# Patient Record
Sex: Male | Born: 1940 | Race: White | Hispanic: No | Marital: Married | Smoking: Former smoker
Health system: Southern US, Community
[De-identification: ages and names within clinical notes are randomized; demographics above are authoritative.]

## PROBLEM LIST (undated history)

## (undated) DIAGNOSIS — E859 Amyloidosis, unspecified: Secondary | ICD-10-CM

## (undated) DIAGNOSIS — I10 Essential (primary) hypertension: Secondary | ICD-10-CM

## (undated) DIAGNOSIS — Z7189 Other specified counseling: Secondary | ICD-10-CM

## (undated) DIAGNOSIS — C9000 Multiple myeloma not having achieved remission: Secondary | ICD-10-CM

## (undated) DIAGNOSIS — I639 Cerebral infarction, unspecified: Secondary | ICD-10-CM

## (undated) HISTORY — DX: Amyloidosis, unspecified: E85.9

## (undated) HISTORY — DX: Multiple myeloma not having achieved remission: C90.00

---
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
## 2002-08-19 DIAGNOSIS — I639 Cerebral infarction, unspecified: Secondary | ICD-10-CM

## 2002-08-19 HISTORY — DX: Cerebral infarction, unspecified: I63.9

## 2019-01-14 ENCOUNTER — Inpatient Hospital Stay (HOSPITAL_COMMUNITY)
Admission: AD | Admit: 2019-01-14 | Discharge: 2019-02-10 | DRG: 673 | Disposition: A | Discharge status: Rehabilitation Facility | Payer: Medicare Other | Source: Other Acute Inpatient Hospital | Attending: Family Medicine | Admitting: Family Medicine

## 2019-01-14 ENCOUNTER — Other Ambulatory Visit: Payer: Self-pay

## 2019-01-14 DIAGNOSIS — N179 Acute kidney failure, unspecified: Secondary | ICD-10-CM

## 2019-01-14 DIAGNOSIS — R932 Abnormal findings on diagnostic imaging of liver and biliary tract: Secondary | ICD-10-CM | POA: Diagnosis not present

## 2019-01-14 DIAGNOSIS — I953 Hypotension of hemodialysis: Secondary | ICD-10-CM | POA: Diagnosis not present

## 2019-01-14 DIAGNOSIS — K922 Gastrointestinal hemorrhage, unspecified: Secondary | ICD-10-CM

## 2019-01-14 DIAGNOSIS — Z419 Encounter for procedure for purposes other than remedying health state, unspecified: Secondary | ICD-10-CM

## 2019-01-14 DIAGNOSIS — R195 Other fecal abnormalities: Secondary | ICD-10-CM | POA: Diagnosis not present

## 2019-01-14 DIAGNOSIS — B379 Candidiasis, unspecified: Secondary | ICD-10-CM | POA: Diagnosis present

## 2019-01-14 DIAGNOSIS — K3189 Other diseases of stomach and duodenum: Secondary | ICD-10-CM | POA: Diagnosis present

## 2019-01-14 DIAGNOSIS — L8990 Pressure ulcer of unspecified site, unspecified stage: Secondary | ICD-10-CM | POA: Insufficient documentation

## 2019-01-14 DIAGNOSIS — J90 Pleural effusion, not elsewhere classified: Secondary | ICD-10-CM

## 2019-01-14 DIAGNOSIS — N2581 Secondary hyperparathyroidism of renal origin: Secondary | ICD-10-CM | POA: Diagnosis present

## 2019-01-14 DIAGNOSIS — N433 Hydrocele, unspecified: Secondary | ICD-10-CM | POA: Diagnosis present

## 2019-01-14 DIAGNOSIS — R739 Hyperglycemia, unspecified: Secondary | ICD-10-CM | POA: Diagnosis not present

## 2019-01-14 DIAGNOSIS — R571 Hypovolemic shock: Secondary | ICD-10-CM | POA: Diagnosis not present

## 2019-01-14 DIAGNOSIS — I69351 Hemiplegia and hemiparesis following cerebral infarction affecting right dominant side: Secondary | ICD-10-CM

## 2019-01-14 DIAGNOSIS — I361 Nonrheumatic tricuspid (valve) insufficiency: Secondary | ICD-10-CM | POA: Diagnosis not present

## 2019-01-14 DIAGNOSIS — Z01810 Encounter for preprocedural cardiovascular examination: Secondary | ICD-10-CM | POA: Diagnosis not present

## 2019-01-14 DIAGNOSIS — Z452 Encounter for adjustment and management of vascular access device: Secondary | ICD-10-CM

## 2019-01-14 DIAGNOSIS — T18128A Food in esophagus causing other injury, initial encounter: Secondary | ICD-10-CM | POA: Diagnosis not present

## 2019-01-14 DIAGNOSIS — X58XXXA Exposure to other specified factors, initial encounter: Secondary | ICD-10-CM | POA: Diagnosis not present

## 2019-01-14 DIAGNOSIS — D519 Vitamin B12 deficiency anemia, unspecified: Secondary | ICD-10-CM | POA: Diagnosis present

## 2019-01-14 DIAGNOSIS — K7460 Unspecified cirrhosis of liver: Secondary | ICD-10-CM | POA: Diagnosis not present

## 2019-01-14 DIAGNOSIS — I82601 Acute embolism and thrombosis of unspecified veins of right upper extremity: Secondary | ICD-10-CM | POA: Diagnosis not present

## 2019-01-14 DIAGNOSIS — I472 Ventricular tachycardia: Secondary | ICD-10-CM | POA: Diagnosis not present

## 2019-01-14 DIAGNOSIS — I471 Supraventricular tachycardia: Secondary | ICD-10-CM | POA: Diagnosis not present

## 2019-01-14 DIAGNOSIS — E8809 Other disorders of plasma-protein metabolism, not elsewhere classified: Secondary | ICD-10-CM

## 2019-01-14 DIAGNOSIS — N186 End stage renal disease: Secondary | ICD-10-CM | POA: Diagnosis not present

## 2019-01-14 DIAGNOSIS — R578 Other shock: Secondary | ICD-10-CM | POA: Diagnosis not present

## 2019-01-14 DIAGNOSIS — E559 Vitamin D deficiency, unspecified: Secondary | ICD-10-CM | POA: Diagnosis present

## 2019-01-14 DIAGNOSIS — N492 Inflammatory disorders of scrotum: Secondary | ICD-10-CM | POA: Diagnosis present

## 2019-01-14 DIAGNOSIS — I340 Nonrheumatic mitral (valve) insufficiency: Secondary | ICD-10-CM | POA: Diagnosis not present

## 2019-01-14 DIAGNOSIS — N184 Chronic kidney disease, stage 4 (severe): Secondary | ICD-10-CM | POA: Diagnosis not present

## 2019-01-14 DIAGNOSIS — D62 Acute posthemorrhagic anemia: Secondary | ICD-10-CM | POA: Diagnosis not present

## 2019-01-14 DIAGNOSIS — R778 Other specified abnormalities of plasma proteins: Secondary | ICD-10-CM

## 2019-01-14 DIAGNOSIS — I120 Hypertensive chronic kidney disease with stage 5 chronic kidney disease or end stage renal disease: Secondary | ICD-10-CM | POA: Diagnosis present

## 2019-01-14 DIAGNOSIS — R5381 Other malaise: Secondary | ICD-10-CM | POA: Diagnosis not present

## 2019-01-14 DIAGNOSIS — R579 Shock, unspecified: Secondary | ICD-10-CM

## 2019-01-14 DIAGNOSIS — E785 Hyperlipidemia, unspecified: Secondary | ICD-10-CM | POA: Diagnosis present

## 2019-01-14 DIAGNOSIS — R609 Edema, unspecified: Secondary | ICD-10-CM | POA: Diagnosis not present

## 2019-01-14 DIAGNOSIS — E876 Hypokalemia: Secondary | ICD-10-CM | POA: Diagnosis present

## 2019-01-14 DIAGNOSIS — I959 Hypotension, unspecified: Secondary | ICD-10-CM | POA: Diagnosis not present

## 2019-01-14 DIAGNOSIS — D696 Thrombocytopenia, unspecified: Secondary | ICD-10-CM | POA: Diagnosis present

## 2019-01-14 DIAGNOSIS — I2510 Atherosclerotic heart disease of native coronary artery without angina pectoris: Secondary | ICD-10-CM | POA: Diagnosis present

## 2019-01-14 DIAGNOSIS — L89621 Pressure ulcer of left heel, stage 1: Secondary | ICD-10-CM | POA: Diagnosis present

## 2019-01-14 DIAGNOSIS — E872 Acidosis: Secondary | ICD-10-CM | POA: Diagnosis present

## 2019-01-14 DIAGNOSIS — D7589 Other specified diseases of blood and blood-forming organs: Secondary | ICD-10-CM | POA: Diagnosis not present

## 2019-01-14 DIAGNOSIS — E2740 Unspecified adrenocortical insufficiency: Secondary | ICD-10-CM | POA: Diagnosis present

## 2019-01-14 DIAGNOSIS — J189 Pneumonia, unspecified organism: Secondary | ICD-10-CM | POA: Diagnosis present

## 2019-01-14 DIAGNOSIS — Z992 Dependence on renal dialysis: Secondary | ICD-10-CM

## 2019-01-14 DIAGNOSIS — M7989 Other specified soft tissue disorders: Secondary | ICD-10-CM | POA: Diagnosis not present

## 2019-01-14 DIAGNOSIS — Z20828 Contact with and (suspected) exposure to other viral communicable diseases: Secondary | ICD-10-CM | POA: Diagnosis present

## 2019-01-14 DIAGNOSIS — R0682 Tachypnea, not elsewhere classified: Secondary | ICD-10-CM | POA: Diagnosis not present

## 2019-01-14 DIAGNOSIS — Z8679 Personal history of other diseases of the circulatory system: Secondary | ICD-10-CM | POA: Diagnosis not present

## 2019-01-14 DIAGNOSIS — C9000 Multiple myeloma not having achieved remission: Secondary | ICD-10-CM | POA: Diagnosis present

## 2019-01-14 DIAGNOSIS — J9601 Acute respiratory failure with hypoxia: Secondary | ICD-10-CM

## 2019-01-14 DIAGNOSIS — N058 Unspecified nephritic syndrome with other morphologic changes: Secondary | ICD-10-CM | POA: Diagnosis not present

## 2019-01-14 DIAGNOSIS — R531 Weakness: Secondary | ICD-10-CM | POA: Diagnosis present

## 2019-01-14 DIAGNOSIS — K449 Diaphragmatic hernia without obstruction or gangrene: Secondary | ICD-10-CM | POA: Diagnosis present

## 2019-01-14 DIAGNOSIS — I82621 Acute embolism and thrombosis of deep veins of right upper extremity: Secondary | ICD-10-CM | POA: Diagnosis not present

## 2019-01-14 DIAGNOSIS — Z7189 Other specified counseling: Secondary | ICD-10-CM

## 2019-01-14 DIAGNOSIS — K921 Melena: Secondary | ICD-10-CM | POA: Diagnosis not present

## 2019-01-14 DIAGNOSIS — Z6832 Body mass index (BMI) 32.0-32.9, adult: Secondary | ICD-10-CM

## 2019-01-14 DIAGNOSIS — D72819 Decreased white blood cell count, unspecified: Secondary | ICD-10-CM | POA: Diagnosis present

## 2019-01-14 DIAGNOSIS — Z993 Dependence on wheelchair: Secondary | ICD-10-CM

## 2019-01-14 DIAGNOSIS — L89151 Pressure ulcer of sacral region, stage 1: Secondary | ICD-10-CM | POA: Diagnosis present

## 2019-01-14 DIAGNOSIS — E538 Deficiency of other specified B group vitamins: Secondary | ICD-10-CM | POA: Diagnosis present

## 2019-01-14 DIAGNOSIS — R0989 Other specified symptoms and signs involving the circulatory and respiratory systems: Secondary | ICD-10-CM | POA: Diagnosis not present

## 2019-01-14 DIAGNOSIS — Z87891 Personal history of nicotine dependence: Secondary | ICD-10-CM

## 2019-01-14 DIAGNOSIS — Z79899 Other long term (current) drug therapy: Secondary | ICD-10-CM

## 2019-01-14 DIAGNOSIS — R0600 Dyspnea, unspecified: Secondary | ICD-10-CM

## 2019-01-14 DIAGNOSIS — I6930 Unspecified sequelae of cerebral infarction: Secondary | ICD-10-CM

## 2019-01-14 DIAGNOSIS — N170 Acute kidney failure with tubular necrosis: Principal | ICD-10-CM | POA: Diagnosis present

## 2019-01-14 DIAGNOSIS — J811 Chronic pulmonary edema: Secondary | ICD-10-CM

## 2019-01-14 DIAGNOSIS — D631 Anemia in chronic kidney disease: Secondary | ICD-10-CM | POA: Diagnosis present

## 2019-01-14 DIAGNOSIS — I951 Orthostatic hypotension: Secondary | ICD-10-CM

## 2019-01-14 DIAGNOSIS — D684 Acquired coagulation factor deficiency: Secondary | ICD-10-CM | POA: Diagnosis not present

## 2019-01-14 DIAGNOSIS — Z978 Presence of other specified devices: Secondary | ICD-10-CM

## 2019-01-14 DIAGNOSIS — E669 Obesity, unspecified: Secondary | ICD-10-CM | POA: Diagnosis present

## 2019-01-14 DIAGNOSIS — R188 Other ascites: Secondary | ICD-10-CM | POA: Diagnosis present

## 2019-01-14 DIAGNOSIS — L89322 Pressure ulcer of left buttock, stage 2: Secondary | ICD-10-CM | POA: Clinically undetermined

## 2019-01-14 DIAGNOSIS — Z09 Encounter for follow-up examination after completed treatment for conditions other than malignant neoplasm: Secondary | ICD-10-CM

## 2019-01-14 DIAGNOSIS — N185 Chronic kidney disease, stage 5: Secondary | ICD-10-CM | POA: Diagnosis present

## 2019-01-14 DIAGNOSIS — R040 Epistaxis: Secondary | ICD-10-CM | POA: Diagnosis not present

## 2019-01-14 DIAGNOSIS — Z7982 Long term (current) use of aspirin: Secondary | ICD-10-CM

## 2019-01-14 DIAGNOSIS — D649 Anemia, unspecified: Secondary | ICD-10-CM | POA: Diagnosis not present

## 2019-01-14 DIAGNOSIS — L89514 Pressure ulcer of right ankle, stage 4: Secondary | ICD-10-CM | POA: Insufficient documentation

## 2019-01-14 DIAGNOSIS — T45515A Adverse effect of anticoagulants, initial encounter: Secondary | ICD-10-CM | POA: Diagnosis present

## 2019-01-14 DIAGNOSIS — E875 Hyperkalemia: Secondary | ICD-10-CM | POA: Diagnosis not present

## 2019-01-14 DIAGNOSIS — K260 Acute duodenal ulcer with hemorrhage: Secondary | ICD-10-CM | POA: Diagnosis not present

## 2019-01-14 DIAGNOSIS — Z4659 Encounter for fitting and adjustment of other gastrointestinal appliance and device: Secondary | ICD-10-CM

## 2019-01-14 DIAGNOSIS — Z8719 Personal history of other diseases of the digestive system: Secondary | ICD-10-CM | POA: Diagnosis not present

## 2019-01-14 DIAGNOSIS — T380X5A Adverse effect of glucocorticoids and synthetic analogues, initial encounter: Secondary | ICD-10-CM | POA: Diagnosis not present

## 2019-01-14 DIAGNOSIS — R198 Other specified symptoms and signs involving the digestive system and abdomen: Secondary | ICD-10-CM | POA: Diagnosis not present

## 2019-01-14 DIAGNOSIS — D692 Other nonthrombocytopenic purpura: Secondary | ICD-10-CM | POA: Diagnosis present

## 2019-01-14 DIAGNOSIS — K2970 Gastritis, unspecified, without bleeding: Secondary | ICD-10-CM | POA: Diagnosis present

## 2019-01-14 DIAGNOSIS — D539 Nutritional anemia, unspecified: Secondary | ICD-10-CM | POA: Diagnosis present

## 2019-01-14 DIAGNOSIS — L89611 Pressure ulcer of right heel, stage 1: Secondary | ICD-10-CM | POA: Diagnosis present

## 2019-01-14 DIAGNOSIS — J181 Lobar pneumonia, unspecified organism: Secondary | ICD-10-CM | POA: Diagnosis not present

## 2019-01-14 HISTORY — DX: Essential (primary) hypertension: I10

## 2019-01-15 ENCOUNTER — Inpatient Hospital Stay (HOSPITAL_COMMUNITY): Payer: Medicare Other

## 2019-01-15 ENCOUNTER — Encounter (HOSPITAL_COMMUNITY): Payer: Self-pay | Admitting: Internal Medicine

## 2019-01-15 DIAGNOSIS — I361 Nonrheumatic tricuspid (valve) insufficiency: Secondary | ICD-10-CM

## 2019-01-15 DIAGNOSIS — J181 Lobar pneumonia, unspecified organism: Secondary | ICD-10-CM

## 2019-01-15 DIAGNOSIS — R188 Other ascites: Secondary | ICD-10-CM | POA: Diagnosis present

## 2019-01-15 DIAGNOSIS — J189 Pneumonia, unspecified organism: Secondary | ICD-10-CM

## 2019-01-15 DIAGNOSIS — R778 Other specified abnormalities of plasma proteins: Secondary | ICD-10-CM

## 2019-01-15 DIAGNOSIS — I340 Nonrheumatic mitral (valve) insufficiency: Secondary | ICD-10-CM

## 2019-01-15 DIAGNOSIS — D519 Vitamin B12 deficiency anemia, unspecified: Secondary | ICD-10-CM

## 2019-01-15 DIAGNOSIS — E875 Hyperkalemia: Secondary | ICD-10-CM

## 2019-01-15 DIAGNOSIS — J90 Pleural effusion, not elsewhere classified: Secondary | ICD-10-CM | POA: Diagnosis present

## 2019-01-15 LAB — CBC
HCT: 22.5 % — ABNORMAL LOW (ref 39.0–52.0)
Hemoglobin: 7.3 g/dL — ABNORMAL LOW (ref 13.0–17.0)
MCH: 34.4 pg — ABNORMAL HIGH (ref 26.0–34.0)
MCHC: 32.4 g/dL (ref 30.0–36.0)
MCV: 106.1 fL — ABNORMAL HIGH (ref 80.0–100.0)
Platelets: 101 10*3/uL — ABNORMAL LOW (ref 150–400)
RBC: 2.12 MIL/uL — ABNORMAL LOW (ref 4.22–5.81)
RDW: 17 % — ABNORMAL HIGH (ref 11.5–15.5)
WBC: 7.2 10*3/uL (ref 4.0–10.5)
nRBC: 0.3 % — ABNORMAL HIGH (ref 0.0–0.2)

## 2019-01-15 LAB — COMPREHENSIVE METABOLIC PANEL
ALT: 23 U/L (ref 0–44)
AST: 23 U/L (ref 15–41)
Albumin: 4.1 g/dL (ref 3.5–5.0)
Alkaline Phosphatase: 63 U/L (ref 38–126)
Anion gap: 9 (ref 5–15)
BUN: 78 mg/dL — ABNORMAL HIGH (ref 8–23)
CO2: 21 mmol/L — ABNORMAL LOW (ref 22–32)
Calcium: 11 mg/dL — ABNORMAL HIGH (ref 8.9–10.3)
Chloride: 106 mmol/L (ref 98–111)
Creatinine, Ser: 2.44 mg/dL — ABNORMAL HIGH (ref 0.61–1.24)
GFR calc Af Amer: 28 mL/min — ABNORMAL LOW (ref >60)
GFR calc non Af Amer: 24 mL/min — ABNORMAL LOW (ref >60)
Glucose, Bld: 95 mg/dL (ref 70–99)
Potassium: 5.1 mmol/L (ref 3.5–5.1)
Sodium: 136 mmol/L (ref 135–145)
Total Bilirubin: 1.6 mg/dL — ABNORMAL HIGH (ref 0.3–1.2)
Total Protein: 6.4 g/dL — ABNORMAL LOW (ref 6.5–8.1)

## 2019-01-15 LAB — IRON AND TIBC
Iron: 64 ug/dL (ref 45–182)
Saturation Ratios: 20 % (ref 17.9–39.5)
TIBC: 314 ug/dL (ref 250–450)
UIBC: 250 ug/dL

## 2019-01-15 LAB — PROTEIN / CREATININE RATIO, URINE
Creatinine, Urine: 129.93 mg/dL
Protein Creatinine Ratio: 0.8 mg/mg{Cre} — ABNORMAL HIGH (ref 0.00–0.15)
Total Protein, Urine: 104 mg/dL

## 2019-01-15 LAB — TSH: TSH: 5.402 u[IU]/mL — ABNORMAL HIGH (ref 0.350–4.500)

## 2019-01-15 LAB — ECHOCARDIOGRAM COMPLETE
Height: 74 in
Weight: 3689.62 oz

## 2019-01-15 LAB — MAGNESIUM: Magnesium: 2.8 mg/dL — ABNORMAL HIGH (ref 1.7–2.4)

## 2019-01-15 LAB — SARS CORONAVIRUS 2 BY RT PCR (HOSPITAL ORDER, PERFORMED IN ~~LOC~~ HOSPITAL LAB): SARS Coronavirus 2: NEGATIVE

## 2019-01-15 LAB — SODIUM, URINE, RANDOM: Sodium, Ur: 29 mmol/L

## 2019-01-15 LAB — MRSA PCR SCREENING: MRSA by PCR: NEGATIVE

## 2019-01-15 LAB — STREP PNEUMONIAE URINARY ANTIGEN: Strep Pneumo Urinary Antigen: NEGATIVE

## 2019-01-15 LAB — FERRITIN: Ferritin: 358 ng/mL — ABNORMAL HIGH (ref 24–336)

## 2019-01-15 LAB — TROPONIN I
Troponin I: 0.06 ng/mL (ref <0.03)
Troponin I: 0.05 ng/mL (ref <0.03)

## 2019-01-15 LAB — VITAMIN B12: Vitamin B-12: 120 pg/mL — ABNORMAL LOW (ref 180–914)

## 2019-01-15 LAB — PHOSPHORUS: Phosphorus: 4.2 mg/dL (ref 2.5–4.6)

## 2019-01-15 MED ORDER — VANCOMYCIN HCL 10 G IV SOLR
2000.0000 mg | Freq: Once | INTRAVENOUS | Status: DC
  Filled 2019-01-15: qty 2000

## 2019-01-15 MED ORDER — SODIUM CHLORIDE 0.9 % IV SOLN
INTRAVENOUS | Status: AC
  Administered 2019-01-15: 02:00:00 via INTRAVENOUS

## 2019-01-15 MED ORDER — NYSTATIN 100000 UNIT/GM EX POWD
Freq: Three times a day (TID) | CUTANEOUS | Status: DC
  Administered 2019-01-15 – 2019-02-10 (×71): via TOPICAL
  Filled 2019-01-15 (×2): qty 15

## 2019-01-15 MED ORDER — CYANOCOBALAMIN 500 MCG PO TABS
250.0000 ug | ORAL_TABLET | Freq: Every day | ORAL | Status: DC
  Administered 2019-01-15 – 2019-01-20 (×6): 250 ug via ORAL
  Filled 2019-01-15 (×8): qty 1

## 2019-01-15 MED ORDER — SODIUM BICARBONATE 8.4 % IV SOLN
50.0000 meq | Freq: Once | INTRAVENOUS | Status: AC
  Administered 2019-01-15: 50 meq via INTRAVENOUS
  Filled 2019-01-15: qty 50

## 2019-01-15 MED ORDER — VANCOMYCIN VARIABLE DOSE PER UNSTABLE RENAL FUNCTION (PHARMACIST DOSING): Status: DC

## 2019-01-15 MED ORDER — SODIUM CHLORIDE 0.9 % IV SOLN
500.0000 mg | Freq: Every day | INTRAVENOUS | Status: AC
  Administered 2019-01-15 – 2019-01-17 (×3): 500 mg via INTRAVENOUS
  Filled 2019-01-15 (×3): qty 500

## 2019-01-15 MED ORDER — SODIUM CHLORIDE 0.9 % IV SOLN
2.0000 g | Freq: Two times a day (BID) | INTRAVENOUS | Status: DC
  Administered 2019-01-15 – 2019-01-16 (×3): 2 g via INTRAVENOUS
  Filled 2019-01-15 (×4): qty 2

## 2019-01-15 MED ORDER — SODIUM POLYSTYRENE SULFONATE 15 GM/60ML PO SUSP
30.0000 g | Freq: Once | ORAL | Status: AC
  Administered 2019-01-15: 02:00:00 30 g via ORAL
  Filled 2019-01-15: qty 120

## 2019-01-15 MED ORDER — SODIUM CHLORIDE 0.9 % IV SOLN
2.0000 g | Freq: Once | INTRAVENOUS | Status: DC
  Filled 2019-01-15: qty 2

## 2019-01-15 NOTE — Plan of Care (Signed)

## 2019-01-15 NOTE — Progress Notes (Signed)
Echocardiogram 2D Echocardiogram has been performed.  Matilde Bash 01/15/2019, 12:07 PM

## 2019-01-15 NOTE — Progress Notes (Signed)
PROGRESS NOTE    Scott Mcdowell  HQI:[id number] DOB: [birth year] DOA: 01/14/2019 PCP: Patient, No Pcp Per   Brief Narrative:  Scott Mcdowell  is a 78 year old gentleman with a history of CVA, hyperlipidemia and hypertension Was seen by his PCP on 01/14/2019 for scrotal swelling and was sent to the ER for further evaluation for possible cellulitis.  Patient was diagnosed with scrotal cellulitis, bilateral hydrocele, right pleural effusion and acute kidney injury And right lower lobe pneumonia/consolidation so he was transferred under hospitalist service at [facility] and was started on antibiotics.  Consultants:   None  Procedures:   None  Antimicrobials:   Cefepime and vancomycin started on 01/14/2019   Subjective: Patient seen and examined earlier today.  He denied any scrotal pain however his main concern was swelling.  He did not have any other complaint.  Denied any cough, fever, shortness of breath.  Objective: Vitals:   01/14/19 2201 01/15/19 0022 01/15/19 0616 01/15/19 1401  BP: (!) 109/57  93/63 (!) 88/66  Pulse: 91  82 91  Resp: 18  18 18   Temp: 98 F (36.7 C)  97.7 F (36.5 C) 98.1 F (36.7 C)  TempSrc: Oral  Oral Oral  SpO2: 100%  100% 96%  Weight:  104.6 kg    Height: 6\' 2"  (1.88 m)       Intake/Output Summary (Last 24 hours) at 01/15/2019 1504 Last data filed at 01/15/2019 1214 Gross per 24 hour  Intake 898.28 ml  Output 376 ml  Net 522.28 ml   Filed Weights   01/15/19 0022  Weight: 104.6 kg    Examination:  General exam: Appears calm and comfortable  Respiratory system: Diminished breath sounds in the right middle and lower lobe. Respiratory effort normal. Cardiovascular system: S1 & S2 heard, RRR. No JVD, murmurs, rubs, gallops or clicks. No pedal edema. Gastrointestinal system: Abdomen is nondistended, soft and nontender. No organomegaly or masses felt. Normal bowel sounds heard.  Scrotal edema with erythema, mostly on the  right side.  Signs of fungal infection around the scrotum. Central nervous system: Alert and oriented. No focal neurological deficits. Extremities: Symmetric 5 x 5 power. Skin: No rashes, lesions or ulcers Psychiatry: Judgement and insight appear normal. Mood & affect appropriate.    Data Reviewed: I have personally reviewed following labs and imaging studies  CBC: Recent Labs  Lab 01/15/19 0134  WBC 7.2  HGB 7.3*  HCT 22.5*  MCV 106.1*  PLT 132*   Basic Metabolic Panel: Recent Labs  Lab 01/15/19 0134  NA 136  K 5.1  CL 106  CO2 21*  GLUCOSE 95  BUN 78*  CREATININE 2.44*  CALCIUM 11.0*  MG 2.8*  PHOS 4.2   GFR: Estimated Creatinine Clearance: 31.7 mL/min (A) (by C-G formula based on SCr of 2.44 mg/dL (H)). Liver Function Tests: Recent Labs  Lab 01/15/19 0134  AST 23  ALT 23  ALKPHOS 63  BILITOT 1.6*  PROT 6.4*  ALBUMIN 4.1   No results for input(s): LIPASE, AMYLASE in the last 168 hours. No results for input(s): AMMONIA in the last 168 hours. Coagulation Profile: No results for input(s): INR, PROTIME in the last 168 hours. Cardiac Enzymes: Recent Labs  Lab 01/15/19 0134 01/15/19 1242  TROPONINI 0.06* 0.05*   BNP (last 3 results) No results for input(s): PROBNP in the last 8760 hours. HbA1C: No results for input(s): HGBA1C in the last 72 hours. CBG: No results for input(s): GLUCAP in the last 168 hours.  Lipid Profile: No results for input(s): CHOL, HDL, LDLCALC, TRIG, CHOLHDL, LDLDIRECT in the last 72 hours. Thyroid Function Tests: Recent Labs    01/15/19 0134  TSH 5.402*   Anemia Panel: Recent Labs    01/15/19 0134  VITAMINB12 120*  FERRITIN 358*  TIBC 314  IRON 64   Sepsis Labs: No results for input(s): PROCALCITON, LATICACIDVEN in the last 168 hours.  No results found for this or any previous visit (from the past 240 hour(s)).    Radiology Studies: No results found.  Scheduled Meds:  nystatin   Topical TID   vancomycin  variable dose per unstable renal function (pharmacist dosing)   Does not apply See admin instructions   vitamin B-12  250 mcg Oral Daily   Continuous Infusions:  azithromycin 500 mg (01/15/19 0236)   ceFEPime (MAXIPIME) IV 2 g (01/15/19 0618)     LOS: 1 day   Assessment & Plan:   Principal Problem:   ARF (acute renal failure) (HCC) Active Problems:   Hypercalcemia   Hyperkalemia   B12 deficiency anemia   Pleural effusion on right   Ascites   CAP (community acquired pneumonia)   Elevated troponin  Scrotal cellulitis/bilateral hydrocele: Continue cefepime and vancomycin.  Community acquired pneumonia of right lower lobe/right pleural effusion: Diagnostic and therapeutic thoracentesis has been ordered.  Per IR, patient needs COVID test done before procedure could be done.  Cover test has been ordered, waiting for the test to be done and resulted.  Continue cefepime and vancomycin in the meantime.  Inguinal/scrotal fungal/Candida infection: None statin powder 3 times daily.  Acute kidney injury: Could be due to infection.  Gentle IV hydration.  Avoiding nephrotoxic agent.  Repeat labs in the morning.  Right-sided weakness/history of stroke: Continue aspirin and Lipitor.  Macrocytic anemia due to B12 deficiency: Hemoglobin over 7.  Has B12 deficiency.  Will start on B12 supplement and repeat labs in the morning.  Elevated troponin: Slight elevation, likely due to AKI.  No signs of ACS.  Hyperkalemia: Resolved.  Hypercalcemia: Continue hydration and repeat labs in the morning.  DVT prophylaxis: SCD.  Will start on Lovenox once thoracentesis done. Code Status: Full code Family Communication: Discussed with patient Disposition Plan: To be determined pending clinical improvement   Time spent: 40 minutes   Darliss Cheney, MD [facility] Pager [phone]  If 7PM-7AM, please contact night-coverage [email or web address] Password [facility] 01/15/2019, 3:04 PM

## 2019-01-15 NOTE — H&P (Addendum)
TRH H&P    Patient Demographics:    Scott Mcdowell, is a 78 y.o. male  MRN: [id number]  DOB - 03-24-1941  Admit Date - 01/14/2019  Referring MD/NP/PA:   ? Ma Hillock  Outpatient Primary MD for the patient is Patient, No Pcp Per  Patient coming from:  [facility]  Chief complaint- ARF   HPI:    Scott Mcdowell  is a 78 y.o. male, w CVA, apparently seen by pcp today, and sent to ER for evaluation of scrotal swelling, and ? Cellulitis.  Pt notes sob for a few weeks. Pt denies fever, chills, cough, cp, palp, n/v, abd pain, diarrhea, brbpr, black stool, dysuria.   Pt went to [facility] ER and sent to [facility] for evaluation.   In ED T 98.1  P 85 Bp 101/58  Pox 95% R 18 Wt 235lbs  Wbc 7.7, Hgb 8.1, Plt 119  (+ bandemia)  Na 137, K 5.6,  Bun 80, Creatinine 2.5 Calcium 11.6 BNP 13200 Ast 30, Alt 26  Trop 0.07 (high)  EKG nsr at 80, nl axis,  Nl int, poor quality.   CT chest/abd/ pelvis Impression Moderate R pleural effusion associated with atelectasis or consolidation.  Mild left basilar scarring of atelectasis  Scrotal edema and bilateral hydroceles.  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   Extensive 3 vessel coronary artery calcifications and aortic valve calcifications .  Testicular ultrasound Impression No evidence of testisticular mass, no derate bilateral hydroceles are noted.  Extensive scrotal wall thickening, suggesting edema/ inflammation.   procalcitonin 0.07 (high)  SARS negative (cepheid)  Pt  will be admitted for CAP, w right pleural effusion, bilateral hydrocele, asciates, and hypercalcemia, and  ARF and troponin elevation.      Review of systems:    In addition to the HPI above,  No Fever-chills, No Headache, No changes with Vision or hearing, No problems swallowing food or Liquids, No Chest pain, Cough or Shortness of Breath, No Abdominal pain, No Nausea or Vomiting, + constipation No Blood in stool or Urine, No dysuria, No new skin rashes or bruises, No new joints pains-aches,  No new weakness, tingling, numbness in any extremity, No recent weight gain or loss, No polyuria, polydypsia or polyphagia, No significant Mental Stressors.  All other systems reviewed and are negative.    Past History of the following :    Past Medical History:  Diagnosis Date  . Stroke [facility])    w right sided weakness      History reviewed. No pertinent surgical history. None per patient   Social History:      Social History   Tobacco Use  . Smoking status: Former Smoker  Types: Cigarettes  . Smokeless tobacco: Never Used  Substance Use Topics  . Alcohol use: Yes    Alcohol/week: 5.0 standard drinks    Types: 5 Cans of beer per week       Family History :     Family History  Problem Relation Age of Onset  . Cancer Mother   . Parkinson's disease Father        Home Medications:   Prior to Admission medications   Medication Sig Start Date End Date Taking? Authorizing Provider  aspirin 81 MG chewable tablet Chew 81 mg by mouth daily.   Yes [provider]  atorvastatin (LIPITOR) 10 MG tablet Take 10 mg by mouth daily. 11/12/18  Yes [provider]  ramipril (ALTACE) 2.5 MG capsule Take 2.5 mg by mouth daily. 11/12/18  Yes [provider]     Allergies:    No Known Allergies   Physical Exam:   Vitals  Blood pressure (!) 109/57, pulse 91, temperature 98 F (36.7 C), temperature source Oral, resp. rate 18, height 6\' 2"  (1.88  m), weight 104.6 kg, SpO2 100 %.  1.  General: axox3  2. Psychiatric: euthymic  3. Neurologic: cn2-12 intact, reflexes 2+s ymmetric, diffuse with no clonus, right upper ext 5-/5, right lower ext 5-/5,  Pt walks with cane.  Left upper and lower ext 5/5  4. HEENMT:  Anicteric, pupils 1.1mm symmetric, direct, consensual, near intact Mucous membranes dry  5. Respiratory : Decrease bs right base, crackles left lung base  6. Cardiovascular : rrr s1, s2 2/6 sem rusb / apex  7. Gastrointestinal:  Abd: soft, nt, nd, +bs  8. Skin:  Ext: no c/c/e,  Slight redness over scrotum, (minimal), + bilateral scrotal swelling c/w hydrocele  9.Musculoskeletal:  Good ROM,  No adenopathy    Data Review:    CBC No results for input(s): WBC, HGB, HCT, PLT, MCV, MCH, MCHC, RDW, LYMPHSABS, MONOABS, EOSABS, BASOSABS, BANDABS in the last 168 hours.  Invalid input(s): NEUTRABS, BANDSABD ------------------------------------------------------------------------------------------------------------------  No results found for this or any previous visit (from the past 18 hour(s)).  Chemistries  No results for input(s): NA, K, CL, CO2, GLUCOSE, BUN, CREATININE, CALCIUM, MG, AST, ALT, ALKPHOS, BILITOT in the last 168 hours.  Invalid input(s): GFRCGP ------------------------------------------------------------------------------------------------------------------  ------------------------------------------------------------------------------------------------------------------ GFR: CrCl cannot be calculated (No successful lab value found.). Liver Function Tests: No results for input(s): AST, ALT, ALKPHOS, BILITOT, PROT, ALBUMIN in the last 168 hours. No results for input(s): LIPASE, AMYLASE in the last 168 hours. No results for input(s): AMMONIA in the last 168 hours. Coagulation Profile: No results for input(s): INR, PROTIME in the last 168 hours. Cardiac Enzymes: No results for input(s): CKTOTAL,  CKMB, CKMBINDEX, TROPONINI in the last 168 hours. BNP (last 3 results) No results for input(s): PROBNP in the last 8760 hours. HbA1C: No results for input(s): HGBA1C in the last 72 hours. CBG: No results for input(s): GLUCAP in the last 168 hours. Lipid Profile: No results for input(s): CHOL, HDL, LDLCALC, TRIG, CHOLHDL, LDLDIRECT in the last 72 hours. Thyroid Function Tests: No results for input(s): TSH, T4TOTAL, FREET4, T3FREE, THYROIDAB in the last 72 hours. Anemia Panel: No results for input(s): VITAMINB12, FOLATE, FERRITIN, TIBC, IRON, RETICCTPCT in the last 72 hours.  --------------------------------------------------------------------------------------------------------------- Urine analysis: No results found for: COLORURINE, APPEARANCEUR, LABSPEC, PHURINE, GLUCOSEU, HGBUR, BILIRUBINUR, KETONESUR, PROTEINUR, UROBILINOGEN, NITRITE, LEUKOCYTESUR    Imaging Results:    No results found.     Assessment & Plan:    Principal  Problem:   ARF (acute renal failure) (HCC) Active Problems:   Hypercalcemia   Hyperkalemia   Anemia   Pleural effusion   Ascites   CAP Blood culture x2 Sputum gram stain culture Check urine legionella, urine strep antigen Start on vanco, cefepime, iv pharmacy to dose,  zithromax 500mg  iv qday Cont O2 [place] to maintain o2 sat>90%  R pleural effusion Ultrasound guided thoracentesis  ARF Check urine sodium, urine creatinine urine eosinophils STOP Ramipril Hydrate with ns IV  Hypercalcemia Check magnesium, phos, vitamin d 25-oh, vitamin d 1,25-oh,  Pth, Pth rp,  Spep, immunofixation  Anemia Check iron, tibc, ferritin, b12, folate, tsh, spep, upep Check cbc in am  Hyperkalemia No Calcium gluconate due to hypercalcemia Sodium bicarbonate Kayexalate 30gm po x1,  Risk and benefit discussed with patient.  Check cmp in am  Ascites, unclear etiology  H/o Stroke with R sided weakness Cont Aspirin  Cont Lipitor  Elevated troponin ? Renal  insufficiency Pt probably has underlying coronary disease as evinced by coronary artery calcifications on CT scan Tele Trop I q6hx3 Check cardiac echo Consider cardiology consultation  Bilateral hydrocele Consider urology consultation in am vs outpatient followup     DVT Prophylaxis-   Lovenox - SCDs   AM Labs Ordered, also please review Full Orders  Family Communication: Admission, patients condition and plan of care including tests being ordered have been discussed with the patient  who indicate understanding and agree with the plan and Code Status.  Code Status:  FULL CODE  Admission status: Inpatient: Based on patients clinical presentation and evaluation of above clinical data, I have made determination that patient meets Inpatient criteria at this time.  Pt will require iv abx for CAP as well as cellulitis of scrotum.  Pt requires admission and inpatient stay due to inability to maintain his o2 sat >90% without o2  as well as tachypnea, and age.  Pt also requires thoracentesis / evaluation of pleural effusion. This will require > 2 nites stay and inpatient status.   Time spent in minutes : 70   Jani Gravel M.D on 01/15/2019 at 12:41 AM

## 2019-01-15 NOTE — Progress Notes (Signed)
Pharmacy Antibiotic Note  Scott Mcdowell is a 78 y.o. male admitted on 01/14/2019 with pneumonia, cellulitis, right pleural effesion  Pharmacy has been consulted for cefepime and vancomycin dosing.  0145 called [facility] and spoke the RPh who confirmed patient received Cefepime 2 Gm @ 1216 and Vancomycin 2 Gm @ 1233.  His scr was 2.5.    Plan: Cefepime 2 Gm IV q12h Zmax 500 mg IV q24h Vancomycin 2 Gm x1 (at [place])  F/u scr/cultures/levels  Height: 6\' 2"  (188 cm) Weight: 230 lb 9.6 oz (104.6 kg) IBW/kg (Calculated) : 82.2  Temp (24hrs), Avg:98 F (36.7 C), Min:98 F (36.7 C), Max:98 F (36.7 C)  No results for input(s): WBC, CREATININE, LATICACIDVEN, VANCOTROUGH, VANCOPEAK, VANCORANDOM, GENTTROUGH, GENTPEAK, GENTRANDOM, TOBRATROUGH, TOBRAPEAK, TOBRARND, AMIKACINPEAK, AMIKACINTROU, AMIKACIN in the last 168 hours.  CrCl cannot be calculated (No successful lab value found.).    No Known Allergies  Antimicrobials this admission: 5/28 cefepime >>  5/28 vancomycin >>  5/29 zmax >>  Dose adjustments this admission:   Microbiology results:  BCx:   UCx:    Sputum:    MRSA PCR:   Thank you for allowing pharmacy to be a part of this patient's care.  Dorrene German 01/15/2019 1:46 AM

## 2019-01-16 ENCOUNTER — Inpatient Hospital Stay (HOSPITAL_COMMUNITY): Payer: Medicare Other

## 2019-01-16 LAB — BODY FLUID CELL COUNT WITH DIFFERENTIAL
Lymphs, Fluid: 43 %
Monocyte-Macrophage-Serous Fluid: 38 % — ABNORMAL LOW (ref 50–90)
Neutrophil Count, Fluid: 18 % (ref 0–25)
Total Nucleated Cell Count, Fluid: 217 cu mm (ref 0–1000)

## 2019-01-16 LAB — CBC WITH DIFFERENTIAL/PLATELET
Abs Immature Granulocytes: 0.47 10*3/uL — ABNORMAL HIGH (ref 0.00–0.07)
Basophils Absolute: 0 10*3/uL (ref 0.0–0.1)
Basophils Relative: 0 %
Eosinophils Absolute: 0.1 10*3/uL (ref 0.0–0.5)
Eosinophils Relative: 1 %
HCT: 22.2 % — ABNORMAL LOW (ref 39.0–52.0)
Hemoglobin: 6.9 g/dL — CL (ref 13.0–17.0)
Immature Granulocytes: 7 %
Lymphocytes Relative: 17 %
Lymphs Abs: 1.3 10*3/uL (ref 0.7–4.0)
MCH: 33.2 pg (ref 26.0–34.0)
MCHC: 31.1 g/dL (ref 30.0–36.0)
MCV: 106.7 fL — ABNORMAL HIGH (ref 80.0–100.0)
Monocytes Absolute: 0.9 10*3/uL (ref 0.1–1.0)
Monocytes Relative: 12 %
Neutro Abs: 4.6 10*3/uL (ref 1.7–7.7)
Neutrophils Relative %: 63 %
Platelets: 105 10*3/uL — ABNORMAL LOW (ref 150–400)
RBC: 2.08 MIL/uL — ABNORMAL LOW (ref 4.22–5.81)
RDW: 17.2 % — ABNORMAL HIGH (ref 11.5–15.5)
WBC: 7.3 10*3/uL (ref 4.0–10.5)
nRBC: 0.6 % — ABNORMAL HIGH (ref 0.0–0.2)

## 2019-01-16 LAB — RESPIRATORY PANEL BY PCR

## 2019-01-16 LAB — LACTATE DEHYDROGENASE, PLEURAL OR PERITONEAL FLUID: LD, Fluid: 88 U/L — ABNORMAL HIGH (ref 3–23)

## 2019-01-16 LAB — TROPONIN I
Troponin I: 0.07 ng/mL (ref <0.03)
Troponin I: 0.06 ng/mL (ref <0.03)

## 2019-01-16 LAB — ALBUMIN, PLEURAL OR PERITONEAL FLUID: Albumin, Fluid: 2 g/dL

## 2019-01-16 LAB — VANCOMYCIN, RANDOM: Vancomycin Rm: 13

## 2019-01-16 LAB — PREPARE RBC (CROSSMATCH)

## 2019-01-16 LAB — COMPREHENSIVE METABOLIC PANEL
ALT: 23 U/L (ref 0–44)
AST: 25 U/L (ref 15–41)
Albumin: 3.9 g/dL (ref 3.5–5.0)
Alkaline Phosphatase: 59 U/L (ref 38–126)
Anion gap: 9 (ref 5–15)
BUN: 82 mg/dL — ABNORMAL HIGH (ref 8–23)
CO2: 22 mmol/L (ref 22–32)
Calcium: 11.1 mg/dL — ABNORMAL HIGH (ref 8.9–10.3)
Chloride: 107 mmol/L (ref 98–111)
Creatinine, Ser: 2.61 mg/dL — ABNORMAL HIGH (ref 0.61–1.24)
GFR calc Af Amer: 26 mL/min — ABNORMAL LOW (ref >60)
GFR calc non Af Amer: 22 mL/min — ABNORMAL LOW (ref >60)
Glucose, Bld: 145 mg/dL — ABNORMAL HIGH (ref 70–99)
Potassium: 4.3 mmol/L (ref 3.5–5.1)
Sodium: 138 mmol/L (ref 135–145)
Total Bilirubin: 1.2 mg/dL (ref 0.3–1.2)
Total Protein: 6.4 g/dL — ABNORMAL LOW (ref 6.5–8.1)

## 2019-01-16 LAB — GLUCOSE, PLEURAL OR PERITONEAL FLUID: Glucose, Fluid: 122 mg/dL

## 2019-01-16 LAB — PROTEIN, PLEURAL OR PERITONEAL FLUID: Total protein, fluid: <3 g/dL

## 2019-01-16 LAB — HIV ANTIBODY (ROUTINE TESTING W REFLEX): HIV Screen 4th Generation wRfx: NONREACTIVE

## 2019-01-16 LAB — PARATHYROID HORMONE, INTACT (NO CA): PTH: 8 pg/mL — ABNORMAL LOW (ref 15–65)

## 2019-01-16 LAB — MAGNESIUM: Magnesium: 2.8 mg/dL — ABNORMAL HIGH (ref 1.7–2.4)

## 2019-01-16 LAB — ABO/RH: ABO/RH(D): O POS

## 2019-01-16 LAB — VITAMIN D 25 HYDROXY (VIT D DEFICIENCY, FRACTURES): Vit D, 25-Hydroxy: 12 ng/mL — ABNORMAL LOW (ref 30.0–100.0)

## 2019-01-16 MED ORDER — ASPIRIN 81 MG PO CHEW
81.0000 mg | CHEWABLE_TABLET | Freq: Every day | ORAL | Status: DC
  Administered 2019-01-16 – 2019-01-20 (×5): 81 mg via ORAL
  Filled 2019-01-16 (×5): qty 1

## 2019-01-16 MED ORDER — SODIUM CHLORIDE 0.9% IV SOLUTION
Freq: Once | INTRAVENOUS | Status: AC
  Administered 2019-01-16: 23:00:00 via INTRAVENOUS

## 2019-01-16 MED ORDER — SODIUM CHLORIDE 0.9% IV SOLUTION: Freq: Once | INTRAVENOUS | Status: DC

## 2019-01-16 MED ORDER — VANCOMYCIN HCL 10 G IV SOLR
2000.0000 mg | INTRAVENOUS | Status: DC
  Administered 2019-01-16: 2000 mg via INTRAVENOUS
  Filled 2019-01-16 (×2): qty 2000

## 2019-01-16 MED ORDER — ATORVASTATIN CALCIUM 10 MG PO TABS
10.0000 mg | ORAL_TABLET | Freq: Every day | ORAL | Status: DC
  Administered 2019-01-16 – 2019-01-20 (×5): 10 mg via ORAL
  Filled 2019-01-16 (×5): qty 1

## 2019-01-16 MED ORDER — LIDOCAINE HCL 1 % IJ SOLN
INTRAMUSCULAR | Status: AC
  Filled 2019-01-16: qty 10

## 2019-01-16 MED ORDER — SODIUM CHLORIDE 0.9 % IV SOLN
2.0000 g | INTRAVENOUS | Status: DC
  Administered 2019-01-17: 2 g via INTRAVENOUS
  Filled 2019-01-16: qty 2

## 2019-01-16 NOTE — Progress Notes (Addendum)
Pharmacy Antibiotic Note  Scott Mcdowell is a 78 y.o. male admitted on 01/14/2019 with pneumonia, cellulitis, right pleural effusion  Pharmacy has been consulted for cefepime and vancomycin dosing. RPh called [facility] and spoke the [place] who confirmed patient received Cefepime 2 Gm @ 1216 and Vancomycin 2 Gm @ 1233 on 5/28.  His scr was 2.5.    Today, 01/16/19  Day 2 Abxs  Afebrile  Random Vanc level 13 at 0552 - last dose of 2g given 5/28 at 1233 - ~42 hours between dose and level  Plan: 1) Change cefepime from 2g IV q12 to 2g IV q24 for increased SCr CrCl 29.6 ml/min 2) Start vancomycin 2g IV q24 - goal AUC 400-550 3) F/u scr/cultures/levels  Height: 6\' 2"  (188 cm) Weight: 230 lb 9.6 oz (104.6 kg) IBW/kg (Calculated) : 82.2  Temp (24hrs), Avg:98.1 F (36.7 C), Min:97.9 F (36.6 C), Max:98.4 F (36.9 C)  Recent Labs  Lab 01/15/19 0134 01/16/19 0552  WBC 7.2  --   CREATININE 2.44*  --   VANCORANDOM  --  13    Estimated Creatinine Clearance: 31.7 mL/min (A) (by C-G formula based on SCr of 2.44 mg/dL (H)).    No Known Allergies  Antimicrobials this admission: 5/28 cefepime >>  5/28 vancomycin >>  5/29 zmax >> 5/31  Dose adjustments this admission:   Microbiology results: 5/29 BCx: sent 5/29 MRSA PCR: negative 5/29 SARS2: negative  Thank you for allowing pharmacy to be a part of this patient's care.  Kara Mead 01/16/2019 9:03 AM

## 2019-01-16 NOTE — Progress Notes (Signed)
Text paged MD re. Patient hgb level of 6.9

## 2019-01-16 NOTE — Procedures (Signed)
PROCEDURE SUMMARY:  Successful US guided right thoracentesis. Yielded 600 mL of clear yellow fluid. Patient tolerated procedure well. No immediate complications. EBL = trace  Specimen was sent for labs.  Post procedure chest X-ray reveals no pneumothorax  WENDY S BLAIR PA-C 01/16/2019 2:55 PM

## 2019-01-16 NOTE — Progress Notes (Addendum)
PROGRESS NOTE    ARCH METHOT  FMB:[id number] DOB: 07/28/1941 DOA: 01/14/2019 PCP: Patient, No Pcp Per   Brief Narrative:  Scott Mcdowell  is a 78 year old gentleman with a history of CVA, hyperlipidemia and hypertension Was seen by his PCP on 01/14/2019 for scrotal swelling and was sent to the ER for further evaluation for possible cellulitis.  Patient was diagnosed with scrotal cellulitis, bilateral hydrocele, right pleural effusion and acute kidney injury And right lower lobe pneumonia/consolidation so he was transferred under hospitalist service at [facility] and was started on antibiotics.  Underwent diagnostic and therapeutic thoracentesis on 01/16/2019.  Dropped his hemoglobin to 6.9.  Will receive 1 unit today.  Consultants:   None  Procedures:   None  Antimicrobials:   Cefepime and vancomycin started on 01/14/2019   Subjective: Patient seen and examined earlier today.  He stated that he is feeling much better.  His scrotal swelling is improved according to him.  Denied any shortness of breath or chest pain or any other complaint.  Objective: Vitals:   01/16/19 1319 01/16/19 1350 01/16/19 1355 01/16/19 1400  BP: (!) 99/59 99/64 102/85 (!) 92/59  Pulse: 83     Resp: 20     Temp: 97.7 F (36.5 C)     TempSrc: Oral     SpO2: 97%     Weight:      Height:        Intake/Output Summary (Last 24 hours) at 01/16/2019 1503 Last data filed at 01/16/2019 1240 Gross per 24 hour  Intake 1522.8 ml  Output 400 ml  Net 1122.8 ml   Filed Weights   01/15/19 0022  Weight: 104.6 kg    Examination:   General exam: Appears calm and comfortable with some dysarthria at baseline Respiratory system: Clear to auscultation. Respiratory effort normal. Cardiovascular system: S1 & S2 heard, RRR. No JVD, murmurs, rubs, gallops or clicks. No pedal edema. Gastrointestinal system: Abdomen is nondistended, soft and nontender. No organomegaly or masses felt. Normal  bowel sounds heard.  Scrotal edema with mild erythema on the right side.  Signs of fungal/candidal infection in the groin. Central nervous system: Alert and oriented. No focal neurological deficits. Extremities: Symmetric 5 x 5 power. Skin: No rashes, lesions or ulcers Psychiatry: Judgement and insight appear poor. mood & affect appropriate.  Data Reviewed: I have personally reviewed following labs and imaging studies  CBC: Recent Labs  Lab 01/15/19 0134 01/16/19 0956  WBC 7.2 7.3  NEUTROABS  --  4.6  HGB 7.3* 6.9*  HCT 22.5* 22.2*  MCV 106.1* 106.7*  PLT 101* 701*   Basic Metabolic Panel: Recent Labs  Lab 01/15/19 0134 01/16/19 0956  NA 136 138  K 5.1 4.3  CL 106 107  CO2 21* 22  GLUCOSE 95 145*  BUN 78* 82*  CREATININE 2.44* 2.61*  CALCIUM 11.0* 11.1*  MG 2.8* 2.8*  PHOS 4.2  --    GFR: Estimated Creatinine Clearance: 29.6 mL/min (A) (by C-G formula based on SCr of 2.61 mg/dL (H)). Liver Function Tests: Recent Labs  Lab 01/15/19 0134 01/16/19 0956  AST 23 25  ALT 23 23  ALKPHOS 63 59  BILITOT 1.6* 1.2  PROT 6.4* 6.4*  ALBUMIN 4.1 3.9   No results for input(s): LIPASE, AMYLASE in the last 168 hours. No results for input(s): AMMONIA in the last 168 hours. Coagulation Profile: No results for input(s): INR, PROTIME in the last 168 hours. Cardiac Enzymes: Recent Labs  Lab 01/15/19 0134  01/15/19 1242 01/16/19 0956  TROPONINI 0.06* 0.05* 0.07*   BNP (last 3 results) No results for input(s): PROBNP in the last 8760 hours. HbA1C: No results for input(s): HGBA1C in the last 72 hours. CBG: No results for input(s): GLUCAP in the last 168 hours. Lipid Profile: No results for input(s): CHOL, HDL, LDLCALC, TRIG, CHOLHDL, LDLDIRECT in the last 72 hours. Thyroid Function Tests: Recent Labs    01/15/19 0134  TSH 5.402*   Anemia Panel: Recent Labs    01/15/19 0134  VITAMINB12 120*  FERRITIN 358*  TIBC 314  IRON 64   Sepsis Labs: No results for  input(s): PROCALCITON, LATICACIDVEN in the last 168 hours.  Recent Results (from the past 240 hour(s))  Culture, blood (routine x 2) Call MD if unable to obtain prior to antibiotics being given     Status: None (Preliminary result)   Collection Time: 01/15/19  1:34 AM  Result Value Ref Range Status   Specimen Description   Final    BLOOD LEFT HAND Performed at [facility] [address] [place], [place] [address]    Special Requests   Final    BOTTLES DRAWN AEROBIC AND ANAEROBIC Blood Culture adequate volume Performed at [facility] [address], [place], [place] [address]    Culture   Final    NO GROWTH 1 DAY Performed at [facility], [place] [address] [place], [place] [address]    Report Status PENDING  Incomplete  Culture, blood (routine x 2) Call MD if unable to obtain prior to antibiotics being given     Status: None (Preliminary result)   Collection Time: 01/15/19  1:34 AM  Result Value Ref Range Status   Specimen Description   Final    BLOOD LEFT ARM Performed at [facility] [address] [place], [place] [address]    Special Requests   Final    BOTTLES DRAWN AEROBIC AND ANAEROBIC Blood Culture adequate volume Performed at [facility] [address] [place], [place] [address]    Culture   Final    NO GROWTH 1 DAY Performed at [facility], [place] [address], [place], [place] [address]    Report Status PENDING  Incomplete  SARS Coronavirus 2 [facility] order, Performed in [facility] hospital lab)     Status: None   Collection Time: 01/15/19  2:55 PM  Result Value Ref Range Status   SARS Coronavirus 2 NEGATIVE NEGATIVE Final    Comment: (NOTE) If result is NEGATIVE SARS-CoV-2 target nucleic acids are NOT DETECTED. The SARS-CoV-2 RNA is generally detectable in upper and lower  respiratory specimens during the acute phase of infection. The lowest  concentration of  SARS-CoV-2 viral copies this assay can detect is 250  copies / mL. A negative result does not preclude SARS-CoV-2 infection  and should not be used as the sole basis for treatment or other  patient management decisions.  A negative result may occur with  improper specimen collection / handling, submission of specimen other  than nasopharyngeal swab, presence of viral mutation(s) within the  areas targeted by this assay, and inadequate number of viral copies  (<250 copies / mL). A negative result must be combined with clinical  observations, patient history, and epidemiological information. If result is POSITIVE SARS-CoV-2 target nucleic acids are DETECTED. The SARS-CoV-2 RNA is generally detectable in upper and lower  respiratory specimens dur ing the acute phase of infection.  Positive  results are indicative of active infection with SARS-CoV-2.  Clinical  correlation with patient history and other diagnostic information is  necessary to determine patient infection status.  Positive results do  not rule out bacterial infection or co-infection with other viruses. If result is PRESUMPTIVE POSTIVE SARS-CoV-2 nucleic acids MAY BE PRESENT.   A presumptive positive result was obtained on the submitted specimen  and confirmed on repeat testing.  While 2019 novel coronavirus  (SARS-CoV-2) nucleic acids may be present in the submitted sample  additional confirmatory testing may be necessary for epidemiological  and / or clinical management purposes  to differentiate between  SARS-CoV-2 and other Sarbecovirus currently known to infect humans.  If clinically indicated additional testing with an alternate test  methodology [phone]) is advised. The SARS-CoV-2 RNA is generally  detectable in upper and lower respiratory sp ecimens during the acute  phase of infection. The expected result is Negative. Fact Sheet for Patients:  StrictlyIdeas.no Fact Sheet for Healthcare  Providers: BankingDealers.co.za This test is not yet approved or cleared by the [place] FDA and has been authorized for detection and/or diagnosis of SARS-CoV-2 by FDA under an Emergency Use Authorization (EUA).  This EUA will remain in effect (meaning this test can be used) for the duration of the COVID-19 declaration under Section 564(b)(1) of the Act, 21 U.S.C. section 360bbb-3(b)(1), unless the authorization is terminated or revoked sooner. Performed at [facility], [place] [address], [place], [place] [address]   MRSA PCR Screening     Status: None   Collection Time: 01/15/19  6:15 PM  Result Value Ref Range Status   MRSA by PCR NEGATIVE NEGATIVE Final    Comment:        The GeneXpert MRSA Assay (FDA approved for NASAL specimens only), is one component of a comprehensive MRSA colonization surveillance program. It is not intended to diagnose MRSA infection nor to guide or monitor treatment for MRSA infections. Performed at [facility], [place] [address], [place], [place] [address]       Radiology Studies: Dg Chest 1 View  Result Date: 01/16/2019 CLINICAL DATA:  Post thoracentesis EXAM: CHEST  1 VIEW COMPARISON:  01/14/2019 FINDINGS: Normal heart size. Low volumes. Bibasilar atelectasis. There is no pneumothorax post right thoracentesis. IMPRESSION: No pneumothorax post right thoracentesis. Electronically Signed   By: Marybelle Killings M.D.   On: 01/16/2019 14:42   US Thoracentesis Asp Pleural Space W/img Guide  Result Date: 01/16/2019 INDICATION: Shortness of breath. Possible right lower lobe pneumonia. Right pleural effusion. Request for diagnostic and therapeutic thoracentesis. EXAM: ULTRASOUND GUIDED RIGHT THORACENTESIS MEDICATIONS: 1% lidocaine 10 mL COMPLICATIONS: None immediate. PROCEDURE: An ultrasound guided thoracentesis was thoroughly discussed with the patient and questions answered. The benefits, risks,  alternatives and complications were also discussed. The patient understands and wishes to proceed with the procedure. Written consent was obtained. Ultrasound was performed to localize and mark an adequate pocket of fluid in the right chest. The area was then prepped and draped in the normal sterile fashion. 1% Lidocaine was used for local anesthesia. Under ultrasound guidance a 6 Fr Safe-T-Centesis catheter was introduced. Thoracentesis was performed. The catheter was removed and a dressing applied. FINDINGS: A total of approximately 600 mL of clear yellow fluid was removed. Samples were sent to the laboratory as requested by the clinical team. IMPRESSION: Successful ultrasound guided right thoracentesis yielding 600 mL of pleural fluid. No pneumothorax on post-procedure chest x-ray. Read by: Gareth Eagle, PA-C Electronically  Signed   By: Jerilynn Mages.  Shick M.D.   On: 01/16/2019 14:54    Scheduled Meds: . sodium chloride   Intravenous Once  . lidocaine      . nystatin   Topical TID  . vitamin B-12  250 mcg Oral Daily   Continuous Infusions: . azithromycin 500 mg (01/16/19 0514)  . [START ON 01/17/2019] ceFEPime (MAXIPIME) IV    . vancomycin 2,000 mg (01/16/19 1255)     LOS: 2 days   Assessment & Plan:   Principal Problem:   ARF (acute renal failure) (HCC) Active Problems:   Hypercalcemia   Hyperkalemia   B12 deficiency anemia   Pleural effusion on right   Ascites   CAP (community acquired pneumonia)   Elevated troponin  Scrotal cellulitis/bilateral hydrocele: Slight improvement.  Continue cefepime but will discontinue vancomycin.  No signs of MRSA infection.  Community acquired pneumonia of right lower lobe/right pleural effusion: Diagnostic and therapeutic thoracentesis has been done today.  Labs are still pending.  COVID negative.  Continue cefepime.  Will draw blood culture, sputum culture, urine antigen for Legionella and streptococci as well as respiratory viral panel.  Inguinal/scrotal  fungal/Candida infection: None statin powder 3 times daily.  Acute kidney injury versus chronic kidney disease: Presented with creatinine of 2.5.  Currently 2.6.  Good urine output.  No prior labs available so wondering if this is his baseline chronic kidney disease versus acute kidney injury.  Due to pleural effusion, unable to provide him IV hydration.  Repeat labs in the morning.    Right-sided weakness/history of stroke: Continue aspirin and Lipitor.  Macrocytic anemia due to B12 deficiency: Hemoglobin dropped to 6.9.  Will transfuse 1 unit of PRBC.  Has B12 deficiency.  Continue B12 supplement and repeat labs in the morning.  Will check fecal occult blood test  Elevated troponin: Slight elevation, likely due to AKI.  No signs of ACS.  Hyperkalemia: Resolved.  Hypercalcemia: Stable.  DVT prophylaxis: SCD, avoiding heparin products due to drop in hemoglobin Code Status: Full code Family Communication: Discussed with patient Disposition Plan: To be determined pending clinical improvement   Time spent: 30 minutes   Darliss Cheney, MD [facility] Pager [phone]  If 7PM-7AM, please contact night-coverage [email or web address] Password [email or web address] 01/16/2019, 3:03 PM

## 2019-01-17 ENCOUNTER — Inpatient Hospital Stay (HOSPITAL_COMMUNITY): Payer: Medicare Other

## 2019-01-17 ENCOUNTER — Encounter (HOSPITAL_COMMUNITY): Payer: Self-pay | Admitting: Nephrology

## 2019-01-17 LAB — TYPE AND SCREEN
ABO/RH(D): O POS
Antibody Screen: NEGATIVE
Unit division: 0

## 2019-01-17 LAB — COMPREHENSIVE METABOLIC PANEL
ALT: 25 U/L (ref 0–44)
AST: 23 U/L (ref 15–41)
Albumin: 3.7 g/dL (ref 3.5–5.0)
Alkaline Phosphatase: 59 U/L (ref 38–126)
Anion gap: 9 (ref 5–15)
BUN: 77 mg/dL — ABNORMAL HIGH (ref 8–23)
CO2: 22 mmol/L (ref 22–32)
Calcium: 10.9 mg/dL — ABNORMAL HIGH (ref 8.9–10.3)
Chloride: 106 mmol/L (ref 98–111)
Creatinine, Ser: 2.4 mg/dL — ABNORMAL HIGH (ref 0.61–1.24)
GFR calc Af Amer: 29 mL/min — ABNORMAL LOW (ref >60)
GFR calc non Af Amer: 25 mL/min — ABNORMAL LOW (ref >60)
Glucose, Bld: 105 mg/dL — ABNORMAL HIGH (ref 70–99)
Potassium: 4.6 mmol/L (ref 3.5–5.1)
Sodium: 137 mmol/L (ref 135–145)
Total Bilirubin: 0.9 mg/dL (ref 0.3–1.2)
Total Protein: 6.2 g/dL — ABNORMAL LOW (ref 6.5–8.1)

## 2019-01-17 LAB — CBC WITH DIFFERENTIAL/PLATELET
Abs Immature Granulocytes: 0.72 10*3/uL — ABNORMAL HIGH (ref 0.00–0.07)
Basophils Absolute: 0 10*3/uL (ref 0.0–0.1)
Basophils Relative: 1 %
Eosinophils Absolute: 0.1 10*3/uL (ref 0.0–0.5)
Eosinophils Relative: 1 %
HCT: 23.6 % — ABNORMAL LOW (ref 39.0–52.0)
Hemoglobin: 7.4 g/dL — ABNORMAL LOW (ref 13.0–17.0)
Immature Granulocytes: 8 %
Lymphocytes Relative: 17 %
Lymphs Abs: 1.4 10*3/uL (ref 0.7–4.0)
MCH: 32.7 pg (ref 26.0–34.0)
MCHC: 31.4 g/dL (ref 30.0–36.0)
MCV: 104.4 fL — ABNORMAL HIGH (ref 80.0–100.0)
Monocytes Absolute: 1 10*3/uL (ref 0.1–1.0)
Monocytes Relative: 12 %
Neutro Abs: 5.4 10*3/uL (ref 1.7–7.7)
Neutrophils Relative %: 61 %
Platelets: 101 10*3/uL — ABNORMAL LOW (ref 150–400)
RBC: 2.26 MIL/uL — ABNORMAL LOW (ref 4.22–5.81)
RDW: 18.2 % — ABNORMAL HIGH (ref 11.5–15.5)
WBC: 8.7 10*3/uL (ref 4.0–10.5)
nRBC: 0.5 % — ABNORMAL HIGH (ref 0.0–0.2)

## 2019-01-17 LAB — PROTEIN / CREATININE RATIO, URINE
Creatinine, Urine: 103.03 mg/dL
Protein Creatinine Ratio: 1.12 mg/mg{Cre} — ABNORMAL HIGH (ref 0.00–0.15)
Total Protein, Urine: 115 mg/dL

## 2019-01-17 LAB — LACTATE DEHYDROGENASE: LDH: 263 U/L — ABNORMAL HIGH (ref 98–192)

## 2019-01-17 LAB — IRON AND TIBC
Iron: 64 ug/dL (ref 45–182)
Saturation Ratios: 21 % (ref 17.9–39.5)
TIBC: 305 ug/dL (ref 250–450)
UIBC: 241 ug/dL

## 2019-01-17 LAB — BPAM RBC
Blood Product Expiration Date: 202006202359
ISSUE DATE / TIME: 202005302251
Unit Type and Rh: 5100

## 2019-01-17 LAB — URINALYSIS, ROUTINE W REFLEX MICROSCOPIC
Bilirubin Urine: NEGATIVE
Glucose, UA: NEGATIVE mg/dL
Hgb urine dipstick: NEGATIVE
Ketones, ur: 5 mg/dL — AB
Leukocytes,Ua: NEGATIVE
Nitrite: NEGATIVE
Protein, ur: NEGATIVE mg/dL
Specific Gravity, Urine: 1.012 (ref 1.005–1.030)
pH: 5 (ref 5.0–8.0)

## 2019-01-17 LAB — STREP PNEUMONIAE URINARY ANTIGEN: Strep Pneumo Urinary Antigen: NEGATIVE

## 2019-01-17 MED ORDER — FUROSEMIDE 10 MG/ML IJ SOLN
60.0000 mg | Freq: Three times a day (TID) | INTRAMUSCULAR | Status: DC
  Administered 2019-01-17 (×2): 60 mg via INTRAVENOUS
  Filled 2019-01-17 (×5): qty 6

## 2019-01-17 MED ORDER — SODIUM CHLORIDE 0.9 % IV SOLN
2.0000 g | Freq: Two times a day (BID) | INTRAVENOUS | Status: DC
  Administered 2019-01-17: 2 g via INTRAVENOUS
  Filled 2019-01-17 (×2): qty 2

## 2019-01-17 NOTE — Consult Note (Addendum)
Renal Service Consult Note [facility]  Scott Mcdowell [facility] 01/17/2019 Sol [place] Requesting Physician:  Dr Doristine Bosworth  Reason for Consult:  Renal failure  HPI: The patient is a 78 y.o. year-old with hx of CVA and HTN presented w/ scrotal edema, possilbe cellulitis, pleural effusion and renal failure.  Pt admitted and had thoracentesis on 5/30 which was transudative.  Anemia Hb 6.9 prompted prbc's x 1.  Creat is 2.1- 2.4 here. Asked to see for renal failure.    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  No SOB, cough or orthopnea, + scrotal and LE edema.  Doesn't take any fluid pills, no voiding issues.  No change in urine.   ROS  denies CP  no joint pain   no HA  no blurry vision  no rash  no diarrhea  no nausea/ vomiting  no dysuria  no difficulty voiding  no change in urine color    Past Medical History  Past Medical History:  Diagnosis Date  . Stroke [facility])    w right sided weakness   Past Surgical History History reviewed. No pertinent surgical history. Family History  Family History  Problem Relation Age of Onset  . Cancer Mother   . Parkinson's disease Father    Social History  reports that he has quit smoking. His smoking use included cigarettes. He has never used smokeless tobacco. He reports current alcohol use of about 5.0 standard drinks of alcohol per week. No history on file for drug. Allergies No Known Allergies Home medications Prior to Admission medications   Medication Sig Start Date End Date Taking? Authorizing Provider  aspirin 81 MG chewable tablet Chew 81 mg by mouth daily.   Yes [provider]  atorvastatin (LIPITOR) 10 MG tablet Take 10 mg by mouth daily. 11/12/18  Yes [provider]  ramipril (ALTACE) 2.5 MG capsule Take 2.5 mg by mouth daily. 11/12/18  Yes [provider]   Liver Function Tests Recent Labs  Lab 01/15/19 0134 01/16/19 0956  01/17/19 0434  AST '23 25 23  ' ALT '23 23 25  ' ALKPHOS 63 59 59  BILITOT 1.6* 1.2 0.9  PROT 6.4* 6.4* 6.2*  ALBUMIN 4.1 3.9 3.7   No results for input(s): LIPASE, AMYLASE in the last 168 hours. CBC Recent Labs  Lab 01/15/19 0134 01/16/19 0956 01/17/19 0434  WBC 7.2 7.3 8.7  NEUTROABS  --  4.6 5.4  HGB 7.3* 6.9* 7.4*  HCT 22.5* 22.2* 23.6*  MCV 106.1* 106.7* 104.4*  PLT 101* 105* 481*   Basic Metabolic Panel Recent Labs  Lab 01/15/19 0134 01/16/19 0956 01/17/19 0434  NA 136 138 137  K 5.1 4.3 4.6  CL 106 107 106  CO2 21* 22 22  GLUCOSE 95 145* 105*  BUN 78* 82* 77*  CREATININE 2.44* 2.61* 2.40*  CALCIUM 11.0* 11.1* 10.9*  PHOS 4.2  --   --    Iron/TIBC/Ferritin/ %Sat    Component Value Date/Time   IRON 64 01/15/2019 0134   TIBC 314 01/15/2019 0134   FERRITIN 358 (H) 01/15/2019 0134   IRONPCTSAT 20 01/15/2019 0134    Vitals:   01/16/19 2318 01/17/19 0235 01/17/19 0456 01/17/19 1415  BP: (!) 104/56 1'00/61 92/64 98/64 '  Pulse: 87 80 81 80  Resp: '20 16 20   ' Temp: 98.2 F (36.8 C) 98 F (36.7 C) 98.3 F (36.8 C) 97.7 F (36.5 C)  TempSrc:  Oral  Oral Oral  SpO2: 100% 99% 94% 95%  Weight:      Height:        Exam Gen alert, pleasant, no distress, up in chair No rash, cyanosis or gangrene Sclera anicteric, throat clear  No jvd or bruits Chest bronch BS R base, rales L base RRR no MRG Abd soft ntnd no mass or ascites +bs GU normal male w/ mod scrotal edema, no erythema MS no joint effusions or deformity Ext 1-2+ bilat LE and pedal edema Neuro is alert, Ox 3 , nf    Home meds:  - aspirin 81/ atrovastatin 10 qd  - ramipril 2.5 qd   UNa 29, UCr 129  CXR > FINDINGS: Normal heart size. Low volumes. Bibasilar atelectasis. There is no pneumothorax post right thoracentesis.  Assessment: 1. Renal failure - no old records for baseline creatinine. Suspect CKD but can't be sure. Creat 2.4, eGFR 25 today.  No voiding issues. Needs work-up w/ renal US, UA,  urine PC ratio.  Albumin wnl which argues against neph syndrome.  Has high Ca++ and sig anemia , myeloma w/u already started. Platelets low, check [place] for cirrhosis.   2. Vol overload - sig LE / scrotal edema, rales and R effusion.  May have diast HF component but difficult to differentiate w/ CKD as well. Recommend IV lasix, cauitous due to low BP's.   3. Hypotension - ACEi on hold.    4. Anemia - macrocytic, check B12/folate  5. H/o CVA - w/ R hemiparesis, lives at home w/ wife 6. HTN - bp's low, holding meds    Plan: 1. Will follow.       [name]  MD 01/17/2019, 2:44 PM

## 2019-01-17 NOTE — Progress Notes (Signed)
PHARMACY NOTE:  ANTIMICROBIAL RENAL DOSAGE ADJUSTMENT  Current antimicrobial regimen includes a mismatch between antimicrobial dosage and estimated renal function.  As per policy approved by the Pharmacy & Therapeutics and Medical Executive Committees, the antimicrobial dosage will be adjusted accordingly.  Current antimicrobial dosage:  Cefepime 2g q24  Indication: PNA, cellulitis  Renal Function:  Estimated Creatinine Clearance: 32.2 mL/min (A) (by C-G formula based on SCr of 2.4 mg/dL (H)). []      On intermittent HD, scheduled: []      On CRRT    Antimicrobial dosage has been changed to:  Cefepime 2g IV q12  Additional comments:   Thank you for allowing pharmacy to be a part of this patient's care.  Kara Mead, [facility] 01/17/2019 12:09 PM

## 2019-01-17 NOTE — Progress Notes (Signed)
PROGRESS NOTE    Scott Mcdowell  TKZ:[id number] DOB: [birth year] DOA: 01/14/2019 PCP: Patient, No Pcp Per   Brief Narrative:  Scott Mcdowell  is a 78 year old gentleman with a history of CVA, hyperlipidemia and hypertension Was seen by his PCP on 01/14/2019 for scrotal swelling and was sent to the ER for further evaluation for possible cellulitis.  Patient was diagnosed with scrotal cellulitis, bilateral hydrocele, right pleural effusion and acute kidney injury And right lower lobe pneumonia/consolidation so he was transferred under hospitalist service at [facility] and was started on antibiotics.  Underwent diagnostic and therapeutic thoracentesis on 01/16/2019.  Dropped his hemoglobin to 6.9 on 01/16/2019 and he received 1 unit of transfusion.  Pleural fluid analysis indicate transudative pleural effusion.  Renal function has stabilized at around 2.4 creatinine.  Consultants:   None  Procedures:   None  Antimicrobials:   Cefepime and vancomycin started on 01/14/2019   Subjective: Patient seen and examined.  For the first time today he complained of some shortness of breath although he was comfortable on room air.  He is also concerned about scrotal swelling however his erythema has improved.  No new complaint.  Objective: Vitals:   01/16/19 2240 01/16/19 2318 01/17/19 0235 01/17/19 0456  BP: 101/64 (!) 104/56 100/61 92/64  Pulse: 88 87 80 81  Resp: '16 20 16 20  ' Temp: 97.9 F (36.6 C) 98.2 F (36.8 C) 98 F (36.7 C) 98.3 F (36.8 C)  TempSrc: Oral Oral  Oral  SpO2: 97% 100% 99% 94%  Weight:      Height:        Intake/Output Summary (Last 24 hours) at 01/17/2019 1040 Last data filed at 01/17/2019 0235 Gross per 24 hour  Intake 936.15 ml  Output 300 ml  Net 636.15 ml   Filed Weights   01/15/19 0022  Weight: 104.6 kg    Examination:  General exam: Appears calm and comfortable  Respiratory system: Rhonchi at the bases bilaterally, more on the left  than the right.  Respiratory effort normal.  No wheezes. Cardiovascular system: S1 & S2 heard, RRR. No JVD, murmurs, rubs, gallops or clicks. No pedal edema. Gastrointestinal system: Abdomen is nondistended, soft and nontender. No organomegaly or masses felt. Normal bowel sounds heard.  Scrotal edema.  No more erythema or tenderness. Central nervous system: Alert and oriented. No focal neurological deficits. Extremities: Symmetric 5 x 5 power. Skin: No rashes, lesions or ulcers Psychiatry: Judgement and insight appear normal. Mood & affect appropriate.  Data Reviewed: I have personally reviewed following labs and imaging studies  CBC: Recent Labs  Lab 01/15/19 0134 01/16/19 0956 01/17/19 0434  WBC 7.2 7.3 8.7  NEUTROABS  --  4.6 5.4  HGB 7.3* 6.9* 7.4*  HCT 22.5* 22.2* 23.6*  MCV 106.1* 106.7* 104.4*  PLT 101* 105* 573*   Basic Metabolic Panel: Recent Labs  Lab 01/15/19 0134 01/16/19 0956 01/17/19 0434  NA 136 138 137  K 5.1 4.3 4.6  CL 106 107 106  CO2 21* 22 22  GLUCOSE 95 145* 105*  BUN 78* 82* 77*  CREATININE 2.44* 2.61* 2.40*  CALCIUM 11.0* 11.1* 10.9*  MG 2.8* 2.8*  --   PHOS 4.2  --   --    GFR: Estimated Creatinine Clearance: 32.2 mL/min (A) (by C-G formula based on SCr of 2.4 mg/dL (H)). Liver Function Tests: Recent Labs  Lab 01/15/19 0134 01/16/19 0956 01/17/19 0434  AST '23 25 23  ' ALT 23 23 25  ALKPHOS 63 59 59  BILITOT 1.6* 1.2 0.9  PROT 6.4* 6.4* 6.2*  ALBUMIN 4.1 3.9 3.7   No results for input(s): LIPASE, AMYLASE in the last 168 hours. No results for input(s): AMMONIA in the last 168 hours. Coagulation Profile: No results for input(s): INR, PROTIME in the last 168 hours. Cardiac Enzymes: Recent Labs  Lab 01/15/19 0134 01/15/19 1242 01/16/19 0956 01/16/19 1538  TROPONINI 0.06* 0.05* 0.07* 0.06*   BNP (last 3 results) No results for input(s): PROBNP in the last 8760 hours. HbA1C: No results for input(s): HGBA1C in the last 72  hours. CBG: No results for input(s): GLUCAP in the last 168 hours. Lipid Profile: No results for input(s): CHOL, HDL, LDLCALC, TRIG, CHOLHDL, LDLDIRECT in the last 72 hours. Thyroid Function Tests: Recent Labs    01/15/19 0134  TSH 5.402*   Anemia Panel: Recent Labs    01/15/19 0134  VITAMINB12 120*  FERRITIN 358*  TIBC 314  IRON 64   Sepsis Labs: No results for input(s): PROCALCITON, LATICACIDVEN in the last 168 hours.  Recent Results (from the past 240 hour(s))  Culture, blood (routine x 2) Call MD if unable to obtain prior to antibiotics being given     Status: None (Preliminary result)   Collection Time: 01/15/19  1:34 AM  Result Value Ref Range Status   Specimen Description   Final    BLOOD LEFT HAND Performed at [facility] [address], [place], [place] [address]    Special Requests   Final    BOTTLES DRAWN AEROBIC AND ANAEROBIC Blood Culture adequate volume Performed at [facility] [address], [place], [place] [address]    Culture   Final    NO GROWTH 1 DAY Performed at [facility], [place] [address] [place], [place] [address]    Report Status PENDING  Incomplete  Culture, blood (routine x 2) Call MD if unable to obtain prior to antibiotics being given     Status: None (Preliminary result)   Collection Time: 01/15/19  1:34 AM  Result Value Ref Range Status   Specimen Description   Final    BLOOD LEFT ARM Performed at [facility] [address] [place], [place] [address]    Special Requests   Final    BOTTLES DRAWN AEROBIC AND ANAEROBIC Blood Culture adequate volume Performed at [facility] [address] [place], [place] [address]    Culture   Final    NO GROWTH 1 DAY Performed at [facility], [place] [address] [place], [place] [address]    Report Status PENDING  Incomplete  SARS Coronavirus 2 [facility] order, Performed in [facility] hospital lab)      Status: None   Collection Time: 01/15/19  2:55 PM  Result Value Ref Range Status   SARS Coronavirus 2 NEGATIVE NEGATIVE Final    Comment: (NOTE) If result is NEGATIVE SARS-CoV-2 target nucleic acids are NOT DETECTED. The SARS-CoV-2 RNA is generally detectable in upper and lower  respiratory specimens during the acute phase of infection. The lowest  concentration of SARS-CoV-2 viral copies this assay can detect is 250  copies / mL. A negative result does not preclude SARS-CoV-2 infection  and should not be used as the sole basis for treatment or other  patient management decisions.  A negative result may occur with  improper specimen collection / handling, submission of specimen other  than nasopharyngeal swab, presence of viral  mutation(s) within the  areas targeted by this assay, and inadequate number of viral copies  (<250 copies / mL). A negative result must be combined with clinical  observations, patient history, and epidemiological information. If result is POSITIVE SARS-CoV-2 target nucleic acids are DETECTED. The SARS-CoV-2 RNA is generally detectable in upper and lower  respiratory specimens dur ing the acute phase of infection.  Positive  results are indicative of active infection with SARS-CoV-2.  Clinical  correlation with patient history and other diagnostic information is  necessary to determine patient infection status.  Positive results do  not rule out bacterial infection or co-infection with other viruses. If result is PRESUMPTIVE POSTIVE SARS-CoV-2 nucleic acids MAY BE PRESENT.   A presumptive positive result was obtained on the submitted specimen  and confirmed on repeat testing.  While 2019 novel coronavirus  (SARS-CoV-2) nucleic acids may be present in the submitted sample  additional confirmatory testing may be necessary for epidemiological  and / or clinical management purposes  to differentiate between  SARS-CoV-2 and other Sarbecovirus currently known to  infect humans.  If clinically indicated additional testing with an alternate test  methodology [phone]) is advised. The SARS-CoV-2 RNA is generally  detectable in upper and lower respiratory sp ecimens during the acute  phase of infection. The expected result is Negative. Fact Sheet for Patients:  StrictlyIdeas.no Fact Sheet for Healthcare Providers: BankingDealers.co.za This test is not yet approved or cleared by the [place] FDA and has been authorized for detection and/or diagnosis of SARS-CoV-2 by FDA under an Emergency Use Authorization (EUA).  This EUA will remain in effect (meaning this test can be used) for the duration of the COVID-19 declaration under Section 564(b)(1) of the Act, 21 U.S.C. section 360bbb-3(b)(1), unless the authorization is terminated or revoked sooner. Performed at [facility], [place] [address] [place], [place] [address]   MRSA PCR Screening     Status: None   Collection Time: 01/15/19  6:15 PM  Result Value Ref Range Status   MRSA by PCR NEGATIVE NEGATIVE Final    Comment:        The GeneXpert MRSA Assay (FDA approved for NASAL specimens only), is one component of a comprehensive MRSA colonization surveillance program. It is not intended to diagnose MRSA infection nor to guide or monitor treatment for MRSA infections. Performed at [facility], [place] [address] [place], [place] [address]   Gram stain     Status: None (Preliminary result)   Collection Time: 01/16/19  2:24 PM  Result Value Ref Range Status   Specimen Description PLEURAL RIGHT  Final   Special Requests NONE  Final   Gram Stain   Final    WBC PRESENT,BOTH PMN AND MONONUCLEAR NO ORGANISMS SEEN CYTOSPIN SMEAR Performed at [facility], [address] [place], [place] [address]    Report Status PENDING  Incomplete  Respiratory Panel by PCR     Status: None   Collection Time: 01/16/19   3:08 PM  Result Value Ref Range Status   Adenovirus NOT DETECTED NOT DETECTED Final   Coronavirus 229E NOT DETECTED NOT DETECTED Final    Comment: (NOTE) The Coronavirus on the Respiratory Panel, DOES NOT test for the novel  Coronavirus (2019 nCoV)    Coronavirus HKU1 NOT DETECTED NOT DETECTED Final   Coronavirus NL63 NOT DETECTED NOT DETECTED Final   Coronavirus OC43 NOT DETECTED NOT DETECTED Final   Metapneumovirus NOT DETECTED NOT DETECTED Final   Rhinovirus /  Enterovirus NOT DETECTED NOT DETECTED Final   Influenza A NOT DETECTED NOT DETECTED Final   Influenza B NOT DETECTED NOT DETECTED Final   Parainfluenza Virus 1 NOT DETECTED NOT DETECTED Final   Parainfluenza Virus 2 NOT DETECTED NOT DETECTED Final   Parainfluenza Virus 3 NOT DETECTED NOT DETECTED Final   Parainfluenza Virus 4 NOT DETECTED NOT DETECTED Final   Respiratory Syncytial Virus NOT DETECTED NOT DETECTED Final   Bordetella pertussis NOT DETECTED NOT DETECTED Final   Chlamydophila pneumoniae NOT DETECTED NOT DETECTED Final   Mycoplasma pneumoniae NOT DETECTED NOT DETECTED Final    Comment: Performed at [facility], [place] [address], [place], [place] [address]  Culture, blood (routine x 2)     Status: None (Preliminary result)   Collection Time: 01/16/19  3:38 PM  Result Value Ref Range Status   Specimen Description   Final    BLOOD LEFT ANTECUBITAL Performed at [facility], [place] [address] [place], [place] [address]    Special Requests   Final    BOTTLES DRAWN AEROBIC ONLY Blood Culture adequate volume Performed at [facility] [address], [place], [place] [address]    Culture PENDING  Incomplete   Report Status PENDING  Incomplete  Culture, blood (routine x 2)     Status: None (Preliminary result)   Collection Time: 01/16/19  3:38 PM  Result Value Ref Range Status   Specimen Description   Final    BLOOD RIGHT ANTECUBITAL Performed at [facility], [place] [address] [place], [place] [address]    Special Requests   Final    BOTTLES DRAWN AEROBIC ONLY Blood Culture adequate volume Performed at [facility] [address], [place], [place] [address]    Culture PENDING  Incomplete   Report Status PENDING  Incomplete      Radiology Studies: Dg Chest 1 View  Result Date: 01/16/2019 CLINICAL DATA:  Post thoracentesis EXAM: CHEST  1 VIEW COMPARISON:  01/14/2019 FINDINGS: Normal heart size. Low volumes. Bibasilar atelectasis. There is no pneumothorax post right thoracentesis. IMPRESSION: No pneumothorax post right thoracentesis. Electronically Signed   By: Marybelle Killings M.D.   On: 01/16/2019 14:42   US Thoracentesis Asp Pleural Space W/img Guide  Result Date: 01/16/2019 INDICATION: Shortness of breath. Possible right lower lobe pneumonia. Right pleural effusion. Request for diagnostic and therapeutic thoracentesis. EXAM: ULTRASOUND GUIDED RIGHT THORACENTESIS MEDICATIONS: 1% lidocaine 10 mL COMPLICATIONS: None immediate. PROCEDURE: An ultrasound guided thoracentesis was thoroughly discussed with the patient and questions answered. The benefits, risks, alternatives and complications were also discussed. The patient understands and wishes to proceed with the procedure. Written consent was obtained. Ultrasound was performed to localize and mark an adequate pocket of fluid in the right chest. The area was then prepped and draped in the normal sterile fashion. 1% Lidocaine was used for local anesthesia. Under ultrasound guidance a 6 Fr Safe-T-Centesis catheter was introduced. Thoracentesis was performed. The catheter was removed and a dressing applied. FINDINGS: A total of approximately 600 mL of clear yellow fluid was removed. Samples were sent to the laboratory as requested by the clinical team. IMPRESSION: Successful ultrasound guided right thoracentesis yielding 600 mL of pleural fluid. No pneumothorax on post-procedure chest x-ray. Read by: Gareth Eagle, PA-C Electronically Signed   By: Jerilynn Mages.  Shick M.D.   On: 01/16/2019 14:54    Scheduled Meds:  sodium chloride   Intravenous Once   aspirin  81 mg  Oral Daily   atorvastatin  10 mg Oral Daily   nystatin   Topical TID   vitamin B-12  250 mcg Oral Daily   Continuous Infusions:  ceFEPime (MAXIPIME) IV     vancomycin Stopped (01/16/19 1749)     LOS: 3 days   Assessment & Plan:   Principal Problem:   ARF (acute renal failure) (HCC) Active Problems:   Hypercalcemia   Hyperkalemia   B12 deficiency anemia   Pleural effusion on right   Ascites   CAP (community acquired pneumonia)   Elevated troponin  Scrotal cellulitis/bilateral hydrocele: Very minimal improvement in edema but significant improvement in cellulitis of the scrotum.  No signs of MRSA so I will discontinue vancomycin to prevent further nephrotoxicity but will continue cefepime for now.  Community acquired pneumonia of right lower lobe/right pleural effusion: Diagnostic and therapeutic thoracentesis done on 01/16/2019.  Fluid analysis indicate transudate of pleural effusion.  Has normal echo with no systolic or diastolic dysfunction and in the face of elevated but stable creatinine, I suspect nephrotic syndrome. COVID negative.  I have ordered basic labs for diagnosis of nephrotic syndrome and I have consulted nephrology for the possible need of renal biopsy.  Inguinal/scrotal fungal/Candida infection: Nystatin powder 3 times daily.  Acute kidney injury versus chronic kidney disease: Presented with creatinine of 2.5.  And has remained stable at that range.  Good urine output.  With transudative pleural effusion, high suspicion of nephrotic syndrome.  Nephrology consulted.  Right-sided weakness/history of stroke: Continue aspirin and Lipitor.  Macrocytic anemia due to B12 deficiency: Hemoglobin dropped to 6.9 on 01/16/2019 and status post 1 unit of transfusion.Marland Kitchen  Has B12 deficiency.  Continue B12 supplement and repeat  labs in the morning.  fecal occult blood test ordered but is still pending.  Elevated troponin: Slight elevation, likely due to AKI.  No signs of ACS.  Hyperkalemia: Resolved.  Hypercalcemia: Stable.  In the face of anemia, hypercalcemia and elevated creatinine, multiple myeloma is also 1 of the differential diagnosis.  Will order urine and serum protein electrophoresis as well as serum free light chain assay.  DVT prophylaxis: SCD, avoiding heparin products due to drop in hemoglobin Code Status: Full code Family Communication: Discussed with patient Disposition Plan: To be determined pending clinical improvement   Time spent: 37 minutes   Darliss Cheney, MD [facility] Pager [phone]  If 7PM-7AM, please contact night-coverage [email or web address] Password [email or web address] 01/17/2019, 10:40 AM

## 2019-01-18 LAB — CBC WITH DIFFERENTIAL/PLATELET
Abs Immature Granulocytes: 0.99 10*3/uL — ABNORMAL HIGH (ref 0.00–0.07)
Basophils Absolute: 0.1 10*3/uL (ref 0.0–0.1)
Basophils Relative: 1 %
Eosinophils Absolute: 0.1 10*3/uL (ref 0.0–0.5)
Eosinophils Relative: 1 %
HCT: 23 % — ABNORMAL LOW (ref 39.0–52.0)
Hemoglobin: 7.4 g/dL — ABNORMAL LOW (ref 13.0–17.0)
Immature Granulocytes: 10 %
Lymphocytes Relative: 15 %
Lymphs Abs: 1.5 10*3/uL (ref 0.7–4.0)
MCH: 33.6 pg (ref 26.0–34.0)
MCHC: 32.2 g/dL (ref 30.0–36.0)
MCV: 104.5 fL — ABNORMAL HIGH (ref 80.0–100.0)
Monocytes Absolute: 1.1 10*3/uL — ABNORMAL HIGH (ref 0.1–1.0)
Monocytes Relative: 11 %
Neutro Abs: 5.9 10*3/uL (ref 1.7–7.7)
Neutrophils Relative %: 62 %
Platelets: 99 10*3/uL — ABNORMAL LOW (ref 150–400)
RBC: 2.2 MIL/uL — ABNORMAL LOW (ref 4.22–5.81)
RDW: 18.4 % — ABNORMAL HIGH (ref 11.5–15.5)
WBC: 9.6 10*3/uL (ref 4.0–10.5)
nRBC: 0.3 % — ABNORMAL HIGH (ref 0.0–0.2)

## 2019-01-18 LAB — VITAMIN B12: Vitamin B-12: 193 pg/mL (ref 180–914)

## 2019-01-18 LAB — COMPREHENSIVE METABOLIC PANEL
ALT: 25 U/L (ref 0–44)
AST: 26 U/L (ref 15–41)
Albumin: 3.7 g/dL (ref 3.5–5.0)
Alkaline Phosphatase: 60 U/L (ref 38–126)
Anion gap: 10 (ref 5–15)
BUN: 90 mg/dL — ABNORMAL HIGH (ref 8–23)
CO2: 20 mmol/L — ABNORMAL LOW (ref 22–32)
Calcium: 10.5 mg/dL — ABNORMAL HIGH (ref 8.9–10.3)
Chloride: 103 mmol/L (ref 98–111)
Creatinine, Ser: 3.12 mg/dL — ABNORMAL HIGH (ref 0.61–1.24)
GFR calc Af Amer: 21 mL/min — ABNORMAL LOW (ref >60)
GFR calc non Af Amer: 18 mL/min — ABNORMAL LOW (ref >60)
Glucose, Bld: 106 mg/dL — ABNORMAL HIGH (ref 70–99)
Potassium: 5.2 mmol/L — ABNORMAL HIGH (ref 3.5–5.1)
Sodium: 133 mmol/L — ABNORMAL LOW (ref 135–145)
Total Bilirubin: 1 mg/dL (ref 0.3–1.2)
Total Protein: 6.1 g/dL — ABNORMAL LOW (ref 6.5–8.1)

## 2019-01-18 LAB — PROTEIN ELECTROPHORESIS, SERUM
A/G Ratio: 1.6 (ref 0.7–1.7)
Albumin ELP: 3.7 g/dL (ref 2.9–4.4)
Alpha-1-Globulin: 0.3 g/dL (ref 0.0–0.4)
Alpha-2-Globulin: 0.6 g/dL (ref 0.4–1.0)
Beta Globulin: 1 g/dL (ref 0.7–1.3)
Gamma Globulin: 0.4 g/dL (ref 0.4–1.8)
Globulin, Total: 2.3 g/dL (ref 2.2–3.9)
Total Protein ELP: 6 g/dL (ref 6.0–8.5)

## 2019-01-18 LAB — LEGIONELLA PNEUMOPHILA SEROGP 1 UR AG: L. pneumophila Serogp 1 Ur Ag: NEGATIVE

## 2019-01-18 LAB — PH, BODY FLUID: pH, Body Fluid: 7.5

## 2019-01-18 LAB — GRAM STAIN

## 2019-01-18 LAB — HEPATITIS PANEL, ACUTE
HCV Ab: <0.1 s/co ratio (ref 0.0–0.9)
Hep A IgM: NEGATIVE
Hep B C IgM: NEGATIVE
Hepatitis B Surface Ag: NEGATIVE

## 2019-01-18 LAB — KAPPA/LAMBDA LIGHT CHAINS
Kappa free light chain: 27.9 mg/L — ABNORMAL HIGH (ref 3.3–19.4)
Kappa, lambda light chain ratio: 0.21 — ABNORMAL LOW (ref 0.26–1.65)
Lambda free light chains: 129.9 mg/L — ABNORMAL HIGH (ref 5.7–26.3)

## 2019-01-18 LAB — ANA: Anti Nuclear Antibody (ANA): NEGATIVE

## 2019-01-18 LAB — CALCITRIOL (1,25 DI-OH VIT D): Vit D, 1,25-Dihydroxy: <5 pg/mL — ABNORMAL LOW (ref 19.9–79.3)

## 2019-01-18 LAB — FOLATE RBC
Folate, Hemolysate: 222 ng/mL
Folate, RBC: 961 ng/mL (ref >498)
Hematocrit: 23.1 % — ABNORMAL LOW (ref 37.5–51.0)

## 2019-01-18 LAB — C3 COMPLEMENT: C3 Complement: 117 mg/dL (ref 82–167)

## 2019-01-18 LAB — C4 COMPLEMENT: Complement C4, Body Fluid: 26 mg/dL (ref 14–44)

## 2019-01-18 MED ORDER — SODIUM CHLORIDE 0.9 % IV SOLN
510.0000 mg | Freq: Once | INTRAVENOUS | Status: AC
  Administered 2019-01-18: 510 mg via INTRAVENOUS
  Filled 2019-01-18: qty 17

## 2019-01-18 MED ORDER — SODIUM CHLORIDE 0.9 % IV SOLN
1.0000 g | Freq: Two times a day (BID) | INTRAVENOUS | Status: DC
  Administered 2019-01-18 – 2019-01-19 (×3): 1 g via INTRAVENOUS
  Filled 2019-01-18 (×4): qty 1

## 2019-01-18 NOTE — Progress Notes (Signed)
Rehab Admissions Coordinator Note:  Per PT recommendation, this patient was screened by Jhonnie Garner for appropriateness for an Inpatient Acute Rehab Consult.  At this time, we are recommending Inpatient Rehab consult. AC will contact MD to request order.   Jhonnie Garner 01/18/2019, 12:20 PM  I can be reached at [phone].

## 2019-01-18 NOTE — Care Management Important Message (Signed)
Important Message  Patient Details IM Letter given to Dessa Phi RN to present to the Patient Name: Scott Mcdowell MRN: [id number] Date of Birth: 06-09-1941   Medicare Important Message Given:  Yes    Kerin Salen 01/18/2019, 12:06 PM

## 2019-01-18 NOTE — Evaluation (Addendum)
Physical Therapy Evaluation Patient Details Name: Scott Mcdowell MRN: [id number] DOB: 10/09/1940 Today's Date: 01/18/2019   History of Present Illness  78 yo male admitted with ARF, scrotal cellulitis, Pna. Hx of CVA with R hemiparesis  Clinical Impression  On eval, pt required Mod assist +2 for mobility. He was able to stand and take a few side steps along the side of the bed using a RW. R knee buckling required blocking by therapist to prevent fall. Unable to safely take any steps away from the bed with +1 assist on today 2* high fall risk. Pt presents with general weakness, worsened R side (UE/LE) weakness, and impaired gait and balance. Pt is significantly weaker compared to his reported baseline/PLOF. At this time, recommend CIR consult-feel pt would be a good candidate. If CIR is not an option, then pt will need SNF rehab. Will continue to follow and progress activity as tolerated.     Follow Up Recommendations CIR    Equipment Recommendations  None recommended by PT    Recommendations for Other Services Rehab consult;OT consult     Precautions / Restrictions Precautions Precautions: Fall Precaution Comments: R hemiparesis Restrictions Weight Bearing Restrictions: No      Mobility  Bed Mobility Overal bed mobility: Needs Assistance Bed Mobility: Rolling;Supine to Sit;Sit to Supine Rolling: Mod assist   Supine to sit: Min assist;HOB elevated Sit to supine: Mod assist   General bed mobility comments: Min assist to roll to R side; Mod assist to roll to L side. Assist for trunk and bil LEs for supine<>sit. Assist to scoot to EOB. Increased time. Cues for technique. Pt relied heavily on bedrail.   Transfers Overall transfer level: Needs assistance Equipment used: Rolling walker (2 wheeled) Transfers: Sit to/from Stand Sit to Stand: Mod assist;+2 safety/equipment;From elevated surface         General transfer comment: Assist to rise, stabilize, block R knee,  control descent. VCs safety, technique, hand placement.   Ambulation/Gait Ambulation/Gait assistance: Mod assist;+2 safety/equipment; +2 physical assistance           General Gait Details: Side steps along the side of the bed with a RW. Assist to manage RW and block R knee 2* buckling. Unable to safely step away from the bed without +2 assist 2* high risk for falls.   Stairs            Wheelchair Mobility    Modified Rankin (Stroke Patients Only)       Balance Overall balance assessment: Needs assistance         Standing balance support: Bilateral upper extremity supported Standing balance-Leahy Scale: Poor                               Pertinent Vitals/Pain Pain Assessment: No/denies pain    Home Living Family/patient expects to be discharged to:: Private residence Living Arrangements: Spouse/significant other Available Help at Discharge: Family Type of Home: House Home Access: Stairs to enter Entrance Stairs-Rails: None Entrance Stairs-Number of Steps: 3 (uses door/frame) to hold on to Home Layout: Two level;Able to live on main level with bedroom/bathroom Home Equipment: Gilford Rile - 2 wheels;Cane - single point      Prior Function Level of Independence: Independent with assistive device(s)         Comments: uses RW vs cane PRN. "On a good day" pt can ambulate short distances without a device and with Mod Independence.  Hand Dominance        Extremity/Trunk Assessment   Upper Extremity Assessment Upper Extremity Assessment: Defer to OT evaluation    Lower Extremity Assessment Lower Extremity Assessment: RLE deficits/detail RLE Deficits / Details: hip flex 2/5, knee ext 3-/5 RLE Sensation: decreased proprioception RLE Coordination: decreased gross motor;decreased fine motor    Cervical / Trunk Assessment Cervical / Trunk Assessment: Normal  Communication   Communication: No difficulties  Cognition Arousal/Alertness:  Awake/alert Behavior During Therapy: WFL for tasks assessed/performed Overall Cognitive Status: Within Functional Limits for tasks assessed                                        General Comments      Exercises     Assessment/Plan    PT Assessment Patient needs continued PT services  PT Problem List Decreased strength;Decreased balance;Decreased range of motion;Decreased mobility;Decreased knowledge of use of DME;Decreased activity tolerance;Decreased coordination       PT Treatment Interventions DME instruction;Gait training;Therapeutic activities;Functional mobility training;Balance training;Patient/family education;Therapeutic exercise    PT Goals (Current goals can be found in the Care Plan section)  Acute Rehab PT Goals Patient Stated Goal: to regain PLOF PT Goal Formulation: With patient Time For Goal Achievement: 02/01/19 Potential to Achieve Goals: Good    Frequency Min 3X/week   Barriers to discharge        Co-evaluation               AM-PAC PT "6 Clicks" Mobility  Outcome Measure Help needed turning from your back to your side while in a flat bed without using bedrails?: A Lot Help needed moving from lying on your back to sitting on the side of a flat bed without using bedrails?: A Lot Help needed moving to and from a bed to a chair (including a wheelchair)?: A Lot Help needed standing up from a chair using your arms (e.g., wheelchair or bedside chair)?: A Lot Help needed to walk in hospital room?: A Lot Help needed climbing 3-5 steps with a railing? : Total 6 Click Score: 11    End of Session Equipment Utilized During Treatment: Gait belt Activity Tolerance: Patient tolerated treatment well Patient left: in bed;with call bell/phone within reach;with bed alarm set   PT Visit Diagnosis: Muscle weakness (generalized) (M62.81);Difficulty in walking, not elsewhere classified (R26.2);Hemiplegia and hemiparesis Hemiplegia - Right/Left:  Right Hemiplegia - dominant/non-dominant: Dominant Hemiplegia - caused by: Cerebral infarction    Time: 1049-1130 PT Time Calculation (min) (ACUTE ONLY): 41 min   Charges:   PT Evaluation $PT Eval Moderate Complexity: 1 Mod PT Treatments $Therapeutic Activity: 23-37 mins          Weston Anna, PT Acute Rehabilitation Services Pager: [phone] Office: [phone]

## 2019-01-18 NOTE — Progress Notes (Signed)
Inpatient Rehabilitation Admissions Coordinator  Inpatient rehab consult received. I reviewed chart and attempted to reach pt by phone without success this afternoon. I will follow up with rehab assessment tomorrow.  Danne Baxter, RN, MSN Rehab Admissions Coordinator [phone] 01/18/2019 4:41 PM

## 2019-01-18 NOTE — Progress Notes (Signed)
PROGRESS NOTE    Scott Mcdowell  ZOX:[id number] DOB: 1940-10-30 DOA: 01/14/2019 PCP: Patient, No Pcp Per   Brief Narrative:  Scott Mcdowell  is a 78 year old gentleman with a history of CVA, hyperlipidemia and hypertension Was seen by his PCP on 01/14/2019 for scrotal swelling and was sent to the ER for further evaluation for possible cellulitis.  Patient was diagnosed with scrotal cellulitis, bilateral hydrocele, right pleural effusion and acute kidney injury And right lower lobe pneumonia/consolidation so he was transferred under hospitalist service at [facility] and was started on antibiotics.  Underwent diagnostic and therapeutic thoracentesis on 01/16/2019.  Dropped his hemoglobin to 6.9 on 01/16/2019 and he received 1 unit of transfusion.  Pleural fluid analysis indicate transudative pleural effusion.  Creatinine has started to climb.  Nephrology was consulted.  Concern for possible nephrotic syndrome or multiple myeloma.  Investigations in progress.  Consultants:   None  Procedures:   None  Antimicrobials:   Cefepime and vancomycin started on 01/14/2019 and vancomycin discontinued on 01/17/2019.   Subjective: Patient seen and examined.  He states that his breathing is much better.  He has no new complaint.  Objective: Vitals:   01/17/19 2049 01/18/19 0408 01/18/19 0600 01/18/19 0958  BP: (!) 99/53 (!) 88/49 (!) 89/58 (!) 94/51  Pulse: 85 75 72 70  Resp: 16 16    Temp: 98.5 F (36.9 C) 98.4 F (36.9 C)    TempSrc: Oral Oral    SpO2: 95% 93%  98%  Weight:   104.3 kg   Height:        Intake/Output Summary (Last 24 hours) at 01/18/2019 1029 Last data filed at 01/18/2019 0945 Gross per 24 hour  Intake 340 ml  Output 800 ml  Net -460 ml   Filed Weights   01/15/19 0022 01/18/19 0600  Weight: 104.6 kg 104.3 kg    Examination:  General exam: Appears calm and comfortable  Respiratory system: Fine crackles at the bases. Respiratory effort  normal. Cardiovascular system: S1 & S2 heard, RRR. No JVD, murmurs, rubs, gallops or clicks. No pedal edema. Gastrointestinal system: Abdomen is nondistended, soft and nontender. No organomegaly or masses felt. Normal bowel sounds heard.  Scrotal edema which is improved significantly compared to yesterday.  No more erythema. Central nervous system: Alert and oriented. No focal neurological deficits. Extremities: Symmetric 5 x 5 power. Skin: No rashes, lesions or ulcers Psychiatry: Judgement and insight appear normal. Mood & affect appropriate.  Data Reviewed: I have personally reviewed following labs and imaging studies  CBC: Recent Labs  Lab 01/15/19 0134 01/16/19 0956 01/17/19 0434 01/18/19 0459  WBC 7.2 7.3 8.7 9.6  NEUTROABS  --  4.6 5.4 5.9  HGB 7.3* 6.9* 7.4* 7.4*  HCT 22.5* 22.2* 23.6* 23.0*  MCV 106.1* 106.7* 104.4* 104.5*  PLT 101* 105* 101* 99*   Basic Metabolic Panel: Recent Labs  Lab 01/15/19 0134 01/16/19 0956 01/17/19 0434 01/18/19 0459  NA 136 138 137 133*  K 5.1 4.3 4.6 5.2*  CL 106 107 106 103  CO2 21* 22 22 20*  GLUCOSE 95 145* 105* 106*  BUN 78* 82* 77* 90*  CREATININE 2.44* 2.61* 2.40* 3.12*  CALCIUM 11.0* 11.1* 10.9* 10.5*  MG 2.8* 2.8*  --   --   PHOS 4.2  --   --   --    GFR: Estimated Creatinine Clearance: 24.7 mL/min (A) (by C-G formula based on SCr of 3.12 mg/dL (H)). Liver Function Tests: Recent Labs  Lab  01/15/19 0134 01/16/19 0956 01/17/19 0434 01/18/19 0459  AST '23 25 23 26  ' ALT '23 23 25 25  ' ALKPHOS 63 59 59 60  BILITOT 1.6* 1.2 0.9 1.0  PROT 6.4* 6.4* 6.2* 6.1*  ALBUMIN 4.1 3.9 3.7 3.7   No results for input(s): LIPASE, AMYLASE in the last 168 hours. No results for input(s): AMMONIA in the last 168 hours. Coagulation Profile: No results for input(s): INR, PROTIME in the last 168 hours. Cardiac Enzymes: Recent Labs  Lab 01/15/19 0134 01/15/19 1242 01/16/19 0956 01/16/19 1538  TROPONINI 0.06* 0.05* 0.07* 0.06*   BNP  (last 3 results) No results for input(s): PROBNP in the last 8760 hours. HbA1C: No results for input(s): HGBA1C in the last 72 hours. CBG: No results for input(s): GLUCAP in the last 168 hours. Lipid Profile: No results for input(s): CHOL, HDL, LDLCALC, TRIG, CHOLHDL, LDLDIRECT in the last 72 hours. Thyroid Function Tests: No results for input(s): TSH, T4TOTAL, FREET4, T3FREE, THYROIDAB in the last 72 hours. Anemia Panel: Recent Labs    01/17/19 1700 01/18/19 0459  VITAMINB12  --  193  TIBC 305  --   IRON 64  --    Sepsis Labs: No results for input(s): PROCALCITON, LATICACIDVEN in the last 168 hours.  Recent Results (from the past 240 hour(s))  Culture, blood (routine x 2) Call MD if unable to obtain prior to antibiotics being given     Status: None (Preliminary result)   Collection Time: 01/15/19  1:34 AM  Result Value Ref Range Status   Specimen Description   Final    BLOOD LEFT HAND Performed at [facility] [address], [place], [place] [address]    Special Requests   Final    BOTTLES DRAWN AEROBIC AND ANAEROBIC Blood Culture adequate volume Performed at [facility] [address], [place], [place] [address]    Culture   Final    NO GROWTH 3 DAYS Performed at [facility], [place] [address], [place], [place] [address]    Report Status PENDING  Incomplete  Culture, blood (routine x 2) Call MD if unable to obtain prior to antibiotics being given     Status: None (Preliminary result)   Collection Time: 01/15/19  1:34 AM  Result Value Ref Range Status   Specimen Description   Final    BLOOD LEFT ARM Performed at [facility] [address], [place], [place] [address]    Special Requests   Final    BOTTLES DRAWN AEROBIC AND ANAEROBIC Blood Culture adequate volume Performed at [facility] [address], [place], [place] [address]    Culture   Final    NO GROWTH 3 DAYS Performed  at [facility], [place] [address] [place], [place] [address]    Report Status PENDING  Incomplete  SARS Coronavirus 2 [facility] order, Performed in [place] hospital lab)     Status: None   Collection Time: 01/15/19  2:55 PM  Result Value Ref Range Status   SARS Coronavirus 2 NEGATIVE NEGATIVE Final    Comment: (NOTE) If result is NEGATIVE SARS-CoV-2 target nucleic acids are NOT DETECTED. The SARS-CoV-2 RNA is generally detectable in upper and lower  respiratory specimens during the acute phase of infection. The lowest  concentration of SARS-CoV-2 viral copies this assay can detect is 250  copies / mL. A negative result does not preclude SARS-CoV-2 infection  and should not be used as  the sole basis for treatment or other  patient management decisions.  A negative result may occur with  improper specimen collection / handling, submission of specimen other  than nasopharyngeal swab, presence of viral mutation(s) within the  areas targeted by this assay, and inadequate number of viral copies  (<250 copies / mL). A negative result must be combined with clinical  observations, patient history, and epidemiological information. If result is POSITIVE SARS-CoV-2 target nucleic acids are DETECTED. The SARS-CoV-2 RNA is generally detectable in upper and lower  respiratory specimens dur ing the acute phase of infection.  Positive  results are indicative of active infection with SARS-CoV-2.  Clinical  correlation with patient history and other diagnostic information is  necessary to determine patient infection status.  Positive results do  not rule out bacterial infection or co-infection with other viruses. If result is PRESUMPTIVE POSTIVE SARS-CoV-2 nucleic acids MAY BE PRESENT.   A presumptive positive result was obtained on the submitted specimen  and confirmed on repeat testing.  While 2019 novel coronavirus  (SARS-CoV-2) nucleic acids may be present in the submitted sample   additional confirmatory testing may be necessary for epidemiological  and / or clinical management purposes  to differentiate between  SARS-CoV-2 and other Sarbecovirus currently known to infect humans.  If clinically indicated additional testing with an alternate test  methodology [phone]) is advised. The SARS-CoV-2 RNA is generally  detectable in upper and lower respiratory sp ecimens during the acute  phase of infection. The expected result is Negative. Fact Sheet for Patients:  StrictlyIdeas.no Fact Sheet for Healthcare Providers: BankingDealers.co.za This test is not yet approved or cleared by the [place] FDA and has been authorized for detection and/or diagnosis of SARS-CoV-2 by FDA under an Emergency Use Authorization (EUA).  This EUA will remain in effect (meaning this test can be used) for the duration of the COVID-19 declaration under Section 564(b)(1) of the Act, 21 U.S.C. section 360bbb-3(b)(1), unless the authorization is terminated or revoked sooner. Performed at [facility], [place] [address], [place], [place] [address]   MRSA PCR Screening     Status: None   Collection Time: 01/15/19  6:15 PM  Result Value Ref Range Status   MRSA by PCR NEGATIVE NEGATIVE Final    Comment:        The GeneXpert MRSA Assay (FDA approved for NASAL specimens only), is one component of a comprehensive MRSA colonization surveillance program. It is not intended to diagnose MRSA infection nor to guide or monitor treatment for MRSA infections. Performed at [facility], [place] [address], [place], [place] [address]   Culture, body fluid-bottle     Status: None (Preliminary result)   Collection Time: 01/16/19  2:24 PM  Result Value Ref Range Status   Specimen Description PLEURAL RIGHT  Final   Special Requests NONE  Final   Culture   Final    NO GROWTH 2 DAYS Performed at [facility] [address] [place], [place] [address]    Report Status PENDING  Incomplete  Gram stain     Status: None (Preliminary result)   Collection Time: 01/16/19  2:24 PM  Result Value Ref Range Status   Specimen Description PLEURAL RIGHT  Final   Special Requests NONE  Final   Gram Stain   Final    WBC PRESENT,BOTH PMN AND MONONUCLEAR NO ORGANISMS SEEN CYTOSPIN SMEAR Performed at [facility], [address], [place], [place] [address]  Report Status PENDING  Incomplete  Respiratory Panel by PCR     Status: None   Collection Time: 01/16/19  3:08 PM  Result Value Ref Range Status   Adenovirus NOT DETECTED NOT DETECTED Final   Coronavirus 229E NOT DETECTED NOT DETECTED Final    Comment: (NOTE) The Coronavirus on the Respiratory Panel, DOES NOT test for the novel  Coronavirus (2019 nCoV)    Coronavirus HKU1 NOT DETECTED NOT DETECTED Final   Coronavirus NL63 NOT DETECTED NOT DETECTED Final   Coronavirus OC43 NOT DETECTED NOT DETECTED Final   Metapneumovirus NOT DETECTED NOT DETECTED Final   Rhinovirus / Enterovirus NOT DETECTED NOT DETECTED Final   Influenza A NOT DETECTED NOT DETECTED Final   Influenza B NOT DETECTED NOT DETECTED Final   Parainfluenza Virus 1 NOT DETECTED NOT DETECTED Final   Parainfluenza Virus 2 NOT DETECTED NOT DETECTED Final   Parainfluenza Virus 3 NOT DETECTED NOT DETECTED Final   Parainfluenza Virus 4 NOT DETECTED NOT DETECTED Final   Respiratory Syncytial Virus NOT DETECTED NOT DETECTED Final   Bordetella pertussis NOT DETECTED NOT DETECTED Final   Chlamydophila pneumoniae NOT DETECTED NOT DETECTED Final   Mycoplasma pneumoniae NOT DETECTED NOT DETECTED Final    Comment: Performed at [facility], [address], [place], [place] [address]  Culture, blood (routine x 2)     Status: None (Preliminary result)   Collection Time: 01/16/19  3:38 PM  Result Value Ref Range Status   Specimen Description   Final    BLOOD LEFT ANTECUBITAL Performed at  [facility], [place] [address], [place], [place] [address]    Special Requests   Final    BOTTLES DRAWN AEROBIC ONLY Blood Culture adequate volume Performed at [facility] [address] [place], [place] [address]    Culture   Final    NO GROWTH 2 DAYS Performed at [facility] [address] [place], [place] [address]    Report Status PENDING  Incomplete  Culture, blood (routine x 2)     Status: None (Preliminary result)   Collection Time: 01/16/19  3:38 PM  Result Value Ref Range Status   Specimen Description   Final    BLOOD RIGHT ANTECUBITAL Performed at [facility], [place] [address], [place], [place] [address]    Special Requests   Final    BOTTLES DRAWN AEROBIC ONLY Blood Culture adequate volume Performed at [facility] [address] [place], [place] [address]    Culture   Final    NO GROWTH 2 DAYS Performed at [facility] [address] [place], [place] [address]    Report Status PENDING  Incomplete      Radiology Studies: Dg Chest 1 View  Result Date: 01/16/2019 CLINICAL DATA:  Post thoracentesis EXAM: CHEST  1 VIEW COMPARISON:  01/14/2019 FINDINGS: Normal heart size. Low volumes. Bibasilar atelectasis. There is no pneumothorax post right thoracentesis. IMPRESSION: No pneumothorax post right thoracentesis. Electronically Signed   By: Marybelle Killings M.D.   On: 01/16/2019 14:42   US Abdomen Complete  Result Date: 01/17/2019 CLINICAL DATA:  Chronic kidney disease EXAM: ABDOMEN ULTRASOUND COMPLETE COMPARISON:  CT 01/14/2019 FINDINGS: Gallbladder: Gallbladder wall is mildly thickened at 5 mm. No visible stones or sonographic Murphy's sign. Common bile duct: Diameter: Normal caliber, 3 mm Liver: Coarsened echotexture and nodular contours of the liver suggests cirrhosis. No focal hepatic abnormality. Portal vein is patent on  color Doppler imaging with normal direction of blood flow towards the liver. IVC:  Prominent IVC and and hepatic veins which can be seen with right heart failure. Pancreas: Visualized portion unremarkable. Spleen: Size and appearance within normal limits. Right Kidney: Length: 12.3 cm. Echogenicity within normal limits. No mass or hydronephrosis. Left Kidney: Length: 11.5 cm. Echogenicity within normal limits. No mass or hydronephrosis. Abdominal aorta: No aneurysm visualized. Other findings: Ascites noted around the liver and spleen. IMPRESSION: Heterogeneous echotexture throughout the liver with subtle nodularity of the contours suggesting cirrhosis. Gallbladder wall thickening. Burtis Junes this is related to liver disease. Perihepatic and perisplenic ascites. Mildly dilated IVC and hepatic veins can be seen with right heart failure. Electronically Signed   By: Rolm Baptise M.D.   On: 01/17/2019 19:25   US Thoracentesis Asp Pleural Space W/img Guide  Result Date: 01/16/2019 INDICATION: Shortness of breath. Possible right lower lobe pneumonia. Right pleural effusion. Request for diagnostic and therapeutic thoracentesis. EXAM: ULTRASOUND GUIDED RIGHT THORACENTESIS MEDICATIONS: 1% lidocaine 10 mL COMPLICATIONS: None immediate. PROCEDURE: An ultrasound guided thoracentesis was thoroughly discussed with the patient and questions answered. The benefits, risks, alternatives and complications were also discussed. The patient understands and wishes to proceed with the procedure. Written consent was obtained. Ultrasound was performed to localize and mark an adequate pocket of fluid in the right chest. The area was then prepped and draped in the normal sterile fashion. 1% Lidocaine was used for local anesthesia. Under ultrasound guidance a 6 Fr Safe-T-Centesis catheter was introduced. Thoracentesis was performed. The catheter was removed and a dressing applied. FINDINGS: A total of approximately 600 mL of clear yellow fluid was removed. Samples were sent to the laboratory as requested by the clinical team.  IMPRESSION: Successful ultrasound guided right thoracentesis yielding 600 mL of pleural fluid. No pneumothorax on post-procedure chest x-ray. Read by: Gareth [place], PA-C Electronically Signed   By: Jerilynn Mages.  Shick M.D.   On: 01/16/2019 14:54    Scheduled Meds:  sodium chloride   Intravenous Once   aspirin  81 mg Oral Daily   atorvastatin  10 mg Oral Daily   furosemide  60 mg Intravenous Q8H   nystatin   Topical TID   vitamin B-12  250 mcg Oral Daily   Continuous Infusions:  ceFEPime (MAXIPIME) IV 2 g (01/17/19 2249)     LOS: 4 days   Assessment & Plan:   Principal Problem:   ARF (acute renal failure) (HCC) Active Problems:   Hypercalcemia   Hyperkalemia   B12 deficiency anemia   Pleural effusion on right   Ascites   CAP (community acquired pneumonia)   Elevated troponin  Scrotal cellulitis/bilateral hydrocele: Significant improvement in edema and cellulitis.  Continue cefepime.  He was started on Lasix 3 times daily by nephrology yesterday.  Community acquired pneumonia of right lower lobe/right pleural effusion: Diagnostic and therapeutic thoracentesis done on 01/16/2019.  Fluid analysis indicate transudate of pleural effusion.  Has normal echo with no systolic or diastolic dysfunction and in the face of elevated and now climbing creatinine, suspicion for possible nephrotic syndrome is high.  Investigations pending.    Inguinal/scrotal fungal/Candida infection: Nystatin powder 3 times daily.  Acute kidney injury versus chronic kidney disease: Presented with creatinine of 2.5 and now creatinine climbing.  Likely chronic kidney disease but cannot be sure since we do not have any previous records.  Good urine output.  Concern for nephrotic syndrome versus multiple myeloma.  Nephrology on board and management per them.  Right-sided  weakness/history of stroke: Continue aspirin and Lipitor.  Macrocytic anemia due to B12 deficiency: Hemoglobin dropped to 6.9 on 01/16/2019 and status  post 1 unit of transfusion.Marland Kitchen  Has B12 deficiency.  Continue B12 supplement and repeat labs in the morning.  fecal occult blood test ordered but is still pending.  Elevated troponin: Slight elevation, likely due to AKI.  No signs of ACS.  Hyperkalemia: Resolved.  Hypercalcemia: Stable.  In the face of anemia, hypercalcemia and elevated creatinine, multiple myeloma is also 1 of the differential diagnosis.  Investigations in progress.  DVT prophylaxis: SCD, avoiding heparin products due to drop in hemoglobin Code Status: Full code Family Communication: Discussed with patient Disposition Plan: To be determined pending clinical improvement   Time spent: 28 minutes   Darliss Cheney, MD [facility] Pager [phone]  If 7PM-7AM, please contact night-coverage [email or web address] Password [facility] 01/18/2019, 10:29 AM

## 2019-01-18 NOTE — Progress Notes (Signed)
Pt BP 89/58 (map 69).  Pt has Lasix 60 mg IV ordered.  Pt also had had only 250 ml out put during the night, bladder scanned has 29 ml in bladder.  Message sent to X. Blount to see if Lasix should be held this morning.  Awaiting any new orders.

## 2019-01-18 NOTE — Progress Notes (Signed)
Pharmacy Antibiotic Note  Scott Mcdowell is a 78 y.o. male admitted on 01/14/2019 with pneumonia, cellulitis, right pleural effusion  Pharmacy has been consulted for cefepime and vancomycin dosing. NKDA.  Today, 01/18/19  Day 4 IV Abxs   Afebrile  WBC 9.6 - WNL  SCr 3.12 - increased. CrCl ~24 mL/min  Plan:  Change cefepime to 1 g IV q12h based on renal function  Follow renal function and cultures  Height: 6\' 2"  (188 cm) Weight: 229 lb 15 oz (104.3 kg) IBW/kg (Calculated) : 82.2  Temp (24hrs), Avg:98.2 F (36.8 C), Min:97.7 F (36.5 C), Max:98.5 F (36.9 C)  Recent Labs  Lab 01/15/19 0134 01/16/19 0552 01/16/19 0956 01/17/19 0434 01/18/19 0459  WBC 7.2  --  7.3 8.7 9.6  CREATININE 2.44*  --  2.61* 2.40* 3.12*  VANCORANDOM  --  13  --   --   --     Estimated Creatinine Clearance: 24.7 mL/min (A) (by C-G formula based on SCr of 3.12 mg/dL (H)).    No Known Allergies  Antimicrobials this admission: 5/28 cefepime >>  5/28 vancomycin >> 5/30 5/29 azithromycin >> 5/31  Dose adjustments this admission: 5/31: cefepime 2 g IV q24h --> 2 g IV q12h 6/1: cefepime 2 g IV q12h --> cefepime 1 g IV q12h  Microbiology results: 5/29 BCx: ngtd 5/30 Blood: ngtd 5/29 MRSA PCR: negative 5/30 respiratory panel: negative 5/29 COVID: negative 5/30 acid fast culture: Sent 5/30 sputum: Sent  Thank you for allowing pharmacy to be a part of this patient's care.  Lenis Noon, PharmD 01/18/2019 10:52 AM

## 2019-01-18 NOTE — Progress Notes (Addendum)
Patient ID: Scott Mcdowell, male   DOB: [birth year], 78 y.o.   MRN: [id number]  [facility] Progress Note   Assessment/ Plan:   1. 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  Renal ultrasound did not show any obstruction and urine electrolytes not convincing for significant intravascular volume contraction.  It is plausible that he suffered relative intravascular volume contraction while on ACE inhibitor with hemodynamically mediated renal injury.  His creatinine is higher today with low volume paracentesis 2 days ago that is unlikely to be the culprit.  He remains with significant scrotal edema and I will maintain him on the current dose of diuresis.  We will check ANCA titer along with complement and ruling out plasma cell dyscrasia. 2.  Volume overload: Attempting diuresis, unimpressive urine output recorded overnight although net negative fluid balance. 3.  Anemia: Mildly depressed iron studies, will give intravenous iron. 4.  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 5.  Hypotension: Monitor carefully with diuresis to avoid aggravating hypotension and further renal injury.  Will start low-dose midodrine.  Subjective:   Reports to be feeling fair, denies any chest pain or shortness of breath   Objective:   BP (!) 94/51 (BP Location: Left Arm)   Pulse 70   Temp 98.4 F (36.9 C) (Oral)   Resp 16   Ht 6\' 2"  (1.88 m)   Wt 104.3 kg   SpO2 98%   BMI 29.52 kg/m   Intake/Output Summary (Last 24 hours) at 01/18/2019 1130 Last data filed at 01/18/2019 0945 Gross per 24 hour  Intake 240 ml  Output 800 ml  Net -560 ml   Weight change:   Physical Exam: Gen: Comfortably resting in bed, watching television CVS: Pulse regular rhythm, normal rate, S1-S2 normal Resp: Diminished breath sounds over bases, no distinct rales or rhonchi Abd: Soft, obese, nontender Ext: Trace-1+ lower extremity edema, scrotal edema  noted  Imaging: Dg Chest 1 View  Result Date: 01/16/2019 CLINICAL DATA:  Post thoracentesis EXAM: CHEST  1 VIEW COMPARISON:  01/14/2019 FINDINGS: Normal heart size. Low volumes. Bibasilar atelectasis. There is no pneumothorax post right thoracentesis. IMPRESSION: No pneumothorax post right thoracentesis. Electronically Signed   By: Marybelle Killings M.D.   On: 01/16/2019 14:42   US Abdomen Complete  Result Date: 01/17/2019 CLINICAL DATA:  Chronic kidney disease EXAM: ABDOMEN ULTRASOUND COMPLETE COMPARISON:  CT 01/14/2019 FINDINGS: Gallbladder: Gallbladder wall is mildly thickened at 5 mm. No visible stones or sonographic Murphy's sign. Common bile duct: Diameter: Normal caliber, 3 mm Liver: Coarsened echotexture and nodular contours of the liver suggests cirrhosis. No focal hepatic abnormality. Portal vein is patent on color Doppler imaging with normal direction of blood flow towards the liver. IVC: Prominent IVC and and hepatic veins which can be seen with right heart failure. Pancreas: Visualized portion unremarkable. Spleen: Size and appearance within normal limits. Right Kidney: Length: 12.3 cm. Echogenicity within normal limits. No mass or hydronephrosis. Left Kidney: Length: 11.5 cm. Echogenicity within normal limits. No mass or hydronephrosis. Abdominal aorta: No aneurysm visualized. Other findings: Ascites noted around the liver and spleen. IMPRESSION: Heterogeneous echotexture throughout the liver with subtle nodularity of the contours suggesting cirrhosis. Gallbladder wall thickening. Burtis Junes this is related to liver disease. Perihepatic and perisplenic ascites. Mildly dilated IVC and hepatic veins can be seen with right heart failure. Electronically Signed   By: Rolm Baptise M.D.   On: 01/17/2019 19:25   US Thoracentesis Asp Pleural  Space W/img Guide  Result Date: 01/16/2019 INDICATION: Shortness of breath. Possible right lower lobe pneumonia. Right pleural effusion. Request for diagnostic and  therapeutic thoracentesis. EXAM: ULTRASOUND GUIDED RIGHT THORACENTESIS MEDICATIONS: 1% lidocaine 10 mL COMPLICATIONS: None immediate. PROCEDURE: An ultrasound guided thoracentesis was thoroughly discussed with the patient and questions answered. The benefits, risks, alternatives and complications were also discussed. The patient understands and wishes to proceed with the procedure. Written consent was obtained. Ultrasound was performed to localize and mark an adequate pocket of fluid in the right chest. The area was then prepped and draped in the normal sterile fashion. 1% Lidocaine was used for local anesthesia. Under ultrasound guidance a 6 Fr Safe-T-Centesis catheter was introduced. Thoracentesis was performed. The catheter was removed and a dressing applied. FINDINGS: A total of approximately 600 mL of clear yellow fluid was removed. Samples were sent to the laboratory as requested by the clinical team. IMPRESSION: Successful ultrasound guided right thoracentesis yielding 600 mL of pleural fluid. No pneumothorax on post-procedure chest x-ray. Read by: Gareth Eagle, PA-C Electronically Signed   By: Jerilynn Mages.  Shick M.D.   On: 01/16/2019 14:54    Labs: BMET Recent Labs  Lab 01/15/19 0134 01/16/19 0956 01/17/19 0434 01/18/19 0459  NA 136 138 137 133*  K 5.1 4.3 4.6 5.2*  CL 106 107 106 103  CO2 21* 22 22 20*  GLUCOSE 95 145* 105* 106*  BUN 78* 82* 77* 90*  CREATININE 2.44* 2.61* 2.40* 3.12*  CALCIUM 11.0* 11.1* 10.9* 10.5*  PHOS 4.2  --   --   --    CBC Recent Labs  Lab 01/15/19 0134 01/16/19 0956 01/17/19 0434 01/18/19 0459  WBC 7.2 7.3 8.7 9.6  NEUTROABS  --  4.6 5.4 5.9  HGB 7.3* 6.9* 7.4* 7.4*  HCT 22.5* 22.2* 23.6* 23.0*  MCV 106.1* 106.7* 104.4* 104.5*  PLT 101* 105* 101* 99*    Medications:    . sodium chloride   Intravenous Once  . aspirin  81 mg Oral Daily  . atorvastatin  10 mg Oral Daily  . furosemide  60 mg Intravenous Q8H  . nystatin   Topical TID  . vitamin B-12  250  mcg Oral Daily   Elmarie Shiley, MD 01/18/2019, 11:30 AM

## 2019-01-19 ENCOUNTER — Inpatient Hospital Stay (HOSPITAL_COMMUNITY): Payer: Medicare Other

## 2019-01-19 DIAGNOSIS — M7989 Other specified soft tissue disorders: Secondary | ICD-10-CM

## 2019-01-19 LAB — CBC WITH DIFFERENTIAL/PLATELET
Abs Immature Granulocytes: 1.05 10*3/uL — ABNORMAL HIGH (ref 0.00–0.07)
Basophils Absolute: 0.1 10*3/uL (ref 0.0–0.1)
Basophils Relative: 1 %
Eosinophils Absolute: 0.1 10*3/uL (ref 0.0–0.5)
Eosinophils Relative: 1 %
HCT: 23.5 % — ABNORMAL LOW (ref 39.0–52.0)
Hemoglobin: 7.5 g/dL — ABNORMAL LOW (ref 13.0–17.0)
Immature Granulocytes: 10 %
Lymphocytes Relative: 14 %
Lymphs Abs: 1.5 10*3/uL (ref 0.7–4.0)
MCH: 33.5 pg (ref 26.0–34.0)
MCHC: 31.9 g/dL (ref 30.0–36.0)
MCV: 104.9 fL — ABNORMAL HIGH (ref 80.0–100.0)
Monocytes Absolute: 1.2 10*3/uL — ABNORMAL HIGH (ref 0.1–1.0)
Monocytes Relative: 12 %
Neutro Abs: 6.5 10*3/uL (ref 1.7–7.7)
Neutrophils Relative %: 62 %
Platelets: 102 10*3/uL — ABNORMAL LOW (ref 150–400)
RBC: 2.24 MIL/uL — ABNORMAL LOW (ref 4.22–5.81)
RDW: 18 % — ABNORMAL HIGH (ref 11.5–15.5)
WBC: 10.4 10*3/uL (ref 4.0–10.5)
nRBC: 0.3 % — ABNORMAL HIGH (ref 0.0–0.2)

## 2019-01-19 LAB — PROTEIN ELECTROPHORESIS, SERUM
A/G Ratio: 1.5 (ref 0.7–1.7)
Albumin ELP: 3.5 g/dL (ref 2.9–4.4)
Alpha-1-Globulin: 0.3 g/dL (ref 0.0–0.4)
Alpha-2-Globulin: 0.6 g/dL (ref 0.4–1.0)
Beta Globulin: 1 g/dL (ref 0.7–1.3)
Gamma Globulin: 0.4 g/dL (ref 0.4–1.8)
Globulin, Total: 2.3 g/dL (ref 2.2–3.9)
Total Protein ELP: 5.8 g/dL — ABNORMAL LOW (ref 6.0–8.5)

## 2019-01-19 LAB — BASIC METABOLIC PANEL
Anion gap: 11 (ref 5–15)
BUN: 103 mg/dL — ABNORMAL HIGH (ref 8–23)
CO2: 18 mmol/L — ABNORMAL LOW (ref 22–32)
Calcium: 10.5 mg/dL — ABNORMAL HIGH (ref 8.9–10.3)
Chloride: 104 mmol/L (ref 98–111)
Creatinine, Ser: 3.98 mg/dL — ABNORMAL HIGH (ref 0.61–1.24)
GFR calc Af Amer: 16 mL/min — ABNORMAL LOW (ref >60)
GFR calc non Af Amer: 13 mL/min — ABNORMAL LOW (ref >60)
Glucose, Bld: 130 mg/dL — ABNORMAL HIGH (ref 70–99)
Potassium: 5.5 mmol/L — ABNORMAL HIGH (ref 3.5–5.1)
Sodium: 133 mmol/L — ABNORMAL LOW (ref 135–145)

## 2019-01-19 LAB — FOLATE RBC
Folate, Hemolysate: 209 ng/mL
Folate, RBC: 933 ng/mL (ref >498)
Hematocrit: 22.4 % — ABNORMAL LOW (ref 37.5–51.0)

## 2019-01-19 LAB — CREATININE, SERUM
Creatinine, Ser: 3.85 mg/dL — ABNORMAL HIGH (ref 0.61–1.24)
GFR calc Af Amer: 16 mL/min — ABNORMAL LOW (ref >60)
GFR calc non Af Amer: 14 mL/min — ABNORMAL LOW (ref >60)

## 2019-01-19 LAB — PTH-RELATED PEPTIDE: PTH-related peptide: <2 pmol/L

## 2019-01-19 MED ORDER — METOLAZONE 5 MG PO TABS
5.0000 mg | ORAL_TABLET | Freq: Once | ORAL | Status: AC
  Administered 2019-01-19: 5 mg via ORAL
  Filled 2019-01-19: qty 1

## 2019-01-19 MED ORDER — MIDODRINE HCL 5 MG PO TABS
5.0000 mg | ORAL_TABLET | Freq: Three times a day (TID) | ORAL | Status: DC
  Administered 2019-01-19 – 2019-01-24 (×8): 5 mg via ORAL
  Filled 2019-01-19 (×10): qty 1

## 2019-01-19 MED ORDER — HEPARIN (PORCINE) 25000 UT/250ML-% IV SOLN
1600.0000 [IU]/h | INTRAVENOUS | Status: DC
  Administered 2019-01-19 – 2019-01-20 (×2): 1600 [IU]/h via INTRAVENOUS
  Filled 2019-01-19 (×2): qty 250

## 2019-01-19 MED ORDER — SALINE SPRAY 0.65 % NA SOLN
1.0000 | NASAL | Status: DC | PRN
  Administered 2019-01-20: 1 via NASAL
  Filled 2019-01-19: qty 44

## 2019-01-19 MED ORDER — FUROSEMIDE 10 MG/ML IJ SOLN
80.0000 mg | Freq: Once | INTRAMUSCULAR | Status: AC
  Administered 2019-01-19: 80 mg via INTRAVENOUS
  Filled 2019-01-19: qty 8

## 2019-01-19 MED ORDER — ALBUMIN HUMAN 25 % IV SOLN
25.0000 g | Freq: Once | INTRAVENOUS | Status: AC
  Administered 2019-01-19: 25 g via INTRAVENOUS
  Filled 2019-01-19: qty 100

## 2019-01-19 MED ORDER — VITAMIN D 25 MCG (1000 UNIT) PO TABS
5000.0000 [IU] | ORAL_TABLET | Freq: Every day | ORAL | Status: DC
  Administered 2019-01-19 – 2019-01-20 (×2): 5000 [IU] via ORAL
  Filled 2019-01-19 (×2): qty 5

## 2019-01-19 MED ORDER — SODIUM CHLORIDE 0.9 % IV SOLN
2.0000 g | INTRAVENOUS | Status: DC
  Administered 2019-01-20: 2 g via INTRAVENOUS
  Filled 2019-01-19 (×2): qty 2

## 2019-01-19 NOTE — Progress Notes (Signed)
Patient ID: Scott Mcdowell, male   DOB: 28-Jul-1941, 78 y.o.   MRN: [id number]  [facility] Progress Note   Assessment/ Plan:   1. 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  Suspected to be hemodynamically mediated (relative renal hypoperfusion in the setting of ACE inhibitor use) with negative renal ultrasound and no clear-cut signs of volume depletion.  Oliguric overnight with extremely poor response to diuretics and continued rise of creatinine.  I am concerned of his elevated lambda light chains with low kappa/lambda ratio (without an M spike) in the setting of hypercalcemia and anemia--I strongly feel that hematology input may help evaluate this further for plasma cell dyscrasia.  Given the lack of clarity regarding his kidney injury, he may need a renal biopsy especially if bone marrow biopsy is not felt to be appropriate.  If renal function continues to worsen and urine output remains poor, will need transfer to [facility] for hemodialysis (unclear if long-term or short-term; patient expressing some reservations for long-term dialysis). 2.  Volume overload: Will reevaluate diuretic strategy and attempt tandem albumin/Lasix along with metolazone.  My big concern with aggressive diuresis is the possibility that if this is truly a light chain nephropathy, we might be aggravating tubular injury. 3.  Anemia: Mildly depressed iron studies, status post intravenous iron 4.  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 5.  Hypotension: Blood pressure soft, monitor closely on diuresis and start midodrine. 6.  Vitamin D deficiency: Begin cholecalciferol supplement  Subjective:   Reports to be feeling fair, denies any chest pain or shortness of breath.  Somewhat frustrated that he is still in the hospital.   Objective:   BP (!) 88/53 (BP Location: Left Arm) Comment: RN notified  Pulse 75   Temp 98.3 F (36.8 C) (Oral)   Resp 20   Ht 6'  2" (1.88 m)   Wt 111.9 kg   SpO2 93%   BMI 31.67 kg/m   Intake/Output Summary (Last 24 hours) at 01/19/2019 1132 Last data filed at 01/19/2019 0600 Gross per 24 hour  Intake 1361 ml  Output 375 ml  Net 986 ml   Weight change: 7.6 kg  Physical Exam: Gen: Comfortably resting in recliner, watching TV CVS: Pulse regular rhythm, normal rate, S1-S2 normal Resp: Diminished breath sounds over bases, no distinct rales or rhonchi Abd: Soft, obese, nontender Ext: Trace-1+ lower extremity edema, scrotal edema noted  Imaging: US Abdomen Complete  Result Date: 01/17/2019 CLINICAL DATA:  Chronic kidney disease EXAM: ABDOMEN ULTRASOUND COMPLETE COMPARISON:  CT 01/14/2019 FINDINGS: Gallbladder: Gallbladder wall is mildly thickened at 5 mm. No visible stones or sonographic Murphy's sign. Common bile duct: Diameter: Normal caliber, 3 mm Liver: Coarsened echotexture and nodular contours of the liver suggests cirrhosis. No focal hepatic abnormality. Portal vein is patent on color Doppler imaging with normal direction of blood flow towards the liver. IVC: Prominent IVC and and hepatic veins which can be seen with right heart failure. Pancreas: Visualized portion unremarkable. Spleen: Size and appearance within normal limits. Right Kidney: Length: 12.3 cm. Echogenicity within normal limits. No mass or hydronephrosis. Left Kidney: Length: 11.5 cm. Echogenicity within normal limits. No mass or hydronephrosis. Abdominal aorta: No aneurysm visualized. Other findings: Ascites noted around the liver and spleen. IMPRESSION: Heterogeneous echotexture throughout the liver with subtle nodularity of the contours suggesting cirrhosis. Gallbladder wall thickening. Burtis Junes this is related to liver disease. Perihepatic and perisplenic ascites. Mildly dilated IVC and hepatic  veins can be seen with right heart failure. Electronically Signed   By: Rolm Baptise M.D.   On: 01/17/2019 19:25    Labs: BMET Recent Labs  Lab  01/15/19 0134 01/16/19 0956 01/17/19 0434 01/18/19 0459 01/19/19 0608 01/19/19 1029  NA 136 138 137 133*  --  133*  K 5.1 4.3 4.6 5.2*  --  5.5*  CL 106 107 106 103  --  104  CO2 21* 22 22 20*  --  18*  GLUCOSE 95 145* 105* 106*  --  130*  BUN 78* 82* 77* 90*  --  PENDING  CREATININE 2.44* 2.61* 2.40* 3.12* 3.85* 3.98*  CALCIUM 11.0* 11.1* 10.9* 10.5*  --  10.5*  PHOS 4.2  --   --   --   --   --    CBC Recent Labs  Lab 01/16/19 0956 01/17/19 0434 01/18/19 0459 01/19/19 0608  WBC 7.3 8.7 9.6 10.4  NEUTROABS 4.6 5.4 5.9 6.5  HGB 6.9* 7.4* 7.4* 7.5*  HCT 22.2* 23.6* 23.0* 23.5*  MCV 106.7* 104.4* 104.5* 104.9*  PLT 105* 101* 99* 102*   Medications:    . sodium chloride   Intravenous Once  . aspirin  81 mg Oral Daily  . atorvastatin  10 mg Oral Daily  . furosemide  60 mg Intravenous Q8H  . nystatin   Topical TID  . vitamin B-12  250 mcg Oral Daily   Elmarie Shiley, MD 01/19/2019, 11:32 AM

## 2019-01-19 NOTE — Evaluation (Signed)
Occupational Therapy Evaluation Patient Details Name: Scott Mcdowell MRN: [id number] DOB: 04-24-1941 Today's Date: 01/19/2019    History of Present Illness 78 yo male admitted with ARF, scrotal cellulitis, Pna. Hx of CVA with R hemiparesis   Clinical Impression   Pt admitted with ARF. Pt currently with functional limitations due to the deficits listed below (see OT Problem List).  Pt will benefit from skilled OT to increase their safety and independence with ADL and functional mobility for ADL to facilitate discharge to venue listed below.   RUE with significant edema.  RN notified.     Follow Up Recommendations  SNF    Equipment Recommendations  None recommended by OT       Precautions / Restrictions Precautions Precautions: Fall Precaution Comments: R hemiparesis Restrictions Weight Bearing Restrictions: No      Mobility Bed Mobility               General bed mobility comments: pt OOB  Transfers Overall transfer level: Needs assistance Equipment used: Rolling walker (2 wheeled) Transfers: Sit to/from Stand Sit to Stand: Mod assist         General transfer comment: Assist to rise, stabilize, block R knee, control descent. VCs safety, technique, hand placement.     Balance Overall balance assessment: Needs assistance         Standing balance support: Bilateral upper extremity supported Standing balance-Leahy Scale: Poor                             ADL either performed or assessed with clinical judgement   ADL Overall ADL's : Needs assistance/impaired Eating/Feeding: Set up;Sitting   Grooming: Minimal assistance;Sitting   Upper Body Bathing: Moderate assistance;Sitting   Lower Body Bathing: Maximal assistance;Sit to/from stand;Cueing for sequencing;Cueing for safety;+2 for physical assistance   Upper Body Dressing : Moderate assistance;Sitting;Cueing for safety;Cueing for sequencing   Lower Body Dressing: Maximal assistance;Sit  to/from stand;Cueing for sequencing;Cueing for safety                                    Pertinent Vitals/Pain Pain Assessment: No/denies pain     Hand Dominance     Extremity/Trunk Assessment Upper Extremity Assessment Upper Extremity Assessment: LUE deficits/detail LUE Deficits / Details: CVA 2004. decreased AROM in all planes of motion.  Noted significant edema. RN notified.  Positioned RUE on 2 pillows            Communication Communication Communication: No difficulties   Cognition Arousal/Alertness: Awake/alert Behavior During Therapy: WFL for tasks assessed/performed Overall Cognitive Status: Within Functional Limits for tasks assessed                                     General Comments   Encouraged pt to keep RUE in elevated position due to edema            Home Living Family/patient expects to be discharged to:: Private residence Living Arrangements: Spouse/significant other Available Help at Discharge: Family Type of Home: House Home Access: Stairs to enter [employer] of Steps: 3 (uses door/frame) to hold on to Entrance Stairs-Rails: None Home Layout: Two level;Able to live on main level with bedroom/bathroom         Bathroom Toilet: Standard     Home Equipment: Gilford Rile -  2 wheels;Cane - single point          Prior Functioning/Environment Level of Independence: Independent with assistive device(s)        Comments: uses RW vs cane PRN. "On a good day" pt can ambulate short distances unassisted.         OT Problem List: Decreased strength;Decreased activity tolerance;Impaired balance (sitting and/or standing)      OT Treatment/Interventions: Self-care/ADL training;Patient/family education;DME and/or AE instruction    OT Goals(Current goals can be found in the care plan section) Acute Rehab OT Goals Patient Stated Goal: to regain PLOF OT Goal Formulation: With patient Time For Goal Achievement:  02/02/19 Potential to Achieve Goals: Good  OT Frequency: Min 2X/week   Barriers to D/C:               AM-PAC OT "6 Clicks" Daily Activity     Outcome Measure Help from another person eating meals?: A Little Help from another person taking care of personal grooming?: A Little Help from another person toileting, which includes using toliet, bedpan, or urinal?: A Lot Help from another person bathing (including washing, rinsing, drying)?: A Lot Help from another person to put on and taking off regular upper body clothing?: A Little Help from another person to put on and taking off regular lower body clothing?: A Lot 6 Click Score: 15   End of Session Equipment Utilized During Treatment: Rolling walker Nurse Communication: Mobility status  Activity Tolerance: Patient limited by fatigue Patient left: in chair;with call bell/phone within reach;with chair alarm set  OT Visit Diagnosis: Unsteadiness on feet (R26.81);Muscle weakness (generalized) (M62.81);Other abnormalities of gait and mobility (R26.89);History of falling (Z91.81);Repeated falls (R29.6)                Time: 7741-4239 OT Time Calculation (min): 30 min Charges:  OT General Charges $OT Visit: 1 Visit  Kari Baars, OT Acute Rehabilitation Services [phone] Office- [phone], Edwena Felty D 01/19/2019, 3:25 PM

## 2019-01-19 NOTE — Progress Notes (Signed)
Pharmacy Antibiotic Note  Scott Mcdowell is a 78 y.o. male admitted on 01/14/2019 with pneumonia, cellulitis, right pleural effusion  Pharmacy has been consulted for cefepime and vancomycin dosing. NKDA.  Today, 01/19/19  Day 5 IV Abxs   Afebrile  WBC 10.4 - WNL  SCr 3.98 - increased. CrCl ~24 mL/min  Plan:  Change cefepime to 2 gm q24  Follow renal function and cultures  Height: 6\' 2"  (188 cm) Weight: 246 lb 11.1 oz (111.9 kg) IBW/kg (Calculated) : 82.2  Temp (24hrs), Avg:98.2 F (36.8 C), Min:98 F (36.7 C), Max:98.3 F (36.8 C)  Recent Labs  Lab 01/15/19 0134 01/16/19 0552 01/16/19 0956 01/17/19 0434 01/18/19 0459 01/19/19 0608 01/19/19 1029  WBC 7.2  --  7.3 8.7 9.6 10.4  --   CREATININE 2.44*  --  2.61* 2.40* 3.12* 3.85* 3.98*  VANCORANDOM  --  13  --   --   --   --   --     Estimated Creatinine Clearance: 20 mL/min (A) (by C-G formula based on SCr of 3.98 mg/dL (H)).    No Known Allergies Antimicrobials this admission: 5/29 zmax >> 5/31 5/28 cefepime >>  5/28 vancomycin >> 5/31  called RPh at [place] she said he received Vanc 2g at 1233 on 5/28 Dose adjustments/levels this admission: 5/30 0552 VR = 13 - last dose of 2g given at [place] on 5/28 at 1233 - ~42 hours since last dose - will start 2g IV q48 - based on SCr 2.44 on 5/29, est AUC of 472, Vd coef 0.7 5/30: increased SCr - changed cefepime from 2g q12 to 2g q24 5/31: Cefepime changed back to 2g q12 due to improved SCr 6/1: Cefepime to 1 g IV q12h. SCr worsening 6/2:  cefepime 2 gm q24  Microbiology results: 5/29 BCx: ngtd 5/30 BCx2: ngtd 5/30 resp PCR neg 5/30 pleural fluid: ngF 5/29 MRSA PCR: negative 5/29 SARS2: negative 5/29 strep pneumo negative 5/29 HIV neg 5/30 legionella negative 5/31 Hep A B C neg  Thank you for allowing pharmacy to be a part of this patient's care.  Eudelia Bunch, Pharm.D [phone] 01/19/2019 12:38 PM

## 2019-01-19 NOTE — Progress Notes (Signed)
PROGRESS NOTE    Scott Mcdowell  WYO:[id number] DOB: 12-10-1940 DOA: 01/14/2019 PCP: Patient, No Pcp Per   Brief Narrative:  Scott Mcdowell  is a 78 year old gentleman with a history of CVA, hyperlipidemia and hypertension Was seen by his PCP on 01/14/2019 for scrotal swelling and was sent to the ER for further evaluation for possible cellulitis.  Patient was diagnosed with scrotal cellulitis, bilateral hydrocele, right pleural effusion and acute kidney injury And right lower lobe pneumonia/consolidation so he was transferred under hospitalist service at [facility] and was started on antibiotics.  Underwent diagnostic and therapeutic thoracentesis on 01/16/2019.  Dropped his hemoglobin to 6.9 on 01/16/2019 and he received 1 unit of transfusion.  Pleural fluid analysis indicate transudative pleural effusion.  Creatinine has started to climb.  Nephrology was consulted.  Concern for possible nephrotic syndrome or multiple myeloma.  Investigations in progress.  Consultants:   None  Procedures:   None  Antimicrobials:   Cefepime and vancomycin started on 01/14/2019 and vancomycin discontinued on 01/17/2019.   Subjective: Patient seen and examined.  He again complains of some shortness of breath but not too bad.  No other complaint.  Objective: Vitals:   01/18/19 1431 01/18/19 1633 01/18/19 2136 01/19/19 0440  BP: (!) 92/52 (!) 94/55 (!) 94/54 (!) 88/53  Pulse: 68 75 78 75  Resp: '16  20 20  ' Temp: 98 F (36.7 C)  98.2 F (36.8 C) 98.3 F (36.8 C)  TempSrc: Oral   Oral  SpO2: 97%  96% 93%  Weight:    111.9 kg  Height:        Intake/Output Summary (Last 24 hours) at 01/19/2019 1140 Last data filed at 01/19/2019 0600 Gross per 24 hour  Intake 1361 ml  Output 375 ml  Net 986 ml   Filed Weights   01/15/19 0022 01/18/19 0600 01/19/19 0440  Weight: 104.6 kg 104.3 kg 111.9 kg    Examination:  General exam: Appears calm and comfortable  Respiratory system:  Bibasilar crackles. Respiratory effort normal. Cardiovascular system: S1 & S2 heard, RRR. No JVD, murmurs, rubs, gallops or clicks. No pedal edema. Gastrointestinal system: Abdomen is nondistended, soft and nontender. No organomegaly or masses felt. Normal bowel sounds heard.  Scrotal edema without erythema. Central nervous system: Alert and oriented. No focal neurological deficits. Extremities: Symmetric 5 x 5 power in the left side and right lower extremity.  Paresthesia of right upper extremity. Skin: No rashes, lesions or ulcers Psychiatry: Judgement and insight appear normal. Mood & affect appropriate.  Data Reviewed: I have personally reviewed following labs and imaging studies  CBC: Recent Labs  Lab 01/15/19 0134 01/16/19 0956 01/17/19 0434 01/18/19 0459 01/19/19 0608  WBC 7.2 7.3 8.7 9.6 10.4  NEUTROABS  --  4.6 5.4 5.9 6.5  HGB 7.3* 6.9* 7.4* 7.4* 7.5*  HCT 22.5*   23.1* 22.2* 23.6* 23.0* 23.5*  MCV 106.1* 106.7* 104.4* 104.5* 104.9*  PLT 101* 105* 101* 99* 774*   Basic Metabolic Panel: Recent Labs  Lab 01/15/19 0134 01/16/19 0956 01/17/19 0434 01/18/19 0459 01/19/19 0608 01/19/19 1029  NA 136 138 137 133*  --  133*  K 5.1 4.3 4.6 5.2*  --  5.5*  CL 106 107 106 103  --  104  CO2 21* 22 22 20*  --  18*  GLUCOSE 95 145* 105* 106*  --  130*  BUN 78* 82* 77* 90*  --  PENDING  CREATININE 2.44* 2.61* 2.40* 3.12* 3.85* 3.98*  CALCIUM  11.0* 11.1* 10.9* 10.5*  --  10.5*  MG 2.8* 2.8*  --   --   --   --   PHOS 4.2  --   --   --   --   --    GFR: Estimated Creatinine Clearance: 20 mL/min (A) (by C-G formula based on SCr of 3.98 mg/dL (H)). Liver Function Tests: Recent Labs  Lab 01/15/19 0134 01/16/19 0956 01/17/19 0434 01/18/19 0459  AST '23 25 23 26  ' ALT '23 23 25 25  ' ALKPHOS 63 59 59 60  BILITOT 1.6* 1.2 0.9 1.0  PROT 6.4* 6.4* 6.2* 6.1*  ALBUMIN 4.1 3.9 3.7 3.7   No results for input(s): LIPASE, AMYLASE in the last 168 hours. No results for input(s):  AMMONIA in the last 168 hours. Coagulation Profile: No results for input(s): INR, PROTIME in the last 168 hours. Cardiac Enzymes: Recent Labs  Lab 01/15/19 0134 01/15/19 1242 01/16/19 0956 01/16/19 1538  TROPONINI 0.06* 0.05* 0.07* 0.06*   BNP (last 3 results) No results for input(s): PROBNP in the last 8760 hours. HbA1C: No results for input(s): HGBA1C in the last 72 hours. CBG: No results for input(s): GLUCAP in the last 168 hours. Lipid Profile: No results for input(s): CHOL, HDL, LDLCALC, TRIG, CHOLHDL, LDLDIRECT in the last 72 hours. Thyroid Function Tests: No results for input(s): TSH, T4TOTAL, FREET4, T3FREE, THYROIDAB in the last 72 hours. Anemia Panel: Recent Labs    01/17/19 1700 01/18/19 0459  VITAMINB12  --  193  TIBC 305  --   IRON 64  --    Sepsis Labs: No results for input(s): PROCALCITON, LATICACIDVEN in the last 168 hours.  Recent Results (from the past 240 hour(s))  Culture, blood (routine x 2) Call MD if unable to obtain prior to antibiotics being given     Status: None (Preliminary result)   Collection Time: 01/15/19  1:34 AM  Result Value Ref Range Status   Specimen Description   Final    BLOOD LEFT HAND Performed at [facility] [address], [place], [place] [address]    Special Requests   Final    BOTTLES DRAWN AEROBIC AND ANAEROBIC Blood Culture adequate volume Performed at [facility] [address], [place], [place] [address]    Culture   Final    NO GROWTH 4 DAYS Performed at [facility], [place] [address] [place], [place] [address]    Report Status PENDING  Incomplete  Culture, blood (routine x 2) Call MD if unable to obtain prior to antibiotics being given     Status: None (Preliminary result)   Collection Time: 01/15/19  1:34 AM  Result Value Ref Range Status   Specimen Description   Final    BLOOD LEFT ARM Performed at [facility] [address] [place] [address]  [place], [place] [address]    Special Requests   Final    BOTTLES DRAWN AEROBIC AND ANAEROBIC Blood Culture adequate volume Performed at [facility] [address], [place],  [address]    Culture   Final    NO GROWTH 4 DAYS Performed at [facility], [place] [address] [place],  [address]    Report Status PENDING  Incomplete  SARS Coronavirus 2 [facility] order, Performed in [place] hospital lab)     Status: None   Collection Time: 01/15/19  2:55 PM  Result Value Ref Range Status   SARS Coronavirus 2 NEGATIVE NEGATIVE Final  Comment: (NOTE) If result is NEGATIVE SARS-CoV-2 target nucleic acids are NOT DETECTED. The SARS-CoV-2 RNA is generally detectable in upper and lower  respiratory specimens during the acute phase of infection. The lowest  concentration of SARS-CoV-2 viral copies this assay can detect is 250  copies / mL. A negative result does not preclude SARS-CoV-2 infection  and should not be used as the sole basis for treatment or other  patient management decisions.  A negative result may occur with  improper specimen collection / handling, submission of specimen other  than nasopharyngeal swab, presence of viral mutation(s) within the  areas targeted by this assay, and inadequate number of viral copies  (<250 copies / mL). A negative result must be combined with clinical  observations, patient history, and epidemiological information. If result is POSITIVE SARS-CoV-2 target nucleic acids are DETECTED. The SARS-CoV-2 RNA is generally detectable in upper and lower  respiratory specimens dur ing the acute phase of infection.  Positive  results are indicative of active infection with SARS-CoV-2.  Clinical  correlation with patient history and other diagnostic information is  necessary to determine patient infection status.  Positive results do  not rule out bacterial infection or co-infection with other viruses. If result is PRESUMPTIVE  POSTIVE SARS-CoV-2 nucleic acids MAY BE PRESENT.   A presumptive positive result was obtained on the submitted specimen  and confirmed on repeat testing.  While 2019 novel coronavirus  (SARS-CoV-2) nucleic acids may be present in the submitted sample  additional confirmatory testing may be necessary for epidemiological  and / or clinical management purposes  to differentiate between  SARS-CoV-2 and other Sarbecovirus currently known to infect humans.  If clinically indicated additional testing with an alternate test  methodology [phone]) is advised. The SARS-CoV-2 RNA is generally  detectable in upper and lower respiratory sp ecimens during the acute  phase of infection. The expected result is Negative. Fact Sheet for Patients:  StrictlyIdeas.no Fact Sheet for Healthcare Providers: BankingDealers.co.za This test is not yet approved or cleared by the [place] FDA and has been authorized for detection and/or diagnosis of SARS-CoV-2 by FDA under an Emergency Use Authorization (EUA).  This EUA will remain in effect (meaning this test can be used) for the duration of the COVID-19 declaration under Section 564(b)(1) of the Act, 21 U.S.C. section 360bbb-3(b)(1), unless the authorization is terminated or revoked sooner. Performed at [facility], [place] [address], [place], [place] [address]   MRSA PCR Screening     Status: None   Collection Time: 01/15/19  6:15 PM  Result Value Ref Range Status   MRSA by PCR NEGATIVE NEGATIVE Final    Comment:        The GeneXpert MRSA Assay (FDA approved for NASAL specimens only), is one component of a comprehensive MRSA colonization surveillance program. It is not intended to diagnose MRSA infection nor to guide or monitor treatment for MRSA infections. Performed at [facility], [place] [address], [place], [place] [address]   Culture, body fluid-bottle      Status: None (Preliminary result)   Collection Time: 01/16/19  2:24 PM  Result Value Ref Range Status   Specimen Description PLEURAL RIGHT  Final   Special Requests NONE  Final   Culture   Final    NO GROWTH 3 DAYS Performed at [facility], [address] [place], [place] [address]    Report Status PENDING  Incomplete  Gram stain     Status:  None   Collection Time: 01/16/19  2:24 PM  Result Value Ref Range Status   Specimen Description PLEURAL RIGHT  Final   Special Requests NONE  Final   Gram Stain   Final    WBC PRESENT,BOTH PMN AND MONONUCLEAR NO ORGANISMS SEEN CYTOSPIN SMEAR Performed at [facility], [address], [place], [place] [address]    Report Status 01/18/2019 FINAL  Final  Respiratory Panel by PCR     Status: None   Collection Time: 01/16/19  3:08 PM  Result Value Ref Range Status   Adenovirus NOT DETECTED NOT DETECTED Final   Coronavirus 229E NOT DETECTED NOT DETECTED Final    Comment: (NOTE) The Coronavirus on the Respiratory Panel, DOES NOT test for the novel  Coronavirus (2019 nCoV)    Coronavirus HKU1 NOT DETECTED NOT DETECTED Final   Coronavirus NL63 NOT DETECTED NOT DETECTED Final   Coronavirus OC43 NOT DETECTED NOT DETECTED Final   Metapneumovirus NOT DETECTED NOT DETECTED Final   Rhinovirus / Enterovirus NOT DETECTED NOT DETECTED Final   Influenza A NOT DETECTED NOT DETECTED Final   Influenza B NOT DETECTED NOT DETECTED Final   Parainfluenza Virus 1 NOT DETECTED NOT DETECTED Final   Parainfluenza Virus 2 NOT DETECTED NOT DETECTED Final   Parainfluenza Virus 3 NOT DETECTED NOT DETECTED Final   Parainfluenza Virus 4 NOT DETECTED NOT DETECTED Final   Respiratory Syncytial Virus NOT DETECTED NOT DETECTED Final   Bordetella pertussis NOT DETECTED NOT DETECTED Final   Chlamydophila pneumoniae NOT DETECTED NOT DETECTED Final   Mycoplasma pneumoniae NOT DETECTED NOT DETECTED Final    Comment: Performed at [facility], [address], [place], [place] [address]  Culture, blood (routine x 2)     Status: None (Preliminary result)   Collection Time: 01/16/19  3:38 PM  Result Value Ref Range Status   Specimen Description   Final    BLOOD LEFT ANTECUBITAL Performed at [facility], [place] [address], [place], [place] [address]    Special Requests   Final    BOTTLES DRAWN AEROBIC ONLY Blood Culture adequate volume Performed at [facility] [address], [place], [place] [address]    Culture   Final    NO GROWTH 3 DAYS Performed at [facility], [place] [address], [place], [place] [address]    Report Status PENDING  Incomplete  Culture, blood (routine x 2)     Status: None (Preliminary result)   Collection Time: 01/16/19  3:38 PM  Result Value Ref Range Status   Specimen Description   Final    BLOOD RIGHT ANTECUBITAL Performed at [facility], [place] [address], [place], [place] [address]    Special Requests   Final    BOTTLES DRAWN AEROBIC ONLY Blood Culture adequate volume Performed at [facility] [address] [place], [place] [address]    Culture   Final    NO GROWTH 3 DAYS Performed at [facility], [place] [address], [place],  [address]    Report Status PENDING  Incomplete      Radiology Studies: US Abdomen Complete  Result Date: 01/17/2019 CLINICAL DATA:  Chronic kidney disease EXAM: ABDOMEN ULTRASOUND COMPLETE COMPARISON:  CT 01/14/2019 FINDINGS: Gallbladder: Gallbladder wall is mildly thickened at 5 mm. No visible stones or sonographic Murphy's sign. Common bile duct: Diameter: Normal caliber, 3 mm Liver: Coarsened echotexture and nodular contours of the liver suggests cirrhosis.  No focal hepatic abnormality. Portal vein is patent on color Doppler imaging with normal direction of blood flow towards the liver. IVC: Prominent IVC and and hepatic veins which can be seen with right heart failure. Pancreas: Visualized portion unremarkable.  Spleen: Size and appearance within normal limits. Right Kidney: Length: 12.3 cm. Echogenicity within normal limits. No mass or hydronephrosis. Left Kidney: Length: 11.5 cm. Echogenicity within normal limits. No mass or hydronephrosis. Abdominal aorta: No aneurysm visualized. Other findings: Ascites noted around the liver and spleen. IMPRESSION: Heterogeneous echotexture throughout the liver with subtle nodularity of the contours suggesting cirrhosis. Gallbladder wall thickening. Burtis Junes this is related to liver disease. Perihepatic and perisplenic ascites. Mildly dilated IVC and hepatic veins can be seen with right heart failure. Electronically Signed   By: Rolm Baptise M.D.   On: 01/17/2019 19:25    Scheduled Meds:  sodium chloride   Intravenous Once   aspirin  81 mg Oral Daily   atorvastatin  10 mg Oral Daily   nystatin   Topical TID   vitamin B-12  250 mcg Oral Daily   Continuous Infusions:  ceFEPime (MAXIPIME) IV 1 g (01/19/19 0929)     LOS: 5 days   Assessment & Plan:   Principal Problem:   ARF (acute renal failure) (HCC) Active Problems:   Hypercalcemia   Hyperkalemia   B12 deficiency anemia   Pleural effusion on right   Ascites   CAP (community acquired pneumonia)   Elevated troponin  Scrotal cellulitis/bilateral hydrocele: Significant improvement in edema and cellulitis.  Continue cefepime.  He was started on Lasix 3 times daily by nephrology.  Community acquired pneumonia of right lower lobe/right pleural effusion: Diagnostic and therapeutic thoracentesis done on 01/16/2019.  Fluid analysis indicate transudate of pleural effusion.  Has normal echo with no systolic or diastolic dysfunction and in the face of elevated and now climbing creatinine, suspicion for possible nephrotic versus multiple myeloma.  Investigations pending.  Inguinal/scrotal fungal/Candida infection: Nystatin powder 3 times daily.  Acute kidney injury versus chronic kidney disease: Presented with  creatinine of 2.5 and now creatinine climbing and up to 4 with slightly diminished urine output.  Likely chronic kidney disease but cannot be sure since we do not have any previous records. Concern for nephrotic syndrome versus multiple myeloma.  Nephrology on board and management per them.  Right-sided weakness/history of stroke: Continue aspirin and Lipitor.  Macrocytic anemia due to B12 deficiency: Hemoglobin dropped to 6.9 on 01/16/2019 and status post 1 unit of transfusion.Marland Kitchen  Has B12 deficiency.  Continue B12 supplement and repeat labs in the morning.  fecal occult blood test ordered but is still pending.  Hemoglobin remains a stable since transfusion.  Elevated troponin: Slight elevation, likely due to AKI.  No signs of ACS.  Hyperkalemia: Resolved.  Hypercalcemia: Stable.  In lieu of anemia, hypercalcemia and elevated creatinine, multiple myeloma is also high on differential diagnosis.  Investigations in progress.  Hypotension: Blood pressure on the borderline.  He remains on Lasix.  No antihypertensives on board.  DVT prophylaxis: SCD, avoiding heparin products due to drop in hemoglobin Code Status: Full code Family Communication: Discussed with patient Disposition Plan: To be determined pending clinical improvement   Time spent: 25 minutes.   Darliss Cheney, MD [facility] Pager [phone]  If 7PM-7AM, please contact night-coverage [email or web address] Password [email or web address] 01/19/2019, 11:40 AM

## 2019-01-19 NOTE — Progress Notes (Signed)
Discussed low BP with Dr. Posey Pronto.  Instructed OK to proceed with IV Lasix 80mg  after completing albumin. Andre Lefort

## 2019-01-19 NOTE — Progress Notes (Signed)
Inpatient Rehabilitation Admissions Coordinator  Noted medical work up ongoing. I will follow his progress with mobilizing to assist with planning dispo. Unclear dispo due to complicated medical course.   Danne Baxter, RN, MSN Rehab Admissions Coordinator [phone] 01/19/2019 2:21 PM

## 2019-01-19 NOTE — Progress Notes (Signed)
ANTICOAGULATION CONSULT NOTE - Initial Consult  Pharmacy Consult for heparin Indication: VTE - right upper arm  No Known Allergies  Patient Measurements: Height: 6\' 2"  (188 cm) Weight: 246 lb 11.1 oz (111.9 kg) IBW/kg (Calculated) : 82.2 Heparin Dosing Weight: 105 kg  Vital Signs: Temp: 97.7 F (36.5 C) (06/02 1334) BP: 93/54 (06/02 1658) Pulse Rate: 71 (06/02 1658)  Labs: Recent Labs    01/17/19 0434 01/18/19 0459 01/19/19 0608 01/19/19 1029  HGB 7.4* 7.4* 7.5*  --   HCT 23.6* 23.0*  22.4* 23.5*  --   PLT 101* 99* 102*  --   CREATININE 2.40* 3.12* 3.85* 3.98*    Estimated Creatinine Clearance: 20 mL/min (A) (by C-G formula based on SCr of 3.98 mg/dL (H)).   Medical History: Past Medical History:  Diagnosis Date  . Stroke [facility])    w right sided weakness    Assessment: Patient admitted with cellulitis and PNA with right pleural effusion. S/p thoracentesis on 01/16/19. Patient also has AKI - nephrology consulted. PMH significant for anemia, hx CVA.   Patient was not on anticoagulants PTA or during this hospital admission.   Significant Events: -5/31: Transfused 1 unit PRBC -6/2: Diagnosed with right upper extremity VTE  Today, 01/19/19  Hgb 7.5 - low but stable for several days  Plt 102 - low but stable during admission  SCr = 4, worsening. CrCl ~ 20 mL/min  Patient on ASA 81 mg PO daily  Per discussion with MD at initiation of heparin drip, will not bolus.  Goal of Therapy:  Heparin level 0.3-0.7 units/ml Monitor platelets by anticoagulation protocol: Yes   Plan:   No bolus per discussion with MD  Start heparin infusion at 1600 units/hr  Check HL level in 8 hours and daily while on heparin  CBC daily  Continue to monitor H&H and platelets  Monitor for signs/symptoms of bleeding  Lenis Noon, PharmD 01/19/2019,5:08 PM

## 2019-01-19 NOTE — Progress Notes (Addendum)
Right upper extremity venous duplex completed. Critical results discussed with Chancy Hurter, RN and Dr. Doristine Bosworth.  Refer to "CV Proc" under chart review to view preliminary results.  01/19/2019 2:41 PM Maudry Mayhew, MHA, RVT, RDCS, RDMS

## 2019-01-20 ENCOUNTER — Inpatient Hospital Stay (HOSPITAL_COMMUNITY): Payer: Medicare Other

## 2019-01-20 ENCOUNTER — Encounter (HOSPITAL_COMMUNITY): Payer: Self-pay | Admitting: Interventional Radiology

## 2019-01-20 DIAGNOSIS — R579 Shock, unspecified: Secondary | ICD-10-CM

## 2019-01-20 HISTORY — PX: IR US GUIDE VASC ACCESS RIGHT: IMG2390

## 2019-01-20 HISTORY — PX: IR FLUORO GUIDE CV LINE RIGHT: IMG2283

## 2019-01-20 LAB — CBC WITH DIFFERENTIAL/PLATELET
Abs Immature Granulocytes: 1.34 10*3/uL — ABNORMAL HIGH (ref 0.00–0.07)
Abs Immature Granulocytes: 1.8 10*3/uL — ABNORMAL HIGH (ref 0.00–0.07)
Basophils Absolute: 0.1 10*3/uL (ref 0.0–0.1)
Basophils Absolute: 0 10*3/uL (ref 0.0–0.1)
Basophils Relative: 1 %
Basophils Relative: 0 %
Eosinophils Absolute: 0.1 10*3/uL (ref 0.0–0.5)
Eosinophils Absolute: 0 10*3/uL (ref 0.0–0.5)
Eosinophils Relative: 1 %
Eosinophils Relative: 0 %
HCT: 24.8 % — ABNORMAL LOW (ref 39.0–52.0)
HCT: 16.4 % — ABNORMAL LOW (ref 39.0–52.0)
Hemoglobin: 7.7 g/dL — ABNORMAL LOW (ref 13.0–17.0)
Hemoglobin: 5.1 g/dL — CL (ref 13.0–17.0)
Immature Granulocytes: 11 %
Immature Granulocytes: 14 %
Lymphocytes Relative: 15 %
Lymphocytes Relative: 16 %
Lymphs Abs: 1.8 10*3/uL (ref 0.7–4.0)
Lymphs Abs: 2.2 10*3/uL (ref 0.7–4.0)
MCH: 33.2 pg (ref 26.0–34.0)
MCH: 32.9 pg (ref 26.0–34.0)
MCHC: 31 g/dL (ref 30.0–36.0)
MCHC: 31.1 g/dL (ref 30.0–36.0)
MCV: 106.9 fL — ABNORMAL HIGH (ref 80.0–100.0)
MCV: 105.8 fL — ABNORMAL HIGH (ref 80.0–100.0)
Monocytes Absolute: 1.3 10*3/uL — ABNORMAL HIGH (ref 0.1–1.0)
Monocytes Absolute: 1.3 10*3/uL — ABNORMAL HIGH (ref 0.1–1.0)
Monocytes Relative: 11 %
Monocytes Relative: 10 %
Neutro Abs: 7.6 10*3/uL (ref 1.7–7.7)
Neutro Abs: 8 10*3/uL — ABNORMAL HIGH (ref 1.7–7.7)
Neutrophils Relative %: 61 %
Neutrophils Relative %: 60 %
Platelets: 103 10*3/uL — ABNORMAL LOW (ref 150–400)
Platelets: 102 10*3/uL — ABNORMAL LOW (ref 150–400)
RBC: 2.32 MIL/uL — ABNORMAL LOW (ref 4.22–5.81)
RBC: 1.55 MIL/uL — ABNORMAL LOW (ref 4.22–5.81)
RDW: 17.8 % — ABNORMAL HIGH (ref 11.5–15.5)
RDW: 17.5 % — ABNORMAL HIGH (ref 11.5–15.5)
WBC: 12.2 10*3/uL — ABNORMAL HIGH (ref 4.0–10.5)
WBC: 13.4 10*3/uL — ABNORMAL HIGH (ref 4.0–10.5)
nRBC: 0.4 % — ABNORMAL HIGH (ref 0.0–0.2)
nRBC: 1.3 % — ABNORMAL HIGH (ref 0.0–0.2)

## 2019-01-20 LAB — CULTURE, BLOOD (ROUTINE X 2)
Culture: NO GROWTH
Culture: NO GROWTH
Special Requests: ADEQUATE
Special Requests: ADEQUATE

## 2019-01-20 LAB — UPEP/UIFE/LIGHT CHAINS/TP, 24-HR UR
% BETA, Urine: 24.7 %
ALPHA 1 URINE: 4 %
Albumin, U: 25.7 %
Alpha 2, Urine: 38.6 %
Free Kappa Lt Chains,Ur: 38.59 mg/L (ref 0.63–113.79)
Free Kappa/Lambda Ratio: 0.02 — ABNORMAL LOW (ref 1.03–31.76)
Free Lambda Lt Chains,Ur: 1984.48 mg/L — ABNORMAL HIGH (ref 0.47–11.77)
GAMMA GLOBULIN URINE: 7 %
M-SPIKE %, Urine: 3.8 % — ABNORMAL HIGH
M-Spike, Mg/24 Hr: 4 mg/24 hr — ABNORMAL HIGH
Total Protein, Urine-Ur/day: 116 mg/24 hr (ref 30–150)
Total Protein, Urine: 23.1 mg/dL
Total Volume: 500

## 2019-01-20 LAB — HEPARIN LEVEL (UNFRACTIONATED)
Heparin Unfractionated: 0.56 IU/mL (ref 0.30–0.70)
Heparin Unfractionated: 0.78 IU/mL — ABNORMAL HIGH (ref 0.30–0.70)

## 2019-01-20 LAB — ANTINUCLEAR ANTIBODIES, IFA: ANA Ab, IFA: NEGATIVE

## 2019-01-20 LAB — RENAL FUNCTION PANEL
Albumin: 3.7 g/dL (ref 3.5–5.0)
Anion gap: 10 (ref 5–15)
BUN: 109 mg/dL — ABNORMAL HIGH (ref 8–23)
CO2: 19 mmol/L — ABNORMAL LOW (ref 22–32)
Calcium: 10.5 mg/dL — ABNORMAL HIGH (ref 8.9–10.3)
Chloride: 102 mmol/L (ref 98–111)
Creatinine, Ser: 4.29 mg/dL — ABNORMAL HIGH (ref 0.61–1.24)
GFR calc Af Amer: 14 mL/min — ABNORMAL LOW (ref >60)
GFR calc non Af Amer: 12 mL/min — ABNORMAL LOW (ref >60)
Glucose, Bld: 102 mg/dL — ABNORMAL HIGH (ref 70–99)
Phosphorus: 6.8 mg/dL — ABNORMAL HIGH (ref 2.5–4.6)
Potassium: 5.7 mmol/L — ABNORMAL HIGH (ref 3.5–5.1)
Sodium: 131 mmol/L — ABNORMAL LOW (ref 135–145)

## 2019-01-20 LAB — HEMOGLOBIN AND HEMATOCRIT, BLOOD
HCT: 15.2 % — ABNORMAL LOW (ref 39.0–52.0)
Hemoglobin: 4.8 g/dL — CL (ref 13.0–17.0)

## 2019-01-20 LAB — MRSA PCR SCREENING: MRSA by PCR: NEGATIVE

## 2019-01-20 LAB — PROTIME-INR
INR: 1.6 — ABNORMAL HIGH (ref 0.8–1.2)
Prothrombin Time: 18.4 seconds — ABNORMAL HIGH (ref 11.4–15.2)

## 2019-01-20 LAB — APTT: aPTT: 76 seconds — ABNORMAL HIGH (ref 24–36)

## 2019-01-20 MED ORDER — SODIUM CHLORIDE 0.9 % IV SOLN
2.0000 g | Freq: Two times a day (BID) | INTRAVENOUS | Status: DC
  Filled 2019-01-20 (×2): qty 2

## 2019-01-20 MED ORDER — LIDOCAINE HCL 1 % IJ SOLN
INTRAMUSCULAR | Status: AC
  Filled 2019-01-20: qty 20

## 2019-01-20 MED ORDER — LIDOCAINE HCL 1 % IJ SOLN
INTRAMUSCULAR | Status: AC | PRN
  Administered 2019-01-20: 10 mL

## 2019-01-20 MED ORDER — HEPARIN (PORCINE) 25000 UT/250ML-% IV SOLN: 1500.0000 [IU]/h | INTRAVENOUS | Status: DC

## 2019-01-20 MED ORDER — SODIUM CHLORIDE 0.9 % IV SOLN: 80.0000 mg | Freq: Once | INTRAVENOUS | Status: DC

## 2019-01-20 MED ORDER — SODIUM CHLORIDE 0.9 % IV SOLN: 8.0000 mg/h | INTRAVENOUS | Status: DC

## 2019-01-20 MED ORDER — SODIUM CHLORIDE 0.9 % IV SOLN
0.3000 ug/kg | Freq: Once | INTRAVENOUS | Status: AC
  Administered 2019-01-20: 33.6 ug via INTRAVENOUS
  Filled 2019-01-20: qty 8.4

## 2019-01-20 MED ORDER — SODIUM CHLORIDE 0.9% FLUSH
10.0000 mL | INTRAVENOUS | Status: DC | PRN
  Administered 2019-01-29 (×2): 10 mL
  Administered 2019-01-30 (×2): 30 mL
  Filled 2019-01-20 (×4): qty 40

## 2019-01-20 MED ORDER — PRISMASOL BGK 4/2.5 32-4-2.5 MEQ/L REPLACEMENT SOLN
Status: DC
  Administered 2019-01-20 – 2019-01-26 (×12): via INTRAVENOUS_CENTRAL
  Filled 2019-01-20 (×18): qty 5000

## 2019-01-20 MED ORDER — PRISMASOL BGK 4/2.5 32-4-2.5 MEQ/L IV SOLN
INTRAVENOUS | Status: DC
  Administered 2019-01-21 – 2019-01-27 (×23): via INTRAVENOUS_CENTRAL
  Filled 2019-01-20 (×50): qty 5000

## 2019-01-20 MED ORDER — PANTOPRAZOLE SODIUM 40 MG IV SOLR
40.0000 mg | Freq: Two times a day (BID) | INTRAVENOUS | Status: DC
  Administered 2019-01-24 – 2019-01-25 (×3): 40 mg via INTRAVENOUS
  Filled 2019-01-20 (×3): qty 40

## 2019-01-20 MED ORDER — ORAL CARE MOUTH RINSE
15.0000 mL | Freq: Two times a day (BID) | OROMUCOSAL | Status: DC
  Administered 2019-01-21: 02:00:00 15 mL via OROMUCOSAL

## 2019-01-20 MED ORDER — CHLORHEXIDINE GLUCONATE CLOTH 2 % EX PADS
6.0000 | MEDICATED_PAD | Freq: Every day | CUTANEOUS | Status: DC
  Administered 2019-01-20 – 2019-01-26 (×7): 6 via TOPICAL

## 2019-01-20 MED ORDER — SODIUM CHLORIDE 0.9% IV SOLUTION: Freq: Once | INTRAVENOUS | Status: DC

## 2019-01-20 MED ORDER — SODIUM ZIRCONIUM CYCLOSILICATE 10 G PO PACK
10.0000 g | PACK | Freq: Two times a day (BID) | ORAL | Status: DC
  Administered 2019-01-20: 10 g via ORAL
  Filled 2019-01-20 (×2): qty 1

## 2019-01-20 MED ORDER — PHENYLEPHRINE HCL-NACL 10-0.9 MG/250ML-% IV SOLN
0.0000 ug/min | INTRAVENOUS | Status: DC
  Administered 2019-01-20: 60 ug/min via INTRAVENOUS
  Administered 2019-01-20: 21:00:00 20 ug/min via INTRAVENOUS
  Filled 2019-01-20 (×3): qty 250

## 2019-01-20 MED ORDER — SODIUM CHLORIDE 0.9 % IV SOLN
80.0000 mg | Freq: Once | INTRAVENOUS | Status: AC
  Administered 2019-01-20: 80 mg via INTRAVENOUS
  Filled 2019-01-20: qty 80

## 2019-01-20 MED ORDER — HEPARIN 1000 UNIT/ML FOR PERITONEAL DIALYSIS
INTRAMUSCULAR | Status: DC | PRN
  Administered 2019-01-20: 4.8 mL via INTRAPERITONEAL

## 2019-01-20 MED ORDER — SODIUM CHLORIDE 0.9 % IV SOLN
8.0000 mg/h | INTRAVENOUS | Status: AC
  Administered 2019-01-20 – 2019-01-23 (×6): 8 mg/h via INTRAVENOUS
  Filled 2019-01-20 (×12): qty 80

## 2019-01-20 MED ORDER — VITAMIN K1 10 MG/ML IJ SOLN
10.0000 mg | Freq: Once | INTRAVENOUS | Status: AC
  Administered 2019-01-20: 22:00:00 10 mg via INTRAVENOUS
  Filled 2019-01-20: qty 1

## 2019-01-20 MED ORDER — HEPARIN SODIUM (PORCINE) 1000 UNIT/ML IJ SOLN
INTRAMUSCULAR | Status: AC
  Filled 2019-01-20: qty 1

## 2019-01-20 MED ORDER — PRISMASOL BGK 4/2.5 32-4-2.5 MEQ/L REPLACEMENT SOLN
Status: DC
  Administered 2019-01-20 – 2019-01-26 (×10): via INTRAVENOUS_CENTRAL
  Filled 2019-01-20 (×10): qty 5000

## 2019-01-20 MED ORDER — OXYMETAZOLINE HCL 0.05 % NA SOLN
1.0000 | Freq: Two times a day (BID) | NASAL | Status: DC
  Filled 2019-01-20: qty 15

## 2019-01-20 MED ORDER — SODIUM CHLORIDE 0.9 % FOR CRRT
INTRAVENOUS_CENTRAL | Status: DC | PRN
  Administered 2019-01-20: 21:00:00 via INTRAVENOUS_CENTRAL
  Filled 2019-01-20 (×2): qty 1000

## 2019-01-20 MED ORDER — PANTOPRAZOLE SODIUM 40 MG IV SOLR: 40.0000 mg | Freq: Two times a day (BID) | INTRAVENOUS | Status: DC

## 2019-01-20 NOTE — Progress Notes (Addendum)
[place] Progress Note Patient Name: Scott Mcdowell DOB: 19-Jun-1941 MRN: [id number]   Date of Service  01/20/2019  HPI/Events of Note  Coagulopathy - INR = 1.6 and PTT = 76.  eICU Interventions  Will transfuse 3 units FFP.      Intervention Category Major Interventions: Other:  [facility] Cornelia Copa 01/20/2019, 9:10 PM

## 2019-01-20 NOTE — Progress Notes (Signed)
Physical Therapy Treatment Patient Details Name: Scott Mcdowell MRN: [id number] DOB: [birth year] Today's Date: 01/20/2019    History of Present Illness 78 yo male admitted with ARF, scrotal cellulitis, Pna. Hx of CVA with R hemiparesis    PT Comments    Pt supine and willing to participate.  Pt with excessive nose bleed, RN aware and vitals WNL.  Pt with mod A with bed mobility especially for Rt UE/LE hemiparesis, caution with Rt UE movements during transfer.  Max A +2 for safety with transfer training to chair.  Elevated bed height to assist with standing.  Rt knee buckled, therapist had to block to prevent fall. EOS pt left in chair with call bell within reach and chair alarm set.  Rt UE support with extra pillow for edema control.  Pt limited by fatigue, no reports of pain.   Follow Up Recommendations  CIR     Equipment Recommendations  None recommended by PT    Recommendations for Other Services Rehab consult;OT consult     Precautions / Restrictions Precautions Precautions: Fall Precaution Comments: R hemiparesis Restrictions Weight Bearing Restrictions: No    Mobility  Bed Mobility Overal bed mobility: Needs Assistance Bed Mobility: Rolling;Supine to Sit Rolling: Mod assist   Supine to sit: Min assist;HOB elevated(Assisted Rt UE)        Transfers Overall transfer level: Needs assistance Equipment used: Rolling walker (2 wheeled) Transfers: Sit to/from [facility] Sit to Stand: Mod assist;+2 physical assistance;+2 safety/equipment;From elevated surface Stand pivot transfers: Max assist;+2 physical assistance;+2 safety/equipment;From elevated surface       General transfer comment: Assist to rise, stabilize, block R knee, control descent. VCs safety, technique, hand placement.   Ambulation/Gait                 Stairs             Wheelchair Mobility    Modified Rankin (Stroke Patients Only)       Balance                                             Cognition Arousal/Alertness: Awake/alert Behavior During Therapy: WFL for tasks assessed/performed Overall Cognitive Status: Within Functional Limits for tasks assessed                                        Exercises      General Comments        Pertinent Vitals/Pain Pain Assessment: No/denies pain    Home Living                      Prior Function            PT Goals (current goals can now be found in the care plan section)      Frequency    Min 3X/week      PT Plan Current plan remains appropriate    Co-evaluation              AM-PAC PT "6 Clicks" Mobility   Outcome Measure  Help needed turning from your back to your side while in a flat bed without using bedrails?: A Lot Help needed moving from lying on your back to sitting on the side of a flat bed  without using bedrails?: A Lot Help needed moving to and from a bed to a chair (including a wheelchair)?: A Lot Help needed standing up from a chair using your arms (e.g., wheelchair or bedside chair)?: A Lot Help needed to walk in hospital room?: A Lot Help needed climbing 3-5 steps with a railing? : Total 6 Click Score: 11    End of Session Equipment Utilized During Treatment: Gait belt Activity Tolerance: Patient tolerated treatment well Patient left: in chair;with call bell/phone within reach;with chair alarm set;with nursing/sitter in room Nurse Communication: Mobility status PT Visit Diagnosis: Muscle weakness (generalized) (M62.81);Difficulty in walking, not elsewhere classified (R26.2);Hemiplegia and hemiparesis Hemiplegia - Right/Left: Right Hemiplegia - dominant/non-dominant: Dominant Hemiplegia - caused by: Cerebral infarction     Time: 1320-1350 PT Time Calculation (min) (ACUTE ONLY): 30 min  Charges:  $Therapeutic Activity: 23-37 mins                     [address] LPTA;  CBIS [phone]   Aldona Lento 01/20/2019, 3:00 PM

## 2019-01-20 NOTE — Progress Notes (Addendum)
Scott Mcdowell for heparin Indication: VTE - right upper arm  No Known Allergies  Patient Measurements: Height: 6\' 2"  (188 cm) Weight: 250 lb 3.6 oz (113.5 kg) IBW/kg (Calculated) : 82.2 Heparin Dosing Weight: 105 kg  Vital Signs: Temp: 97.7 F (36.5 C) (06/03 0633) Temp Source: Oral (06/03 5909) BP: 103/55 (06/03 3112) Pulse Rate: 70 (06/03 0633)  Labs: Recent Labs    01/18/19 0459 01/19/19 0608 01/19/19 1029 01/20/19 0133 01/20/19 0813  HGB 7.4* 7.5*  --  7.7*  --   HCT 23.0*  22.4* 23.5*  --  24.8*  --   PLT 99* 102*  --  103*  --   HEPARINUNFRC  --   --   --  0.56 0.78*  CREATININE 3.12* 3.85* 3.98* 4.29*  --     Estimated Creatinine Clearance: 18.7 mL/min (A) (by C-G formula based on SCr of 4.29 mg/dL (H)).   Medical History: Past Medical History:  Diagnosis Date  . Stroke [facility])    w right sided weakness    Assessment: Patient admitted with cellulitis and PNA with right pleural effusion. S/p thoracentesis on 01/16/19. Patient also has AKI - nephrology consulted. PMH significant for anemia, hx CVA.   Patient was not on anticoagulants PTA or during this hospital admission.   Significant Events: -5/31: Transfused 1 unit PRBC -6/2: Diagnosed with right upper extremity VTE  Today, 01/20/19  Confirmatory heparin level is slightly elevated at 0.78 on 1600 units/hr No bleeding reported  Hgb 7.7 - low but stable for several days  Plt 103 - low but stable during admission  SCr = 4.29, worsening. CrCl ~ 19 mL/min  Patient on ASA 81 mg PO daily  Goal of Therapy:  Heparin level 0.3-0.7 units/ml Monitor platelets by anticoagulation protocol: Yes   Plan:   decrease heparin infusion to 1500 units/hr  Check HL level in 8 hours and daily while on heparin  CBC daily  Continue to monitor H&H and platelets  Monitor for signs/symptoms of bleeding  Eudelia Bunch, Pharm.D 01/20/2019 10:18 AM  Addendum: Called by RN. Pt  with nosebleed that is difficult to stop.  Pt vomited 2 large blood clots.  MD is holding heparin for now.   Plan: Heparin off per MD 2nd nosebleed F/u anticoag plans  Eudelia Bunch, Pharm.D [phone] 01/20/2019 11:24 AM

## 2019-01-20 NOTE — Progress Notes (Signed)
Patient continues to have increased work of breathing, though 02 sats are holding above 90%.  BP 85/49, Dr. Cathlean Sauer aware.  Patient has large hematoma in mid-right forearm with puncture site, lab denies venipuncture on this arm.  Patient had enormous bowel movement this afternoon while moving back to bed with physical therapy.  Red blood and clots seen in stool.  Dr. Cathlean Sauer aware.  Dr. Cathlean Sauer to place orders to transfer patient to stepdown at this time for higher level of care.  Patient currently in IR for placement of dialysis catheter.

## 2019-01-20 NOTE — Progress Notes (Signed)
Pharmacy Antibiotic Note  Scott Mcdowell is a 78 y.o. male admitted on 01/14/2019 with pneumonia, cellulitis, right pleural effusion  Pharmacy has been consulted for cefepime and vancomycin dosing. NKDA.  Today, 01/20/19  Day 6 IV Abxs   Afebrile  WBC 12.2 - slightly elevated SCr 4.29 - increased. Starting CRRT tonight.  Plan:  Increase cefepime to 2 gm q12h while on CVVHD  Follow renal function and cultures  Height: 6\' 2"  (188 cm) Weight: 246 lb 0.5 oz (111.6 kg) IBW/kg (Calculated) : 82.2  Temp (24hrs), Avg:97.9 F (36.6 C), Min:97.7 F (36.5 C), Max:98.5 F (36.9 C)  Recent Labs  Lab 01/16/19 0552 01/16/19 0956 01/17/19 0434 01/18/19 0459 01/19/19 0608 01/19/19 1029 01/20/19 0133  WBC  --  7.3 8.7 9.6 10.4  --  12.2*  CREATININE  --  2.61* 2.40* 3.12* 3.85* 3.98* 4.29*  VANCORANDOM 13  --   --   --   --   --   --     Estimated Creatinine Clearance: 18.6 mL/min (A) (by C-G formula based on SCr of 4.29 mg/dL (H)).    No Known Allergies Antimicrobials this admission: 5/29 zmax >> 5/31 5/28 cefepime >>  5/28 vancomycin >> 5/31  called RPh at [facility] she said he received Vanc 2g at 1233 on 5/28 Dose adjustments/levels this admission: 5/30 0552 VR = 13 - last dose of 2g given at [facility] on 5/28 at 1233 - ~42 hours since last dose - will start 2g IV q48 - based on SCr 2.44 on 5/29, est AUC of 472, Vd coef 0.7 5/30: increased SCr - changed cefepime from 2g q12 to 2g q24 5/31: Cefepime changed back to 2g q12 due to improved SCr 6/1: Cefepime to 1 g IV q12h. SCr worsening 6/2:  cefepime 2 gm q24  Microbiology results: 5/29 BCx: ngtd 5/30 BCx2: ngtd 5/30 resp PCR neg 5/30 pleural fluid: ngF 5/29 MRSA PCR: negative 5/29 SARS2: negative 5/29 strep pneumo negative 5/29 HIV neg 5/30 legionella negative 5/31 Hep A B C neg  Thank you for allowing pharmacy to be a part of this patient's care.  Netta Cedars, PharmD, BCPS 01/20/2019 7:40 PM

## 2019-01-20 NOTE — Progress Notes (Signed)
Physical Therapy Treatment Patient Details Name: Scott Mcdowell MRN: [id number] DOB: [birth year] Today's Date: 01/20/2019    History of Present Illness 78 yo male admitted with ARF, scrotal cellulitis, Pna. Hx of CVA with R hemiparesis    PT Comments    Max A +2 required for transfer to [facility] for large bowel movement then to bed.  Pt with increased difficulty standing from lower surface and able to assist with Lt UE only to assist with standing.  Rt knee buckles requiring therapist to block knee to prevent fall.  RN/NT in room at [facility] to assist with cleaning up bowel movement from floor as happened during transfer to [facility].  No reports of pain, was limited by fatigue with task.     Follow Up Recommendations  CIR     Equipment Recommendations  None recommended by PT    Recommendations for Other Services Rehab consult;OT consult     Precautions / Restrictions Precautions Precautions: Fall Precaution Comments: R hemiparesis Restrictions Weight Bearing Restrictions: No    Mobility  Bed Mobility Overal bed mobility: Needs Assistance Bed Mobility: Sit to Supine Rolling: Mod assist   Supine to sit: Min assist;HOB elevated(Assisted Rt UE) Sit to supine: Mod assist      Transfers Overall transfer level: Needs assistance Equipment used: Rolling walker (2 wheeled) Transfers: Stand Pivot Transfers Sit to Stand: Mod assist;+2 physical assistance;+2 safety/equipment;From elevated surface Stand pivot transfers: Max assist;+2 physical assistance;+2 safety/equipment(Transfer from chair to [facility] then bed, Max A)       General transfer comment: VC's for hand placement to assist with STS, max A +2 required and block Rt knee with SPT  Ambulation/Gait                 Stairs             Wheelchair Mobility    Modified Rankin (Stroke Patients Only)       Balance                                            Cognition Arousal/Alertness:  Awake/alert Behavior During Therapy: WFL for tasks assessed/performed Overall Cognitive Status: Within Functional Limits for tasks assessed                                        Exercises      General Comments        Pertinent Vitals/Pain Pain Assessment: No/denies pain    Home Living                      Prior Function            PT Goals (current goals can now be found in the care plan section)      Frequency    Min 3X/week      PT Plan Current plan remains appropriate    Co-evaluation              AM-PAC PT "6 Clicks" Mobility   Outcome Measure  Help needed turning from your back to your side while in a flat bed without using bedrails?: A Lot Help needed moving from lying on your back to sitting on the side of a flat bed without using bedrails?: A Lot Help needed moving  to and from a bed to a chair (including a wheelchair)?: A Lot Help needed standing up from a chair using your arms (e.g., wheelchair or bedside chair)?: A Lot Help needed to walk in hospital room?: A Lot Help needed climbing 3-5 steps with a railing? : Total 6 Click Score: 11    End of Session Equipment Utilized During Treatment: Gait belt Activity Tolerance: Patient tolerated treatment well Patient left: in bed;with call bell/phone within reach;with nursing/sitter in room(RN and NT in room at [facility]) Nurse Communication: Mobility status PT Visit Diagnosis: Muscle weakness (generalized) (M62.81);Difficulty in walking, not elsewhere classified (R26.2);Hemiplegia and hemiparesis Hemiplegia - Right/Left: Right Hemiplegia - dominant/non-dominant: Dominant Hemiplegia - caused by: Cerebral infarction     Time: 1535-1610 PT Time Calculation (min) (ACUTE ONLY): 35 min  Charges:  $Therapeutic Activity: 23-37 mins                     [address] LPTA; CBIS [phone]  Aldona Lento 01/20/2019, 4:14 PM

## 2019-01-20 NOTE — Progress Notes (Addendum)
Patient ID: Scott Mcdowell, male   DOB: 02/01/1941, 78 y.o.   MRN: [id number]  [facility] Progress Note   Assessment/ Plan:   1. 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  Initially suspected to be hemodynamically mediated from renal hypoperfusion with third spacing in the setting of previous ACE inhibitor use.  Attempted diuresis that he has failed and continues to have rising creatinine-I suspect the need for dialysis will be imminent and will consult interventional radiology for assistance with placing a temporary dialysis catheter while he is still relatively stable and able to lay supine for the procedure.  From the available data, suspicion raised for plasma cell disorder with elevated lambda light chain and low kappa/lambda ratio (in the setting of anemia/thrombocytopenia/hypercalcemia/AKI on CKD)-discussed with Dr. Cathlean Sauer to consult hematology for their opinion.  He does not have any uremia. 2.  Volume overload: He has unfortunately failed attempts at diuretic therapy and remains on fluid restriction without any evidence of respiratory distress/pulmonary edema.  He will need extracorporeal volume unloading with dialysis soon. 3.  Hyperkalemia: Secondary to acute kidney injury, treat with Lokelma. 4.  Anemia/thrombocytopenia: Mildly depressed iron studies, status post intravenous iron and will need evaluation for possible plasma cell dyscrasia. 5.  Right arm brachial vein DVT: On heparin drip. 6.  Hypotension: Blood pressure improving on scheduled midodrine. 7.  Vitamin D deficiency: Started yesterday on cholecalciferol supplement  Subjective:   Venous Dopplers/ultrasound from yesterday showed right brachial vein DVT not extending into the subclavian-now on anticoagulation.  Having some epistaxis while on heparin drip.   Objective:   BP (!) 103/55 (BP Location: Left Arm)   Pulse 70   Temp 97.7 F (36.5 C) (Oral)   Resp  20   Ht 6\' 2"  (1.88 m)   Wt 113.5 kg   SpO2 96%   BMI 32.13 kg/m   Intake/Output Summary (Last 24 hours) at 01/20/2019 1051 Last data filed at 01/20/2019 1010 Gross per 24 hour  Intake 775.62 ml  Output 350 ml  Net 425.62 ml   Weight change: 1.6 kg  Physical Exam: Gen: Sitting propped up in bed, nosebleeding CVS: Pulse regular rhythm, normal rate, S1-S2 normal Resp: Diminished breath sounds over bases, no distinct rales or rhonchi Abd: Soft, obese, nontender Ext: Trace-1+ lower extremity edema, scrotal edema noted  Imaging: Vas [place] Upper Extremity Venous Duplex  Result Date: 01/19/2019 UPPER VENOUS STUDY  Indications: Swelling Performing Technologist: Maudry Mayhew MHA, RDMS, RVT, RDCS  Examination Guidelines: A complete evaluation includes B-mode imaging, spectral Doppler, color Doppler, and power Doppler as needed of all accessible portions of each vessel. Bilateral testing is considered an integral part of a complete examination. Limited examinations for reoccurring indications may be performed as noted.  Right Findings: +----------+------------+---------+-----------+----------+-------+ RIGHT     CompressiblePhasicitySpontaneousPropertiesSummary +----------+------------+---------+-----------+----------+-------+ IJV           Full       Yes       Yes                      +----------+------------+---------+-----------+----------+-------+ Subclavian    Full       Yes       Yes                      +----------+------------+---------+-----------+----------+-------+ Axillary      Full       Yes       Yes                      +----------+------------+---------+-----------+----------+-------+  Brachial      None                 No                Acute  +----------+------------+---------+-----------+----------+-------+ Radial        Full                                          +----------+------------+---------+-----------+----------+-------+ Ulnar          Full                                          +----------+------------+---------+-----------+----------+-------+ Cephalic      Full                                          +----------+------------+---------+-----------+----------+-------+ Basilic       Full                                          +----------+------------+---------+-----------+----------+-------+  Left Findings: +----------+------------+---------+-----------+----------+-------+ LEFT      CompressiblePhasicitySpontaneousPropertiesSummary +----------+------------+---------+-----------+----------+-------+ Subclavian               Yes       Yes                      +----------+------------+---------+-----------+----------+-------+  Summary:  Right: No evidence of superficial vein thrombosis in the upper extremity. Findings consistent with acute deep vein thrombosis involving the right brachial veins.  Left: No evidence of thrombosis in the subclavian.  *See table(s) above for measurements and observations.  Diagnosing physician: Deitra Mayo MD Electronically signed by Deitra Mayo MD on 01/19/2019 at 4:52:34 PM.    Final     Labs: BMET Recent Labs  Lab 01/15/19 0134 01/16/19 0017 01/17/19 4944 01/18/19 0459 01/19/19 0608 01/19/19 1029 01/20/19 0133  NA 136 138 137 133*  --  133* 131*  K 5.1 4.3 4.6 5.2*  --  5.5* 5.7*  CL 106 107 106 103  --  104 102  CO2 21* 22 22 20*  --  18* 19*  GLUCOSE 95 145* 105* 106*  --  130* 102*  BUN 78* 82* 77* 90*  --  103* 109*  CREATININE 2.44* 2.61* 2.40* 3.12* 3.85* 3.98* 4.29*  CALCIUM 11.0* 11.1* 10.9* 10.5*  --  10.5* 10.5*  PHOS 4.2  --   --   --   --   --  6.8*   CBC Recent Labs  Lab 01/17/19 0434 01/18/19 0459 01/19/19 0608 01/20/19 0133  WBC 8.7 9.6 10.4 12.2*  NEUTROABS 5.4 5.9 6.5 7.6  HGB 7.4* 7.4* 7.5* 7.7*  HCT 23.6* 23.0*  22.4* 23.5* 24.8*  MCV 104.4* 104.5* 104.9* 106.9*  PLT 101* 99* 102* 103*   Medications:    .  aspirin  81 mg Oral Daily  . atorvastatin  10 mg Oral Daily  . cholecalciferol  5,000 Units Oral Daily  . midodrine  5 mg Oral TID WC  . nystatin   Topical TID  . vitamin B-12  250  mcg Oral Daily   Elmarie Shiley, MD 01/20/2019, 10:51 AM

## 2019-01-20 NOTE — Consult Note (Signed)
NAME:  Scott Mcdowell, MRN:  [id number], DOB:  08/17/1941, LOS: 6 ADMISSION DATE:  01/14/2019, CONSULTATION DATE:  6.3.2020 REFERRING MD:  Hospitalist, [facility] CHIEF COMPLAINT: Hypotension with maroon stools  Brief History   Called to evaluate this patient who was transferred to the ICU for hypotension and blood per rectum.  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 He had been domiciled on the medical floor when was noted he had a large maroon blood bowel movement. He does not endorse abdominal pain.  He does not take any NSAIDs except for aspirin.  He denies any use of Goody powder. He became hypotensive with a blood pressure of 84 systolic and was placed on Neo-Synephrine.  Blood products were ordered because of the use of heparin given for a right upper extremity DVT.  Nursing asked to place NGT and blood obtained. NGT in duodenum.   PMH: CKD-unknown grade, not dialysis dependent until this admission Dyslipidemia Possible Hypertension  Surgical History    Past Surgical History:  Procedure Laterality Date   Tonsillectomy  Child   Thoracentesis Right thorax 01/16/2019  . IR FLUORO GUIDE CV LINE RIGHT Right supraclavicular 01/20/2019     Social History   reports that he has quit smoking. His smoking use included cigarettes. He has never used smokeless tobacco. He reports current alcohol use of about 5.0 standard drinks of alcohol per week.   Family History   His family history includes Cancer in his mother; Parkinson's disease in his father.   Allergies No Known Allergies   Home Medications  Prior to Admission medications   Medication Sig Start Date End Date Taking? Authorizing Provider  aspirin 81 MG chewable tablet Chew 81 mg by mouth daily.   Yes [provider]  atorvastatin (LIPITOR) 10 MG tablet Take 10 mg by mouth daily. 11/12/18  Yes [provider]  ramipril (ALTACE) 2.5 MG capsule Take 2.5 mg by mouth daily. 11/12/18  Yes [provider]    Review of Systems:   Denies abdominal pain.  No hematemesis nausea or vomiting No fevers, sweats, chills. Has chronic shortness of breath but not worse today Denies chest pain Denies headache or visual loss No bleeding from nose or other orifices He does still make urine Has chronic intermittent swelling of lower extremity and has developed gait dysfunction  Consults:  Nephrology IR Critical Care---- GI  Procedures:  Placement of tunneled dialysis catheter 6.30.2020 Thoracentesis: 5.30.2020  Significant Diagnostic Tests:  Pleural fluid analysis  Micro Data:  Reviewed. None pertinent  Antimicrobials:  Cefepime for pneumonia   Objective   Blood pressure (!) 92/48, pulse 86, temperature 97.8 F (36.6 C), temperature source Oral, resp. rate (!) 21, height 6\' 2"  (1.88 m), weight 111.6 kg, SpO2 100 %. CVP:  [17 mmHg] 17 mmHg      Intake/Output Summary (Last 24 hours) at 01/20/2019 2215 Last data filed at 01/20/2019 2200 Gross per 24 hour  Intake 549.4 ml  Output 144 ml  Net 405.4 ml   Filed Weights   01/19/19 0440 01/20/19 0700 01/20/19 1921  Weight: 111.9 kg 113.5 kg 111.6 kg    Examination: General: Chronically and critically ill-appearing 78 year old male no active distress.  He is lethargic but arousable answers questions slowly and in 2-3 word answers  HENT: Neck is supple.  There is a right supraclavicular tunnel catheter in place that is clean dry  intact no erythema or hematoma.  There is no nystagmus.  There is no scleral icterus.  Pupils are equal and reactive Lungs: Decreased breath sounds right greater than left.  Mild rhonchi Cardiovascular: Heart rate is 86.  There is no audible murmur or gallops. Abdomen: Soft, nondistended hyperactive bowel sounds are noted.  NG tube placed by nurse at bedside under my observation.  Blood obtained. Rectum:  No visible hemorrhoids however there is extensive amount of blood per rectum. Extremities: Swelling in the right upper extremity and lower extremities.  There is no mottling.  Pulses are diminished but capillary refill is intact Neuro: Glasgow Coma Scale is 4- 5- 6. No focal deficits except for baseline residual right arm weakness GU: No Foley in place.  No active lesions.   Assessment & Plan:  #1.  Hypovolemic hypotension #2.  Hypotension secondary to GI hemorrhage #3.  Acute blood loss anemia secondary to #4 #4.  Upper GI bleed #5.  Acquired coagulopathy secondary to heparin #6.  CKD 5 now on dialysis #7.  Mild hyperkalemia #8. Recurrent pleural effusions  Respiratory: Patient's respiratory status is stable at this point however should he need EGD would recommend intubation.  The current plan is that the patient will have an EGD once the coagulopathy has improved.  Please see GI below.  Patient has recurrent pleural effusion and would recommend repeat thoracentesis. Follow cytology  Infectious: Have ordered procalcitonin to evaluate need for cefepime.  Watch for encephalopathy which can occur with cefepime.  Cardiovascular: Patient has hypotension presumably from GI hemorrhage.  Will wean neo-as blood products are given.  We will attempt to place arterial catheter to ascertain real pressure.  Patient had been on ACE inhibitor prior to presentation.  Continue to monitor in ICU for hemodynamic compromise  Hematologic/Oncologic: Patient had anemia on presentation.  He will be given blood products.  Have ordered FFP, vitamin K and DDAVP because of his platelet dysfunction.  He is currently receiving CVVH and have notified the nephrology service.  Patient has an acquired coagulopathy secondary to the heparin use.   Endocrine: No active issues continue to monitor accordingly  Renal: Patient has CKD 5 and is now on dialysis.  Have notified the nephrology service (Dr. Augustin Coupe) of patient's change in  status.  They are aware they are also aware that he is receiving blood products.  He has mild pulmonary edema but not significant.  Would not recommend excessive fluid removal.  Metabolic: Patient has mild hyperkalemia presumably CVVH will help with this.  Alimentary/GI: Patient has obvious upper GI bleed.  Given his coagulopathy we will attempt to improve this.  I have ordered Protonix's.  I have spoken to the gastroenterology service and they are aware of the patient.  The plan will be for eventual EGD but the services asked for stabilization of his coagulation parameters and blood products.  Would recommend strong consideration for intubation if EGD is needed  Neurologic: Patient has baseline residual stroke partial functional loss of the right arm.  No other active issues.  He is at risk for further decline if his hypotension ensues.  Other: Patient to be maintained in the ICU for further monitoring and care.  Best practice:  Diet: N.p.o. for now Pain/Anxiety/Delirium protocol (if indicated): Monitor for delirium DVT prophylaxis: No chemical prophylaxis. SCD ordered GI prophylaxis: On Protonix drip Glucose control: No active monitoring Mobility: To be determined after acute crisis has improved Code Status: Full Family Communication: Spoke with wife, [name] and daughter  Mirian Mo number is [phone], Mary's phone [phone] Disposition: To be domiciled in ICU  Labs   CBC: Recent Labs  Lab 01/17/19 0434 01/18/19 0459 01/19/19 0608 01/20/19 0133 01/20/19 2021  WBC 8.7 9.6 10.4 12.2* 13.4*  NEUTROABS 5.4 5.9 6.5 7.6 8.0*  HGB 7.4* 7.4* 7.5* 7.7* 5.1*  HCT 23.6* 23.0*  22.4* 23.5* 24.8* 16.4*  MCV 104.4* 104.5* 104.9* 106.9* 105.8*  PLT 101* 99* 102* 103* 102*    Basic Metabolic Panel: Recent Labs  Lab 01/15/19 0134 01/16/19 0956 01/17/19 0434 01/18/19 0459 01/19/19 0608 01/19/19 1029 01/20/19 0133  NA 136 138 137 133*  --  133* 131*  K 5.1 4.3 4.6 5.2*  --   5.5* 5.7*  CL 106 107 106 103  --  104 102  CO2 21* 22 22 20*  --  18* 19*  GLUCOSE 95 145* 105* 106*  --  130* 102*  BUN 78* 82* 77* 90*  --  103* 109*  CREATININE 2.44* 2.61* 2.40* 3.12* 3.85* 3.98* 4.29*  CALCIUM 11.0* 11.1* 10.9* 10.5*  --  10.5* 10.5*  MG 2.8* 2.8*  --   --   --   --   --   PHOS 4.2  --   --   --   --   --  6.8*   GFR: Estimated Creatinine Clearance: 18.6 mL/min (A) (by C-G formula based on SCr of 4.29 mg/dL (H)). Recent Labs  Lab 01/18/19 0459 01/19/19 0608 01/20/19 0133 01/20/19 2021  WBC 9.6 10.4 12.2* 13.4*    Liver Function Tests: Recent Labs  Lab 01/15/19 0134 01/16/19 0956 01/17/19 0434 01/18/19 0459 01/20/19 0133  AST 23 25 23 26   --   ALT 23 23 25 25   --   ALKPHOS 63 59 59 60  --   BILITOT 1.6* 1.2 0.9 1.0  --   PROT 6.4* 6.4* 6.2* 6.1*  --   ALBUMIN 4.1 3.9 3.7 3.7 3.7   No results for input(s): LIPASE, AMYLASE in the last 168 hours. No results for input(s): AMMONIA in the last 168 hours.  ABG No results found for: PHART, PCO2ART, PO2ART, HCO3, TCO2, ACIDBASEDEF, O2SAT   Coagulation Profile: Recent Labs  Lab 01/20/19 2021  INR 1.6*    Cardiac Enzymes: Recent Labs  Lab 01/15/19 0134 01/15/19 1242 01/16/19 0956 01/16/19 1538  TROPONINI 0.06* 0.05* 0.07* 0.06*    HbA1C: No results found for: HGBA1C  CBG: No results for input(s): GLUCAP in the last 168 hours.   Critical care time: 90 minutes     This patient  is critically ill with hypotension and UGI bleed and requires high complexity decision making for assessment and support, frequent evaluation and titration of therapies, application of advanced monitoring technologies and extensive interpretation of multiple databases.     Critical Care Time devoted to patient care services described in this note is 90 minutes. This time reflects time of care of this author and multiple consult discussions as well as family updates by Dr. Irene Pap, DO FCCP.  This critical  care time does not reflect procedure time, or teaching time or supervisory time of PA/NP/Med student/Med Resident etc but could involve care discussion time        Irene Pap, DO [place], [facility] [place]

## 2019-01-20 NOTE — Progress Notes (Signed)
   Patient Status: [facility] - In-pt  Assessment and Plan: Patient in need of venous access for dialysis.  Patient with fluid overload, oliguric AKI, acidosis with worsening renal function.   Patient with right upper extremity DVT was previously on heparin, but developed nose bleed.  Heparin has been stopped.   Risks and benefits discussed with the patient including, but not limited to bleeding, infection, vascular injury, pneumothorax which may require chest tube placement, air embolism or even death  All of the patient's questions were answered, patient is agreeable to proceed. Consent signed and in chart.  Plan for temporary HD catheter today.  ______________________________________________________________________   History of Present Illness: Scott Mcdowell is a 78 y.o. male with history of CVA and HTN who presented to [facility] with pleural effusion and renal failure.  Patient s/p thoracentesis 5/30.  He continues with edema and oliguria. Request is made for temporary dialysis catheter placement.   Allergies and medications reviewed.   Review of Systems: A 12 point ROS discussed and pertinent positives are indicated in the HPI above.  All other systems are negative.  Review of Systems  Constitutional: Negative for fatigue and fever.  Respiratory: Positive for cough and shortness of breath.   Cardiovascular: Negative for chest pain.  Gastrointestinal: Negative for abdominal pain, nausea and vomiting.  Genitourinary: Positive for decreased urine volume.  Musculoskeletal: Negative for back pain.  Psychiatric/Behavioral: Negative for behavioral problems and confusion.    Vital Signs: BP (!) 85/49 (BP Location: Left Arm) Comment: Re-checked: 87/47 RN Archer Asa has been notified  Pulse 80   Temp 98.5 F (36.9 C) (Oral)   Resp 17   Ht 6\' 2"  (1.88 m)   Wt 250 lb 3.6 oz (113.5 kg)   SpO2 99%   BMI 32.13 kg/m   Physical Exam Vitals signs and nursing note reviewed.   Constitutional:      Appearance: Normal appearance. He is normal weight.  Cardiovascular:     Rate and Rhythm: Normal rate and regular rhythm.     Heart sounds: No murmur. No friction rub. No gallop.   Pulmonary:     Effort: No respiratory distress.     Comments: Increased work of breathing, on 2L O2 Neurological:     Mental Status: He is alert.     Imaging reviewed.   Labs:  COAGS: No results for input(s): INR, APTT in the last 8760 hours.  BMP: Recent Labs    01/17/19 0434 01/18/19 0459 01/19/19 0608 01/19/19 1029 01/20/19 0133  NA 137 133*  --  133* 131*  K 4.6 5.2*  --  5.5* 5.7*  CL 106 103  --  104 102  CO2 22 20*  --  18* 19*  GLUCOSE 105* 106*  --  130* 102*  BUN 77* 90*  --  103* 109*  CALCIUM 10.9* 10.5*  --  10.5* 10.5*  CREATININE 2.40* 3.12* 3.85* 3.98* 4.29*  GFRNONAA 25* 18* 14* 13* 12*  GFRAA 29* 21* 16* 16* 14*       Electronically Signed: Docia Barrier, PA 01/20/2019, 3:39 PM   I spent a total of 15 minutes in face to face in clinical consultation, greater than 50% of which was counseling/coordinating care for venous access.

## 2019-01-20 NOTE — Progress Notes (Signed)
Patient had another large BM with blood prior to transfer to ICU. Pt vitals O2 100% on 2L via [place], HR 88, BP 111/59.  Patient has declined since this afternoon.  Pt transferred to ICU.

## 2019-01-20 NOTE — Progress Notes (Signed)
eLink Physician-Brief Progress Note Patient Name: Scott Mcdowell DOB: 02/06/1941 MRN: [id number]   Date of Service  01/20/2019  HPI/Events of Note  Called by nursing d/t large, dark red BM. BP is soft at 86//58 with MAP of 64. Note that this is a [facility] patient.   eICU Interventions  Will order: 1. Monitor CVP now and Q 4 hours.  2. D/C ASA. 3. CBC with platelets, PT/INR and PTT now. 4. Protonix IV load and infusion. 5. Phenylephrine IV infusion (if needed). 6. Further management per [facility].   PCCM available to see patient in consultation if requested.      Intervention Category Major Interventions: Other:  Lysle Dingwall 01/20/2019, 8:17 PM

## 2019-01-20 NOTE — Progress Notes (Signed)
ANTICOAGULATION CONSULT NOTE - Follow Up Consult  Pharmacy Consult for Heparin Indication: VTE - right upper arm  No Known Allergies  Patient Measurements: Height: 6\' 2"  (188 cm) Weight: 246 lb 11.1 oz (111.9 kg) IBW/kg (Calculated) : 82.2 Heparin Dosing Weight:   Vital Signs: Temp: 97.8 F (36.6 C) (06/03 0234) Temp Source: Oral (06/03 0234) BP: 97/53 (06/03 0234) Pulse Rate: 68 (06/03 0234)  Labs: Recent Labs    01/18/19 0459 01/19/19 0608 01/19/19 1029 01/20/19 0133  HGB 7.4* 7.5*  --  7.7*  HCT 23.0*  22.4* 23.5*  --  24.8*  PLT 99* 102*  --  103*  HEPARINUNFRC  --   --   --  0.56  CREATININE 3.12* 3.85* 3.98* 4.29*    Estimated Creatinine Clearance: 18.6 mL/min (A) (by C-G formula based on SCr of 4.29 mg/dL (H)).   Medications:  Infusions:  . ceFEPime (MAXIPIME) IV    . heparin 1,600 Units/hr (01/19/19 1825)    Assessment: Patient with heparin level at goal.  No heparin issues noted.  Goal of Therapy:  Heparin level 0.3-0.7 units/ml Monitor platelets by anticoagulation protocol: Yes   Plan:  Continue heparin drip at current rate Recheck level at 0800  Tyler Deis, [place] 01/20/2019,4:09 AM

## 2019-01-20 NOTE — Progress Notes (Signed)
Pt seen, awake, alert, on 2lnc.  HR86, rr21, spo2 100%.  Pt states he is not having any difficulty breathing, but mild abdominal muscle use is noted.  Bipap placed in room on standby if needed, but not indicated at this time.

## 2019-01-20 NOTE — Progress Notes (Signed)
Patient has had a steady nosebleed all morning since beginning of shift.  It has been controllable; however patient vomited two large blood clots.  Dr. Cathlean Sauer is aware; heparin drip to be stopped temporarily at this time.  Discussed with pharmacy also Leodis Sias) and they are aware.  Patient also appears to have some increased work of breathing this morning, though vital signs and patient are stable at this time. Dr. Posey Pronto rounding and informed; states low threshold for transfer to [facility] for dialysis and he is to place orders for dialysis catheter insertion via IR. MDs also aware that patient generally states he does not feel good today, feels worse than yesterday.  Patient is alert and oriented at this time.  Will continue to monitor.

## 2019-01-20 NOTE — Progress Notes (Addendum)
Patient with worsening dyspnea, increased work of breathing, oxygen saturation low 90%.. Chest radiograph with worsening pulmonary edema (personally reviewed). Acute hypoxic respiratory failure. Patient transferred to the ICU, will order Bipap to target oxygen saturation greater than 92%.   I spoke with Dr. Augustin Coupe from nephrology, plan to start urgent CRT tonight.

## 2019-01-20 NOTE — Progress Notes (Signed)
PROGRESS NOTE    Scott Mcdowell  HUT:[id number] DOB: 05-19-1941 DOA: 01/14/2019 PCP: [name], MD    Brief Narrative:  78 year old male who presented with worsening edema.  He has a significant past medical history of CVA.  Patient reported dyspnea for several weeks, no fever, chest pain or palpitations.  109/57, heart rate 91, temperature 98, oxygen saturation 100%.  Lungs have decreased breath sounds bilaterally, rales at the left base, heart S1-S2 present and rhythmic, abdomen soft, left lower extremity edema, positive scrotal edema.  Sodium 136, potassium 5.1, chloride 106, bicarb 21, glucose 95, BUN 78, creatinine 2.4, anion gap 9, troponin 0.06.  Chest radiograph with right pleural effusion and increased interstitial markings bilaterally.  EKG had 82 bpm, normal axis, sinus rhythm, right bundle branch block, ST depressions V3 to V6, with T wave inversions V1 to V4.   Patient was admitted to the hospital working diagnosis of right lower lobe pneumonia, complicated by a right parapneumonic pleural effusion and acute kidney injury.    Assessment & Plan:   Principal Problem:   ARF (acute renal failure) (HCC) Active Problems:   Hypercalcemia   Hyperkalemia   B12 deficiency anemia   Pleural effusion on right   Ascites   CAP (community acquired pneumonia)   Elevated troponin   1. Oliguric AKI with hyperkalemia and non gap metabolic acidosis. Worsening renal function, urine output over last 24 H continue to be low down to 250 ml. Persistent peripheral and pulmonary edema. Patient has not responded well to diuresis, with a serum cr up to 4,29 with K at 5,7 and serum bicarbonate at 19. No hydronephrosis per renal US. C3 117, C4 26. Urinary protein 115, with protein creatinine ration of 1,12. Elevated free light chains Kappa 27.9, Lambda 129,9 and Kappa/ lambda ratio 0.21. Case discussed with Dr. Posey Pronto from nephrology, will consult hematology, possible diagnostic bone marrow  biopsy. Continue sodium zirconium for hyperkalemia.   2. Anemia, thrombocytopenia and right upper extremity DVT, complicated with epistaxis and hemoptysis. Will hold for now on anticoagulation due to bleeding, will continue H&H monitoring. Patient is sp one unit prbc transfusion. Plt at 103 today. Noted elevated immature granulocytes. Iron panel with iron 64, TIBC 395, transferrin saturation 21, with ferritin 358.   3. Pneumonia with right para-pneumonic effusion. Patient sp thoracentesis, patient on cefepime IV.   4. Chronic CVA. Chronic right sided weakness. Patient very weak and deconditioned.   5. Scrotal cellulitis. Clinically improving.   6. Obesity. BMI at 32,1. Will need outpatient follow up.   DVT prophylaxis: IV heparin   Code Status: full Family Communication: no family at the bedside  Disposition Plan/ discharge barriers: pending improvement of renal function, may need HD on this admission.     Body mass index is 32.13 kg/m. Malnutrition Type:      Malnutrition Characteristics:      Nutrition Interventions:     RN Pressure Injury Documentation:     Consultants:   Nephrology   Procedures:     Antimicrobials:   Cefepime     Subjective: Patient continue to be very weak and deconditioned, denies any dyspnea, no nausea or vomiting, no chest pain. Limited mobility, continue to have edema.   Objective: Vitals:   01/19/19 2224 01/20/19 0234 01/20/19 0633 01/20/19 0700  BP: (!) 90/59 (!) 97/53 (!) 103/55   Pulse: 65 68 70   Resp: 19 19 20    Temp:  97.8 F (36.6 C) 97.7 F (36.5 C)   TempSrc:  Oral Oral   SpO2: 98% 97% 96%   Weight:    113.5 kg  Height:        Intake/Output Summary (Last 24 hours) at 01/20/2019 1217 Last data filed at 01/20/2019 1010 Gross per 24 hour  Intake 775.62 ml  Output 350 ml  Net 425.62 ml   Filed Weights   01/18/19 0600 01/19/19 0440 01/20/19 0700  Weight: 104.3 kg 111.9 kg 113.5 kg    Examination:   General:  deconditioned and ill looking appearing  Neurology: Awake and alert, non focal  E ENT: positive pallor, no icterus, oral mucosa moist Cardiovascular: No JVD. S1-S2 present, rhythmic, no gallops, rubs, or murmurs. ++ pitting bilateral lower extremity edema. Pulmonary:  positive breath sounds bilaterally, no wheezing, or rhonchi bilateral rales. Gastrointestinal. Abdomen obese with no organomegaly, non tender, no rebound or guarding Skin. No rashes. improved scrotal erythema.  Musculoskeletal: no joint deformities     Data Reviewed: I have personally reviewed following labs and imaging studies  CBC: Recent Labs  Lab 01/16/19 0956 01/17/19 0434 01/18/19 0459 01/19/19 0608 01/20/19 0133  WBC 7.3 8.7 9.6 10.4 12.2*  NEUTROABS 4.6 5.4 5.9 6.5 7.6  HGB 6.9* 7.4* 7.4* 7.5* 7.7*  HCT 22.2* 23.6* 23.0*   22.4* 23.5* 24.8*  MCV 106.7* 104.4* 104.5* 104.9* 106.9*  PLT 105* 101* 99* 102* 671*   Basic Metabolic Panel: Recent Labs  Lab 01/15/19 0134 01/16/19 0956 01/17/19 0434 01/18/19 0459 01/19/19 0608 01/19/19 1029 01/20/19 0133  NA 136 138 137 133*  --  133* 131*  K 5.1 4.3 4.6 5.2*  --  5.5* 5.7*  CL 106 107 106 103  --  104 102  CO2 21* 22 22 20*  --  18* 19*  GLUCOSE 95 145* 105* 106*  --  130* 102*  BUN 78* 82* 77* 90*  --  103* 109*  CREATININE 2.44* 2.61* 2.40* 3.12* 3.85* 3.98* 4.29*  CALCIUM 11.0* 11.1* 10.9* 10.5*  --  10.5* 10.5*  MG 2.8* 2.8*  --   --   --   --   --   PHOS 4.2  --   --   --   --   --  6.8*   GFR: Estimated Creatinine Clearance: 18.7 mL/min (A) (by C-G formula based on SCr of 4.29 mg/dL (H)). Liver Function Tests: Recent Labs  Lab 01/15/19 0134 01/16/19 0956 01/17/19 0434 01/18/19 0459 01/20/19 0133  AST 23 25 23 26   --   ALT 23 23 25 25   --   ALKPHOS 63 59 59 60  --   BILITOT 1.6* 1.2 0.9 1.0  --   PROT 6.4* 6.4* 6.2* 6.1*  --   ALBUMIN 4.1 3.9 3.7 3.7 3.7   No results for input(s): LIPASE, AMYLASE in the last 168 hours. No results  for input(s): AMMONIA in the last 168 hours. Coagulation Profile: No results for input(s): INR, PROTIME in the last 168 hours. Cardiac Enzymes: Recent Labs  Lab 01/15/19 0134 01/15/19 1242 01/16/19 0956 01/16/19 1538  TROPONINI 0.06* 0.05* 0.07* 0.06*   BNP (last 3 results) No results for input(s): PROBNP in the last 8760 hours. HbA1C: No results for input(s): HGBA1C in the last 72 hours. CBG: No results for input(s): GLUCAP in the last 168 hours. Lipid Profile: No results for input(s): CHOL, HDL, LDLCALC, TRIG, CHOLHDL, LDLDIRECT in the last 72 hours. Thyroid Function Tests: No results for input(s): TSH, T4TOTAL, FREET4, T3FREE, THYROIDAB in the last 72 hours. Anemia Panel: Recent  Labs    01/17/19 1700 01/18/19 0459  VITAMINB12  --  193  TIBC 305  --   IRON 64  --       Radiology Studies: I have reviewed all of the imaging during this hospital visit personally     Scheduled Meds:  aspirin  81 mg Oral Daily   atorvastatin  10 mg Oral Daily   cholecalciferol  5,000 Units Oral Daily   midodrine  5 mg Oral TID WC   nystatin   Topical TID   sodium zirconium cyclosilicate  10 g Oral BID   vitamin B-12  250 mcg Oral Daily   Continuous Infusions:  ceFEPime (MAXIPIME) IV 2 g (01/20/19 0607)     LOS: 6 days        Tavian Callander Gerome Apley, MD

## 2019-01-21 ENCOUNTER — Encounter (HOSPITAL_COMMUNITY): Payer: Self-pay | Admitting: Interventional Radiology

## 2019-01-21 ENCOUNTER — Inpatient Hospital Stay (HOSPITAL_COMMUNITY): Payer: Medicare Other

## 2019-01-21 ENCOUNTER — Encounter (HOSPITAL_COMMUNITY)
Admission: AD | Disposition: A | Discharge status: Rehabilitation Facility | Payer: Self-pay | Source: Other Acute Inpatient Hospital | Attending: Family Medicine

## 2019-01-21 DIAGNOSIS — K922 Gastrointestinal hemorrhage, unspecified: Secondary | ICD-10-CM

## 2019-01-21 DIAGNOSIS — R579 Shock, unspecified: Secondary | ICD-10-CM

## 2019-01-21 DIAGNOSIS — R188 Other ascites: Secondary | ICD-10-CM

## 2019-01-21 DIAGNOSIS — D62 Acute posthemorrhagic anemia: Secondary | ICD-10-CM

## 2019-01-21 DIAGNOSIS — K7460 Unspecified cirrhosis of liver: Secondary | ICD-10-CM

## 2019-01-21 HISTORY — PX: SCLEROTHERAPY: SHX6841

## 2019-01-21 HISTORY — PX: HEMOSTASIS CLIP PLACEMENT: SHX6857

## 2019-01-21 HISTORY — PX: HEMOSTASIS CONTROL: SHX6838

## 2019-01-21 HISTORY — PX: ESOPHAGOGASTRODUODENOSCOPY: SHX5428

## 2019-01-21 LAB — BLOOD GAS, ARTERIAL
Acid-base deficit: 3.9 mmol/L — ABNORMAL HIGH (ref 0.0–2.0)
Bicarbonate: 20.5 mmol/L (ref 20.0–28.0)
Drawn by: 560031
FIO2: 50
MECHVT: 650 mL
O2 Saturation: 99.4 %
PEEP: 5 cmH2O
Patient temperature: 37
RATE: 16 resp/min
pCO2 arterial: 36.4 mmHg (ref 32.0–48.0)
pH, Arterial: 7.368 (ref 7.350–7.450)
pO2, Arterial: 197 mmHg — ABNORMAL HIGH (ref 83.0–108.0)

## 2019-01-21 LAB — CBC WITH DIFFERENTIAL/PLATELET
Abs Immature Granulocytes: 1.11 10*3/uL — ABNORMAL HIGH (ref 0.00–0.07)
Abs Immature Granulocytes: 1.06 10*3/uL — ABNORMAL HIGH (ref 0.00–0.07)
Basophils Absolute: 0.1 10*3/uL (ref 0.0–0.1)
Basophils Absolute: 0.1 10*3/uL (ref 0.0–0.1)
Basophils Relative: 0 %
Basophils Relative: 1 %
Eosinophils Absolute: 0 10*3/uL (ref 0.0–0.5)
Eosinophils Absolute: 0 10*3/uL (ref 0.0–0.5)
Eosinophils Relative: 0 %
Eosinophils Relative: 0 %
HCT: 21.2 % — ABNORMAL LOW (ref 39.0–52.0)
HCT: 23.1 % — ABNORMAL LOW (ref 39.0–52.0)
Hemoglobin: 6.8 g/dL — CL (ref 13.0–17.0)
Hemoglobin: 7.7 g/dL — ABNORMAL LOW (ref 13.0–17.0)
Immature Granulocytes: 9 %
Immature Granulocytes: 8 %
Lymphocytes Relative: 16 %
Lymphocytes Relative: 19 %
Lymphs Abs: 1.9 10*3/uL (ref 0.7–4.0)
Lymphs Abs: 2.5 10*3/uL (ref 0.7–4.0)
MCH: 32.2 pg (ref 26.0–34.0)
MCH: 32.8 pg (ref 26.0–34.0)
MCHC: 32.1 g/dL (ref 30.0–36.0)
MCHC: 33.3 g/dL (ref 30.0–36.0)
MCV: 100.5 fL — ABNORMAL HIGH (ref 80.0–100.0)
MCV: 98.3 fL (ref 80.0–100.0)
Monocytes Absolute: 1.3 10*3/uL — ABNORMAL HIGH (ref 0.1–1.0)
Monocytes Absolute: 1.5 10*3/uL — ABNORMAL HIGH (ref 0.1–1.0)
Monocytes Relative: 11 %
Monocytes Relative: 11 %
Neutro Abs: 7.8 10*3/uL — ABNORMAL HIGH (ref 1.7–7.7)
Neutro Abs: 8 10*3/uL — ABNORMAL HIGH (ref 1.7–7.7)
Neutrophils Relative %: 64 %
Neutrophils Relative %: 61 %
Platelets: 89 10*3/uL — ABNORMAL LOW (ref 150–400)
Platelets: 94 10*3/uL — ABNORMAL LOW (ref 150–400)
RBC: 2.11 MIL/uL — ABNORMAL LOW (ref 4.22–5.81)
RBC: 2.35 MIL/uL — ABNORMAL LOW (ref 4.22–5.81)
RDW: 15.9 % — ABNORMAL HIGH (ref 11.5–15.5)
RDW: 16.7 % — ABNORMAL HIGH (ref 11.5–15.5)
WBC: 12.2 10*3/uL — ABNORMAL HIGH (ref 4.0–10.5)
WBC: 13.1 10*3/uL — ABNORMAL HIGH (ref 4.0–10.5)
nRBC: 1.6 % — ABNORMAL HIGH (ref 0.0–0.2)
nRBC: 3 % — ABNORMAL HIGH (ref 0.0–0.2)

## 2019-01-21 LAB — PROTIME-INR
INR: 1.3 — ABNORMAL HIGH (ref 0.8–1.2)
Prothrombin Time: 16.5 seconds — ABNORMAL HIGH (ref 11.4–15.2)

## 2019-01-21 LAB — BASIC METABOLIC PANEL
Anion gap: 12 (ref 5–15)
Anion gap: 9 (ref 5–15)
BUN: 102 mg/dL — ABNORMAL HIGH (ref 8–23)
BUN: 87 mg/dL — ABNORMAL HIGH (ref 8–23)
CO2: 20 mmol/L — ABNORMAL LOW (ref 22–32)
CO2: 21 mmol/L — ABNORMAL LOW (ref 22–32)
Calcium: 9.3 mg/dL (ref 8.9–10.3)
Calcium: 8.8 mg/dL — ABNORMAL LOW (ref 8.9–10.3)
Chloride: 104 mmol/L (ref 98–111)
Chloride: 107 mmol/L (ref 98–111)
Creatinine, Ser: 3.54 mg/dL — ABNORMAL HIGH (ref 0.61–1.24)
Creatinine, Ser: 2.88 mg/dL — ABNORMAL HIGH (ref 0.61–1.24)
GFR calc Af Amer: 18 mL/min — ABNORMAL LOW (ref >60)
GFR calc Af Amer: 23 mL/min — ABNORMAL LOW (ref >60)
GFR calc non Af Amer: 15 mL/min — ABNORMAL LOW (ref >60)
GFR calc non Af Amer: 20 mL/min — ABNORMAL LOW (ref >60)
Glucose, Bld: 107 mg/dL — ABNORMAL HIGH (ref 70–99)
Glucose, Bld: 104 mg/dL — ABNORMAL HIGH (ref 70–99)
Potassium: 5.5 mmol/L — ABNORMAL HIGH (ref 3.5–5.1)
Potassium: 4.9 mmol/L (ref 3.5–5.1)
Sodium: 136 mmol/L (ref 135–145)
Sodium: 137 mmol/L (ref 135–145)

## 2019-01-21 LAB — CULTURE, BODY FLUID W GRAM STAIN -BOTTLE: Culture: NO GROWTH

## 2019-01-21 LAB — C4 COMPLEMENT: Complement C4, Body Fluid: 28 mg/dL (ref 14–44)

## 2019-01-21 LAB — CULTURE, BLOOD (ROUTINE X 2)
Culture: NO GROWTH
Culture: NO GROWTH
Special Requests: ADEQUATE
Special Requests: ADEQUATE

## 2019-01-21 LAB — PREPARE RBC (CROSSMATCH)

## 2019-01-21 LAB — C3 COMPLEMENT: C3 Complement: 123 mg/dL (ref 82–167)

## 2019-01-21 LAB — GLUCOSE, CAPILLARY
Glucose-Capillary: 116 mg/dL — ABNORMAL HIGH (ref 70–99)
Glucose-Capillary: 117 mg/dL — ABNORMAL HIGH (ref 70–99)
Glucose-Capillary: 123 mg/dL — ABNORMAL HIGH (ref 70–99)

## 2019-01-21 LAB — PROCALCITONIN: Procalcitonin: 0.27 ng/mL

## 2019-01-21 LAB — AMMONIA: Ammonia: 69 umol/L — ABNORMAL HIGH (ref 9–35)

## 2019-01-21 SURGERY — EGD (ESOPHAGOGASTRODUODENOSCOPY)
Anesthesia: Moderate Sedation

## 2019-01-21 MED ORDER — MIDAZOLAM HCL 2 MG/2ML IJ SOLN
2.0000 mg | Freq: Once | INTRAMUSCULAR | Status: AC
  Administered 2019-01-21: 13:00:00 2 mg via INTRAVENOUS

## 2019-01-21 MED ORDER — ORAL CARE MOUTH RINSE
15.0000 mL | OROMUCOSAL | Status: DC
  Administered 2019-01-21 – 2019-01-22 (×14): 15 mL via OROMUCOSAL

## 2019-01-21 MED ORDER — FENTANYL CITRATE (PF) 100 MCG/2ML IJ SOLN
INTRAMUSCULAR | Status: AC
  Administered 2019-01-21: 10:00:00 100 ug via INTRAVENOUS
  Filled 2019-01-21: qty 2

## 2019-01-21 MED ORDER — MIDAZOLAM HCL 2 MG/2ML IJ SOLN
2.0000 mg | Freq: Once | INTRAMUSCULAR | Status: AC
  Administered 2019-01-21: 15:00:00 2 mg via INTRAVENOUS

## 2019-01-21 MED ORDER — MIDAZOLAM HCL 2 MG/2ML IJ SOLN
2.0000 mg | Freq: Once | INTRAMUSCULAR | Status: AC
  Administered 2019-01-21: 12:00:00 2 mg via INTRAVENOUS

## 2019-01-21 MED ORDER — MIDAZOLAM HCL 2 MG/2ML IJ SOLN
1.0000 mg | INTRAMUSCULAR | Status: DC | PRN
  Filled 2019-01-21 (×2): qty 2

## 2019-01-21 MED ORDER — ETOMIDATE 2 MG/ML IV SOLN
20.0000 mg | Freq: Once | INTRAVENOUS | Status: AC
  Administered 2019-01-21: 11:00:00 20 mg via INTRAVENOUS

## 2019-01-21 MED ORDER — SODIUM CHLORIDE 0.9 % IV SOLN
50.0000 ug/h | INTRAVENOUS | Status: DC
  Administered 2019-01-21 – 2019-01-22 (×3): 50 ug/h via INTRAVENOUS
  Filled 2019-01-21 (×5): qty 1

## 2019-01-21 MED ORDER — SODIUM CHLORIDE 0.9% IV SOLUTION
Freq: Once | INTRAVENOUS | Status: AC
  Administered 2019-01-21: 11:00:00 via INTRAVENOUS

## 2019-01-21 MED ORDER — MIDAZOLAM HCL 2 MG/2ML IJ SOLN
INTRAMUSCULAR | Status: AC
  Administered 2019-01-21: 10:00:00 2 mg via INTRAVENOUS
  Filled 2019-01-21: qty 2

## 2019-01-21 MED ORDER — NOREPINEPHRINE BITARTRATE 1 MG/ML IV SOLN
0.0000 ug/min | INTRAVENOUS | Status: DC
  Administered 2019-01-21: 20:00:00 30 ug/min via INTRAVENOUS
  Administered 2019-01-22: 22 ug/min via INTRAVENOUS
  Administered 2019-01-23: 15 ug/min via INTRAVENOUS
  Administered 2019-01-25: 2 ug/min via INTRAVENOUS
  Administered 2019-01-25: 20 ug/min via INTRAVENOUS
  Filled 2019-01-21 (×10): qty 16

## 2019-01-21 MED ORDER — PHENYLEPHRINE HCL-NACL 40-0.9 MG/250ML-% IV SOLN
0.0000 ug/min | INTRAVENOUS | Status: DC
  Administered 2019-01-21: 17:00:00 300 ug/min via INTRAVENOUS
  Administered 2019-01-21: 05:00:00 80 ug/min via INTRAVENOUS
  Administered 2019-01-21: 14:00:00 300 ug/min via INTRAVENOUS
  Filled 2019-01-21 (×4): qty 250

## 2019-01-21 MED ORDER — PHENYLEPHRINE HCL-NACL 10-0.9 MG/250ML-% IV SOLN
INTRAVENOUS | Status: AC
  Filled 2019-01-21: qty 250

## 2019-01-21 MED ORDER — CHLORHEXIDINE GLUCONATE 0.12 % MT SOLN
15.0000 mL | Freq: Two times a day (BID) | OROMUCOSAL | Status: DC
  Administered 2019-01-21: 10:00:00 15 mL via OROMUCOSAL

## 2019-01-21 MED ORDER — MIDAZOLAM HCL (PF) 5 MG/ML IJ SOLN
INTRAMUSCULAR | Status: AC
  Filled 2019-01-21: qty 2

## 2019-01-21 MED ORDER — FENTANYL CITRATE (PF) 100 MCG/2ML IJ SOLN: 25.0000 ug | INTRAMUSCULAR | Status: DC | PRN

## 2019-01-21 MED ORDER — MIDAZOLAM HCL 2 MG/2ML IJ SOLN: 1.0000 mg | INTRAMUSCULAR | Status: DC | PRN

## 2019-01-21 MED ORDER — MIDAZOLAM HCL 2 MG/2ML IJ SOLN
INTRAMUSCULAR | Status: AC
  Administered 2019-01-21: 15:00:00 2 mg via INTRAVENOUS
  Filled 2019-01-21: qty 2

## 2019-01-21 MED ORDER — MIDAZOLAM HCL 2 MG/2ML IJ SOLN
2.0000 mg | Freq: Once | INTRAMUSCULAR | Status: AC
  Administered 2019-01-21: 2 mg via INTRAVENOUS

## 2019-01-21 MED ORDER — ORAL CARE MOUTH RINSE
15.0000 mL | Freq: Two times a day (BID) | OROMUCOSAL | Status: DC
  Administered 2019-01-22 – 2019-02-10 (×26): 15 mL via OROMUCOSAL

## 2019-01-21 MED ORDER — CHLORHEXIDINE GLUCONATE 0.12% ORAL RINSE (MEDLINE KIT)
15.0000 mL | Freq: Two times a day (BID) | OROMUCOSAL | Status: DC
  Administered 2019-01-21 – 2019-02-10 (×22): 15 mL via OROMUCOSAL

## 2019-01-21 MED ORDER — METOCLOPRAMIDE HCL 5 MG/ML IJ SOLN
10.0000 mg | Freq: Once | INTRAMUSCULAR | Status: AC
  Administered 2019-01-21: 10:00:00 10 mg via INTRAVENOUS
  Filled 2019-01-21: qty 2

## 2019-01-21 MED ORDER — EPINEPHRINE 1 MG/10ML IJ SOSY
PREFILLED_SYRINGE | INTRAMUSCULAR | Status: AC
  Filled 2019-01-21: qty 20

## 2019-01-21 MED ORDER — ROCURONIUM BROMIDE 50 MG/5ML IV SOLN
30.0000 mg | Freq: Once | INTRAVENOUS | Status: AC
  Administered 2019-01-21: 11:00:00 30 mg via INTRAVENOUS
  Filled 2019-01-21: qty 3

## 2019-01-21 MED ORDER — NOREPINEPHRINE BITARTRATE 1 MG/ML IV SOLN
0.0000 ug/min | INTRAVENOUS | Status: DC
  Administered 2019-01-21: 11:00:00 2 ug/min via INTRAVENOUS
  Filled 2019-01-21 (×2): qty 4

## 2019-01-21 MED ORDER — FENTANYL CITRATE (PF) 100 MCG/2ML IJ SOLN
100.0000 ug | Freq: Once | INTRAMUSCULAR | Status: AC
  Administered 2019-01-21: 100 ug via INTRAVENOUS

## 2019-01-21 MED ORDER — FENTANYL CITRATE (PF) 100 MCG/2ML IJ SOLN
25.0000 ug | INTRAMUSCULAR | Status: DC | PRN
  Administered 2019-01-21 (×2): 100 ug via INTRAVENOUS
  Filled 2019-01-21 (×2): qty 2

## 2019-01-21 MED ORDER — SODIUM CHLORIDE (PF) 0.9 % IJ SOLN
PREFILLED_SYRINGE | INTRAMUSCULAR | Status: DC | PRN
  Administered 2019-01-21: 8 mL
  Administered 2019-01-21: 4.5 mL

## 2019-01-21 MED ORDER — OCTREOTIDE LOAD VIA INFUSION
50.0000 ug | Freq: Once | INTRAVENOUS | Status: AC
  Administered 2019-01-21: 14:00:00 50 ug via INTRAVENOUS
  Filled 2019-01-21: qty 25

## 2019-01-21 MED ORDER — FENTANYL CITRATE (PF) 100 MCG/2ML IJ SOLN
INTRAMUSCULAR | Status: AC
  Filled 2019-01-21: qty 2

## 2019-01-21 NOTE — Procedures (Signed)
Central Venous Catheter Insertion Procedure Note SANJAY BROADFOOT [id number] [birth year]  Procedure: Insertion of Central Venous Catheter Indications: Drug and/or fluid administration  Procedure Details Consent: Unable to obtain consent because of emergent medical necessity. Time Out: Verified patient identification, verified procedure, site/side was marked, verified correct patient position, special equipment/implants available, medications/allergies/relevent history reviewed, required imaging and test results available.  Performed  Maximum sterile technique was used including antiseptics, cap, gloves, gown, hand hygiene, mask and sheet. Skin prep: Chlorhexidine; local anesthetic administered A antimicrobial bonded/coated triple lumen catheter was placed in the left internal jugular vein using the Seldinger technique.  Evaluation Blood flow good Complications: No apparent complications Patient did tolerate procedure well. Chest X-ray ordered to verify placement.  CXR: pending.  Adewale A Olalere 01/21/2019, 3:39 PM

## 2019-01-21 NOTE — Progress Notes (Cosign Needed)
Wife had asked GI to call with update following EGD. No answer. I left brief VM explaining that we found source of bleeding and was able to treat it and that we would contact her for any recurrent bleeding.

## 2019-01-21 NOTE — Progress Notes (Addendum)
PROGRESS NOTE    Scott Mcdowell  OVZ:[id number] DOB: [birth year] DOA: 01/14/2019 PCP: [name], MD    Brief Narrative:  78 year old male who presented with worsening edema.  He has a significant past medical history of CVA.  Patient reported dyspnea for several weeks, no fever, chest pain or palpitations.  109/57, heart rate 91, temperature 98, oxygen saturation 100%.  Lungs have decreased breath sounds bilaterally, rales at the left base, heart S1-S2 present and rhythmic, abdomen soft, left lower extremity edema, positive scrotal edema.  Sodium 136, potassium 5.1, chloride 106, bicarb 21, glucose 95, BUN 78, creatinine 2.4, anion gap 9, troponin 0.06.  Chest radiograph with right pleural effusion and increased interstitial markings bilaterally.  EKG had 82 bpm, normal axis, sinus rhythm, right bundle branch block, ST depressions V3 to V6, with T wave inversions V1 to V4.   Patient was admitted to the hospital working diagnosis of right lower lobe pneumonia, complicated by a right parapneumonic pleural effusion and acute kidney injury.  Patient developed worsening renal function and required renal replacement therapy. Right upper extremity DVT, complicated with acute blood loss anemia due to GI bleeding, (suspected upper).    Assessment & Plan:   Principal Problem:   Shock ([place]) Active Problems:   ARF (acute renal failure) (HCC)   Hyperkalemia   B12 deficiency anemia   Pleural effusion on right   Ascites   CAP (community acquired pneumonia)   Elevated troponin   Acute blood loss anemia   Upper GI bleed  1. Oliguric AKI with hyperkalemia and non gap metabolic acidosis. Continue worsening renal function, started on renal replacement therapy last night, he had worsening volume overload with acute pulmonary edema. I spoke with Dr. Julien Nordmann from hematology, and requested immunoglobulin qualitative immunofixation, if monoclonal light chain elevation will need biopsy, likely bone  marrow. If polyclonal will not need biopsy and elevation can be attributed to renal failure. Patient now has a temporary dialysis catheter on the right internal jugular vein, placed by IR.   2. Hemorrhagic shock due to upper GI bleed with acute blood loss anemia. Patient is getting his 3rd unit of PRBC over last 24 H (4th since admission), then scheduled to get ffp. Hgb this am was down to 4.8. Will continue vasopressor therapy with phenylephrine per right IJ, keep a MAP 65 mmHg. Consulted GI this am. Continue NPO and pantoprazole drip. Patient has received ddavp and vitamin K, INR 1.6.   2. Anemia of chronic disease with thrombocytopenia and right upper extremity DVT. Hold on further anticoagulation due to hemorrhagic shock.   3. Pneumonia with right para-pneumonic effusion. Chest film with pulmonary edema, (pre-CRT). Patient has been on cefepime for the last 6 days. No signs of worsening pneumonia. Will dc antibiotic therapy for now.   4. Chronic CVA. Chronic right sided weakness. No asa due to bleeding.   5. Scrotal cellulitis. Resolved.    6. Obesity. BMI at 32,1.   DVT prophylaxis: scd  Code Status: full Family Communication: no family at the bedside  Disposition Plan/ discharge barriers: patient critically ill.  Patient critically ill on vasopressors and receiving blood products for acute blood loss anemia.    Critical care time 60 minutes.   Body mass index is 31.79 kg/m. Malnutrition Type:      Malnutrition Characteristics:      Nutrition Interventions:     RN Pressure Injury Documentation:     Consultants:   Nephrology   GI   Procedures:  Right IJ HD catheter/ central line   Antimicrobials:       Subjective: Patient not feeling well this am, he has been bleeding overnight, no nausea or vomiting, no chest pain. Dyspnea has improved but not yet back to baseline. Has NG tube in place.   Objective: Vitals:   01/21/19 0645 01/21/19 0700  01/21/19 0746 01/21/19 0747  BP: (!) 94/51 (!) 93/53 (!) 98/54 (!) 98/54  Pulse: 88 87 79 75  Resp: 20 16 15 14   Temp: 97.6 F (36.4 C)  97.6 F (36.4 C) 97.6 F (36.4 C)  TempSrc: Rectal  Oral Oral  SpO2: 100% 100% 100% 100%  Weight:      Height:        Intake/Output Summary (Last 24 hours) at 01/21/2019 0758 Last data filed at 01/21/2019 0753 Gross per 24 hour  Intake 2812.45 ml  Output 1563 ml  Net 1249.45 ml   Filed Weights   01/20/19 0700 01/20/19 1921 01/21/19 0500  Weight: 113.5 kg 111.6 kg 112.3 kg    Examination:   General: deconditioned and ill looking appearing  Neurology: Awake and alert, non focal  E ENT: positive pallor, no icterus, oral mucosa moist Cardiovascular: No JVD. S1-S2 present, rhythmic, no gallops, rubs, or murmurs. ++  pitting lower extremity edema. Pulmonary: positive breath sounds bilaterally, adequate air movement, no wheezing, rhonchi or rales. Gastrointestinal. Abdomen mild distented, no organomegaly, non tender, no rebound or guarding Skin. No rashes Musculoskeletal: no joint deformities     Data Reviewed: I have personally reviewed following labs and imaging studies  CBC: Recent Labs  Lab 01/17/19 0434 01/18/19 0459 01/19/19 0608 01/20/19 0133 01/20/19 2021 01/20/19 2330  WBC 8.7 9.6 10.4 12.2* 13.4*  --   NEUTROABS 5.4 5.9 6.5 7.6 8.0*  --   HGB 7.4* 7.4* 7.5* 7.7* 5.1* 4.8*  HCT 23.6* 23.0*  22.4* 23.5* 24.8* 16.4* 15.2*  MCV 104.4* 104.5* 104.9* 106.9* 105.8*  --   PLT 101* 99* 102* 103* 102*  --    Basic Metabolic Panel: Recent Labs  Lab 01/15/19 0134 01/16/19 0956 01/17/19 0434 01/18/19 0459 01/19/19 0608 01/19/19 1029 01/20/19 0133  NA 136 138 137 133*  --  133* 131*  K 5.1 4.3 4.6 5.2*  --  5.5* 5.7*  CL 106 107 106 103  --  104 102  CO2 21* 22 22 20*  --  18* 19*  GLUCOSE 95 145* 105* 106*  --  130* 102*  BUN 78* 82* 77* 90*  --  103* 109*  CREATININE 2.44* 2.61* 2.40* 3.12* 3.85* 3.98* 4.29*  CALCIUM  11.0* 11.1* 10.9* 10.5*  --  10.5* 10.5*  MG 2.8* 2.8*  --   --   --   --   --   PHOS 4.2  --   --   --   --   --  6.8*   GFR: Estimated Creatinine Clearance: 18.6 mL/min (A) (by C-G formula based on SCr of 4.29 mg/dL (H)). Liver Function Tests: Recent Labs  Lab 01/15/19 0134 01/16/19 0956 01/17/19 0434 01/18/19 0459 01/20/19 0133  AST 23 25 23 26   --   ALT 23 23 25 25   --   ALKPHOS 63 59 59 60  --   BILITOT 1.6* 1.2 0.9 1.0  --   PROT 6.4* 6.4* 6.2* 6.1*  --   ALBUMIN 4.1 3.9 3.7 3.7 3.7   No results for input(s): LIPASE, AMYLASE in the last 168 hours. Recent Labs  Lab 01/20/19  2330  AMMONIA 69*   Coagulation Profile: Recent Labs  Lab 01/20/19 2021  INR 1.6*   Cardiac Enzymes: Recent Labs  Lab 01/15/19 0134 01/15/19 1242 01/16/19 0956 01/16/19 1538  TROPONINI 0.06* 0.05* 0.07* 0.06*   BNP (last 3 results) No results for input(s): PROBNP in the last 8760 hours. HbA1C: No results for input(s): HGBA1C in the last 72 hours. CBG: Recent Labs  Lab 01/20/19 2346 01/21/19 0322  GLUCAP 123* 116*   Lipid Profile: No results for input(s): CHOL, HDL, LDLCALC, TRIG, CHOLHDL, LDLDIRECT in the last 72 hours. Thyroid Function Tests: No results for input(s): TSH, T4TOTAL, FREET4, T3FREE, THYROIDAB in the last 72 hours. Anemia Panel: No results for input(s): VITAMINB12, FOLATE, FERRITIN, TIBC, IRON, RETICCTPCT in the last 72 hours.    Radiology Studies: I have reviewed all of the imaging during this hospital visit personally     Scheduled Meds: . sodium chloride   Intravenous Once  . sodium chloride   Intravenous Once  . sodium chloride   Intravenous Once  . chlorhexidine  15 mL Mouth Rinse BID  . Chlorhexidine Gluconate Cloth  6 each Topical Daily  . cholecalciferol  5,000 Units Oral Daily  . mouth rinse  15 mL Mouth Rinse BID  . mouth rinse  15 mL Mouth Rinse q12n4p  . midodrine  5 mg Oral TID WC  . nystatin   Topical TID  . oxymetazoline  1 spray Each  Nare BID  . [START ON 01/24/2019] pantoprazole  40 mg Intravenous Q12H  . sodium zirconium cyclosilicate  10 g Oral BID  . vitamin B-12  250 mcg Oral Daily   Continuous Infusions: .  prismasol BGK 4/2.5 500 mL/hr at 01/21/19 0748  .  prismasol BGK 4/2.5 300 mL/hr at 01/20/19 2100  . ceFEPime (MAXIPIME) IV    . pantoprozole (PROTONIX) infusion 8 mg/hr (01/21/19 0732)  . phenylephrine (NEO-SYNEPHRINE) Adult infusion 120 mcg/min (01/21/19 0735)  . prismasol BGK 4/2.5 1,500 mL/hr at 01/21/19 0757     LOS: 7 days        Stephaie Dardis Gerome Apley, MD

## 2019-01-21 NOTE — Progress Notes (Signed)
called spouse to update her No answer

## 2019-01-21 NOTE — Op Note (Signed)
[facility] Patient Name: Scott Mcdowell Procedure Date: 01/21/2019 MRN: [id number] Attending MD: Carlota Raspberry. Callee Rohrig , MD Date of Birth: 11-Jun-1941 CSN: [id number] Age: 79 Admit Type: Inpatient Procedure:                Upper GI endoscopy Indications:              Severe upper GI bleeding - on heparin for upper                            extremity DVT, hemetemesis, hypotension on                            pressors, renal failure on CRRT, US imaging                            suggests cirrhosis, lytic lesions of the ribs noted                            on xray (? underlying myeloma?), coagulopathy,                            thrombocytopenia Providers:                Remo Lipps P. Havery Moros, MD, Burtis Junes, RN, Ladona Ridgel, Technician Referring MD:              Medicines:                sedation per ICU staff Complications:            No immediate complications. Estimated blood loss:                            moderate Estimated Blood Loss:     Estimated blood loss was moderate Procedure:                Pre-Anesthesia Assessment:                           - Prior to the procedure, a History and Physical                            was performed, and patient medications and                            allergies were reviewed. The patient's tolerance of                            previous anesthesia was also reviewed. The risks                            and benefits of the procedure and the sedation                            options and risks  were discussed with the patient.                            All questions were answered, and informed consent                            was obtained. Prior Anticoagulants: The patient has                            taken heparin, last dose was 1 day prior to                            procedure. ASA Grade Assessment: IV - A patient                            with severe systemic disease that is a  constant                            threat to life. After reviewing the risks and                            benefits, the patient was deemed in satisfactory                            condition to undergo the procedure.                           After obtaining informed consent, the endoscope was                            passed under direct vision. Throughout the                            procedure, the patient's blood pressure, pulse, and                            oxygen saturations were monitored continuously. The                            GIF-H190 ([id number]) Olympus gastroscope was                            introduced through the mouth, and advanced to the                            second part of duodenum. The upper GI endoscopy was                            accomplished without difficulty. The patient                            tolerated the procedure well. Scope In: Scope Out: Findings:      The examined esophagus was normal.  Red blood was found in the gastric antrum, following lavage no clear       pathology noted in the antrum, appeared to be refluxing out of the       duodenal bulb.      Adherent clot was found in the gastric body, and could not be washed       off. This could be trauma due to NG tube however one hemostatic clip was       successfully placed across the lesion and the area was successfully       injected with of a 1:10,000 solution of epinephrine for drug delivery.      The exam of the stomach was otherwise normal. Retroflexed views on       initial exam were normal. Following treatment in the distal stomach and       duodenum, repeat retroflexed views showed diffuse oozing from contact       hemorrhage from the scope. It was lavaged and no significant bleeding       occurred.      Red blood was found in the duodenal bulb and in the second portion of       the duodenum. Lavage showed 4 areas of discrete adherent clot with       underlying extremely  friable tissue, one of which had a visible vessel       with active bleeding, another area also with active oozing. These areas       were successfully injected with of a 1:10,000 solution of epinephrine       for hemostasis. Fulguration to stop the bleeding from the actively       bleeding vessel by Gold probe was successful. One hemostasis clip was       then placed across that lesion. Another area of friable tissue with       active bleeding was noted in the duodenal bulb. Lavage of the area did       not show obvious ulceration, but had overlying adherent clot and friable       tissue. For hemostasis, three hemostatic clips were successfully placed       across this area. An additional clip misfired during the procedure. 2       other areas of adherent clot could not be washed off but had no active       bleeding. The tissue in the duodenal bulb was extremely friable, due to       recent significant bleeding and difficulty providing hemostasis on this       exam, biopsies not taken. No obvious mass lesion. Unclear if the patient       has myeloma, if these lesions could represent implants in the bowel wall?      The exam of the duodenum was otherwise normal. Impression:               - Normal esophagus.                           - Adherent clot in the distal gastric body, unclear                            if NG tube trauma but treated with hemostasis clip  and epi. Friable mucosa in the proximal stomach                            with contact oozing.                           - Red blood with active bleeding and clots in the                            duodenal bulb. 4 discrete areas of adherent clot                            with extremely friable tissue but no focal                            ulceration appreciated. One of these areas had a                            visible vessel which was bleeding, another lesion                            had active  oozing. 2 areas treated as outlined                            above. 4 clips placed total in the duodenal bulb.                            Patient with lytic lesions noted on xray, unclear                            if he has underlying myeloma? These lesions appear                            to have high risk for relbeeding in the setting of                            coagulopathy. No active bleeding noted following                            endoscopic intervention. Moderate Sedation:      No moderate sedation, case performed with MAC Recommendation:           - Patient to remain in the ICU for ongoing care.                           - NPO.                           - Continue present medications including IV PPI                           - Serial Hgb                           -  Further workup pending course. If he survives                            these acute issues, may need repeat EGD to                            re-evaluate duodenal bulb following treatment with                            PPI and workup underlying liver disease                           - GI service will follow please call with questions Procedure Code(s):        --- Professional ---                           [phone], Esophagogastroduodenoscopy, flexible,                            transoral; with control of bleeding, any method                           [id number], 59, Esophagogastroduodenoscopy, flexible,                            transoral; with directed submucosal injection(s),                            any substance Diagnosis Code(s):        --- Professional ---                           K92.2, Gastrointestinal hemorrhage, unspecified CPT copyright 2019 [employer]. All rights reserved. The codes documented in this report are preliminary and upon coder review may  be revised to meet current compliance requirements. Remo Lipps P. Cannen Dupras, MD 01/21/2019 5:28:09 PM This report has been signed  electronically. Number of Addenda: 0

## 2019-01-21 NOTE — Progress Notes (Signed)
Patient ID: Scott Mcdowell, male   DOB: 15-Aug-1941, 78 y.o.   MRN: [id number]  [facility] Progress Note   Assessment/ Plan:   1. 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  Initially suspected to be hemodynamically mediated but arising data shows possibly intrarenal process-at this time suspected to be from plasma cell dyscrasia given elevated lambda light chains with low kappa/lambda ratio-hematology have directed additional work-up.  Currently on CRRT for clearance and efforts at volume unloading. 2.  Volume overload: Failed trial of diuretics and currently on pressors with efforts at ultrafiltration for volume unloading. 3.  Hyperkalemia: Status post Lokelma, now on CRRT. 4.  Anemia/thrombocytopenia: Mildly depressed iron studies, status post intravenous iron and ongoing evaluation for possible plasma cell dyscrasia. 5.  Right arm brachial vein DVT: Previously on heparin drip that was discontinued after he developed GI bleed. 6.  Hypotension: Currently on pressor support, wean as tolerated. 7.  Vitamin D deficiency: Continue cholecalciferol supplementation when stabilized  Subjective:   Emergently transferred to ICU yesterday with hemodynamic instability/GI bleed and thereafter intubated for airway protection.  Started on CRRT overnight.   Objective:   BP 139/84   Pulse 81   Temp 98.6 F (37 C) (Axillary)   Resp 17   Ht 6' 2" (1.88 m)   Wt 112.3 kg   SpO2 100%   BMI 31.79 kg/m   Intake/Output Summary (Last 24 hours) at 01/21/2019 1258 Last data filed at 01/21/2019 1000 Gross per 24 hour  Intake 3373.47 ml  Output 2139 ml  Net 1234.47 ml   Weight change: -1.9 kg  Physical Exam: Gen: Intubated, sedated, blood noted in ET tube apparatus, on CRRT via right IJ dialysis catheter CVS: Pulse regular rhythm, normal rate, S1-S2 normal Resp: Coarse/ventilator associated breath sounds bilaterally Abd: Soft, obese,  nontender Ext: Trace-1+ lower extremity edema, 2+ right upper extremity edema, 1+ left upper extremity edema  Imaging: Dg Abd 1 View  Result Date: 01/20/2019 CLINICAL DATA:  NG tube positioning EXAM: ABDOMEN - 1 VIEW COMPARISON:  None. FINDINGS: The NG tube tip projects over the gastric antrum. The tip is pointed distally. The bowel gas pattern is nonspecific and nonobstructive. IMPRESSION: NG tube tip projects over the gastric antrum. The tip is pointed distally. Electronically Signed   By: Constance Holster M.D.   On: 01/20/2019 22:37   Ir Fluoro Guide Cv Line Right  Result Date: 01/20/2019 INDICATION: Renal insufficiency. In need of intravenous access for the initiation of dialysis. EXAM: NON-TUNNELED CENTRAL VENOUS HEMODIALYSIS CATHETER PLACEMENT WITH ULTRASOUND AND FLUOROSCOPIC GUIDANCE COMPARISON:  Chest CT-01/14/2019 MEDICATIONS: None FLUOROSCOPY TIME:  24 seconds (10 mGy) COMPLICATIONS: None immediate. PROCEDURE: Informed written consent was obtained from the patient after a discussion of the risks, benefits, and alternatives to treatment. Questions regarding the procedure were encouraged and answered. The right neck and chest were prepped with chlorhexidine in a sterile fashion, and a sterile drape was applied covering the operative field. Maximum barrier sterile technique with sterile gowns and gloves were used for the procedure. A timeout was performed prior to the initiation of the procedure. After the overlying soft tissues were anesthetized, a small venotomy incision was created and a micropuncture kit was utilized to access the internal jugular vein. Real-time ultrasound guidance was utilized for vascular access including the acquisition of a permanent ultrasound image documenting patency of the accessed vessel. The microwire was utilized to measure appropriate catheter length. A stiff glidewire was advanced  to the level of the IVC. Under fluoroscopic guidance, the venotomy was serially  dilated, ultimately allowing placement of a 20 cm temporary Trialysis catheter with tip ultimately terminating within the superior aspect of the right atrium. Final catheter positioning was confirmed and documented with a spot radiographic image. The catheter aspirates and flushes normally. The catheter was flushed with appropriate volume heparin dwells. The catheter exit site was secured with a 0-Prolene retention suture. A dressing was placed. The patient tolerated the procedure well without immediate post procedural complication. IMPRESSION: Successful placement of a right internal jugular approach 20 cm temporary dialysis catheter with tip terminating with in the superior aspect of the right atrium. The catheter is ready for immediate use. PLAN: This catheter may be converted to a tunneled dialysis catheter at a later date as indicated. Electronically Signed   By: Sandi Mariscal M.D.   On: 01/20/2019 17:24   Ir US Guide Vasc Access Right  Result Date: 01/21/2019 INDICATION: Renal insufficiency. In need of intravenous access for the initiation of dialysis. EXAM: NON-TUNNELED CENTRAL VENOUS HEMODIALYSIS CATHETER PLACEMENT WITH ULTRASOUND AND FLUOROSCOPIC GUIDANCE COMPARISON:  Chest CT-01/14/2019 MEDICATIONS: None FLUOROSCOPY TIME:  24 seconds (10 mGy) COMPLICATIONS: None immediate. PROCEDURE: Informed written consent was obtained from the patient after a discussion of the risks, benefits, and alternatives to treatment. Questions regarding the procedure were encouraged and answered. The right neck and chest were prepped with chlorhexidine in a sterile fashion, and a sterile drape was applied covering the operative field. Maximum barrier sterile technique with sterile gowns and gloves were used for the procedure. A timeout was performed prior to the initiation of the procedure. After the overlying soft tissues were anesthetized, a small venotomy incision was created and a micropuncture kit was utilized to access the  internal jugular vein. Real-time ultrasound guidance was utilized for vascular access including the acquisition of a permanent ultrasound image documenting patency of the accessed vessel. The microwire was utilized to measure appropriate catheter length. A stiff glidewire was advanced to the level of the IVC. Under fluoroscopic guidance, the venotomy was serially dilated, ultimately allowing placement of a 20 cm temporary Trialysis catheter with tip ultimately terminating within the superior aspect of the right atrium. Final catheter positioning was confirmed and documented with a spot radiographic image. The catheter aspirates and flushes normally. The catheter was flushed with appropriate volume heparin dwells. The catheter exit site was secured with a 0-Prolene retention suture. A dressing was placed. The patient tolerated the procedure well without immediate post procedural complication. IMPRESSION: Successful placement of a right internal jugular approach 20 cm temporary dialysis catheter with tip terminating with in the superior aspect of the right atrium. The catheter is ready for immediate use. PLAN: This catheter may be converted to a tunneled dialysis catheter at a later date as indicated. Electronically Signed   By: Sandi Mariscal M.D.   On: 01/20/2019 17:24   Portable Chest X-ray  Result Date: 01/21/2019 CLINICAL DATA:  Endotracheal tube EXAM: PORTABLE CHEST 1 VIEW COMPARISON:  Yesterday FINDINGS: Endotracheal tube tip between the clavicular heads and carina. Orogastric tube reaches the stomach. Dialysis catheter with tip at the upper cavoatrial junction. Cardiomegaly, stable. Haziness of the right more than left lower chest attributed atelectasis and pleural fluid. Right lateral fifth rib lucent lesion that appears aggressive on prior imaging. These results were called by telephone at the time of interpretation on 01/21/2019 at 11:05 am to Dr. Noe Gens , who verbally acknowledged these results.  IMPRESSION:  1. New endotracheal and orogastric tubes in expected position. Mildly improved lung volumes. 2. Lytic lesion in the lateral right fifth rib needing workup for malignancy Electronically Signed   By: Monte Fantasia M.D.   On: 01/21/2019 11:06   Dg Chest Port 1 View  Result Date: 01/20/2019 CLINICAL DATA:  Central line placement EXAM: PORTABLE CHEST 1 VIEW COMPARISON:  01/16/2019 FINDINGS: There is a well-positioned non tunneled right-sided central venous catheter with tip projecting over the right atrium. The cardiac silhouette is enlarged. There is no pneumothorax. There is a moderate sized right-sided pleural effusion. There is a small left-sided pleural effusion. There is generalized volume overload with evidence of pulmonary edema. IMPRESSION: 1. Well-positioned right-sided dialysis catheter.  No pneumothorax. 2. Cardiomegaly with volume overload including small to moderate-sized bilateral pleural effusions. Electronically Signed   By: Constance Holster M.D.   On: 01/20/2019 20:15   Vas [place] Upper Extremity Venous Duplex  Result Date: 01/19/2019 UPPER VENOUS STUDY  Indications: Swelling Performing Technologist: Maudry Mayhew MHA, RDMS, RVT, RDCS  Examination Guidelines: A complete evaluation includes B-mode imaging, spectral Doppler, color Doppler, and power Doppler as needed of all accessible portions of each vessel. Bilateral testing is considered an integral part of a complete examination. Limited examinations for reoccurring indications may be performed as noted.  Right Findings: +----------+------------+---------+-----------+----------+-------+ RIGHT     CompressiblePhasicitySpontaneousPropertiesSummary +----------+------------+---------+-----------+----------+-------+ IJV           Full       Yes       Yes                      +----------+------------+---------+-----------+----------+-------+ Subclavian    Full       Yes       Yes                       +----------+------------+---------+-----------+----------+-------+ Axillary      Full       Yes       Yes                      +----------+------------+---------+-----------+----------+-------+ Brachial      None                 No                Acute  +----------+------------+---------+-----------+----------+-------+ Radial        Full                                          +----------+------------+---------+-----------+----------+-------+ Ulnar         Full                                          +----------+------------+---------+-----------+----------+-------+ Cephalic      Full                                          +----------+------------+---------+-----------+----------+-------+ Basilic       Full                                          +----------+------------+---------+-----------+----------+-------+  Left Findings: +----------+------------+---------+-----------+----------+-------+ LEFT      CompressiblePhasicitySpontaneousPropertiesSummary +----------+------------+---------+-----------+----------+-------+ Subclavian               Yes       Yes                      +----------+------------+---------+-----------+----------+-------+  Summary:  Right: No evidence of superficial vein thrombosis in the upper extremity. Findings consistent with acute deep vein thrombosis involving the right brachial veins.  Left: No evidence of thrombosis in the subclavian.  *See table(s) above for measurements and observations.  Diagnosing physician: Deitra Mayo MD Electronically signed by Deitra Mayo MD on 01/19/2019 at 4:52:34 PM.    Final     Labs: BMET Recent Labs  Lab 01/15/19 0134 01/16/19 5374 01/17/19 8270 01/18/19 0459 01/19/19 7867 01/19/19 1029 01/20/19 0133 01/21/19 0959  NA 136 138 137 133*  --  133* 131* 136  K 5.1 4.3 4.6 5.2*  --  5.5* 5.7* 5.5*  CL 106 107 106 103  --  104 102 104  CO2 21* 22 22 20*  --  18* 19* 20*   GLUCOSE 95 145* 105* 106*  --  130* 102* 107*  BUN 78* 82* 77* 90*  --  103* 109* 102*  CREATININE 2.44* 2.61* 2.40* 3.12* 3.85* 3.98* 4.29* 3.54*  CALCIUM 11.0* 11.1* 10.9* 10.5*  --  10.5* 10.5* 9.3  PHOS 4.2  --   --   --   --   --  6.8*  --    CBC Recent Labs  Lab 01/19/19 0608 01/20/19 0133 01/20/19 2021 01/20/19 2330 01/21/19 0959  WBC 10.4 12.2* 13.4*  --  12.2*  NEUTROABS 6.5 7.6 8.0*  --  7.8*  HGB 7.5* 7.7* 5.1* 4.8* 6.8*  HCT 23.5* 24.8* 16.4* 15.2* 21.2*  MCV 104.9* 106.9* 105.8*  --  100.5*  PLT 102* 103* 102*  --  89*   Medications:    . sodium chloride   Intravenous Once  . chlorhexidine  15 mL Mouth Rinse BID  . chlorhexidine gluconate (MEDLINE KIT)  15 mL Mouth Rinse BID  . Chlorhexidine Gluconate Cloth  6 each Topical Daily  . cholecalciferol  5,000 Units Oral Daily  . mouth rinse  15 mL Mouth Rinse q12n4p  . mouth rinse  15 mL Mouth Rinse 10 times per day  . midodrine  5 mg Oral TID WC  . nystatin   Topical TID  . octreotide  50 mcg Intravenous Once  . [START ON 01/24/2019] pantoprazole  40 mg Intravenous Q12H  . vitamin B-12  250 mcg Oral Daily   Elmarie Shiley, MD 01/21/2019, 12:58 PM

## 2019-01-21 NOTE — Progress Notes (Signed)
NAME:  Scott Mcdowell, MRN:  [id number], DOB:  05-06-1941, LOS: 7 ADMISSION DATE:  01/14/2019, CONSULTATION DATE:  6.3.2020 REFERRING MD:  Hospitalist, [facility] CHIEF COMPLAINT: Hypotension with maroon stools  Brief History   78 y/o M, with CKD, admitted 5/28 with reports of SOB in the setting of suspected PNA and SOB.  While on the floor, he developed melena.  He was on heparin for RUE DVT. The patient was transferred to ICU 6/3 with hypotension.    PMH  CKD - was not HD dependent prior to this admit Dyslipidemia  Questionable HTN  Consults:  Nephrology IR Critical Care GI  Procedures:  Tunneled HD 5/30 >> ETT 6/4 >>  Significant Diagnostic Tests:  ECHO 5/29 >> LVEF 60-65%, RA moderately dilated Thoracentesis 5/30 >> UE Venous Duplex 6/2 >> positive for acute R brachial vein DVT  Cytology R Pleural Fluid 5/30 >> reactive cells, no evidence of malignancy   Micro Data:  U. Strep 5/29 >> negative  COVID 5/29 >> negative RVP 5/30 >> negative Pleural Fluid 5/30 >> negative   BCx2 5/29 >> negative  BCx2 5/30 >> negative   Antimicrobials:  Azithro 5/29 >> 5/31  Vanco 5/29 >> 5/30 Cefepime 5/30 >>  Objective   Blood pressure (!) 110/54, pulse 84, temperature 97.6 F (36.4 C), temperature source Oral, resp. rate 16, height '6\' 2"'  (1.88 m), weight 112.3 kg, SpO2 100 %. CVP:  [17 mmHg-19 mmHg] 19 mmHg      Intake/Output Summary (Last 24 hours) at 01/21/2019 0903 Last data filed at 01/21/2019 0900 Gross per 24 hour  Intake 3218.31 ml  Output 1996 ml  Net 1222.31 ml   Filed Weights   01/20/19 0700 01/20/19 1921 01/21/19 0500  Weight: 113.5 kg 111.6 kg 112.3 kg    Examination: General: elderly male lying in bed in NAD HEENT: MM pink/dry, poor dentition, R IJ perm cath Neuro: Awake, alert, oriented CV: s1s2 rrr, ?2/6 murmur noted  PULM: even/non-labored, lungs bilaterally clear OQ:HUTM, non-tender, bsx4 active  Extremities: warm/dry, no edema  Skin: no rashes or  lesions   Assessment & Plan:   Shock in setting of ABLA secondary to GIB & Anticoagulation Administration -elevated INR, ammonia, cirrhotic changes on CT raises question of variceal bleeding P: Volume resuscitation with PRBC's Hold anticoagulation, SCD's only  GI Consulted, appreciate assistance with patient care  Pending intubation for endoscopic evaluation for airway protection  Continue PPI gtt, octreotide   ABLA Acquired Coagulopathy  RUE DVT  Thrombocytopenia  -lab review shows anemia from admit P: Trend CBC, INR Hold heparin  ? If he will be able to go back on anticoagulation Monitor for further bleeding   CKD  -was not on HD prior to admit Electrolyte Disturbances - Hypokalemia, Hypokalemia, Hypercalcemia P: Appreciate Nephrology Trend BMP / urinary output Replace electrolytes as indicated Avoid nephrotoxic agents, ensure adequate renal perfusion  Bilateral R>L Pleural Effusions  -suspect in setting of renal failure P: Follow CXR intermittently  Volume removal with HD Pulmonary hygiene as able   Mechanical Ventilation for Airway Protection P:  PRVC 8cc/kg, rate 16 Wean PEEP / FiO2 to support sats >90% ABG in one hour post intubation  CXR post intubation and in am  SBT in am with plan for extubation assuming GI does not need further procedural interventions PAD protocol for RASS of 0 to -1 with intermittent sedation   Abnormal Rib Findings on XRAY, Scapular Lesions on CT P: Reviewed with Radiology, concern for possible multiple myeloma  Send SPEP, UPEP  Rule Out SBP P: No WBC / fever to suggest (doubt active issue) Empiric antibiotic coverage for now, consider narrowing or stopping 6/5.  D7/x abx  Hx CVA -baseline wheelchair dependent P: PT efforts when able   Best practice:  Diet: NPO Pain/Anxiety/Delirium protocol (if indicated): PRN fentanyl / versed DVT prophylaxis: SCD ordered GI prophylaxis: PPI + Octreotide Glucose control: n/a  Mobility: As tolerated  Code Status: Full Code Family Communication: Wife and Daughter updated via phone on plan of care 6/4. Confirmed full code.  [name]'s number is [phone], Mary's phone [phone] Disposition: ICU  Addendum - 1110.  Family called back for update on CXR findings, concern for multiple myeloma.   Labs   CBC: Recent Labs  Lab 01/17/19 0434 01/18/19 0459 01/19/19 1103 01/20/19 0133 01/20/19 2021 01/20/19 2330  WBC 8.7 9.6 10.4 12.2* 13.4*  --   NEUTROABS 5.4 5.9 6.5 7.6 8.0*  --   HGB 7.4* 7.4* 7.5* 7.7* 5.1* 4.8*  HCT 23.6* 23.0*  22.4* 23.5* 24.8* 16.4* 15.2*  MCV 104.4* 104.5* 104.9* 106.9* 105.8*  --   PLT 101* 99* 102* 103* 102*  --     Basic Metabolic Panel: Recent Labs  Lab 01/15/19 0134 01/16/19 0956 01/17/19 0434 01/18/19 0459 01/19/19 0608 01/19/19 1029 01/20/19 0133  NA 136 138 137 133*  --  133* 131*  K 5.1 4.3 4.6 5.2*  --  5.5* 5.7*  CL 106 107 106 103  --  104 102  CO2 21* 22 22 20*  --  18* 19*  GLUCOSE 95 145* 105* 106*  --  130* 102*  BUN 78* 82* 77* 90*  --  103* 109*  CREATININE 2.44* 2.61* 2.40* 3.12* 3.85* 3.98* 4.29*  CALCIUM 11.0* 11.1* 10.9* 10.5*  --  10.5* 10.5*  MG 2.8* 2.8*  --   --   --   --   --   PHOS 4.2  --   --   --   --   --  6.8*   GFR: Estimated Creatinine Clearance: 18.6 mL/min (A) (by C-G formula based on SCr of 4.29 mg/dL (H)). Recent Labs  Lab 01/18/19 0459 01/19/19 0608 01/20/19 0133 01/20/19 2021 01/20/19 2330  PROCALCITON  --   --   --   --  0.27  WBC 9.6 10.4 12.2* 13.4*  --     Liver Function Tests: Recent Labs  Lab 01/15/19 0134 01/16/19 0956 01/17/19 0434 01/18/19 0459 01/20/19 0133  AST '23 25 23 26  ' --   ALT '23 23 25 25  ' --   ALKPHOS 63 59 59 60  --   BILITOT 1.6* 1.2 0.9 1.0  --   PROT 6.4* 6.4* 6.2* 6.1*  --   ALBUMIN 4.1 3.9 3.7 3.7 3.7   No results for input(s): LIPASE, AMYLASE in the last 168 hours. Recent Labs  Lab 01/20/19 2330  AMMONIA 69*    ABG No  results found for: PHART, PCO2ART, PO2ART, HCO3, TCO2, ACIDBASEDEF, O2SAT   Coagulation Profile: Recent Labs  Lab 01/20/19 2021  INR 1.6*    Cardiac Enzymes: Recent Labs  Lab 01/15/19 0134 01/15/19 1242 01/16/19 0956 01/16/19 1538  TROPONINI 0.06* 0.05* 0.07* 0.06*    HbA1C: No results found for: HGBA1C  CBG: Recent Labs  Lab 01/20/19 2346 01/21/19 0322  GLUCAP 123* 116*     Critical care time: 47 minutes    Noe Gens, NP-C [facility] Pgr: [phone] or if no answer  [phone] 01/21/2019, 9:03 AM

## 2019-01-21 NOTE — Consult Note (Signed)
Referring Provider:   [facility]        Primary Care Physician:  [name], MD Primary Gastroenterologist:  unassigned           Reason for Consultation:   GI bleed         ASSESSMENT / PLAN:    110.  78 year old male with acute upper GI bleed on heparin for upper extremity DVT.  Patient on pressors progressive hypotension . Maroon-colored stools during the night, none this a.m.  He does have some dark coffee-ground material in NG tube canister. Rule out PUD. U/S earlier this admission revealed ascites and ? Cirrhosis so from portal gastropathy and/or esophageal varices is of concern -Patient needs EGD at bedside.  I spoke with PCCM regarding pulmonary status.  The patient will be intubated for the bedside EGD. I spoke with wife [name] about the risks/ benefits of the EGD and she is agreeable to proceeding. The patient is also comfortable proceeding with the procedure.  -continue with PPI gtt.  -I have already ordered repeat stat serum K+ , H+H, and INR   2. ? Cirrhosis.  He is coagulopathic with low platelet count. Will workup cirrhosis after resolution of acute issues.  -He received a few doses of Maxipime earlier this admission.  About to proceed with upper endoscopy.  If there is portal hypertension suggestive of cirrhosis then he will antibiotics for SBP prophylaxis -I have started Octreotide empirically.   3. Macrocytic anemia / ABL anemia.  He presented with a hemoglobin of 7.3 on [birth year]14/1931.  After a unit of blood he had an appropriate rise in hemoglobin but with last night's GI bleed hemoglobin fell from 7.7-4.8.  He has received IV iron this admission. -Patient is status post 3 more units of blood this a.m.  Hemoglobin rose less than 2 units from 5.1-6.8.  Ordering another unit of blood now. -B12 repletion already in process                                                                HPI:   Scott Mcdowell is a 78 y.o. male history of CVA and right-sided  weakness admitted 01/15/2019 with shortness of breath, scrotal edema. He was found to have AKI / ? CKD, volume overload, elevated troponin, macrocytic anemia with hgb of 7.3., and CAP with right pleural effusion.   Patient was admitted for further work-up of above problems. For anemia, he received a unit of blood on 01/16/2019 after hgb declined further to 6.9. Following the unit of blood Hgb remained stable in mid 7 range. He is macrocytic with a vitamin B12 deficiency which is being repleted with improvement in Vit B12 from 120 to 193.  He has received an iron infusion this admission.  Elevated troponin felt to be secondary to AKI. For volume overload he failed diuresis, renal function continued to worsen and he is now getting CRRT.    Patient on antibiotics for CAP. He had a right thoracentesis with fluid studies suggesing a transudative process.   This admission patient found to have a RUE DVT, started on heparin.  Overnight patient he developed respiratory distress and worsening hypotension.  Nursing staff reported 3 large bloody bowel movements.  Hemoglobin declined from 7.7 yesterday to 4.8. He  was transferred to ICU, started on pressor. Heparin held.  3 units of packed red blood cells were ordered, third 1 infusion now.  He was given FFP, vitamin K and DDAVP  This a.m. patient has had any further bleeding per nursing staff.  Post transfusion H&H pending.  Patient denies abdominal pain.  He denies history of PUD.  He denies NSAID use at home but daily baby ASA on home med list. Denies hx of GERD. No GI meds on home med list.  Patient does seem to have some difficulty expressing thoughts.    Past Medical History:  Diagnosis Date  . Stroke [facility])    w right sided weakness    Past Surgical History:  Procedure Laterality Date  . IR FLUORO GUIDE CV LINE RIGHT  01/20/2019  . IR US GUIDE VASC ACCESS RIGHT  01/20/2019    Prior to Admission medications   Medication Sig Start Date End Date Taking?  Authorizing Provider  aspirin 81 MG chewable tablet Chew 81 mg by mouth daily.   Yes [provider]  atorvastatin (LIPITOR) 10 MG tablet Take 10 mg by mouth daily. 11/12/18  Yes [provider]  ramipril (ALTACE) 2.5 MG capsule Take 2.5 mg by mouth daily. 11/12/18  Yes [provider]    Current Facility-Administered Medications  Medication Dose Route Frequency Provider Last Rate Last Dose  .  prismasol BGK 4/2.5 infusion   CRRT Continuous Dwana Melena, MD 500 mL/hr at 01/21/19 [phone]    .  prismasol BGK 4/2.5 infusion   CRRT Continuous Dwana Melena, MD 300 mL/hr at 01/20/19 2100    . chlorhexidine (PERIDEX) 0.12 % solution 15 mL  15 mL Mouth Rinse BID Arrien, Jimmy Picket, MD      . Chlorhexidine Gluconate Cloth 2 % PADS 6 each  6 each Topical Daily Arrien, Jimmy Picket, MD   6 each at 01/20/19 2039  . cholecalciferol (VITAMIN D3) tablet 5,000 Units  5,000 Units Oral Daily Elmarie Shiley, MD   5,000 Units at 01/20/19 [phone]  . lidocaine (XYLOCAINE) 1 % (with pres) injection    PRN Sandi Mariscal, MD   10 mL at 01/20/19 1656  . MEDLINE mouth rinse  15 mL Mouth Rinse q12n4p Arrien, Jimmy Picket, MD      . midodrine (PROAMATINE) tablet 5 mg  5 mg Oral TID WC Elmarie Shiley, MD   5 mg at 01/20/19 1323  . nystatin (MYCOSTATIN/NYSTOP) topical powder   Topical TID Darliss Cheney, MD      . pantoprazole (PROTONIX) 80 mg in sodium chloride 0.9 % 250 mL (0.32 mg/mL) infusion  8 mg/hr Intravenous Continuous Anders Simmonds, MD 25 mL/hr at 01/21/19 0900 8 mg/hr at 01/21/19 0900  . [START ON 01/24/2019] pantoprazole (PROTONIX) injection 40 mg  40 mg Intravenous Q12H Anders Simmonds, MD      . phenylephrine (NEOSYNEPHRINE) 40-0.9 MG/250ML-% infusion  0-400 mcg/min Intravenous Titrated Tawni Millers, MD 45 mL/hr at 01/21/19 0900 120 mcg/min at 01/21/19 0900  . prismasol BGK 4/2.5 infusion   CRRT Continuous Dwana Melena, MD 1,500 mL/hr at 01/21/19 0757    . sodium chloride (OCEAN)  0.65 % nasal spray 1 spray  1 spray Each Nare PRN Darliss Cheney, MD   1 spray at 01/20/19 0957  . sodium chloride 0.9 % primer fluid for CRRT   CRRT PRN Dwana Melena, MD      . sodium chloride flush (NS) 0.9 % injection 10-40 mL  10-40 mL Intracatheter PRN Arrien, Jimmy Picket, MD      . sodium zirconium cyclosilicate [facility]) packet 10 g  10 g Oral BID Elmarie Shiley, MD   10 g at 01/20/19 1505  . vitamin B-12 (CYANOCOBALAMIN) tablet 250 mcg  250 mcg Oral Daily Darliss Cheney, MD   250 mcg at 01/20/19 6294    Allergies as of 01/14/2019  . (No Known Allergies)    Family History  Problem Relation Age of Onset  . Cancer Mother   . Parkinson's disease Father     Social History   Socioeconomic History  . Marital status: Married    Spouse name: Not on file  . Number of children: Not on file  . Years of education: Not on file  . Highest education level: Not on file  Occupational History  . Not on file  Social Needs  . Financial resource strain: Not on file  . Food insecurity:    Worry: Not on file    Inability: Not on file  . Transportation needs:    Medical: Not on file    Non-medical: Not on file  Tobacco Use  . Smoking status: Former Smoker    Types: Cigarettes  . Smokeless tobacco: Never Used  Substance and Sexual Activity  . Alcohol use: Yes    Alcohol/week: 5.0 standard drinks    Types: 5 Cans of beer per week  . Drug use: Not on file  . Sexual activity: Not on file  Lifestyle  . Physical activity:    Days per week: Not on file    Minutes per session: Not on file  . Stress: Not on file  Relationships  . Social connections:    Talks on phone: Not on file    Gets together: Not on file    Attends religious service: Not on file    Active member of club or organization: Not on file    Attends meetings of clubs or organizations: Not on file    Relationship status: Not on file  . Intimate partner violence:    Fear of current or ex partner: Not on file     Emotionally abused: Not on file    Physically abused: Not on file    Forced sexual activity: Not on file  Other Topics Concern  . Not on file  Social History Narrative  . Not on file    Review of Systems: All systems reviewed and negative except where noted in HPI.  Physical Exam: Vital signs in last 24 hours: Temp:  [95.1 F (35.1 C)-98.5 F (36.9 C)] 97.6 F (36.4 C) (06/04 0930) Pulse Rate:  [75-90] 80 (06/04 0930) Resp:  [12-29] 19 (06/04 0930) BP: (84-157)/(40-123) 97/57 (06/04 0930) SpO2:  [92 %-100 %] 100 % (06/04 0930) Weight:  [111.6 kg-112.3 kg] 112.3 kg (06/04 0500) Last BM Date: 01/21/19 General:   Alert, well-developed, male in NAD. On warming blanket Psych:  Pleasant, cooperative.  Eyes:  Pupils equal, sclera clear, no icterus.   Conjunctiva pink. Ears:  Normal auditory acuity. Nose:  No deformity, discharge,  or lesions. NGT >> dark liquid in cannister ( approx 200 ml).  Neck:  Supple; no masses Lungs:  No wheezes, crackles in chest  Heart:  Regular rate and rhythm; BLE 1+ Abdomen:  Soft, non-distended, nontender, BS active, no palp mass    Rectal:  Deferred  Msk:  Symmetrical without gross deformities. . Neurologic:  Alert and  oriented x4;  grossly normal neurologically. Skin:  Intact without significant lesions or rashes.   Intake/Output from previous day: 06/03 0701 - 06/04 0700 In: 2486.5 [I.V.:1116.6; Blood:1022; IV Piggyback:347.9] Out: 1563 [Urine:350; Emesis/NG output:400] Intake/Output this shift: Total I/O In: 818.9 [I.V.:175.9; Blood:643] Out: 433 [Other:433]  Lab Results: Recent Labs    01/19/19 0608 01/20/19 0133 01/20/19 2021 01/20/19 2330  WBC 10.4 12.2* 13.4*  --   HGB 7.5* 7.7* 5.1* 4.8*  HCT 23.5* 24.8* 16.4* 15.2*  PLT 102* 103* 102*  --    BMET Recent Labs    01/19/19 0608 01/19/19 1029 01/20/19 0133  NA  --  133* 131*  K  --  5.5* 5.7*  CL  --  104 102  CO2  --  18* 19*  GLUCOSE  --  130* 102*  BUN  --  103*  109*  CREATININE 3.85* 3.98* 4.29*  CALCIUM  --  10.5* 10.5*   LFT Recent Labs    01/20/19 0133  ALBUMIN 3.7   PT/INR Recent Labs    01/20/19 2021  LABPROT 18.4*  INR 1.6*   Hepatitis Panel No results for input(s): HEPBSAG, HCVAB, HEPAIGM, HEPBIGM in the last 72 hours.    Tye Savoy, NP-C @  01/21/2019, 9:34 AM

## 2019-01-21 NOTE — Procedures (Signed)
Intubation Procedure Note DEAIRE MCWHIRTER [id number] 24-Mar-1941  Procedure: Intubation Indications: Airway protection and maintenance  Procedure Details Consent: Risks of procedure as well as the alternatives and risks of each were explained to the (patient/caregiver).  Consent for procedure obtained. Time Out: Verified patient identification, verified procedure, site/side was marked, verified correct patient position, special equipment/implants available, medications/allergies/relevent history reviewed, required imaging and test results available.  Performed  Maximum sterile technique was used including gloves, hand hygiene and mask.  3    Evaluation Hemodynamic Status: BP stable throughout; O2 sats: stable throughout Patient's Current Condition: stable Complications: No apparent complications Patient did tolerate procedure well. Chest X-ray ordered to verify placement.  CXR: tube position acceptable.   [employer] A Jerimy Johanson 01/21/2019

## 2019-01-21 NOTE — Interval H&P Note (Signed)
History and Physical Interval Note:  01/21/2019 10:42 AM  Scott Mcdowell  has presented today for surgery, with the diagnosis of upper GI bleed.  The various methods of treatment have been discussed with the patient and family. After consideration of risks, benefits and other options for treatment, the patient has consented to  Procedure(s) with comments: ESOPHAGOGASTRODUODENOSCOPY (EGD) (N/A) - at bedside. PCCM intubating patient and he will be ready for EGD by 10: 30-10:40am as a surgical intervention.  The patient's history has been reviewed, patient examined, no change in status, stable for surgery.  I have reviewed the patient's chart and labs.  Questions were answered to the patient's satisfaction.     [place]

## 2019-01-21 NOTE — Progress Notes (Signed)
OT Cancellation Note  Patient Details Name: Scott Mcdowell MRN: [id number] DOB: August 04, 1941   Cancelled Treatment:    Reason Eval/Treat Not Completed: Medical issues which prohibited therapy.  Pt intubated today.  Will check back next week.  [name] 01/21/2019, 1:06 PM  Lesle Chris, OTR/L Acute Rehabilitation Services [phone] [facility] pager [phone] office 01/21/2019

## 2019-01-21 NOTE — Progress Notes (Signed)
End of Shift summary, Patient tolerated CRRT without incident.  3 units of FFP transfused, 2 Units to PRBC one unit of PRBC & PLT's pending. Patient had 3 large maroon stools, noted  300 ml of maroon colored luquid from NG tube.

## 2019-01-21 NOTE — Progress Notes (Signed)
Follow up: Notified by charge RN that pt was transferred to ICU from the floor just prior to shift change but that CCM had not been formally consulted. Pt was transferred d/t deterioration in respiratory status and the need for CRRT. Request that PCCM be formally consulted. Discussed w/ Dr Emmit Alexanders w/ [facility] who requested I see pt at bedside to assess for any urgent needs while awaiting evaluation by PCCM provider from [facility]. At bedside pt noted lying quietly in bed w/ eyes open. He is very pale.  CRRT has been initiated and hypotension has slightly improved on a low dose Neo drip, (92/48). Pt seems alert though quite ill. He is able to answer questions and follow commands. He does have some mild tachypnea w/ some use of accessory muscles. BBS diminished but otherwise CTA. Pt sating 100% on 2L [place]. Platelets have already been ordered by Dr Emmit Alexanders. This NP added 3 units PRBC's for a Hb of 5.1. While at bedside Dr Imelda Pillow w/ CCM arrived to bedside to evaluate. Please see his attached assessment and plan. Appreciate PCCM input and management. Will follow.   Jeryl Columbia, NP-C [facility] Pager [phone]

## 2019-01-21 NOTE — Progress Notes (Addendum)
Aline held at this time, ok per Dr Juanda Chance due to elevated INR/PTT.  Need for aline to be addressed after all blood products given.  RN aware.

## 2019-01-21 NOTE — Procedures (Signed)
Endotracheal Intubation Procedure Note  Indication for endotracheal intubation: potential airway compromise. Airway Assessment: Mallampati Class: II (hard and soft palate, upper portion of tonsils anduvula visible). Sedation: etomidate, fentanyl and midazolam. 20 mg etomidate, 100 mcg fentanyl, 2 mg midazolam Paralytic: rocuronium. 30 mg rocuronium Lidocaine: no. Atropine: no. Equipment: Macintosh 3 laryngoscope blade and 8.69mm cuffed endotracheal tube. Cricoid Pressure: no. Number of attempts: 1. ETT location confirmed by ETCO2 monitor.  Scott Mcdowell A Scott Mcdowell 01/21/2019

## 2019-01-21 NOTE — Progress Notes (Signed)
Patient with hypovolemic shock requiring neosynephrine

## 2019-01-21 NOTE — Progress Notes (Addendum)
Initial Nutrition Assessment  DOCUMENTATION CODES:   Not applicable  INTERVENTION:  - when OGT/NGT able to be placed, recommend Vital High Protein @ 20 ml/hr to advance by 10 ml every 8 hours to reach goal rate of 60 ml/hr with 60 ml prostat TID. - at goal rate, this regimen will provide 2040 kcal, 216 grams protein, and 1204 ml free water.  - recommend monitoring the following labs for patient on CRRT: copper, thiamine, vitamin c, vitamin B6, and zinc.    NUTRITION DIAGNOSIS:   Inadequate oral intake related to inability to eat as evidenced by NPO status.  GOAL:   Patient will meet greater than or equal to 90% of their needs  MONITOR:   Vent status, Labs, Weight trends, I & O's  REASON FOR ASSESSMENT:   Ventilator  ASSESSMENT:   78 year old male who presented with worsening edema. He has a significant past medical history of CVA. Patient reported dyspnea for several weeks, no fever, chest pain or palpitations. He has LLE and scrotal edema. He was admitted for RLL PNA complicated by parapneumonic pleural effusion and AKI.   Patient was previously on Heart Healthy diet and eating 50-100% of meals; most recently documented intakes were 50% of all meals on 6/2. Weight +7.7 kg/16 lb since admission on 5/29. Used admission weight (104.6 kg) to estimate nutrition needs. Patient was intubated around 10:45 AM today. NGT was previously in place after placement yesterday, but RN reports that it was removed and attempts at replacement have been unsuccessful.    Per notes: oliguric AKI with hyperkalemia and metabolic acidosis, started on CRRT 6/3 evening, hematology aware of patient and possible need for biopsy, hemorrhagic shock d/t UGIB with acute blood loss anemia--now s/p 4 units PRBC since admission and then to get fresh frozen plasma, goal MAP >65, RUE DVT, PNA, pulmonary edema, chronic R-sided weakness d/t previous CVA, scrotal cellulitis--resolved.    Patient is currently intubated  on ventilator support MV: 11.1 L/min Temp (24hrs), Avg:97.1 F (36.2 C), Min:95.1 F (35.1 C), Max:98.6 F (37 C) Propofol: none BP: 97/55 and MAP: 69   Medications reviewed; 5000 units cholecalciferol/day, 10 mg IV reglan x1 dose 6/4, 8 mg/hr protonix, 10 mg vitamin K x1 run 6/3, 250 mcg oral cyanocobalamin/day.  Labs reviewed; CBG: 116 mg/dl today, K: 5.5 mmol/l, BUN: 102 mg/dl, creatinine: 3.54 mg/dl, ammonia: 69 umol/L, GFR: 15 ml/min. B12 was 193 pg/ml on 6/1. Iron was 64 ug/dl on 6/1. Drips; neo @ 300 mcg/min, levo @ 2 mcg/min.     NUTRITION - FOCUSED PHYSICAL EXAM:  limited NFPE able to be done; no muscle or fat wasting to areas able to be assessed.   Diet Order:   Diet Order            Diet NPO time specified  Diet effective now              EDUCATION NEEDS:   No education needs have been identified at this time  Skin:  Skin Assessment: Reviewed RN Assessment  Last BM:  6/4  Height:   Ht Readings from Last 1 Encounters:  01/20/19 6\' 2"  (1.88 m)    Weight:   Wt Readings from Last 1 Encounters:  01/21/19 112.3 kg    Ideal Body Weight:  86.4 kg  BMI:  Body mass index is 31.79 kg/m.  Estimated Nutritional Needs:   Kcal:  2074 kcal  Protein:  >/= 210 grams (2 grams/kg)  Fluid:  >/= 2 L/day  Scott Matin, MS, RD, LDN, [facility] Inpatient Clinical Dietitian Pager # [phone] After hours/weekend pager # [phone]

## 2019-01-21 NOTE — H&P (View-Only) (Signed)
Referring Provider:   [facility]        Primary Care Physician:  [name], MD Primary Gastroenterologist:  unassigned           Reason for Consultation:   GI bleed         ASSESSMENT / PLAN:    16.  78 year old male with acute upper GI bleed on heparin for upper extremity DVT.  Patient on pressors progressive hypotension . Maroon-colored stools during the night, none this a.m.  He does have some dark coffee-ground material in NG tube canister. Rule out PUD. U/S earlier this admission revealed ascites and ? Cirrhosis so from portal gastropathy and/or esophageal varices is of concern -Patient needs EGD at bedside.  I spoke with PCCM regarding pulmonary status.  The patient will be intubated for the bedside EGD. I spoke with wife [name] about the risks/ benefits of the EGD and she is agreeable to proceeding. The patient is also comfortable proceeding with the procedure.  -continue with PPI gtt.  -I have already ordered repeat stat serum K+ , H+H, and INR   2. ? Cirrhosis.  He is coagulopathic with low platelet count. Will workup cirrhosis after resolution of acute issues.  -He received a few doses of Maxipime earlier this admission.  About to proceed with upper endoscopy.  If there is portal hypertension suggestive of cirrhosis then he will antibiotics for SBP prophylaxis -I have started Octreotide empirically.   3. Macrocytic anemia / ABL anemia.  He presented with a hemoglobin of 7.3 on 01[birth year]14/1929.  After a unit of blood he had an appropriate rise in hemoglobin but with last night's GI bleed hemoglobin fell from 7.7-4.8.  He has received IV iron this admission. -Patient is status post 3 more units of blood this a.m.  Hemoglobin rose less than 2 units from 5.1-6.8.  Ordering another unit of blood now. -B12 repletion already in process                                                                HPI:   Scott Mcdowell is a 78 y.o. male history of CVA and right-sided  weakness admitted 01/15/2019 with shortness of breath, scrotal edema. He was found to have AKI / ? CKD, volume overload, elevated troponin, macrocytic anemia with hgb of 7.3., and CAP with right pleural effusion.   Patient was admitted for further work-up of above problems. For anemia, he received a unit of blood on 01/16/2019 after hgb declined further to 6.9. Following the unit of blood Hgb remained stable in mid 7 range. He is macrocytic with a vitamin B12 deficiency which is being repleted with improvement in Vit B12 from 120 to 193.  He has received an iron infusion this admission.  Elevated troponin felt to be secondary to AKI. For volume overload he failed diuresis, renal function continued to worsen and he is now getting CRRT.    Patient on antibiotics for CAP. He had a right thoracentesis with fluid studies suggesing a transudative process.   This admission patient found to have a RUE DVT, started on heparin.  Overnight patient he developed respiratory distress and worsening hypotension.  Nursing staff reported 3 large bloody bowel movements.  Hemoglobin declined from 7.7 yesterday to 4.8. He  was transferred to ICU, started on pressor. Heparin held.  3 units of packed red blood cells were ordered, third 1 infusion now.  He was given FFP, vitamin K and DDAVP  This a.m. patient has had any further bleeding per nursing staff.  Post transfusion H&H pending.  Patient denies abdominal pain.  He denies history of PUD.  He denies NSAID use at home but daily baby ASA on home med list. Denies hx of GERD. No GI meds on home med list.  Patient does seem to have some difficulty expressing thoughts.    Past Medical History:  Diagnosis Date  . Stroke [facility])    w right sided weakness    Past Surgical History:  Procedure Laterality Date  . IR FLUORO GUIDE CV LINE RIGHT  01/20/2019  . IR US GUIDE VASC ACCESS RIGHT  01/20/2019    Prior to Admission medications   Medication Sig Start Date End Date Taking?  Authorizing Provider  aspirin 81 MG chewable tablet Chew 81 mg by mouth daily.   Yes [provider]  atorvastatin (LIPITOR) 10 MG tablet Take 10 mg by mouth daily. 11/12/18  Yes [provider]  ramipril (ALTACE) 2.5 MG capsule Take 2.5 mg by mouth daily. 11/12/18  Yes [provider]    Current Facility-Administered Medications  Medication Dose Route Frequency Provider Last Rate Last Dose  .  prismasol BGK 4/2.5 infusion   CRRT Continuous Dwana Melena, MD 500 mL/hr at 01/21/19 [phone]    .  prismasol BGK 4/2.5 infusion   CRRT Continuous Dwana Melena, MD 300 mL/hr at 01/20/19 2100    . chlorhexidine (PERIDEX) 0.12 % solution 15 mL  15 mL Mouth Rinse BID Arrien, Jimmy Picket, MD      . Chlorhexidine Gluconate Cloth 2 % PADS 6 each  6 each Topical Daily Arrien, Jimmy Picket, MD   6 each at 01/20/19 2039  . cholecalciferol (VITAMIN D3) tablet 5,000 Units  5,000 Units Oral Daily Elmarie Shiley, MD   5,000 Units at 01/20/19 [phone]  . lidocaine (XYLOCAINE) 1 % (with pres) injection    PRN Sandi Mariscal, MD   10 mL at 01/20/19 1656  . MEDLINE mouth rinse  15 mL Mouth Rinse q12n4p Arrien, Jimmy Picket, MD      . midodrine (PROAMATINE) tablet 5 mg  5 mg Oral TID WC Elmarie Shiley, MD   5 mg at 01/20/19 1323  . nystatin (MYCOSTATIN/NYSTOP) topical powder   Topical TID Darliss Cheney, MD      . pantoprazole (PROTONIX) 80 mg in sodium chloride 0.9 % 250 mL (0.32 mg/mL) infusion  8 mg/hr Intravenous Continuous Anders Simmonds, MD 25 mL/hr at 01/21/19 0900 8 mg/hr at 01/21/19 0900  . [START ON 01/24/2019] pantoprazole (PROTONIX) injection 40 mg  40 mg Intravenous Q12H Anders Simmonds, MD      . phenylephrine (NEOSYNEPHRINE) 40-0.9 MG/250ML-% infusion  0-400 mcg/min Intravenous Titrated Tawni Millers, MD 45 mL/hr at 01/21/19 0900 120 mcg/min at 01/21/19 0900  . prismasol BGK 4/2.5 infusion   CRRT Continuous Dwana Melena, MD 1,500 mL/hr at 01/21/19 0757    . sodium chloride (OCEAN)  0.65 % nasal spray 1 spray  1 spray Each Nare PRN Darliss Cheney, MD   1 spray at 01/20/19 0957  . sodium chloride 0.9 % primer fluid for CRRT   CRRT PRN Dwana Melena, MD      . sodium chloride flush (NS) 0.9 % injection 10-40 mL  10-40 mL Intracatheter PRN Arrien, Jimmy Picket, MD      . sodium zirconium cyclosilicate [facility]) packet 10 g  10 g Oral BID Elmarie Shiley, MD   10 g at 01/20/19 1505  . vitamin B-12 (CYANOCOBALAMIN) tablet 250 mcg  250 mcg Oral Daily Darliss Cheney, MD   250 mcg at 01/20/19 8889    Allergies as of 01/14/2019  . (No Known Allergies)    Family History  Problem Relation Age of Onset  . Cancer Mother   . Parkinson's disease Father     Social History   Socioeconomic History  . Marital status: Married    Spouse name: Not on file  . Number of children: Not on file  . Years of education: Not on file  . Highest education level: Not on file  Occupational History  . Not on file  Social Needs  . Financial resource strain: Not on file  . Food insecurity:    Worry: Not on file    Inability: Not on file  . Transportation needs:    Medical: Not on file    Non-medical: Not on file  Tobacco Use  . Smoking status: Former Smoker    Types: Cigarettes  . Smokeless tobacco: Never Used  Substance and Sexual Activity  . Alcohol use: Yes    Alcohol/week: 5.0 standard drinks    Types: 5 Cans of beer per week  . Drug use: Not on file  . Sexual activity: Not on file  Lifestyle  . Physical activity:    Days per week: Not on file    Minutes per session: Not on file  . Stress: Not on file  Relationships  . Social connections:    Talks on phone: Not on file    Gets together: Not on file    Attends religious service: Not on file    Active member of club or organization: Not on file    Attends meetings of clubs or organizations: Not on file    Relationship status: Not on file  . Intimate partner violence:    Fear of current or ex partner: Not on file     Emotionally abused: Not on file    Physically abused: Not on file    Forced sexual activity: Not on file  Other Topics Concern  . Not on file  Social History Narrative  . Not on file    Review of Systems: All systems reviewed and negative except where noted in HPI.  Physical Exam: Vital signs in last 24 hours: Temp:  [95.1 F (35.1 C)-98.5 F (36.9 C)] 97.6 F (36.4 C) (06/04 0930) Pulse Rate:  [75-90] 80 (06/04 0930) Resp:  [12-29] 19 (06/04 0930) BP: (84-157)/(40-123) 97/57 (06/04 0930) SpO2:  [92 %-100 %] 100 % (06/04 0930) Weight:  [111.6 kg-112.3 kg] 112.3 kg (06/04 0500) Last BM Date: 01/21/19 General:   Alert, well-developed, male in NAD. On warming blanket Psych:  Pleasant, cooperative.  Eyes:  Pupils equal, sclera clear, no icterus.   Conjunctiva pink. Ears:  Normal auditory acuity. Nose:  No deformity, discharge,  or lesions. NGT >> dark liquid in cannister ( approx 200 ml).  Neck:  Supple; no masses Lungs:  No wheezes, crackles in chest  Heart:  Regular rate and rhythm; BLE 1+ Abdomen:  Soft, non-distended, nontender, BS active, no palp mass    Rectal:  Deferred  Msk:  Symmetrical without gross deformities. . Neurologic:  Alert and  oriented x4;  grossly normal neurologically. Skin:  Intact without significant lesions or rashes.   Intake/Output from previous day: 06/03 0701 - 06/04 0700 In: 2486.5 [I.V.:1116.6; Blood:1022; IV Piggyback:347.9] Out: 1563 [Urine:350; Emesis/NG output:400] Intake/Output this shift: Total I/O In: 818.9 [I.V.:175.9; Blood:643] Out: 433 [Other:433]  Lab Results: Recent Labs    01/19/19 0608 01/20/19 0133 01/20/19 2021 01/20/19 2330  WBC 10.4 12.2* 13.4*  --   HGB 7.5* 7.7* 5.1* 4.8*  HCT 23.5* 24.8* 16.4* 15.2*  PLT 102* 103* 102*  --    BMET Recent Labs    01/19/19 0608 01/19/19 1029 01/20/19 0133  NA  --  133* 131*  K  --  5.5* 5.7*  CL  --  104 102  CO2  --  18* 19*  GLUCOSE  --  130* 102*  BUN  --  103*  109*  CREATININE 3.85* 3.98* 4.29*  CALCIUM  --  10.5* 10.5*   LFT Recent Labs    01/20/19 0133  ALBUMIN 3.7   PT/INR Recent Labs    01/20/19 2021  LABPROT 18.4*  INR 1.6*   Hepatitis Panel No results for input(s): HEPBSAG, HCVAB, HEPAIGM, HEPBIGM in the last 72 hours.    Tye Savoy, NP-C @  01/21/2019, 9:34 AM

## 2019-01-22 ENCOUNTER — Encounter (HOSPITAL_COMMUNITY): Payer: Self-pay | Admitting: Gastroenterology

## 2019-01-22 ENCOUNTER — Inpatient Hospital Stay (HOSPITAL_COMMUNITY): Payer: Medicare Other

## 2019-01-22 LAB — BPAM PLATELET PHERESIS
Blood Product Expiration Date: 202006042359
ISSUE DATE / TIME: 202006041639
Unit Type and Rh: 7300

## 2019-01-22 LAB — RENAL FUNCTION PANEL
Albumin: 3.7 g/dL (ref 3.5–5.0)
Albumin: 3.5 g/dL (ref 3.5–5.0)
Anion gap: 11 (ref 5–15)
Anion gap: 9 (ref 5–15)
BUN: 66 mg/dL — ABNORMAL HIGH (ref 8–23)
BUN: 42 mg/dL — ABNORMAL HIGH (ref 8–23)
CO2: 23 mmol/L (ref 22–32)
CO2: 22 mmol/L (ref 22–32)
Calcium: 8.8 mg/dL — ABNORMAL LOW (ref 8.9–10.3)
Calcium: 8.6 mg/dL — ABNORMAL LOW (ref 8.9–10.3)
Chloride: 100 mmol/L (ref 98–111)
Chloride: 104 mmol/L (ref 98–111)
Creatinine, Ser: 2.74 mg/dL — ABNORMAL HIGH (ref 0.61–1.24)
Creatinine, Ser: 2.19 mg/dL — ABNORMAL HIGH (ref 0.61–1.24)
GFR calc Af Amer: 24 mL/min — ABNORMAL LOW (ref >60)
GFR calc Af Amer: 32 mL/min — ABNORMAL LOW (ref >60)
GFR calc non Af Amer: 21 mL/min — ABNORMAL LOW (ref >60)
GFR calc non Af Amer: 28 mL/min — ABNORMAL LOW (ref >60)
Glucose, Bld: 125 mg/dL — ABNORMAL HIGH (ref 70–99)
Glucose, Bld: 101 mg/dL — ABNORMAL HIGH (ref 70–99)
Phosphorus: 3.8 mg/dL (ref 2.5–4.6)
Phosphorus: 3.3 mg/dL (ref 2.5–4.6)
Potassium: 4.9 mmol/L (ref 3.5–5.1)
Potassium: 4.5 mmol/L (ref 3.5–5.1)
Sodium: 134 mmol/L — ABNORMAL LOW (ref 135–145)
Sodium: 135 mmol/L (ref 135–145)

## 2019-01-22 LAB — BPAM FFP
Blood Product Expiration Date: 202006082359
Blood Product Expiration Date: 202006092359
Blood Product Expiration Date: 202006092359
ISSUE DATE / TIME: 202006032336
ISSUE DATE / TIME: 202006040122
ISSUE DATE / TIME: 202006040233
Unit Type and Rh: 5100
Unit Type and Rh: 5100
Unit Type and Rh: 5100

## 2019-01-22 LAB — CBC WITH DIFFERENTIAL/PLATELET
Abs Immature Granulocytes: 1 10*3/uL — ABNORMAL HIGH (ref 0.00–0.07)
Basophils Absolute: 0.1 10*3/uL (ref 0.0–0.1)
Basophils Relative: 1 %
Eosinophils Absolute: 0.1 10*3/uL (ref 0.0–0.5)
Eosinophils Relative: 1 %
HCT: 22.7 % — ABNORMAL LOW (ref 39.0–52.0)
Hemoglobin: 7.6 g/dL — ABNORMAL LOW (ref 13.0–17.0)
Immature Granulocytes: 8 %
Lymphocytes Relative: 14 %
Lymphs Abs: 1.8 10*3/uL (ref 0.7–4.0)
MCH: 33 pg (ref 26.0–34.0)
MCHC: 33.5 g/dL (ref 30.0–36.0)
MCV: 98.7 fL (ref 80.0–100.0)
Monocytes Absolute: 1.8 10*3/uL — ABNORMAL HIGH (ref 0.1–1.0)
Monocytes Relative: 14 %
Neutro Abs: 8.5 10*3/uL — ABNORMAL HIGH (ref 1.7–7.7)
Neutrophils Relative %: 62 %
Platelets: 122 10*3/uL — ABNORMAL LOW (ref 150–400)
RBC: 2.3 MIL/uL — ABNORMAL LOW (ref 4.22–5.81)
RDW: 16.9 % — ABNORMAL HIGH (ref 11.5–15.5)
WBC: 13.3 10*3/uL — ABNORMAL HIGH (ref 4.0–10.5)
nRBC: 2.8 % — ABNORMAL HIGH (ref 0.0–0.2)

## 2019-01-22 LAB — PREPARE FRESH FROZEN PLASMA
Unit division: 0
Unit division: 0
Unit division: 0

## 2019-01-22 LAB — PREPARE PLATELET PHERESIS: Unit division: 0

## 2019-01-22 LAB — BLOOD GAS, ARTERIAL
Acid-base deficit: 0.8 mmol/L (ref 0.0–2.0)
Bicarbonate: 23.1 mmol/L (ref 20.0–28.0)
Drawn by: 331471
FIO2: 30
MECHVT: 650 mL
O2 Saturation: 97.8 %
PEEP: 5 cmH2O
Patient temperature: 98.6
RATE: 16 resp/min
pCO2 arterial: 36.8 mmHg (ref 32.0–48.0)
pH, Arterial: 7.414 (ref 7.350–7.450)
pO2, Arterial: 104 mmHg (ref 83.0–108.0)

## 2019-01-22 LAB — MAGNESIUM: Magnesium: 2.6 mg/dL — ABNORMAL HIGH (ref 1.7–2.4)

## 2019-01-22 LAB — MPO/PR-3 (ANCA) ANTIBODIES
ANCA Proteinase 3: <3.5 U/mL (ref 0.0–3.5)
Myeloperoxidase Abs: <9 U/mL (ref 0.0–9.0)

## 2019-01-22 NOTE — Progress Notes (Signed)
Patient ID: Scott Mcdowell, male   DOB: [birth year], 78 y.o.   MRN: [id number]  [facility] Progress Note   Assessment/ Plan:   1. 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  Continue CRRT at this time for clearance/volume unloading with decreasing pressor needs.  At some point, will transition to intermittent hemodialysis should he require this (unclear how much functional recovery he may have if myeloma treated). 2.  Volume overload/hyperkalemia: Secondary to acute kidney injury and failed medical management-now CRRT 3.  Anemia/thrombocytopenia with recent acute blood loss anemia from GI bleed: Mildly depressed iron studies, status post intravenous iron and ongoing evaluation for possible plasma cell dyscrasia. 4.  Right arm brachial vein DVT: Previously on heparin drip that was discontinued after he developed GI bleed. 5.  Hypotension/shock: Currently on pressor support, wean as tolerated. 6.  Vitamin D deficiency: Continue cholecalciferol supplementation when stabilized 7.  Upper GI bleed: Identified to be likely from gastric origin status post endoscopic management yesterday relatively stable hemoglobin/hematocrit overnight.  Subjective:   No acute events overnight, able to tolerate net -100 cc UF per hour.   Objective:   BP (!) 99/54   Pulse 82   Temp (!) 97.5 F (36.4 C) (Oral)   Resp (!) 9   Ht 6' 2" (1.88 m)   Wt 110.7 kg   SpO2 100%   BMI 31.33 kg/m   Intake/Output Summary (Last 24 hours) at 01/22/2019 1131 Last data filed at 01/22/2019 1100 Gross per 24 hour  Intake 2894.36 ml  Output 5112 ml  Net -2217.64 ml   Weight change: -0.9 kg  Physical Exam: Gen: Awake/alert, intubated, on CRRT CVS: Pulse regular  rhythm, normal rate, S1-S2 normal Resp: Coarse/ventilator associated breath sounds bilaterally Abd: Soft, obese, nontender Ext: Trace-1+ lower extremity edema, 2+ right upper extremity edema, 1+ left upper extremity edema  Imaging: Dg Abd 1 View  Result Date: 01/20/2019 CLINICAL DATA:  NG tube positioning EXAM: ABDOMEN - 1 VIEW COMPARISON:  None. FINDINGS: The NG tube tip projects over the gastric antrum. The tip is pointed distally. The bowel gas pattern is nonspecific and nonobstructive. IMPRESSION: NG tube tip projects over the gastric antrum. The tip is pointed distally. Electronically Signed   By: Constance Holster M.D.   On: 01/20/2019 22:37   Ir Fluoro Guide Cv Line Right  Result Date: 01/20/2019 INDICATION: Renal insufficiency. In need of intravenous access for the initiation of dialysis. EXAM: NON-TUNNELED CENTRAL VENOUS HEMODIALYSIS CATHETER PLACEMENT WITH ULTRASOUND AND FLUOROSCOPIC GUIDANCE COMPARISON:  Chest CT-01/14/2019 MEDICATIONS: None FLUOROSCOPY TIME:  24 seconds (10 mGy) COMPLICATIONS: None immediate. PROCEDURE: Informed written consent was obtained from the patient after a discussion of the risks, benefits, and alternatives to treatment. Questions regarding the procedure were encouraged and answered. The right neck and chest were prepped with chlorhexidine in a sterile fashion, and a sterile drape was applied covering the operative field. Maximum barrier sterile technique with sterile gowns and gloves were used for the procedure. A timeout was performed prior to the initiation of the procedure. After the overlying soft tissues were anesthetized, a small venotomy incision was created and a micropuncture kit was utilized to access the internal jugular vein. Real-time ultrasound guidance was utilized for vascular access including the acquisition of a permanent ultrasound image documenting patency of the  accessed vessel. The microwire was utilized to measure appropriate catheter length. A  stiff glidewire was advanced to the level of the IVC. Under fluoroscopic guidance, the venotomy was serially dilated, ultimately allowing placement of a 20 cm temporary Trialysis catheter with tip ultimately terminating within the superior aspect of the right atrium. Final catheter positioning was confirmed and documented with a spot radiographic image. The catheter aspirates and flushes normally. The catheter was flushed with appropriate volume heparin dwells. The catheter exit site was secured with a 0-Prolene retention suture. A dressing was placed. The patient tolerated the procedure well without immediate post procedural complication. IMPRESSION: Successful placement of a right internal jugular approach 20 cm temporary dialysis catheter with tip terminating with in the superior aspect of the right atrium. The catheter is ready for immediate use. PLAN: This catheter may be converted to a tunneled dialysis catheter at a later date as indicated. Electronically Signed   By: Sandi Mariscal M.D.   On: 01/20/2019 17:24   Ir US Guide Vasc Access Right  Result Date: 01/21/2019 INDICATION: Renal insufficiency. In need of intravenous access for the initiation of dialysis. EXAM: NON-TUNNELED CENTRAL VENOUS HEMODIALYSIS CATHETER PLACEMENT WITH ULTRASOUND AND FLUOROSCOPIC GUIDANCE COMPARISON:  Chest CT-01/14/2019 MEDICATIONS: None FLUOROSCOPY TIME:  24 seconds (10 mGy) COMPLICATIONS: None immediate. PROCEDURE: Informed written consent was obtained from the patient after a discussion of the risks, benefits, and alternatives to treatment. Questions regarding the procedure were encouraged and answered. The right neck and chest were prepped with chlorhexidine in a sterile fashion, and a sterile drape was applied covering the operative field. Maximum barrier sterile technique with sterile gowns and gloves were used for the procedure. A timeout was performed prior to the initiation of the procedure. After the overlying soft  tissues were anesthetized, a small venotomy incision was created and a micropuncture kit was utilized to access the internal jugular vein. Real-time ultrasound guidance was utilized for vascular access including the acquisition of a permanent ultrasound image documenting patency of the accessed vessel. The microwire was utilized to measure appropriate catheter length. A stiff glidewire was advanced to the level of the IVC. Under fluoroscopic guidance, the venotomy was serially dilated, ultimately allowing placement of a 20 cm temporary Trialysis catheter with tip ultimately terminating within the superior aspect of the right atrium. Final catheter positioning was confirmed and documented with a spot radiographic image. The catheter aspirates and flushes normally. The catheter was flushed with appropriate volume heparin dwells. The catheter exit site was secured with a 0-Prolene retention suture. A dressing was placed. The patient tolerated the procedure well without immediate post procedural complication. IMPRESSION: Successful placement of a right internal jugular approach 20 cm temporary dialysis catheter with tip terminating with in the superior aspect of the right atrium. The catheter is ready for immediate use. PLAN: This catheter may be converted to a tunneled dialysis catheter at a later date as indicated. Electronically Signed   By: Sandi Mariscal M.D.   On: 01/20/2019 17:24   Portable Chest Xray  Result Date: 01/22/2019 CLINICAL DATA:  Endotracheal tube present. EXAM: PORTABLE CHEST 1 VIEW COMPARISON:  Radiograph January 21, 2019. FINDINGS: Stable cardiomegaly. Endotracheal tube is in good position. Right internal jugular dialysis catheter is unchanged. Left internal jugular catheter is unchanged. No pneumothorax is noted. Left lung is clear. Stable right basilar atelectasis or infiltrate is noted with associated pleural effusion. Bony thorax is unremarkable. IMPRESSION: Stable support apparatus. Stable right  basilar opacity as described above. Aortic Atherosclerosis (  ICD10-I70.0). Electronically Signed   By: Marijo Conception M.D.   On: 01/22/2019 07:48   Dg Chest Port 1 View  Result Date: 01/21/2019 CLINICAL DATA:  Encounter for central line placement. EXAM: PORTABLE CHEST 1 VIEW COMPARISON:  Radiograph of January 21, 2019. FINDINGS: Mild cardiomegaly is noted. Endotracheal tube is in grossly good position. Nasogastric tube has been removed. No pneumothorax is noted. Stable position of right internal jugular dialysis catheter with distal tip in expected position of right atrium. Interval placement of left internal jugular catheter with distal tip in expected position of the SVC. Left lung is clear. Mild right basilar atelectasis and small pleural effusion is noted. Bony thorax is unremarkable. IMPRESSION: Interval placement of left internal jugular catheter with distal tip in expected position of SVC. Nasogastric tube has been removed. Otherwise stable support apparatus. No pneumothorax is noted. Mild right basilar atelectasis and effusion is noted. Electronically Signed   By: Marijo Conception M.D.   On: 01/21/2019 16:21   Portable Chest X-ray  Result Date: 01/21/2019 CLINICAL DATA:  Endotracheal tube EXAM: PORTABLE CHEST 1 VIEW COMPARISON:  Yesterday FINDINGS: Endotracheal tube tip between the clavicular heads and carina. Orogastric tube reaches the stomach. Dialysis catheter with tip at the upper cavoatrial junction. Cardiomegaly, stable. Haziness of the right more than left lower chest attributed atelectasis and pleural fluid. Right lateral fifth rib lucent lesion that appears aggressive on prior imaging. These results were called by telephone at the time of interpretation on 01/21/2019 at 11:05 am to Dr. Noe Gens , who verbally acknowledged these results. IMPRESSION: 1. New endotracheal and orogastric tubes in expected position. Mildly improved lung volumes. 2. Lytic lesion in the lateral right fifth rib needing  workup for malignancy Electronically Signed   By: Monte Fantasia M.D.   On: 01/21/2019 11:06   Dg Chest Port 1 View  Result Date: 01/20/2019 CLINICAL DATA:  Central line placement EXAM: PORTABLE CHEST 1 VIEW COMPARISON:  01/16/2019 FINDINGS: There is a well-positioned non tunneled right-sided central venous catheter with tip projecting over the right atrium. The cardiac silhouette is enlarged. There is no pneumothorax. There is a moderate sized right-sided pleural effusion. There is a small left-sided pleural effusion. There is generalized volume overload with evidence of pulmonary edema. IMPRESSION: 1. Well-positioned right-sided dialysis catheter.  No pneumothorax. 2. Cardiomegaly with volume overload including small to moderate-sized bilateral pleural effusions. Electronically Signed   By: Constance Holster M.D.   On: 01/20/2019 20:15    Labs: BMET Recent Labs  Lab 01/17/19 0434 01/18/19 0459 01/19/19 0608 01/19/19 1029 01/20/19 0133 01/21/19 0959 01/21/19 1702 01/22/19 0317  NA 137 133*  --  133* 131* 136 137 134*  K 4.6 5.2*  --  5.5* 5.7* 5.5* 4.9 4.9  CL 106 103  --  104 102 104 107 100  CO2 22 20*  --  18* 19* 20* 21* 23  GLUCOSE 105* 106*  --  130* 102* 107* 104* 125*  BUN 77* 90*  --  103* 109* 102* 87* 66*  CREATININE 2.40* 3.12* 3.85* 3.98* 4.29* 3.54* 2.88* 2.74*  CALCIUM 10.9* 10.5*  --  10.5* 10.5* 9.3 8.8* 8.8*  PHOS  --   --   --   --  6.8*  --   --  3.8   CBC Recent Labs  Lab 01/20/19 2021 01/20/19 2330 01/21/19 0959 01/21/19 1702 01/22/19 0317  WBC 13.4*  --  12.2* 13.1* 13.3*  NEUTROABS 8.0*  --  7.8* 8.0*  8.5*  HGB 5.1* 4.8* 6.8* 7.7* 7.6*  HCT 16.4* 15.2* 21.2* 23.1* 22.7*  MCV 105.8*  --  100.5* 98.3 98.7  PLT 102*  --  89* 94* 122*   Medications:    . chlorhexidine gluconate (MEDLINE KIT)  15 mL Mouth Rinse BID  . Chlorhexidine Gluconate Cloth  6 each Topical Daily  . mouth rinse  15 mL Mouth Rinse q12n4p  . mouth rinse  15 mL Mouth Rinse 10  times per day  . midodrine  5 mg Oral TID WC  . nystatin   Topical TID  . [START ON 01/24/2019] pantoprazole  40 mg Intravenous Q12H   Elmarie Shiley, MD 01/22/2019, 11:31 AM

## 2019-01-22 NOTE — Progress Notes (Signed)
Inpatient Rehabilitation Admissions Coordinator  We will sign off at this time. Please re consult when appropriate.  Danne Baxter, RN, MSN Rehab Admissions Coordinator [phone] 01/22/2019 1:18 PM

## 2019-01-22 NOTE — Progress Notes (Signed)
PT Cancellation Note  Patient Details Name: Scott Mcdowell MRN: [id number] DOB: 1940/11/02   Cancelled Treatment:     PT deferred this date, pt condition decline and pt currently on ventilator.  Will follow.   Pheng Prokop 01/22/2019, 12:12 PM

## 2019-01-22 NOTE — Progress Notes (Signed)
Progress Note    ASSESSMENT AND PLAN:   62.  78 year old male with acute upper GI bleed on heparin for upper extremity DVT.  EGD >>> duodenal bulb with several areas of friable tissue with oozing. An actively oozing visible vessel was found in the area, four clips placed. There lesions are high risk for rebleeding in setting of coagulopathy. Of note, lytic lesions seen on xray, does he have underlying myeloma?No further bleeding. Scant amount of blood tinged fluid in OG tube. Still on Levophed -monitor for recurrent bleeding.  -correct coagulopathy as needed. INR down to 1.3 yesterday after Vit K, FFP -continue PPI gtt -Octreotide stopped  2. ? Cirrhosis.  He is coagulopathic with low platelet count. Will need workup after resolution of acute issues. No evidence for portal HTN on EGD  3. Macrocytic anemia with B12 deficiency / ABL anemia.  He presented with a hemoglobin of 7.3 on 01/14/1929.  After a unit of blood he had an appropriate rise in hemoglobin but following GI bleed hemoglobin fell from 7.7-4.8.   -He is s/p dose of IV iron and total of 5 uPRBC.  Hgb has remained stable overnight ~ 7.6.   -B12 repletion already in process     4. AKI / probably CKD / volume overload. Getting CRRT    SUBJECTIVE   Alert and weaning off vent. Shakes head no for any abdominal discomfort   OBJECTIVE:     Vital signs in last 24 hours: Temp:  [97.4 F (36.3 C)-98.7 F (37.1 C)] 97.5 F (36.4 C) (06/05 0800) Pulse Rate:  [65-93] 82 (06/05 1115) Resp:  [9-25] 9 (06/05 1115) BP: (75-146)/(38-107) 99/54 (06/05 1115) SpO2:  [100 %] 100 % (06/05 1115) FiO2 (%):  [30 %] 30 % (06/05 0901) Weight:  [110.7 kg] 110.7 kg (06/05 0424) Last BM Date: 01/21/19 General:   Alert, well-developed male in NAD EENT:  ETT. Weaning in progress  Heart:  Regular rate and rhythm Abdomen:  Soft, nondistended, nontender.   Psych: cooperative. Interactive   Intake/Output from previous day: 06/04 0701 -  06/05 0700 In: 3849.4 [I.V.:2615.7; Blood:1233.7] Out: 5085 [Urine:71] Intake/Output this shift: Total I/O In: 302.3 [I.V.:302.3] Out: 729 [Urine:10; Other:719]  Lab Results: Recent Labs    01/21/19 0959 01/21/19 1702 01/22/19 0317  WBC 12.2* 13.1* 13.3*  HGB 6.8* 7.7* 7.6*  HCT 21.2* 23.1* 22.7*  PLT 89* 94* 122*   BMET Recent Labs    01/21/19 0959 01/21/19 1702 01/22/19 0317  NA 136 137 134*  K 5.5* 4.9 4.9  CL 104 107 100  CO2 20* 21* 23  GLUCOSE 107* 104* 125*  BUN 102* 87* 66*  CREATININE 3.54* 2.88* 2.74*  CALCIUM 9.3 8.8* 8.8*   LFT Recent Labs    01/22/19 0317  ALBUMIN 3.7   PT/INR Recent Labs    01/20/19 2021 01/21/19 0959  LABPROT 18.4* 16.5*  INR 1.6* 1.3*   Hepatitis Panel No results for input(s): HEPBSAG, HCVAB, HEPAIGM, HEPBIGM in the last 72 hours.   Ir Fluoro Guide Cv Line Right  Result Date: 01/20/2019 INDICATION: Renal insufficiency. In need of intravenous access for the initiation of dialysis. EXAM: NON-TUNNELED CENTRAL VENOUS HEMODIALYSIS CATHETER PLACEMENT WITH ULTRASOUND AND FLUOROSCOPIC GUIDANCE COMPARISON:  Chest CT-01/14/2019 MEDICATIONS: None FLUOROSCOPY TIME:  24 seconds (10 mGy) COMPLICATIONS: None immediate. PROCEDURE: Informed written consent was obtained from the patient after a discussion of the risks, benefits, and alternatives to treatment. Questions regarding the procedure were encouraged and answered. The  right neck and chest were prepped with chlorhexidine in a sterile fashion, and a sterile drape was applied covering the operative field. Maximum barrier sterile technique with sterile gowns and gloves were used for the procedure. A timeout was performed prior to the initiation of the procedure. After the overlying soft tissues were anesthetized, a small venotomy incision was created and a micropuncture kit was utilized to access the internal jugular vein. Real-time ultrasound guidance was utilized for vascular access including  the acquisition of a permanent ultrasound image documenting patency of the accessed vessel. The microwire was utilized to measure appropriate catheter length. A stiff glidewire was advanced to the level of the IVC. Under fluoroscopic guidance, the venotomy was serially dilated, ultimately allowing placement of a 20 cm temporary Trialysis catheter with tip ultimately terminating within the superior aspect of the right atrium. Final catheter positioning was confirmed and documented with a spot radiographic image. The catheter aspirates and flushes normally. The catheter was flushed with appropriate volume heparin dwells. The catheter exit site was secured with a 0-Prolene retention suture. A dressing was placed. The patient tolerated the procedure well without immediate post procedural complication. IMPRESSION: Successful placement of a right internal jugular approach 20 cm temporary dialysis catheter with tip terminating with in the superior aspect of the right atrium. The catheter is ready for immediate use. PLAN: This catheter may be converted to a tunneled dialysis catheter at a later date as indicated. Electronically Signed   By: Sandi Mariscal M.D.   On: 01/20/2019 17:24    Principal Problem:   Shock ([place]) Active Problems:   ARF (acute renal failure) (HCC)   Hyperkalemia   B12 deficiency anemia   Pleural effusion on right   Ascites   CAP (community acquired pneumonia)   Elevated troponin   Acute blood loss anemia   Upper GI bleed     LOS: 8 days   Tye Savoy ,NP 01/22/2019, 11:21 AM

## 2019-01-22 NOTE — Progress Notes (Addendum)
NAME:  Scott Mcdowell, MRN:  [id number], DOB:  19-Jul-1941, LOS: 8 ADMISSION DATE:  01/14/2019, CONSULTATION DATE:  6.3.2020 REFERRING MD:  Hospitalist, [facility] CHIEF COMPLAINT: Hypotension with maroon stools  Brief History   78 y/o M, with CKD, admitted 5/28 with reports of SOB in the setting of suspected PNA and SOB.  While on the floor, he developed melena.  He was on heparin for RUE DVT. The patient was transferred to ICU 6/3 with hypotension.    PMH  CKD - was not HD dependent prior to this admit Dyslipidemia  Questionable HTN  Consults:  Nephrology IR Critical Care GI  Procedures:  Tunneled HD 5/30 >> ETT 6/4 >>  Significant Diagnostic Tests:  ECHO 5/29 >> LVEF 60-65%, RA moderately dilated Thoracentesis 5/30 >> UE Venous Duplex 6/2 >> positive for acute R brachial vein DVT  Cytology R Pleural Fluid 5/30 >> reactive cells, no evidence of malignancy  EGD 6/4 >> Normal esophagus. Adherent clot in the distal gastric body, unclear if NG tube trauma but treated with hemostasis clip and epi. Friable mucosa in the proximal stomach with contact oozing. Red blood with active bleeding and clots in the duodenal bulb. 4 discrete areas of adherent clot with extremely friable tissue but no focal ulceration appreciated. One of these areas had a visible vessel which was bleeding, another lesion had active oozing. 2 areas treated as outlined above. 4 clips placed total in the duodenal bulb. Patient with lytic lesions noted on xray, unclear if he has underlying myeloma? These lesions appear to have high risk for relbeeding in the setting of coagulopathy. No active bleeding noted following endoscopic intervention.  Micro Data:  U. Strep 5/29 >> negative  COVID 5/29 >> negative RVP 5/30 >> negative Pleural Fluid 5/30 >> negative   BCx2 5/29 >> negative  BCx2 5/30 >> negative   Antimicrobials:  Azithro 5/29 >> 5/31  Vanco 5/29 >> 5/30 Cefepime 5/30 >> 6/4  Objective   Blood  pressure (!) 107/59, pulse 91, temperature (!) 97.4 F (36.3 C), temperature source Oral, resp. rate 19, height _0  (1.88 m), weight 110.7 kg, SpO2 100 %. CVP:  [12 mmHg-19 mmHg] 14 mmHg  Vent Mode: PRVC FiO2 (%):  [30 %-50 %] 30 % Set Rate:  [16 bmp] 16 bmp Vt Set:  [650 mL] 650 mL PEEP:  [5 cmH20] 5 cmH20 Plateau Pressure:  [12 WUG89-16 cmH20] 12 cmH20   Intake/Output Summary (Last 24 hours) at 01/22/2019 0749 Last data filed at 01/22/2019 0700 Gross per 24 hour  Intake 3523.38 ml  Output 5085 ml  Net -1561.62 ml   Filed Weights   01/20/19 1921 01/21/19 0500 01/22/19 0424  Weight: 111.6 kg 112.3 kg 110.7 kg    Examination: General: critically ill appearing elderly male lying in bed in NAD HEENT: MM pink/moist, ETT, R IJ Perm cath Neuro: Awake, alert, interacts appropriately  CV: s1s2 rrr, no m/r/g PULM: even/non-labored, lungs bilaterally clear anterior, diminished lateral bases  XI:HWTU, non-tender, bsx4 active  Extremities: warm/dry, trace edema  Skin: no rashes or lesions  Assessment & Plan:   Shock in setting of ABLA secondary to GIB & Anticoagulation Administration -elevated INR, ammonia, cirrhotic changes on CT raises question of variceal bleeding P: Goal Hgb >7% Transfuse per ICU guidelines  No anticoagulation due to bleeding, SCD's  Appreciate GI input PPI  Octreotide gtt stopped 6/5 per GI  ABLA Acquired Coagulopathy  RUE DVT  Thrombocytopenia  -lab review shows anemia from admit P:  Trend CBC Doubt he will be able to go back on anticoagulation due to bleeding risk  Monitor for further bleeding  CKD  -was not on HD prior to admit Electrolyte Disturbances - Hypokalemia, Hypokalemia, Hypercalcemia P: Appreciate Nephrology  Trend BMP / urinary output Replace electrolytes as indicated Avoid nephrotoxic agents as able, ensure adequate renal perfusion  Bilateral R>L Pleural Effusions  -suspect in setting of renal failure P: Follow CXR intermittently   Volume removal per Nephrology   Mechanical Ventilation for Airway Protection P:  PRVC 8cc/kg  Wean PEEP / FiO2 for sats >90% PSV as tolerated > goal for extubation cleared from GI standpoint in regards to further procedures   Follow CXR intermittently  PAD protocol with intermittent sedation   Abnormal Rib Findings on XRAY, Scapular Lesions on CT, Elevated Light Chains  P: Concern for possible multiple myeloma  Consider ONC evaluation once stabilized.  TRH had previously discussed elevated light chains with ONC but waiting on follow up labs before formal consult  Send out labs requested by ONC pending from 6/3 >>  Rule Out SBP P: Doubt, no fever or WBC elevation to suggest Monitor off abx   Hx CVA -baseline wheelchair dependent P: PT efforts when able   Best practice:  Diet: NPO Pain/Anxiety/Delirium protocol (if indicated): PRN fentanyl / versed DVT prophylaxis: SCD ordered GI prophylaxis: PPI + Octreotide Glucose control: n/a Mobility: As tolerated  Code Status: Full Code Family Communication: Wife and Daughter updated via phone on plan of care 6/5. Confirmed full code.  [name]'s number is [phone], Mary's phone [phone] Disposition: ICU  Labs   CBC: Recent Labs  Lab 01/20/19 0133 01/20/19 2021 01/20/19 2330 01/21/19 0959 01/21/19 1702 01/22/19 0317  WBC 12.2* 13.4*  --  12.2* 13.1* 13.3*  NEUTROABS 7.6 8.0*  --  7.8* 8.0* 8.5*  HGB 7.7* 5.1* 4.8* 6.8* 7.7* 7.6*  HCT 24.8* 16.4* 15.2* 21.2* 23.1* 22.7*  MCV 106.9* 105.8*  --  100.5* 98.3 98.7  PLT 103* 102*  --  89* 94* 122*    Basic Metabolic Panel: Recent Labs  Lab 01/16/19 0956  01/19/19 1029 01/20/19 0133 01/21/19 0959 01/21/19 1702 01/22/19 0317  NA 138   < > 133* 131* 136 137 134*  K 4.3   < > 5.5* 5.7* 5.5* 4.9 4.9  CL 107   < > 104 102 104 107 100  CO2 22   < > 18* 19* 20* 21* 23  GLUCOSE 145*   < > 130* 102* 107* 104* 125*  BUN 82*   < > 103* 109* 102* 87* 66*  CREATININE  2.61*   < > 3.98* 4.29* 3.54* 2.88* 2.74*  CALCIUM 11.1*   < > 10.5* 10.5* 9.3 8.8* 8.8*  MG 2.8*  --   --   --   --   --  2.6*  PHOS  --   --   --  6.8*  --   --  3.8   < > = values in this interval not displayed.   GFR: Estimated Creatinine Clearance: 28.9 mL/min (A) (by C-G formula based on SCr of 2.74 mg/dL (H)). Recent Labs  Lab 01/20/19 2021 01/20/19 2330 01/21/19 0959 01/21/19 1702 01/22/19 0317  PROCALCITON  --  0.27  --   --   --   WBC 13.4*  --  12.2* 13.1* 13.3*    Liver Function Tests: Recent Labs  Lab 01/16/19 0956 01/17/19 0434 01/18/19 0459 01/20/19 0133 01/22/19 0317  AST 25 23  26  --   --   ALT _0 --   --   ALKPHOS 59 59 60  --   --   BILITOT 1.2 0.9 1.0  --   --   PROT 6.4* 6.2* 6.1*  --   --   ALBUMIN 3.9 3.7 3.7 3.7 3.7   No results for input(s): LIPASE, AMYLASE in the last 168 hours. Recent Labs  Lab 01/20/19 2330  AMMONIA 69*    ABG    Component Value Date/Time   PHART 7.368 01/21/2019 1300   PCO2ART 36.4 01/21/2019 1300   PO2ART 197 (H) 01/21/2019 1300   HCO3 20.5 01/21/2019 1300   ACIDBASEDEF 3.9 (H) 01/21/2019 1300   O2SAT 99.4 01/21/2019 1300     Coagulation Profile: Recent Labs  Lab 01/20/19 2021 01/21/19 0959  INR 1.6* 1.3*    Cardiac Enzymes: Recent Labs  Lab 01/15/19 1242 01/16/19 0956 01/16/19 1538  TROPONINI 0.05* 0.07* 0.06*    HbA1C: No results found for: HGBA1C  CBG: Recent Labs  Lab 01/20/19 2346 01/21/19 0322 01/21/19 2351  GLUCAP 123* 116* 117*     Critical care time: 40 minutes    Noe Gens, NP-C [facility] Pgr: [phone] or if no answer [phone] 01/22/2019, 7:49 AM

## 2019-01-22 NOTE — Progress Notes (Addendum)
Patient extubated at 16:40 after weaning full day shift. Tolerated extubation well, requiring no oxygen after. Satting 98% on room air at this time. Family updated by phone and spoke with patient. Patient denies feeling short or breath or pain at this time. Will continue to monitor.

## 2019-01-23 ENCOUNTER — Inpatient Hospital Stay (HOSPITAL_COMMUNITY): Payer: Medicare Other

## 2019-01-23 DIAGNOSIS — N179 Acute kidney failure, unspecified: Secondary | ICD-10-CM

## 2019-01-23 DIAGNOSIS — K922 Gastrointestinal hemorrhage, unspecified: Secondary | ICD-10-CM

## 2019-01-23 DIAGNOSIS — R578 Other shock: Secondary | ICD-10-CM

## 2019-01-23 LAB — RENAL FUNCTION PANEL
Albumin: 3.7 g/dL (ref 3.5–5.0)
Albumin: 3.7 g/dL (ref 3.5–5.0)
Anion gap: 9 (ref 5–15)
Anion gap: 7 (ref 5–15)
BUN: 37 mg/dL — ABNORMAL HIGH (ref 8–23)
BUN: 28 mg/dL — ABNORMAL HIGH (ref 8–23)
CO2: 24 mmol/L (ref 22–32)
CO2: 25 mmol/L (ref 22–32)
Calcium: 9.1 mg/dL (ref 8.9–10.3)
Calcium: 9 mg/dL (ref 8.9–10.3)
Chloride: 103 mmol/L (ref 98–111)
Chloride: 102 mmol/L (ref 98–111)
Creatinine, Ser: 2.14 mg/dL — ABNORMAL HIGH (ref 0.61–1.24)
Creatinine, Ser: 2.02 mg/dL — ABNORMAL HIGH (ref 0.61–1.24)
GFR calc Af Amer: 33 mL/min — ABNORMAL LOW (ref >60)
GFR calc Af Amer: 35 mL/min — ABNORMAL LOW (ref >60)
GFR calc non Af Amer: 28 mL/min — ABNORMAL LOW (ref >60)
GFR calc non Af Amer: 30 mL/min — ABNORMAL LOW (ref >60)
Glucose, Bld: 105 mg/dL — ABNORMAL HIGH (ref 70–99)
Glucose, Bld: 138 mg/dL — ABNORMAL HIGH (ref 70–99)
Phosphorus: 3.2 mg/dL (ref 2.5–4.6)
Phosphorus: 2.6 mg/dL (ref 2.5–4.6)
Potassium: 4.8 mmol/L (ref 3.5–5.1)
Potassium: 4.7 mmol/L (ref 3.5–5.1)
Sodium: 136 mmol/L (ref 135–145)
Sodium: 134 mmol/L — ABNORMAL LOW (ref 135–145)

## 2019-01-23 LAB — HEMOGLOBIN AND HEMATOCRIT, BLOOD
HCT: 23.8 % — ABNORMAL LOW (ref 39.0–52.0)
Hemoglobin: 7.6 g/dL — ABNORMAL LOW (ref 13.0–17.0)

## 2019-01-23 LAB — CBC
HCT: 21.7 % — ABNORMAL LOW (ref 39.0–52.0)
Hemoglobin: 7 g/dL — ABNORMAL LOW (ref 13.0–17.0)
MCH: 32.7 pg (ref 26.0–34.0)
MCHC: 32.3 g/dL (ref 30.0–36.0)
MCV: 101.4 fL — ABNORMAL HIGH (ref 80.0–100.0)
Platelets: 106 10*3/uL — ABNORMAL LOW (ref 150–400)
RBC: 2.14 MIL/uL — ABNORMAL LOW (ref 4.22–5.81)
RDW: 16.5 % — ABNORMAL HIGH (ref 11.5–15.5)
WBC: 10.7 10*3/uL — ABNORMAL HIGH (ref 4.0–10.5)
nRBC: 3.8 % — ABNORMAL HIGH (ref 0.0–0.2)

## 2019-01-23 LAB — PREPARE RBC (CROSSMATCH)

## 2019-01-23 LAB — ACID FAST SMEAR (AFB, MYCOBACTERIA): Acid Fast Smear: NEGATIVE

## 2019-01-23 LAB — MAGNESIUM: Magnesium: 2.6 mg/dL — ABNORMAL HIGH (ref 1.7–2.4)

## 2019-01-23 NOTE — Progress Notes (Signed)
NAME:  Scott Mcdowell, MRN:  [id number], DOB:  1940/09/08, LOS: 9 ADMISSION DATE:  01/14/2019, CONSULTATION DATE:  01/20/2019 REFERRING MD:  Dr. Maudie Mercury, [facility] CHIEF COMPLAINT: GI bleeding  Brief History   78 y/o M, with CKD, admitted 5/28 with reports of SOB in the setting of suspected PNA and SOB.  While on the floor, he developed melena.  He was on heparin for RUE DVT. The patient was transferred to ICU 6/3 with hypotension.    PMH  CKD, HLD, HTN, CVA (wheel chair dependent)  Consults:  Nephrology IR GI  Procedures:  Tunneled HD 5/30 >> ETT 6/4 >> 6/5 Lt IJ CVL 6/5 >>  Significant Diagnostic Tests:  ECHO 5/29 >> LVEF 60-65%, RA moderately dilated Thoracentesis 5/30 >> glucose 122, protein < 3, LDH 122, WBC 217, cytology - reactive cells UE Venous Duplex 6/2 >> positive for acute R brachial vein DVT  EGD 6/4 >> adherent clot in distal gastric body, friable stomach mucosa, bleeding from duodenum with visible vessel tx with clips  Micro Data:  U. Strep 5/29 >> negative  COVID 5/29 >> negative RVP 5/30 >> negative Pleural Fluid 5/30 >> negative   BCx2 5/29 >> negative  BCx2 5/30 >> negative   Antimicrobials:  Azithro 5/29 >> 5/31  Vanco 5/29 >> 5/30 Cefepime 5/30 >> 6/4  Objective   Blood pressure (!) 101/53, pulse 85, temperature 97.6 F (36.4 C), temperature source Oral, resp. rate 17, height '6\' 2"'  (1.88 m), weight 108.3 kg, SpO2 98 %. CVP:  [8 mmHg-20 mmHg] 16 mmHg  Vent Mode: PSV;CPAP FiO2 (%):  [30 %] 30 % PEEP:  [5 cmH20] 5 cmH20 Pressure Support:  [10 cmH20] 10 cmH20 Plateau Pressure:  [12 cmH20] 12 cmH20   Intake/Output Summary (Last 24 hours) at 01/23/2019 0855 Last data filed at 01/23/2019 0800 Gross per 24 hour  Intake 1126.52 ml  Output 3183 ml  Net -2056.48 ml   Filed Weights   01/21/19 0500 01/22/19 0424 01/23/19 0451  Weight: 112.3 kg 110.7 kg 108.3 kg    Examination:  General - alert Eyes - pupils reactive ENT - no sinus tenderness, no  stridor, dry blood around nares Cardiac - regular rate/rhythm, no murmur Chest - equal breath sounds b/l, no wheezing or rales Abdomen - soft, non tender, + bowel sounds Extremities - swelling Rt arm Skin - multiple areas of ecchymoses Neuro - follows commands, moves all extremities  CXR (reviewed by me) - interstitial edema with pleural effusions Rt > Lt  Resolved Problems:  Hyperkalemia, Compromised airway   Assessment & Plan:   Hemorrhagic shock from Upper GI bleeding. 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 Plan - GI following  Rt arm DVT. Plan - monitor off anticoagulation for now  AKI from ATN. CKD 4. Plan - continue CRRT  Transudate pleural effusions. 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 Plan - Consider ONC evaluation once stabilized.  TRH had previously discussed elevated light chains with ONC but waiting on follow up labs before formal consult  - Send out labs requested by ONC pending from 6/3   Best practice:  Diet: clear liquids DVT prophylaxis: SCDs GI prophylaxis: protonix Mobility: Bed rest Code Status: Full Code Disposition: ICU  Labs    CMP Latest Ref  Rng & Units 01/23/2019 01/22/2019 01/22/2019  Glucose 70 - 99 mg/dL 105(H) 101(H) 125(H)  BUN 8 - 23 mg/dL 37(H) 42(H) 66(H)  Creatinine 0.61 - 1.24 mg/dL 2.14(H) 2.19(H) 2.74(H)  Sodium 135 - 145 mmol/L 136 135 134(L)  Potassium 3.5 - 5.1 mmol/L 4.8 4.5 4.9  Chloride 98 - 111 mmol/L 103 104 100  CO2 22 - 32 mmol/L '24 22 23  ' Calcium 8.9 - 10.3 mg/dL 9.1 8.6(L) 8.8(L)  Total Protein 6.5 - 8.1 g/dL - - -  Total Bilirubin 0.3 - 1.2 mg/dL - - -  Alkaline Phos 38 - 126 U/L - - -  AST 15 - 41 U/L - - -  ALT 0 - 44 U/L - - -   CBC Latest Ref Rng & Units 01/23/2019 01/22/2019 01/21/2019  WBC 4.0 - 10.5  K/uL 10.7(H) 13.3(H) 13.1(H)  Hemoglobin 13.0 - 17.0 g/dL 7.0(L) 7.6(L) 7.7(L)  Hematocrit 39.0 - 52.0 % 21.7(L) 22.7(L) 23.1(L)  Platelets 150 - 400 K/uL 106(L) 122(L) 94(L)   CBG (last 3)  Recent Labs    01/20/19 2346 01/21/19 0322 01/21/19 2351  GLUCAP 123* 116* 117*   ABG    Component Value Date/Time   PHART 7.414 01/22/2019 0839   PCO2ART 36.8 01/22/2019 0839   PO2ART 104 01/22/2019 0839   HCO3 23.1 01/22/2019 0839   ACIDBASEDEF 0.8 01/22/2019 0839   O2SAT 97.8 01/22/2019 0839   CC time 31 minutes  Chesley Mires, MD [place] 01/23/2019, 9:10 AM

## 2019-01-23 NOTE — Progress Notes (Signed)
[facility] Progress Note  CC:  Severe GI bleed   Subjective: He is sitting up in bed tolerating clear liquid diet. No N/V or abdominal pain. Levophed weaned down to 25mcg/min. No melena or BM per ICU RN. CRT in process.   Objective:  Vital signs in last 24 hours: Temp:  [97.5 F (36.4 C)-97.9 F (36.6 C)] 97.6 F (36.4 C) (06/06 0837) Pulse Rate:  [78-98] 80 (06/06 0900) Resp:  [9-27] 14 (06/06 0900) BP: (86-117)/(39-73) 102/51 (06/06 0900) SpO2:  [97 %-100 %] 98 % (06/06 0900) FiO2 (%):  [30 %] 30 % (06/05 1602) Weight:  [108.3 kg] 108.3 kg (06/06 0451) Last BM Date: 01/22/19 General:   Alert, fatigued appearing male in NAD. Heart: RRR, no murmurs. Pulm: crackles LLL otherwise clear lung fields. Abdomen: Protuberant, soft, nontender, no masses. + BS x 4 quads. Extremities: 1 to 2 + pitting edema upper and lower extremities. Sequential stockings in use. Neurologic:  Alert and  oriented x4;  grossly normal neurologically. Psych:  Alert and cooperative. Normal mood and affect.  Intake/Output from previous day: 06/05 0701 - 06/06 0700 In: 1162.8 [I.V.:1162.8] Out: 4650 [Urine:10] Intake/Output this shift: Total I/O In: 180.8 [I.V.:83.8; Blood:97.1] Out: 294 [Other:294]  Lab Results: Recent Labs    01/21/19 1702 01/22/19 0317 01/23/19 0500  WBC 13.1* 13.3* 10.7*  HGB 7.7* 7.6* 7.0*  HCT 23.1* 22.7* 21.7*  PLT 94* 122* 106*   BMET Recent Labs    01/22/19 0317 01/22/19 1600 01/23/19 0500  NA 134* 135 136  K 4.9 4.5 4.8  CL 100 104 103  CO2 23 22 24   GLUCOSE 125* 101* 105*  BUN 66* 42* 37*  CREATININE 2.74* 2.19* 2.14*  CALCIUM 8.8* 8.6* 9.1   LFT Recent Labs    01/23/19 0500  ALBUMIN 3.7   PT/INR Recent Labs    01/20/19 2021 01/21/19 0959  LABPROT 18.4* 16.5*  INR 1.6* 1.3*   Hepatitis Panel No results for input(s): HEPBSAG, HCVAB, HEPAIGM, HEPBIGM in the last 72 hours.  Dg Chest Port 1 View  Result Date: 01/23/2019  CLINICAL DATA:  Acute respiratory failure EXAM: PORTABLE CHEST 1 VIEW COMPARISON:  Chest radiograph 01/22/2019 FINDINGS: Central venous catheter tip projects over the superior vena cava. Dual lumen central venous catheter tip projects over the superior vena cava. Monitoring leads overlie the patient. Stable cardiomegaly. Aortic atherosclerosis. Interval extubation. Low lung volumes. Layering right pleural effusion. Diffuse bilateral interstitial opacities, mildly increased, right greater than left. No pneumothorax. IMPRESSION: Interval extubation. Persistent layering right pleural effusion. Mild increased bilateral airspace opacities, right greater than left. Layering right pleural effusion. Electronically Signed   By: Lovey Newcomer M.D.   On: 01/23/2019 05:42   Portable Chest Xray  Result Date: 01/22/2019 CLINICAL DATA:  Endotracheal tube present. EXAM: PORTABLE CHEST 1 VIEW COMPARISON:  Radiograph January 21, 2019. FINDINGS: Stable cardiomegaly. Endotracheal tube is in good position. Right internal jugular dialysis catheter is unchanged. Left internal jugular catheter is unchanged. No pneumothorax is noted. Left lung is clear. Stable right basilar atelectasis or infiltrate is noted with associated pleural effusion. Bony thorax is unremarkable. IMPRESSION: Stable support apparatus. Stable right basilar opacity as described above. Aortic Atherosclerosis (ICD10-I70.0). Electronically Signed   By: Marijo Conception M.D.   On: 01/22/2019 07:48   Dg Chest Port 1 View  Result Date: 01/21/2019 CLINICAL DATA:  Encounter for central line placement. EXAM: PORTABLE CHEST 1 VIEW COMPARISON:  Radiograph of January 21, 2019. FINDINGS: Mild  cardiomegaly is noted. Endotracheal tube is in grossly good position. Nasogastric tube has been removed. No pneumothorax is noted. Stable position of right internal jugular dialysis catheter with distal tip in expected position of right atrium. Interval placement of left internal jugular catheter  with distal tip in expected position of the SVC. Left lung is clear. Mild right basilar atelectasis and small pleural effusion is noted. Bony thorax is unremarkable. IMPRESSION: Interval placement of left internal jugular catheter with distal tip in expected position of SVC. Nasogastric tube has been removed. Otherwise stable support apparatus. No pneumothorax is noted. Mild right basilar atelectasis and effusion is noted. Electronically Signed   By: Marijo Conception M.D.   On: 01/21/2019 16:21   Portable Chest X-ray  Result Date: 01/21/2019 CLINICAL DATA:  Endotracheal tube EXAM: PORTABLE CHEST 1 VIEW COMPARISON:  Yesterday FINDINGS: Endotracheal tube tip between the clavicular heads and carina. Orogastric tube reaches the stomach. Dialysis catheter with tip at the upper cavoatrial junction. Cardiomegaly, stable. Haziness of the right more than left lower chest attributed atelectasis and pleural fluid. Right lateral fifth rib lucent lesion that appears aggressive on prior imaging. These results were called by telephone at the time of interpretation on 01/21/2019 at 11:05 am to Dr. Noe Gens , who verbally acknowledged these results. IMPRESSION: 1. New endotracheal and orogastric tubes in expected position. Mildly improved lung volumes. 2. Lytic lesion in the lateral right fifth rib needing workup for malignancy Electronically Signed   By: Monte Fantasia M.D.   On: 01/21/2019 11:06    Assessment / Plan:  1. 78 y.o. male with  Acute UGI Bleed. S/P EGD 6/4 which showed duodenal bulb with several areas of friable tissue with oozing. An actively oozing visible vessel was found in the area, four clips placed. There lesions are high risk for rebleeding in setting of coagulopathy.  6/6 Hg 7.0 down from 7.6. BUN 37 down from 42. Extubated yesterday. Received a total of 6 units of PRBCs since admission, including 1 unit infusing now, 3 units of FFP and platelet pheresis infusion x 1. -agree with transfusion 1 unit  of PRBCs  -clear liquids -continue IV PPI drip  -continue to monitor for active GI bleeding -will need eventual repeat EGD to reassess the duodenum  2. ? Cirrhosis per abdominal sonogram. No EV or evidence of portal gastropathy per EGD. Hep B and C serologies negative. ANA negative. PLT 106. ? Etoh. Will need eventual liver CT or MRI once renal status appropriate. -will need cirrhosis evaluation as outpatient   3. Macrocytic anemia with Vitamin B12 deficiency. Hg 7.0. Currently receiving 1 unit of PRBCs.  4. AKI. Cr. 2.14 down from 2.19. GFR 28.  CRT in process. Followed by Nephrology.   5. Lytic lesions found on xray, ? Myeloma.   6. RUE DVT not on anticoagulation   7. Coagulopathy  LOS: 9 days   Noralyn Pick  01/23/2019, 9:30 AM

## 2019-01-23 NOTE — Progress Notes (Signed)
Patient ID: Scott Mcdowell, male   DOB: 12/26/1940, 78 y.o.   MRN: [id number]  [facility] Progress Note   Assessment/ Plan:   1. 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 Earlie Server.  He does not have any evidence of renal recovery and I will continue him on CRRT at least for the next 24 hours for volume unloading/clearance prior to deciding transition to intermittent hemodialysis (based on ability to wean off of pressors/labs/blood pressure).  Hyperkalemia corrected. 2.  Volume overload: Improving with CRRT 3.  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 4.  Right arm brachial vein DVT: Previously on heparin drip that was discontinued after he developed GI bleed. 5.  Hypotension/shock: Weaning pressors at this time. 6.  Vitamin D deficiency: Continue cholecalciferol supplementation when stabilized 7.  Upper GI bleed: Endoscopy results reviewed-bleeding noted to be from duodenal bulb region that was treated endoscopically.  Subjective:   Extubated yesterday and has done well overnight.   Objective:   BP (!) 101/50   Pulse 88   Temp (!) 97.5 F (36.4 C) (Oral)   Resp 18   Ht '6\' 2"'  (1.88 m)   Wt 108.3 kg   SpO2 100%   BMI 30.65 kg/m   Intake/Output Summary (Last 24 hours) at 01/23/2019 0746 Last data filed at 01/23/2019 0700 Gross per 24 hour  Intake 1119.27 ml  Output 3245 ml  Net -2125.73 ml   Weight change: -2.4 kg  Physical Exam: Gen: Awake/alert, on CRRT CVS: Pulse regular rhythm, normal rate, S1-S2 normal Resp: Anteriorly clear to  auscultation, no rales Abd: Soft, obese, nontender Ext: Trace-1+ lower extremity edema, 2+ right upper extremity edema, 1+ left upper extremity edema  Imaging: Dg Chest Port 1 View  Result Date: 01/23/2019 CLINICAL DATA:  Acute respiratory failure EXAM: PORTABLE CHEST 1 VIEW COMPARISON:  Chest radiograph 01/22/2019 FINDINGS: Central venous catheter tip projects over the superior vena cava. Dual lumen central venous catheter tip projects over the superior vena cava. Monitoring leads overlie the patient. Stable cardiomegaly. Aortic atherosclerosis. Interval extubation. Low lung volumes. Layering right pleural effusion. Diffuse bilateral interstitial opacities, mildly increased, right greater than left. No pneumothorax. IMPRESSION: Interval extubation. Persistent layering right pleural effusion. Mild increased bilateral airspace opacities, right greater than left. Layering right pleural effusion. Electronically Signed   By: Lovey Newcomer M.D.   On: 01/23/2019 05:42   Portable Chest Xray  Result Date: 01/22/2019 CLINICAL DATA:  Endotracheal tube present. EXAM: PORTABLE CHEST 1 VIEW COMPARISON:  Radiograph January 21, 2019. FINDINGS: Stable cardiomegaly. Endotracheal tube is in good position. Right internal jugular dialysis catheter is unchanged. Left internal jugular catheter is unchanged. No pneumothorax is noted. Left lung is clear. Stable right basilar atelectasis or infiltrate is noted with associated pleural effusion. Bony thorax is unremarkable. IMPRESSION: Stable support apparatus. Stable right basilar opacity as described above. Aortic Atherosclerosis (ICD10-I70.0). Electronically Signed   By: Marijo Conception M.D.   On: 01/22/2019 07:48   Dg Chest Port 1 View  Result Date: 01/21/2019 CLINICAL DATA:  Encounter for central line placement. EXAM: PORTABLE CHEST 1 VIEW COMPARISON:  Radiograph of January 21, 2019. FINDINGS: Mild cardiomegaly  is noted. Endotracheal tube is in grossly good position. Nasogastric tube  has been removed. No pneumothorax is noted. Stable position of right internal jugular dialysis catheter with distal tip in expected position of right atrium. Interval placement of left internal jugular catheter with distal tip in expected position of the SVC. Left lung is clear. Mild right basilar atelectasis and small pleural effusion is noted. Bony thorax is unremarkable. IMPRESSION: Interval placement of left internal jugular catheter with distal tip in expected position of SVC. Nasogastric tube has been removed. Otherwise stable support apparatus. No pneumothorax is noted. Mild right basilar atelectasis and effusion is noted. Electronically Signed   By: Marijo Conception M.D.   On: 01/21/2019 16:21   Portable Chest X-ray  Result Date: 01/21/2019 CLINICAL DATA:  Endotracheal tube EXAM: PORTABLE CHEST 1 VIEW COMPARISON:  Yesterday FINDINGS: Endotracheal tube tip between the clavicular heads and carina. Orogastric tube reaches the stomach. Dialysis catheter with tip at the upper cavoatrial junction. Cardiomegaly, stable. Haziness of the right more than left lower chest attributed atelectasis and pleural fluid. Right lateral fifth rib lucent lesion that appears aggressive on prior imaging. These results were called by telephone at the time of interpretation on 01/21/2019 at 11:05 am to Dr. Noe Gens , who verbally acknowledged these results. IMPRESSION: 1. New endotracheal and orogastric tubes in expected position. Mildly improved lung volumes. 2. Lytic lesion in the lateral right fifth rib needing workup for malignancy Electronically Signed   By: Monte Fantasia M.D.   On: 01/21/2019 11:06    Labs: BMET Recent Labs  Lab 01/19/19 1029 01/20/19 0133 01/21/19 0959 01/21/19 1702 01/22/19 0317 01/22/19 1600 01/23/19 0500  NA 133* 131* 136 137 134* 135 136  K 5.5* 5.7* 5.5* 4.9 4.9 4.5 4.8  CL 104 102 104 107 100 104 103  CO2 18* 19* 20* 21* '23 22 24  ' GLUCOSE 130* 102* 107* 104* 125* 101* 105*  BUN  103* 109* 102* 87* 66* 42* 37*  CREATININE 3.98* 4.29* 3.54* 2.88* 2.74* 2.19* 2.14*  CALCIUM 10.5* 10.5* 9.3 8.8* 8.8* 8.6* 9.1  PHOS  --  6.8*  --   --  3.8 3.3 3.2   CBC Recent Labs  Lab 01/20/19 2021  01/21/19 0959 01/21/19 1702 01/22/19 0317 01/23/19 0500  WBC 13.4*  --  12.2* 13.1* 13.3* 10.7*  NEUTROABS 8.0*  --  7.8* 8.0* 8.5*  --   HGB 5.1*   < > 6.8* 7.7* 7.6* 7.0*  HCT 16.4*   < > 21.2* 23.1* 22.7* 21.7*  MCV 105.8*  --  100.5* 98.3 98.7 101.4*  PLT 102*  --  89* 94* 122* 106*   < > = values in this interval not displayed.   Medications:    . chlorhexidine gluconate (MEDLINE KIT)  15 mL Mouth Rinse BID  . Chlorhexidine Gluconate Cloth  6 each Topical Daily  . mouth rinse  15 mL Mouth Rinse q12n4p  . midodrine  5 mg Oral TID WC  . nystatin   Topical TID  . [START ON 01/24/2019] pantoprazole  40 mg Intravenous Q12H   Elmarie Shiley, MD 01/23/2019, 7:46 AM

## 2019-01-24 DIAGNOSIS — N170 Acute kidney failure with tubular necrosis: Principal | ICD-10-CM

## 2019-01-24 DIAGNOSIS — D62 Acute posthemorrhagic anemia: Secondary | ICD-10-CM

## 2019-01-24 LAB — RENAL FUNCTION PANEL
Albumin: 3.7 g/dL (ref 3.5–5.0)
Albumin: 3.7 g/dL (ref 3.5–5.0)
Anion gap: 7 (ref 5–15)
Anion gap: 7 (ref 5–15)
BUN: 23 mg/dL (ref 8–23)
BUN: 20 mg/dL (ref 8–23)
CO2: 25 mmol/L (ref 22–32)
CO2: 26 mmol/L (ref 22–32)
Calcium: 8.9 mg/dL (ref 8.9–10.3)
Calcium: 9 mg/dL (ref 8.9–10.3)
Chloride: 103 mmol/L (ref 98–111)
Chloride: 101 mmol/L (ref 98–111)
Creatinine, Ser: 1.9 mg/dL — ABNORMAL HIGH (ref 0.61–1.24)
Creatinine, Ser: 2.03 mg/dL — ABNORMAL HIGH (ref 0.61–1.24)
GFR calc Af Amer: 38 mL/min — ABNORMAL LOW (ref >60)
GFR calc Af Amer: 35 mL/min — ABNORMAL LOW (ref >60)
GFR calc non Af Amer: 33 mL/min — ABNORMAL LOW (ref >60)
GFR calc non Af Amer: 30 mL/min — ABNORMAL LOW (ref >60)
Glucose, Bld: 106 mg/dL — ABNORMAL HIGH (ref 70–99)
Glucose, Bld: 133 mg/dL — ABNORMAL HIGH (ref 70–99)
Phosphorus: 2.3 mg/dL — ABNORMAL LOW (ref 2.5–4.6)
Phosphorus: 2.5 mg/dL (ref 2.5–4.6)
Potassium: 4.5 mmol/L (ref 3.5–5.1)
Potassium: 4.9 mmol/L (ref 3.5–5.1)
Sodium: 135 mmol/L (ref 135–145)
Sodium: 134 mmol/L — ABNORMAL LOW (ref 135–145)

## 2019-01-24 LAB — MAGNESIUM: Magnesium: 2.7 mg/dL — ABNORMAL HIGH (ref 1.7–2.4)

## 2019-01-24 LAB — GLUCOSE, CAPILLARY: Glucose-Capillary: 113 mg/dL — ABNORMAL HIGH (ref 70–99)

## 2019-01-24 LAB — CBC
HCT: 23.5 % — ABNORMAL LOW (ref 39.0–52.0)
Hemoglobin: 7.6 g/dL — ABNORMAL LOW (ref 13.0–17.0)
MCH: 32.5 pg (ref 26.0–34.0)
MCHC: 32.3 g/dL (ref 30.0–36.0)
MCV: 100.4 fL — ABNORMAL HIGH (ref 80.0–100.0)
Platelets: 103 10*3/uL — ABNORMAL LOW (ref 150–400)
RBC: 2.34 MIL/uL — ABNORMAL LOW (ref 4.22–5.81)
RDW: 16.6 % — ABNORMAL HIGH (ref 11.5–15.5)
WBC: 10.3 10*3/uL (ref 4.0–10.5)
nRBC: 5.5 % — ABNORMAL HIGH (ref 0.0–0.2)

## 2019-01-24 LAB — CORTISOL: Cortisol, Plasma: 18.4 ug/dL

## 2019-01-24 MED ORDER — MIDODRINE HCL 5 MG PO TABS
10.0000 mg | ORAL_TABLET | Freq: Three times a day (TID) | ORAL | Status: DC
  Administered 2019-01-24 – 2019-02-10 (×49): 10 mg via ORAL
  Filled 2019-01-24 (×49): qty 2

## 2019-01-24 NOTE — Consult Note (Signed)
Chief Complaint: Patient was seen in consultation today for Bone marrow biopsy   Referring Physician(s): Dr Halford Chessman  Supervising Physician: Jacqulynn Cadet  Patient Status: [facility] - In-pt  History of Present Illness: Scott Mcdowell is a 78 y.o. 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 PCCM intubating patient and he will be ready for EGD by 10: 30-10:40am   HEMOSTASIS CLIP PLACEMENT  01/21/2019   Procedure: HEMOSTASIS CLIP PLACEMENT;  Surgeon: Yetta Flock, MD;  Location: [facility] ENDOSCOPY;  Service: Gastroenterology;;   HEMOSTASIS CONTROL  01/21/2019   Procedure: HEMOSTASIS CONTROL;  Surgeon: Yetta Flock, MD;  Location: [facility];  Service: Gastroenterology;;  Girtha Rm Probe   IR FLUORO GUIDE CV LINE RIGHT  01/20/2019   IR US GUIDE VASC ACCESS RIGHT  01/20/2019   SCLEROTHERAPY  01/21/2019   Procedure: Clide Deutscher;  Surgeon: Yetta Flock, MD;  Location: [facility] ENDOSCOPY;  Service: Gastroenterology;;    Allergies: Patient has no known allergies.  Medications: Prior to Admission medications   Medication Sig Start Date End Date Taking? Authorizing Provider  aspirin 81 MG chewable tablet Chew 81 mg by mouth daily.   Yes [provider]  atorvastatin (LIPITOR) 10 MG tablet Take 10 mg by mouth daily. 11/12/18  Yes [provider]  ramipril (ALTACE) 2.5 MG capsule Take 2.5 mg by mouth daily. 11/12/18  Yes [provider]     Family History  Problem  Relation Age of Onset   Cancer Mother    Parkinson's disease Father     Social History   Socioeconomic History   Marital status: Married    Spouse name: Not on file   Number of children: Not on file   Years of education: Not on file   Highest education level: Not on file  Occupational History   Not on file  Social Needs   Financial resource strain: Not on file   Food insecurity:    Worry: Not on file    Inability: Not on file   Transportation needs:    Medical: Not on file    Non-medical: Not on file  Tobacco Use   Smoking status: Former Smoker    Types: Cigarettes   Smokeless tobacco: Never Used  Substance and Sexual Activity   Alcohol use: Yes    Alcohol/week: 5.0 standard drinks    Types: 5 Cans of beer per week   Drug use: Not on file   Sexual activity: Not on file  Lifestyle   Physical activity:    Days per week: Not on file    Minutes per session: Not on file   Stress: Not on file  Relationships   Social connections:    Talks on phone: Not on file    Gets together: Not on file    Attends religious service: Not on file    Active member of club or organization: Not on file    Attends meetings of clubs or organizations: Not on file    Relationship status: Not on file  Other Topics Concern   Not on file  Social History Narrative   Not on file    Review of Systems: A 12 point ROS discussed and pertinent positives are indicated in the HPI above.  All other systems are negative.  Review of Systems  Constitutional: Positive for activity change and fatigue.  Respiratory: Positive for shortness of breath.   Musculoskeletal: Positive for gait problem.  Neurological: Positive for weakness.  Psychiatric/Behavioral: Negative for behavioral problems and confusion.    Vital Signs: BP 97/60    Pulse 85    Temp 98.1 F (36.7 C) (Oral)    Resp 19    Ht '6\' 2"'  (1.88 m)    Wt 240 lb 1.3 oz (108.9 kg)    SpO2 100%    BMI 30.82 kg/m   Physical  Exam Vitals signs reviewed.  Cardiovascular:     Rate and Rhythm: Normal rate and regular rhythm.  Pulmonary:     Breath sounds: Wheezing present.  Musculoskeletal: Normal range of motion.     Comments: Rt arm weakness  Skin:    General: Skin is warm and dry.  Neurological:     Mental Status: He is alert and oriented to person, place, and time.  Psychiatric:        Mood and Affect: Mood normal.        Behavior: Behavior normal.        Thought Content: Thought content normal.        Judgment: Judgment normal.     Imaging: Dg Chest 1 View  Result Date: 01/16/2019 CLINICAL DATA:  Post thoracentesis EXAM: CHEST  1 VIEW COMPARISON:  01/14/2019 FINDINGS: Normal heart size. Low volumes. Bibasilar atelectasis. There is no pneumothorax post right thoracentesis. IMPRESSION: No pneumothorax post right thoracentesis. Electronically Signed   By: Marybelle Killings M.D.   On: 01/16/2019 14:42   Dg Abd 1 View  Result Date: 01/20/2019 CLINICAL DATA:  NG tube positioning EXAM: ABDOMEN - 1 VIEW COMPARISON:  None. FINDINGS: The NG tube tip projects over the gastric antrum. The tip is pointed distally. The bowel gas pattern is nonspecific and nonobstructive. IMPRESSION: NG tube tip projects over the gastric antrum. The tip is pointed distally. Electronically Signed   By: Constance Holster M.D.   On: 01/20/2019 22:37   US Abdomen Complete  Result Date: 01/17/2019 CLINICAL DATA:  Chronic kidney disease EXAM: ABDOMEN ULTRASOUND COMPLETE COMPARISON:  CT 01/14/2019 FINDINGS: Gallbladder: Gallbladder wall is mildly thickened at 5 mm. No visible stones or sonographic Murphy's sign. Common bile duct: Diameter: Normal caliber, 3 mm Liver: Coarsened echotexture and nodular contours of the liver suggests cirrhosis. No focal hepatic abnormality. Portal vein is patent on color Doppler imaging with normal direction of blood flow towards the liver. IVC: Prominent IVC and and hepatic veins which can be seen with right heart  failure. Pancreas: Visualized portion unremarkable. Spleen: Size and appearance within normal limits. Right Kidney: Length: 12.3 cm. Echogenicity within normal limits. No mass or hydronephrosis. Left Kidney: Length: 11.5 cm. Echogenicity within normal limits. No mass or hydronephrosis. Abdominal aorta: No aneurysm visualized. Other findings: Ascites noted around the liver and spleen. IMPRESSION: Heterogeneous echotexture throughout the liver with subtle nodularity of the contours suggesting cirrhosis. Gallbladder wall thickening. Burtis Junes this is related to liver disease. Perihepatic and perisplenic ascites. Mildly dilated IVC and hepatic veins can be seen with right heart failure. Electronically Signed   By: Rolm Baptise M.D.   On: 01/17/2019 19:25   Ir Fluoro  Guide Cv Line Right  Result Date: 01/20/2019 INDICATION: Renal insufficiency. In need of intravenous access for the initiation of dialysis. EXAM: NON-TUNNELED CENTRAL VENOUS HEMODIALYSIS CATHETER PLACEMENT WITH ULTRASOUND AND FLUOROSCOPIC GUIDANCE COMPARISON:  Chest CT-01/14/2019 MEDICATIONS: None FLUOROSCOPY TIME:  24 seconds (10 mGy) COMPLICATIONS: None immediate. PROCEDURE: Informed written consent was obtained from the patient after a discussion of the risks, benefits, and alternatives to treatment. Questions regarding the procedure were encouraged and answered. The right neck and chest were prepped with chlorhexidine in a sterile fashion, and a sterile drape was applied covering the operative field. Maximum barrier sterile technique with sterile gowns and gloves were used for the procedure. A timeout was performed prior to the initiation of the procedure. After the overlying soft tissues were anesthetized, a small venotomy incision was created and a micropuncture kit was utilized to access the internal jugular vein. Real-time ultrasound guidance was utilized for vascular access including the acquisition of a permanent ultrasound image documenting patency  of the accessed vessel. The microwire was utilized to measure appropriate catheter length. A stiff glidewire was advanced to the level of the IVC. Under fluoroscopic guidance, the venotomy was serially dilated, ultimately allowing placement of a 20 cm temporary Trialysis catheter with tip ultimately terminating within the superior aspect of the right atrium. Final catheter positioning was confirmed and documented with a spot radiographic image. The catheter aspirates and flushes normally. The catheter was flushed with appropriate volume heparin dwells. The catheter exit site was secured with a 0-Prolene retention suture. A dressing was placed. The patient tolerated the procedure well without immediate post procedural complication. IMPRESSION: Successful placement of a right internal jugular approach 20 cm temporary dialysis catheter with tip terminating with in the superior aspect of the right atrium. The catheter is ready for immediate use. PLAN: This catheter may be converted to a tunneled dialysis catheter at a later date as indicated. Electronically Signed   By: Sandi Mariscal M.D.   On: 01/20/2019 17:24   Ir US Guide Vasc Access Right  Result Date: 01/21/2019 INDICATION: Renal insufficiency. In need of intravenous access for the initiation of dialysis. EXAM: NON-TUNNELED CENTRAL VENOUS HEMODIALYSIS CATHETER PLACEMENT WITH ULTRASOUND AND FLUOROSCOPIC GUIDANCE COMPARISON:  Chest CT-01/14/2019 MEDICATIONS: None FLUOROSCOPY TIME:  24 seconds (10 mGy) COMPLICATIONS: None immediate. PROCEDURE: Informed written consent was obtained from the patient after a discussion of the risks, benefits, and alternatives to treatment. Questions regarding the procedure were encouraged and answered. The right neck and chest were prepped with chlorhexidine in a sterile fashion, and a sterile drape was applied covering the operative field. Maximum barrier sterile technique with sterile gowns and gloves were used for the procedure. A  timeout was performed prior to the initiation of the procedure. After the overlying soft tissues were anesthetized, a small venotomy incision was created and a micropuncture kit was utilized to access the internal jugular vein. Real-time ultrasound guidance was utilized for vascular access including the acquisition of a permanent ultrasound image documenting patency of the accessed vessel. The microwire was utilized to measure appropriate catheter length. A stiff glidewire was advanced to the level of the IVC. Under fluoroscopic guidance, the venotomy was serially dilated, ultimately allowing placement of a 20 cm temporary Trialysis catheter with tip ultimately terminating within the superior aspect of the right atrium. Final catheter positioning was confirmed and documented with a spot radiographic image. The catheter aspirates and flushes normally. The catheter was flushed with appropriate volume heparin dwells. The catheter exit site was secured  with a 0-Prolene retention suture. A dressing was placed. The patient tolerated the procedure well without immediate post procedural complication. IMPRESSION: Successful placement of a right internal jugular approach 20 cm temporary dialysis catheter with tip terminating with in the superior aspect of the right atrium. The catheter is ready for immediate use. PLAN: This catheter may be converted to a tunneled dialysis catheter at a later date as indicated. Electronically Signed   By: Sandi Mariscal M.D.   On: 01/20/2019 17:24   Dg Chest Port 1 View  Result Date: 01/23/2019 CLINICAL DATA:  Acute respiratory failure EXAM: PORTABLE CHEST 1 VIEW COMPARISON:  Chest radiograph 01/22/2019 FINDINGS: Central venous catheter tip projects over the superior vena cava. Dual lumen central venous catheter tip projects over the superior vena cava. Monitoring leads overlie the patient. Stable cardiomegaly. Aortic atherosclerosis. Interval extubation. Low lung volumes. Layering right  pleural effusion. Diffuse bilateral interstitial opacities, mildly increased, right greater than left. No pneumothorax. IMPRESSION: Interval extubation. Persistent layering right pleural effusion. Mild increased bilateral airspace opacities, right greater than left. Layering right pleural effusion. Electronically Signed   By: Lovey Newcomer M.D.   On: 01/23/2019 05:42   Portable Chest Xray  Result Date: 01/22/2019 CLINICAL DATA:  Endotracheal tube present. EXAM: PORTABLE CHEST 1 VIEW COMPARISON:  Radiograph January 21, 2019. FINDINGS: Stable cardiomegaly. Endotracheal tube is in good position. Right internal jugular dialysis catheter is unchanged. Left internal jugular catheter is unchanged. No pneumothorax is noted. Left lung is clear. Stable right basilar atelectasis or infiltrate is noted with associated pleural effusion. Bony thorax is unremarkable. IMPRESSION: Stable support apparatus. Stable right basilar opacity as described above. Aortic Atherosclerosis (ICD10-I70.0). Electronically Signed   By: Marijo Conception M.D.   On: 01/22/2019 07:48   Dg Chest Port 1 View  Result Date: 01/21/2019 CLINICAL DATA:  Encounter for central line placement. EXAM: PORTABLE CHEST 1 VIEW COMPARISON:  Radiograph of January 21, 2019. FINDINGS: Mild cardiomegaly is noted. Endotracheal tube is in grossly good position. Nasogastric tube has been removed. No pneumothorax is noted. Stable position of right internal jugular dialysis catheter with distal tip in expected position of right atrium. Interval placement of left internal jugular catheter with distal tip in expected position of the SVC. Left lung is clear. Mild right basilar atelectasis and small pleural effusion is noted. Bony thorax is unremarkable. IMPRESSION: Interval placement of left internal jugular catheter with distal tip in expected position of SVC. Nasogastric tube has been removed. Otherwise stable support apparatus. No pneumothorax is noted. Mild right basilar atelectasis  and effusion is noted. Electronically Signed   By: Marijo Conception M.D.   On: 01/21/2019 16:21   Portable Chest X-ray  Result Date: 01/21/2019 CLINICAL DATA:  Endotracheal tube EXAM: PORTABLE CHEST 1 VIEW COMPARISON:  Yesterday FINDINGS: Endotracheal tube tip between the clavicular heads and carina. Orogastric tube reaches the stomach. Dialysis catheter with tip at the upper cavoatrial junction. Cardiomegaly, stable. Haziness of the right more than left lower chest attributed atelectasis and pleural fluid. Right lateral fifth rib lucent lesion that appears aggressive on prior imaging. These results were called by telephone at the time of interpretation on 01/21/2019 at 11:05 am to Dr. Noe Gens , who verbally acknowledged these results. IMPRESSION: 1. New endotracheal and orogastric tubes in expected position. Mildly improved lung volumes. 2. Lytic lesion in the lateral right fifth rib needing workup for malignancy Electronically Signed   By: Monte Fantasia M.D.   On: 01/21/2019 11:06  Dg Chest Port 1 View  Result Date: 01/20/2019 CLINICAL DATA:  Central line placement EXAM: PORTABLE CHEST 1 VIEW COMPARISON:  01/16/2019 FINDINGS: There is a well-positioned non tunneled right-sided central venous catheter with tip projecting over the right atrium. The cardiac silhouette is enlarged. There is no pneumothorax. There is a moderate sized right-sided pleural effusion. There is a small left-sided pleural effusion. There is generalized volume overload with evidence of pulmonary edema. IMPRESSION: 1. Well-positioned right-sided dialysis catheter.  No pneumothorax. 2. Cardiomegaly with volume overload including small to moderate-sized bilateral pleural effusions. Electronically Signed   By: Constance Holster M.D.   On: 01/20/2019 20:15   Vas [place] Upper Extremity Venous Duplex  Result Date: 01/19/2019 UPPER VENOUS STUDY  Indications: Swelling Performing Technologist: Maudry Mayhew MHA, RDMS, RVT, RDCS   Examination Guidelines: A complete evaluation includes B-mode imaging, spectral Doppler, color Doppler, and power Doppler as needed of all accessible portions of each vessel. Bilateral testing is considered an integral part of a complete examination. Limited examinations for reoccurring indications may be performed as noted.  Right Findings: +----------+------------+---------+-----------+----------+-------+  RIGHT      Compressible Phasicity Spontaneous Properties Summary  +----------+------------+---------+-----------+----------+-------+  IJV            Full        Yes        Yes                         +----------+------------+---------+-----------+----------+-------+  Subclavian     Full        Yes        Yes                         +----------+------------+---------+-----------+----------+-------+  Axillary       Full        Yes        Yes                         +----------+------------+---------+-----------+----------+-------+  Brachial       None                   No                  Acute   +----------+------------+---------+-----------+----------+-------+  Radial         Full                                               +----------+------------+---------+-----------+----------+-------+  Ulnar          Full                                               +----------+------------+---------+-----------+----------+-------+  Cephalic       Full                                               +----------+------------+---------+-----------+----------+-------+  Basilic        Full                                               +----------+------------+---------+-----------+----------+-------+  Left Findings: +----------+------------+---------+-----------+----------+-------+  LEFT       Compressible Phasicity Spontaneous Properties Summary  +----------+------------+---------+-----------+----------+-------+  Subclavian                 Yes        Yes                          +----------+------------+---------+-----------+----------+-------+  Summary:  Right: No evidence of superficial vein thrombosis in the upper extremity. Findings consistent with acute deep vein thrombosis involving the right brachial veins.  Left: No evidence of thrombosis in the subclavian.  *See table(s) above for measurements and observations.  Diagnosing physician: Deitra Mayo MD Electronically signed by Deitra Mayo MD on 01/19/2019 at 4:52:34 PM.    Final    US Thoracentesis Asp Pleural Space W/img Guide  Result Date: 01/16/2019 INDICATION: Shortness of breath. Possible right lower lobe pneumonia. Right pleural effusion. Request for diagnostic and therapeutic thoracentesis. EXAM: ULTRASOUND GUIDED RIGHT THORACENTESIS MEDICATIONS: 1% lidocaine 10 mL COMPLICATIONS: None immediate. PROCEDURE: An ultrasound guided thoracentesis was thoroughly discussed with the patient and questions answered. The benefits, risks, alternatives and complications were also discussed. The patient understands and wishes to proceed with the procedure. Written consent was obtained. Ultrasound was performed to localize and mark an adequate pocket of fluid in the right chest. The area was then prepped and draped in the normal sterile fashion. 1% Lidocaine was used for local anesthesia. Under ultrasound guidance a 6 Fr Safe-T-Centesis catheter was introduced. Thoracentesis was performed. The catheter was removed and a dressing applied. FINDINGS: A total of approximately 600 mL of clear yellow fluid was removed. Samples were sent to the laboratory as requested by the clinical team. IMPRESSION: Successful ultrasound guided right thoracentesis yielding 600 mL of pleural fluid. No pneumothorax on post-procedure chest x-ray. Read by: Gareth Eagle, PA-C Electronically Signed   By: Jerilynn Mages.  Shick M.D.   On: 01/16/2019 14:54    Labs:  CBC: Recent Labs    01/21/19 1702 01/22/19 0317 01/23/19 0500 01/23/19 1600 01/24/19 0500   WBC 13.1* 13.3* 10.7*  --  10.3  HGB 7.7* 7.6* 7.0* 7.6* 7.6*  HCT 23.1* 22.7* 21.7* 23.8* 23.5*  PLT 94* 122* 106*  --  103*    COAGS: Recent Labs    01/20/19 2021 01/21/19 0959  INR 1.6* 1.3*  APTT 76*  --     BMP: Recent Labs    01/22/19 1600 01/23/19 0500 01/23/19 1600 01/24/19 0500  NA 135 136 134* 135  K 4.5 4.8 4.7 4.5  CL 104 103 102 103  CO2 '22 24 25 25  ' GLUCOSE 101* 105* 138* 106*  BUN 42* 37* 28* 23  CALCIUM 8.6* 9.1 9.0 8.9  CREATININE 2.19* 2.14* 2.02* 1.90*  GFRNONAA 28* 28* 30* 33*  GFRAA 32* 33* 35* 38*    LIVER FUNCTION TESTS: Recent Labs    01/15/19 0134 01/16/19 0956 01/17/19 0434 01/18/19 0459  01/22/19 1600 01/23/19 0500 01/23/19 1600 01/24/19 0500  BILITOT 1.6* 1.2 0.9 1.0  --   --   --   --   --   AST '23 25 23 26  ' --   --   --   --   --   ALT '23 23 25 25  ' --   --   --   --   --   ALKPHOS 63 59 59 60  --   --   --   --   --  PROT 6.4* 6.4* 6.2* 6.1*  --   --   --   --   --   ALBUMIN 4.1 3.9 3.7 3.7   < > 3.5 3.7 3.7 3.7   < > = values in this interval not displayed.    TUMOR MARKERS: No results for input(s): AFPTM, CEA, CA199, CHROMGRNA in the last 8760 hours.  Assessment and Plan:  Anemia Thrombocytopenia Imaging concerning for MM Scheduled for bone marrow biopsy in Radiology 6/8 Risks and benefits of bone marrow biopsy was discussed with the patient and/or patient's family including, but not limited to bleeding, infection, damage to adjacent structures or low yield requiring additional tests.  All of the questions were answered and there is agreement to proceed.  Consent signed and in chart.   Thank you for this interesting consult.  I greatly enjoyed meeting Scott Mcdowell and look forward to participating in their care.  A copy of this report was sent to the requesting provider on this date.  Electronically Signed: Lavonia Drafts, PA-C 01/24/2019, 11:06 AM   I spent a total of 40 Minutes    in face to face  in clinical consultation, greater than 50% of which was counseling/coordinating care for BM bx

## 2019-01-24 NOTE — Progress Notes (Signed)
[facility] Progress Note  CC:  Upper GI Bleed   Subjective: He had some confusion during the night. This morning he is alert and no longer confused. No abdominal pain. Tolerating a clear liquid diet, ready to advance to a full diet. No N/V. No BM.  Objective:  Vital signs in last 24 hours: Temp:  [97.5 F (36.4 C)-98.1 F (36.7 C)] 98.1 F (36.7 C) (06/07 0900) Pulse Rate:  [68-93] 82 (06/07 0900) Resp:  [13-23] 17 (06/07 0900) BP: (76-135)/(26-62) 99/57 (06/07 0900) SpO2:  [91 %-100 %] 100 % (06/07 0900) Weight:  [108.9 kg] 108.9 kg (06/07 0500) Last BM Date: 01/22/19 General:   Alert, sitting up in bed, in NAD. Talkative today. Heart: RRR, systolic murmur. Pulm: Lungs clear throughout, slightly diminished in the bases. Abdomen: Mildly protuberant, soft, nontender, + BS x 4 quads. Extremities:  Bilateral LEs 2+ pitting edema, bilateral UEs 1+ edema with moderate ecchymosis.  Neurologic:  Alert and  oriented x4;  grossly normal neurologically. Psych:  Alert and cooperative. Normal mood and affect.  Intake/Output from previous day: 06/06 0701 - 06/07 0700 In: 1898.1 [P.O.:798; I.V.:775.6; Blood:324.6] Out: 3973 [Urine:15] Intake/Output this shift: Total I/O In: 46.2 [P.O.:10; I.V.:36.2] Out: 230 [Other:230]  Lab Results: Recent Labs    01/22/19 0317 01/23/19 0500 01/23/19 1600 01/24/19 0500  WBC 13.3* 10.7*  --  10.3  HGB 7.6* 7.0* 7.6* 7.6*  HCT 22.7* 21.7* 23.8* 23.5*  PLT 122* 106*  --  103*   BMET Recent Labs    01/23/19 0500 01/23/19 1600 01/24/19 0500  NA 136 134* 135  K 4.8 4.7 4.5  CL 103 102 103  CO2 24 25 25   GLUCOSE 105* 138* 106*  BUN 37* 28* 23  CREATININE 2.14* 2.02* 1.90*  CALCIUM 9.1 9.0 8.9   LFT Recent Labs    01/24/19 0500  ALBUMIN 3.7   PT/INR Recent Labs    01/21/19 0959  LABPROT 16.5*  INR 1.3*   Hepatitis Panel No results for input(s): HEPBSAG, HCVAB, HEPAIGM, HEPBIGM in the last 72 hours.  Dg  Chest Port 1 View  Result Date: 01/23/2019 CLINICAL DATA:  Acute respiratory failure EXAM: PORTABLE CHEST 1 VIEW COMPARISON:  Chest radiograph 01/22/2019 FINDINGS: Central venous catheter tip projects over the superior vena cava. Dual lumen central venous catheter tip projects over the superior vena cava. Monitoring leads overlie the patient. Stable cardiomegaly. Aortic atherosclerosis. Interval extubation. Low lung volumes. Layering right pleural effusion. Diffuse bilateral interstitial opacities, mildly increased, right greater than left. No pneumothorax. IMPRESSION: Interval extubation. Persistent layering right pleural effusion. Mild increased bilateral airspace opacities, right greater than left. Layering right pleural effusion. Electronically Signed   By: Lovey Newcomer M.D.   On: 01/23/2019 05:42    Assessment / Plan:  1. 78 y.o. male with  Acute UGI Bleed. S/P EGD 6/4 which showed duodenal bulbwithseveral areas of friable tissue with oozing. An actively oozing visible vessel was found in the area, four clips placed. There lesions are high risk for rebleeding in setting of coagulopathy. Hg 6/6 on admission, received a total of 6 units of PRBCs. Stable Hg 7.6 today. Remains in the ICU, extubated 6/5, still on Levophed.  -advance to full liquid diet -continue Protonix  IV Q 12 hrs -continue to monitor for active GI bleeding -will need eventual repeat EGD to reassess the duodenum -transfuse for Hg < 7. -repeat H/H in am.  2. ? Cirrhosis per abdominal sonogram. No EV or evidence  of portal gastropathy per EGD. Hep B and C serologies negative. ANA negative. PLT 103. ? Etoh. Will need eventual liver CT or MRI once renal status appropriate. -will need cirrhosis evaluation as outpatient   3. Macrocytic anemia with Vitamin B12 deficiency. Hg 7.0. Currently receiving 1 unit of PRBCs.  4. AKI. Cr. 1.90 << 2.02.  CRRT in process. On Midodrine. Followed by Nephrology.   5. Lytic lesions found on  xray, ? Myeloma.   6. RUE DVT not on anticoagulation   7. Coagulopathy received 3 units of FFP  Further recommendations per Dr. Havery Moros      LOS: 10 days   Patrecia Pour [facility]  01/24/2019, 9:19 AM

## 2019-01-24 NOTE — Progress Notes (Signed)
NAME:  Scott Mcdowell, MRN:  [id number], DOB:  October 09, 1940, LOS: 11 ADMISSION DATE:  01/14/2019, CONSULTATION DATE:  01/20/2019 REFERRING MD:  Dr. Maudie Mercury, [facility] CHIEF COMPLAINT: GI bleeding  Brief History   78 y/o M, with CKD, admitted 5/28 with reports of SOB in the setting of suspected PNA and SOB.  While on the floor, he developed melena.  He was on heparin for RUE DVT. The patient was transferred to ICU 6/3 with hypotension.    PMH  CKD, HLD, HTN, CVA with Rt side weakness  Consults:  Nephrology IR GI  Procedures:  Tunneled HD 5/30 >> ETT 6/4 >> 6/5 Lt IJ CVL 6/5 >>  Significant Diagnostic Tests:  ECHO 5/29 >> LVEF 60-65%, RA moderately dilated Thoracentesis 5/30 >> glucose 122, protein < 3, LDH 122, WBC 217, cytology - reactive cells UE Venous Duplex 6/2 >> positive for acute R brachial vein DVT  EGD 6/4 >> adherent clot in distal gastric body, friable stomach mucosa, bleeding from duodenum with visible vessel tx with clips  Micro Data:  U. Strep 5/29 >> negative  COVID 5/29 >> negative RVP 5/30 >> negative Pleural Fluid 5/30 >> negative   BCx2 5/29 >> negative  BCx2 5/30 >> negative   Antimicrobials:  Azithro 5/29 >> 5/31  Vanco 5/29 >> 5/30 Cefepime 5/30 >> 6/4  Subjective:  Denies chest/abd pain.  Feels like he is slowly getting better.  Still on CRRT, pressors.  Objective   Blood pressure (!) 99/43, pulse 88, temperature 98 F (36.7 C), temperature source Oral, resp. rate 16, height _0  (1.88 m), weight 108.9 kg, SpO2 98 %. CVP:  [15 mmHg-17 mmHg] 15 mmHg      Intake/Output Summary (Last 24 hours) at 01/24/2019 0901 Last data filed at 01/24/2019 0800 Gross per 24 hour  Intake 1745.43 ml  Output 3748 ml  Net -2002.57 ml   Filed Weights   01/22/19 0424 01/23/19 0451 01/24/19 0500  Weight: 110.7 kg 108.3 kg 108.9 kg    Examination:  General - alert Eyes - pupils reactive ENT - no sinus tenderness, no stridor Cardiac - regular rate/rhythm, no  murmur Chest - equal breath sounds b/l, no wheezing or rales Abdomen - soft, non tender, + bowel sounds Extremities - decreased muscle bulk, 1+ edema Skin - no rashes Neuro - weak on Rt side, moves lt side, follows commands appropriately   Resolved Problems:  Hyperkalemia, Compromised airway  Assessment & Plan:   Hemorrhagic shock from Upper GI bleeding. 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 Plan - will need outpt GI follow up  Rt arm DVT. Plan - will need to check with GI when it is okay to resume anticoagulation  AKI from ATN. CKD 4. Plan - continue CRRT  Transudate pleural effusions. 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 Plan - Consider ONC evaluation once stabilized.  TRH had previously discussed elevated light chains with ONC but waiting on follow up labs before formal consult  - Send out labs requested by ONC pending    Best practice:  Diet: clear liquids DVT prophylaxis: SCDs GI prophylaxis: protonix Mobility: Bed rest Code Status: Full Code Disposition: ICU  Labs    CMP Latest Ref  Rng & Units 01/24/2019 01/23/2019 01/23/2019  Glucose 70 - 99 mg/dL 106(H) 138(H) 105(H)  BUN 8 - 23 mg/dL 23 28(H) 37(H)  Creatinine 0.61 - 1.24 mg/dL 1.90(H) 2.02(H) 2.14(H)  Sodium 135 - 145 mmol/L 135 134(L) 136  Potassium 3.5 - 5.1 mmol/L 4.5 4.7 4.8  Chloride 98 - 111 mmol/L 103 102 103  CO2 22 - 32 mmol/L _0 Calcium 8.9 - 10.3 mg/dL 8.9 9.0 9.1  Total Protein 6.5 - 8.1 g/dL - - -  Total Bilirubin 0.3 - 1.2 mg/dL - - -  Alkaline Phos 38 - 126 U/L - - -  AST 15 - 41 U/L - - -  ALT 0 - 44 U/L - - -   CBC Latest Ref Rng & Units 01/24/2019 01/23/2019 01/23/2019  WBC 4.0 - 10.5 K/uL 10.3 - 10.7(H)  Hemoglobin 13.0 -  17.0 g/dL 7.6(L) 7.6(L) 7.0(L)  Hematocrit 39.0 - 52.0 % 23.5(L) 23.8(L) 21.7(L)  Platelets 150 - 400 K/uL 103(L) - 106(L)   CBG (last 3)  Recent Labs    01/21/19 2351  GLUCAP 117*   ABG    Component Value Date/Time   PHART 7.414 01/22/2019 0839   PCO2ART 36.8 01/22/2019 0839   PO2ART 104 01/22/2019 0839   HCO3 23.1 01/22/2019 0839   ACIDBASEDEF 0.8 01/22/2019 0839   O2SAT 97.8 01/22/2019 0839   CC time 31 minutes  Chesley Mires, MD [place] 01/24/2019, 9:01 AM

## 2019-01-24 NOTE — Progress Notes (Signed)
Patient ID: Scott Mcdowell, male   DOB: 10-24-1940, 78 y.o.   MRN: [id number]  [facility] Progress Note   Assessment/ Plan:   1. 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 Earlie Server and bone marrow biopsy ordered by IR along with quantitative immunoglobulins.  He is anuric and I will continue him on CRRT until he is taken off tomorrow morning for bone marrow biopsy.  Thereafter, may need to reevaluate modality for RRT, on midodrine 10 mg 3 times daily. 2.  Volume overload: Improving with CRRT-increased pressor needs noted overnight.  Informed RN that we could decrease UF goal to 50 an hour if he continues to require higher pressor dose. 3.  Anemia/thrombocytopenia with recent acute blood loss anemia from GI bleed: Ongoing evaluation for plasma cell dyscrasia, some downtrend of hemoglobin/hematocrit noted overnight, improved status post PRBC. 4.  Right arm brachial vein DVT: Previously on heparin drip that was discontinued after he developed GI bleed. 5.  Hypotension/shock: Weaning pressors at this time. 6.  Vitamin D deficiency: Continue cholecalciferol supplementation when stabilized 7.  Upper GI bleed: Endoscopy results reviewed-bleeding noted to be from duodenal bulb region that was treated endoscopically.  Subjective:   Denies any acute complaints, tolerated CRRT with some up titration of pressors.   Objective:   BP 97/60   Pulse 85   Temp 98.1 F (36.7 C) (Oral)   Resp 19   Ht '6\' 2"'  (1.88 m)   Wt 108.9 kg   SpO2 100%   BMI 30.82 kg/m   Intake/Output Summary (Last 24 hours) at 01/24/2019 1123 Last data filed at 01/24/2019 1100 Gross per 24 hour  Intake 1959.47 ml  Output 4103 ml  Net -2143.53 ml   Weight change: 0.6 kg  Physical Exam: Gen:  Awake/alert, on CRRT CVS: Pulse regular rhythm, normal rate, S1-S2 normal Resp: Anteriorly clear to auscultation, no rales Abd: Soft, obese, nontender Ext: Trace-1+ lower extremity edema, 2+ right upper extremity edema, 1+ left upper extremity edema  Imaging: Dg Chest Port 1 View  Result Date: 01/23/2019 CLINICAL DATA:  Acute respiratory failure EXAM: PORTABLE CHEST 1 VIEW COMPARISON:  Chest radiograph 01/22/2019 FINDINGS: Central venous catheter tip projects over the superior vena cava. Dual lumen central venous catheter tip projects over the superior vena cava. Monitoring leads overlie the patient. Stable cardiomegaly. Aortic atherosclerosis. Interval extubation. Low lung volumes. Layering right pleural effusion. Diffuse bilateral interstitial opacities, mildly increased, right greater than left. No pneumothorax. IMPRESSION: Interval extubation. Persistent layering right pleural effusion. Mild increased bilateral airspace opacities, right greater than left. Layering right pleural effusion. Electronically Signed   By: Lovey Newcomer M.D.   On: 01/23/2019 05:42    Labs: BMET Recent Labs  Lab 01/20/19 0133 01/21/19 0959 01/21/19 1702 01/22/19 0317 01/22/19 1600 01/23/19 0500 01/23/19 1600 01/24/19 0500  NA 131* 136 137 134* 135 136 134* 135  K 5.7* 5.5* 4.9 4.9 4.5 4.8 4.7 4.5  CL 102 104 107 100 104 103 102 103  CO2 19* 20* 21* '23 22 24 25 25  ' GLUCOSE 102* 107* 104* 125* 101* 105* 138* 106*  BUN 109* 102* 87* 66* 42* 37* 28* 23  CREATININE 4.29* 3.54* 2.88* 2.74* 2.19* 2.14* 2.02* 1.90*  CALCIUM 10.5* 9.3 8.8* 8.8* 8.6* 9.1 9.0 8.9  PHOS 6.8*  --   --  3.8 3.3 3.2 2.6 2.3*   CBC Recent Labs  Lab 01/20/19 2021  01/21/19 0959 01/21/19 1702 01/22/19 0317 01/23/19 0500 01/23/19 1600 01/24/19 0500  WBC 13.4*  --  12.2* 13.1* 13.3* 10.7*  --  10.3  NEUTROABS 8.0*  --  7.8* 8.0* 8.5*  --   --   --   HGB 5.1*   < > 6.8* 7.7* 7.6* 7.0* 7.6* 7.6*  HCT 16.4*   < > 21.2* 23.1* 22.7*  21.7* 23.8* 23.5*  MCV 105.8*  --  100.5* 98.3 98.7 101.4*  --  100.4*  PLT 102*  --  89* 94* 122* 106*  --  103*   < > = values in this interval not displayed.   Medications:    . chlorhexidine gluconate (MEDLINE KIT)  15 mL Mouth Rinse BID  . Chlorhexidine Gluconate Cloth  6 each Topical Daily  . mouth rinse  15 mL Mouth Rinse q12n4p  . midodrine  10 mg Oral TID WC  . nystatin   Topical TID  . pantoprazole  40 mg Intravenous Q12H   Elmarie Shiley, MD 01/24/2019, 11:23 AM

## 2019-01-25 DIAGNOSIS — R932 Abnormal findings on diagnostic imaging of liver and biliary tract: Secondary | ICD-10-CM

## 2019-01-25 DIAGNOSIS — I959 Hypotension, unspecified: Secondary | ICD-10-CM

## 2019-01-25 DIAGNOSIS — K260 Acute duodenal ulcer with hemorrhage: Secondary | ICD-10-CM

## 2019-01-25 LAB — BPAM RBC
Blood Product Expiration Date: 202006292359
Blood Product Expiration Date: 202006292359
Blood Product Expiration Date: 202006292359
Blood Product Expiration Date: 202006292359
Blood Product Expiration Date: 202006292359
Blood Product Expiration Date: 202007012359
Blood Product Expiration Date: 202007012359
ISSUE DATE / TIME: 202006040409
ISSUE DATE / TIME: 202006040552
ISSUE DATE / TIME: 202006040742
ISSUE DATE / TIME: 202006041049
ISSUE DATE / TIME: 202006060816
ISSUE DATE / TIME: 202006080256
Unit Type and Rh: 5100
Unit Type and Rh: 5100
Unit Type and Rh: 5100
Unit Type and Rh: 5100
Unit Type and Rh: 5100
Unit Type and Rh: 5100
Unit Type and Rh: 5100

## 2019-01-25 LAB — TYPE AND SCREEN
ABO/RH(D): O POS
Antibody Screen: NEGATIVE
Unit division: 0
Unit division: 0
Unit division: 0
Unit division: 0
Unit division: 0
Unit division: 0
Unit division: 0

## 2019-01-25 LAB — RENAL FUNCTION PANEL
Albumin: 3.6 g/dL (ref 3.5–5.0)
Albumin: 3.5 g/dL (ref 3.5–5.0)
Anion gap: 8 (ref 5–15)
Anion gap: 8 (ref 5–15)
BUN: 17 mg/dL (ref 8–23)
BUN: 19 mg/dL (ref 8–23)
CO2: 26 mmol/L (ref 22–32)
CO2: 25 mmol/L (ref 22–32)
Calcium: 9.3 mg/dL (ref 8.9–10.3)
Calcium: 9.2 mg/dL (ref 8.9–10.3)
Chloride: 102 mmol/L (ref 98–111)
Chloride: 102 mmol/L (ref 98–111)
Creatinine, Ser: 1.93 mg/dL — ABNORMAL HIGH (ref 0.61–1.24)
Creatinine, Ser: 2.2 mg/dL — ABNORMAL HIGH (ref 0.61–1.24)
GFR calc Af Amer: 37 mL/min — ABNORMAL LOW (ref >60)
GFR calc Af Amer: 32 mL/min — ABNORMAL LOW (ref >60)
GFR calc non Af Amer: 32 mL/min — ABNORMAL LOW (ref >60)
GFR calc non Af Amer: 27 mL/min — ABNORMAL LOW (ref >60)
Glucose, Bld: 115 mg/dL — ABNORMAL HIGH (ref 70–99)
Glucose, Bld: 122 mg/dL — ABNORMAL HIGH (ref 70–99)
Phosphorus: 2.3 mg/dL — ABNORMAL LOW (ref 2.5–4.6)
Phosphorus: 2.9 mg/dL (ref 2.5–4.6)
Potassium: 4.6 mmol/L (ref 3.5–5.1)
Potassium: 5.1 mmol/L (ref 3.5–5.1)
Sodium: 136 mmol/L (ref 135–145)
Sodium: 135 mmol/L (ref 135–145)

## 2019-01-25 LAB — CBC WITH DIFFERENTIAL/PLATELET
Abs Immature Granulocytes: 1.16 10*3/uL — ABNORMAL HIGH (ref 0.00–0.07)
Basophils Absolute: 0.1 10*3/uL (ref 0.0–0.1)
Basophils Relative: 1 %
Eosinophils Absolute: 0.1 10*3/uL (ref 0.0–0.5)
Eosinophils Relative: 1 %
HCT: 23.5 % — ABNORMAL LOW (ref 39.0–52.0)
Hemoglobin: 7.5 g/dL — ABNORMAL LOW (ref 13.0–17.0)
Immature Granulocytes: 10 %
Lymphocytes Relative: 17 %
Lymphs Abs: 2 10*3/uL (ref 0.7–4.0)
MCH: 32.8 pg (ref 26.0–34.0)
MCHC: 31.9 g/dL (ref 30.0–36.0)
MCV: 102.6 fL — ABNORMAL HIGH (ref 80.0–100.0)
Monocytes Absolute: 1.3 10*3/uL — ABNORMAL HIGH (ref 0.1–1.0)
Monocytes Relative: 11 %
Neutro Abs: 7.3 10*3/uL (ref 1.7–7.7)
Neutrophils Relative %: 60 %
Platelets: 105 10*3/uL — ABNORMAL LOW (ref 150–400)
RBC: 2.29 MIL/uL — ABNORMAL LOW (ref 4.22–5.81)
RDW: 16.4 % — ABNORMAL HIGH (ref 11.5–15.5)
WBC: 11.9 10*3/uL — ABNORMAL HIGH (ref 4.0–10.5)
nRBC: 6.6 % — ABNORMAL HIGH (ref 0.0–0.2)

## 2019-01-25 LAB — MAGNESIUM: Magnesium: 2.6 mg/dL — ABNORMAL HIGH (ref 1.7–2.4)

## 2019-01-25 LAB — MISC LABCORP TEST (SEND OUT): Labcorp test code: 120256

## 2019-01-25 MED ORDER — HYDROCORTISONE NA SUCCINATE PF 100 MG IJ SOLR
50.0000 mg | Freq: Four times a day (QID) | INTRAMUSCULAR | Status: DC
  Administered 2019-01-25 – 2019-01-28 (×12): 50 mg via INTRAVENOUS
  Filled 2019-01-25 (×12): qty 2

## 2019-01-25 MED ORDER — PANTOPRAZOLE SODIUM 40 MG PO TBEC
40.0000 mg | DELAYED_RELEASE_TABLET | Freq: Two times a day (BID) | ORAL | Status: DC
  Administered 2019-01-25 – 2019-01-29 (×8): 40 mg via ORAL
  Filled 2019-01-25 (×9): qty 1

## 2019-01-25 MED ORDER — POLYETHYLENE GLYCOL 3350 17 G PO PACK
17.0000 g | PACK | Freq: Every day | ORAL | Status: DC
  Administered 2019-01-25 – 2019-02-10 (×13): 17 g via ORAL
  Filled 2019-01-25 (×13): qty 1

## 2019-01-25 MED ORDER — SODIUM BICARBONATE 8.4 % IV SOLN
INTRAVENOUS | Status: AC
  Filled 2019-01-25: qty 50

## 2019-01-25 NOTE — Progress Notes (Signed)
NAME:  JAKYLAN RON, MRN:  [id number], DOB:  1941/03/16, LOS: 12 ADMISSION DATE:  01/14/2019, CONSULTATION DATE:  01/20/2019 REFERRING MD:  Dr. Maudie Mercury, [facility] CHIEF COMPLAINT: GI bleeding  Brief History   78 y/o M, with CKD, admitted 5/28 with reports of SOB in the setting of suspected PNA and SOB.  While on the floor, he developed melena.  He was on heparin for RUE DVT. The patient was transferred to ICU 6/3 with hypotension.    PMH  CKD, HLD, HTN, CVA with Rt side weakness  Consults:  Nephrology IR GI  Procedures:  Tunneled HD 5/30 >> ETT 6/4 >> 6/5 Lt IJ CVL 6/5 >>  Significant Diagnostic Tests:  ECHO 5/29 >> LVEF 60-65%, RA moderately dilated Thoracentesis 5/30 >> glucose 122, protein < 3, LDH 122, WBC 217, cytology - reactive cells UE Venous Duplex 6/2 >> positive for acute R brachial vein DVT  EGD 6/4 >> adherent clot in distal gastric body, friable stomach mucosa, bleeding from duodenum with visible vessel tx with clips  Micro Data:  U. Strep 5/29 >> negative  COVID 5/29 >> negative RVP 5/30 >> negative Pleural Fluid 5/30 >> negative   BCx2 5/29 >> negative  BCx2 5/30 >> negative   Antimicrobials:  Azithro 5/29 >> 5/31  Vanco 5/29 >> 5/30 Cefepime 5/30 >> 6/4  Subjective:  Still on pressors, CRRT.  Feels like he is getting better.  Tolerating diet.  Denies chest/abd pain.  Objective   Blood pressure 96/63, pulse 91, temperature 98.1 F (36.7 C), temperature source Axillary, resp. rate 18, height _0  (1.88 m), weight 106.3 kg, SpO2 99 %.        Intake/Output Summary (Last 24 hours) at 01/25/2019 0906 Last data filed at 01/25/2019 0900 Gross per 24 hour  Intake 1380.1 ml  Output 2826 ml  Net -1445.9 ml   Filed Weights   01/23/19 0451 01/24/19 0500 01/25/19 0500  Weight: 108.3 kg 108.9 kg 106.3 kg    Examination:  General - alert Eyes - pupils reactive ENT - no sinus tenderness, no stridor, poor dentition Cardiac - regular rate/rhythm, no murmur  Chest - equal breath sounds b/l, no wheezing or rales Abdomen - soft, non tender, + bowel sounds Extremities - 1+ edema Skin - no rashes Neuro - follows commands, weak on Rt side from previous CVA   Resolved Problems:  Hyperkalemia, Compromised airway, Hemorrhagic shock  Assessment & Plan:   Hypotension from renal failure with CRRT, relative adrenal insufficiency (cortisol 18.4 from 01/24/19). Plan - pressors to keep SBP > 85 - continue midodrine 10 mg tid - add solu cortef stress dose 6/08  Upper GI bleeding. 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 Plan - will need GI follow up as outpt  Rt arm DVT. Plan - will need to check with GI when he can resume anticoagulation  AKI from ATN. CKD 4. Plan - CRRT per renal  Transudate pleural effusions. Plan - f/u CXR as needed  Abnormal Rib Findings on XRAY, Scapular Lesions on CT, Elevated Light Chains; concern for multiple myeloma. Plan - Consider ONC evaluation once stabilized.  TRH had previously discussed elevated light chains with ONC but waiting on follow up labs before formal consult  - IR arranging for bone marrow biopsy   Best practice:  Diet: carb modified, renal diet DVT prophylaxis: SCDs GI prophylaxis: protonix Mobility: Bed rest Code Status: Full Code Disposition:  ICU  Labs    CMP Latest Ref Rng & Units 01/25/2019 01/24/2019 01/24/2019  Glucose 70 - 99 mg/dL 115(H) 133(H) 106(H)  BUN 8 - 23 mg/dL _0 Creatinine 0.61 - 1.24 mg/dL 1.93(H) 2.03(H) 1.90(H)  Sodium 135 - 145 mmol/L 136 134(L) 135  Potassium 3.5 - 5.1 mmol/L 4.6 4.9 4.5  Chloride 98 - 111 mmol/L 102 101 103  CO2 22 - 32 mmol/L _1 Calcium 8.9 - 10.3 mg/dL 9.3 9.0 8.9  Total Protein 6.5 - 8.1 g/dL - - -  Total Bilirubin 0.3 - 1.2 mg/dL - - -  Alkaline Phos 38 - 126 U/L - - -  AST 15 - 41 U/L - - -  ALT 0 - 44 U/L - - -   CBC Latest Ref Rng & Units 01/25/2019 01/24/2019  01/23/2019  WBC 4.0 - 10.5 K/uL 11.9(H) 10.3 -  Hemoglobin 13.0 - 17.0 g/dL 7.5(L) 7.6(L) 7.6(L)  Hematocrit 39.0 - 52.0 % 23.5(L) 23.5(L) 23.8(L)  Platelets 150 - 400 K/uL 105(L) 103(L) -   CBG (last 3)  Recent Labs    01/24/19 1234  GLUCAP 113*   ABG    Component Value Date/Time   PHART 7.414 01/22/2019 0839   PCO2ART 36.8 01/22/2019 0839   PO2ART 104 01/22/2019 0839   HCO3 23.1 01/22/2019 0839   ACIDBASEDEF 0.8 01/22/2019 0839   O2SAT 97.8 01/22/2019 [place], MD [place] 01/25/2019, 9:06 AM

## 2019-01-25 NOTE — Progress Notes (Signed)
Patient ID: Scott Mcdowell, male   DOB: [birth year], 78 y.o.   MRN: [id number]  [facility] Progress Note   Assessment/ Plan:   1. 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  Volume overload - cont UF w/ iHD, not making any urine  3.  Anemia/thrombocytopenia/ ABL due to acute GI bleed: - transfuse prn 4.  Right arm brachial vein DVT: - IV hep dc'd due to GIB 5.  Hypotension/shock: Weaning pressors at this time. 6.  Vitamin D deficiency: Continue cholecalciferol supplementation when stabilized 7.  Upper GI bleed:  - duodenal bulb region bleeding treated endoscopically.  Scott Splinter, MD 01/25/2019, 5:35 AM    Subjective:   No new c/o's.     Objective:   BP (!) 96/49   Pulse 88   Temp 98.1 F (36.7 C) (Oral)   Resp (!) 21   Ht '6\' 2"'  (1.88 m)   Wt 108.9 kg   SpO2 100%   BMI 30.82 kg/m   Intake/Output Summary (Last 24 hours) at 01/25/2019 0529 Last data filed at 01/25/2019 0500 Gross per 24 hour  Intake 1258.88 ml  Output 3048 ml  Net -1789.12 ml   Weight change:   Physical Exam: Gen: Awake/alert, on CRRT, R IJ cath CVS: Pulse regular rhythm, normal rate, S1-S2 normal Resp: Anteriorly clear to auscultation, no rales Abd: Soft, obese, nontender Ext: 1-2+ LE and UE edema  Imaging: No results found.  Labs: BMET Recent Labs  Lab 01/22/19 0317 01/22/19 1600 01/23/19 0500 01/23/19 1600 01/24/19 0500 01/24/19 1600 01/25/19 0301  NA 134* 135 136 134* 135 134* 136  K 4.9 4.5 4.8 4.7 4.5 4.9 4.6  CL 100 104 103 102 103 101 102  CO2 '23 22 24 25 25 26 26  ' GLUCOSE 125* 101* 105* 138* 106* 133* 115*  BUN 66* 42* 37* 28* '23 20 17  ' CREATININE 2.74* 2.19*  2.14* 2.02* 1.90* 2.03* 1.93*  CALCIUM 8.8* 8.6* 9.1 9.0 8.9 9.0 9.3  PHOS 3.8 3.3 3.2 2.6 2.3* 2.5 2.3*   CBC Recent Labs  Lab 01/21/19 0959 01/21/19 1702 01/22/19 0317 01/23/19 0500 01/23/19 1600 01/24/19 0500 01/25/19 0301  WBC 12.2* 13.1* 13.3* 10.7*  --  10.3 11.9*  NEUTROABS 7.8* 8.0* 8.5*  --   --   --  7.3  HGB 6.8* 7.7* 7.6* 7.0* 7.6* 7.6* 7.5*  HCT 21.2* 23.1* 22.7* 21.7* 23.8* 23.5* 23.5*  MCV 100.5* 98.3 98.7 101.4*  --  100.4* 102.6*  PLT 89* 94* 122* 106*  --  103* 105*   Medications:    . chlorhexidine gluconate (MEDLINE KIT)  15 mL Mouth Rinse BID  . Chlorhexidine Gluconate Cloth  6 each Topical Daily  . mouth rinse  15 mL Mouth Rinse q12n4p  . midodrine  10 mg Oral TID WC  . nystatin   Topical TID  . pantoprazole  40 mg Intravenous Q12H

## 2019-01-25 NOTE — Progress Notes (Signed)
Patient had 6 beat run of VTach. Dr. Halford Chessman aware, will continue to wait.   CRRT filter changed. Patient tolerated well.

## 2019-01-25 NOTE — Progress Notes (Signed)
Patient ID: Scott Mcdowell, male   DOB: Sep 19, 1940, 78 y.o.   MRN: [id number]    Progress Note   Subjective  Day # 10  Undergoing CRRT  Currently says he feels ok - eating without difficulty , no abdominal pain, no stools HGB 7.5 stable EGD 6/4-Friable tissue in the duodenal bulb 1 area with visible vessel with active bleeding-treated   Objective   Vital signs in last 24 hours: Temp:  [97.6 F (36.4 C)-98.1 F (36.7 C)] 98.1 F (36.7 C) (06/08 0730) Pulse Rate:  [70-99] 91 (06/08 0800) Resp:  [10-30] 18 (06/08 0800) BP: (77-144)/(35-93) 104/51 (06/08 0930) SpO2:  [98 %-100 %] 99 % (06/08 0800) Weight:  [106.3 kg] 106.3 kg (06/08 0500) Last BM Date: 01/22/19 General: elderly    white male in NAD Heart:  Regular rate and rhythm; no murmurs Lungs: Respirations even and unlabored, lungs CTA bilaterally Abdomen:  Soft,large nontender and nondistended. Normal bowel sounds. Extremities: 1+ edema R greater than L foot/ankle Neurologic:  Alert and oriented,  grossly normal neurologically. Psych:  Cooperative. Normal mood and affect.  Intake/Output from previous day: 06/07 0701 - 06/08 0700 In: 6808 [P.O.:805; I.V.:456] Out: 2910  Intake/Output this shift: Total I/O In: 282.2 [P.O.:220; I.V.:62.2] Out: 334 [Other:334]  Lab Results: Recent Labs    01/23/19 0500 01/23/19 1600 01/24/19 0500 01/25/19 0301  WBC 10.7*  --  10.3 11.9*  HGB 7.0* 7.6* 7.6* 7.5*  HCT 21.7* 23.8* 23.5* 23.5*  PLT 106*  --  103* 105*   BMET Recent Labs    01/24/19 0500 01/24/19 1600 01/25/19 0301  NA 135 134* 136  K 4.5 4.9 4.6  CL 103 101 102  CO2 _0 GLUCOSE 106* 133* 115*  BUN _1 CREATININE 1.90* 2.03* 1.93*  CALCIUM 8.9 9.0 9.3   LFT Recent Labs    01/25/19 0301  ALBUMIN 3.6   PT/INR No results for input(s): LABPROT, INR in the last 72 hours.     Assessment / Plan:    #42 78 year old white male admitted 01/14/2019 with dyspnea/suspected pneumonia  #2  acute GI bleed in setting of heparin for right upper extremity DVT-upper endoscopy revealed a visible vessel in the duodenum which was treated.  Heparin has been on hold, no active bleeding, and hemoglobin stable -did receive 1 unit of packed RBCs yesterday.  Continue  PPI-was converted to 40 mg Protonix twice daily today  Continue serial hemoglobins, defer decision regarding restarting heparin.  Will discuss possible need for repeat EGD prior to re-anticoagulating  #3 chronic kidney disease-with superimposed acute kidney injury-now requiring CRRT #4 anemia secondary to acute blood loss-acute on chronic stable #5 hypotension/shock-weaning pressors #6-lytic bone lesions-elevated light chains-bone marrow biopsy scheduled for tomorrow per IR          Principal Problem:   Shock ([place]) Active Problems:   ARF (acute renal failure) (HCC)   Hyperkalemia   B12 deficiency anemia   Pleural effusion on right   Ascites   CAP (community acquired pneumonia)   Elevated troponin   Acute blood loss anemia   Upper GI bleed     LOS: 11 days      01/25/2019, 10:09 AM

## 2019-01-26 ENCOUNTER — Inpatient Hospital Stay (HOSPITAL_COMMUNITY): Payer: Medicare Other

## 2019-01-26 LAB — CBC WITH DIFFERENTIAL/PLATELET
Abs Immature Granulocytes: 0.88 10*3/uL — ABNORMAL HIGH (ref 0.00–0.07)
Basophils Absolute: 0 10*3/uL (ref 0.0–0.1)
Basophils Relative: 0 %
Eosinophils Absolute: 0 10*3/uL (ref 0.0–0.5)
Eosinophils Relative: 0 %
HCT: 22.5 % — ABNORMAL LOW (ref 39.0–52.0)
Hemoglobin: 7.2 g/dL — ABNORMAL LOW (ref 13.0–17.0)
Immature Granulocytes: 8 %
Lymphocytes Relative: 12 %
Lymphs Abs: 1.3 10*3/uL (ref 0.7–4.0)
MCH: 33.2 pg (ref 26.0–34.0)
MCHC: 32 g/dL (ref 30.0–36.0)
MCV: 103.7 fL — ABNORMAL HIGH (ref 80.0–100.0)
Monocytes Absolute: 0.9 10*3/uL (ref 0.1–1.0)
Monocytes Relative: 8 %
Neutro Abs: 8 10*3/uL — ABNORMAL HIGH (ref 1.7–7.7)
Neutrophils Relative %: 72 %
Platelets: 97 10*3/uL — ABNORMAL LOW (ref 150–400)
RBC: 2.17 MIL/uL — ABNORMAL LOW (ref 4.22–5.81)
RDW: 16.6 % — ABNORMAL HIGH (ref 11.5–15.5)
WBC: 11.1 10*3/uL — ABNORMAL HIGH (ref 4.0–10.5)
nRBC: 2.2 % — ABNORMAL HIGH (ref 0.0–0.2)

## 2019-01-26 LAB — RENAL FUNCTION PANEL
Albumin: 3.6 g/dL (ref 3.5–5.0)
Albumin: 3.7 g/dL (ref 3.5–5.0)
Anion gap: 9 (ref 5–15)
Anion gap: 8 (ref 5–15)
BUN: 22 mg/dL (ref 8–23)
BUN: 26 mg/dL — ABNORMAL HIGH (ref 8–23)
CO2: 24 mmol/L (ref 22–32)
CO2: 24 mmol/L (ref 22–32)
Calcium: 9.3 mg/dL (ref 8.9–10.3)
Calcium: 9.3 mg/dL (ref 8.9–10.3)
Chloride: 101 mmol/L (ref 98–111)
Chloride: 103 mmol/L (ref 98–111)
Creatinine, Ser: 2.21 mg/dL — ABNORMAL HIGH (ref 0.61–1.24)
Creatinine, Ser: 2.3 mg/dL — ABNORMAL HIGH (ref 0.61–1.24)
GFR calc Af Amer: 32 mL/min — ABNORMAL LOW (ref >60)
GFR calc Af Amer: 30 mL/min — ABNORMAL LOW (ref >60)
GFR calc non Af Amer: 27 mL/min — ABNORMAL LOW (ref >60)
GFR calc non Af Amer: 26 mL/min — ABNORMAL LOW (ref >60)
Glucose, Bld: 115 mg/dL — ABNORMAL HIGH (ref 70–99)
Glucose, Bld: 117 mg/dL — ABNORMAL HIGH (ref 70–99)
Phosphorus: 3.1 mg/dL (ref 2.5–4.6)
Phosphorus: 3.4 mg/dL (ref 2.5–4.6)
Potassium: 5 mmol/L (ref 3.5–5.1)
Potassium: 5 mmol/L (ref 3.5–5.1)
Sodium: 134 mmol/L — ABNORMAL LOW (ref 135–145)
Sodium: 135 mmol/L (ref 135–145)

## 2019-01-26 LAB — IMMUNOFIXATION ELECTROPHORESIS
IgA: 37 mg/dL — ABNORMAL LOW (ref 61–437)
IgG (Immunoglobin G), Serum: 477 mg/dL — ABNORMAL LOW (ref 603–1613)
IgM (Immunoglobulin M), Srm: 11 mg/dL — ABNORMAL LOW (ref 15–143)
Total Protein ELP: 5.6 g/dL — ABNORMAL LOW (ref 6.0–8.5)

## 2019-01-26 LAB — MAGNESIUM: Magnesium: 2.7 mg/dL — ABNORMAL HIGH (ref 1.7–2.4)

## 2019-01-26 LAB — PREPARE RBC (CROSSMATCH)

## 2019-01-26 MED ORDER — CHLORHEXIDINE GLUCONATE 0.12 % MT SOLN
OROMUCOSAL | Status: AC
  Administered 2019-01-26: 21:00:00 15 mL via OROMUCOSAL
  Filled 2019-01-26: qty 15

## 2019-01-26 MED ORDER — SODIUM CHLORIDE 0.9% IV SOLUTION
Freq: Once | INTRAVENOUS | Status: AC
  Administered 2019-01-26: 16:00:00 via INTRAVENOUS

## 2019-01-26 MED ORDER — PRO-STAT SUGAR FREE PO LIQD
30.0000 mL | Freq: Every day | ORAL | Status: DC
  Administered 2019-01-26 – 2019-02-02 (×6): 30 mL via ORAL
  Filled 2019-01-26 (×6): qty 30

## 2019-01-26 MED ORDER — MIDAZOLAM HCL 2 MG/2ML IJ SOLN
INTRAMUSCULAR | Status: AC
  Filled 2019-01-26: qty 4

## 2019-01-26 MED ORDER — ENSURE ENLIVE PO LIQD
237.0000 mL | Freq: Two times a day (BID) | ORAL | Status: DC
  Administered 2019-01-27 – 2019-02-04 (×13): 237 mL via ORAL

## 2019-01-26 MED ORDER — FENTANYL CITRATE (PF) 100 MCG/2ML IJ SOLN
INTRAMUSCULAR | Status: AC
  Filled 2019-01-26: qty 2

## 2019-01-26 MED ORDER — MIDAZOLAM HCL 2 MG/2ML IJ SOLN
INTRAMUSCULAR | Status: AC | PRN
  Administered 2019-01-26 (×2): 1 mg via INTRAVENOUS

## 2019-01-26 MED ORDER — HEPARIN SODIUM (PORCINE) 1000 UNIT/ML DIALYSIS
1000.0000 [IU] | INTRAMUSCULAR | Status: DC | PRN
  Filled 2019-01-26: qty 4
  Filled 2019-01-26: qty 1
  Filled 2019-01-26: qty 6

## 2019-01-26 MED ORDER — FENTANYL CITRATE (PF) 100 MCG/2ML IJ SOLN
INTRAMUSCULAR | Status: AC | PRN
  Administered 2019-01-26 (×2): 50 ug via INTRAVENOUS

## 2019-01-26 NOTE — Progress Notes (Signed)
Patient transported to IR. HD cath heparin locked.

## 2019-01-26 NOTE — Progress Notes (Signed)
Nutrition Follow-up  RD working remotely.   DOCUMENTATION CODES:   Not applicable  INTERVENTION:  - will order Ensure Enlive BID, each supplement provides 350 kcal and 20 grams of protein. - will order 30 mL Prostat once/day, each supplement provides 100 kcal and 15 grams of protein. - continue to encourage PO intakes.  - will monitor weight/fluid status and adjust supplements as needed.    NUTRITION DIAGNOSIS:   Increased nutrient needs related to acute illness, other (see comment)(CRRT) as evidenced by estimated needs. -revised, ongoing  GOAL:   Patient will meet greater than or equal to 90% of their needs -unmet  MONITOR:   PO intake, Supplement acceptance, Labs, Weight trends, I & O's  ASSESSMENT:   78 year old male who presented with worsening edema. He has a significant past medical history of CVA. Patient reported dyspnea for several weeks, no fever, chest pain or palpitations. He has LLE and scrotal edema. He was admitted for RLL PNA complicated by parapneumonic pleural effusion and AKI.   Patient was extubated on 6/5. Estimated nutrition needs this AM. Diet advanced from NPO to CLD on 6/5 at 7:10 PM and then to Renal, 1.2L restriction on 6/7 at 9:10 AM. Patient has been NPO since midnight and is out of room to Diagnostic Radiology for CT-guided bone marrow aspiration and biopsy.   Per RN flow sheet review, patient consumed 100% of breakfast, 90% of lunch, and 30% of dinner on 6/7; 25% of dinner yesterday. Flow sheet indicates that patient has deep pitting edema to BLE and mild to moderate edema to BUE. Current weight is +2.1 kg/4 lb compared to admission (5/29) weight.   Per notes: previous hypotension 2/2 renal failure, AKI from ATN--transition off CRRT once off pressors, UGIB, CT indicating cirrhosis changes--outpatient GI follow-up, R arm DVR, abnormal rib findings on xray and scapular lesions on CT--concern for multiple myeloma.    Medications reviewed; 50 mg  solu-cortef QID, 1 packet miralax/day.  Labs reviewed; Na: 134 mmol/l, creatinine: 2.21 mg/dl, Mg: 2.7 mg/dl, GFR: 27 ml/min. K and Phos WDL.    Diet Order:   Diet Order            Diet renal/carb modified with fluid restriction Diet-HS Snack? Nothing; Fluid restriction: 1200 mL Fluid; Room service appropriate? Yes; Fluid consistency: Thin  Diet effective now              EDUCATION NEEDS:   Not appropriate for education at this time  Skin:  Skin Assessment: Reviewed RN Assessment  Last BM:  6/4  Height:   Ht Readings from Last 1 Encounters:  01/21/19 6' 2" (1.88 m)    Weight:   Wt Readings from Last 1 Encounters:  01/26/19 106.7 kg    Ideal Body Weight:  86.4 kg  BMI:  Body mass index is 30.2 kg/m.  Estimated Nutritional Needs:   Kcal:  4098-1191 kcal  Protein:  125-140 grams  Fluid:  >/= 2 L/day     Scott Matin, MS, RD, LDN, [facility] Inpatient Clinical Dietitian Pager # [phone] After hours/weekend pager # [phone]

## 2019-01-26 NOTE — Progress Notes (Signed)
NAME:  Scott Mcdowell, MRN:  [id number], DOB:  1940/11/16, LOS: 86 ADMISSION DATE:  01/14/2019, CONSULTATION DATE:  01/20/2019 REFERRING MD:  Dr. Maudie Mercury, [facility] CHIEF COMPLAINT: GI bleeding  Brief History   78 y/o M, with CKD, admitted 5/28 with reports of SOB in the setting of suspected PNA and SOB.  While on the floor, he developed melena.  He was on heparin for RUE DVT. The patient was transferred to ICU 6/3 with hypotension.    PMH  CKD, HLD, HTN, CVA with Rt side weakness  Consults:  Nephrology IR GI  Procedures:  Tunneled HD 5/30 >> ETT 6/4 >> 6/5 Lt IJ CVL 6/5 >>  Significant Diagnostic Tests:  ECHO 5/29 >> LVEF 60-65%, RA moderately dilated Thoracentesis 5/30 >> glucose 122, protein < 3, LDH 122, WBC 217, cytology - reactive cells UE Venous Duplex 6/2 >> positive for acute R brachial vein DVT  EGD 6/4 >> adherent clot in distal gastric body, friable stomach mucosa, bleeding from duodenum with visible vessel tx with clips Bone marrow biopsy 6/09 >>   Micro Data:  U. Strep 5/29 >> negative  COVID 5/29 >> negative RVP 5/30 >> negative Pleural Fluid 5/30 >> negative   BCx2 5/29 >> negative  BCx2 5/30 >> negative   Antimicrobials:  Azithro 5/29 >> 5/31  Vanco 5/29 >> 5/30 Cefepime 5/30 >> 6/4  Subjective:  Had bone marrow bx this morning.  Objective   Blood pressure (!) 86/65, pulse 69, temperature 97.6 F (36.4 C), temperature source Oral, resp. rate 13, height _0  (1.88 m), weight 106.7 kg, SpO2 98 %.        Intake/Output Summary (Last 24 hours) at 01/26/2019 1058 Last data filed at 01/26/2019 1001 Gross per 24 hour  Intake 723.23 ml  Output 955 ml  Net -231.77 ml   Filed Weights   01/24/19 0500 01/25/19 0500 01/26/19 0500  Weight: 108.9 kg 106.3 kg 106.7 kg    Examination:  General - sleepy Eyes - pupils reactive ENT - no sinus tenderness, no stridor Cardiac - regular rate/rhythm, no murmur Chest - equal breath sounds b/l, no wheezing or rales  Abdomen - soft, non tender, + bowel sounds Extremities - 1+ edema Skin - no rashes Neuro - weak on Rt side from previous CVA   Resolved Problems:  Hyperkalemia, Compromised airway, Hemorrhagic shock  Assessment & Plan:   Hypotension from renal failure with CRRT, relative adrenal insufficiency (cortisol 18.4 from 01/24/19). Plan - wean off pressors to keep SBP > 85 - continue midodrine 10 mg tid - continue solu cortef  Upper GI bleeding. Plan - f/u CBC - continue protonix  Changes of cirrhosis on CT imaging. Plan - will need GI follow up as outpt  Rt arm DVT. 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 CKD 4. Plan - transition off CRRT once off pressors  Transudate pleural effusions. Plan - f/u CXR as needed  Abnormal Rib Findings on XRAY, Scapular Lesions on CT, Elevated Light Chains; concern for multiple myeloma. Plan - Consider ONC evaluation once stabilized.  TRH had previously discussed elevated light chains with ONC but waiting on follow up labs before formal consult  - f/u bone marrow biopsy   Best practice:  Diet: carb modified, renal diet DVT prophylaxis: SCDs GI prophylaxis: protonix Mobility: Bed rest Code Status: Full Code Disposition: ICU  Labs    CMP Latest Ref Rng & Units 01/26/2019 01/25/2019 01/25/2019  Glucose 70 - 99  mg/dL 115(H) 122(H) 115(H)  BUN 8 - 23 mg/dL _0 Creatinine 0.61 - 1.24 mg/dL 2.21(H) 2.20(H) 1.93(H)  Sodium 135 - 145 mmol/L 134(L) 135 136  Potassium 3.5 - 5.1 mmol/L 5.0 5.1 4.6  Chloride 98 - 111 mmol/L 101 102 102  CO2 22 - 32 mmol/L _1 Calcium 8.9 - 10.3 mg/dL 9.3 9.2 9.3  Total Protein 6.5 - 8.1 g/dL - - -  Total Bilirubin 0.3 - 1.2 mg/dL - - -  Alkaline Phos 38 - 126 U/L - - -  AST 15 - 41 U/L - - -  ALT 0 - 44 U/L - - -   CBC Latest Ref Rng & Units 01/26/2019 01/25/2019 01/24/2019  WBC 4.0 - 10.5 K/uL 11.1(H) 11.9(H) 10.3  Hemoglobin 13.0 - 17.0 g/dL 7.2(L) 7.5(L) 7.6(L)  Hematocrit  39.0 - 52.0 % 22.5(L) 23.5(L) 23.5(L)  Platelets 150 - 400 K/uL 97(L) 105(L) 103(L)   CBG (last 3)  Recent Labs    01/24/19 1234  GLUCAP 113*   ABG    Component Value Date/Time   PHART 7.414 01/22/2019 0839   PCO2ART 36.8 01/22/2019 0839   PO2ART 104 01/22/2019 0839   HCO3 23.1 01/22/2019 0839   ACIDBASEDEF 0.8 01/22/2019 0839   O2SAT 97.8 01/22/2019 [place], MD [place] 01/26/2019, 10:58 AM

## 2019-01-26 NOTE — Progress Notes (Signed)
Patient ID: Scott Mcdowell, male   DOB: [birth year], 78 y.o.   MRN: [id number]  [facility] Progress Note   Assessment/ Plan:   1. 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  Volume overload/ hypotension - moderate edema, unable to pull fluid w/ hypotension, no UOP - remains on 1 pressors, attempting to wean - CXR today is clear 3.  Anemia/thrombocytopenia/ ABL due to acute GI bleed: - transfuse prn - will order prbc's to hopefully help osmotic pressure and hypotension issue 4.  Right arm brachial vein DVT: - IV hep dc'd due to GIB 5.  Vitamin D deficiency: Continue cholecalciferol supplementation when stabilized 6.  Upper GI bleed:  - duodenal bulb region was treated endoscopically.  [name], MD 01/26/2019, 12:06 PM  CXR 6/9 - resolution of IS pattern/ edema, looks better  Subjective:   No new c/o's.     Objective:   BP (!) 81/53   Pulse 65   Temp 97.6 F (36.4 C) (Oral)   Resp 14   Ht '6\' 2"'$  (1.88 m)   Wt 106.7 kg   SpO2 100%   BMI 30.20 kg/m   Intake/Output Summary (Last 24 hours) at 01/26/2019 1206 Last data filed at 01/26/2019 1200 Gross per 24 hour  Intake 611.64 ml  Output 784 ml  Net -172.36 ml   Weight change: 0.4 kg  Physical Exam: Gen: Awake/alert, on CRRT, R IJ cath CVS: Pulse regular rhythm, normal rate, S1-S2 normal Resp: Anteriorly clear to auscultation, no rales Abd: Soft, obese, nontender Ext: 1-2+ LE and UE edema  Imaging: Ct Biopsy  Result Date: 01/26/2019 CLINICAL DATA:  Thrombocytopenia and anemia. Bone marrow biopsy requested for further workup. EXAM: CT GUIDED BONE MARROW ASPIRATION AND BIOPSY ANESTHESIA/SEDATION: Versed 2.0 mg IV, Fentanyl 100 mcg IV  Total Moderate Sedation Time:   10 minutes. The patient's level of consciousness and physiologic status were continuously monitored during the procedure by Radiology nursing. PROCEDURE: The procedure risks, benefits, and alternatives were explained to the patient. Questions regarding the procedure were encouraged and answered. The patient understands and consents to the procedure. A time out was performed prior to initiating the procedure. The right gluteal region was prepped with chlorhexidine. Sterile gown and sterile gloves were used for the procedure. Local anesthesia was provided with 1% Lidocaine. Under CT guidance, an 11 gauge On Control bone cutting needle was advanced from a posterior approach into the right iliac bone. Needle positioning was confirmed with CT. Initial non heparinized and heparinized aspirate samples were obtained of bone marrow. Core biopsy was performed via the On Control drill needle. COMPLICATIONS: None FINDINGS: Inspection of initial aspirate did reveal visible particles. Intact core biopsy sample was obtained. IMPRESSION: CT guided bone marrow biopsy of right posterior iliac bone with both aspirate and core samples obtained. Electronically Signed   By: Aletta Edouard M.D.   On: 01/26/2019 09:21   Dg Chest Port 1 View  Result Date: 01/26/2019 CLINICAL DATA:  Pulmonary edema, shortness of breath EXAM: PORTABLE CHEST 1 VIEW COMPARISON:  01/23/2019 FINDINGS: Right Vas-Cath, left central line remain in place, unchanged. Cardiomegaly with vascular congestion. Improving bilateral interstitial and airspace opacities, likely improving edema. Focal right  lower lobe opacity. Possible small effusions. IMPRESSION: Improving diffuse interstitial and airspace opacities, likely improving edema. Focal right lower lobe infiltrate. Suspect small effusions. Electronically Signed   By: Rolm Baptise M.D.   On: 01/26/2019 09:30   Ct Bone Marrow Biopsy & Aspiration  Result Date: 01/26/2019 CLINICAL  DATA:  Thrombocytopenia and anemia. Bone marrow biopsy requested for further workup. EXAM: CT GUIDED BONE MARROW ASPIRATION AND BIOPSY ANESTHESIA/SEDATION: Versed 2.0 mg IV, Fentanyl 100 mcg IV Total Moderate Sedation Time:   10 minutes. The patient's level of consciousness and physiologic status were continuously monitored during the procedure by Radiology nursing. PROCEDURE: The procedure risks, benefits, and alternatives were explained to the patient. Questions regarding the procedure were encouraged and answered. The patient understands and consents to the procedure. A time out was performed prior to initiating the procedure. The right gluteal region was prepped with chlorhexidine. Sterile gown and sterile gloves were used for the procedure. Local anesthesia was provided with 1% Lidocaine. Under CT guidance, an 11 gauge On Control bone cutting needle was advanced from a posterior approach into the right iliac bone. Needle positioning was confirmed with CT. Initial non heparinized and heparinized aspirate samples were obtained of bone marrow. Core biopsy was performed via the On Control drill needle. COMPLICATIONS: None FINDINGS: Inspection of initial aspirate did reveal visible particles. Intact core biopsy sample was obtained. IMPRESSION: CT guided bone marrow biopsy of right posterior iliac bone with both aspirate and core samples obtained. Electronically Signed   By: Aletta Edouard M.D.   On: 01/26/2019 09:21    Labs: BMET Recent Labs  Lab 01/23/19 0500 01/23/19 1600 01/24/19 0500 01/24/19 1600 01/25/19 0301 01/25/19 1955 01/26/19 0404  NA 136 134* 135 134* 136 135 134*  K 4.8 4.7 4.5 4.9 4.6 5.1 5.0  CL 103 102 103 101 102 102 101  CO2 _0 GLUCOSE 105* 138* 106* 133* 115* 122* 115*  BUN 37* 28* _1 CREATININE 2.14* 2.02* 1.90* 2.03* 1.93* 2.20* 2.21*  CALCIUM 9.1 9.0 8.9 9.0 9.3 9.2 9.3  PHOS 3.2 2.6 2.3* 2.5 2.3* 2.9 3.1   CBC Recent Labs  Lab  01/21/19 1702 01/22/19 0317 01/23/19 0500 01/23/19 1600 01/24/19 0500 01/25/19 0301 01/26/19 0406  WBC 13.1* 13.3* 10.7*  --  10.3 11.9* 11.1*  NEUTROABS 8.0* 8.5*  --   --   --  7.3 8.0*  HGB 7.7* 7.6* 7.0* 7.6* 7.6* 7.5* 7.2*  HCT 23.1* 22.7* 21.7* 23.8* 23.5* 23.5* 22.5*  MCV 98.3 98.7 101.4*  --  100.4* 102.6* 103.7*  PLT 94* 122* 106*  --  103* 105* 97*   Medications:    . chlorhexidine gluconate (MEDLINE KIT)  15 mL Mouth Rinse BID  . Chlorhexidine Gluconate Cloth  6 each Topical Daily  . feeding supplement (ENSURE ENLIVE)  237 mL Oral BID BM  . feeding supplement (PRO-STAT SUGAR FREE 64)  30 mL Oral Daily  . hydrocortisone sod succinate (SOLU-CORTEF) inj  50 mg Intravenous Q6H  . mouth rinse  15 mL Mouth Rinse q12n4p  . midodrine  10 mg Oral TID WC  . nystatin   Topical TID  . pantoprazole  40 mg Oral BID AC  . polyethylene glycol  17 g Oral Daily

## 2019-01-26 NOTE — Procedures (Signed)
Interventional Radiology Procedure Note  Procedure: CT guided bone marrow aspiration and biopsy  Complications: None  EBL: < 10 mL  Findings: Aspirate and core biopsy performed of bone marrow in right iliac bone.  Plan: Bedrest supine x 1 hrs  Aunica Dauphinee T. Brisia Schuermann, M.D Pager:  [phone]   

## 2019-01-27 DIAGNOSIS — K921 Melena: Secondary | ICD-10-CM

## 2019-01-27 LAB — BPAM RBC
Blood Product Expiration Date: 202007062359
ISSUE DATE / TIME: 202006091422
Unit Type and Rh: 5100

## 2019-01-27 LAB — MAGNESIUM: Magnesium: 2.7 mg/dL — ABNORMAL HIGH (ref 1.7–2.4)

## 2019-01-27 LAB — TYPE AND SCREEN
ABO/RH(D): O POS
Antibody Screen: NEGATIVE
Unit division: 0

## 2019-01-27 LAB — CBC
HCT: 22.9 % — ABNORMAL LOW (ref 39.0–52.0)
Hemoglobin: 7.3 g/dL — ABNORMAL LOW (ref 13.0–17.0)
MCH: 33 pg (ref 26.0–34.0)
MCHC: 31.9 g/dL (ref 30.0–36.0)
MCV: 103.6 fL — ABNORMAL HIGH (ref 80.0–100.0)
Platelets: 94 10*3/uL — ABNORMAL LOW (ref 150–400)
RBC: 2.21 MIL/uL — ABNORMAL LOW (ref 4.22–5.81)
RDW: 18.1 % — ABNORMAL HIGH (ref 11.5–15.5)
WBC: 8.2 10*3/uL (ref 4.0–10.5)
nRBC: 2.3 % — ABNORMAL HIGH (ref 0.0–0.2)

## 2019-01-27 LAB — RENAL FUNCTION PANEL
Albumin: 3.6 g/dL (ref 3.5–5.0)
Anion gap: 8 (ref 5–15)
BUN: 27 mg/dL — ABNORMAL HIGH (ref 8–23)
CO2: 25 mmol/L (ref 22–32)
Calcium: 9.5 mg/dL (ref 8.9–10.3)
Chloride: 103 mmol/L (ref 98–111)
Creatinine, Ser: 2.15 mg/dL — ABNORMAL HIGH (ref 0.61–1.24)
GFR calc Af Amer: 33 mL/min — ABNORMAL LOW (ref >60)
GFR calc non Af Amer: 28 mL/min — ABNORMAL LOW (ref >60)
Glucose, Bld: 98 mg/dL (ref 70–99)
Phosphorus: 3.1 mg/dL (ref 2.5–4.6)
Potassium: 4.8 mmol/L (ref 3.5–5.1)
Sodium: 136 mmol/L (ref 135–145)

## 2019-01-27 LAB — HEPATITIS A ANTIBODY, TOTAL: hep A Total Ab: NEGATIVE

## 2019-01-27 LAB — HEPATITIS B SURFACE ANTIBODY, QUANTITATIVE: Hep B S AB Quant (Post): 7.3 m[IU]/mL — ABNORMAL LOW (ref >9.9)

## 2019-01-27 LAB — HEPATITIS B CORE ANTIBODY, TOTAL: Hep B Core Total Ab: NEGATIVE

## 2019-01-27 MED ORDER — CHLORHEXIDINE GLUCONATE CLOTH 2 % EX PADS
6.0000 | MEDICATED_PAD | Freq: Every day | CUTANEOUS | Status: DC
  Administered 2019-01-29 – 2019-02-02 (×4): 6 via TOPICAL

## 2019-01-27 NOTE — Progress Notes (Signed)
Patient sat at the side of the bed for 15 minutes. Tolerated well. Waiting for PT to help get patient to chair.

## 2019-01-27 NOTE — Progress Notes (Signed)
Pt being transporedt to [facility] by [facility].  Report given,  patient belonging with him.

## 2019-01-27 NOTE — Progress Notes (Signed)
Patient ID: Scott Mcdowell, male   DOB: July 21, 1941, 78 y.o.   MRN: [id number]   Brief GI Note;   Hemoglobin 7.3 this a.m., has been stable over the past 3 days No active bleeding.  EGD was done on 01/21/2019 with finding of bleed secondary to a visible vessel in the duodenum which was treated endoscopically.  Patient bled in setting of heparin which had been started for right upper extremity DVT and heparin has been on hold.  Recommendations;-  Okay from GI standpoint to restart anticoagulation, would start heparin, and monitor, follow hemoglobin. Continue twice daily PPI If patient has evidence of recurrent being after resuming anticoagulation will need repeat endoscopic evaluation.  GI will be available as needed, thanks

## 2019-01-27 NOTE — Progress Notes (Signed)
Patient ID: Scott Mcdowell, male   DOB: [birth year], 78 y.o.   MRN: [id number]  [name] Progress Note   Assessment/ Plan:   1. 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  Volume overload/ hypotension: still has sig edema upper and LE's - CXR clear 6/9 - UF as tol on HD tomorrow, fluid restriction 3.  Anemia prob largely due to myeloma/ ABL due to acute GI bleed: - transfuse prn,  4.  Right arm brachial vein DVT: - IV hep dc'd due to GIB 5.  Vitamin D deficiency: Continue cholecalciferol supplementation when stabilized 6.  Upper GI bleed:  - duodenal bulb region was treated endoscopically.  [name], MD 01/27/2019, 8:15 AM  CXR 6/9 - resolution of IS pattern/ edema  Subjective:   No new c/o's.     Objective:   BP (!) 116/50   Pulse 88   Temp 97.6 F (36.4 C) (Oral)   Resp 19   Ht 6' 2" (1.88 m)   Wt 105.7 kg   SpO2 100%   BMI 29.92 kg/m   Intake/Output Summary (Last 24 hours) at 01/27/2019 0815 Last data filed at 01/27/2019 0800 Gross per 24 hour  Intake 1024.53 ml  Output 888 ml  Net 136.53 ml   Weight change: -1 kg  Physical Exam: Gen: Awake/alert, on CRRT, R IJ cath CVS: Pulse regular rhythm, normal rate, S1-S2 normal Resp: Anteriorly clear to auscultation, no rales Abd: Soft, obese, nontender Ext: 1-2+ LE and UE edema  Imaging: Ct Biopsy  Result Date: 01/26/2019 CLINICAL DATA:  Thrombocytopenia and anemia. Bone marrow biopsy requested for further workup. EXAM: CT GUIDED BONE MARROW ASPIRATION AND BIOPSY ANESTHESIA/SEDATION: Versed 2.0 mg IV, Fentanyl 100 mcg IV Total Moderate Sedation Time:   10 minutes. The patient's level of consciousness and physiologic status were continuously monitored during  the procedure by Radiology nursing. PROCEDURE: The procedure risks, benefits, and alternatives were explained to the patient. Questions regarding the procedure were encouraged and answered. The patient understands and consents to the procedure. A time out was performed prior to initiating the procedure. The right gluteal region was prepped with chlorhexidine. Sterile gown and sterile gloves were used for the procedure. Local anesthesia was provided with 1% Lidocaine. Under CT guidance, an 11 gauge On Control bone cutting needle was advanced from a posterior approach into the right iliac bone. Needle positioning was confirmed with CT. Initial non heparinized and heparinized aspirate samples were obtained of bone marrow. Core biopsy was performed via the On Control drill needle. COMPLICATIONS: None FINDINGS: Inspection of initial aspirate did reveal visible particles. Intact core biopsy sample was obtained. IMPRESSION: CT guided bone marrow biopsy of right posterior iliac bone with both aspirate and core samples obtained. Electronically Signed   By: Aletta Edouard M.D.   On: 01/26/2019 09:21   Dg Chest Port 1 View  Result Date: 01/26/2019 CLINICAL DATA:  Pulmonary edema, shortness of breath EXAM: PORTABLE CHEST 1 VIEW COMPARISON:  01/23/2019 FINDINGS: Right Vas-Cath, left central line remain in place, unchanged. Cardiomegaly with vascular congestion. Improving bilateral interstitial and airspace opacities, likely improving edema. Focal right lower lobe opacity. Possible small effusions. IMPRESSION: Improving diffuse interstitial and airspace opacities, likely improving edema. Focal  right lower lobe infiltrate. Suspect small effusions. Electronically Signed   By: Rolm Baptise M.D.   On: 01/26/2019 09:30   Ct Bone Marrow Biopsy & Aspiration  Result Date: 01/26/2019 CLINICAL DATA:  Thrombocytopenia and anemia. Bone marrow biopsy requested for further workup. EXAM: CT GUIDED BONE MARROW ASPIRATION AND BIOPSY  ANESTHESIA/SEDATION: Versed 2.0 mg IV, Fentanyl 100 mcg IV Total Moderate Sedation Time:   10 minutes. The patient's level of consciousness and physiologic status were continuously monitored during the procedure by Radiology nursing. PROCEDURE: The procedure risks, benefits, and alternatives were explained to the patient. Questions regarding the procedure were encouraged and answered. The patient understands and consents to the procedure. A time out was performed prior to initiating the procedure. The right gluteal region was prepped with chlorhexidine. Sterile gown and sterile gloves were used for the procedure. Local anesthesia was provided with 1% Lidocaine. Under CT guidance, an 11 gauge On Control bone cutting needle was advanced from a posterior approach into the right iliac bone. Needle positioning was confirmed with CT. Initial non heparinized and heparinized aspirate samples were obtained of bone marrow. Core biopsy was performed via the On Control drill needle. COMPLICATIONS: None FINDINGS: Inspection of initial aspirate did reveal visible particles. Intact core biopsy sample was obtained. IMPRESSION: CT guided bone marrow biopsy of right posterior iliac bone with both aspirate and core samples obtained. Electronically Signed   By: Aletta Edouard M.D.   On: 01/26/2019 09:21    Labs: BMET Recent Labs  Lab 01/24/19 0500 01/24/19 1600 01/25/19 0301 01/25/19 1955 01/26/19 0404 01/26/19 1827 01/27/19 0525  NA 135 134* 136 135 134* 135 136  K 4.5 4.9 4.6 5.1 5.0 5.0 4.8  CL 103 101 102 102 101 103 103  CO2 _0 GLUCOSE 106* 133* 115* 122* 115* 117* 98  BUN _1 26* 27*  CREATININE 1.90* 2.03* 1.93* 2.20* 2.21* 2.30* 2.15*  CALCIUM 8.9 9.0 9.3 9.2 9.3 9.3 9.5  PHOS 2.3* 2.5 2.3* 2.9 3.1 3.4 3.1   CBC Recent Labs  Lab 01/21/19 1702 01/22/19 0317 01/23/19 0500 01/23/19 1600 01/24/19 0500 01/25/19 0301 01/26/19 0406  WBC 13.1* 13.3* 10.7*  --  10.3 11.9*  11.1*  NEUTROABS 8.0* 8.5*  --   --   --  7.3 8.0*  HGB 7.7* 7.6* 7.0* 7.6* 7.6* 7.5* 7.2*  HCT 23.1* 22.7* 21.7* 23.8* 23.5* 23.5* 22.5*  MCV 98.3 98.7 101.4*  --  100.4* 102.6* 103.7*  PLT 94* 122* 106*  --  103* 105* 97*   Medications:    . chlorhexidine gluconate (MEDLINE KIT)  15 mL Mouth Rinse BID  . Chlorhexidine Gluconate Cloth  6 each Topical Daily  . feeding supplement (ENSURE ENLIVE)  237 mL Oral BID BM  . feeding supplement (PRO-STAT SUGAR FREE 64)  30 mL Oral Daily  . hydrocortisone sod succinate (SOLU-CORTEF) inj  50 mg Intravenous Q6H  . mouth rinse  15 mL Mouth Rinse q12n4p  . midodrine  10 mg Oral TID WC  . nystatin   Topical TID  . pantoprazole  40 mg Oral BID AC  . polyethylene glycol  17 g Oral Daily

## 2019-01-27 NOTE — Evaluation (Signed)
Occupational Therapy Re-Evaluation Patient Details Name: Scott Mcdowell MRN: [id number] DOB: Aug 18, 1941 Today's Date: 01/27/2019    History of Present Illness 78 yo male admitted with ARF, scrotal cellulitis, Pna. Pt extubated 01/22/19.  Hx of CVA with R hemiparesis   Clinical Impression   Pt has had a decline in function since initial evaluation; he now needs +2 assist.  Pt did come in with RUE weakness; he reports that he had good function in this since CVA in '04 and could even write. Pt has 2-/5 strength throughout and extensors are weaker than flexors.  He was unable to step towards R for transfer back to bed; he walked prior to admission and needed +1 caregiver at time of initial evaluation. Pt is very motivated and would benefit from CIR at discharge to return to a mod I level. Goals in acute are for min A level    Follow Up Recommendations  CIR    Equipment Recommendations  None recommended by OT    Recommendations for Other Services       Precautions / Restrictions Precautions Precautions: Fall Precaution Comments: R hemiparesis, h/o 1 fall in past 1 year Restrictions Weight Bearing Restrictions: No      Mobility Bed Mobility Overal bed mobility: Needs Assistance Bed Mobility: Supine to Sit      Sit to supine: Mod assist;+2 for physical assistance   General bed mobility comments: assist for legs and to guide trunk  Transfers Overall transfer level: Needs assistance Equipment used: 2 person hand held assist Transfers: Sit to/from Stand;Stand Pivot Transfers Sit to Stand: Mod assist;+2 physical assistance Stand pivot transfers: Mod assist;+2 physical assistance(SPT/squat pivot to L side)       General transfer comment: assist to stand and stabilize. Pt unable to pivot to R side    Balance Overall balance assessment: Needs assistance   Sitting balance-Leahy Scale: Fair     Standing balance support: Bilateral upper extremity supported Standing  balance-Leahy Scale: Poor                             ADL either performed or assessed with clinical judgement   ADL   Eating/Feeding: Set up;Sitting   Grooming: Minimal assistance;Bed level;Moderate assistance(lotion )   Upper Body Bathing: Moderate assistance   Lower Body Bathing: Total assistance;+2 for physical assistance;Sit to/from stand(pt 10%)   Upper Body Dressing : Moderate assistance;Maximal assistance   Lower Body Dressing: Total assistance;+2 for physical assistance;Sit to/from stand   Toilet Transfer: Moderate assistance;+2 for physical assistance;Stand-pivot;Squat-pivot(chair to bed, to L side)             General ADL Comments: able to hold lotion with RUE as gross assist     Vision         Perception     Praxis      Pertinent Vitals/Pain Pain Assessment: No/denies pain     Hand Dominance     Extremity/Trunk Assessment Upper Extremity Assessment RUE Deficits / Details: Pt states that he was able to write with RUE prior to admission; now 2-/5 throughout except wrist extension 1+/5.  Limited ROM to neutral in supination and wrist extension.  Flexors stronger than L           Communication Communication Communication: No difficulties   Cognition Arousal/Alertness: Awake/alert Behavior During Therapy: WFL for tasks assessed/performed Overall Cognitive Status: Within Functional Limits for tasks assessed  General Comments  gave pt "homework" of wiggling fingers (not flexing completely due to muscle imbalance), and elbow flexion/extension.  Educated when putting lotion on, to stroke back towards heart, and cross wrist.  Edema is mostly in forearm    Exercises    Shoulder Instructions      Home Living Family/patient expects to be discharged to:: Unsure Living Arrangements: Spouse/significant other Available Help at Discharge: Family                         Home  Equipment: Gilford Rile - 2 wheels;Cane - single point          Prior Functioning/Environment Level of Independence: Independent with assistive device(s)        Comments: uses RW vs cane PRN. "On a good day" pt can ambulate short distances unassisted.   Was able to write and use R hand functionally        OT Problem List:        OT Treatment/Interventions:      OT Goals(Current goals can be found in the care plan section) Acute Rehab OT Goals Patient Stated Goal: to regain PLOF OT Goal Formulation: With patient Time For Goal Achievement: 02/10/19 Potential to Achieve Goals: Good ADL Goals Pt Will Perform Grooming: with min assist;sitting Pt Will Transfer to Toilet: with min assist;stand pivot transfer;bedside commode Additional ADL Goal #1: pt will stand and stabilize with AD with min A for adls x 2 minutes Additional ADL Goal #2: pt will incorporate RUE as assist without cues at least one time per session Additional ADL Goal #3: pt will be independent with RUE strengthening program  OT Frequency: Min 2X/week   Barriers to D/C:            Co-evaluation              AM-PAC OT "6 Clicks" Daily Activity     Outcome Measure Help from another person eating meals?: A Little Help from another person taking care of personal grooming?: A Lot Help from another person toileting, which includes using toliet, bedpan, or urinal?: A Lot Help from another person bathing (including washing, rinsing, drying)?: A Lot Help from another person to put on and taking off regular upper body clothing?: A Lot Help from another person to put on and taking off regular lower body clothing?: Total 6 Click Score: 12   End of Session    Activity Tolerance: Patient tolerated treatment well Patient left: in bed;with call bell/phone within reach;with chair alarm set  OT Visit Diagnosis: Unsteadiness on feet (R26.81);Muscle weakness (generalized) (M62.81);Other abnormalities of gait and mobility  (R26.89);History of falling (Z91.81);Repeated falls (R29.6)                Time: 0160-1093 OT Time Calculation (min): 29 min Charges:  OT General Charges $OT Visit: 1 Visit OT Evaluation $OT Re-eval: 1 Re-eval OT Treatments $Therapeutic Activity: 8-22 mins  Lesle Chris, OTR/L Acute Rehabilitation Services [phone] [facility] pager [phone] office 01/27/2019  [place] 01/27/2019, 4:14 PM

## 2019-01-27 NOTE — Progress Notes (Signed)
Physical Therapy Treatment Patient Details Name: Scott Mcdowell MRN: [id number] DOB: May 28, 1941 Today's Date: 01/27/2019    History of Present Illness 78 yo male admitted with ARF, scrotal cellulitis, Pna. Pt extubated 01/22/19.  Hx of CVA with R hemiparesis    PT Comments    Pt very pleasant and puts forth good effort. Edema noted RUE and LE, pt at baseline can write with R hand but cannot do so now. Pt sat edge of bed x 12 minutes, then ambulated 5' with RW. Performed BUE/LE strengthening exercises with assistance. Pt would be good CIR candidate.    Follow Up Recommendations  CIR     Equipment Recommendations  None recommended by PT    Recommendations for Other Services Rehab consult;OT consult     Precautions / Restrictions Precautions Precautions: Fall Precaution Comments: R hemiparesis, h/o 1 fall in past 1 year Restrictions Weight Bearing Restrictions: No    Mobility  Bed Mobility Overal bed mobility: Needs Assistance Bed Mobility: Supine to Sit     Supine to sit: Mod assist;+2 for physical assistance     General bed mobility comments: mod A to raise trunk and advance RLE; pt sat edge of bed x 12 minutes   Transfers Overall transfer level: Needs assistance Equipment used: Rolling walker (2 wheeled) Transfers: Sit to/from [facility] Sit to Stand: Mod assist;+2 safety/equipment;From elevated surface Stand pivot transfers: +2 safety/equipment;Mod assist(Transfer from chair to [facility] then bed, Max A)       General transfer comment: Pt performed marching in place with RW at edge of bed, no buckling of LEs. Difficulty lifting RLE, with edema noted RLE. Good effort put forth. Sit to stand x 2 trials from elevated bed.   Ambulation/Gait Ambulation/Gait assistance: Min assist;+2 safety/equipment Gait Distance (Feet): 5 Feet Assistive device: Rolling walker (2 wheeled) Gait Pattern/deviations: Step-to pattern Gait velocity: decr   General Gait  Details: side steps at edge of bed, then pivotal steps to recliner with RW, no buckling, distance limited by fatigue   Stairs             Wheelchair Mobility    Modified Rankin (Stroke Patients Only)       Balance Overall balance assessment: Needs assistance   Sitting balance-Leahy Scale: Fair     Standing balance support: Bilateral upper extremity supported Standing balance-Leahy Scale: Poor                              Cognition Arousal/Alertness: Awake/alert Behavior During Therapy: WFL for tasks assessed/performed Overall Cognitive Status: Within Functional Limits for tasks assessed                                        Exercises General Exercises - Lower Extremity Ankle Circles/Pumps: AAROM;Right;10 reps;Supine Long Arc Quad: AAROM;Both;10 reps;Seated Shoulder Exercises Shoulder Flexion: AAROM;Right;10 reps;Seated    General Comments        Pertinent Vitals/Pain Pain Assessment: No/denies pain    Home Living                      Prior Function            PT Goals (current goals can now be found in the care plan section) Acute Rehab PT Goals Patient Stated Goal: to regain PLOF PT Goal Formulation: With patient Time For Goal  Achievement: 02/01/19 Potential to Achieve Goals: Good Progress towards PT goals: Progressing toward goals    Frequency    Min 3X/week      PT Plan Current plan remains appropriate    Co-evaluation              AM-PAC PT "6 Clicks" Mobility   Outcome Measure  Help needed turning from your back to your side while in a flat bed without using bedrails?: A Lot Help needed moving from lying on your back to sitting on the side of a flat bed without using bedrails?: A Lot Help needed moving to and from a bed to a chair (including a wheelchair)?: A Lot Help needed standing up from a chair using your arms (e.g., wheelchair or bedside chair)?: A Lot Help needed to walk in  hospital room?: A Lot Help needed climbing 3-5 steps with a railing? : Total 6 Click Score: 11    End of Session Equipment Utilized During Treatment: Gait belt Activity Tolerance: Patient tolerated treatment well Patient left: with call bell/phone within reach;in chair;with chair alarm set(RN and NT in room at EOS) Nurse Communication: Mobility status PT Visit Diagnosis: Muscle weakness (generalized) (M62.81);Difficulty in walking, not elsewhere classified (R26.2);Hemiplegia and hemiparesis Hemiplegia - Right/Left: Right Hemiplegia - dominant/non-dominant: Dominant Hemiplegia - caused by: Cerebral infarction     Time: 1210-1251 PT Time Calculation (min) (ACUTE ONLY): 41 min  Charges:  $Therapeutic Exercise: 8-22 mins $Therapeutic Activity: 23-37 mins                     Blondell Reveal Kistler PT 01/27/2019  Acute Rehabilitation Services Pager [phone] Office [phone]

## 2019-01-27 NOTE — Progress Notes (Signed)
NAME:  Scott Mcdowell, MRN:  [id number], DOB:  Sep 16, 1940, LOS: 43 ADMISSION DATE:  01/14/2019, CONSULTATION DATE:  01/20/2019 REFERRING MD:  Dr. Maudie Mercury, [facility] CHIEF COMPLAINT: GI bleeding  Brief History   78 y/o M, with CKD, admitted 5/28 with reports of SOB in the setting of suspected PNA and SOB.  While on the floor, he developed melena.  He was on heparin for RUE DVT. The patient was transferred to ICU 6/3 with hypotension.    PMH  CKD, HLD, HTN, CVA with Rt side weakness  Significant Events:  5/28 Transfer from [facility] with scrotal cellulitis, AKI, b/l hydrocele, RLL PNA 5/30 Rt thoracentesis, transfuse 1 unit PRBC 5/31 Nephrology consulted 6/03 start heparin gtt for Rt arm DVT 6/04 transfer to ICU, intubated, GI consulted for maroon colored stools, EGD, start CRRT, start pressors 6/05 Extubated 6/09 BM biopsy 6/10 Off pressors, transition off CRRT, transfer to [facility] for continue HD needs  Consults:  Nephrology IR GI  Procedures:  Tunneled HD 5/30 >> ETT 6/4 >> 6/5 Lt IJ CVL 6/5 >>  Significant Diagnostic Tests:  ECHO 5/29 >> LVEF 60-65%, RA moderately dilated Thoracentesis 5/30 >> glucose 122, protein < 3, LDH 122, WBC 217, cytology - reactive cells UE Venous Duplex 6/2 >> positive for acute R brachial vein DVT  EGD 6/4 >> adherent clot in distal gastric body, friable stomach mucosa, bleeding from duodenum with visible vessel tx with clips Bone marrow biopsy 6/09 >>   Micro Data:  U. Strep 5/29 >> negative  COVID 5/29 >> negative RVP 5/30 >> negative Pleural Fluid 5/30 >> negative   BCx2 5/29 >> negative  BCx2 5/30 >> negative   Antimicrobials:  Azithro 5/29 >> 5/31  Vanco 5/29 >> 5/30 Cefepime 5/30 >> 6/4  Subjective:  Feels better.  Off pressors.  Transitioning off CRRT.  Objective   Blood pressure (!) 116/50, pulse 88, temperature (!) 97.3 F (36.3 C), temperature source Oral, resp. rate 19, height '6\' 2"'  (1.88 m), weight 105.7 kg, SpO2 100  %.        Intake/Output Summary (Last 24 hours) at 01/27/2019 0932 Last data filed at 01/27/2019 0800 Gross per 24 hour  Intake 1284.53 ml  Output 888 ml  Net 396.53 ml   Filed Weights   01/25/19 0500 01/26/19 0500 01/27/19 0500  Weight: 106.3 kg 106.7 kg 105.7 kg    Examination:  General - alert Eyes - pupils reactive ENT - no sinus tenderness, no stridor, poor dentition Cardiac - regular rate/rhythm, no murmur Chest - equal breath sounds b/l, no wheezing or rales Abdomen - soft, non tender, + bowel sounds Extremities - 1+ edema Skin - no rashes Neuro - Rt sided weakness from previous CVA   Resolved Problems:  Hyperkalemia, Compromised airway, Hemorrhagic shock, Scrotal cellulitis  Assessment & Plan:   Hypotension from renal failure with CRRT, relative adrenal insufficiency (cortisol 18.4 from 01/24/19). Plan - continue midodrine 10 mg tid - can start to wean off solu cortef as tolerated from 6/11  Upper GI bleeding. Plan - f/u CBC - transfuse for Hb < 7 or bleeding - continue protonix bid  Changes of cirrhosis on CT imaging. Plan - will need outpt GI follow up  Rt arm DVT. Plan - defer anticoagulation until ok with GI  AKI from ATN. CKD 4. Plan - transitioning to iHD 6/10  Transudate pleural effusions. Plan - f/u CXR as needed  Abnormal Rib Findings on XRAY, Scapular Lesions on CT, Elevated Light  Chains; concern for multiple myeloma. Plan - Consider ONC evaluation once stabilized.  TRH had previously discussed elevated light chains with ONC but waiting on follow up labs before formal consult  - f/u bone marrow biopsy from 6/09  Deconditioning. Plan - PT/OT   Best practice:  Diet: carb modified, renal diet DVT prophylaxis: SCDs GI prophylaxis: protonix Mobility: Bed rest Code Status: Full Code Disposition: transfer to [facility] for iHD; will ask [facility] to assume care from 6/11 and PCCM off.  Labs    CMP Latest Ref Rng & Units 01/27/2019 01/26/2019  01/26/2019  Glucose 70 - 99 mg/dL 98 117(H) 115(H)  BUN 8 - 23 mg/dL 27(H) 26(H) 22  Creatinine 0.61 - 1.24 mg/dL 2.15(H) 2.30(H) 2.21(H)  Sodium 135 - 145 mmol/L 136 135 134(L)  Potassium 3.5 - 5.1 mmol/L 4.8 5.0 5.0  Chloride 98 - 111 mmol/L 103 103 101  CO2 22 - 32 mmol/L '25 24 24  ' Calcium 8.9 - 10.3 mg/dL 9.5 9.3 9.3  Total Protein 6.5 - 8.1 g/dL - - -  Total Bilirubin 0.3 - 1.2 mg/dL - - -  Alkaline Phos 38 - 126 U/L - - -  AST 15 - 41 U/L - - -  ALT 0 - 44 U/L - - -   CBC Latest Ref Rng & Units 01/26/2019 01/25/2019 01/24/2019  WBC 4.0 - 10.5 K/uL 11.1(H) 11.9(H) 10.3  Hemoglobin 13.0 - 17.0 g/dL 7.2(L) 7.5(L) 7.6(L)  Hematocrit 39.0 - 52.0 % 22.5(L) 23.5(L) 23.5(L)  Platelets 150 - 400 K/uL 97(L) 105(L) 103(L)   CBG (last 3)  Recent Labs    01/24/19 1234  GLUCAP 113*   ABG    Component Value Date/Time   PHART 7.414 01/22/2019 0839   PCO2ART 36.8 01/22/2019 0839   PO2ART 104 01/22/2019 0839   HCO3 23.1 01/22/2019 0839   ACIDBASEDEF 0.8 01/22/2019 0839   O2SAT 97.8 01/22/2019 [place], MD [employer] 01/27/2019, 9:32 AM

## 2019-01-27 NOTE — Progress Notes (Signed)
CRRT stopped. Blood returned 161mL. Heparin locked.

## 2019-01-28 ENCOUNTER — Encounter (HOSPITAL_COMMUNITY): Payer: Self-pay | Admitting: Physical Medicine and Rehabilitation

## 2019-01-28 DIAGNOSIS — N184 Chronic kidney disease, stage 4 (severe): Secondary | ICD-10-CM

## 2019-01-28 DIAGNOSIS — R5381 Other malaise: Secondary | ICD-10-CM

## 2019-01-28 LAB — BASIC METABOLIC PANEL
Anion gap: 10 (ref 5–15)
BUN: 56 mg/dL — ABNORMAL HIGH (ref 8–23)
CO2: 24 mmol/L (ref 22–32)
Calcium: 9.6 mg/dL (ref 8.9–10.3)
Chloride: 101 mmol/L (ref 98–111)
Creatinine, Ser: 4.12 mg/dL — ABNORMAL HIGH (ref 0.61–1.24)
GFR calc Af Amer: 15 mL/min — ABNORMAL LOW (ref >60)
GFR calc non Af Amer: 13 mL/min — ABNORMAL LOW (ref >60)
Glucose, Bld: 126 mg/dL — ABNORMAL HIGH (ref 70–99)
Potassium: 4.6 mmol/L (ref 3.5–5.1)
Sodium: 135 mmol/L (ref 135–145)

## 2019-01-28 MED ORDER — HYDROCORTISONE NA SUCCINATE PF 100 MG IJ SOLR
50.0000 mg | Freq: Two times a day (BID) | INTRAMUSCULAR | Status: DC
  Administered 2019-01-28 – 2019-01-31 (×6): 50 mg via INTRAVENOUS
  Filled 2019-01-28 (×6): qty 2

## 2019-01-28 NOTE — Progress Notes (Signed)
PROGRESS NOTE    Scott Mcdowell  RNH:[id number] DOB: 1941/04/30 DOA: 01/14/2019 PCP: [name], MD   Brief Narrative: Scott Mcdowell is a 78 y.o. male presenting with edema. Patient admitted with pneumonia and his hospital course has been complicated by AKI requiring renal replacement therapy, circulatory shock requiring vasopressors, respiratory failure requiring brief intubation, acute blood loss anemia from bleeding upper GI sources requiring EGD intervention and right upper extremity DVT requiring anticoagulation prior to being now complicated by upper GI bleeding.    Assessment & Plan:   Principal Problem:   Shock ([place]) Active Problems:   ARF (acute renal failure) (HCC)   Hyperkalemia   B12 deficiency anemia   Pleural effusion on right   Ascites   CAP (community acquired pneumonia)   Elevated troponin   Acute blood loss anemia   Upper GI bleed   AKI secondary to ATN Hyperkalemia Initially managed with diuresis with poor effect. While in [place], he was started on CRRT. He is now transitioned to IHD with the hopes of return of renal function. Only 50 mL of urine documented over the last 24 hours -Nephrology recommendations: HD today -Strict urine output  Hypotension/shock Patient required the use of vasopressors while in the ICU. Concern for relative adrenal insufficiency in the ICU and has been started on hydrocortisone in addition to midodrine. Blood pressure still soft. -Continue hydrocortisone, but wean to BID -Continue midodrine -Vitals q6 hours  Anemia Thrombocytopenia Acute blood loss anemia Upper GI bleeding Hemoglobin stable. Recent EGD performed on 6/4 and significant for multiple areas of bleeding which were treated with clip and epi. Iron studies normal except a mildly elevated ferritin. GI signed off and recommending to restart anticoagulation as indicated -Daily CBC -If recurrent bleeding (high risk), re-consult GI  Right upper extremity  DVT Treatment complicated by anemia requiring multiple transfusions, in addition to epistaxis and hemoptysis. Hemoglobin stable currently. -Will likely need to start Coumadin in light of renal disease  Pneumonia Associated right para-pneumonia effusion. Completed antibiotic course. Also underwent thoracentesis. Resolved  Lytic rib lesion Concern for possible malignancy. ?MM.  Scrotal cellulitis Bilateral hydrocele Cellulitis resolved and scrotal swelling improved.  History of CVA Associated right sided deficits. Per patient, he was able to ambulate pretty well prior to current illness. PT/OT recommending CIR -CIR order placed  Pressure injuries Sacrum, left heel, right heel, POA  Cirrhosis Seen on CT. Will need outpatient follow-up.  Obesity Body mass index is 30.91 kg/m.   DVT prophylaxis: SCDs Code Status:   Code Status: Full Code Family Communication: None Disposition Plan: Possible CIR if able vs SNF pending management of kidney failure   Consultants:   PCCM  Nephrology  [facility]  Procedures:   6/4: EGD Impression:               - Normal esophagus.                           - Adherent clot in the distal gastric body, unclear                            if NG tube trauma but treated with hemostasis clip                            and epi. Friable mucosa in the proximal stomach  with contact oozing.                           - Red blood with active bleeding and clots in the                            duodenal bulb. 4 discrete areas of adherent clot                            with extremely friable tissue but no focal                            ulceration appreciated. One of these areas had a                            visible vessel which was bleeding, another lesion                            had active oozing. 2 areas treated as outlined                            above. 4 clips placed total in the duodenal bulb.                             Patient with lytic lesions noted on xray, unclear                            if he has underlying myeloma? These lesions appear                            to have high risk for relbeeding in the setting of                            coagulopathy. No active bleeding noted following                            endoscopic intervention.  Recommendation:           - Patient to remain in the ICU for ongoing care.                           - NPO.                           - Continue present medications including IV PPI                           - Serial Hgb                           - Further workup pending course. If he survives                            these acute issues, may need repeat EGD to  re-evaluate duodenal bulb following treatment with                            PPI and workup underlying liver disease  Antimicrobials:  Azithro 5/29 >> 5/31   Vanco 5/29 >> 5/30  Cefepime 5/30 >> 6/4    Subjective: No concerns today. No issues overnight.  Objective: Vitals:   01/27/19 1946 01/27/19 2042 01/28/19 0448 01/28/19 0846  BP: 103/67 (!) 90/57 (!) 90/50 (!) 87/54  Pulse: 82 85 73 90  Resp: (!) '22 12 20 18  ' Temp: 98.9 F (37.2 C) 98.8 F (37.1 C) 99 F (37.2 C) 98.5 F (36.9 C)  TempSrc: Oral Oral Oral Oral  SpO2: 96% 95% 97% 97%  Weight:   109.2 kg   Height:        Intake/Output Summary (Last 24 hours) at 01/28/2019 1337 Last data filed at 01/28/2019 0900 Gross per 24 hour  Intake 998 ml  Output 50 ml  Net 948 ml   Filed Weights   01/26/19 0500 01/27/19 0500 01/28/19 0448  Weight: 106.7 kg 105.7 kg 109.2 kg    Examination:  General exam: Appears calm and comfortable Respiratory system: Clear to auscultation. Respiratory effort normal. Cardiovascular system: S1 & S2 heard, RRR. No murmurs, rubs, gallops or clicks. Gastrointestinal system: Abdomen is nondistended, soft and nontender. No organomegaly or masses felt. Normal bowel  sounds heard. Central nervous system: Alert and oriented. No focal neurological deficits. Extremities: No edema. No calf tenderness Skin: No cyanosis. No rashes Psychiatry: Judgement and insight appear normal. Mood & affect appropriate.     Data Reviewed: I have personally reviewed following labs and imaging studies  CBC: Recent Labs  Lab 01/21/19 1702 01/22/19 0317 01/23/19 0500 01/23/19 1600 01/24/19 0500 01/25/19 0301 01/26/19 0406 01/27/19 1059  WBC 13.1* 13.3* 10.7*  --  10.3 11.9* 11.1* 8.2  NEUTROABS 8.0* 8.5*  --   --   --  7.3 8.0*  --   HGB 7.7* 7.6* 7.0* 7.6* 7.6* 7.5* 7.2* 7.3*  HCT 23.1* 22.7* 21.7* 23.8* 23.5* 23.5* 22.5* 22.9*  MCV 98.3 98.7 101.4*  --  100.4* 102.6* 103.7* 103.6*  PLT 94* 122* 106*  --  103* 105* 97* 94*   Basic Metabolic Panel: Recent Labs  Lab 01/23/19 0500  01/24/19 0500  01/25/19 0301 01/25/19 1955 01/26/19 0404 01/26/19 0406 01/26/19 1827 01/27/19 0524 01/27/19 0525 01/28/19 0830  NA 136   < > 135   < > 136 135 134*  --  135  --  136 135  K 4.8   < > 4.5   < > 4.6 5.1 5.0  --  5.0  --  4.8 4.6  CL 103   < > 103   < > 102 102 101  --  103  --  103 101  CO2 24   < > 25   < > '26 25 24  ' --  24  --  25 24  GLUCOSE 105*   < > 106*   < > 115* 122* 115*  --  117*  --  98 126*  BUN 37*   < > 23   < > '17 19 22  ' --  26*  --  27* 56*  CREATININE 2.14*   < > 1.90*   < > 1.93* 2.20* 2.21*  --  2.30*  --  2.15* 4.12*  CALCIUM 9.1   < > 8.9   < >  9.3 9.2 9.3  --  9.3  --  9.5 9.6  MG 2.6*  --  2.7*  --  2.6*  --   --  2.7*  --  2.7*  --   --   PHOS 3.2   < > 2.3*   < > 2.3* 2.9 3.1  --  3.4  --  3.1  --    < > = values in this interval not displayed.   GFR: Estimated Creatinine Clearance: 19.1 mL/min (A) (by C-G formula based on SCr of 4.12 mg/dL (H)). Liver Function Tests: Recent Labs  Lab 01/25/19 0301 01/25/19 1955 01/26/19 0404 01/26/19 1827 01/27/19 0525  ALBUMIN 3.6 3.5 3.6 3.7 3.6   No results for input(s): LIPASE, AMYLASE  in the last 168 hours. No results for input(s): AMMONIA in the last 168 hours. Coagulation Profile: No results for input(s): INR, PROTIME in the last 168 hours. Cardiac Enzymes: No results for input(s): CKTOTAL, CKMB, CKMBINDEX, TROPONINI in the last 168 hours. BNP (last 3 results) No results for input(s): PROBNP in the last 8760 hours. HbA1C: No results for input(s): HGBA1C in the last 72 hours. CBG: Recent Labs  Lab 01/21/19 2351 01/24/19 1234  GLUCAP 117* 113*   Lipid Profile: No results for input(s): CHOL, HDL, LDLCALC, TRIG, CHOLHDL, LDLDIRECT in the last 72 hours. Thyroid Function Tests: No results for input(s): TSH, T4TOTAL, FREET4, T3FREE, THYROIDAB in the last 72 hours. Anemia Panel: No results for input(s): VITAMINB12, FOLATE, FERRITIN, TIBC, IRON, RETICCTPCT in the last 72 hours. Sepsis Labs: No results for input(s): PROCALCITON, LATICACIDVEN in the last 168 hours.  Recent Results (from the past 240 hour(s))  MRSA PCR Screening     Status: None   Collection Time: 01/20/19  7:36 PM   Specimen: Nasal Mucosa; Nasopharyngeal  Result Value Ref Range Status   MRSA by PCR NEGATIVE NEGATIVE Final    Comment:        The GeneXpert MRSA Assay (FDA approved for NASAL specimens only), is one component of a comprehensive MRSA colonization surveillance program. It is not intended to diagnose MRSA infection nor to guide or monitor treatment for MRSA infections. Performed at [facility], [place] [address], [place], [place] [address]          Radiology Studies: No results found.      Scheduled Meds: . chlorhexidine gluconate (MEDLINE KIT)  15 mL Mouth Rinse BID  . Chlorhexidine Gluconate Cloth  6 each Topical Q0600  . feeding supplement (ENSURE ENLIVE)  237 mL Oral BID BM  . feeding supplement (PRO-STAT SUGAR FREE 64)  30 mL Oral Daily  . hydrocortisone sod succinate (SOLU-CORTEF) inj  50 mg Intravenous Q6H  . mouth rinse  15 mL Mouth Rinse  q12n4p  . midodrine  10 mg Oral TID WC  . nystatin   Topical TID  . pantoprazole  40 mg Oral BID AC  . polyethylene glycol  17 g Oral Daily   Continuous Infusions:   LOS: 14 days     Cordelia Poche, MD [facility] 01/28/2019, 1:37 PM  If 7PM-7AM, please contact night-coverage [email or web address]

## 2019-01-28 NOTE — Care Management Important Message (Signed)
Important Message  Patient Details  Name: Scott Mcdowell MRN: [id number] Date of Birth: Dec 30, 1940   Medicare Important Message Given:  Yes    [name] 01/28/2019, 1:46 PM

## 2019-01-28 NOTE — Consult Note (Signed)
Physical Medicine and Rehabilitation Consult   Reason for Consult: Debility.  Referring Physician: Dr. Lonny Prude   HPI: Scott Mcdowell is a 78 y.o. male with history of HTN, CVA with residual R-HP, morbid obesity who was admitted from [facility]/19 with reports of SOB X few weeks, moderate right pleural effusion with anasarc and ascites, scrotal edema with cellulitis, acute renal failure with positive troponins. He was started on broad spectrum antibiotics for CAP and underwent thoracocentesis of 600 cc of transudative pleural effusion on 5/30.  fluid.   Nephrology consulted AKI with volume overload and he was started on IV diuresis with poor response.  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  He was found to have right brachial DVT and started on IV heparin.  He developed significant hypotension with ABLA as well as maaron stools and required multiple units PRBC,  intubation as well as pressors.  NGT showed coffee ground emesis and he underwent EGD revealing blood in gastric body and duodenum with active bleeding and adherent clot in distal gastric body which was treated with hemostatic clip and epinephrine injections for hemostasis.  He tolerated extubation without difficulty and CRRT initiated. Case discussed with Dr. Earlie Server and and he underwent bone biopsy 6/9 by radiology--pathology pending.   He was cleared to start anticoagulation 6/10 as H/H stable. He has been weaned off pressors and to be transitioned to HD today due to lack of renal recovery and ongoing issues with overload.  Therapy ongoing and CIR recommended due to functional deficits. .   The patient states that he had a good recovery from his stroke and that he was independent without device and able to do all his dressing and bathing.   Review of Systems  Constitutional:  Negative for chills and fever.  HENT: Negative for hearing loss and tinnitus.   Eyes: Negative for blurred vision and double vision.  Respiratory: Negative for cough and shortness of breath.   Cardiovascular: Negative for chest pain and palpitations.  Gastrointestinal: Negative for abdominal pain, heartburn and nausea.  Musculoskeletal: Negative for myalgias.  Skin: Negative for rash.  Neurological: Positive for focal weakness. Negative for dizziness and headaches.    Past Medical History:  Diagnosis Date  . Stroke [facility])    w right sided weakness    Past Surgical History:  Procedure Laterality Date  . ESOPHAGOGASTRODUODENOSCOPY N/A 01/21/2019   Procedure: ESOPHAGOGASTRODUODENOSCOPY (EGD);  Surgeon: Yetta Flock, MD;  Location: [facility];  Service: Gastroenterology;  Laterality: N/A;  at bedside. PCCM intubating patient and he will be ready for EGD by 10: 30-10:40am  . HEMOSTASIS CLIP PLACEMENT  01/21/2019   Procedure: HEMOSTASIS CLIP PLACEMENT;  Surgeon: Yetta Flock, MD;  Location: [facility];  Service: Gastroenterology;;  . HEMOSTASIS CONTROL  01/21/2019   Procedure: HEMOSTASIS CONTROL;  Surgeon: Yetta Flock, MD;  Location: [facility];  Service: Gastroenterology;;  Girtha Rm Probe  . IR FLUORO GUIDE CV LINE RIGHT  01/20/2019  . IR US GUIDE VASC ACCESS RIGHT  01/20/2019  . SCLEROTHERAPY  01/21/2019   Procedure: SCLEROTHERAPY;  Surgeon: Yetta Flock, MD;  Location: [facility];  Service: Gastroenterology;;    Family History  Problem Relation Age of Onset  . Cancer Mother   . Parkinson's disease Father     Social History:  Married. 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  He  reports that he has quit smoking 2004. His smoking use included cigarettes. He has never used smokeless tobacco. He reports current alcohol use--one beer every other day.  No history on file for drug.    Allergies: No Known Allergies    Medications Prior to Admission   Medication Sig Dispense Refill  . aspirin 81 MG chewable tablet Chew 81 mg by mouth daily.    Marland Kitchen atorvastatin (LIPITOR) 10 MG tablet Take 10 mg by mouth daily.    . ramipril (ALTACE) 2.5 MG capsule Take 2.5 mg by mouth daily.      Home: Home Living Family/patient expects to be discharged to:: Unsure Living Arrangements: Spouse/significant other Available Help at Discharge: Family Type of Home: House Home Access: Stairs to enter [employer] of Steps: 3 (uses door/frame) to hold on to Entrance Stairs-Rails: None Home Layout: Two level, Able to live on main level with bedroom/bathroom Bathroom Toilet: Standard Home Equipment: Walker - 2 wheels, Cane - single point  Functional History: Prior Function Level of Independence: Independent with assistive device(s) Comments: uses RW vs cane PRN. "On a good day" pt can ambulate short distances unassisted.   Was able to write and use R hand functionally Functional Status:  Mobility: Bed Mobility Overal bed mobility: Needs Assistance Bed Mobility: Supine to Sit Rolling: Mod assist Supine to sit: Mod assist, +2 for physical assistance Sit to supine: Mod assist, +2 for physical assistance General bed mobility comments: assist for legs and to guide trunk Transfers Overall transfer level: Needs assistance Equipment used: 2 person hand held assist Transfers: Sit to/from Stand, Stand Pivot Transfers Sit to Stand: Mod assist, +2 physical assistance Stand pivot transfers: Mod assist, +2 physical assistance(SPT/squat pivot to L side) General transfer comment: assist to stand and stabilize. Pt unable to pivot to R side Ambulation/Gait Ambulation/Gait assistance: Min assist, +2 safety/equipment Gait Distance (Feet): 5 Feet Assistive device: Rolling walker (2 wheeled) Gait Pattern/deviations: Step-to pattern General Gait Details: side steps at edge of bed, then pivotal steps to recliner with RW, no buckling, distance limited by fatigue  Gait velocity: decr    ADL: ADL Overall ADL's : Needs assistance/impaired Eating/Feeding: Set up, Sitting Grooming: Minimal assistance, Bed level, Moderate assistance(lotion ) Upper Body Bathing: Moderate assistance Lower Body Bathing: Total assistance, +2 for physical assistance, Sit to/from stand(pt 10%) Upper Body Dressing : Moderate assistance, Maximal assistance Lower Body Dressing: Total assistance, +2 for physical assistance, Sit to/from stand Toilet Transfer: Moderate assistance, +2 for physical assistance, Stand-pivot, Squat-pivot(chair to bed, to L side) General ADL Comments: able to hold lotion with RUE as gross assist  Cognition: Cognition Overall Cognitive Status: Within Functional Limits for tasks assessed Orientation Level: Oriented X4 Cognition Arousal/Alertness: Awake/alert Behavior During Therapy: WFL for tasks assessed/performed Overall Cognitive Status: Within Functional Limits for tasks assessed  Blood pressure (!) 87/54, pulse 90, temperature 98.5 F (36.9 C), temperature source Oral, resp. rate 18, height 6\' 2"  (1.88 m), weight 109.2 kg, SpO2 97 %. Physical Exam  Nursing note and vitals reviewed. Constitutional: He is oriented to person, place, and time. He appears well-developed and well-nourished. No distress.  Cardiovascular: Normal rate, regular rhythm and normal heart sounds.  No murmur heard. Respiratory: Effort normal and breath sounds normal. No respiratory distress.  GI: Soft. Bowel sounds are normal. He exhibits no distension. There is no abdominal tenderness.  Musculoskeletal:        General: Edema present.  Neurological: He is alert and oriented to person, place, and time.  Speech  clear. Able to follow basic commands without difficulty. Right hemiparesis. Right inattention?  Skin: He is not diaphoretic.  Ecchymosis left forearm greater than right forearm There is edema in the right upper extremity greater than right lower extremity as well as  mild pretibial edema left lower extremity  Psychiatric: He has a normal mood and affect.  Motor: 3/5 in the right deltoid bicep tricep finger flexors and extensors 4+ in the left deltoid bicep tricep grip finger flexors and extensors 3+ in the right hip flexor knee extensor ankle dorsiflexor 4+ in the left hip flexor knee extensor ankle dorsiflexor Sensation is intact light touch bilateral upper and lower extremities. Speech without dysarthria or aphasia.  Results for orders placed or performed during the hospital encounter of 01/14/19 (from the past 24 hour(s))  Basic metabolic panel     Status: Abnormal   Collection Time: 01/28/19  8:30 AM  Result Value Ref Range   Sodium 135 135 - 145 mmol/L   Potassium 4.6 3.5 - 5.1 mmol/L   Chloride 101 98 - 111 mmol/L   CO2 24 22 - 32 mmol/L   Glucose, Bld 126 (H) 70 - 99 mg/dL   BUN 56 (H) 8 - 23 mg/dL   Creatinine, Ser 4.12 (H) 0.61 - 1.24 mg/dL   Calcium 9.6 8.9 - 10.3 mg/dL   GFR calc non Af Amer 13 (L) >60 mL/min   GFR calc Af Amer 15 (L) >60 mL/min   Anion gap 10 5 - 15   No results found.   Assessment/Plan: Diagnosis: Debility due to acute on chronic renal failure 1. Does the need for close, 24 hr/day medical supervision in concert with the patient's rehab needs make it unreasonable for this patient to be served in a less intensive setting? Yes 2. 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 Due to bladder management, bowel management, safety, skin/wound care, disease management, medication administration, pain management and patient education, does the patient require 24 hr/day rehab nursing? Yes 4. Does the patient require coordinated care of a physician, rehab nurse, PT (1-2 hrs/day, 5 days/week) and OT (1-2 hrs/day, 5 days/week) to address physical and functional deficits in the context of  the above medical diagnosis(es)? Yes Addressing deficits in the following areas: balance, endurance, locomotion, strength, transferring, bowel/bladder control, bathing, dressing, feeding, grooming, toileting and psychosocial support 5. Can the patient actively participate in an intensive therapy program of at least 3 hrs of therapy per day at least 5 days per week? Yes 6. The potential for patient to make measurable gains while on inpatient rehab is good 7. Anticipated functional outcomes upon discharge from inpatient rehab are supervision  with PT, supervision with OT, n/a with SLP. 8. Estimated rehab length of stay to reach the above functional goals is: 14 to 17 days 9. Anticipated D/C setting: Home 10. Anticipated post D/C treatments: [place] therapy 11. Overall Rehab/Functional Prognosis: good  RECOMMENDATIONS: This patient's condition is appropriate for continued rehabilitative care in the following setting: CIR Patient has agreed to participate in recommended program. Yes Note that insurance prior authorization may be required for reimbursement for recommended care.  Comment: We will need to know oncology plan once pathology results are available  "I have personally performed a face to face diagnostic evaluation of this patient.  Additionally, I have reviewed and concur with the physician assistant's documentation above." Charlett Blake M.D. [facility] FAAPM&R (Sports Med, Neuromuscular Med) Diplomate Am Board of [place], PA-C 01/28/2019

## 2019-01-28 NOTE — Progress Notes (Signed)
Patient ID: Scott Mcdowell, male   DOB: 12-Apr-1941, 78 y.o.   MRN: [id number]  [facility] Progress Note   Assessment/ Plan:   1. 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  Volume overload/ hypotension: sig edema upper and LE's - CXR clear 6/9 - UF as tol on HD  - tight fluid restriction 3.  Anemia d/t myeloma/ ABL/ acute GI bleed: - transfuse prn 4.  Right arm brachial vein DVT: - IV hep dc'd due to GIB 5.  Vitamin D deficiency: Continue cholecalciferol supplementation when stabilized 6.  Upper GI bleed:  - duodenal bulb region was treated endoscopically.  [name], MD 01/28/2019, 1:03 PM  CXR 6/9 - resolution of IS pattern/ edema  Subjective:   No new c/o's.     Objective:   BP (!) 87/54 (BP Location: Left Arm)   Pulse 90   Temp 98.5 F (36.9 C) (Oral)   Resp 18   Ht _0  (1.88 m)   Wt 109.2 kg   SpO2 97%   BMI 30.91 kg/m   Intake/Output Summary (Last 24 hours) at 01/28/2019 1303 Last data filed at 01/28/2019 0900 Gross per 24 hour  Intake 998 ml  Output 50 ml  Net 948 ml   Weight change: 3.5 kg  Physical Exam: Gen: Awake/alert, seen in room, R IJ temp cath CVS: Pulse regular rhythm, normal rate, S1-S2 normal Resp: Anteriorly clear to auscultation, no rales Abd: Soft, obese, nontender Ext: 2+ LE and UE edema  Imaging: No results found.  Labs: BMET Recent Labs  Lab 01/24/19 0500 01/24/19 1600 01/25/19 0301 01/25/19 1955 01/26/19 0404 01/26/19 1827 01/27/19 0525 01/28/19 0830  NA 135 134* 136 135 134* 135 136 135  K 4.5 4.9 4.6 5.1 5.0 5.0 4.8 4.6  CL 103 101 102 102 101 103 103 101  CO2 _1 GLUCOSE 106* 133* 115* 122* 115* 117* 98 126*  BUN _2 26* 27* 56*  CREATININE 1.90* 2.03* 1.93* 2.20* 2.21* 2.30* 2.15* 4.12*  CALCIUM 8.9 9.0 9.3 9.2 9.3 9.3 9.5 9.6   PHOS 2.3* 2.5 2.3* 2.9 3.1 3.4 3.1  --    CBC Recent Labs  Lab 01/21/19 1702 01/22/19 0317  01/24/19 0500 01/25/19 0301 01/26/19 0406 01/27/19 1059  WBC 13.1* 13.3*   < > 10.3 11.9* 11.1* 8.2  NEUTROABS 8.0* 8.5*  --   --  7.3 8.0*  --   HGB 7.7* 7.6*   < > 7.6* 7.5* 7.2* 7.3*  HCT 23.1* 22.7*   < > 23.5* 23.5* 22.5* 22.9*  MCV 98.3 98.7   < > 100.4* 102.6* 103.7* 103.6*  PLT 94* 122*   < > 103* 105* 97* 94*   < > = values in this interval not displayed.   Medications:    . chlorhexidine gluconate (MEDLINE KIT)  15 mL Mouth Rinse BID  . Chlorhexidine Gluconate Cloth  6 each Topical Q0600  . feeding supplement (ENSURE ENLIVE)  237 mL Oral BID BM  . feeding supplement (PRO-STAT SUGAR FREE 64)  30 mL Oral Daily  . hydrocortisone sod succinate (SOLU-CORTEF) inj  50 mg Intravenous Q6H  . mouth rinse  15 mL Mouth Rinse q12n4p  . midodrine  10 mg Oral TID WC  . nystatin   Topical  TID  . pantoprazole  40 mg Oral BID AC  . polyethylene glycol  17 g Oral Daily

## 2019-01-29 ENCOUNTER — Encounter (HOSPITAL_COMMUNITY): Payer: Self-pay | Admitting: Oncology

## 2019-01-29 DIAGNOSIS — R198 Other specified symptoms and signs involving the digestive system and abdomen: Secondary | ICD-10-CM

## 2019-01-29 DIAGNOSIS — I82621 Acute embolism and thrombosis of deep veins of right upper extremity: Secondary | ICD-10-CM

## 2019-01-29 DIAGNOSIS — D649 Anemia, unspecified: Secondary | ICD-10-CM

## 2019-01-29 DIAGNOSIS — R195 Other fecal abnormalities: Secondary | ICD-10-CM

## 2019-01-29 DIAGNOSIS — D696 Thrombocytopenia, unspecified: Secondary | ICD-10-CM

## 2019-01-29 DIAGNOSIS — C9000 Multiple myeloma not having achieved remission: Secondary | ICD-10-CM

## 2019-01-29 DIAGNOSIS — Z7189 Other specified counseling: Secondary | ICD-10-CM

## 2019-01-29 HISTORY — DX: Multiple myeloma not having achieved remission: C90.00

## 2019-01-29 HISTORY — DX: Other specified counseling: Z71.89

## 2019-01-29 LAB — BASIC METABOLIC PANEL
Anion gap: 11 (ref 5–15)
BUN: 38 mg/dL — ABNORMAL HIGH (ref 8–23)
CO2: 26 mmol/L (ref 22–32)
Calcium: 8.8 mg/dL — ABNORMAL LOW (ref 8.9–10.3)
Chloride: 99 mmol/L (ref 98–111)
Creatinine, Ser: 3 mg/dL — ABNORMAL HIGH (ref 0.61–1.24)
GFR calc Af Amer: 22 mL/min — ABNORMAL LOW (ref >60)
GFR calc non Af Amer: 19 mL/min — ABNORMAL LOW (ref >60)
Glucose, Bld: 117 mg/dL — ABNORMAL HIGH (ref 70–99)
Potassium: 4 mmol/L (ref 3.5–5.1)
Sodium: 136 mmol/L (ref 135–145)

## 2019-01-29 LAB — CBC
HCT: 21.6 % — ABNORMAL LOW (ref 39.0–52.0)
Hemoglobin: 6.8 g/dL — CL (ref 13.0–17.0)
MCH: 32.2 pg (ref 26.0–34.0)
MCHC: 31.5 g/dL (ref 30.0–36.0)
MCV: 102.4 fL — ABNORMAL HIGH (ref 80.0–100.0)
Platelets: 93 10*3/uL — ABNORMAL LOW (ref 150–400)
RBC: 2.11 MIL/uL — ABNORMAL LOW (ref 4.22–5.81)
RDW: 18.1 % — ABNORMAL HIGH (ref 11.5–15.5)
WBC: 7.1 10*3/uL (ref 4.0–10.5)
nRBC: 0.4 % — ABNORMAL HIGH (ref 0.0–0.2)

## 2019-01-29 LAB — PREPARE RBC (CROSSMATCH)

## 2019-01-29 LAB — ABO/RH: ABO/RH(D): O POS

## 2019-01-29 MED ORDER — PANTOPRAZOLE SODIUM 40 MG IV SOLR
40.0000 mg | Freq: Two times a day (BID) | INTRAVENOUS | Status: DC
  Administered 2019-01-29 – 2019-02-04 (×13): 40 mg via INTRAVENOUS
  Filled 2019-01-29 (×14): qty 40

## 2019-01-29 MED ORDER — CHLORHEXIDINE GLUCONATE CLOTH 2 % EX PADS: 6.0000 | MEDICATED_PAD | Freq: Every day | CUTANEOUS | Status: DC

## 2019-01-29 MED ORDER — SODIUM CHLORIDE 0.9% IV SOLUTION
Freq: Once | INTRAVENOUS | Status: AC
  Administered 2019-01-29: 10:00:00 via INTRAVENOUS

## 2019-01-29 MED ORDER — HEPARIN SODIUM (PORCINE) 1000 UNIT/ML IJ SOLN
INTRAMUSCULAR | Status: AC
  Filled 2019-01-29: qty 3

## 2019-01-29 NOTE — Progress Notes (Signed)
Daily Rounding Note  01/29/2019, 12:31 PM  LOS: 15 days   SUBJECTIVE:   Chief complaint: recurrent GIB and drifting Hgb, anemia  Initially  Admitted 5/28 with CAP.  Required CRRT for AKI in setting of circulatory shock.  Briefly intubated.  01/16/19 R thoracentesis (non-malignant cytology).  6/2 dx of R brachial vein DVT, started on Heparin.   Developed GIB and underwent EGD 01/21/19: adherent clot, friable mucosa distal gastric body, area clipped.  Discrete areas of active bleeding, friable tissue, adherent clots in duodenal bulb; 1 area with bleeding VV, another with oozing.  These areas were treated with endoclips.  High risk for rebleeding.   On BID Protonix.  IV drip then scheduled,  PO as of 6/8.   No anticoagulation since Heparin stopped on 6/3.     Ultrasound 5/31: subtle nodularity of liver.  Peri-hepatic, peri-splenic ascites.  Normal LFTs.   Bone marrow biopsy (for lytic rib lesion on CXR, anemia, thrombocytopenia) revealed plasma cell myeloma.        No subsequent overt GI bleeding.  Small dark stools in last few days.   Hgb nadir of 4.8  01/20/19, up to max 7.6 on 6/7 but drifting since, 6.8 today.  Macrocytic.  Persistent hypotension, not tachycardic.     Patient feels well, he has been tolerating solid meals.  His stools are dark but quite small, he had a smear of dark stool earlier today.  No abdominal pain.  No nausea or vomiting.  Does have hematomas, extensive purpura in both upper extremities.  Overall he feels great.  Physically he is quite limited.  He can stand to transfer from bed to bedside chair with assistance but has not been ambulating.   OBJECTIVE:         Vital signs in last 24 hours:    Temp:  [97.7 F (36.5 C)-98.3 F (36.8 C)] 97.8 F (36.6 C) (06/12 0945) Pulse Rate:  [60-89] 86 (06/12 1217) Resp:  [18-19] 18 (06/12 1217) BP: (79-115)/(4-62) 85/49 (06/12 1217) SpO2:  [96 %-98 %] 98 % (06/12 1217)  Weight:  [106.8 kg-108 kg] 106.8 kg (06/12 0022) Last BM Date: 01/28/19 Filed Weights   01/28/19 0448 01/28/19 2040 01/29/19 0022  Weight: 109.2 kg 108 kg 106.8 kg   General: Looks chronically unwell, comfortable Heart: RRR. Chest: Clear bilaterally. Abdomen: Soft, active bowel sounds.  Not tender or distended.  No HSM, bruits, masses. Extremities: Swelling in right upper and right lower extremity, pitting in the top of the right foot Neuro/Psych: Pleasant.  Calm, cooperative.  Oriented x3.  Right upper >> right lower extremity weakness.    Intake/Output from previous day: 06/11 0701 - 06/12 0700 In: 1340 [P.O.:1320; I.V.:20] Out: 1380   Intake/Output this shift: Total I/O In: 440 [P.O.:440] Out: -   Lab Results: Recent Labs    01/27/19 1059 01/29/19 0336  WBC 8.2 7.1  HGB 7.3* 6.8*  HCT 22.9* 21.6*  PLT 94* 93*   BMET Recent Labs    01/27/19 0525 01/28/19 0830 01/29/19 0336  NA 136 135 136  K 4.8 4.6 4.0  CL 103 101 99  CO2 _0 GLUCOSE 98 126* 117*  BUN 27* 56* 38*  CREATININE 2.15* 4.12* 3.00*  CALCIUM 9.5 9.6 8.8*   LFT Recent Labs    01/26/19 1827 01/27/19 0525  ALBUMIN 3.7 3.6   PT/INR No results for input(s): LABPROT, INR in the last 72 hours.  ASSESMENT:   *  Recurrent GIB:  Melena.   EGD 01/21/19: adherent clot, friable mucosa distal gastric body, area clipped.  Discrete areas of active bleeding, friable tissue, adherent clots in duodenal bulb; 1 area with bleeding VV, another with oozing.  These areas were treated with endoclips.  High risk for rebleeding.   On BID Protonix.  IV drip then scheduled,  PO as of 6/8.    *   R UE DVT.  Last got Heparin 6/3.     *   Thrombocytopenia.  Overall stable.    *  Plasma cell myeloma.    *    Blood loss anemia.   Has received Feraheme 01/19/19.   8 U  PRBCs scattered throughout admission, 1 of these yest, 1 today.    *   AKI.  CRRT >> intermittent HD.    *   ? Cirrhosis.   Ultrasound 5/31:  subtle nodularity of liver.  Peri-hepatic, peri-splenic ascites.  No PT/INR, PTT @ admission, INR slightly elevated but in setting of Heparin.  +Thrombocytopenia.    PLAN   *   Convert back to Protonix 40 mg IV twice daily  *    Since he has just completed lunch of solid food, delay upper endoscopy until tomorrow, n.p.o. after midnight    Azucena Freed  01/29/2019, 12:31 PM Phone [phone]

## 2019-01-29 NOTE — Progress Notes (Signed)
CRITICAL VALUE ALERT  Critical Value:  Hemoglobin 6.8  Date & Time Notied:  01/29/2019 @04 :45  Provider Notified: Maryjane Hurter 01/29/2019 @ 0:504  Orders Received/Actions taken: Order received in epic

## 2019-01-29 NOTE — Consult Note (Addendum)
[place]  Telephone:(336) [phone] Fax:(336) [phone]   [place]  Referral MD: Dr. 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  The patient was sent to the emergency room for evaluation by his primary care provider secondary to scrotal swelling and possible cellulitis.  The patient was initially admitted with community-acquired pneumonia, acute renal failure, hypercalcemia, anemia, and ascites.  He has required CRRT for AKI in setting of circulatory shock and is now currently on hemodialysis.  Briefly intubated.    On 01/16/19 he underwent a R thoracentesis (non-malignant cytology).  On 6/2 dx of R brachial vein DVT, started on Heparin. Developed GIB and underwent EGD 01/21/19: adherent clot, friable mucosa distal gastric body, area clipped.  Discrete areas of active bleeding, friable tissue, adherent clots in duodenal bulb; 1 area with bleeding VV, another with oozing.  These areas were treated with endoclips.  High risk for rebleeding.  He is currently off anticoagulation due to high risk for bleeding.  The patient was found to have a lytic rib lesion which was concerning for possible malignancy.  A multiple myeloma panel and bone marrow biopsy have been performed.  Labs drawn today include: 01/17/2019 LDH elevated at 263, free lambda light chains in the urine were elevated at 1984.48, serum kappa free light chain elevated at 27.9, serum lambda free light chains elevated at 129.9, kappa/lambda light chain ratio 0.21, no M spike was observed on the serum protein electrophoresis.  On 01/23/2019 his IgG was 477, IgA 37, IgM 11.  On 01/26/2019 he underwent a bone marrow biopsy which showed hypercellular marrow involved by plasma cell neoplasm (90%).  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  To date, the patient has received 6 units of packed red blood cells, 1 unit of platelets, and 3 units of fresh frozen plasma.  When seen today, the patient reports that he is feeling better.  He still has a lot of fatigue and weakness.  He has developed melena again and GI has been reconsulted.  Prior to his admission, he reports that he was feeling quite well.  He denies having any headaches, dizziness, anorexia, weight loss, night sweats.  Denies chest discomfort, shortness of breath, cough.  He denied any other bleeding such as epistaxis, hemoptysis, hematemesis.  Denies abdominal pain, nausea, vomiting, constipation, diarrhea.  He has had some lower extremity edema which has been ongoing.  He has right-sided weakness secondary to prior CVA in 2004.  However, he reports that he was completely independent with ADLs and IADLs.  Oncology was asked see the patient to make recommendations regarding his newly diagnosed plasma cell myeloma.   Past Medical History:  Diagnosis Date  . HTN (hypertension)   . Stroke [facility]) 2004   w right sided weakness  :  Past Surgical History:  Procedure Laterality Date  . ESOPHAGOGASTRODUODENOSCOPY N/A 01/21/2019   Procedure: ESOPHAGOGASTRODUODENOSCOPY (EGD);  Surgeon: Yetta Flock, MD;  Location: [facility];  Service: Gastroenterology;  Laterality: N/A;  at bedside. PCCM intubating patient and he will be ready for EGD by 10: 30-10:40am  . HEMOSTASIS CLIP PLACEMENT  01/21/2019   Procedure: HEMOSTASIS CLIP PLACEMENT;  Surgeon: Yetta Flock, MD;  Location: [facility];  Service: Gastroenterology;;  . HEMOSTASIS CONTROL  01/21/2019   Procedure: HEMOSTASIS CONTROL;  Surgeon: Yetta Flock, MD;  Location: [facility];  Service: Gastroenterology;;  Girtha Rm Probe  . IR FLUORO GUIDE CV LINE RIGHT  01/20/2019  . IR US GUIDE VASC ACCESS RIGHT  01/20/2019  . SCLEROTHERAPY  01/21/2019   Procedure: SCLEROTHERAPY;  Surgeon:  Yetta Flock, MD;  Location: [facility];  Service: Gastroenterology;;  :  Current Facility-Administered Medications  Medication Dose Route Frequency Provider Last Rate Last Dose  . chlorhexidine gluconate (MEDLINE KIT) (PERIDEX) 0.12 % solution 15 mL  15 mL Mouth Rinse BID Chesley Mires, MD   15 mL at 01/28/19 0800  . Chlorhexidine Gluconate Cloth 2 % PADS 6 each  6 each Topical Q0600 Chesley Mires, MD   6 each at 01/29/19 [phone]  . feeding supplement (ENSURE ENLIVE) (ENSURE ENLIVE) liquid 237 mL  237 mL Oral BID BM Chesley Mires, MD   237 mL at 01/29/19 1032  . feeding supplement (PRO-STAT SUGAR FREE 64) liquid 30 mL  30 mL Oral Daily Halford Chessman, Vineet, MD   30 mL at 01/28/19 1320  . hydrocortisone sodium succinate (SOLU-CORTEF) 100 MG injection 50 mg  50 mg Intravenous Q12H Mariel Aloe, MD   50 mg at 01/29/19 1023  . MEDLINE mouth rinse  15 mL Mouth Rinse q12n4p Chesley Mires, MD   15 mL at 01/28/19 1722  . midodrine (PROAMATINE) tablet 10 mg  10 mg Oral TID WC Chesley Mires, MD   10 mg at 01/29/19 1023  . nystatin (MYCOSTATIN/NYSTOP) topical powder   Topical TID Chesley Mires, MD      . pantoprazole (PROTONIX) EC tablet 40 mg  40 mg Oral BID AC Chesley Mires, MD   40 mg at 01/29/19 1023  . polyethylene glycol (MIRALAX / GLYCOLAX) packet 17 g  17 g Oral Daily Chesley Mires, MD   17 g at 01/29/19 1023  . sodium chloride (OCEAN) 0.65 % nasal spray 1 spray  1 spray Each Nare PRN Chesley Mires, MD   1 spray at 01/20/19 0957  . sodium chloride flush (NS) 0.9 % injection 10-40 mL  10-40 mL Intracatheter PRN Chesley Mires, MD   10 mL at 01/29/19 0346     No Known Allergies:  Family History  Problem Relation Age of Onset  . Cancer Mother   . Parkinson's disease Father   :  Social History   Socioeconomic History  . Marital status: Married    Spouse name: Not on file  . Number of children: Not on file  . Years of education: Not on file  . Highest education level: Not on file  Occupational  History  . Not on file  Social Needs  . Financial resource strain: Not on file  . Food insecurity    Worry: Not on file    Inability: Not on file  . Transportation needs    Medical: Not on file    Non-medical: Not on file  Tobacco Use  . Smoking status: Former Smoker    Types: Cigarettes  . Smokeless tobacco: Never Used  Substance and Sexual Activity  . Alcohol use: Yes    Alcohol/week: 5.0 standard drinks    Types: 5 Cans of beer per week  . Drug use: Not on file  . Sexual activity: Not on file  Lifestyle  . Physical activity    Days per week: Not on file    Minutes per session: Not on file  . Stress: Not on file  Relationships  . Social connections    Talks  on phone: Not on file    Gets together: Not on file    Attends religious service: Not on file    Active member of club or organization: Not on file    Attends meetings of clubs or organizations: Not on file    Relationship status: Not on file  . Intimate partner violence    Fear of current or ex partner: Not on file    Emotionally abused: Not on file    Physically abused: Not on file    Forced sexual activity: Not on file  Other Topics Concern  . Not on file  Social History Narrative  . Not on file  : Review of Systems: A comprehensive 14 point review of systems was negative except as noted in the HPI.  Exam: Patient Vitals for the past 24 hrs:  BP Temp Temp src Pulse Resp SpO2 Weight  01/29/19 0945 (!) 93/57 97.8 F (36.6 C) Oral 89 18 97 % -  01/29/19 0908 (!) 81/55 98.1 F (36.7 C) Oral 85 18 97 % -  01/29/19 0451 (!) 89/55 98.2 F (36.8 C) Oral 70 19 98 % -  01/29/19 0052 (!) 83/55 98.3 F (36.8 C) Oral 77 19 97 % -  01/29/19 0042 (!) 83/56 - - 71 - - -  01/29/19 0022 (!) 83/50 97.7 F (36.5 C) Oral 73 18 98 % 235 lb 7.2 oz (106.8 kg)  01/29/19 0000 (!) 80/45 - - 65 - - -  01/28/19 2330 (!) 79/51 - - 67 - - -  01/28/19 2300 (!) 84/46 - - 70 - - -  01/28/19 2230 (!) 85/4 - - 70 - - -  01/28/19  2200 91/62 - - 82 - - -  01/28/19 2130 (!) 90/52 - - 70 - - -  01/28/19 2100 (!) 88/57 - - 72 - - -  01/28/19 2052 (!) 90/52 - - 61 - - -  01/28/19 2040 (!) 115/56 98.2 F (36.8 C) Oral 60 18 - 238 lb 1.6 oz (108 kg)  01/28/19 1829 (!) 91/54 - - 75 - 96 % -  01/28/19 1652 (!) 81/52 98 F (36.7 C) Oral 61 18 97 % -    General:  well-nourished in no acute distress.   Eyes:  no scleral icterus.   ENT:  There were no oropharyngeal lesions.  Neck was without thyromegaly.  Lymphatics:  Negative cervical, supraclavicular or axillary adenopathy.   Respiratory: lungs were clear bilaterally without wheezing or crackles.   Cardiovascular:  Regular rate and rhythm, S1/S2, without murmur, rub or gallop.  The patient has 1+ bilateral lower extremity edema.  Right arm edematous. GI:  abdomen was soft, flat, nontender, nondistended, without organomegaly.  Muscoloskeletal:  no spinal tenderness of palpation of vertebral spine.  Decreased strength in his right upper and lower extremity. Skin exam with multiple bruises to his lateral arms.   Neuro exam was nonfocal.  Patient was alert and oriented.  Attention was good.   Language was appropriate.  Mood was normal without depression.  Speech was not pressured.  Thought content was not tangential.     Lab Results  Component Value Date   WBC 7.1 01/29/2019   HGB 6.8 (LL) 01/29/2019   HCT 21.6 (L) 01/29/2019   PLT 93 (L) 01/29/2019   GLUCOSE 117 (H) 01/29/2019   ALT 25 01/18/2019   AST 26 01/18/2019   NA 136 01/29/2019   K 4.0 01/29/2019   CL 99 01/29/2019   CREATININE  3.00 (H) 01/29/2019   BUN 38 (H) 01/29/2019   CO2 26 01/29/2019    Dg Chest 1 View  Result Date: 01/16/2019 CLINICAL DATA:  Post thoracentesis EXAM: CHEST  1 VIEW COMPARISON:  01/14/2019 FINDINGS: Normal heart size. Low volumes. Bibasilar atelectasis. There is no pneumothorax post right thoracentesis. IMPRESSION: No pneumothorax post right thoracentesis. Electronically Signed   By:  Marybelle Killings M.D.   On: 01/16/2019 14:42   Dg Abd 1 View  Result Date: 01/20/2019 CLINICAL DATA:  NG tube positioning EXAM: ABDOMEN - 1 VIEW COMPARISON:  None. FINDINGS: The NG tube tip projects over the gastric antrum. The tip is pointed distally. The bowel gas pattern is nonspecific and nonobstructive. IMPRESSION: NG tube tip projects over the gastric antrum. The tip is pointed distally. Electronically Signed   By: Constance Holster M.D.   On: 01/20/2019 22:37   US Abdomen Complete  Result Date: 01/17/2019 CLINICAL DATA:  Chronic kidney disease EXAM: ABDOMEN ULTRASOUND COMPLETE COMPARISON:  CT 01/14/2019 FINDINGS: Gallbladder: Gallbladder wall is mildly thickened at 5 mm. No visible stones or sonographic Murphy's sign. Common bile duct: Diameter: Normal caliber, 3 mm Liver: Coarsened echotexture and nodular contours of the liver suggests cirrhosis. No focal hepatic abnormality. Portal vein is patent on color Doppler imaging with normal direction of blood flow towards the liver. IVC: Prominent IVC and and hepatic veins which can be seen with right heart failure. Pancreas: Visualized portion unremarkable. Spleen: Size and appearance within normal limits. Right Kidney: Length: 12.3 cm. Echogenicity within normal limits. No mass or hydronephrosis. Left Kidney: Length: 11.5 cm. Echogenicity within normal limits. No mass or hydronephrosis. Abdominal aorta: No aneurysm visualized. Other findings: Ascites noted around the liver and spleen. IMPRESSION: Heterogeneous echotexture throughout the liver with subtle nodularity of the contours suggesting cirrhosis. Gallbladder wall thickening. Burtis Junes this is related to liver disease. Perihepatic and perisplenic ascites. Mildly dilated IVC and hepatic veins can be seen with right heart failure. Electronically Signed   By: Rolm Baptise M.D.   On: 01/17/2019 19:25   Ir Fluoro Guide Cv Line Right  Result Date: 01/20/2019 INDICATION: Renal insufficiency. In need of  intravenous access for the initiation of dialysis. EXAM: NON-TUNNELED CENTRAL VENOUS HEMODIALYSIS CATHETER PLACEMENT WITH ULTRASOUND AND FLUOROSCOPIC GUIDANCE COMPARISON:  Chest CT-01/14/2019 MEDICATIONS: None FLUOROSCOPY TIME:  24 seconds (10 mGy) COMPLICATIONS: None immediate. PROCEDURE: Informed written consent was obtained from the patient after a discussion of the risks, benefits, and alternatives to treatment. Questions regarding the procedure were encouraged and answered. The right neck and chest were prepped with chlorhexidine in a sterile fashion, and a sterile drape was applied covering the operative field. Maximum barrier sterile technique with sterile gowns and gloves were used for the procedure. A timeout was performed prior to the initiation of the procedure. After the overlying soft tissues were anesthetized, a small venotomy incision was created and a micropuncture kit was utilized to access the internal jugular vein. Real-time ultrasound guidance was utilized for vascular access including the acquisition of a permanent ultrasound image documenting patency of the accessed vessel. The microwire was utilized to measure appropriate catheter length. A stiff glidewire was advanced to the level of the IVC. Under fluoroscopic guidance, the venotomy was serially dilated, ultimately allowing placement of a 20 cm temporary Trialysis catheter with tip ultimately terminating within the superior aspect of the right atrium. Final catheter positioning was confirmed and documented with a spot radiographic image. The catheter aspirates and flushes normally. The catheter was flushed with  appropriate volume heparin dwells. The catheter exit site was secured with a 0-Prolene retention suture. A dressing was placed. The patient tolerated the procedure well without immediate post procedural complication. IMPRESSION: Successful placement of a right internal jugular approach 20 cm temporary dialysis catheter with tip  terminating with in the superior aspect of the right atrium. The catheter is ready for immediate use. PLAN: This catheter may be converted to a tunneled dialysis catheter at a later date as indicated. Electronically Signed   By: Sandi Mariscal M.D.   On: 01/20/2019 17:24   Ir US Guide Vasc Access Right  Result Date: 01/21/2019 INDICATION: Renal insufficiency. In need of intravenous access for the initiation of dialysis. EXAM: NON-TUNNELED CENTRAL VENOUS HEMODIALYSIS CATHETER PLACEMENT WITH ULTRASOUND AND FLUOROSCOPIC GUIDANCE COMPARISON:  Chest CT-01/14/2019 MEDICATIONS: None FLUOROSCOPY TIME:  24 seconds (10 mGy) COMPLICATIONS: None immediate. PROCEDURE: Informed written consent was obtained from the patient after a discussion of the risks, benefits, and alternatives to treatment. Questions regarding the procedure were encouraged and answered. The right neck and chest were prepped with chlorhexidine in a sterile fashion, and a sterile drape was applied covering the operative field. Maximum barrier sterile technique with sterile gowns and gloves were used for the procedure. A timeout was performed prior to the initiation of the procedure. After the overlying soft tissues were anesthetized, a small venotomy incision was created and a micropuncture kit was utilized to access the internal jugular vein. Real-time ultrasound guidance was utilized for vascular access including the acquisition of a permanent ultrasound image documenting patency of the accessed vessel. The microwire was utilized to measure appropriate catheter length. A stiff glidewire was advanced to the level of the IVC. Under fluoroscopic guidance, the venotomy was serially dilated, ultimately allowing placement of a 20 cm temporary Trialysis catheter with tip ultimately terminating within the superior aspect of the right atrium. Final catheter positioning was confirmed and documented with a spot radiographic image. The catheter aspirates and flushes  normally. The catheter was flushed with appropriate volume heparin dwells. The catheter exit site was secured with a 0-Prolene retention suture. A dressing was placed. The patient tolerated the procedure well without immediate post procedural complication. IMPRESSION: Successful placement of a right internal jugular approach 20 cm temporary dialysis catheter with tip terminating with in the superior aspect of the right atrium. The catheter is ready for immediate use. PLAN: This catheter may be converted to a tunneled dialysis catheter at a later date as indicated. Electronically Signed   By: Sandi Mariscal M.D.   On: 01/20/2019 17:24   Ct Biopsy  Result Date: 01/26/2019 CLINICAL DATA:  Thrombocytopenia and anemia. Bone marrow biopsy requested for further workup. EXAM: CT GUIDED BONE MARROW ASPIRATION AND BIOPSY ANESTHESIA/SEDATION: Versed 2.0 mg IV, Fentanyl 100 mcg IV Total Moderate Sedation Time:   10 minutes. The patient's level of consciousness and physiologic status were continuously monitored during the procedure by Radiology nursing. PROCEDURE: The procedure risks, benefits, and alternatives were explained to the patient. Questions regarding the procedure were encouraged and answered. The patient understands and consents to the procedure. A time out was performed prior to initiating the procedure. The right gluteal region was prepped with chlorhexidine. Sterile gown and sterile gloves were used for the procedure. Local anesthesia was provided with 1% Lidocaine. Under CT guidance, an 11 gauge On Control bone cutting needle was advanced from a posterior approach into the right iliac bone. Needle positioning was confirmed with CT. Initial non heparinized and heparinized aspirate samples  were obtained of bone marrow. Core biopsy was performed via the On Control drill needle. COMPLICATIONS: None FINDINGS: Inspection of initial aspirate did reveal visible particles. Intact core biopsy sample was obtained.  IMPRESSION: CT guided bone marrow biopsy of right posterior iliac bone with both aspirate and core samples obtained. Electronically Signed   By: Aletta Edouard M.D.   On: 01/26/2019 09:21   Dg Chest Port 1 View  Result Date: 01/26/2019 CLINICAL DATA:  Pulmonary edema, shortness of breath EXAM: PORTABLE CHEST 1 VIEW COMPARISON:  01/23/2019 FINDINGS: Right Vas-Cath, left central line remain in place, unchanged. Cardiomegaly with vascular congestion. Improving bilateral interstitial and airspace opacities, likely improving edema. Focal right lower lobe opacity. Possible small effusions. IMPRESSION: Improving diffuse interstitial and airspace opacities, likely improving edema. Focal right lower lobe infiltrate. Suspect small effusions. Electronically Signed   By: Rolm Baptise M.D.   On: 01/26/2019 09:30   Dg Chest Port 1 View  Result Date: 01/23/2019 CLINICAL DATA:  Acute respiratory failure EXAM: PORTABLE CHEST 1 VIEW COMPARISON:  Chest radiograph 01/22/2019 FINDINGS: Central venous catheter tip projects over the superior vena cava. Dual lumen central venous catheter tip projects over the superior vena cava. Monitoring leads overlie the patient. Stable cardiomegaly. Aortic atherosclerosis. Interval extubation. Low lung volumes. Layering right pleural effusion. Diffuse bilateral interstitial opacities, mildly increased, right greater than left. No pneumothorax. IMPRESSION: Interval extubation. Persistent layering right pleural effusion. Mild increased bilateral airspace opacities, right greater than left. Layering right pleural effusion. Electronically Signed   By: Lovey Newcomer M.D.   On: 01/23/2019 05:42   Portable Chest Xray  Result Date: 01/22/2019 CLINICAL DATA:  Endotracheal tube present. EXAM: PORTABLE CHEST 1 VIEW COMPARISON:  Radiograph January 21, 2019. FINDINGS: Stable cardiomegaly. Endotracheal tube is in good position. Right internal jugular dialysis catheter is unchanged. Left internal jugular catheter  is unchanged. No pneumothorax is noted. Left lung is clear. Stable right basilar atelectasis or infiltrate is noted with associated pleural effusion. Bony thorax is unremarkable. IMPRESSION: Stable support apparatus. Stable right basilar opacity as described above. Aortic Atherosclerosis (ICD10-I70.0). Electronically Signed   By: Marijo Conception M.D.   On: 01/22/2019 07:48   Dg Chest Port 1 View  Result Date: 01/21/2019 CLINICAL DATA:  Encounter for central line placement. EXAM: PORTABLE CHEST 1 VIEW COMPARISON:  Radiograph of January 21, 2019. FINDINGS: Mild cardiomegaly is noted. Endotracheal tube is in grossly good position. Nasogastric tube has been removed. No pneumothorax is noted. Stable position of right internal jugular dialysis catheter with distal tip in expected position of right atrium. Interval placement of left internal jugular catheter with distal tip in expected position of the SVC. Left lung is clear. Mild right basilar atelectasis and small pleural effusion is noted. Bony thorax is unremarkable. IMPRESSION: Interval placement of left internal jugular catheter with distal tip in expected position of SVC. Nasogastric tube has been removed. Otherwise stable support apparatus. No pneumothorax is noted. Mild right basilar atelectasis and effusion is noted. Electronically Signed   By: Marijo Conception M.D.   On: 01/21/2019 16:21   Portable Chest X-ray  Result Date: 01/21/2019 CLINICAL DATA:  Endotracheal tube EXAM: PORTABLE CHEST 1 VIEW COMPARISON:  Yesterday FINDINGS: Endotracheal tube tip between the clavicular heads and carina. Orogastric tube reaches the stomach. Dialysis catheter with tip at the upper cavoatrial junction. Cardiomegaly, stable. Haziness of the right more than left lower chest attributed atelectasis and pleural fluid. Right lateral fifth rib lucent lesion that appears aggressive on prior  imaging. These results were called by telephone at the time of interpretation on 01/21/2019 at  11:05 am to Dr. Noe Gens , who verbally acknowledged these results. IMPRESSION: 1. New endotracheal and orogastric tubes in expected position. Mildly improved lung volumes. 2. Lytic lesion in the lateral right fifth rib needing workup for malignancy Electronically Signed   By: Monte Fantasia M.D.   On: 01/21/2019 11:06   Dg Chest Port 1 View  Result Date: 01/20/2019 CLINICAL DATA:  Central line placement EXAM: PORTABLE CHEST 1 VIEW COMPARISON:  01/16/2019 FINDINGS: There is a well-positioned non tunneled right-sided central venous catheter with tip projecting over the right atrium. The cardiac silhouette is enlarged. There is no pneumothorax. There is a moderate sized right-sided pleural effusion. There is a small left-sided pleural effusion. There is generalized volume overload with evidence of pulmonary edema. IMPRESSION: 1. Well-positioned right-sided dialysis catheter.  No pneumothorax. 2. Cardiomegaly with volume overload including small to moderate-sized bilateral pleural effusions. Electronically Signed   By: Constance Holster M.D.   On: 01/20/2019 20:15   Ct Bone Marrow Biopsy & Aspiration  Result Date: 01/26/2019 CLINICAL DATA:  Thrombocytopenia and anemia. Bone marrow biopsy requested for further workup. EXAM: CT GUIDED BONE MARROW ASPIRATION AND BIOPSY ANESTHESIA/SEDATION: Versed 2.0 mg IV, Fentanyl 100 mcg IV Total Moderate Sedation Time:   10 minutes. The patient's level of consciousness and physiologic status were continuously monitored during the procedure by Radiology nursing. PROCEDURE: The procedure risks, benefits, and alternatives were explained to the patient. Questions regarding the procedure were encouraged and answered. The patient understands and consents to the procedure. A time out was performed prior to initiating the procedure. The right gluteal region was prepped with chlorhexidine. Sterile gown and sterile gloves were used for the procedure. Local anesthesia was provided  with 1% Lidocaine. Under CT guidance, an 11 gauge On Control bone cutting needle was advanced from a posterior approach into the right iliac bone. Needle positioning was confirmed with CT. Initial non heparinized and heparinized aspirate samples were obtained of bone marrow. Core biopsy was performed via the On Control drill needle. COMPLICATIONS: None FINDINGS: Inspection of initial aspirate did reveal visible particles. Intact core biopsy sample was obtained. IMPRESSION: CT guided bone marrow biopsy of right posterior iliac bone with both aspirate and core samples obtained. Electronically Signed   By: Aletta Edouard M.D.   On: 01/26/2019 09:21   Vas [place] Upper Extremity Venous Duplex  Result Date: 01/19/2019 UPPER VENOUS STUDY  Indications: Swelling Performing Technologist: Maudry Mayhew MHA, RDMS, RVT, RDCS  Examination Guidelines: A complete evaluation includes B-mode imaging, spectral Doppler, color Doppler, and power Doppler as needed of all accessible portions of each vessel. Bilateral testing is considered an integral part of a complete examination. Limited examinations for reoccurring indications may be performed as noted.  Right Findings: +----------+------------+---------+-----------+----------+-------+ RIGHT     CompressiblePhasicitySpontaneousPropertiesSummary +----------+------------+---------+-----------+----------+-------+ IJV           Full       Yes       Yes                      +----------+------------+---------+-----------+----------+-------+ Subclavian    Full       Yes       Yes                      +----------+------------+---------+-----------+----------+-------+ Axillary      Full       Yes  Yes                      +----------+------------+---------+-----------+----------+-------+ Brachial      None                 No                Acute  +----------+------------+---------+-----------+----------+-------+ Radial        Full                                           +----------+------------+---------+-----------+----------+-------+ Ulnar         Full                                          +----------+------------+---------+-----------+----------+-------+ Cephalic      Full                                          +----------+------------+---------+-----------+----------+-------+ Basilic       Full                                          +----------+------------+---------+-----------+----------+-------+  Left Findings: +----------+------------+---------+-----------+----------+-------+ LEFT      CompressiblePhasicitySpontaneousPropertiesSummary +----------+------------+---------+-----------+----------+-------+ Subclavian               Yes       Yes                      +----------+------------+---------+-----------+----------+-------+  Summary:  Right: No evidence of superficial vein thrombosis in the upper extremity. Findings consistent with acute deep vein thrombosis involving the right brachial veins.  Left: No evidence of thrombosis in the subclavian.  *See table(s) above for measurements and observations.  Diagnosing physician: Deitra Mayo MD Electronically signed by Deitra Mayo MD on 01/19/2019 at 4:52:34 PM.    Final    US Thoracentesis Asp Pleural Space W/img Guide  Result Date: 01/16/2019 INDICATION: Shortness of breath. Possible right lower lobe pneumonia. Right pleural effusion. Request for diagnostic and therapeutic thoracentesis. EXAM: ULTRASOUND GUIDED RIGHT THORACENTESIS MEDICATIONS: 1% lidocaine 10 mL COMPLICATIONS: None immediate. PROCEDURE: An ultrasound guided thoracentesis was thoroughly discussed with the patient and questions answered. The benefits, risks, alternatives and complications were also discussed. The patient understands and wishes to proceed with the procedure. Written consent was obtained. Ultrasound was performed to localize and mark an adequate pocket of fluid  in the right chest. The area was then prepped and draped in the normal sterile fashion. 1% Lidocaine was used for local anesthesia. Under ultrasound guidance a 6 Fr Safe-T-Centesis catheter was introduced. Thoracentesis was performed. The catheter was removed and a dressing applied. FINDINGS: A total of approximately 600 mL of clear yellow fluid was removed. Samples were sent to the laboratory as requested by the clinical team. IMPRESSION: Successful ultrasound guided right thoracentesis yielding 600 mL of pleural fluid. No pneumothorax on post-procedure chest x-ray. Read by: Gareth Eagle, PA-C Electronically Signed   By: Jerilynn Mages.  Shick M.D.   On: 01/16/2019 14:54    Dg Chest 1 View  Result Date:  01/16/2019 CLINICAL DATA:  Post thoracentesis EXAM: CHEST  1 VIEW COMPARISON:  01/14/2019 FINDINGS: Normal heart size. Low volumes. Bibasilar atelectasis. There is no pneumothorax post right thoracentesis. IMPRESSION: No pneumothorax post right thoracentesis. Electronically Signed   By: Marybelle Killings M.D.   On: 01/16/2019 14:42   Dg Abd 1 View  Result Date: 01/20/2019 CLINICAL DATA:  NG tube positioning EXAM: ABDOMEN - 1 VIEW COMPARISON:  None. FINDINGS: The NG tube tip projects over the gastric antrum. The tip is pointed distally. The bowel gas pattern is nonspecific and nonobstructive. IMPRESSION: NG tube tip projects over the gastric antrum. The tip is pointed distally. Electronically Signed   By: Constance Holster M.D.   On: 01/20/2019 22:37   US Abdomen Complete  Result Date: 01/17/2019 CLINICAL DATA:  Chronic kidney disease EXAM: ABDOMEN ULTRASOUND COMPLETE COMPARISON:  CT 01/14/2019 FINDINGS: Gallbladder: Gallbladder wall is mildly thickened at 5 mm. No visible stones or sonographic Murphy's sign. Common bile duct: Diameter: Normal caliber, 3 mm Liver: Coarsened echotexture and nodular contours of the liver suggests cirrhosis. No focal hepatic abnormality. Portal vein is patent on color Doppler imaging with  normal direction of blood flow towards the liver. IVC: Prominent IVC and and hepatic veins which can be seen with right heart failure. Pancreas: Visualized portion unremarkable. Spleen: Size and appearance within normal limits. Right Kidney: Length: 12.3 cm. Echogenicity within normal limits. No mass or hydronephrosis. Left Kidney: Length: 11.5 cm. Echogenicity within normal limits. No mass or hydronephrosis. Abdominal aorta: No aneurysm visualized. Other findings: Ascites noted around the liver and spleen. IMPRESSION: Heterogeneous echotexture throughout the liver with subtle nodularity of the contours suggesting cirrhosis. Gallbladder wall thickening. Burtis Junes this is related to liver disease. Perihepatic and perisplenic ascites. Mildly dilated IVC and hepatic veins can be seen with right heart failure. Electronically Signed   By: Rolm Baptise M.D.   On: 01/17/2019 19:25   Ir Fluoro Guide Cv Line Right  Result Date: 01/20/2019 INDICATION: Renal insufficiency. In need of intravenous access for the initiation of dialysis. EXAM: NON-TUNNELED CENTRAL VENOUS HEMODIALYSIS CATHETER PLACEMENT WITH ULTRASOUND AND FLUOROSCOPIC GUIDANCE COMPARISON:  Chest CT-01/14/2019 MEDICATIONS: None FLUOROSCOPY TIME:  24 seconds (10 mGy) COMPLICATIONS: None immediate. PROCEDURE: Informed written consent was obtained from the patient after a discussion of the risks, benefits, and alternatives to treatment. Questions regarding the procedure were encouraged and answered. The right neck and chest were prepped with chlorhexidine in a sterile fashion, and a sterile drape was applied covering the operative field. Maximum barrier sterile technique with sterile gowns and gloves were used for the procedure. A timeout was performed prior to the initiation of the procedure. After the overlying soft tissues were anesthetized, a small venotomy incision was created and a micropuncture kit was utilized to access the internal jugular vein. Real-time  ultrasound guidance was utilized for vascular access including the acquisition of a permanent ultrasound image documenting patency of the accessed vessel. The microwire was utilized to measure appropriate catheter length. A stiff glidewire was advanced to the level of the IVC. Under fluoroscopic guidance, the venotomy was serially dilated, ultimately allowing placement of a 20 cm temporary Trialysis catheter with tip ultimately terminating within the superior aspect of the right atrium. Final catheter positioning was confirmed and documented with a spot radiographic image. The catheter aspirates and flushes normally. The catheter was flushed with appropriate volume heparin dwells. The catheter exit site was secured with a 0-Prolene retention suture. A dressing was placed. The patient tolerated the procedure  well without immediate post procedural complication. IMPRESSION: Successful placement of a right internal jugular approach 20 cm temporary dialysis catheter with tip terminating with in the superior aspect of the right atrium. The catheter is ready for immediate use. PLAN: This catheter may be converted to a tunneled dialysis catheter at a later date as indicated. Electronically Signed   By: Sandi Mariscal M.D.   On: 01/20/2019 17:24   Ir US Guide Vasc Access Right  Result Date: 01/21/2019 INDICATION: Renal insufficiency. In need of intravenous access for the initiation of dialysis. EXAM: NON-TUNNELED CENTRAL VENOUS HEMODIALYSIS CATHETER PLACEMENT WITH ULTRASOUND AND FLUOROSCOPIC GUIDANCE COMPARISON:  Chest CT-01/14/2019 MEDICATIONS: None FLUOROSCOPY TIME:  24 seconds (10 mGy) COMPLICATIONS: None immediate. PROCEDURE: Informed written consent was obtained from the patient after a discussion of the risks, benefits, and alternatives to treatment. Questions regarding the procedure were encouraged and answered. The right neck and chest were prepped with chlorhexidine in a sterile fashion, and a sterile drape was  applied covering the operative field. Maximum barrier sterile technique with sterile gowns and gloves were used for the procedure. A timeout was performed prior to the initiation of the procedure. After the overlying soft tissues were anesthetized, a small venotomy incision was created and a micropuncture kit was utilized to access the internal jugular vein. Real-time ultrasound guidance was utilized for vascular access including the acquisition of a permanent ultrasound image documenting patency of the accessed vessel. The microwire was utilized to measure appropriate catheter length. A stiff glidewire was advanced to the level of the IVC. Under fluoroscopic guidance, the venotomy was serially dilated, ultimately allowing placement of a 20 cm temporary Trialysis catheter with tip ultimately terminating within the superior aspect of the right atrium. Final catheter positioning was confirmed and documented with a spot radiographic image. The catheter aspirates and flushes normally. The catheter was flushed with appropriate volume heparin dwells. The catheter exit site was secured with a 0-Prolene retention suture. A dressing was placed. The patient tolerated the procedure well without immediate post procedural complication. IMPRESSION: Successful placement of a right internal jugular approach 20 cm temporary dialysis catheter with tip terminating with in the superior aspect of the right atrium. The catheter is ready for immediate use. PLAN: This catheter may be converted to a tunneled dialysis catheter at a later date as indicated. Electronically Signed   By: Sandi Mariscal M.D.   On: 01/20/2019 17:24   Ct Biopsy  Result Date: 01/26/2019 CLINICAL DATA:  Thrombocytopenia and anemia. Bone marrow biopsy requested for further workup. EXAM: CT GUIDED BONE MARROW ASPIRATION AND BIOPSY ANESTHESIA/SEDATION: Versed 2.0 mg IV, Fentanyl 100 mcg IV Total Moderate Sedation Time:   10 minutes. The patient's level of consciousness  and physiologic status were continuously monitored during the procedure by Radiology nursing. PROCEDURE: The procedure risks, benefits, and alternatives were explained to the patient. Questions regarding the procedure were encouraged and answered. The patient understands and consents to the procedure. A time out was performed prior to initiating the procedure. The right gluteal region was prepped with chlorhexidine. Sterile gown and sterile gloves were used for the procedure. Local anesthesia was provided with 1% Lidocaine. Under CT guidance, an 11 gauge On Control bone cutting needle was advanced from a posterior approach into the right iliac bone. Needle positioning was confirmed with CT. Initial non heparinized and heparinized aspirate samples were obtained of bone marrow. Core biopsy was performed via the On Control drill needle. COMPLICATIONS: None FINDINGS: Inspection of initial aspirate did reveal  visible particles. Intact core biopsy sample was obtained. IMPRESSION: CT guided bone marrow biopsy of right posterior iliac bone with both aspirate and core samples obtained. Electronically Signed   By: Aletta Edouard M.D.   On: 01/26/2019 09:21   Dg Chest Port 1 View  Result Date: 01/26/2019 CLINICAL DATA:  Pulmonary edema, shortness of breath EXAM: PORTABLE CHEST 1 VIEW COMPARISON:  01/23/2019 FINDINGS: Right Vas-Cath, left central line remain in place, unchanged. Cardiomegaly with vascular congestion. Improving bilateral interstitial and airspace opacities, likely improving edema. Focal right lower lobe opacity. Possible small effusions. IMPRESSION: Improving diffuse interstitial and airspace opacities, likely improving edema. Focal right lower lobe infiltrate. Suspect small effusions. Electronically Signed   By: Rolm Baptise M.D.   On: 01/26/2019 09:30   Dg Chest Port 1 View  Result Date: 01/23/2019 CLINICAL DATA:  Acute respiratory failure EXAM: PORTABLE CHEST 1 VIEW COMPARISON:  Chest radiograph  01/22/2019 FINDINGS: Central venous catheter tip projects over the superior vena cava. Dual lumen central venous catheter tip projects over the superior vena cava. Monitoring leads overlie the patient. Stable cardiomegaly. Aortic atherosclerosis. Interval extubation. Low lung volumes. Layering right pleural effusion. Diffuse bilateral interstitial opacities, mildly increased, right greater than left. No pneumothorax. IMPRESSION: Interval extubation. Persistent layering right pleural effusion. Mild increased bilateral airspace opacities, right greater than left. Layering right pleural effusion. Electronically Signed   By: Lovey Newcomer M.D.   On: 01/23/2019 05:42   Portable Chest Xray  Result Date: 01/22/2019 CLINICAL DATA:  Endotracheal tube present. EXAM: PORTABLE CHEST 1 VIEW COMPARISON:  Radiograph January 21, 2019. FINDINGS: Stable cardiomegaly. Endotracheal tube is in good position. Right internal jugular dialysis catheter is unchanged. Left internal jugular catheter is unchanged. No pneumothorax is noted. Left lung is clear. Stable right basilar atelectasis or infiltrate is noted with associated pleural effusion. Bony thorax is unremarkable. IMPRESSION: Stable support apparatus. Stable right basilar opacity as described above. Aortic Atherosclerosis (ICD10-I70.0). Electronically Signed   By: Marijo Conception M.D.   On: 01/22/2019 07:48   Dg Chest Port 1 View  Result Date: 01/21/2019 CLINICAL DATA:  Encounter for central line placement. EXAM: PORTABLE CHEST 1 VIEW COMPARISON:  Radiograph of January 21, 2019. FINDINGS: Mild cardiomegaly is noted. Endotracheal tube is in grossly good position. Nasogastric tube has been removed. No pneumothorax is noted. Stable position of right internal jugular dialysis catheter with distal tip in expected position of right atrium. Interval placement of left internal jugular catheter with distal tip in expected position of the SVC. Left lung is clear. Mild right basilar atelectasis  and small pleural effusion is noted. Bony thorax is unremarkable. IMPRESSION: Interval placement of left internal jugular catheter with distal tip in expected position of SVC. Nasogastric tube has been removed. Otherwise stable support apparatus. No pneumothorax is noted. Mild right basilar atelectasis and effusion is noted. Electronically Signed   By: Marijo Conception M.D.   On: 01/21/2019 16:21   Portable Chest X-ray  Result Date: 01/21/2019 CLINICAL DATA:  Endotracheal tube EXAM: PORTABLE CHEST 1 VIEW COMPARISON:  Yesterday FINDINGS: Endotracheal tube tip between the clavicular heads and carina. Orogastric tube reaches the stomach. Dialysis catheter with tip at the upper cavoatrial junction. Cardiomegaly, stable. Haziness of the right more than left lower chest attributed atelectasis and pleural fluid. Right lateral fifth rib lucent lesion that appears aggressive on prior imaging. These results were called by telephone at the time of interpretation on 01/21/2019 at 11:05 am to Dr. Noe Gens , who verbally  acknowledged these results. IMPRESSION: 1. New endotracheal and orogastric tubes in expected position. Mildly improved lung volumes. 2. Lytic lesion in the lateral right fifth rib needing workup for malignancy Electronically Signed   By: Monte Fantasia M.D.   On: 01/21/2019 11:06   Dg Chest Port 1 View  Result Date: 01/20/2019 CLINICAL DATA:  Central line placement EXAM: PORTABLE CHEST 1 VIEW COMPARISON:  01/16/2019 FINDINGS: There is a well-positioned non tunneled right-sided central venous catheter with tip projecting over the right atrium. The cardiac silhouette is enlarged. There is no pneumothorax. There is a moderate sized right-sided pleural effusion. There is a small left-sided pleural effusion. There is generalized volume overload with evidence of pulmonary edema. IMPRESSION: 1. Well-positioned right-sided dialysis catheter.  No pneumothorax. 2. Cardiomegaly with volume overload including small  to moderate-sized bilateral pleural effusions. Electronically Signed   By: Constance Holster M.D.   On: 01/20/2019 20:15   Ct Bone Marrow Biopsy & Aspiration  Result Date: 01/26/2019 CLINICAL DATA:  Thrombocytopenia and anemia. Bone marrow biopsy requested for further workup. EXAM: CT GUIDED BONE MARROW ASPIRATION AND BIOPSY ANESTHESIA/SEDATION: Versed 2.0 mg IV, Fentanyl 100 mcg IV Total Moderate Sedation Time:   10 minutes. The patient's level of consciousness and physiologic status were continuously monitored during the procedure by Radiology nursing. PROCEDURE: The procedure risks, benefits, and alternatives were explained to the patient. Questions regarding the procedure were encouraged and answered. The patient understands and consents to the procedure. A time out was performed prior to initiating the procedure. The right gluteal region was prepped with chlorhexidine. Sterile gown and sterile gloves were used for the procedure. Local anesthesia was provided with 1% Lidocaine. Under CT guidance, an 11 gauge On Control bone cutting needle was advanced from a posterior approach into the right iliac bone. Needle positioning was confirmed with CT. Initial non heparinized and heparinized aspirate samples were obtained of bone marrow. Core biopsy was performed via the On Control drill needle. COMPLICATIONS: None FINDINGS: Inspection of initial aspirate did reveal visible particles. Intact core biopsy sample was obtained. IMPRESSION: CT guided bone marrow biopsy of right posterior iliac bone with both aspirate and core samples obtained. Electronically Signed   By: Aletta Edouard M.D.   On: 01/26/2019 09:21   Vas [place] Upper Extremity Venous Duplex  Result Date: 01/19/2019 UPPER VENOUS STUDY  Indications: Swelling Performing Technologist: Maudry Mayhew MHA, RDMS, RVT, RDCS  Examination Guidelines: A complete evaluation includes B-mode imaging, spectral Doppler, color Doppler, and power Doppler as needed of  all accessible portions of each vessel. Bilateral testing is considered an integral part of a complete examination. Limited examinations for reoccurring indications may be performed as noted.  Right Findings: +----------+------------+---------+-----------+----------+-------+ RIGHT     CompressiblePhasicitySpontaneousPropertiesSummary +----------+------------+---------+-----------+----------+-------+ IJV           Full       Yes       Yes                      +----------+------------+---------+-----------+----------+-------+ Subclavian    Full       Yes       Yes                      +----------+------------+---------+-----------+----------+-------+ Axillary      Full       Yes       Yes                      +----------+------------+---------+-----------+----------+-------+  Brachial      None                 No                Acute  +----------+------------+---------+-----------+----------+-------+ Radial        Full                                          +----------+------------+---------+-----------+----------+-------+ Ulnar         Full                                          +----------+------------+---------+-----------+----------+-------+ Cephalic      Full                                          +----------+------------+---------+-----------+----------+-------+ Basilic       Full                                          +----------+------------+---------+-----------+----------+-------+  Left Findings: +----------+------------+---------+-----------+----------+-------+ LEFT      CompressiblePhasicitySpontaneousPropertiesSummary +----------+------------+---------+-----------+----------+-------+ Subclavian               Yes       Yes                      +----------+------------+---------+-----------+----------+-------+  Summary:  Right: No evidence of superficial vein thrombosis in the upper extremity. Findings consistent with acute  deep vein thrombosis involving the right brachial veins.  Left: No evidence of thrombosis in the subclavian.  *See table(s) above for measurements and observations.  Diagnosing physician: Deitra Mayo MD Electronically signed by Deitra Mayo MD on 01/19/2019 at 4:52:34 PM.    Final    US Thoracentesis Asp Pleural Space W/img Guide  Result Date: 01/16/2019 INDICATION: Shortness of breath. Possible right lower lobe pneumonia. Right pleural effusion. Request for diagnostic and therapeutic thoracentesis. EXAM: ULTRASOUND GUIDED RIGHT THORACENTESIS MEDICATIONS: 1% lidocaine 10 mL COMPLICATIONS: None immediate. PROCEDURE: An ultrasound guided thoracentesis was thoroughly discussed with the patient and questions answered. The benefits, risks, alternatives and complications were also discussed. The patient understands and wishes to proceed with the procedure. Written consent was obtained. Ultrasound was performed to localize and mark an adequate pocket of fluid in the right chest. The area was then prepped and draped in the normal sterile fashion. 1% Lidocaine was used for local anesthesia. Under ultrasound guidance a 6 Fr Safe-T-Centesis catheter was introduced. Thoracentesis was performed. The catheter was removed and a dressing applied. FINDINGS: A total of approximately 600 mL of clear yellow fluid was removed. Samples were sent to the laboratory as requested by the clinical team. IMPRESSION: Successful ultrasound guided right thoracentesis yielding 600 mL of pleural fluid. No pneumothorax on post-procedure chest x-ray. Read by: Gareth Eagle, PA-C Electronically Signed   By: Jerilynn Mages.  Shick M.D.   On: 01/16/2019 14:54   Pathology:  Diagnosis Bone Marrow, Aspirate,Biopsy, and Clot, right iliac BONE MARROW: - HYPERCELLULAR MARROW INVOLVED BY PLASMA CELL NEOPLASM (90%) - SEE COMMENT PERIPHERAL BLOOD: - MACROCYTIC ANEMIA AND THROMBOCYTOPENIA -  SEE COMPLETE BLOOD COUNT Diagnosis Note The features in the  marrow are consistent with plasma cell myeloma; correlation with cytogenetics and clinical studies is recommended. FISH studies are available upon request. Thressa Sheller MD Pathologist, Electronic Signature (Case signed 01/28/2019)  Assessment and Plan:  1.  Last was a myeloma with elevated lambda light chains 2.  Anemia 3.  Thrombocytopenia 4.  Lytic lesion in the right fifth rib 5.  Acute kidney injury 6.  Right arm brachial vein DVT 7.  Upper GI bleed 8.  Hypercalcemia, resolved 9.  Cirrhosis 10.  History of CVA with residual right-sided deficits  -Patient presented with anemia, acute kidney injury, and hypercalcemia all concerning for myeloma.  He has undergone additional serum and urine testing as well as a bone marrow biopsy which confirmed the findings of multiple myeloma.  Discussed findings with the patient today. He will be seen by Dr. Marin Olp later today to discuss treatment options. -Anemia is likely multifactorial related to his diagnosis of myeloma as well as GI bleed.  Transfuse for hemoglobin less than 7.0 or active bleeding.  He is receiving 1 unit packed red blood cells today.  GI is following. -Thrombocytopenia is likely multifactorial related to his myeloma and cirrhosis.  Recommend continued monitoring.  Transfuse platelets if his platelet count is less than 10,000 or active bleeding.  No transfusion is indicated today. -The patient will need to start anticoagulation when his GI bleed has resolved. -His hypercalcemia has resolved with hydration.  May need additional therapy for hypercalcemia and lytic bone lesion as an outpatient.  Thank you for this referral.   Scott Bussing, DNP, AGPCNP-BC, AOCNP  ADDENDUM: I saw and examined Mr. Offord.  He is a really interesting man.  He is originally from [place].  He lives in [place].  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  He is producing lambda light chains.  He actually did have a bone marrow biopsy.  This was done  a couple days ago.  This clearly shows extensive marrow replacement by myeloma.  We do not have any cytogenetic studies back but I will have to believe that he will have altered chromosomes.  He has renal failure.  Even with myeloma treatment, the renal failure will likely not get any better.  The anemia is reflective of his GI bleeding, renal failure, and myeloma.  He needs a bone survey.  This will tell us how extensive his bony diseases.  Treatment and evaluation and management of his anemia is clearly can be complicated by this thrombus that he has in his right arm.  I would say put him on heparin which would be reasonable in renal failure.  This arm is incredibly swollen.  As far as his myeloma therapy would be considered, he probably would be a candidate for therapy.  He lives in [place] and given his overall status, it would be a lot easier for him to be treated in [place].  I am not sure how we would be able to get treatment started as an inpatient.  This probably would be helpful in all honesty.  Ideally, I would treat somebody like this with Kyprolis/Cytoxan/Decadron.  However, his overall status is just not that great.  We can at least get him on Decadron.  This has some toxic effect on the plasma cells.  With his anemia, I really cannot use ESA because of his thrombus.  I talked to him at length.  He definitely has a good understanding of the problem.  I told him what myeloma was.  I told him that this is disease is not curable but definitely treatable.  I told him that even though we could treat this, his renal failure probably is not going to improve.  He also probably needs a bone hardener.  With his renal failure, I think we are limited to [facility].  This I think would be helpful.  The GI bleeding clearly needs to be resolved.  I just wonder if there may not be some amyloid in his GI tract.  Maybe when he has his endoscopy, he can have biopsies done and see if there is  amyloidosis secondary to the light chain myeloma.  I would definitely recheck his iron studies.  This is incredibly complicated.  There are a lot of factors at play.  We will follow along and try to help out as much as possible.  I think the cytogenetic studies will be critical for the prognosis.  I spent over an hour with him today.  Again, I really enjoyed talking to him about his life and his life experiences.  He is a very interesting guy who definitely has a lot to say.  Lattie Haw, MDDarlyn Chamber 33:6

## 2019-01-29 NOTE — Progress Notes (Signed)
Patient ID: Scott Mcdowell, male   DOB: [birth year], 78 y.o.   MRN: [id number]  [facility] Progress Note   Assessment/ Plan:   1. 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  Volume overload/ hypotension: RUE edema mostly, some hip edema - CXR clear 6/9 - UF as tol on HD  - tight fluid restriction 3.  Anemia d/t myeloma/ ABL/ acute GI bleed: - transfuse prn 4.  Right arm brachial vein DVT: - IV hep dc'd due to GIB 5.  Vitamin D deficiency: Continue cholecalciferol supplementation when stabilized 6.  Upper GI bleed:  - duodenal bulb region was treated endoscopically 7.  CAP - treated on admission for pna w/ IV abx 8.  Hx of CVA - R hemiparesis  [name], MD 01/29/2019, 10:42 AM  CXR 6/9 - resolution of IS pattern/ edema  Subjective:   No new c/o's.     Objective:   BP (!) 93/57   Pulse 89   Temp 97.8 F (36.6 C) (Oral)   Resp 18   Ht _0  (1.88 m)   Wt 106.8 kg   SpO2 97%   BMI 30.23 kg/m   Intake/Output Summary (Last 24 hours) at 01/29/2019 1042 Last data filed at 01/29/2019 0900 Gross per 24 hour  Intake 1360 ml  Output 1380 ml  Net -20 ml   Weight change: -1.2 kg  Physical Exam: Gen: Awake/alert, seen in room, R IJ temp cath CVS: Pulse regular rhythm, normal rate, S1-S2 normal Resp: Anteriorly clear to auscultation, no rales Abd: Soft, obese, nontender Ext: 2+ RUE edema (stroke arm), 1+ hip edema bilat  Imaging: No results found.  Labs: BMET Recent Labs  Lab 01/24/19 0500 01/24/19 1600 01/25/19 0301 01/25/19 1955 01/26/19 0404 01/26/19 1827 01/27/19 0525 01/28/19 0830 01/29/19 0336  NA 135 134* 136 135 134* 135 136 135 136  K 4.5 4.9 4.6 5.1 5.0 5.0 4.8 4.6 4.0  CL 103 101 102 102 101 103  103 101 99  CO_1  GLUCOSE 106* 133* 115* 122* 115* 117* 98 126* 117*  BUN _2 26* 27* 56* 38*  CREATININE 1.90* 2.03* 1.93* 2.20* 2.21* 2.30* 2.15* 4.12* 3.00*  CALCIUM 8.9 9.0 9.3 9.2 9.3 9.3 9.5 9.6 8.8*  PHOS 2.3* 2.5 2.3* 2.9 3.1 3.4 3.1  --   --    CBC Recent Labs  Lab 01/25/19 0301 01/26/19 0406 01/27/19 1059 01/29/19 0336  WBC 11.9* 11.1* 8.2 7.1  NEUTROABS 7.3 8.0*  --   --   HGB 7.5* 7.2* 7.3* 6.8*  HCT 23.5* 22.5* 22.9* 21.6*  MCV 102.6* 103.7* 103.6* 102.4*  PLT 105* 97* 94* 93*   Medications:    . chlorhexidine gluconate (MEDLINE KIT)  15 mL Mouth Rinse BID  . Chlorhexidine Gluconate Cloth  6 each Topical Q0600  . feeding supplement (ENSURE ENLIVE)  237 mL Oral BID BM  . feeding supplement (PRO-STAT SUGAR FREE 64)  30 mL Oral Daily  . hydrocortisone sod succinate (SOLU-CORTEF) inj  50 mg Intravenous Q12H  . mouth rinse  15 mL Mouth Rinse q12n4p  . midodrine  10 mg Oral TID WC  . nystatin  Topical TID  . pantoprazole  40 mg Oral BID AC  . polyethylene glycol  17 g Oral Daily

## 2019-01-29 NOTE — Progress Notes (Signed)
PT Cancellation Note  Patient Details Name: Scott Mcdowell MRN: [id number] DOB: 1941/05/08   Cancelled Treatment:    Reason Eval/Treat Not Completed: Medical issues which prohibited therapy.  Pt is having low BP and is 6.8 Hgb, and will wait to see what medical orders are placed before attempting therapy.   Ramond Dial 01/29/2019, 9:47 AM  Mee Hives, PT MS Acute Rehab Dept. Number: [place] [facility] [place]

## 2019-01-29 NOTE — Progress Notes (Addendum)
Occupational Therapy Treatment Patient Details Name: HERRICK HARTOG MRN: [id number] DOB: 1941-02-22 Today's Date: 01/29/2019    History of present illness 78 yo male admitted with ARF, scrotal cellulitis, Pna. Pt extubated 01/22/19.  Hx of CVA with R hemiparesis   OT comments  Pt progressing toward established goals. Pt currently requires modA for UB ADL and setupA for grooming and eating. Pt rolled in bed with modA and totalA for pericare. Use of maximove to transfer pt from bed to recliner this session for pt/therapist safety and to optimize pt participation during session. Pt completed ADL and general UE exercises while seated in recliner. Pt will continue to benefit from skilled OT services to maximize safety and independence with ADL/IADL and functional mobility. Will continue to follow acutely and progress as tolerated.     Follow Up Recommendations  CIR    Equipment Recommendations  None recommended by OT    Recommendations for Other Services      Precautions / Restrictions Precautions Precautions: Fall Precaution Comments: R hemiparesis, h/o 1 fall in past 1 year Restrictions Weight Bearing Restrictions: No       Mobility Bed Mobility Overal bed mobility: Needs Assistance Bed Mobility: Rolling Rolling: Mod assist         General bed mobility comments: modA to get pad for maximove   Transfers Overall transfer level: Needs assistance               General transfer comment: transferred pt with RN from bed to recliner with maximove this session;use of maximove for optimizing pt participation during sessio and for pt/therapist safety     Balance Overall balance assessment: Needs assistance Sitting-balance support: No upper extremity supported;Feet supported Sitting balance-Leahy Scale: Fair                                     ADL either performed or assessed with clinical judgement   ADL Overall ADL's : Needs  assistance/impaired Eating/Feeding: Set up;Sitting Eating/Feeding Details (indicate cue type and reason): pt required assistance with removing lids Grooming: Set up;Sitting   Upper Body Bathing: Moderate assistance                   Toileting- Clothing Manipulation and Hygiene: Maximal assistance;Moderate assistance Toileting - Clothing Manipulation Details (indicate cue type and reason): pt rolled R<>L for pericare       General ADL Comments: pt completed ADL while sitting in supported seat;use of maximove to get pt to chair to maximize pt's participation during session     Vision       Perception     Praxis      Cognition Arousal/Alertness: Awake/alert Behavior During Therapy: [facility] for tasks assessed/performed Overall Cognitive Status: Within Functional Limits for tasks assessed                                          Exercises Exercises: General Upper Extremity General Exercises - Upper Extremity Shoulder Flexion: AROM;AAROM;Both;10 reps Shoulder Extension: AROM;AAROM;Both;10 reps Elbow Flexion: AROM;AAROM;Both;10 reps Elbow Extension: AROM;AAROM;Both;10 reps Wrist Flexion: AROM;AAROM;Both;10 reps Wrist Extension: AROM;AAROM;Both;10 reps Digit Composite Flexion: AROM;Right;10 reps Composite Extension: AROM;10 reps;Right   Shoulder Instructions       General Comments educated pt on importance of elevating RUE;discussed HEP general UE 3x/day 10x each;educated pt to incorporate  RUE into daily acitivites     Pertinent Vitals/ Pain       Pain Assessment: No/denies pain  Home Living                                          Prior Functioning/Environment              Frequency  Min 2X/week        Progress Toward Goals  OT Goals(current goals can now be found in the care plan section)  Progress towards OT goals: Progressing toward goals  Acute Rehab OT Goals Patient Stated Goal: to regain PLOF OT Goal  Formulation: With patient Time For Goal Achievement: 02/10/19 Potential to Achieve Goals: Good ADL Goals Pt Will Perform Grooming: with min assist;sitting Pt Will Transfer to Toilet: with min assist;stand pivot transfer;bedside commode Pt Will Perform Toileting - Clothing Manipulation and hygiene: with min guard assist;sit to/from stand Additional ADL Goal #1: pt will stand and stabilize with AD with min A for adls x 2 minutes Additional ADL Goal #2: pt will incorporate RUE as assist without cues at least one time per session Additional ADL Goal #3: pt will be independent with RUE strengthening program  Plan Discharge plan remains appropriate    Co-evaluation                 AM-PAC OT "6 Clicks" Daily Activity     Outcome Measure   Help from another person eating meals?: A Little Help from another person taking care of personal grooming?: A Lot Help from another person toileting, which includes using toliet, bedpan, or urinal?: A Lot Help from another person bathing (including washing, rinsing, drying)?: A Lot Help from another person to put on and taking off regular upper body clothing?: A Lot Help from another person to put on and taking off regular lower body clothing?: Total 6 Click Score: 12    End of Session Equipment Utilized During Treatment: Other (comment)(maximove)  OT Visit Diagnosis: Unsteadiness on feet (R26.81);Muscle weakness (generalized) (M62.81);Other abnormalities of gait and mobility (R26.89);History of falling (Z91.81);Repeated falls (R29.6)   Activity Tolerance Patient tolerated treatment well   Patient Left in chair;with call bell/phone within reach;with chair alarm set   Nurse Communication Mobility status;Need for lift equipment(nsg present during use of maximove)        Time: 9379-0240 OT Time Calculation (min): 26 min  Charges: OT General Charges $OT Visit: 1 Visit OT Treatments $Self Care/Home Management : 8-22 mins $Therapeutic  Exercise: 8-22 mins  Dorinda Hill OTR/L Acute Rehabilitation Services Office: [place] 01/29/2019, 4:35 PM

## 2019-01-29 NOTE — Progress Notes (Signed)
Inpatient Rehabilitation Admissions Coordinator  Inpatient Rehab Consult received. I met with patient at the bedside for rehabilitation assessment. We discussed goals and expectations of an inpatient rehab admission.  It is too early to determine what patient will need due to ongoing medical issues. I will follow his progress to assist with dispo as approrpiate.  Danne Baxter, RN, MSN Rehab Admissions Coordinator [phone] 01/29/2019 10:52 AM

## 2019-01-29 NOTE — Progress Notes (Addendum)
PROGRESS NOTE    Scott Mcdowell  YTK:[id number] DOB: Jan 11, 1941 DOA: 01/14/2019 PCP: [name], MD   Brief Narrative: Scott Mcdowell is a 78 y.o. male presenting with edema. Patient admitted with pneumonia and his hospital course has been complicated by AKI requiring renal replacement therapy, circulatory shock requiring vasopressors, respiratory failure requiring brief intubation, acute blood loss anemia from bleeding upper GI sources requiring EGD intervention and right upper extremity DVT requiring anticoagulation prior to being now complicated by upper GI bleeding.    Assessment & Plan:   Principal Problem:   Shock ([place]) Active Problems:   ARF (acute renal failure) (HCC)   Hyperkalemia   B12 deficiency anemia   Pleural effusion on right   Ascites   CAP (community acquired pneumonia)   Elevated troponin   Acute blood loss anemia   Upper GI bleed   AKI secondary to ATN Hyperkalemia Initially managed with diuresis with poor effect. While in ICU, he was started on CRRT. He is now transitioned to IHD with the hopes of return of renal function. No urine output documented.  -Nephrology recommendations: HD today -Strict urine output  Hypotension/shock Patient required the use of vasopressors while in the ICU. Concern for relative adrenal insufficiency in the ICU and has been started on hydrocortisone in addition to midodrine. Blood pressure still soft and acutely worsened in setting of worsening anemia and recurrent melena -Continue hydrocortisone -Continue midodrine -Vitals q6 hours  Anemia Thrombocytopenia Acute blood loss anemia Upper GI bleeding Hemoglobin stable. Recent EGD performed on 6/4 and significant for multiple areas of bleeding which were treated with clip and epi. Iron studies normal except a mildly elevated ferritin. GI signed off and recommending to restart anticoagulation as indicated. Unfortunately, evidence he is possibly re-bleeding.  Started on 1 unit of PRBC -Daily CBC -GI consult  Right upper extremity DVT Treatment complicated by anemia requiring multiple transfusions, in addition to epistaxis and hemoptysis. Hemoglobin stable currently. -Will likely need to start Coumadin in light of renal disease, however, still high risk for bleeding  Pneumonia Associated right para-pneumonia effusion. Completed antibiotic course. Also underwent thoracentesis. Resolved  Lytic rib lesion Concern for possible malignancy. ?MM. 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  History of CVA Associated right sided deficits. Per patient, he was able to ambulate pretty well prior to current illness. PT/OT recommending CIR. CIR recommends consideration when patient is closer to discharge/oncology recs  Pressure injuries Sacrum, left heel, right heel, POA  Cirrhosis Seen on CT. Will need outpatient follow-up.  Obesity Body mass index is 30.23 kg/m.   DVT prophylaxis: SCDs Code Status:   Code Status: Full Code Family Communication: None Disposition Plan: Possible CIR if able vs SNF pending management of kidney failure   Consultants:   PCCM  Nephrology  [facility]  Procedures:   6/4: EGD Impression:               - Normal esophagus.                           - Adherent clot in the distal gastric body, unclear                            if NG tube trauma but treated with hemostasis clip  and epi. Friable mucosa in the proximal stomach                            with contact oozing.                           - Red blood with active bleeding and clots in the                            duodenal bulb. 4 discrete areas of adherent clot                            with extremely friable tissue but no focal                            ulceration appreciated. One of these areas  had a                            visible vessel which was bleeding, another lesion                            had active oozing. 2 areas treated as outlined                            above. 4 clips placed total in the duodenal bulb.                            Patient with lytic lesions noted on xray, unclear                            if he has underlying myeloma? These lesions appear                            to have high risk for relbeeding in the setting of                            coagulopathy. No active bleeding noted following                            endoscopic intervention.  Recommendation:           - Patient to remain in the ICU for ongoing care.                           - NPO.                           - Continue present medications including IV PPI                           - Serial Hgb                           - Further workup pending course. If he survives  these acute issues, may need repeat EGD to                            re-evaluate duodenal bulb following treatment with                            PPI and workup underlying liver disease  Antimicrobials:  Azithro 5/29 >> 5/31   Vanco 5/29 >> 5/30  Cefepime 5/30 >> 6/4    Subjective: No concerns today. No issues overnight.  Objective: Vitals:   01/29/19 0451 01/29/19 0908 01/29/19 0945 01/29/19 1217  BP: (!) 89/55 (!) 81/55 (!) 93/57 (!) 85/49  Pulse: 70 85 89 86  Resp: '19 18 18 18  ' Temp: 98.2 F (36.8 C) 98.1 F (36.7 C) 97.8 F (36.6 C)   TempSrc: Oral Oral Oral   SpO2: 98% 97% 97% 98%  Weight:      Height:        Intake/Output Summary (Last 24 hours) at 01/29/2019 1235 Last data filed at 01/29/2019 0900 Gross per 24 hour  Intake 1060 ml  Output 1380 ml  Net -320 ml   Filed Weights   01/28/19 0448 01/28/19 2040 01/29/19 0022  Weight: 109.2 kg 108 kg 106.8 kg    Examination:  General exam: Appears calm and comfortable Respiratory system: Clear to  auscultation. Respiratory effort normal. Cardiovascular system: S1 & S2 heard, RRR. Gastrointestinal system: Abdomen is nondistended, soft and nontender. No organomegaly or masses felt. Normal bowel sounds heard. Central nervous system: Alert and oriented. No focal neurological deficits. Extremities: Edema. No calf tenderness Skin: No cyanosis. No rashes Psychiatry: Judgement and insight appear normal. Mood & affect appropriate.     Data Reviewed: I have personally reviewed following labs and imaging studies  CBC: Recent Labs  Lab 01/24/19 0500 01/25/19 0301 01/26/19 0406 01/27/19 1059 01/29/19 0336  WBC 10.3 11.9* 11.1* 8.2 7.1  NEUTROABS  --  7.3 8.0*  --   --   HGB 7.6* 7.5* 7.2* 7.3* 6.8*  HCT 23.5* 23.5* 22.5* 22.9* 21.6*  MCV 100.4* 102.6* 103.7* 103.6* 102.4*  PLT 103* 105* 97* 94* 93*   Basic Metabolic Panel: Recent Labs  Lab 01/23/19 0500  01/24/19 0500  01/25/19 0301 01/25/19 1955 01/26/19 0404 01/26/19 0406 01/26/19 1827 01/27/19 0524 01/27/19 0525 01/28/19 0830 01/29/19 0336  NA 136   < > 135   < > 136 135 134*  --  135  --  136 135 136  K 4.8   < > 4.5   < > 4.6 5.1 5.0  --  5.0  --  4.8 4.6 4.0  CL 103   < > 103   < > 102 102 101  --  103  --  103 101 99  CO2 24   < > 25   < > '26 25 24  ' --  24  --  '25 24 26  ' GLUCOSE 105*   < > 106*   < > 115* 122* 115*  --  117*  --  98 126* 117*  BUN 37*   < > 23   < > '17 19 22  ' --  26*  --  27* 56* 38*  CREATININE 2.14*   < > 1.90*   < > 1.93* 2.20* 2.21*  --  2.30*  --  2.15* 4.12* 3.00*  CALCIUM 9.1   < > 8.9   < >  9.3 9.2 9.3  --  9.3  --  9.5 9.6 8.8*  MG 2.6*  --  2.7*  --  2.6*  --   --  2.7*  --  2.7*  --   --   --   PHOS 3.2   < > 2.3*   < > 2.3* 2.9 3.1  --  3.4  --  3.1  --   --    < > = values in this interval not displayed.   GFR: Estimated Creatinine Clearance: 26 mL/min (A) (by C-G formula based on SCr of 3 mg/dL (H)). Liver Function Tests: Recent Labs  Lab 01/25/19 0301 01/25/19 1955 01/26/19  0404 01/26/19 1827 01/27/19 0525  ALBUMIN 3.6 3.5 3.6 3.7 3.6   No results for input(s): LIPASE, AMYLASE in the last 168 hours. No results for input(s): AMMONIA in the last 168 hours. Coagulation Profile: No results for input(s): INR, PROTIME in the last 168 hours. Cardiac Enzymes: No results for input(s): CKTOTAL, CKMB, CKMBINDEX, TROPONINI in the last 168 hours. BNP (last 3 results) No results for input(s): PROBNP in the last 8760 hours. HbA1C: No results for input(s): HGBA1C in the last 72 hours. CBG: Recent Labs  Lab 01/24/19 1234  GLUCAP 113*   Lipid Profile: No results for input(s): CHOL, HDL, LDLCALC, TRIG, CHOLHDL, LDLDIRECT in the last 72 hours. Thyroid Function Tests: No results for input(s): TSH, T4TOTAL, FREET4, T3FREE, THYROIDAB in the last 72 hours. Anemia Panel: No results for input(s): VITAMINB12, FOLATE, FERRITIN, TIBC, IRON, RETICCTPCT in the last 72 hours. Sepsis Labs: No results for input(s): PROCALCITON, LATICACIDVEN in the last 168 hours.  Recent Results (from the past 240 hour(s))  MRSA PCR Screening     Status: None   Collection Time: 01/20/19  7:36 PM   Specimen: Nasal Mucosa; Nasopharyngeal  Result Value Ref Range Status   MRSA by PCR NEGATIVE NEGATIVE Final    Comment:        The GeneXpert MRSA Assay (FDA approved for NASAL specimens only), is one component of a comprehensive MRSA colonization surveillance program. It is not intended to diagnose MRSA infection nor to guide or monitor treatment for MRSA infections. Performed at [facility], [place] [address] [place], [place] [address]          Radiology Studies: No results found.      Scheduled Meds: . chlorhexidine gluconate (MEDLINE KIT)  15 mL Mouth Rinse BID  . Chlorhexidine Gluconate Cloth  6 each Topical Q0600  . feeding supplement (ENSURE ENLIVE)  237 mL Oral BID BM  . feeding supplement (PRO-STAT SUGAR FREE 64)  30 mL Oral Daily  . hydrocortisone  sod succinate (SOLU-CORTEF) inj  50 mg Intravenous Q12H  . mouth rinse  15 mL Mouth Rinse q12n4p  . midodrine  10 mg Oral TID WC  . nystatin   Topical TID  . pantoprazole  40 mg Oral BID AC  . polyethylene glycol  17 g Oral Daily   Continuous Infusions:   LOS: 15 days     Cordelia Poche, MD [facility] 01/29/2019, 12:35 PM  If 7PM-7AM, please contact night-coverage [email or web address]

## 2019-01-29 NOTE — Progress Notes (Signed)
Patient assisted OOB to chair via hoyer lift for dinner. Dorthey Sawyer, RN

## 2019-01-30 ENCOUNTER — Inpatient Hospital Stay (HOSPITAL_COMMUNITY): Payer: Medicare Other | Admitting: Critical Care Medicine

## 2019-01-30 ENCOUNTER — Encounter (HOSPITAL_COMMUNITY)
Admission: AD | Disposition: A | Discharge status: Rehabilitation Facility | Payer: Self-pay | Source: Other Acute Inpatient Hospital | Attending: Family Medicine

## 2019-01-30 ENCOUNTER — Encounter (HOSPITAL_COMMUNITY): Payer: Self-pay | Admitting: Critical Care Medicine

## 2019-01-30 ENCOUNTER — Inpatient Hospital Stay (HOSPITAL_COMMUNITY): Payer: Medicare Other

## 2019-01-30 DIAGNOSIS — L89514 Pressure ulcer of right ankle, stage 4: Secondary | ICD-10-CM | POA: Insufficient documentation

## 2019-01-30 DIAGNOSIS — L8990 Pressure ulcer of unspecified site, unspecified stage: Secondary | ICD-10-CM | POA: Insufficient documentation

## 2019-01-30 LAB — CBC
HCT: 23.5 % — ABNORMAL LOW (ref 39.0–52.0)
Hemoglobin: 7.4 g/dL — ABNORMAL LOW (ref 13.0–17.0)
MCH: 32.2 pg (ref 26.0–34.0)
MCHC: 31.5 g/dL (ref 30.0–36.0)
MCV: 102.2 fL — ABNORMAL HIGH (ref 80.0–100.0)
Platelets: 95 10*3/uL — ABNORMAL LOW (ref 150–400)
RBC: 2.3 MIL/uL — ABNORMAL LOW (ref 4.22–5.81)
RDW: 18.4 % — ABNORMAL HIGH (ref 11.5–15.5)
WBC: 7.6 10*3/uL (ref 4.0–10.5)
nRBC: 0.3 % — ABNORMAL HIGH (ref 0.0–0.2)

## 2019-01-30 LAB — BASIC METABOLIC PANEL
Anion gap: 12 (ref 5–15)
BUN: 62 mg/dL — ABNORMAL HIGH (ref 8–23)
CO2: 25 mmol/L (ref 22–32)
Calcium: 9 mg/dL (ref 8.9–10.3)
Chloride: 99 mmol/L (ref 98–111)
Creatinine, Ser: 4.55 mg/dL — ABNORMAL HIGH (ref 0.61–1.24)
GFR calc Af Amer: 13 mL/min — ABNORMAL LOW (ref >60)
GFR calc non Af Amer: 11 mL/min — ABNORMAL LOW (ref >60)
Glucose, Bld: 124 mg/dL — ABNORMAL HIGH (ref 70–99)
Potassium: 4.7 mmol/L (ref 3.5–5.1)
Sodium: 136 mmol/L (ref 135–145)

## 2019-01-30 LAB — POCT I-STAT 4, (NA,K, GLUC, HGB,HCT)
Glucose, Bld: 92 mg/dL (ref 70–99)
HCT: 23 % — ABNORMAL LOW (ref 39.0–52.0)
Hemoglobin: 7.8 g/dL — ABNORMAL LOW (ref 13.0–17.0)
Potassium: 4.2 mmol/L (ref 3.5–5.1)
Sodium: 135 mmol/L (ref 135–145)

## 2019-01-30 LAB — TYPE AND SCREEN
ABO/RH(D): O POS
Antibody Screen: NEGATIVE
Unit division: 0

## 2019-01-30 LAB — FERRITIN: Ferritin: 724 ng/mL — ABNORMAL HIGH (ref 24–336)

## 2019-01-30 LAB — BPAM RBC
Blood Product Expiration Date: 202007102359
ISSUE DATE / TIME: 202006120917
Unit Type and Rh: 5100

## 2019-01-30 LAB — IRON AND TIBC
Iron: 41 ug/dL — ABNORMAL LOW (ref 45–182)
Saturation Ratios: 13 % — ABNORMAL LOW (ref 17.9–39.5)
TIBC: 305 ug/dL (ref 250–450)
UIBC: 264 ug/dL

## 2019-01-30 SURGERY — INVASIVE LAB ABORTED CASE
Anesthesia: Monitor Anesthesia Care

## 2019-01-30 SURGERY — EGD (ESOPHAGOGASTRODUODENOSCOPY)
Anesthesia: Moderate Sedation

## 2019-01-30 MED ORDER — SODIUM CHLORIDE 0.9 % IV SOLN
INTRAVENOUS | Status: DC | PRN
  Administered 2019-01-30: 12:00:00 via INTRAVENOUS

## 2019-01-30 MED ORDER — MIDAZOLAM HCL 5 MG/5ML IJ SOLN
INTRAMUSCULAR | Status: DC | PRN
  Administered 2019-01-30 (×2): 0.5 mg via INTRAVENOUS

## 2019-01-30 MED ORDER — SODIUM CHLORIDE 0.9 % IV SOLN
INTRAVENOUS | Status: DC
  Administered 2019-01-30: 12:00:00 via INTRAVENOUS

## 2019-01-30 MED ORDER — ETOMIDATE 2 MG/ML IV SOLN
INTRAVENOUS | Status: DC | PRN
  Administered 2019-01-30: 10 mg via INTRAVENOUS

## 2019-01-30 MED ORDER — HEPARIN SODIUM (PORCINE) 1000 UNIT/ML IJ SOLN
INTRAMUSCULAR | Status: AC
  Filled 2019-01-30: qty 3

## 2019-01-30 MED ORDER — MIDAZOLAM HCL 2 MG/2ML IJ SOLN
INTRAMUSCULAR | Status: AC
  Filled 2019-01-30: qty 2

## 2019-01-30 SURGICAL SUPPLY — 14 items

## 2019-01-30 NOTE — Anesthesia Procedure Notes (Signed)
Procedure Name: MAC Date/Time: 01/30/2019 12:24 PM Performed by: Wilburn Cornelia, CRNA Pre-anesthesia Checklist: Patient identified, Emergency Drugs available, Suction available, Patient being monitored and Timeout performed Patient Re-evaluated:Patient Re-evaluated prior to induction Oxygen Delivery Method: Nasal cannula Placement Confirmation: positive ETCO2 and breath sounds checked- equal and bilateral Dental Injury: Teeth and Oropharynx as per pre-operative assessment

## 2019-01-30 NOTE — Anesthesia Postprocedure Evaluation (Signed)
Anesthesia Post Note  Patient: Scott Mcdowell  Procedure(s) Performed: ESOPHAGOGASTRODUODENOSCOPY (EGD) WITH PROPOFOL (N/A ) Aborted EGD     Patient location during evaluation: Endoscopy Anesthesia Type: MAC Level of consciousness: awake and alert, patient cooperative and oriented Pain management: pain level controlled Vital Signs Assessment: post-procedure vital signs reviewed and stable Respiratory status: spontaneous breathing, nonlabored ventilation and respiratory function stable Cardiovascular status: blood pressure returned to baseline and stable Postop Assessment: no apparent nausea or vomiting Anesthetic complications: no    Last Vitals:  Vitals:   01/30/19 1123 01/30/19 1241  BP: (!) 88/51 (!) 86/50  Pulse:    Resp: (!) 23 12  Temp: 36.7 C (!) 36.3 C  SpO2: 97% 100%    Last Pain:  Vitals:   01/30/19 1241  TempSrc: Temporal  PainSc: 0-No pain                 Joshia Kitchings,E. Braelen Sproule

## 2019-01-30 NOTE — Progress Notes (Signed)
PROGRESS NOTE    Scott Mcdowell  TIR:[id number] DOB: [birth year] DOA: 01/14/2019 PCP: [name], MD   Brief Narrative: Scott Mcdowell is a 78 y.o. male presenting with edema. Patient admitted with pneumonia and his hospital course has been complicated by AKI requiring renal replacement therapy, circulatory shock requiring vasopressors, respiratory failure requiring brief intubation, acute blood loss anemia from bleeding upper GI sources requiring EGD intervention and right upper extremity DVT requiring anticoagulation prior to being now complicated by upper GI bleeding.    Assessment & Plan:   Principal Problem:   Shock ([place]) Active Problems:   ARF (acute renal failure) (HCC)   Hyperkalemia   B12 deficiency anemia   Pleural effusion on right   Ascites   CAP (community acquired pneumonia)   Elevated troponin   Acute blood loss anemia   Upper GI bleed   Goals of care, counseling/discussion   Lambda light chain myeloma (HCC)   Pressure injury of skin   AKI secondary to ATN Hyperkalemia Initially managed with diuresis with poor effect. While in ICU, he was started on CRRT. He is now transitioned to IHD with the hopes of return of renal function. No urine output documented.  -Nephrology recommendations: HD -Strict urine output  Hypotension/shock Patient required the use of vasopressors while in the ICU. Concern for relative adrenal insufficiency in the ICU and has been started on hydrocortisone in addition to midodrine. Blood pressure still soft and acutely worsened in setting of worsening anemia and recurrent melena -Continue hydrocortisone -Continue midodrine -Vitals q6 hours  Anemia Thrombocytopenia Acute blood loss anemia Upper GI bleeding Hemoglobin stable. Recent EGD performed on 6/4 and significant for multiple areas of bleeding which were treated with clip and epi. Iron studies normal except a mildly elevated ferritin. GI signed off and recommending  to restart anticoagulation as indicated. Unfortunately, evidence he is possibly re-bleeding. Started on 1 unit of PRBC -Daily CBC -GI recommendations: Repeat EGD  Right upper extremity DVT Treatment complicated by anemia requiring multiple transfusions, in addition to epistaxis and hemoptysis. Hemoglobin stable currently. -Will likely need to start Coumadin in light of renal disease, however, still high risk for bleeding  Pneumonia Associated right para-pneumonia effusion. Completed antibiotic course. Also underwent thoracentesis. Resolved  Lytic rib lesion Concern for possible malignancy. ?MM. 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  History of CVA Associated right sided deficits. Per patient, he was able to ambulate pretty well prior to current illness. PT/OT recommending CIR. CIR recommends consideration when patient is closer to discharge/oncology recs  Pressure injuries Sacrum, left heel, right heel, POA  Cirrhosis Seen on CT. Will need outpatient follow-up.  Obesity Body mass index is 31.36 kg/m.   DVT prophylaxis: SCDs Code Status:   Code Status: Full Code Family Communication: None Disposition Plan: Possible CIR if able vs SNF pending management of kidney failure   Consultants:   PCCM  Nephrology   GI  Procedures:   6/4: EGD Impression:               - Normal esophagus.                           - Adherent clot in the distal gastric body, unclear  if NG tube trauma but treated with hemostasis clip                            and epi. Friable mucosa in the proximal stomach                            with contact oozing.                           - Red blood with active bleeding and clots in the                            duodenal bulb. 4 discrete areas of adherent clot                             with extremely friable tissue but no focal                            ulceration appreciated. One of these areas had a                            visible vessel which was bleeding, another lesion                            had active oozing. 2 areas treated as outlined                            above. 4 clips placed total in the duodenal bulb.                            Patient with lytic lesions noted on xray, unclear                            if he has underlying myeloma? These lesions appear                            to have high risk for relbeeding in the setting of                            coagulopathy. No active bleeding noted following                            endoscopic intervention.  Recommendation:           - Patient to remain in the ICU for ongoing care.                           - NPO.                           - Continue present medications including IV PPI                           - Serial  Hgb                           - Further workup pending course. If he survives                            these acute issues, may need repeat EGD to                            re-evaluate duodenal bulb following treatment with                            PPI and workup underlying liver disease  Antimicrobials:  Azithro 5/29 >> 5/31   Vanco 5/29 >> 5/30  Cefepime 5/30 >> 6/4    Subjective: No issues today. Could not get EGD because he had a grape.  Objective: Vitals:   01/30/19 1123 01/30/19 1241 01/30/19 1255 01/30/19 1410  BP: (!) 88/51 (!) 86/50 99/72 102/70  Pulse:   66 81  Resp: (!) '23 12 18 18  ' Temp: 98 F (36.7 C) (!) 97.3 F (36.3 C)    TempSrc: Oral Temporal    SpO2: 97% 100% 100% 95%  Weight: 110.8 kg     Height: '6\' 2"'  (1.88 m)       Intake/Output Summary (Last 24 hours) at 01/30/2019 1524 Last data filed at 01/30/2019 1237 Gross per 24 hour  Intake 820 ml  Output 0 ml  Net 820 ml   Filed Weights   01/29/19 0022 01/30/19 0418 01/30/19 1123  Weight:  106.8 kg 110.8 kg 110.8 kg    Examination:  General exam: Appears calm and comfortable Respiratory system: Clear to auscultation. Respiratory effort normal. Cardiovascular system: S1 & S2 heard, RRR. 2/6 systolic murmur Gastrointestinal system: Abdomen is nondistended, soft and nontender. No organomegaly or masses felt. Normal bowel sounds heard. Central nervous system: Alert and oriented. No focal neurological deficits. Extremities: No edema. No calf tenderness Skin: No cyanosis. No rashes Psychiatry: Judgement and insight appear normal. Mood & affect appropriate.     Data Reviewed: I have personally reviewed following labs and imaging studies  CBC: Recent Labs  Lab 01/25/19 0301 01/26/19 0406 01/27/19 1059 01/29/19 0336 01/30/19 0308 01/30/19 1203  WBC 11.9* 11.1* 8.2 7.1 7.6  --   NEUTROABS 7.3 8.0*  --   --   --   --   HGB 7.5* 7.2* 7.3* 6.8* 7.4* 7.8*  HCT 23.5* 22.5* 22.9* 21.6* 23.5* 23.0*  MCV 102.6* 103.7* 103.6* 102.4* 102.2*  --   PLT 105* 97* 94* 93* 95*  --    Basic Metabolic Panel: Recent Labs  Lab 01/24/19 0500  01/25/19 0301 01/25/19 1955 01/26/19 0404 01/26/19 0406 01/26/19 1827 01/27/19 0524 01/27/19 0525 01/28/19 0830 01/29/19 0336 01/30/19 0308 01/30/19 1203  NA 135   < > 136 135 134*  --  135  --  136 135 136 136 135  K 4.5   < > 4.6 5.1 5.0  --  5.0  --  4.8 4.6 4.0 4.7 4.2  CL 103   < > 102 102 101  --  103  --  103 101 99 99  --   CO2 25   < > '26 25 24  ' --  24  --  '25 24 26 25  ' --   GLUCOSE 106*   < >  115* 122* 115*  --  117*  --  98 126* 117* 124* 92  BUN 23   < > '17 19 22  ' --  26*  --  27* 56* 38* 62*  --   CREATININE 1.90*   < > 1.93* 2.20* 2.21*  --  2.30*  --  2.15* 4.12* 3.00* 4.55*  --   CALCIUM 8.9   < > 9.3 9.2 9.3  --  9.3  --  9.5 9.6 8.8* 9.0  --   MG 2.7*  --  2.6*  --   --  2.7*  --  2.7*  --   --   --   --   --   PHOS 2.3*   < > 2.3* 2.9 3.1  --  3.4  --  3.1  --   --   --   --    < > = values in this interval not  displayed.   GFR: Estimated Creatinine Clearance: 17.4 mL/min (A) (by C-G formula based on SCr of 4.55 mg/dL (H)). Liver Function Tests: Recent Labs  Lab 01/25/19 0301 01/25/19 1955 01/26/19 0404 01/26/19 1827 01/27/19 0525  ALBUMIN 3.6 3.5 3.6 3.7 3.6   No results for input(s): LIPASE, AMYLASE in the last 168 hours. No results for input(s): AMMONIA in the last 168 hours. Coagulation Profile: No results for input(s): INR, PROTIME in the last 168 hours. Cardiac Enzymes: No results for input(s): CKTOTAL, CKMB, CKMBINDEX, TROPONINI in the last 168 hours. BNP (last 3 results) No results for input(s): PROBNP in the last 8760 hours. HbA1C: No results for input(s): HGBA1C in the last 72 hours. CBG: Recent Labs  Lab 01/24/19 1234  GLUCAP 113*   Lipid Profile: No results for input(s): CHOL, HDL, LDLCALC, TRIG, CHOLHDL, LDLDIRECT in the last 72 hours. Thyroid Function Tests: No results for input(s): TSH, T4TOTAL, FREET4, T3FREE, THYROIDAB in the last 72 hours. Anemia Panel: Recent Labs    01/30/19 0308  FERRITIN 724*  TIBC 305  IRON 41*   Sepsis Labs: No results for input(s): PROCALCITON, LATICACIDVEN in the last 168 hours.  Recent Results (from the past 240 hour(s))  MRSA PCR Screening     Status: None   Collection Time: 01/20/19  7:36 PM   Specimen: Nasal Mucosa; Nasopharyngeal  Result Value Ref Range Status   MRSA by PCR NEGATIVE NEGATIVE Final    Comment:        The GeneXpert MRSA Assay (FDA approved for NASAL specimens only), is one component of a comprehensive MRSA colonization surveillance program. It is not intended to diagnose MRSA infection nor to guide or monitor treatment for MRSA infections. Performed at [facility], [place] [address], [place], [place] [address]          Radiology Studies: Dg Bone Survey Met  Result Date: 01/30/2019 CLINICAL DATA:  Renal failure, myeloma EXAM: METASTATIC BONE SURVEY COMPARISON:  Chest  radiograph 01/26/2019 FINDINGS: Diffuse osseous demineralization. Extensive atherosclerotic calcifications at the carotid bifurcations, aorta, and lower extremities BILATERAL jugular lines. Enlargement of cardiac silhouette with vascular congestion. RIGHT pleural effusion and suspect minimal pulmonary edema. 5 mm lucency LEFT parietal calvarium. 10 mm lucency mid RIGHT humeral diaphysis. Questionable lucencies at the proximal humeri bilaterally. Healing fracture of the lateral RIGHT fifth rib. Questionable small lucency at mid RIGHT femoral diaphysis. No additional destructive bone lesions. Multilevel degenerative disc disease changes of the cervical, thoracic and lumbar spine. Degenerative changes of BILATERAL AC joints. IMPRESSION: Lucencies in the LEFT parietal  calvarium and in the RIGHT humeral diaphysis with additional questionable lucencies of the proximal humeri and RIGHT femoral diaphysis, cannot exclude lesions from multiple myeloma. Electronically Signed   By: Lavonia Dana M.D.   On: 01/30/2019 13:54        Scheduled Meds:  chlorhexidine gluconate (MEDLINE KIT)  15 mL Mouth Rinse BID   Chlorhexidine Gluconate Cloth  6 each Topical Q0600   feeding supplement (ENSURE ENLIVE)  237 mL Oral BID BM   feeding supplement (PRO-STAT SUGAR FREE 64)  30 mL Oral Daily   hydrocortisone sod succinate (SOLU-CORTEF) inj  50 mg Intravenous Q12H   mouth rinse  15 mL Mouth Rinse q12n4p   midodrine  10 mg Oral TID WC   nystatin   Topical TID   pantoprazole (PROTONIX) IV  40 mg Intravenous Q12H   polyethylene glycol  17 g Oral Daily   Continuous Infusions:   LOS: 16 days     Cordelia Poche, MD [facility] 01/30/2019, 3:24 PM  If 7PM-7AM, please contact night-coverage [email or web address]

## 2019-01-30 NOTE — Transfer of Care (Signed)
Immediate Anesthesia Transfer of Care Note  Patient: Scott Mcdowell  Procedure(s) Performed: ESOPHAGOGASTRODUODENOSCOPY (EGD) WITH PROPOFOL (N/A ) Aborted EGD  Patient Location: Endoscopy Unit  Anesthesia Type:MAC  Level of Consciousness: awake and alert   Airway & Oxygen Therapy: Patient Spontanous Breathing and Patient connected to nasal cannula oxygen  Post-op Assessment: Report given to RN and Post -op Vital signs reviewed and stable  Post vital signs: Reviewed and stable  Last Vitals:  Vitals Value Taken Time  BP    Temp    Pulse    Resp    SpO2      Last Pain:  Vitals:   01/30/19 1123  TempSrc: Oral  PainSc: 0-No pain      Patients Stated Pain Goal: 0 (96/29/52 8413)  Complications: No apparent anesthesia complications

## 2019-01-30 NOTE — Op Note (Signed)
[facility] Patient Name: Scott Mcdowell Procedure Date : 01/30/2019 MRN: [id number] Attending MD: Juanita Craver , MD Date of Birth: Apr 30, 1941 CSN: [id number] Age: 78 Admit Type: Inpatient Procedure:                EGD changed to an esophagoscopy-aborted procedure. Indications:              Iron deficiency anemia, Melena Providers:                Juanita Craver, MD, Carlyn Reichert, RN, William Dalton,                            Technician, Merrilyn Puma, CRNA Referring MD:             Justice Britain, MD Medicines:                Monitored Anesthesia Care Complications:            No immediate complications. Estimated Blood Loss:     Estimated blood loss: none. Procedure:                Pre-Anesthesia Assessment: - Prior to the                            procedure, a history and physical was performed,                            and patient medications and allergies were                            reviewed. The patient's tolerance of previous                            anesthesia was also reviewed. The risks and                            benefits of the procedure and the sedation options                            and risks were discussed with the patient. All                            questions were answered, and informed consent was                            obtained. Prior Anticoagulants: The patient has                            taken no previous anticoagulant or antiplatelet                            agents. ASA Grade Assessment: IV - A patient with                            severe systemic disease that is a constant threat  to life. After reviewing the risks and benefits,                            the patient was deemed in satisfactory condition to                            undergo the procedure. After obtaining informed                            consent, the endoscope was passed under direct                            vision.  Throughout the procedure, the patient's                            blood pressure, pulse, and oxygen saturations were                            monitored continuously. The GIF-[id number] ([id number])                            Olympus gastroscope was introduced through the                            mouth, and advanced to the upper third of                            esophagus. the EGD was aborted and the esophagus                            was full of debris-food. The patient tolerated the                            procedure well. Scope In: Scope Out: Findings:      Debris-old food-was found in the upper third of the esophagus.      The exam was aborted at this time. Impression:               - Food in the upper third of the esophagus.                           - No specimens collected. Moderate Sedation:      MAC used. Recommendation:           - Clear liquid diet today.                           - Continue present medications.                           - Ice chips only today.                           - We will attempt to do the EGD tomorrow. Procedure Code(s):        --- Professional ---  43200, Esophagoscopy, flexible, transoral;                            diagnostic, including collection of specimen(s) by                            brushing or washing, when performed (separate                            procedure) Diagnosis Code(s):        --- Professional ---                           D50.9, Iron deficiency anemia, unspecified                           K92.1, Melena (includes Hematochezia)                           T18.128A, Food in esophagus causing other injury,                            initial encounter CPT copyright 2019 American Medical Association. All rights reserved. The codes documented in this report are preliminary and upon coder review may  be revised to meet current compliance requirements. Juanita Craver, MD Juanita Craver, MD 01/30/2019 12:51:32  PM This report has been signed electronically. Number of Addenda: 0

## 2019-01-30 NOTE — Procedures (Signed)
Pt seen on HD, doing well, stable.  2.5 L off today on HD.   I was present at this dialysis session, have reviewed the session itself and made  appropriate changes [name] MD [place] pager [phone]   01/30/2019, 9:36 PM

## 2019-01-30 NOTE — Anesthesia Preprocedure Evaluation (Addendum)
Anesthesia Evaluation  Patient identified by MRN, date of birth, ID band Patient awake    Reviewed: Allergy & Precautions, NPO status , Patient's Chart, lab work & pertinent test results  History of Anesthesia Complications Negative for: history of anesthetic complications  Airway Mallampati: II  TM Distance: >3 FB Neck ROM: Full    Dental  (+) Missing, Loose, Poor Dentition, Dental Advisory Given   Pulmonary former smoker,  01/15/2019 SARS coronavirus NEG   breath sounds clear to auscultation       Cardiovascular hypertension, Pt. on medications + DVT (R arm)   Rhythm:Regular Rate:Normal  12/2018 ECHO: EF 60-65%, valves ok   Neuro/Psych CVA (R sided weakness), Residual Symptoms    GI/Hepatic Neg liver ROS, GI bleed   Endo/Other  negative endocrine ROS  Renal/GU Renal InsufficiencyRenal disease (creat 4.55, K+ 4.7)     Musculoskeletal   Abdominal   Peds  Hematology Lambda light chain myeloma   Anesthesia Other Findings   Reproductive/Obstetrics                            Anesthesia Physical Anesthesia Plan  ASA: III  Anesthesia Plan: MAC   Post-op Pain Management:    Induction:   PONV Risk Score and Plan: 1 and Treatment may vary due to age or medical condition  Airway Management Planned: Natural Airway and Nasal Cannula  Additional Equipment:   Intra-op Plan:   Post-operative Plan:   Informed Consent: I have reviewed the patients History and Physical, chart, labs and discussed the procedure including the risks, benefits and alternatives for the proposed anesthesia with the patient or authorized representative who has indicated his/her understanding and acceptance.     Dental advisory given  Plan Discussed with: CRNA and Surgeon  Anesthesia Plan Comments: (Pt understands and accepts that may lose extremely loose tooth during procedure)       Anesthesia Quick  Evaluation

## 2019-01-30 NOTE — Progress Notes (Signed)
Call Patient's wife to update her on the care of the patient. Scott Mcdowell

## 2019-01-30 NOTE — Progress Notes (Signed)
Spoke with pt's wife [name], updated her on pt.

## 2019-01-31 ENCOUNTER — Encounter (HOSPITAL_COMMUNITY)
Admission: AD | Disposition: A | Discharge status: Rehabilitation Facility | Payer: Self-pay | Source: Other Acute Inpatient Hospital | Attending: Family Medicine

## 2019-01-31 ENCOUNTER — Encounter (HOSPITAL_COMMUNITY): Payer: Self-pay | Admitting: *Deleted

## 2019-01-31 ENCOUNTER — Inpatient Hospital Stay (HOSPITAL_COMMUNITY): Payer: Medicare Other | Admitting: Certified Registered"

## 2019-01-31 HISTORY — PX: ESOPHAGOGASTRODUODENOSCOPY (EGD) WITH PROPOFOL: SHX5813

## 2019-01-31 LAB — CBC
HCT: 23.5 % — ABNORMAL LOW (ref 39.0–52.0)
Hemoglobin: 7.5 g/dL — ABNORMAL LOW (ref 13.0–17.0)
MCH: 32.3 pg (ref 26.0–34.0)
MCHC: 31.9 g/dL (ref 30.0–36.0)
MCV: 101.3 fL — ABNORMAL HIGH (ref 80.0–100.0)
Platelets: 103 10*3/uL — ABNORMAL LOW (ref 150–400)
RBC: 2.32 MIL/uL — ABNORMAL LOW (ref 4.22–5.81)
RDW: 18 % — ABNORMAL HIGH (ref 11.5–15.5)
WBC: 5.5 10*3/uL (ref 4.0–10.5)
nRBC: 0 % (ref 0.0–0.2)

## 2019-01-31 LAB — BASIC METABOLIC PANEL
Anion gap: 12 (ref 5–15)
BUN: 38 mg/dL — ABNORMAL HIGH (ref 8–23)
CO2: 25 mmol/L (ref 22–32)
Calcium: 8.9 mg/dL (ref 8.9–10.3)
Chloride: 98 mmol/L (ref 98–111)
Creatinine, Ser: 3.63 mg/dL — ABNORMAL HIGH (ref 0.61–1.24)
GFR calc Af Amer: 17 mL/min — ABNORMAL LOW (ref >60)
GFR calc non Af Amer: 15 mL/min — ABNORMAL LOW (ref >60)
Glucose, Bld: 95 mg/dL (ref 70–99)
Potassium: 4.5 mmol/L (ref 3.5–5.1)
Sodium: 135 mmol/L (ref 135–145)

## 2019-01-31 LAB — GLUCOSE, CAPILLARY: Glucose-Capillary: 85 mg/dL (ref 70–99)

## 2019-01-31 SURGERY — EGD (ESOPHAGOGASTRODUODENOSCOPY)
Anesthesia: Monitor Anesthesia Care | Laterality: Left

## 2019-01-31 SURGERY — ESOPHAGOGASTRODUODENOSCOPY (EGD) WITH PROPOFOL
Anesthesia: Monitor Anesthesia Care

## 2019-01-31 MED ORDER — HYDROCORTISONE NA SUCCINATE PF 100 MG IJ SOLR
50.0000 mg | Freq: Every day | INTRAMUSCULAR | Status: DC
  Administered 2019-02-01 – 2019-02-02 (×2): 50 mg via INTRAVENOUS
  Filled 2019-01-31 (×2): qty 2

## 2019-01-31 MED ORDER — SODIUM CHLORIDE 0.9 % IV SOLN
INTRAVENOUS | Status: DC
  Administered 2019-01-31: 11:00:00 via INTRAVENOUS

## 2019-01-31 MED ORDER — PHENYLEPHRINE 40 MCG/ML (10ML) SYRINGE FOR IV PUSH (FOR BLOOD PRESSURE SUPPORT)
PREFILLED_SYRINGE | INTRAVENOUS | Status: DC | PRN
  Administered 2019-01-31 (×2): 40 ug via INTRAVENOUS

## 2019-01-31 MED ORDER — PROPOFOL 500 MG/50ML IV EMUL
INTRAVENOUS | Status: DC | PRN
  Administered 2019-01-31: 100 ug/kg/min via INTRAVENOUS

## 2019-01-31 SURGICAL SUPPLY — 14 items

## 2019-01-31 NOTE — Anesthesia Postprocedure Evaluation (Signed)
Anesthesia Post Note  Patient: PARK BECK  Procedure(s) Performed: ESOPHAGOGASTRODUODENOSCOPY (EGD) WITH PROPOFOL (N/A )     Patient location during evaluation: Endoscopy Anesthesia Type: MAC Level of consciousness: awake and alert Pain management: pain level controlled Vital Signs Assessment: post-procedure vital signs reviewed and stable Respiratory status: spontaneous breathing, nonlabored ventilation and respiratory function stable Cardiovascular status: stable and blood pressure returned to baseline Postop Assessment: no apparent nausea or vomiting Anesthetic complications: no    Last Vitals:  Vitals:   01/31/19 1210 01/31/19 1220  BP: (!) 89/47 (!) 92/49  Pulse: 80 79  Resp: 17 19  Temp:    SpO2: 94% 93%    Last Pain:  Vitals:   01/31/19 1220  TempSrc:   PainSc: 0-No pain                 Alesandro Stueve,W. EDMOND

## 2019-01-31 NOTE — Progress Notes (Signed)
Patient ID: Scott Mcdowell, male   DOB: [birth year], 78 y.o.   MRN: [id number]  [facility] Progress Note   Assessment/ Plan:   1. 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  Volume overload/ hypotension:  - CXR clear 6/9 - UF as tol on HD , ~ 2.5 L off yest - tight fluid restriction 4.  Anemia/ renal failure - seen by ONC 6/12, sp BM bx, they are planning on treating him most likely 3.  Anemia d/t myeloma/ ABL/ acute GI bleed: - transfuse prn 4.  Right arm brachial vein DVT: - IV hep dc'd due to GIB 5.  Vitamin D deficiency: Continue cholecalciferol supplementation when stabilized 6.  Upper GI bleed:  - duodenal bulb region was treated endoscopically 7.  CAP - treated on admission for pna w/ IV abx 8.  Hx of CVA - R hemiparesis    [name], MD 01/31/2019, 4:43 PM  CXR 6/9 - resolution of IS pattern/ edema  Subjective:   No new c/o's.     Objective:   BP (!) 92/49   Pulse 79   Temp 98.9 F (37.2 C) (Temporal)   Resp 19   Ht '6\' 2"'  (1.88 m)   Wt 107.2 kg   SpO2 93%   BMI 30.34 kg/m   Intake/Output Summary (Last 24 hours) at 01/31/2019 1643 Last data filed at 01/31/2019 1300 Gross per 24 hour  Intake 280 ml  Output -  Net 280 ml   Weight change: 0 kg  Physical Exam: Gen: Awake/alert, seen in room, R IJ temp cath CVS: Pulse regular rhythm, normal rate, S1-S2 normal Resp: Anteriorly clear to auscultation, no rales Abd: Soft, obese, nontender Ext: 2+ RUE edema (stroke arm), 1+ hip edema bilat  Imaging: Dg Bone Survey Met  Result Date: 01/30/2019 CLINICAL DATA:  Renal failure, myeloma EXAM: METASTATIC BONE SURVEY COMPARISON:  Chest radiograph 01/26/2019 FINDINGS: Diffuse osseous demineralization. Extensive  atherosclerotic calcifications at the carotid bifurcations, aorta, and lower extremities BILATERAL jugular lines. Enlargement of cardiac silhouette with vascular congestion. RIGHT pleural effusion and suspect minimal pulmonary edema. 5 mm lucency LEFT parietal calvarium. 10 mm lucency mid RIGHT humeral diaphysis. Questionable lucencies at the proximal humeri bilaterally. Healing fracture of the lateral RIGHT fifth rib. Questionable small lucency at mid RIGHT femoral diaphysis. No additional destructive bone lesions. Multilevel degenerative disc disease changes of the cervical, thoracic and lumbar spine. Degenerative changes of BILATERAL AC joints. IMPRESSION: Lucencies in the LEFT parietal calvarium and in the RIGHT humeral diaphysis with additional questionable lucencies of the proximal humeri and RIGHT femoral diaphysis, cannot exclude lesions from multiple myeloma. Electronically Signed   By: Lavonia Dana M.D.   On: 01/30/2019 13:54    Labs: BMET Recent Labs  Lab 01/25/19 0301 01/25/19 1955 01/26/19 0404 01/26/19 1827 01/27/19 0525 01/28/19 0830 01/29/19 0336 01/30/19 0308 01/30/19 1203 01/31/19 0509  NA 136 135 134* 135 136 135 136 136 135 135  K 4.6 5.1 5.0 5.0 4.8 4.6 4.0 4.7 4.2 4.5  CL 102 102 101 103 103 101 99 99  --  98  CO2 '26 25 24 24 25 24 26 25  ' --  25  GLUCOSE 115* 122* 115* 117* 98 126*  117* 124* 92 95  BUN '17 19 22 ' 26* 27* 56* 38* 62*  --  38*  CREATININE 1.93* 2.20* 2.21* 2.30* 2.15* 4.12* 3.00* 4.55*  --  3.63*  CALCIUM 9.3 9.2 9.3 9.3 9.5 9.6 8.8* 9.0  --  8.9  PHOS 2.3* 2.9 3.1 3.4 3.1  --   --   --   --   --    CBC Recent Labs  Lab 01/25/19 0301 01/26/19 0406 01/27/19 1059 01/29/19 0336 01/30/19 0308 01/30/19 1203 01/31/19 0509  WBC 11.9* 11.1* 8.2 7.1 7.6  --  5.5  NEUTROABS 7.3 8.0*  --   --   --   --   --   HGB 7.5* 7.2* 7.3* 6.8* 7.4* 7.8* 7.5*  HCT 23.5* 22.5* 22.9* 21.6* 23.5* 23.0* 23.5*  MCV 102.6* 103.7* 103.6* 102.4* 102.2*  --  101.3*  PLT  105* 97* 94* 93* 95*  --  103*   Medications:    . chlorhexidine gluconate (MEDLINE KIT)  15 mL Mouth Rinse BID  . Chlorhexidine Gluconate Cloth  6 each Topical Q0600  . feeding supplement (ENSURE ENLIVE)  237 mL Oral BID BM  . feeding supplement (PRO-STAT SUGAR FREE 64)  30 mL Oral Daily  . [START ON 02/01/2019] hydrocortisone sod succinate (SOLU-CORTEF) inj  50 mg Intravenous Daily  . mouth rinse  15 mL Mouth Rinse q12n4p  . midodrine  10 mg Oral TID WC  . nystatin   Topical TID  . pantoprazole (PROTONIX) IV  40 mg Intravenous Q12H  . polyethylene glycol  17 g Oral Daily

## 2019-01-31 NOTE — Anesthesia Preprocedure Evaluation (Addendum)
Anesthesia Evaluation  Patient identified by MRN, date of birth, ID band Patient awake    Reviewed: Allergy & Precautions, H&P , NPO status , Patient's Chart, lab work & pertinent test results  Airway Mallampati: II  TM Distance: >3 FB Neck ROM: Full    Dental no notable dental hx. (+) Poor Dentition, Dental Advisory Given   Pulmonary neg pulmonary ROS, former smoker,    Pulmonary exam normal breath sounds clear to auscultation       Cardiovascular hypertension, Pt. on medications  Rhythm:Regular Rate:Normal     Neuro/Psych CVA negative psych ROS   GI/Hepatic negative GI ROS, Neg liver ROS,   Endo/Other  negative endocrine ROS  Renal/GU Renal InsufficiencyRenal disease  negative genitourinary   Musculoskeletal   Abdominal   Peds  Hematology  (+) Blood dyscrasia, anemia ,   Anesthesia Other Findings   Reproductive/Obstetrics negative OB ROS                            Anesthesia Physical Anesthesia Plan  ASA: III  Anesthesia Plan: MAC   Post-op Pain Management:    Induction: Intravenous  PONV Risk Score and Plan: 1 and Propofol infusion  Airway Management Planned: Nasal Cannula  Additional Equipment:   Intra-op Plan:   Post-operative Plan:   Informed Consent: I have reviewed the patients History and Physical, chart, labs and discussed the procedure including the risks, benefits and alternatives for the proposed anesthesia with the patient or authorized representative who has indicated his/her understanding and acceptance.     Dental advisory given  Plan Discussed with: CRNA  Anesthesia Plan Comments:         Anesthesia Quick Evaluation

## 2019-01-31 NOTE — Op Note (Signed)
[facility] Patient Name: Scott Mcdowell Common Procedure Date : 01/31/2019 MRN: [id number] Attending MD: Juanita Craver , MD Date of Birth: 1941/02/19 CSN: [id number] Age: 78 Admit Type: Inpatient Procedure:                Diagnostic EGD. Indications:              Acute post hemorrhagic anemia, Hematochezia. Providers:                Juanita Craver, MD, Baird Cancer, RN, Cherylynn Ridges,                            Technician, Patricia "Trish" Durene Romans, CRNA Referring MD:             Justice Britain, MD Medicines:                Monitored Anesthesia Care Complications:            No immediate complications. Estimated Blood Loss:     Estimated blood loss: none. Procedure:                Pre-Anesthesia Assessment: - Prior to the                            procedure, a history and physical was performed,                            and patient medications and allergies were                            reviewed. The patient's tolerance of previous                            anesthesia was also reviewed. The risks and                            benefits of the procedure and the sedation options                            and risks were discussed with the patient. All                            questions were answered, and informed consent was                            obtained. Prior Anticoagulants: The patient has                            taken no previous anticoagulant or antiplatelet                            agents. ASA Grade Assessment: IV - A patient with                            severe systemic disease that is a constant threat  to life. After reviewing the risks and benefits,                            the patient was deemed in satisfactory condition to                            undergo the procedure. After obtaining informed                            consent, the endoscope was passed under direct                            vision. Throughout  the procedure, the patient's                            blood pressure, pulse, and oxygen saturations were                            monitored continuously. The GIF-H190 ([id number])                            Olympus gastroscope was introduced through the                            mouth, and advanced to the second part of duodenum.                            The EGD was accomplished without difficulty. The                            patient tolerated the procedure well. Scope In: Scope Out: Findings:      The examined esophagus and the GEJ appeared widely patent and normal.      Patchy moderate inflammation characterized by erythema, friability and       granularity was found in the stomach.      A small hiatal hernia was noted on retroflexion.      Patchy moderately erythematous mucosa was found in the duodenal bulb-no       ulcers were noted.      No fresh or old heme was noted in the UGI tract. Impression:               - Normal appearing, widely patent esophagus and GEJ.                           - Moderate patchy gastritis.                           - Small hiatal hernia.                           - Erythematous duodenopathy.                           - No specimens collected. Recommendation:           - Full liquid  diet today.                           - Continue present medications.                           - Serial CBC's. Procedure Code(s):        --- Professional ---                           [phone], Esophagogastroduodenoscopy, flexible,                            transoral; diagnostic, including collection of                            specimen(s) by brushing or washing, when performed                            (separate procedure) Diagnosis Code(s):        --- Professional ---                           D62, Acute posthemorrhagic anemia                           K92.1, Melena (includes Hematochezia)                           K29.70, Gastritis, unspecified, without  bleeding                           K44.9, Diaphragmatic hernia without obstruction or                            gangrene CPT copyright 2019 [employer]. All rights reserved. The codes documented in this report are preliminary and upon coder review may  be revised to meet current compliance requirements. Juanita Craver, MD Juanita Craver, MD 01/31/2019 12:14:17 PM This report has been signed electronically. Number of Addenda: 0

## 2019-01-31 NOTE — Progress Notes (Signed)
Call patient's wife [name] and and gave her update on how the patient is doing. She has additional questions for the MD will inform the MD. Arlyss Repress

## 2019-01-31 NOTE — Transfer of Care (Signed)
Immediate Anesthesia Transfer of Care Note  Patient: Scott Mcdowell  Procedure(s) Performed: ESOPHAGOGASTRODUODENOSCOPY (EGD) WITH PROPOFOL (N/A )  Patient Location: PACU  Anesthesia Type:MAC  Level of Consciousness: drowsy, patient cooperative and responds to stimulation  Airway & Oxygen Therapy: Patient Spontanous Breathing and Patient connected to nasal cannula oxygen  Post-op Assessment: Report given to RN and Post -op Vital signs reviewed and stable  Post vital signs: Reviewed and stable  Last Vitals:  Vitals Value Taken Time  BP 82/49 01/31/19 1201  Temp 37.2 C 01/31/19 1201  Pulse 73 01/31/19 1201  Resp 18 01/31/19 1201  SpO2 99 % 01/31/19 1201  Vitals shown include unvalidated device data.  Last Pain:  Vitals:   01/31/19 1201  TempSrc: Temporal  PainSc: 0-No pain      Patients Stated Pain Goal: 0 (41/58/30 9407)  Complications: No apparent anesthesia complications

## 2019-01-31 NOTE — Progress Notes (Signed)
[name], pt's wife, called and updated. Questions answered.

## 2019-01-31 NOTE — Progress Notes (Signed)
PROGRESS NOTE    Scott Mcdowell  NWG:[id number] DOB: 07-17-1941 DOA: 01/14/2019 PCP: [name], MD   Brief Narrative: Scott Mcdowell is a 78 y.o. male presenting with edema. Patient admitted with pneumonia and his hospital course has been complicated by AKI requiring renal replacement therapy, circulatory shock requiring vasopressors, respiratory failure requiring brief intubation, acute blood loss anemia from bleeding upper GI sources requiring EGD intervention and right upper extremity DVT requiring anticoagulation prior to being now complicated by upper GI bleeding.    Assessment & Plan:   Principal Problem:   Shock ([place]) Active Problems:   ARF (acute renal failure) (HCC)   Hyperkalemia   B12 deficiency anemia   Pleural effusion on right   Ascites   CAP (community acquired pneumonia)   Elevated troponin   Acute blood loss anemia   Upper GI bleed   Goals of care, counseling/discussion   Lambda light chain myeloma (HCC)   Pressure injury of skin   AKI secondary to ATN Hyperkalemia Initially managed with diuresis with poor effect. While in ICU, he was started on CRRT. He is now transitioned to IHD with the hopes of return of renal function. No urine output documented.  -Nephrology recommendations: HD -Strict urine output  Hypotension/shock Patient required the use of vasopressors while in the ICU. Concern for relative adrenal insufficiency in the ICU and has been started on hydrocortisone in addition to midodrine. Blood pressure still soft and acutely worsened in setting of worsening anemia and recurrent melena -Continue hydrocortisone taper -Continue midodrine -Vitals q6 hours  Anemia Thrombocytopenia Acute blood loss anemia Upper GI bleeding Hemoglobin stable. Recent EGD performed on 6/4 and significant for multiple areas of bleeding which were treated with clip and epi. Iron studies normal except a mildly elevated ferritin. GI signed off and  recommending to restart anticoagulation as indicated. Unfortunately, evidence he is possibly re-bleeding. To date, he has had 8 units of PRBC, 1 units of platelets and 3 units of FFP. -Daily CBC -GI recommendations: Repeat EGD planned for today (6/14)  Right upper extremity DVT Treatment complicated by anemia requiring multiple transfusions, in addition to epistaxis and hemoptysis. Hemoglobin stable currently. -Will likely need to start Coumadin in light of renal disease, however, still high risk for bleeding  Pneumonia Associated right para-pneumonia effusion. Completed antibiotic course. Also underwent thoracentesis. Resolved  Lytic rib lesion Concern for possible malignancy. ?MM. 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  History of CVA Associated right sided deficits. Per patient, he was able to ambulate pretty well prior to current illness. PT/OT recommending CIR. CIR recommends consideration when patient is closer to discharge/oncology recs  Pressure injuries Sacrum, left heel, right heel, POA  Cirrhosis Seen on CT. Will need outpatient follow-up.  Obesity Body mass index is 30.34 kg/m.   DVT prophylaxis: SCDs Code Status:   Code Status: Full Code Family Communication: None Disposition Plan: Possible CIR if able vs SNF pending management of kidney failure   Consultants:   PCCM  Nephrology  [facility]  Procedures:   6/4: EGD Impression:               - Normal esophagus.                           - Adherent clot in the distal gastric body, unclear  if NG tube trauma but treated with hemostasis clip                            and epi. Friable mucosa in the proximal stomach                            with contact oozing.                           - Red blood with active bleeding and clots in the                             duodenal bulb. 4 discrete areas of adherent clot                            with extremely friable tissue but no focal                            ulceration appreciated. One of these areas had a                            visible vessel which was bleeding, another lesion                            had active oozing. 2 areas treated as outlined                            above. 4 clips placed total in the duodenal bulb.                            Patient with lytic lesions noted on xray, unclear                            if he has underlying myeloma? These lesions appear                            to have high risk for relbeeding in the setting of                            coagulopathy. No active bleeding noted following                            endoscopic intervention.  Recommendation:           - Patient to remain in the ICU for ongoing care.                           - NPO.                           - Continue present medications including IV PPI                           - Serial  Hgb                           - Further workup pending course. If he survives                            these acute issues, may need repeat EGD to                            re-evaluate duodenal bulb following treatment with                            PPI and workup underlying liver disease  Antimicrobials:  Azithro 5/29 >> 5/31   Vanco 5/29 >> 5/30  Cefepime 5/30 >> 6/4    Subjective: No issues overnight. Hungry and wants to eat. Thirsty as well.  Objective: Vitals:   01/30/19 2000 01/30/19 2030 01/30/19 2038 01/30/19 2222  BP: (!) 87/46 (!) 83/47 (!) 87/48 (!) 89/53  Pulse: 77 78 79 83  Resp: '17 17 18   '$ Temp:   98.5 F (36.9 C)   TempSrc:   Oral   SpO2:   96% 96%  Weight:   107.2 kg   Height:        Intake/Output Summary (Last 24 hours) at 01/31/2019 0924 Last data filed at 01/31/2019 0600 Gross per 24 hour  Intake 150 ml  Output -  Net 150 ml   Filed Weights    01/30/19 1123 01/30/19 1655 01/30/19 2038  Weight: 110.8 kg 109.5 kg 107.2 kg    Examination:  General exam: Appears calm and comfortable Mouth: poor dentition Respiratory system: Clear to auscultation. Respiratory effort normal. Cardiovascular system: S1 & S2 heard, RRR. 2/6 systolic murmur Gastrointestinal system: Abdomen is nondistended, soft and nontender. No organomegaly or masses felt. Normal bowel sounds heard. Central nervous system: Alert and oriented. No focal neurological deficits. Extremities: RUE, bilateral ankle edema. No calf tenderness Skin: No cyanosis. No rashes Psychiatry: Judgement and insight appear normal. Mood & affect appropriate.     Data Reviewed: I have personally reviewed following labs and imaging studies  CBC: Recent Labs  Lab 01/25/19 0301 01/26/19 0406 01/27/19 1059 01/29/19 0336 01/30/19 0308 01/30/19 1203 01/31/19 0509  WBC 11.9* 11.1* 8.2 7.1 7.6  --  5.5  NEUTROABS 7.3 8.0*  --   --   --   --   --   HGB 7.5* 7.2* 7.3* 6.8* 7.4* 7.8* 7.5*  HCT 23.5* 22.5* 22.9* 21.6* 23.5* 23.0* 23.5*  MCV 102.6* 103.7* 103.6* 102.4* 102.2*  --  101.3*  PLT 105* 97* 94* 93* 95*  --  440*   Basic Metabolic Panel: Recent Labs  Lab 01/25/19 0301 01/25/19 1955 01/26/19 0404 01/26/19 0406 01/26/19 1827 01/27/19 0524 01/27/19 0525 01/28/19 0830 01/29/19 0336 01/30/19 0308 01/30/19 1203 01/31/19 0509  NA 136 135 134*  --  135  --  136 135 136 136 135 135  K 4.6 5.1 5.0  --  5.0  --  4.8 4.6 4.0 4.7 4.2 4.5  CL 102 102 101  --  103  --  103 101 99 99  --  98  CO2 '26 25 24  '$ --  24  --  '25 24 26 25  '$ --  25  GLUCOSE 115* 122* 115*  --  117*  --  98  126* 117* 124* 92 95  BUN '17 19 22  '$ --  26*  --  27* 56* 38* 62*  --  38*  CREATININE 1.93* 2.20* 2.21*  --  2.30*  --  2.15* 4.12* 3.00* 4.55*  --  3.63*  CALCIUM 9.3 9.2 9.3  --  9.3  --  9.5 9.6 8.8* 9.0  --  8.9  MG 2.6*  --   --  2.7*  --  2.7*  --   --   --   --   --   --   PHOS 2.3* 2.9 3.1  --   3.4  --  3.1  --   --   --   --   --    GFR: Estimated Creatinine Clearance: 21.5 mL/min (A) (by C-G formula based on SCr of 3.63 mg/dL (H)). Liver Function Tests: Recent Labs  Lab 01/25/19 0301 01/25/19 1955 01/26/19 0404 01/26/19 1827 01/27/19 0525  ALBUMIN 3.6 3.5 3.6 3.7 3.6   No results for input(s): LIPASE, AMYLASE in the last 168 hours. No results for input(s): AMMONIA in the last 168 hours. Coagulation Profile: No results for input(s): INR, PROTIME in the last 168 hours. Cardiac Enzymes: No results for input(s): CKTOTAL, CKMB, CKMBINDEX, TROPONINI in the last 168 hours. BNP (last 3 results) No results for input(s): PROBNP in the last 8760 hours. HbA1C: No results for input(s): HGBA1C in the last 72 hours. CBG: Recent Labs  Lab 01/24/19 1234  GLUCAP 113*   Lipid Profile: No results for input(s): CHOL, HDL, LDLCALC, TRIG, CHOLHDL, LDLDIRECT in the last 72 hours. Thyroid Function Tests: No results for input(s): TSH, T4TOTAL, FREET4, T3FREE, THYROIDAB in the last 72 hours. Anemia Panel: Recent Labs    01/30/19 0308  FERRITIN 724*  TIBC 305  IRON 41*   Sepsis Labs: No results for input(s): PROCALCITON, LATICACIDVEN in the last 168 hours.  No results found for this or any previous visit (from the past 240 hour(s)).       Radiology Studies: Dg Bone Survey Met  Result Date: 01/30/2019 CLINICAL DATA:  Renal failure, myeloma EXAM: METASTATIC BONE SURVEY COMPARISON:  Chest radiograph 01/26/2019 FINDINGS: Diffuse osseous demineralization. Extensive atherosclerotic calcifications at the carotid bifurcations, aorta, and lower extremities BILATERAL jugular lines. Enlargement of cardiac silhouette with vascular congestion. RIGHT pleural effusion and suspect minimal pulmonary edema. 5 mm lucency LEFT parietal calvarium. 10 mm lucency mid RIGHT humeral diaphysis. Questionable lucencies at the proximal humeri bilaterally. Healing fracture of the lateral RIGHT fifth rib.  Questionable small lucency at mid RIGHT femoral diaphysis. No additional destructive bone lesions. Multilevel degenerative disc disease changes of the cervical, thoracic and lumbar spine. Degenerative changes of BILATERAL AC joints. IMPRESSION: Lucencies in the LEFT parietal calvarium and in the RIGHT humeral diaphysis with additional questionable lucencies of the proximal humeri and RIGHT femoral diaphysis, cannot exclude lesions from multiple myeloma. Electronically Signed   By: Lavonia Dana M.D.   On: 01/30/2019 13:54        Scheduled Meds: . chlorhexidine gluconate (MEDLINE KIT)  15 mL Mouth Rinse BID  . Chlorhexidine Gluconate Cloth  6 each Topical Q0600  . feeding supplement (ENSURE ENLIVE)  237 mL Oral BID BM  . feeding supplement (PRO-STAT SUGAR FREE 64)  30 mL Oral Daily  . hydrocortisone sod succinate (SOLU-CORTEF) inj  50 mg Intravenous Q12H  . mouth rinse  15 mL Mouth Rinse q12n4p  . midodrine  10 mg Oral TID WC  . nystatin   Topical  TID  . pantoprazole (PROTONIX) IV  40 mg Intravenous Q12H  . polyethylene glycol  17 g Oral Daily   Continuous Infusions:   LOS: 17 days     Cordelia Poche, MD [facility] 01/31/2019, 9:24 AM  If 7PM-7AM, please contact night-coverage [email or web address]

## 2019-02-01 LAB — BASIC METABOLIC PANEL
Anion gap: 11 (ref 5–15)
BUN: 60 mg/dL — ABNORMAL HIGH (ref 8–23)
CO2: 26 mmol/L (ref 22–32)
Calcium: 8.9 mg/dL (ref 8.9–10.3)
Chloride: 98 mmol/L (ref 98–111)
Creatinine, Ser: 4.96 mg/dL — ABNORMAL HIGH (ref 0.61–1.24)
GFR calc Af Amer: 12 mL/min — ABNORMAL LOW (ref >60)
GFR calc non Af Amer: 10 mL/min — ABNORMAL LOW (ref >60)
Glucose, Bld: 100 mg/dL — ABNORMAL HIGH (ref 70–99)
Potassium: 4.5 mmol/L (ref 3.5–5.1)
Sodium: 135 mmol/L (ref 135–145)

## 2019-02-01 LAB — ERYTHROPOIETIN: Erythropoietin: 17.9 m[IU]/mL (ref 2.6–18.5)

## 2019-02-01 MED ORDER — SODIUM CHLORIDE 0.9 % IV SOLN
510.0000 mg | Freq: Once | INTRAVENOUS | Status: AC
  Administered 2019-02-01: 510 mg via INTRAVENOUS
  Filled 2019-02-01: qty 17

## 2019-02-01 MED ORDER — SODIUM CHLORIDE 0.9 % IV SOLN
INTRAVENOUS | Status: DC | PRN
  Administered 2019-02-01: 500 mL via INTRAVENOUS

## 2019-02-01 MED ORDER — DEXAMETHASONE SODIUM PHOSPHATE 10 MG/ML IJ SOLN
20.0000 mg | INTRAMUSCULAR | Status: AC
  Administered 2019-02-02 – 2019-02-04 (×3): 20 mg via INTRAVENOUS
  Filled 2019-02-01: qty 5
  Filled 2019-02-01: qty 2
  Filled 2019-02-01: qty 5
  Filled 2019-02-01: qty 2
  Filled 2019-02-01: qty 5
  Filled 2019-02-01: qty 2

## 2019-02-01 MED ORDER — RENA-VITE PO TABS
1.0000 | ORAL_TABLET | Freq: Every day | ORAL | Status: DC
  Administered 2019-02-01 – 2019-02-09 (×9): 1 via ORAL
  Filled 2019-02-01 (×9): qty 1

## 2019-02-01 MED ORDER — DARBEPOETIN ALFA 200 MCG/0.4ML IJ SOSY
200.0000 ug | PREFILLED_SYRINGE | INTRAMUSCULAR | Status: DC
  Administered 2019-02-02 – 2019-02-09 (×2): 200 ug via INTRAVENOUS
  Filled 2019-02-01 (×2): qty 0.4

## 2019-02-01 MED ORDER — CHLORHEXIDINE GLUCONATE CLOTH 2 % EX PADS: 6.0000 | MEDICATED_PAD | Freq: Every day | CUTANEOUS | Status: DC

## 2019-02-01 NOTE — Progress Notes (Signed)
PROGRESS NOTE    COLESTON DIROSA  HYQ:[id number] DOB: September 27, 1940 DOA: 01/14/2019 PCP: [name], MD   Brief Narrative: Scott Mcdowell is a 78 y.o. male presenting with edema. Patient admitted with pneumonia and his hospital course has been complicated by AKI requiring renal replacement therapy, circulatory shock requiring vasopressors, respiratory failure requiring brief intubation, acute blood loss anemia from bleeding upper GI sources requiring EGD intervention and right upper extremity DVT requiring anticoagulation prior to being now complicated by upper GI bleeding.    Assessment & Plan:   Principal Problem:   Shock ([place]) Active Problems:   ARF (acute renal failure) (HCC)   Hyperkalemia   B12 deficiency anemia   Pleural effusion on right   Ascites   CAP (community acquired pneumonia)   Elevated troponin   Acute blood loss anemia   Upper GI bleed   Goals of care, counseling/discussion   Lambda light chain myeloma (HCC)   Pressure injury of skin   AKI secondary to ATN Hyperkalemia Initially managed with diuresis with poor effect. While in ICU, he was started on CRRT. He is now transitioned to IHD with the hopes of return of renal function. No urine output documented.  -Nephrology recommendations: HD -Strict urine output  Hypotension/shock Patient required the use of vasopressors while in the ICU. Concern for relative adrenal insufficiency in the ICU and has been started on hydrocortisone in addition to midodrine. Blood pressure still soft and acutely worsened in setting of worsening anemia and recurrent melena -Continue hydrocortisone taper -Continue midodrine -Vitals q6 hours  Anemia Thrombocytopenia Acute blood loss anemia Upper GI bleeding Hemoglobin stable. Recent EGD performed on 6/4 and significant for multiple areas of bleeding which were treated with clip and epi. Iron studies normal except a mildly elevated ferritin. GI signed off and  recommending to restart anticoagulation as indicated. Unfortunately, evidence he is possibly re-bleeding. To date, he has had 8 units of PRBC, 1 units of platelets and 3 units of FFP. Repeat EGD (6/14) without source of active bleeding. -Daily CBC  Right upper extremity DVT Treatment complicated by anemia requiring multiple transfusions, in addition to epistaxis and hemoptysis. Hemoglobin stable currently. -Will likely need to start Coumadin in light of renal disease, however, still high risk for bleeding  Pneumonia Associated right para-pneumonia effusion. Completed antibiotic course. Also underwent thoracentesis. 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  History of CVA Associated right sided deficits. Per patient, he was able to ambulate pretty well prior to current illness. PT/OT recommending CIR. CIR recommends consideration when patient is closer to discharge/oncology recs.  Pressure injuries Sacrum, left heel, right heel, POA  Cirrhosis Seen on CT. Will need outpatient follow-up.  Obesity Body mass index is 31.28 kg/m.   DVT prophylaxis: SCDs Code Status:   Code Status: Full Code Family Communication: None Disposition Plan: Possible CIR if able vs SNF pending management of kidney failure   Consultants:   PCCM  Nephrology  [facility]  Procedures:   6/4: EGD Impression:               - Normal esophagus.                           - Adherent clot in the distal gastric body, unclear  if NG tube trauma but treated with hemostasis clip                            and epi. Friable mucosa in the proximal stomach                            with contact oozing.                           - Red blood with active bleeding and clots in the                             duodenal bulb. 4 discrete areas of adherent clot                            with extremely friable tissue but no focal                            ulceration appreciated. One of these areas had a                            visible vessel which was bleeding, another lesion                            had active oozing. 2 areas treated as outlined                            above. 4 clips placed total in the duodenal bulb.                            Patient with lytic lesions noted on xray, unclear                            if he has underlying myeloma? These lesions appear                            to have high risk for relbeeding in the setting of                            coagulopathy. No active bleeding noted following                            endoscopic intervention.  Recommendation:           - Patient to remain in the ICU for ongoing care.                           - NPO.                           - Continue present medications including IV PPI                           - Serial  Hgb                           - Further workup pending course. If he survives                            these acute issues, may need repeat EGD to                            re-evaluate duodenal bulb following treatment with                            PPI and workup underlying liver disease   6/14: EGD Impression:               - Normal appearing, widely patent esophagus and GEJ.                           - Moderate patchy gastritis.                           - Small hiatal hernia.                           - Erythematous duodenopathy.                           - No specimens collected. Recommendation:           - Full liquid diet today.                           - Continue present medications.                           - Serial CBC's.  Antimicrobials:  Azithro 5/29 >> 5/31   Vanco 5/29 >> 5/30  Cefepime 5/30 >> 6/4    Subjective: No noted issues overnight  Objective: Vitals:   01/31/19  1220 01/31/19 2159 02/01/19 0424 02/01/19 0849  BP: (!) 92/49 94/62 (!) 90/59 (!) 92/54  Pulse: 79 72 80 88  Resp: '19 16 16 18  '$ Temp:  98.1 F (36.7 C) 98 F (36.7 C) 97.9 F (36.6 C)  TempSrc:  Oral Oral Oral  SpO2: 93% 98% 96% 97%  Weight:   110.5 kg   Height:        Intake/Output Summary (Last 24 hours) at 02/01/2019 1217 Last data filed at 02/01/2019 0900 Gross per 24 hour  Intake 900 ml  Output 0 ml  Net 900 ml   Filed Weights   01/30/19 1655 01/30/19 2038 02/01/19 0424  Weight: 109.5 kg 107.2 kg 110.5 kg    Examination:  General exam: Appears calm and comfortable Respiratory system: Respiratory effort normal.    Data Reviewed: I have personally reviewed following labs and imaging studies  CBC: Recent Labs  Lab 01/26/19 0406 01/27/19 1059 01/29/19 0336 01/30/19 0308 01/30/19 1203 01/31/19 0509  WBC 11.1* 8.2 7.1 7.6  --  5.5  NEUTROABS 8.0*  --   --   --   --   --   HGB 7.2* 7.3* 6.8* 7.4* 7.8* 7.5*  HCT 22.5* 22.9* 21.6*  23.5* 23.0* 23.5*  MCV 103.7* 103.6* 102.4* 102.2*  --  101.3*  PLT 97* 94* 93* 95*  --  350*   Basic Metabolic Panel: Recent Labs  Lab 01/25/19 1955 01/26/19 0404 01/26/19 0406 01/26/19 1827 01/27/19 0524 01/27/19 0525 01/28/19 0830 01/29/19 0336 01/30/19 0308 01/30/19 1203 01/31/19 0509 02/01/19 0323  NA 135 134*  --  135  --  136 135 136 136 135 135 135  K 5.1 5.0  --  5.0  --  4.8 4.6 4.0 4.7 4.2 4.5 4.5  CL 102 101  --  103  --  103 101 99 99  --  98 98  CO2 25 24  --  24  --  '25 24 26 25  '$ --  25 26  GLUCOSE 122* 115*  --  117*  --  98 126* 117* 124* 92 95 100*  BUN 19 22  --  26*  --  27* 56* 38* 62*  --  38* 60*  CREATININE 2.20* 2.21*  --  2.30*  --  2.15* 4.12* 3.00* 4.55*  --  3.63* 4.96*  CALCIUM 9.2 9.3  --  9.3  --  9.5 9.6 8.8* 9.0  --  8.9 8.9  MG  --   --  2.7*  --  2.7*  --   --   --   --   --   --   --   PHOS 2.9 3.1  --  3.4  --  3.1  --   --   --   --   --   --    GFR: Estimated Creatinine  Clearance: 16 mL/min (A) (by C-G formula based on SCr of 4.96 mg/dL (H)). Liver Function Tests: Recent Labs  Lab 01/25/19 1955 01/26/19 0404 01/26/19 1827 01/27/19 0525  ALBUMIN 3.5 3.6 3.7 3.6   No results for input(s): LIPASE, AMYLASE in the last 168 hours. No results for input(s): AMMONIA in the last 168 hours. Coagulation Profile: No results for input(s): INR, PROTIME in the last 168 hours. Cardiac Enzymes: No results for input(s): CKTOTAL, CKMB, CKMBINDEX, TROPONINI in the last 168 hours. BNP (last 3 results) No results for input(s): PROBNP in the last 8760 hours. HbA1C: No results for input(s): HGBA1C in the last 72 hours. CBG: Recent Labs  Lab 01/31/19 1002  GLUCAP 85   Lipid Profile: No results for input(s): CHOL, HDL, LDLCALC, TRIG, CHOLHDL, LDLDIRECT in the last 72 hours. Thyroid Function Tests: No results for input(s): TSH, T4TOTAL, FREET4, T3FREE, THYROIDAB in the last 72 hours. Anemia Panel: Recent Labs    01/30/19 0308  FERRITIN 724*  TIBC 305  IRON 41*   Sepsis Labs: No results for input(s): PROCALCITON, LATICACIDVEN in the last 168 hours.  No results found for this or any previous visit (from the past 240 hour(s)).     Radiology Studies: No results found.      Scheduled Meds: . chlorhexidine gluconate (MEDLINE KIT)  15 mL Mouth Rinse BID  . Chlorhexidine Gluconate Cloth  6 each Topical Q0600  . Chlorhexidine Gluconate Cloth  6 each Topical Q0600  . [START ON 02/02/2019] darbepoetin (ARANESP) injection - DIALYSIS  200 mcg Intravenous Q Tue-HD  . feeding supplement (ENSURE ENLIVE)  237 mL Oral BID BM  . feeding supplement (PRO-STAT SUGAR FREE 64)  30 mL Oral Daily  . hydrocortisone sod succinate (SOLU-CORTEF) inj  50 mg Intravenous Daily  . mouth rinse  15 mL Mouth Rinse q12n4p  . midodrine  10  mg Oral TID WC  . multivitamin  1 tablet Oral QHS  . nystatin   Topical TID  . pantoprazole (PROTONIX) IV  40 mg Intravenous Q12H  . polyethylene  glycol  17 g Oral Daily   Continuous Infusions: . ferumoxytol       LOS: 18 days     Cordelia Poche, MD [facility] 02/01/2019, 12:17 PM  If 7PM-7AM, please contact night-coverage [email or web address]

## 2019-02-01 NOTE — Progress Notes (Signed)
Subjective: Interval History: weak, understands he may not get better and needs PC and perm access..  Objective: Vital signs in last 24 hours: Temp:  [97.9 F (36.6 C)-98.9 F (37.2 C)] 97.9 F (36.6 C) (06/15 0849) Pulse Rate:  [72-88] 88 (06/15 0849) Resp:  [16-22] 18 (06/15 0849) BP: (82-104)/(47-62) 92/54 (06/15 0849) SpO2:  [93 %-99 %] 97 % (06/15 0849) Weight:  [110.5 kg] 110.5 kg (06/15 0424) Weight change: -0.3 kg  Intake/Output from previous day: 06/14 0701 - 06/15 0700 In: 640 [P.O.:600; I.V.:40] Out: 0  Intake/Output this shift: Total I/O In: 300 [P.O.:300] Out: -   General appearance: alert, cooperative, no distress, mildly obese and pale Neck: IJ cath Resp: diminished breath sounds bilaterally and rales bibasilar Cardio: S1, S2 normal and systolic murmur: systolic ejection 2/6, crescendo and decrescendo at 2nd left intercostal space GI: obes, pos bs, soft, liver down 5 cm Extremities: edema 2+  Lab Results: Recent Labs    01/30/19 0308 01/30/19 1203 01/31/19 0509  WBC 7.6  --  5.5  HGB 7.4* 7.8* 7.5*  HCT 23.5* 23.0* 23.5*  PLT 95*  --  103*   BMET:  Recent Labs    01/31/19 0509 02/01/19 0323  NA 135 135  K 4.5 4.5  CL 98 98  CO2 25 26  GLUCOSE 95 100*  BUN 38* 60*  CREATININE 3.63* 4.96*  CALCIUM 8.9 8.9   No results for input(s): PTH in the last 72 hours. Iron Studies:  Recent Labs    01/30/19 0308  IRON 41*  TIBC 305  FERRITIN 724*    Studies/Results: Dg Bone Survey Met  Result Date: 01/30/2019 CLINICAL DATA:  Renal failure, myeloma EXAM: METASTATIC BONE SURVEY COMPARISON:  Chest radiograph 01/26/2019 FINDINGS: Diffuse osseous demineralization. Extensive atherosclerotic calcifications at the carotid bifurcations, aorta, and lower extremities BILATERAL jugular lines. Enlargement of cardiac silhouette with vascular congestion. RIGHT pleural effusion and suspect minimal pulmonary edema. 5 mm lucency LEFT parietal calvarium. 10 mm  lucency mid RIGHT humeral diaphysis. Questionable lucencies at the proximal humeri bilaterally. Healing fracture of the lateral RIGHT fifth rib. Questionable small lucency at mid RIGHT femoral diaphysis. No additional destructive bone lesions. Multilevel degenerative disc disease changes of the cervical, thoracic and lumbar spine. Degenerative changes of BILATERAL AC joints. IMPRESSION: Lucencies in the LEFT parietal calvarium and in the RIGHT humeral diaphysis with additional questionable lucencies of the proximal humeri and RIGHT femoral diaphysis, cannot exclude lesions from multiple myeloma. Electronically Signed   By: Lavonia Dana M.D.   On: 01/30/2019 13:54    I have reviewed the patient's current medications.  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  Will plan access, PC.  Will do HD tomorrow.some vol xs 2 MM per Onc 3 low bps give mido, 4 Debill 5 GIB on PPI 6 HPTH check PTh 7 Anemia add esa, Fe P esa, Fe, PPI, HD, access.   LOS: 18 days   Swan Zayed 02/01/2019,11:10 AM

## 2019-02-01 NOTE — Progress Notes (Signed)
Occupational Therapy Treatment Patient Details Name: Scott Mcdowell MRN: [id number] DOB: March 21, 1941 Today's Date: 02/01/2019    History of present illness 78 yo male admitted with ARF, scrotal cellulitis, Pna. Pt extubated 01/22/19.  Hx of CVA with R hemiparesis   OT comments  Pt progressing toward established goals. Pt continues to demonstrate great motivation throughout the session and participates in problem solving strategies to maximize his participation during session. Pt currently requires modA+2 for stand-pivot transfer from EOB to recliner to simulate toilet transfer. Pt requires setup A for grooming and eating while seated. Continued to educate pt on general UE exercises with BUE. Continued to educated pt on importance of elevation of RUE to assist with edema management. Pt will continue to benefit from skilled OT services to maximize safety and independence with ADL/IADL and functional mobility. Will continue to follow acutely and progress as tolerated.     Follow Up Recommendations  CIR    Equipment Recommendations  None recommended by OT    Recommendations for Other Services      Precautions / Restrictions Precautions Precautions: Fall Precaution Comments: R hemiparesis, h/o 1 fall in past 1 year Restrictions Weight Bearing Restrictions: No       Mobility Bed Mobility Overal bed mobility: Needs Assistance Bed Mobility: Supine to Sit     Supine to sit: Mod assist     General bed mobility comments: modA to progress trunk upright and progress RLE off bed  Transfers Overall transfer level: Needs assistance Equipment used: 2 person hand held assist Transfers: Sit to/from [facility] Sit to Stand: Mod assist;+2 physical assistance Stand pivot transfers: Mod assist;+2 physical assistance       General transfer comment: transferred pt with NT from bed to recliner with modA+2;assist to rise, stabilize and block R knee;transferred pt to the  left    Balance Overall balance assessment: Needs assistance Sitting-balance support: No upper extremity supported;Feet supported Sitting balance-Leahy Scale: Fair     Standing balance support: Bilateral upper extremity supported Standing balance-Leahy Scale: Poor Standing balance comment: pt required modA+2 to maintain upright posture                           ADL either performed or assessed with clinical judgement   ADL Overall ADL's : Needs assistance/impaired     Grooming: Set up;Sitting   Upper Body Bathing: Minimal assistance;Sitting Upper Body Bathing Details (indicate cue type and reason): minA to wash LUE;limited functional use of RUE             Toilet Transfer: Moderate assistance;+2 for physical assistance;Stand-pivot;Squat-pivot Toilet Transfer Details (indicate cue type and reason): simulated from EOB to recliner         Functional mobility during ADLs: Moderate assistance;+2 for physical assistance       Vision       Perception     Praxis      Cognition Arousal/Alertness: Awake/alert Behavior During Therapy: WFL for tasks assessed/performed Overall Cognitive Status: Within Functional Limits for tasks assessed                                          Exercises Exercises: General Upper Extremity General Exercises - Upper Extremity Elbow Flexion: AROM;AAROM;Both;10 reps Elbow Extension: AROM;AAROM;Both;10 reps Wrist Flexion: AROM;AAROM;Both;10 reps Wrist Extension: AROM;AAROM;Both;10 reps Digit Composite Flexion: AROM;Right;10 reps Composite Extension: AROM;10  reps;Right   Shoulder Instructions       General Comments educated pt on continuing to move RUE with support of LUE;continued to reinforce RUE elevation    Pertinent Vitals/ Pain       Pain Assessment: No/denies pain  Home Living                                          Prior Functioning/Environment              Frequency   Min 2X/week        Progress Toward Goals  OT Goals(current goals can now be found in the care plan section)  Progress towards OT goals: Progressing toward goals  Acute Rehab OT Goals Patient Stated Goal: to regain PLOF OT Goal Formulation: With patient Time For Goal Achievement: 02/10/19 Potential to Achieve Goals: Good ADL Goals Pt Will Perform Grooming: with min assist;sitting Pt Will Transfer to Toilet: with min assist;stand pivot transfer;bedside commode Pt Will Perform Toileting - Clothing Manipulation and hygiene: with min guard assist;sit to/from stand Additional ADL Goal #1: pt will stand and stabilize with AD with min A for adls x 2 minutes Additional ADL Goal #2: pt will incorporate RUE as assist without cues at least one time per session Additional ADL Goal #3: pt will be independent with RUE strengthening program  Plan Discharge plan remains appropriate    Co-evaluation                 AM-PAC OT "6 Clicks" Daily Activity     Outcome Measure   Help from another person eating meals?: A Little Help from another person taking care of personal grooming?: A Lot Help from another person toileting, which includes using toliet, bedpan, or urinal?: A Lot Help from another person bathing (including washing, rinsing, drying)?: A Lot Help from another person to put on and taking off regular upper body clothing?: A Lot Help from another person to put on and taking off regular lower body clothing?: Total 6 Click Score: 12    End of Session Equipment Utilized During Treatment: Gait belt;Rolling walker  OT Visit Diagnosis: Unsteadiness on feet (R26.81);Muscle weakness (generalized) (M62.81);Other abnormalities of gait and mobility (R26.89);History of falling (Z91.81);Repeated falls (R29.6)   Activity Tolerance Patient tolerated treatment well   Patient Left in chair;with call bell/phone within reach;with chair alarm set   Nurse Communication Mobility status         Time: 0347-4259 OT Time Calculation (min): 29 min  Charges: OT General Charges $OT Visit: 1 Visit OT Treatments $Self Care/Home Management : 23-37 mins  [place] Office: [place] 02/01/2019, 4:14 PM

## 2019-02-01 NOTE — Plan of Care (Signed)
°  Problem: Health Behavior/Discharge Planning: °Goal: Ability to manage health-related needs will improve °Outcome: Progressing °  °Problem: Skin Integrity: °Goal: Risk for impaired skin integrity will decrease °Outcome: Progressing °  °

## 2019-02-01 NOTE — Progress Notes (Signed)
I saw [name] this morning.  He had a upper endoscopy over the weekend.  There is no bleeding.  There is some friability of his gastric mucosa.  He is in need of hemodialysis.  He will need chronic dialysis.  He has light chain myeloma.  He did have a bone survey done on Saturday which did show lytic lesions in his bones.  He just does not have a good overall performance status.  He really has a performance status probably of ECOG 2-3.  He has a thrombus in his right arm.  He is not a good candidate for anticoagulation right now.  He has a light chain myeloma.  I think the best that we can do for him right now is just given high-dose Decadron for 4 days.  I am not sure that he is a really good candidate for chemotherapy.  Given his renal failure, we really cannot use Revlimid or pomalidomide.  I probably would consider treating him with Velcade/Cytoxan as frontline therapy for his light chain myeloma.  I know some centers are utilizing monoclonal antibody therapy with daratumumab.  I would be worried about using this as if he had a reaction to it, sure he would do all that well and would end up back in the hospital.  He is also iron deficient.  We did check iron studies on him and his iron studies do show iron deficiency.  I am sure that his erythropoietin level is on the low side.  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  He has motivated to try some kind of therapy.  Again, I would have him on high-dose Decadron for right now.  I think he will do okay with this.  My only concern with Decadron is his gastric friability and gastric irritability.  I need to make sure that he is on good antacid therapy with either a PPI or Pepcid.  This is incredibly complicated.  Again, he has a marginal performance status at best.  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  I am just not  sure he is going to be too tolerant of this right now given the fluid volume necessary for IVIG.  I know that there is a subcutaneous version hospital does not carry for inpatient use.  Lattie Haw, MD  Philippians 4:6

## 2019-02-02 ENCOUNTER — Inpatient Hospital Stay (HOSPITAL_COMMUNITY): Payer: Medicare Other

## 2019-02-02 ENCOUNTER — Encounter (HOSPITAL_COMMUNITY): Payer: Self-pay | Admitting: Nephrology

## 2019-02-02 DIAGNOSIS — N186 End stage renal disease: Secondary | ICD-10-CM

## 2019-02-02 DIAGNOSIS — Z01810 Encounter for preprocedural cardiovascular examination: Secondary | ICD-10-CM

## 2019-02-02 DIAGNOSIS — Z992 Dependence on renal dialysis: Secondary | ICD-10-CM

## 2019-02-02 LAB — RENAL FUNCTION PANEL
Albumin: 3.6 g/dL (ref 3.5–5.0)
Anion gap: 14 (ref 5–15)
BUN: 80 mg/dL — ABNORMAL HIGH (ref 8–23)
CO2: 24 mmol/L (ref 22–32)
Calcium: 9.1 mg/dL (ref 8.9–10.3)
Chloride: 98 mmol/L (ref 98–111)
Creatinine, Ser: 6.01 mg/dL — ABNORMAL HIGH (ref 0.61–1.24)
GFR calc Af Amer: 9 mL/min — ABNORMAL LOW (ref >60)
GFR calc non Af Amer: 8 mL/min — ABNORMAL LOW (ref >60)
Glucose, Bld: 109 mg/dL — ABNORMAL HIGH (ref 70–99)
Phosphorus: 5.4 mg/dL — ABNORMAL HIGH (ref 2.5–4.6)
Potassium: 4.9 mmol/L (ref 3.5–5.1)
Sodium: 136 mmol/L (ref 135–145)

## 2019-02-02 LAB — CBC
HCT: 23.5 % — ABNORMAL LOW (ref 39.0–52.0)
Hemoglobin: 7.4 g/dL — ABNORMAL LOW (ref 13.0–17.0)
MCH: 32.2 pg (ref 26.0–34.0)
MCHC: 31.5 g/dL (ref 30.0–36.0)
MCV: 102.2 fL — ABNORMAL HIGH (ref 80.0–100.0)
Platelets: 117 10*3/uL — ABNORMAL LOW (ref 150–400)
RBC: 2.3 MIL/uL — ABNORMAL LOW (ref 4.22–5.81)
RDW: 17.8 % — ABNORMAL HIGH (ref 11.5–15.5)
WBC: 6 10*3/uL (ref 4.0–10.5)
nRBC: 0 % (ref 0.0–0.2)

## 2019-02-02 MED ORDER — DARBEPOETIN ALFA 200 MCG/0.4ML IJ SOSY
PREFILLED_SYRINGE | INTRAMUSCULAR | Status: AC
  Administered 2019-02-02: 200 ug via INTRAVENOUS
  Filled 2019-02-02: qty 0.4

## 2019-02-02 MED ORDER — HEPARIN SODIUM (PORCINE) 1000 UNIT/ML IJ SOLN
INTRAMUSCULAR | Status: AC
  Administered 2019-02-02: 1000 [IU]
  Filled 2019-02-02: qty 4

## 2019-02-02 NOTE — Progress Notes (Signed)
Nutrition Follow-up  DOCUMENTATION CODES:   Not applicable  INTERVENTION:   D/C Prostat   Continue Ensure Enlive po BID, each supplement provides 350 kcal and 20 grams of protein  Continue Renal MVI  NUTRITION DIAGNOSIS:   Increased nutrient needs related to acute illness, other (see comment)(CRRT) as evidenced by estimated needs.  Ongoing  GOAL:   Patient will meet greater than or equal to 90% of their needs  Progressing  MONITOR:   PO intake, Supplement acceptance, Labs, Weight trends, I & O's  REASON FOR ASSESSMENT:   Ventilator    ASSESSMENT:   78 year old male who presented with worsening edema. He has a significant past medical history of CVA. Patient reported dyspnea for several weeks, no fever, chest pain or palpitations. He has LLE and scrotal edema. He was admitted for RLL PNA complicated by parapneumonic pleural effusion and AKI.    6/5- extubated  Pt previously on CRRT via temp right IJ. Plan for [facility] exchange and future permanent access soon.   HD today. Net UF documented as 991 ml.   Spoke with pt via phone. States he ate peach cobbler with ice cream for his birthday. Tolerated an entire salad for lunch. Meal completions charted 75-100% for his last four meals. Discussed the importance of protein intake for preservation of lean body mass and increased need for HD. Pt drinking Ensure BID and would like to continue with these. RD to provide new HD education in person on Friday.   EDW unknown. Admission weight documented as 104.3 kg and today's weight as 109.5 kg.   I/O: -449 ml since 6/2  UOP: 90 ml x 24 hrs   Medications: aranesp, decadron, midodrine, rena-vit, miralax Labs: Phosphorus 5.4 CBG 85-124  Diet Order:   Diet Order            Diet renal with fluid restriction Fluid restriction: 1200 mL Fluid; Room service appropriate? Yes; Fluid consistency: Thin  Diet effective now              EDUCATION NEEDS:   Not appropriate for education  at this time  Skin:  Skin Assessment: Skin Integrity Issues: Skin Integrity Issues:: Stage I, Stage II Stage I: sacrum, L heel, R heel Stage II: buttocks  Last BM:  6/15  Height:   Ht Readings from Last 1 Encounters:  01/30/19 6\' 2"  (1.88 m)    Weight:   Wt Readings from Last 1 Encounters:  02/02/19 109.5 kg    Ideal Body Weight:  86.4 kg  BMI:  Body mass index is 30.99 kg/m.  Estimated Nutritional Needs:   Kcal:  2400-2600 kcal  Protein:  125-140 grams  Fluid:  1000 ml + UOP   Mariana Single RD, LDN Clinical Nutrition Pager # [phone]

## 2019-02-02 NOTE — Procedures (Signed)
I was present at this session.  I have reviewed the session itself and made appropriate changes.  HD via temp cath.  bp 90-110  Mauricia Area 6/16/20209:53 AM

## 2019-02-02 NOTE — Plan of Care (Signed)
°  Problem: Health Behavior/Discharge Planning: °Goal: Ability to manage health-related needs will improve °Outcome: Progressing °  °Problem: Skin Integrity: °Goal: Risk for impaired skin integrity will decrease °Outcome: Progressing °  °

## 2019-02-02 NOTE — Progress Notes (Signed)
PROGRESS NOTE    Scott Mcdowell  SPQ:[id number] DOB: [birth year] DOA: 01/14/2019 PCP: [name], MD   Brief Narrative: Scott Mcdowell is a 78 y.o. male presenting with edema. Patient admitted with pneumonia and his hospital course has been complicated by AKI requiring renal replacement therapy, circulatory shock requiring vasopressors, respiratory failure requiring brief intubation, acute blood loss anemia from bleeding upper GI sources requiring EGD intervention and right upper extremity DVT requiring anticoagulation prior to being now complicated by upper GI bleeding.    Assessment & Plan:   Principal Problem:   Shock ([place]) Active Problems:   ARF (acute renal failure) (HCC)   Hyperkalemia   B12 deficiency anemia   Pleural effusion on right   Ascites   CAP (community acquired pneumonia)   Elevated troponin   Acute blood loss anemia   Upper GI bleed   Goals of care, counseling/discussion   Lambda light chain myeloma (HCC)   Pressure injury of skin   ESRD Hyperkalemia Initially managed with diuresis with poor effect. While in ICU, he was started on CRRT. Deemed ESRD. 90 mL of urine output over last 24 hours. -Nephrology recommendations: HD -Strict urine output  Hypotension/shock Patient required the use of vasopressors while in the ICU. Concern for relative adrenal insufficiency in the ICU and has been started on hydrocortisone in addition to midodrine. Blood pressure still soft and acutely worsened in setting of worsening anemia and recurrent melena -Discontinue hydrocortisone -Continue midodrine -Vitals q6 hours  Anemia Thrombocytopenia Acute blood loss anemia Upper GI bleeding Hemoglobin stable. Recent EGD performed on 6/4 and significant for multiple areas of bleeding which were treated with clip and epi. Iron studies normal except a mildly elevated ferritin. GI signed off and recommending to restart anticoagulation as indicated. Unfortunately,  evidence he is possibly re-bleeding. To date, he has had 8 units of PRBC, 1 units of platelets and 3 units of FFP. Repeat EGD (6/14) without source of active bleeding. Hemoglobin stable. -Daily CBC  Right upper extremity DVT Treatment complicated by anemia requiring multiple transfusions, in addition to epistaxis and hemoptysis. Hemoglobin stable currently. -Will likely need to start Coumadin in light of renal disease, however, still high risk for bleeding. Hematology recommending Eliquis  Pneumonia Associated right para-pneumonia effusion. Completed antibiotic course. Also underwent thoracentesis. 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  History of CVA Associated right sided deficits. Per patient, he was able to ambulate pretty well prior to current illness. PT/OT recommending CIR. CIR recommends consideration when patient is closer to discharge/oncology recs. Continues to work with PT.  Pressure injuries Sacrum, left heel, right heel, POA  Cirrhosis Seen on CT. Will need outpatient follow-up.  Obesity Body mass index is 30.99 kg/m.   DVT prophylaxis: SCDs Code Status:   Code Status: Full Code Family Communication: None Disposition Plan: Possible CIR if able vs SNF pending management of kidney failure   Consultants:   PCCM  Nephrology  [facility]  Procedures:   6/4: EGD Impression:               - Normal esophagus.                           - Adherent clot in the distal gastric body, unclear  if NG tube trauma but treated with hemostasis clip                            and epi. Friable mucosa in the proximal stomach                            with contact oozing.                           - Red blood with active bleeding and clots in the                             duodenal bulb. 4 discrete areas of adherent clot                            with extremely friable tissue but no focal                            ulceration appreciated. One of these areas had a                            visible vessel which was bleeding, another lesion                            had active oozing. 2 areas treated as outlined                            above. 4 clips placed total in the duodenal bulb.                            Patient with lytic lesions noted on xray, unclear                            if he has underlying myeloma? These lesions appear                            to have high risk for relbeeding in the setting of                            coagulopathy. No active bleeding noted following                            endoscopic intervention.  Recommendation:           - Patient to remain in the ICU for ongoing care.                           - NPO.                           - Continue present medications including IV PPI                           - Serial  Hgb                           - Further workup pending course. If he survives                            these acute issues, may need repeat EGD to                            re-evaluate duodenal bulb following treatment with                            PPI and workup underlying liver disease   6/14: EGD Impression:               - Normal appearing, widely patent esophagus and GEJ.                           - Moderate patchy gastritis.                           - Small hiatal hernia.                           - Erythematous duodenopathy.                           - No specimens collected. Recommendation:           - Full liquid diet today.                           - Continue present medications.                           - Serial CBC's.  Antimicrobials:  Azithro 5/29 >> 5/31   Vanco 5/29 >> 5/30  Cefepime 5/30 >> 6/4    Subjective: No problems today. Today is his birthday.  Objective:  Vitals:   02/02/19 1015 02/02/19 1033 02/02/19 1036 02/02/19 1122  BP: (!) 98/47 (!) 90/50 (!) 92/50 (!) 91/51  Pulse: 81 77 79 84  Resp:   18 18  Temp:   98.4 F (36.9 C) 97.9 F (36.6 C)  TempSrc:   Oral Oral  SpO2:   96% 96%  Weight:   109.5 kg   Height:        Intake/Output Summary (Last 24 hours) at 02/02/2019 1246 Last data filed at 02/02/2019 1100 Gross per 24 hour  Intake 1580.78 ml  Output 1081 ml  Net 499.78 ml   Filed Weights   02/01/19 0424 02/02/19 0722 02/02/19 1036  Weight: 110.5 kg 110.3 kg 109.5 kg    Examination:  General exam: Appears calm and comfortable Respiratory system: Clear to auscultation. Respiratory effort normal. Cardiovascular system: S1 & S2 heard, RRR. No murmurs, rubs, gallops or clicks. Gastrointestinal system: Abdomen is nondistended, soft and nontender. No organomegaly or masses felt. Normal bowel sounds heard. Central nervous system: Alert and oriented. No focal neurological deficits. Extremities: Significant right arm edema. No calf tenderness Skin: No cyanosis. No rashes Psychiatry: Judgement and insight appear normal. Mood & affect appropriate.   Data Reviewed: I have  personally reviewed following labs and imaging studies  CBC: Recent Labs  Lab 01/27/19 1059 01/29/19 0336 01/30/19 0308 01/30/19 1203 01/31/19 0509 02/02/19 0405  WBC 8.2 7.1 7.6  --  5.5 6.0  HGB 7.3* 6.8* 7.4* 7.8* 7.5* 7.4*  HCT 22.9* 21.6* 23.5* 23.0* 23.5* 23.5*  MCV 103.6* 102.4* 102.2*  --  101.3* 102.2*  PLT 94* 93* 95*  --  103* 253*   Basic Metabolic Panel: Recent Labs  Lab 01/26/19 1827 01/27/19 0524 01/27/19 0525  01/29/19 0336 01/30/19 0308 01/30/19 1203 01/31/19 0509 02/01/19 0323 02/02/19 0405  NA 135  --  136   < > 136 136 135 135 135 136  K 5.0  --  4.8   < > 4.0 4.7 4.2 4.5 4.5 4.9  CL 103  --  103   < > 99 99  --  98 98 98  CO2 24  --  25   < > 26 25  --  _0 GLUCOSE 117*  --  98   < > 117* 124* 92 95 100* 109*  BUN  26*  --  27*   < > 38* 62*  --  38* 60* 80*  CREATININE 2.30*  --  2.15*   < > 3.00* 4.55*  --  3.63* 4.96* 6.01*  CALCIUM 9.3  --  9.5   < > 8.8* 9.0  --  8.9 8.9 9.1  MG  --  2.7*  --   --   --   --   --   --   --   --   PHOS 3.4  --  3.1  --   --   --   --   --   --  5.4*   < > = values in this interval not displayed.   GFR: Estimated Creatinine Clearance: 12.9 mL/min (A) (by C-G formula based on SCr of 6.01 mg/dL (H)). Liver Function Tests: Recent Labs  Lab 01/26/19 1827 01/27/19 0525 02/02/19 0405  ALBUMIN 3.7 3.6 3.6   No results for input(s): LIPASE, AMYLASE in the last 168 hours. No results for input(s): AMMONIA in the last 168 hours. Coagulation Profile: No results for input(s): INR, PROTIME in the last 168 hours. Cardiac Enzymes: No results for input(s): CKTOTAL, CKMB, CKMBINDEX, TROPONINI in the last 168 hours. BNP (last 3 results) No results for input(s): PROBNP in the last 8760 hours. HbA1C: No results for input(s): HGBA1C in the last 72 hours. CBG: Recent Labs  Lab 01/31/19 1002  GLUCAP 85   Lipid Profile: No results for input(s): CHOL, HDL, LDLCALC, TRIG, CHOLHDL, LDLDIRECT in the last 72 hours. Thyroid Function Tests: No results for input(s): TSH, T4TOTAL, FREET4, T3FREE, THYROIDAB in the last 72 hours. Anemia Panel: No results for input(s): VITAMINB12, FOLATE, FERRITIN, TIBC, IRON, RETICCTPCT in the last 72 hours. Sepsis Labs: No results for input(s): PROCALCITON, LATICACIDVEN in the last 168 hours.  No results found for this or any previous visit (from the past 240 hour(s)).     Radiology Studies: No results found.      Scheduled Meds: . chlorhexidine gluconate (MEDLINE KIT)  15 mL Mouth Rinse BID  . Chlorhexidine Gluconate Cloth  6 each Topical Q0600  . Chlorhexidine Gluconate Cloth  6 each Topical Q0600  . darbepoetin (ARANESP) injection - DIALYSIS  200 mcg Intravenous Q Tue-HD  . dexamethasone  20 mg Intravenous Q24H  . feeding  supplement (ENSURE ENLIVE)  237 mL Oral BID BM  .  feeding supplement (PRO-STAT SUGAR FREE 64)  30 mL Oral Daily  . hydrocortisone sod succinate (SOLU-CORTEF) inj  50 mg Intravenous Daily  . mouth rinse  15 mL Mouth Rinse q12n4p  . midodrine  10 mg Oral TID WC  . multivitamin  1 tablet Oral QHS  . nystatin   Topical TID  . pantoprazole (PROTONIX) IV  40 mg Intravenous Q12H  . polyethylene glycol  17 g Oral Daily   Continuous Infusions: . sodium chloride 10 mL/hr at 02/02/19 0341     LOS: 19 days     Cordelia Poche, MD [facility] 02/02/2019, 12:46 PM  If 7PM-7AM, please contact night-coverage [email or web address]

## 2019-02-02 NOTE — Progress Notes (Signed)
PT Cancellation Note  Patient Details Name: KELSO BIBBY MRN: [id number] DOB: May 11, 1941   Cancelled Treatment:    Reason Eval/Treat Not Completed: Patient at procedure or test/unavailable;Fatigue/lethargy limiting ability to participate.  Pt in HD and then was waiting for his renal diet to be prepared, will try again as time and pt allow.   Ramond Dial 02/02/2019, 1:04 PM  Mee Hives, PT MS Acute Rehab Dept. Number: [address] and [place]

## 2019-02-02 NOTE — Progress Notes (Signed)
Subjective: Interval History: has no complaint. Birthday!!.  Objective: Vital signs in last 24 hours: Temp:  [97.7 F (36.5 C)-98.3 F (36.8 C)] 97.7 F (36.5 C) (06/16 0722) Pulse Rate:  [67-87] 78 (06/16 0915) Resp:  [17-19] 19 (06/16 0731) BP: (91-114)/(49-63) 91/49 (06/16 0915) SpO2:  [95 %-99 %] 95 % (06/16 0722) Weight:  [110.3 kg] 110.3 kg (06/16 0722) Weight change:   Intake/Output from previous day: 06/15 0701 - 06/16 0700 In: 1717.4 [P.O.:1600; I.V.:40.3; IV Piggyback:77.1] Out: 90 [Urine:90] Intake/Output this shift: Total I/O In: 50.4 [I.V.:50.4] Out: -   General appearance: alert, cooperative, no distress, mildly obese and pale Neck: IJ temp cath Resp: rhonchi bibasilar Cardio: S1, S2 normal and systolic murmur: systolic ejection 2/6, crescendo and decrescendo at 2nd left intercostal space GI: soft, non-tender; bowel sounds normal; no masses,  no organomegaly Extremities: edema 2-3+  Lab Results: Recent Labs    01/31/19 0509 02/02/19 0405  WBC 5.5 6.0  HGB 7.5* 7.4*  HCT 23.5* 23.5*  PLT 103* 117*   BMET:  Recent Labs    02/01/19 0323 02/02/19 0405  NA 135 136  K 4.5 4.9  CL 98 98  CO2 26 24  GLUCOSE 100* 109*  BUN 60* 80*  CREATININE 4.96* 6.01*  CALCIUM 8.9 9.1   No results for input(s): PTH in the last 72 hours. Iron Studies: No results for input(s): IRON, TIBC, TRANSFERRIN, FERRITIN in the last 72 hours.  Studies/Results: No results found.  I have reviewed the patient's current medications.  Assessment/Plan: 1 ESRD  HD today. VVS to see regarding perm access, and PC 2 MM per Onc 3 Anemia on esa 4 HPTH vit D P HD,esa, perm access, [place]   LOS: 19 days   Kanoelani Dobies 02/02/2019,9:50 AM

## 2019-02-02 NOTE — Progress Notes (Signed)
Scott Mcdowell is about the same.  He clearly is in need of hemodialysis.  Hopefully he will start this today.  I will start him on high-dose Decadron for the myeloma.  I think that this is probably the only treatment that he is going to tolerate right now and probably only treatment that we can actually do in the hospital.  He still is quite debilitated.  His overall performance status is ECOG 2-3 at best.  I am sure he is trying to do physical therapy and Occupational Therapy.  I am not sure how much he really can do right now.  His labs show a BUN of 80 creatinine 6.  His hemoglobin is 7.4.  His platelet count is 117,000.  He said his right arm got less swollen last night.  This morning, still looks quite swollen.  He does need anticoagulation for this.  I might consider Eliquis.  I think Coumadin would be incredibly difficult to manage safely.  I think Eliquis would be a whole lot easier to give him.  Eliquis can be used in renal failure.  I think that his diet is only liquids.  I think he probably should try a regular diet.  He really needs to have an improved nutritional state.  There does not appear to be any more GI blood loss.  Overall, his physical exam is pretty much unchanged.  His temperature is 98.  Pulse 81.  Blood pressure 104/58.  Again, I think the real problem is his overall poor level of performance.  I realize that he has a very treatable malignancy with respect to the lambda light chain myeloma.  I just do not think that he is capable of tolerating the kind of chemotherapy that we typically use.  Maybe, if he improves his performance status by increasing and improving his physical activity level, the may be more appropriate chemotherapy could be used.  I do very much appreciate all the care that he is getting from the staff up on [facility]M.  Scott Haw, MD  [place] 14:14

## 2019-02-02 NOTE — Progress Notes (Signed)
Renal Navigator spoke with Nephrologist/Dr. Deterding to discuss initiation of OP HD referral. Renal Navigator has been unable to get through (rings busy) to patient's room in order to complete assessment (Renal Navigator notes that today is patient's 78th birthday) and will attempt again at a later time, or attempt to call patient's wife/[name] listed as ER contact in Epic.   Scott Mcdowell, [place] Renal Navigator [phone]

## 2019-02-02 NOTE — Progress Notes (Signed)
UE vein mapping       has been completed. Preliminary results can be found under CV proc through chart review. Langston [place], BS, RDMS, RVT   

## 2019-02-02 NOTE — Consult Note (Addendum)
[facility] [place] CONSULT NOTE VASCULAR SURGERY ASSESSMENT & PLAN:   END-STAGE RENAL DISEASE: We were asked to place a tunneled dialysis catheter and also consider permanent access.  He has a temporary catheter in his right IJ for dialysis and a triple-lumen catheter in his left IJ.  He has had a previous history of a stroke with weakness in the right upper extremity.  He had a DVT involving the right brachial vein and has been on anticoagulation.  He subsequently had a GI bleed.  His anticoagulation is currently on hold.  His upper endoscopy was negative.  I reviewed his vein map which shows that his upper arm cephalic vein on the left looks reasonable.  He is potentially candidate for a brachiocephalic fistula.  We will try to schedule him for placement of a tunneled dialysis catheter and a left AV fistula on Thursday or Friday.  It looks like he is on a Tuesday Thursday Saturday dialysis schedule.  Deitra Mayo, MD, [place] [phone] Office: [phone]    MRN : [id number]  Reason for Consult: AKI on CKD Referring Physician: Dr. Jimmy Footman  History of Present Illness: 44 male admitted with AKI with volume overload.  We have been consulted for [facility] and permanent access.    Past medical history includes: Hx of CVA with flaccid right UE, anemia, pleural effusions, pneumonia.  She was placed on Heparin for recent acute deep vein thrombosis involving the right brachial veins on 01/19/2019.  She developed developed severe upper GI bleed overnight. Hgb went from 4's to 6.8 after 3 units of blood.  She has been receiving CRRT.  Upper endoscopy revealed no active bleeding.   Anticoagulation is on hold.     Current Facility-Administered Medications  Medication Dose Route Frequency Provider Last Rate Last Dose  . 0.9 %  sodium chloride infusion   Intravenous PRN Mariel Aloe, MD 10 mL/hr at 02/02/19 0341    . chlorhexidine gluconate (MEDLINE KIT) (PERIDEX) 0.12 %  solution 15 mL  15 mL Mouth Rinse BID Chesley Mires, MD   15 mL at 02/01/19 2200  . Chlorhexidine Gluconate Cloth 2 % PADS 6 each  6 each Topical Q0600 Chesley Mires, MD   6 each at 02/02/19 [phone]  . Chlorhexidine Gluconate Cloth 2 % PADS 6 each  6 each Topical Q0600 Deterding, James, MD      . Darbepoetin Alfa (ARANESP) injection 200 mcg  200 mcg Intravenous Q Antonieta Pert, MD   200 mcg at 02/02/19 1009  . dexamethasone (DECADRON) injection 20 mg  20 mg Intravenous Q24H Ennever, Rudell Cobb, MD      . feeding supplement (ENSURE ENLIVE) (ENSURE ENLIVE) liquid 237 mL  237 mL Oral BID BM Chesley Mires, MD   237 mL at 02/01/19 2152  . feeding supplement (PRO-STAT SUGAR FREE 64) liquid 30 mL  30 mL Oral Daily Chesley Mires, MD   30 mL at 02/01/19 1245  . hydrocortisone sodium succinate (SOLU-CORTEF) 100 MG injection 50 mg  50 mg Intravenous Daily Mariel Aloe, MD   50 mg at 02/01/19 1026  . MEDLINE mouth rinse  15 mL Mouth Rinse q12n4p Chesley Mires, MD   15 mL at 02/01/19 1247  . midodrine (PROAMATINE) tablet 10 mg  10 mg Oral TID WC Chesley Mires, MD   10 mg at 02/02/19 0383  . multivitamin (RENA-VIT) tablet 1 tablet  1 tablet Oral Nile Riggs, MD   1 tablet at 02/01/19 2151  .  nystatin (MYCOSTATIN/NYSTOP) topical powder   Topical TID Chesley Mires, MD      . pantoprazole (PROTONIX) injection 40 mg  40 mg Intravenous Q12H Vena Rua, PA-C   40 mg at 02/01/19 2151  . polyethylene glycol (MIRALAX / GLYCOLAX) packet 17 g  17 g Oral Daily Chesley Mires, MD   17 g at 02/01/19 1027  . sodium chloride (OCEAN) 0.65 % nasal spray 1 spray  1 spray Each Nare PRN Chesley Mires, MD   1 spray at 01/20/19 0957  . sodium chloride flush (NS) 0.9 % injection 10-40 mL  10-40 mL Intracatheter PRN Chesley Mires, MD   30 mL at 01/30/19 2311    Pt meds include: Statin :Yes Betablocker: No ASA: Yes Other anticoagulants/antiplatelets: none  Past Medical History:  Diagnosis Date  . Goals of care,  counseling/discussion 01/29/2019  . HTN (hypertension)   . Lambda light chain myeloma ([place]) 01/29/2019  . Stroke [facility]) 2004   w right sided weakness    Past Surgical History:  Procedure Laterality Date  . ESOPHAGOGASTRODUODENOSCOPY N/A 01/21/2019   Procedure: ESOPHAGOGASTRODUODENOSCOPY (EGD);  Surgeon: Yetta Flock, MD;  Location: [facility] END[facility];  Service: Gastroenterology;  Laterality: N/A;  at bedside. PCCM intubating patient and he will be ready for EGD by 10: 30-10:40am  . ESOPHAGOGASTRODUODENOSCOPY (EGD) WITH PROPOFOL N/A 01/31/2019   Procedure: ESOPHAGOGASTRODUODENOSCOPY (EGD) WITH PROPOFOL;  Surgeon: Juanita Craver, MD;  [facility] END[facility]COPY;  Service: Endoscopy;  Laterality: N/A;  . HEMOSTASIS CLIP PLACEMENT  01/21/2019   Procedure: HEMOSTASIS CLIP PLACEMENT;  Surgeon: Yetta Flock, MD;  Location: [facility] ENDOSCOPY;  Service: Gastroenterology;;  . HEMOSTASIS CONTROL  01/21/2019   Procedure: HEMOSTASIS CONTROL;  Surgeon: Yetta Flock, MD;  Location: [facility] ENDOSCOPY;  Service: Gastroenterology;;  [place] Rm Probe  . IR FLUORO GUIDE CV LINE RIGHT  01/20/2019  . IR US GUIDE VASC ACCESS RIGHT  01/20/2019  . SCLEROTHERAPY  01/21/2019   Procedure: SCLEROTHERAPY;  Surgeon: Yetta Flock, MD;  Location: [facility] ENDOS[facility];  Service: Gastroenterology;;    Social History Social History   Tobacco Use  . Smoking status: Former Smoker    Types: Cigarettes  . Smokeless tobacco: Never Used  Substance Use Topics  . Alcohol use: Yes    Alcohol/week: 5.0 standard drinks    Types: 5 Cans of beer per week  . Drug use: Not on file    Family History Family History  Problem Relation Age of Onset  . Cancer Mother   . Parkinson's disease Father     No Known Allergies   REVIEW OF SYSTEMS  General: '[ ]'  Weight loss, '[ ]'  Fever, '[ ]'  chills Neurologic: '[ ]'  Dizziness, '[ ]'  Blackouts, '[ ]'  Seizure '[ ]'  Stroke, '[ ]'  "Mini stroke", '[ ]'  Slurred speech, '[ ]'  Temporary blindness; [x ] weakness in arms or  legs, '[ ]'  Hoarseness '[ ]'  Dysphagia Cardiac: '[ ]'  Chest pain/pressure, '[ ]'  Shortness of breath at rest '[ ]'  Shortness of breath with exertion, '[ ]'  Atrial fibrillation or irregular heartbeat  Vascular: '[ ]'  Pain in legs with walking, '[ ]'  Pain in legs at rest, '[ ]'  Pain in legs at night,  '[ ]'  Non-healing ulcer, [x ] Blood clot in vein/DVT,   Pulmonary: '[ ]'  Home oxygen, '[ ]'  Productive cough, '[ ]'  Coughing up blood, '[ ]'  Asthma,  '[ ]'  Wheezing '[ ]'  COPD Musculoskeletal:  '[ ]'  Arthritis, '[ ]'  Low back pain, '[ ]'  Joint pain Hematologic: [ x] Easy Bruising, [x ]  Anemia; '[ ]'  Hepatitis Gastrointestinal: '[ ]'  Blood in stool, '[ ]'  Gastroesophageal Reflux/heartburn, Urinary: '[ ]'  chronic Kidney disease, [ x] on HD - '[ ]'  MWF or [ x] TTHS, '[ ]'  Burning with urination, '[ ]'  Difficulty urinating Skin: '[ ]'  Rashes, '[ ]'  Wounds Psychological: '[ ]'  Anxiety, '[ ]'  Depression  Physical Examination Vitals:   02/02/19 0945 02/02/19 1015 02/02/19 1033 02/02/19 1036  BP: (!) 92/49 (!) 98/47 (!) (P) 90/50 (!) 92/50  Pulse: 78 81 (P) 77 79  Resp:    18  Temp:    98.4 F (36.9 C)  TempSrc:    Oral  SpO2:      Weight:      Height:       Body mass index is 31.22 kg/m.  General:  WDWN in NAD HENT: WNL Eyes: Pupils equal Pulmonary: normal non-labored breathing , without Rales, rhonchi,  wheezing Cardiac: RRR, without  Murmurs, rubs or gallops; No carotid bruits Abdomen: soft, NT, no masses Skin: no rashes, ulcers noted;  no Gangrene , no cellulitis; no open wounds;   Vascular Exam/Pulses:palpable left radial and brachial pulses, unable to palpate pulses on the right.  Right UE warm.   Musculoskeletal: no muscle wasting or atrophy on left; right UE edema flaccid  Neurologic: A&O X 3; Appropriate Affect ;  SENSATION: normal on left side of body MOTOR FUNCTION: 5/5 Symmetric Speech is fluent/normal   Significant Diagnostic Studies: CBC Lab Results  Component Value Date   WBC 6.0 02/02/2019   HGB 7.4 (L) 02/02/2019    HCT 23.5 (L) 02/02/2019   MCV 102.2 (H) 02/02/2019   PLT 117 (L) 02/02/2019    BMET    Component Value Date/Time   NA 136 02/02/2019 0405   K 4.9 02/02/2019 0405   CL 98 02/02/2019 0405   CO2 24 02/02/2019 0405   GLUCOSE 109 (H) 02/02/2019 0405   BUN 80 (H) 02/02/2019 0405   CREATININE 6.01 (H) 02/02/2019 0405   CALCIUM 9.1 02/02/2019 0405   GFRNONAA 8 (L) 02/02/2019 0405   GFRAA 9 (L) 02/02/2019 0405   Estimated Creatinine Clearance: 13 mL/min (A) (by C-G formula based on SCr of 6.01 mg/dL (H)).  COAG Lab Results  Component Value Date   INR 1.3 (H) 01/21/2019   INR 1.6 (H) 01/20/2019     Non-Invasive Vascular Imaging:  Pending vein mapping  ASSESSMENT/PLAN:  ESRD with temp right IJ catheter. He has a flaccid right arm with recent DVT in the brachial artery.  Anticoagulation haws been held secondary to GI bleed.  He has palpable pulses in the left UE and motor intact.  Pending vein mapping we will plan TDC exchange and future permanent access.    Roxy Horseman 02/02/2019 10:56 AM

## 2019-02-03 LAB — RENAL FUNCTION PANEL
Albumin: 3.5 g/dL (ref 3.5–5.0)
Albumin: 3.8 g/dL (ref 3.5–5.0)
Anion gap: 12 (ref 5–15)
Anion gap: 13 (ref 5–15)
BUN: 74 mg/dL — ABNORMAL HIGH (ref 8–23)
BUN: 80 mg/dL — ABNORMAL HIGH (ref 8–23)
CO2: 24 mmol/L (ref 22–32)
CO2: 24 mmol/L (ref 22–32)
Calcium: 8.8 mg/dL — ABNORMAL LOW (ref 8.9–10.3)
Calcium: 9 mg/dL (ref 8.9–10.3)
Chloride: 98 mmol/L (ref 98–111)
Chloride: 98 mmol/L (ref 98–111)
Creatinine, Ser: 5.48 mg/dL — ABNORMAL HIGH (ref 0.61–1.24)
Creatinine, Ser: 5.68 mg/dL — ABNORMAL HIGH (ref 0.61–1.24)
GFR calc Af Amer: 10 mL/min — ABNORMAL LOW (ref >60)
GFR calc Af Amer: 10 mL/min — ABNORMAL LOW (ref >60)
GFR calc non Af Amer: 9 mL/min — ABNORMAL LOW (ref >60)
GFR calc non Af Amer: 9 mL/min — ABNORMAL LOW (ref >60)
Glucose, Bld: 132 mg/dL — ABNORMAL HIGH (ref 70–99)
Glucose, Bld: 119 mg/dL — ABNORMAL HIGH (ref 70–99)
Phosphorus: 5.5 mg/dL — ABNORMAL HIGH (ref 2.5–4.6)
Phosphorus: 5.1 mg/dL — ABNORMAL HIGH (ref 2.5–4.6)
Potassium: 5.9 mmol/L — ABNORMAL HIGH (ref 3.5–5.1)
Potassium: 5 mmol/L (ref 3.5–5.1)
Sodium: 134 mmol/L — ABNORMAL LOW (ref 135–145)
Sodium: 135 mmol/L (ref 135–145)

## 2019-02-03 LAB — PTH, INTACT AND CALCIUM
Calcium, Total (PTH): 9.3 mg/dL (ref 8.6–10.2)
PTH: 40 pg/mL (ref 15–65)

## 2019-02-03 LAB — CBC
HCT: 23.5 % — ABNORMAL LOW (ref 39.0–52.0)
Hemoglobin: 7.4 g/dL — ABNORMAL LOW (ref 13.0–17.0)
MCH: 32.3 pg (ref 26.0–34.0)
MCHC: 31.5 g/dL (ref 30.0–36.0)
MCV: 102.6 fL — ABNORMAL HIGH (ref 80.0–100.0)
Platelets: 120 10*3/uL — ABNORMAL LOW (ref 150–400)
RBC: 2.29 MIL/uL — ABNORMAL LOW (ref 4.22–5.81)
RDW: 17.9 % — ABNORMAL HIGH (ref 11.5–15.5)
WBC: 5.1 10*3/uL (ref 4.0–10.5)
nRBC: 0 % (ref 0.0–0.2)

## 2019-02-03 LAB — HEPARIN LEVEL (UNFRACTIONATED): Heparin Unfractionated: 0.45 IU/mL (ref 0.30–0.70)

## 2019-02-03 MED ORDER — PENTAFLUOROPROP-TETRAFLUOROETH EX AERO: 1.0000 "application " | INHALATION_SPRAY | CUTANEOUS | Status: DC | PRN

## 2019-02-03 MED ORDER — ALTEPLASE 2 MG IJ SOLR: 2.0000 mg | Freq: Once | INTRAMUSCULAR | Status: DC | PRN

## 2019-02-03 MED ORDER — HEPARIN SODIUM (PORCINE) 1000 UNIT/ML DIALYSIS: 40.0000 [IU]/kg | Freq: Once | INTRAMUSCULAR | Status: DC

## 2019-02-03 MED ORDER — LIDOCAINE-PRILOCAINE 2.5-2.5 % EX CREA: 1.0000 "application " | TOPICAL_CREAM | CUTANEOUS | Status: DC | PRN

## 2019-02-03 MED ORDER — HEPARIN (PORCINE) 25000 UT/250ML-% IV SOLN
800.0000 [IU]/h | INTRAVENOUS | Status: DC
  Administered 2019-02-03: 1200 [IU]/h via INTRAVENOUS
  Administered 2019-02-05: 800 [IU]/h via INTRAVENOUS
  Filled 2019-02-03 (×3): qty 250

## 2019-02-03 MED ORDER — LIDOCAINE HCL (PF) 1 % IJ SOLN: 5.0000 mL | INTRAMUSCULAR | Status: DC | PRN

## 2019-02-03 MED ORDER — SODIUM CHLORIDE 0.9 % IV SOLN: 100.0000 mL | INTRAVENOUS | Status: DC | PRN

## 2019-02-03 MED ORDER — HEPARIN SODIUM (PORCINE) 1000 UNIT/ML DIALYSIS
1000.0000 [IU] | INTRAMUSCULAR | Status: DC | PRN
  Administered 2019-02-04: 2800 [IU] via INTRAVENOUS_CENTRAL

## 2019-02-03 MED ORDER — CHLORHEXIDINE GLUCONATE CLOTH 2 % EX PADS
6.0000 | MEDICATED_PAD | Freq: Every day | CUTANEOUS | Status: DC
  Administered 2019-02-06 – 2019-02-10 (×5): 6 via TOPICAL

## 2019-02-03 NOTE — Progress Notes (Signed)
Called and spoke with nurse, notified that [facility] needs order from nephrology to be able to draw labs from [facility]. VU. Fran Lowes, RN [facility]

## 2019-02-03 NOTE — Progress Notes (Signed)
Subjective: Interval History: has no complaint , just sleepy.  Objective: Vital signs in last 24 hours: Temp:  [97.8 F (36.6 C)-98.7 F (37.1 C)] 98 F (36.7 C) (06/17 0900) Pulse Rate:  [78-86] 78 (06/17 0900) Resp:  [16-18] 18 (06/17 0900) BP: (97-126)/(58-72) 122/71 (06/17 0900) SpO2:  [96 %-97 %] 96 % (06/17 0900) Weight change:   Intake/Output from previous day: 06/16 0701 - 06/17 0700 In: 1111.7 [P.O.:720; I.V.:278.7] Out: 991  Intake/Output this shift: Total I/O In: 240 [P.O.:240] Out: 0   General appearance: cooperative, no distress, pale and slowed mentation Neck: Temp cath Resp: clear to auscultation bilaterally Cardio: S1, S2 normal and systolic murmur: systolic ejection 2/6, crescendo and decrescendo at 2nd left intercostal space GI: soft, non-tender; bowel sounds normal; no masses,  no organomegaly Extremities: edema 2+  Lab Results: Recent Labs    02/02/19 0405 02/03/19 0434  WBC 6.0 5.1  HGB 7.4* 7.4*  HCT 23.5* 23.5*  PLT 117* 120*   BMET:  Recent Labs    02/02/19 0405 02/03/19 0434  NA 136 134*  K 4.9 5.9*  CL 98 98  CO2 24 24  GLUCOSE 109* 132*  BUN 80* 74*  CREATININE 6.01* 5.48*  CALCIUM 9.1 8.8*   No results for input(s): PTH in the last 72 hours. Iron Studies: No results for input(s): IRON, TIBC, TRANSFERRIN, FERRITIN in the last 72 hours.  Studies/Results: Vas [place] Upper Ext Vein Mapping (pre-op Avf)  Result Date: 02/02/2019 UPPER EXTREMITY VEIN MAPPING  Indications: Pre-access. Performing Technologist: June Leap RDMS, RVT  Examination Guidelines: A complete evaluation includes B-mode imaging, spectral Doppler, color Doppler, and power Doppler as needed of all accessible portions of each vessel. Bilateral testing is considered an integral part of a complete examination. Limited examinations for reoccurring indications may be performed as noted. +-----------------+-------------+----------+---------+ Right Cephalic   Diameter  (cm)Depth (cm)Findings  +-----------------+-------------+----------+---------+ Shoulder             0.38        0.75             +-----------------+-------------+----------+---------+ Mid upper arm        0.43        0.40             +-----------------+-------------+----------+---------+ Dist upper arm       0.48        0.68             +-----------------+-------------+----------+---------+ Antecubital fossa    0.53        0.84             +-----------------+-------------+----------+---------+ Prox forearm         0.39        0.36             +-----------------+-------------+----------+---------+ Mid forearm          0.37        0.47   branching +-----------------+-------------+----------+---------+ Dist forearm         0.35        0.27             +-----------------+-------------+----------+---------+ +-----------------+-------------+----------+---------+ Left Cephalic    Diameter (cm)Depth (cm)Findings  +-----------------+-------------+----------+---------+ Shoulder             0.42        0.71             +-----------------+-------------+----------+---------+ Mid upper arm        0.34  0.62             +-----------------+-------------+----------+---------+ Dist upper arm       0.42        0.40             +-----------------+-------------+----------+---------+ Antecubital fossa    0.37        0.34   branching +-----------------+-------------+----------+---------+ Prox forearm         0.31        0.21             +-----------------+-------------+----------+---------+ Mid forearm          0.32        0.18             +-----------------+-------------+----------+---------+ Dist forearm         0.34        0.20             +-----------------+-------------+----------+---------+ *See table(s) above for measurements and observations.  Diagnosing physician: Deitra Mayo MD Electronically signed by Deitra Mayo MD on 02/02/2019  at 3:41:59 PM.    Final     I have reviewed the patient's current medications.  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  Recheck K.  xs vol.  To get access on Fri 2 Anemia on esa 3 MM 4 HPTH 5 Hx GIB P Hd, esa, vit D, access , recheck k    LOS: 20 days   Jeneen Rinks Stacy Deshler 02/03/2019,11:52 AM

## 2019-02-03 NOTE — Progress Notes (Signed)
PROGRESS NOTE    Scott Mcdowell  AOZ:[id number] DOB: May 05, 1941 DOA: 01/14/2019 PCP: [name], MD   Brief Narrative: Scott Mcdowell is a 78 y.o. male presenting with edema. Patient admitted with pneumonia and his hospital course has been complicated by AKI requiring renal replacement therapy, circulatory shock requiring vasopressors, respiratory failure requiring brief intubation, acute blood loss anemia from bleeding upper GI sources requiring EGD intervention and right upper extremity DVT requiring anticoagulation prior to being now complicated by upper GI bleeding.    Assessment & Plan:   Principal Problem:   Shock ([place]) Active Problems:   ARF (acute renal failure) (HCC)   Hyperkalemia   B12 deficiency anemia   Pleural effusion on right   Ascites   CAP (community acquired pneumonia)   Elevated troponin   Acute blood loss anemia   Upper GI bleed   Goals of care, counseling/discussion   Lambda light chain myeloma (HCC)   Pressure injury of skin   ESRD Hyperkalemia Initially managed with diuresis with poor effect. While in ICU, he was started on CRRT. Deemed ESRD. 90 mL of urine output over last 24 hours. -Nephrology recommendations: HD -Strict urine output -Access planned for 6/19  Hypotension/shock Patient required the use of vasopressors while in the ICU. Concern for relative adrenal insufficiency in the ICU and has been started on hydrocortisone in addition to midodrine. Blood pressure still soft and acutely worsened in setting of worsening anemia and recurrent melena. Hydrocortisone discontinued. -Continue midodrine  Anemia Thrombocytopenia Acute blood loss anemia Upper GI bleeding Hemoglobin stable. Recent EGD performed on 6/4 and significant for multiple areas of bleeding which were treated with clip and epi. Iron studies normal except a mildly elevated ferritin. GI signed off and recommending to restart anticoagulation as indicated.  Unfortunately, evidence he is possibly re-bleeding. To date, he has had 8 units of PRBC, 1 units of platelets and 3 units of FFP. Repeat EGD (6/14) without source of active bleeding. Hemoglobin stable. -Daily CBC  Right upper extremity DVT Treatment complicated by anemia requiring multiple transfusions, in addition to epistaxis and hemoptysis. Hemoglobin stable currently. -Will likely need to start Coumadin in light of renal disease, however, still high risk for bleeding. Hematology recommending Eliquis -Start Heparin drip for now since he will be going for procedures  Pneumonia Associated right para-pneumonia effusion. Completed antibiotic course. Also underwent thoracentesis. 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  History of CVA Associated right sided deficits. Per patient, he was able to ambulate pretty well prior to current illness. PT/OT recommending CIR. CIR recommends consideration when patient is closer to discharge/oncology recs. Continues to work with PT.  Pressure injuries Sacrum, left heel, right heel, POA  Cirrhosis Seen on CT. Will need outpatient follow-up.  Obesity Body mass index is 30.99 kg/m.   DVT prophylaxis: SCDs, Heparin drip Code Status:   Code Status: Full Code Family Communication: None Disposition Plan: Possible CIR if able vs SNF pending management of kidney failure   Consultants:   PCCM  Nephrology  [facility]  Procedures:   6/4: EGD Impression:               - Normal esophagus.                           -  Adherent clot in the distal gastric body, unclear                            if NG tube trauma but treated with hemostasis clip                            and epi. Friable mucosa in the  proximal stomach                            with contact oozing.                           - Red blood with active bleeding and clots in the                            duodenal bulb. 4 discrete areas of adherent clot                            with extremely friable tissue but no focal                            ulceration appreciated. One of these areas had a                            visible vessel which was bleeding, another lesion                            had active oozing. 2 areas treated as outlined                            above. 4 clips placed total in the duodenal bulb.                            Patient with lytic lesions noted on xray, unclear                            if he has underlying myeloma? These lesions appear                            to have high risk for relbeeding in the setting of                            coagulopathy. No active bleeding noted following                            endoscopic intervention.  Recommendation:           - Patient to remain in the ICU for ongoing care.                           - NPO.                           -  Continue present medications including IV PPI                           - Serial Hgb                           - Further workup pending course. If he survives                            these acute issues, may need repeat EGD to                            re-evaluate duodenal bulb following treatment with                            PPI and workup underlying liver disease   6/14: EGD Impression:               - Normal appearing, widely patent esophagus and GEJ.                           - Moderate patchy gastritis.                           - Small hiatal hernia.                           - Erythematous duodenopathy.                           - No specimens collected. Recommendation:           - Full liquid diet today.                           - Continue present medications.                           - Serial CBC's.   Antimicrobials:  Azithro 5/29 >> 5/31   Vanco 5/29 >> 5/30  Cefepime 5/30 >> 6/4    Subjective: No concerns today  Objective: Vitals:   02/02/19 1755 02/02/19 1954 02/03/19 0549 02/03/19 0900  BP: (!) 97/58 (!) 101/59 126/72 122/71  Pulse: 86 79 85 78  Resp: _0 Temp: 98.3 F (36.8 C) 98.7 F (37.1 C) 97.8 F (36.6 C) 98 F (36.7 C)  TempSrc: Oral Oral Oral Oral  SpO2: 96% 97% 96% 96%  Weight:      Height:        Intake/Output Summary (Last 24 hours) at 02/03/2019 1231 Last data filed at 02/03/2019 1027 Gross per 24 hour  Intake 1188.33 ml  Output 0 ml  Net 1188.33 ml   Filed Weights   02/01/19 0424 02/02/19 0722 02/02/19 1036  Weight: 110.5 kg 110.3 kg 109.5 kg    Examination:  General exam: Appears calm and comfortable Respiratory system: Clear to auscultation. Respiratory effort normal. Cardiovascular system: S1 & S2 heard, RRR. 2/6 systolic murmur Gastrointestinal system: Abdomen is nondistended, soft and nontender. No organomegaly or masses felt. Normal bowel sounds heard. Central nervous system: Alert and oriented. No focal neurological  deficits. Extremities: Right arm edema No calf tenderness Skin: No cyanosis. No rashes Psychiatry: Judgement and insight appear normal. Fla affect   Data Reviewed: I have personally reviewed following labs and imaging studies  CBC: Recent Labs  Lab 01/29/19 0336 01/30/19 0308 01/30/19 1203 01/31/19 0509 02/02/19 0405 02/03/19 0434  WBC 7.1 7.6  --  5.5 6.0 5.1  HGB 6.8* 7.4* 7.8* 7.5* 7.4* 7.4*  HCT 21.6* 23.5* 23.0* 23.5* 23.5* 23.5*  MCV 102.4* 102.2*  --  101.3* 102.2* 102.6*  PLT 93* 95*  --  103* 117* 568*   Basic Metabolic Panel: Recent Labs  Lab 01/30/19 0308 01/30/19 1203 01/31/19 0509 02/01/19 0323 02/02/19 0405 02/03/19 0434  NA 136 135 135 135 136 134*  K 4.7 4.2 4.5 4.5 4.9 5.9*  CL 99  --  98 98 98 98  CO2 25  --  _0 GLUCOSE 124* 92 95 100* 109* 132*  BUN 62*  --   38* 60* 80* 74*  CREATININE 4.55*  --  3.63* 4.96* 6.01* 5.48*  CALCIUM 9.0  --  8.9 8.9 9.1 8.8*  PHOS  --   --   --   --  5.4* 5.5*   GFR: Estimated Creatinine Clearance: 14.2 mL/min (A) (by C-G formula based on SCr of 5.48 mg/dL (H)). Liver Function Tests: Recent Labs  Lab 02/02/19 0405 02/03/19 0434  ALBUMIN 3.6 3.5   No results for input(s): LIPASE, AMYLASE in the last 168 hours. No results for input(s): AMMONIA in the last 168 hours. Coagulation Profile: No results for input(s): INR, PROTIME in the last 168 hours. Cardiac Enzymes: No results for input(s): CKTOTAL, CKMB, CKMBINDEX, TROPONINI in the last 168 hours. BNP (last 3 results) No results for input(s): PROBNP in the last 8760 hours. HbA1C: No results for input(s): HGBA1C in the last 72 hours. CBG: Recent Labs  Lab 01/31/19 1002  GLUCAP 85   Lipid Profile: No results for input(s): CHOL, HDL, LDLCALC, TRIG, CHOLHDL, LDLDIRECT in the last 72 hours. Thyroid Function Tests: No results for input(s): TSH, T4TOTAL, FREET4, T3FREE, THYROIDAB in the last 72 hours. Anemia Panel: No results for input(s): VITAMINB12, FOLATE, FERRITIN, TIBC, IRON, RETICCTPCT in the last 72 hours. Sepsis Labs: No results for input(s): PROCALCITON, LATICACIDVEN in the last 168 hours.  No results found for this or any previous visit (from the past 240 hour(s)).     Radiology Studies: Vas [place] Upper Ext Vein Mapping (pre-op Avf)  Result Date: 02/02/2019 UPPER EXTREMITY VEIN MAPPING  Indications: Pre-access. Performing Technologist: June Leap RDMS, RVT  Examination Guidelines: A complete evaluation includes B-mode imaging, spectral Doppler, color Doppler, and power Doppler as needed of all accessible portions of each vessel. Bilateral testing is considered an integral part of a complete examination. Limited examinations for reoccurring indications may be performed as noted. +-----------------+-------------+----------+---------+ Right Cephalic    Diameter (cm)Depth (cm)Findings  +-----------------+-------------+----------+---------+ Shoulder             0.38        0.75             +-----------------+-------------+----------+---------+ Mid upper arm        0.43        0.40             +-----------------+-------------+----------+---------+ Dist upper arm       0.48        0.68             +-----------------+-------------+----------+---------+ Antecubital fossa  0.53        0.84             +-----------------+-------------+----------+---------+ Prox forearm         0.39        0.36             +-----------------+-------------+----------+---------+ Mid forearm          0.37        0.47   branching +-----------------+-------------+----------+---------+ Dist forearm         0.35        0.27             +-----------------+-------------+----------+---------+ +-----------------+-------------+----------+---------+ Left Cephalic    Diameter (cm)Depth (cm)Findings  +-----------------+-------------+----------+---------+ Shoulder             0.42        0.71             +-----------------+-------------+----------+---------+ Mid upper arm        0.34        0.62             +-----------------+-------------+----------+---------+ Dist upper arm       0.42        0.40             +-----------------+-------------+----------+---------+ Antecubital fossa    0.37        0.34   branching +-----------------+-------------+----------+---------+ Prox forearm         0.31        0.21             +-----------------+-------------+----------+---------+ Mid forearm          0.32        0.18             +-----------------+-------------+----------+---------+ Dist forearm         0.34        0.20             +-----------------+-------------+----------+---------+ *See table(s) above for measurements and observations.  Diagnosing physician: Deitra Mayo MD Electronically signed by Deitra Mayo MD on  02/02/2019 at 3:41:59 PM.    Final         Scheduled Meds: . chlorhexidine gluconate (MEDLINE KIT)  15 mL Mouth Rinse BID  . Chlorhexidine Gluconate Cloth  6 each Topical Q0600  . Chlorhexidine Gluconate Cloth  6 each Topical Q0600  . Chlorhexidine Gluconate Cloth  6 each Topical Q0600  . darbepoetin (ARANESP) injection - DIALYSIS  200 mcg Intravenous Q Tue-HD  . dexamethasone  20 mg Intravenous Q24H  . feeding supplement (ENSURE ENLIVE)  237 mL Oral BID BM  . heparin  40 Units/kg Dialysis Once in dialysis  . mouth rinse  15 mL Mouth Rinse q12n4p  . midodrine  10 mg Oral TID WC  . multivitamin  1 tablet Oral QHS  . nystatin   Topical TID  . pantoprazole (PROTONIX) IV  40 mg Intravenous Q12H  . polyethylene glycol  17 g Oral Daily   Continuous Infusions: . sodium chloride    . sodium chloride    . sodium chloride 10 mL/hr at 02/02/19 0341     LOS: 20 days     Cordelia Poche, MD [facility] 02/03/2019, 12:31 PM  If 7PM-7AM, please contact night-coverage [email or web address]

## 2019-02-03 NOTE — Progress Notes (Signed)
Chaplain rec'd call from daughter Scott Mcdowell requesting [phone] requesting assistance with help getting catholic priest for father to do confession.  Chaplain explained visitation policies that changed during Covid and that most parish priests only visit those who belong to their parish. Normally other visits were for end of life or exceptions to the rule.  Daughter explained she wanted to do it over the phone and said her father had attended parish in [place]. (she didn't indicate he was a member there) Before chaplain could make suggestion to contact the [place] priest,s he said "I'll call the priests in [place], they are doing better there." Indicating her desire to make complaint to [facility] legislature on visitations during Covid. she closed call with "I'll pray for you."   Rev. Tamsen Snider Pager [phone]

## 2019-02-03 NOTE — Progress Notes (Signed)
   VASCULAR SURGERY ASSESSMENT & PLAN:   ESRD: The patient has a temporary hemodialysis catheter.  He dialyzes on Tuesday Thursdays and Saturdays.  I will schedule him for placement of a tunneled dialysis catheter and left AV fistula or AV graft on Friday.  Based on his vein map it looks like his upper arm cephalic vein is adequate in size.  However, he may have some intimal thickening in the cephalic vein just below the antecubital crease and the vein is somewhat lateral.  If a fistula is not possible that I have explained we would place an AV graft.  I have discussed the indications for the procedure and the potential complications and all of his questions were answered.   SUBJECTIVE:   No complaints this morning.  PHYSICAL EXAM:   Vitals:   02/02/19 1122 02/02/19 1755 02/02/19 1954 02/03/19 0549  BP: (!) 91/51 (!) 97/58 (!) 101/59 126/72  Pulse: 84 86 79 85  Resp: _0 Temp: 97.9 F (36.6 C) 98.3 F (36.8 C) 98.7 F (37.1 C) 97.8 F (36.6 C)  TempSrc: Oral Oral Oral Oral  SpO2: 96% 96% 97% 96%  Weight:      Height:       Palpable left radial pulse. No IVs in the left arm.  LABS:   VEIN MAP: I have independently interpreted his vein map which shows that the upper arm cephalic vein on the left looks reasonable in size.  Lab Results  Component Value Date   WBC 5.1 02/03/2019   HGB 7.4 (L) 02/03/2019   HCT 23.5 (L) 02/03/2019   MCV 102.6 (H) 02/03/2019   PLT 120 (L) 02/03/2019   Lab Results  Component Value Date   CREATININE 5.48 (H) 02/03/2019   Lab Results  Component Value Date   INR 1.3 (H) 01/21/2019   CBG (last 3)  Recent Labs    01/31/19 1002  GLUCAP 85    PROBLEM LIST:    Principal Problem:   Shock ([place]) Active Problems:   ARF (acute renal failure) (HCC)   Hyperkalemia   B12 deficiency anemia   Pleural effusion on right   Ascites   CAP (community acquired pneumonia)   Elevated troponin   Acute blood loss anemia   Upper GI bleed  Goals of care, counseling/discussion   Lambda light chain myeloma (HCC)   Pressure injury of skin   CURRENT MEDS:   . chlorhexidine gluconate (MEDLINE KIT)  15 mL Mouth Rinse BID  . Chlorhexidine Gluconate Cloth  6 each Topical Q0600  . Chlorhexidine Gluconate Cloth  6 each Topical Q0600  . darbepoetin (ARANESP) injection - DIALYSIS  200 mcg Intravenous Q Tue-HD  . dexamethasone  20 mg Intravenous Q24H  . feeding supplement (ENSURE ENLIVE)  237 mL Oral BID BM  . heparin  40 Units/kg Dialysis Once in dialysis  . mouth rinse  15 mL Mouth Rinse q12n4p  . midodrine  10 mg Oral TID WC  . multivitamin  1 tablet Oral QHS  . nystatin   Topical TID  . pantoprazole (PROTONIX) IV  40 mg Intravenous Q12H  . polyethylene glycol  17 g Oral Daily    Deitra Mayo Beeper: [phone] Office: [phone] 02/03/2019

## 2019-02-03 NOTE — Progress Notes (Signed)
Occupational Therapy Treatment Patient Details Name: Scott Mcdowell MRN: [id number] DOB: [birth year] Today's Date: 02/03/2019    History of present illness 78 yo male admitted with ARF, scrotal cellulitis, Pna. Pt extubated 01/22/19.  Hx of CVA with R hemiparesis   OT comments  Pt in bed upon arrival and  distracted and fixated this visit by awaiting an important, private phone call. Agreeable to stand and transfer to bed, improving, now min A + 2 for stand. Declining ADLs and toilet transfer trg due to phone call. OT will continue to follow acutely  Follow Up Recommendations  CIR    Equipment Recommendations  None recommended by OT    Recommendations for Other Services      Precautions / Restrictions Precautions Precautions: Fall Precaution Comments: R hemiparesis, h/o 1 fall in past 1 year Restrictions Weight Bearing Restrictions: No       Mobility Bed Mobility Overal bed mobility: Needs Assistance Bed Mobility: Supine to Sit     Supine to sit: Mod assist     General bed mobility comments: mod A to progress trunk upright and progress RLE off bed  Transfers Overall transfer level: Needs assistance Equipment used: 2 person hand held assist Transfers: Sit to/from [employer] Transfers Sit to Stand: +2 physical assistance;Min assist Stand pivot transfers: +2 physical assistance;Min assist       General transfer comment: transferred pt  from bed to recliner with min A +2;assist to rise, stabilize and block R knee;transferred pt to the left    Balance Overall balance assessment: Needs assistance Sitting-balance support: No upper extremity supported;Feet supported Sitting balance-Leahy Scale: Fair     Standing balance support: Bilateral upper extremity supported Standing balance-Leahy Scale: Poor Standing balance comment: pt required modA+2 to maintain upright posture                           ADL either performed or assessed with clinical  judgement   ADL Overall ADL's : Needs assistance/impaired                           Toilet Transfer Details (indicate cue type and reason): pt decined ADL tasks this session due to fixation on waiting for an "important, private phone call"                 Vision Patient Visual Report: No change from baseline     Perception     Praxis      Cognition Arousal/Alertness: Awake/alert Behavior During Therapy: Anxious;Agitated Overall Cognitive Status: No family/caregiver present to determine baseline cognitive functioning                                          Exercises Exercises: General Upper Extremity   Shoulder Instructions       General Comments      Pertinent Vitals/ Pain       Pain Assessment: No/denies pain  Home Living                                          Prior Functioning/Environment              Frequency  Min 2X/week  Progress Toward Goals  OT Goals(current goals can now be found in the care plan section)     Acute Rehab OT Goals Patient Stated Goal: to regain PLOF  Plan Discharge plan remains appropriate    Co-evaluation                 AM-PAC OT "6 Clicks" Daily Activity     Outcome Measure   Help from another person eating meals?: A Little Help from another person taking care of personal grooming?: A Lot Help from another person toileting, which includes using toliet, bedpan, or urinal?: A Lot Help from another person bathing (including washing, rinsing, drying)?: A Lot Help from another person to put on and taking off regular upper body clothing?: A Lot Help from another person to put on and taking off regular lower body clothing?: Total 6 Click Score: 12    End of Session Equipment Utilized During Treatment: Gait belt  OT Visit Diagnosis: Unsteadiness on feet (R26.81);Muscle weakness (generalized) (M62.81);Other abnormalities of gait and mobility  (R26.89);History of falling (Z91.81);Repeated falls (R29.6)   Activity Tolerance Patient tolerated treatment well   Patient Left in chair;with call bell/phone within reach;with chair alarm set   Nurse Communication          Time: [phone] OT Time Calculation (min): 13 min  Charges: OT General Charges $OT Visit: 1 Visit OT Treatments $Therapeutic Activity: 8-22 mins     Britt Bottom 02/03/2019, 3:10 PM

## 2019-02-03 NOTE — H&P (View-Only) (Signed)
   VASCULAR SURGERY ASSESSMENT & PLAN:   ESRD: The patient has a temporary hemodialysis catheter.  He dialyzes on Tuesday Thursdays and Saturdays.  I will schedule him for placement of a tunneled dialysis catheter and left AV fistula or AV graft on Friday.  Based on his vein map it looks like his upper arm cephalic vein is adequate in size.  However, he may have some intimal thickening in the cephalic vein just below the antecubital crease and the vein is somewhat lateral.  If a fistula is not possible that I have explained we would place an AV graft.  I have discussed the indications for the procedure and the potential complications and all of his questions were answered.   SUBJECTIVE:   No complaints this morning.  PHYSICAL EXAM:   Vitals:   02/02/19 1122 02/02/19 1755 02/02/19 1954 02/03/19 0549  BP: (!) 91/51 (!) 97/58 (!) 101/59 126/72  Pulse: 84 86 79 85  Resp: _0 Temp: 97.9 F (36.6 C) 98.3 F (36.8 C) 98.7 F (37.1 C) 97.8 F (36.6 C)  TempSrc: Oral Oral Oral Oral  SpO2: 96% 96% 97% 96%  Weight:      Height:       Palpable left radial pulse. No IVs in the left arm.  LABS:   VEIN MAP: I have independently interpreted his vein map which shows that the upper arm cephalic vein on the left looks reasonable in size.  Lab Results  Component Value Date   WBC 5.1 02/03/2019   HGB 7.4 (L) 02/03/2019   HCT 23.5 (L) 02/03/2019   MCV 102.6 (H) 02/03/2019   PLT 120 (L) 02/03/2019   Lab Results  Component Value Date   CREATININE 5.48 (H) 02/03/2019   Lab Results  Component Value Date   INR 1.3 (H) 01/21/2019   CBG (last 3)  Recent Labs    01/31/19 1002  GLUCAP 85    PROBLEM LIST:    Principal Problem:   Shock ([place]) Active Problems:   ARF (acute renal failure) (HCC)   Hyperkalemia   B12 deficiency anemia   Pleural effusion on right   Ascites   CAP (community acquired pneumonia)   Elevated troponin   Acute blood loss anemia   Upper GI bleed  Goals of care, counseling/discussion   Lambda light chain myeloma (HCC)   Pressure injury of skin   CURRENT MEDS:   . chlorhexidine gluconate (MEDLINE KIT)  15 mL Mouth Rinse BID  . Chlorhexidine Gluconate Cloth  6 each Topical Q0600  . Chlorhexidine Gluconate Cloth  6 each Topical Q0600  . darbepoetin (ARANESP) injection - DIALYSIS  200 mcg Intravenous Q Tue-HD  . dexamethasone  20 mg Intravenous Q24H  . feeding supplement (ENSURE ENLIVE)  237 mL Oral BID BM  . heparin  40 Units/kg Dialysis Once in dialysis  . mouth rinse  15 mL Mouth Rinse q12n4p  . midodrine  10 mg Oral TID WC  . multivitamin  1 tablet Oral QHS  . nystatin   Topical TID  . pantoprazole (PROTONIX) IV  40 mg Intravenous Q12H  . polyethylene glycol  17 g Oral Daily    Deitra Mayo Beeper: [phone] Office: [phone] 02/03/2019

## 2019-02-03 NOTE — Progress Notes (Signed)
Renal Navigator has spoken with patient's wife/[name] and OP HD referral has been submitted to [facility]. Renal Navigator will follow up with Nephrologist/Dr. Deterding and patient/wife once seat schedule has been obtained.  Alphonzo Cruise, [place] Renal Navigator [phone]

## 2019-02-03 NOTE — Care Management Important Message (Signed)
Important Message  Patient Details  Name: Scott Mcdowell MRN: [id number] Date of Birth: August 30, 1940   Medicare Important Message Given:  Yes    [name] 02/03/2019, 4:07 PM

## 2019-02-03 NOTE — Progress Notes (Signed)
Patient refused to update his wife as he has already done.

## 2019-02-03 NOTE — Progress Notes (Signed)
Physical Therapy Treatment Patient Details Name: Scott Mcdowell MRN: [id number] DOB: [birth year] Today's Date: 02/03/2019    History of Present Illness 78 yo male admitted with ARF, scrotal cellulitis, Pna. Pt extubated 01/22/19.  Hx of CVA with R hemiparesis    PT Comments    Patient distracted this visit by awaiting an important phone call that he requested privacy for. Agreeable to stand and transfer to bed, improving, now min A of 2 for stand. Declining gait today due to phone call. Cont to rec CIR.     Follow Up Recommendations  CIR     Equipment Recommendations  None recommended by PT    Recommendations for Other Services Rehab consult;OT consult     Precautions / Restrictions Precautions Precautions: Fall Precaution Comments: R hemiparesis, h/o 1 fall in past 1 year Restrictions Weight Bearing Restrictions: No    Mobility  Bed Mobility Overal bed mobility: Needs Assistance Bed Mobility: Supine to Sit     Supine to sit: Mod assist     General bed mobility comments: modA to progress trunk upright and progress RLE off bed  Transfers Overall transfer level: Needs assistance Equipment used: 2 person hand held assist Transfers: Sit to/from Stand;Stand Pivot Transfers Sit to Stand: +2 physical assistance;Min assist Stand pivot transfers: +2 physical assistance;Min assist       General transfer comment: transferred pt with NT from bed to recliner with modA+2;assist to rise, stabilize and block R knee;transferred pt to the left  Ambulation/Gait             General Gait Details: pt declining ambulation anxiously awaiting a phone call   Stairs             Wheelchair Mobility    Modified Rankin (Stroke Patients Only)       Balance Overall balance assessment: Needs assistance Sitting-balance support: No upper extremity supported;Feet supported Sitting balance-Leahy Scale: Fair     Standing balance support: Bilateral upper extremity  supported Standing balance-Leahy Scale: Poor Standing balance comment: pt required modA+2 to maintain upright posture                            Cognition Arousal/Alertness: Awake/alert Behavior During Therapy: WFL for tasks assessed/performed Overall Cognitive Status: Within Functional Limits for tasks assessed                                        Exercises      General Comments        Pertinent Vitals/Pain      Home Living                      Prior Function            PT Goals (current goals can now be found in the care plan section) Acute Rehab PT Goals Patient Stated Goal: to regain PLOF PT Goal Formulation: With patient Time For Goal Achievement: 02/17/19 Potential to Achieve Goals: Good Progress towards PT goals: Progressing toward goals    Frequency    Min 3X/week      PT Plan Current plan remains appropriate    Co-evaluation              AM-PAC PT "6 Clicks" Mobility   Outcome Measure  Help needed turning from your back to your side while in  a flat bed without using bedrails?: A Lot Help needed moving from lying on your back to sitting on the side of a flat bed without using bedrails?: A Lot Help needed moving to and from a bed to a chair (including a wheelchair)?: A Lot Help needed standing up from a chair using your arms (e.g., wheelchair or bedside chair)?: A Lot Help needed to walk in hospital room?: A Lot Help needed climbing 3-5 steps with a railing? : Total 6 Click Score: 11    End of Session Equipment Utilized During Treatment: Gait belt Activity Tolerance: Patient tolerated treatment well Patient left: with call bell/phone within reach;in chair;with chair alarm set Nurse Communication: Mobility status PT Visit Diagnosis: Muscle weakness (generalized) (M62.81);Difficulty in walking, not elsewhere classified (R26.2);Hemiplegia and hemiparesis Hemiplegia - Right/Left: Right Hemiplegia -  dominant/non-dominant: Dominant Hemiplegia - caused by: Cerebral infarction     Time: 8101-7510 PT Time Calculation (min) (ACUTE ONLY): 24 min  Charges:  $Therapeutic Activity: 8-22 mins                    Reinaldo Berber, PT, DPT Acute Rehabilitation Services Pager: [phone] Office: [phone]     Reinaldo Berber 02/03/2019, 2:56 PM

## 2019-02-03 NOTE — Progress Notes (Signed)
ANTICOAGULATION CONSULT NOTE - Initial Consult  Pharmacy Consult for IV heparin Indication: DVT  No Known Allergies  Patient Measurements: Height: 6\' 2"  (188 cm) Weight: 241 lb 6.5 oz (109.5 kg) IBW/kg (Calculated) : 82.2 Heparin Dosing Weight: 104 kg  Vital Signs: Temp: 98 F (36.7 C) (06/17 0900) Temp Source: Oral (06/17 0900) BP: 122/71 (06/17 0900) Pulse Rate: 78 (06/17 0900)  Labs: Recent Labs    02/01/19 0323 02/02/19 0405 02/03/19 0434  HGB  --  7.4* 7.4*  HCT  --  23.5* 23.5*  PLT  --  117* 120*  CREATININE 4.96* 6.01* 5.48*    Estimated Creatinine Clearance: 14.2 mL/min (A) (by C-G formula based on SCr of 5.48 mg/dL (H)).   Medical History: Past Medical History:  Diagnosis Date  . Goals of care, counseling/discussion 01/29/2019  . HTN (hypertension)   . Lambda light chain myeloma ([place]) 01/29/2019  . Stroke [facility]) 2004   w right sided weakness    Medications:  Infusions:  . sodium chloride    . sodium chloride    . sodium chloride 10 mL/hr at 02/02/19 0341  . heparin      Assessment: 78 yo male admitted with edema.  Found with RUE DVT, and also known upper GI bleeding.  Pharmacy asked to begin IV heparin today.  Hgb 7.4, Pltc up to 120  Goal of Therapy:  Heparin level 0.3-0.5 - will target lower end of range given GIB Monitor platelets by anticoagulation protocol: Yes   Plan:  Start IV heparin at 1200 units/hr Check heparin level in 8 hrs Daily heparin level and CBC. F/u plans for oral anticoagulation as able.  Scott Mcdowell, [facility] Clinical Pharmacist Phone [phone]  02/03/2019 1:15 PM

## 2019-02-04 ENCOUNTER — Inpatient Hospital Stay (HOSPITAL_COMMUNITY): Payer: Medicare Other

## 2019-02-04 DIAGNOSIS — R0682 Tachypnea, not elsewhere classified: Secondary | ICD-10-CM

## 2019-02-04 LAB — CBC
HCT: 23.8 % — ABNORMAL LOW (ref 39.0–52.0)
Hemoglobin: 7.5 g/dL — ABNORMAL LOW (ref 13.0–17.0)
MCH: 32.6 pg (ref 26.0–34.0)
MCHC: 31.5 g/dL (ref 30.0–36.0)
MCV: 103.5 fL — ABNORMAL HIGH (ref 80.0–100.0)
Platelets: 136 10*3/uL — ABNORMAL LOW (ref 150–400)
RBC: 2.3 MIL/uL — ABNORMAL LOW (ref 4.22–5.81)
RDW: 17.9 % — ABNORMAL HIGH (ref 11.5–15.5)
WBC: 6.6 10*3/uL (ref 4.0–10.5)
nRBC: 0 % (ref 0.0–0.2)

## 2019-02-04 LAB — RENAL FUNCTION PANEL
Albumin: 3.6 g/dL (ref 3.5–5.0)
Anion gap: 12 (ref 5–15)
BUN: 97 mg/dL — ABNORMAL HIGH (ref 8–23)
CO2: 24 mmol/L (ref 22–32)
Calcium: 8.9 mg/dL (ref 8.9–10.3)
Chloride: 99 mmol/L (ref 98–111)
Creatinine, Ser: 6.63 mg/dL — ABNORMAL HIGH (ref 0.61–1.24)
GFR calc Af Amer: 8 mL/min — ABNORMAL LOW (ref >60)
GFR calc non Af Amer: 7 mL/min — ABNORMAL LOW (ref >60)
Glucose, Bld: 161 mg/dL — ABNORMAL HIGH (ref 70–99)
Phosphorus: 6.7 mg/dL — ABNORMAL HIGH (ref 2.5–4.6)
Potassium: 5.8 mmol/L — ABNORMAL HIGH (ref 3.5–5.1)
Sodium: 135 mmol/L (ref 135–145)

## 2019-02-04 LAB — HEPARIN LEVEL (UNFRACTIONATED): Heparin Unfractionated: 0.63 IU/mL (ref 0.30–0.70)

## 2019-02-04 MED ORDER — MENTHOL 3 MG MT LOZG
1.0000 | LOZENGE | OROMUCOSAL | Status: DC | PRN
  Administered 2019-02-04: 3 mg via ORAL
  Filled 2019-02-04: qty 9

## 2019-02-04 MED ORDER — SODIUM CHLORIDE 0.9 % IV SOLN: 100.0000 mL | INTRAVENOUS | Status: DC | PRN

## 2019-02-04 MED ORDER — LIDOCAINE-PRILOCAINE 2.5-2.5 % EX CREA: 1.0000 "application " | TOPICAL_CREAM | CUTANEOUS | Status: DC | PRN

## 2019-02-04 MED ORDER — ALTEPLASE 2 MG IJ SOLR: 2.0000 mg | Freq: Once | INTRAMUSCULAR | Status: DC | PRN

## 2019-02-04 MED ORDER — HEPARIN SODIUM (PORCINE) 1000 UNIT/ML IJ SOLN
INTRAMUSCULAR | Status: AC
  Administered 2019-02-04: 2800 [IU] via INTRAVENOUS_CENTRAL
  Filled 2019-02-04: qty 3

## 2019-02-04 MED ORDER — HEPARIN SODIUM (PORCINE) 1000 UNIT/ML DIALYSIS: 40.0000 [IU]/kg | Freq: Once | INTRAMUSCULAR | Status: DC

## 2019-02-04 MED ORDER — HEPARIN SODIUM (PORCINE) 1000 UNIT/ML DIALYSIS: 1000.0000 [IU] | INTRAMUSCULAR | Status: DC | PRN

## 2019-02-04 MED ORDER — CEFAZOLIN SODIUM-DEXTROSE 2-4 GM/100ML-% IV SOLN
2.0000 g | INTRAVENOUS | Status: AC
  Administered 2019-02-05: 2 g via INTRAVENOUS

## 2019-02-04 MED ORDER — MIDODRINE HCL 5 MG PO TABS
ORAL_TABLET | ORAL | Status: AC
  Filled 2019-02-04: qty 2

## 2019-02-04 MED ORDER — PENTAFLUOROPROP-TETRAFLUOROETH EX AERO: 1.0000 "application " | INHALATION_SPRAY | CUTANEOUS | Status: DC | PRN

## 2019-02-04 MED ORDER — SEVELAMER CARBONATE 2.4 G PO PACK
2.4000 g | PACK | Freq: Three times a day (TID) | ORAL | Status: DC
  Administered 2019-02-04 – 2019-02-10 (×15): 2.4 g via ORAL
  Filled 2019-02-04 (×14): qty 1

## 2019-02-04 MED ORDER — LIDOCAINE HCL (PF) 1 % IJ SOLN: 5.0000 mL | INTRAMUSCULAR | Status: DC | PRN

## 2019-02-04 NOTE — Progress Notes (Signed)
Patient refused to update his wife as he has already done.

## 2019-02-04 NOTE — Progress Notes (Signed)
Subjective: Interval History: has no complaint, feeling stronger..  Objective: Vital signs in last 24 hours: Temp:  [97.4 F (36.3 C)-97.9 F (36.6 C)] 97.4 F (36.3 C) (06/18 0735) Pulse Rate:  [70-98] 86 (06/18 0900) Resp:  [12-17] 12 (06/18 0800) BP: (94-114)/(56-82) 107/61 (06/18 0900) SpO2:  [96 %-97 %] 96 % (06/18 0735) Weight:  [110.7 kg-111 kg] 110.7 kg (06/18 0735) Weight change: 0.7 kg  Intake/Output from previous day: 06/17 0701 - 06/18 0700 In: 1291.6 [P.O.:1020; I.V.:271.6] Out: 0  Intake/Output this shift: No intake/output data recorded.  General appearance: alert, cooperative, no distress and pale Neck: IJ tmp cath Resp: rhonchi bibasilar Cardio: S1, S2 normal and systolic murmur: late systolic 2/6, crescendo and decrescendo at 2nd left intercostal space GI: soft, pos bs, nontender, liver down 4 cm Extremities: edema 2+  Lab Results: Recent Labs    02/03/19 0434 02/04/19 0428  WBC 5.1 6.6  HGB 7.4* 7.5*  HCT 23.5* 23.8*  PLT 120* 136*   BMET:  Recent Labs    02/03/19 1723 02/04/19 0428  NA 135 135  K 5.0 5.8*  CL 98 99  CO2 24 24  GLUCOSE 119* 161*  BUN 80* 97*  CREATININE 5.68* 6.63*  CALCIUM 9.0 8.9   Recent Labs    02/02/19 0739  PTH 40  Comment   Iron Studies: No results for input(s): IRON, TIBC, TRANSFERRIN, FERRITIN in the last 72 hours.  Studies/Results: Vas [place] Upper Ext Vein Mapping (pre-op Avf)  Result Date: 02/02/2019 UPPER EXTREMITY VEIN MAPPING  Indications: Pre-access. Performing Technologist: Scott Mcdowell RDMS, RVT  Examination Guidelines: A complete evaluation includes B-mode imaging, spectral Doppler, color Doppler, and power Doppler as needed of all accessible portions of each vessel. Bilateral testing is considered an integral part of a complete examination. Limited examinations for reoccurring indications may be performed as noted. +-----------------+-------------+----------+---------+ Right Cephalic   Diameter  (cm)Depth (cm)Findings  +-----------------+-------------+----------+---------+ Shoulder             0.38        0.75             +-----------------+-------------+----------+---------+ Mid upper arm        0.43        0.40             +-----------------+-------------+----------+---------+ Dist upper arm       0.48        0.68             +-----------------+-------------+----------+---------+ Antecubital fossa    0.53        0.84             +-----------------+-------------+----------+---------+ Prox forearm         0.39        0.36             +-----------------+-------------+----------+---------+ Mid forearm          0.37        0.47   branching +-----------------+-------------+----------+---------+ Dist forearm         0.35        0.27             +-----------------+-------------+----------+---------+ +-----------------+-------------+----------+---------+ Left Cephalic    Diameter (cm)Depth (cm)Findings  +-----------------+-------------+----------+---------+ Shoulder             0.42        0.71             +-----------------+-------------+----------+---------+ Mid upper arm        0.34  0.62             +-----------------+-------------+----------+---------+ Dist upper arm       0.42        0.40             +-----------------+-------------+----------+---------+ Antecubital fossa    0.37        0.34   branching +-----------------+-------------+----------+---------+ Prox forearm         0.31        0.21             +-----------------+-------------+----------+---------+ Mid forearm          0.32        0.18             +-----------------+-------------+----------+---------+ Dist forearm         0.34        0.20             +-----------------+-------------+----------+---------+ *See table(s) above for measurements and observations.  Diagnosing physician: Scott Mayo MD Electronically signed by Scott Mayo MD on 02/02/2019  at 3:41:59 PM.    Final     I have reviewed the patient's current medications.  Assessment/Plan: 1 ESRD from MM.  For Access tomorrow.  HD today. Vol xs but bp limiting removal 2 Anemia stable, esa, Fe 3 HPTH ok needs binder 4 MM per Heme 5 GIB stable P access, HD, esa, Deca    LOS: 21 days   Scott Mcdowell 02/04/2019,9:04 AM

## 2019-02-04 NOTE — Progress Notes (Signed)
Patient has been accepted for OP HD treatment at [facility] on a TTS schedule with a seat time of 12:15pm. He needs to arrive at 11:15am on his first day of treatment to complete paperwork if the first day of treatment falls on a T/Th or go to the clinic on Friday to complete paperwork for a Saturday start.  Renal Navigator notified Nephrologist/Dr. Deterding and patient's wife/[name] of above. Renal Navigator understands that patient is being evaluated for acceptance to CIR. Renal Navigator notified CIR AC/B. Boyette of patient's acceptance and schedule at OP HD clinic. Renal Navigator will monitor chart for discharge plan and notify clinic of patient's start date when it has been determined.  Alphonzo Cruise, [place] Renal Navigator [phone]

## 2019-02-04 NOTE — Anesthesia Preprocedure Evaluation (Addendum)
Anesthesia Evaluation  Patient identified by MRN, date of birth, ID band Patient awake    Reviewed: Allergy & Precautions, H&P , NPO status , Patient's Chart, lab work & pertinent test results  Airway Mallampati: II  TM Distance: >3 FB Neck ROM: Full    Dental no notable dental hx. (+) Partial Lower, Partial Upper, Dental Advisory Given   Pulmonary neg pulmonary ROS, former smoker,    Pulmonary exam normal breath sounds clear to auscultation       Cardiovascular Exercise Tolerance: Good hypertension, Pt. on medications  Rhythm:Regular Rate:Normal     Neuro/Psych CVA, Residual Symptoms negative psych ROS   GI/Hepatic negative GI ROS, Neg liver ROS,   Endo/Other  negative endocrine ROS  Renal/GU ESRF and DialysisRenal disease  negative genitourinary   Musculoskeletal   Abdominal   Peds  Hematology  (+) Blood dyscrasia, anemia ,   Anesthesia Other Findings   Reproductive/Obstetrics negative OB ROS                            Anesthesia Physical Anesthesia Plan  ASA: III  Anesthesia Plan: MAC   Post-op Pain Management:    Induction: Intravenous  PONV Risk Score and Plan: 2 and Propofol infusion and Ondansetron  Airway Management Planned: Simple Face Mask  Additional Equipment:   Intra-op Plan:   Post-operative Plan:   Informed Consent: I have reviewed the patients History and Physical, chart, labs and discussed the procedure including the risks, benefits and alternatives for the proposed anesthesia with the patient or authorized representative who has indicated his/her understanding and acceptance.     Dental advisory given  Plan Discussed with: CRNA  Anesthesia Plan Comments:         Anesthesia Quick Evaluation

## 2019-02-04 NOTE — Progress Notes (Signed)
Physical Therapy Treatment Patient Details Name: Scott Mcdowell MRN: [id number] DOB: 06-11-1941 Today's Date: 02/04/2019    History of Present Illness 78 yo male admitted with ARF, scrotal cellulitis, Pna. Pt extubated 01/22/19.  Hx of CVA with R hemiparesis    PT Comments    Patient reports feeling mildly weaker today, unable to come to standing with max A of 1, will need 2 therapists next visit. Improved bed mobility mod A to come to sitting today. Cont to rec CIR.      Follow Up Recommendations  CIR     Equipment Recommendations  None recommended by PT    Recommendations for Other Services Rehab consult;OT consult     Precautions / Restrictions Precautions Precautions: Fall Precaution Comments: R hemiparesis, h/o 1 fall in past 1 year Restrictions Weight Bearing Restrictions: No    Mobility  Bed Mobility Overal bed mobility: Needs Assistance Bed Mobility: Supine to Sit     Supine to sit: Mod assist     General bed mobility comments: modA to progress trunk upright and progress RLE off bed  Transfers Overall transfer level: Needs assistance Equipment used: 2 person hand held assist             General transfer comment: unale to come to standing today from elevated surface and +1 max. pt reports he feels more fatigue today than yesterday where he was +2 min A  Ambulation/Gait             General Gait Details: pt declining ambulation anxiously awaiting a phone call   Stairs             Wheelchair Mobility    Modified Rankin (Stroke Patients Only)       Balance Overall balance assessment: Needs assistance Sitting-balance support: No upper extremity supported;Feet supported Sitting balance-Leahy Scale: Fair     Standing balance support: Bilateral upper extremity supported Standing balance-Leahy Scale: Poor Standing balance comment: pt required modA+2 to maintain upright posture                            Cognition  Arousal/Alertness: Awake/alert Behavior During Therapy: WFL for tasks assessed/performed Overall Cognitive Status: Within Functional Limits for tasks assessed                                        Exercises      General Comments        Pertinent Vitals/Pain      Home Living                      Prior Function            PT Goals (current goals can now be found in the care plan section) Acute Rehab PT Goals Patient Stated Goal: to regain PLOF PT Goal Formulation: With patient Time For Goal Achievement: 02/17/19 Potential to Achieve Goals: Good Progress towards PT goals: Progressing toward goals    Frequency    Min 3X/week      PT Plan Current plan remains appropriate    Co-evaluation              AM-PAC PT "6 Clicks" Mobility   Outcome Measure  Help needed turning from your back to your side while in a flat bed without using bedrails?: A Lot Help needed moving from  lying on your back to sitting on the side of a flat bed without using bedrails?: A Lot Help needed moving to and from a bed to a chair (including a wheelchair)?: A Lot Help needed standing up from a chair using your arms (e.g., wheelchair or bedside chair)?: A Lot Help needed to walk in hospital room?: A Lot Help needed climbing 3-5 steps with a railing? : Total 6 Click Score: 11    End of Session Equipment Utilized During Treatment: Gait belt Activity Tolerance: Patient tolerated treatment well Patient left: with call bell/phone within reach;in chair;with chair alarm set Nurse Communication: Mobility status PT Visit Diagnosis: Muscle weakness (generalized) (M62.81);Difficulty in walking, not elsewhere classified (R26.2);Hemiplegia and hemiparesis Hemiplegia - Right/Left: Right Hemiplegia - dominant/non-dominant: Dominant Hemiplegia - caused by: Cerebral infarction     Time: 1450-1510 PT Time Calculation (min) (ACUTE ONLY): 20 min  Charges:  $Therapeutic  Activity: 8-22 mins                     Reinaldo Berber, PT, DPT Acute Rehabilitation Services Pager: [phone] Office: [phone]     Reinaldo Berber 02/04/2019, 3:15 PM

## 2019-02-04 NOTE — Progress Notes (Signed)
ANTICOAGULATION CONSULT NOTE  Pharmacy Consult for heparin Indication: DVT  No Known Allergies  Patient Measurements: Height: 6\' 2"  (188 cm) Weight: 241 lb 6.5 oz (109.5 kg) IBW/kg (Calculated) : 82.2 Heparin Dosing Weight: 104 kg  Vital Signs: Temp: 97.4 F (36.3 C) (06/17 2020) Temp Source: Oral (06/17 2020) BP: 106/67 (06/17 2020) Pulse Rate: 70 (06/17 2020)  Labs: Recent Labs    02/02/19 0405 02/03/19 0434 02/03/19 1723 02/03/19 2327  HGB 7.4* 7.4*  --   --   HCT 23.5* 23.5*  --   --   PLT 117* 120*  --   --   HEPARINUNFRC  --   --   --  0.45  CREATININE 6.01* 5.48* 5.68*  --     Estimated Creatinine Clearance: 13.7 mL/min (A) (by C-G formula based on SCr of 5.68 mg/dL (H)).  Assessment: 78 yo male with RUE DVT for heparin  Goal of Therapy:  Heparin level 0.3-0.5 - will target lower end of range given GIB Monitor platelets by anticoagulation protocol: Yes   Plan:  Continue Heparin at current rate   Phillis Knack, PharmD, BCPS   02/04/2019 12:12 AM

## 2019-02-04 NOTE — Progress Notes (Signed)
Inpatient Rehabilitation Admissions Coordinator  I met with patient at bedside to discuss his preference for rehab venue. He gave me permission to contact his wife to discuss. I contacted wife by phone and discussed goals and expectations of an inpt rehab admit. She would like to discus with patient and family and will return call with preference for CIR vs SNF when medically ready.    , RN, MSN Rehab Admissions Coordinator [phone] 02/04/2019 2:17 PM  

## 2019-02-04 NOTE — PMR Pre-admission (Addendum)
PMR Admission Coordinator Pre-Admission Assessment  Patient: Scott Mcdowell is an 78 y.o., male MRN: [id number] DOB: March 14, 1941 Height: _0  (188 cm) Weight: 107.7 kg              Insurance Information HMO: yes    PPO:      PCP:      IPA:      80/20:      OTHER: medicare advantage PRIMARY: UHC medicare      Policy#: [id number]      Subscriber: pt CM Name: Lenna Sciara      Phone#: [phone] ext [phone]     Fax#: [phone] Pre-Cert#: [id number] approved for 7 days f/u with [facility] phone [phone] ext [phone]      Employer: retired Benefits:  Phone #: [phone]     Name: 6/22 Eff. Date: 08/19/2018     Deduct: none      Out of Pocket Max: $3600      Life Max: none CIR: $295 co pay per day days 1 until 5      SNF: no co pay days 1 until 20; $160 co pay per day days 21 until 43; no co pay days 44 until 100 Outpatient: $30 co pay per visit      Co-Pay: visits per medical neccesity Home Health: 100%      Co-Pay: visits per medical neccesity DME: 80%     Co-Pay: 20% Providers: in network   SECONDARY: none     Medicaid Application Date:       Case Manager:  Disability Application Date:       Case Worker:   The "Data Collection Information Summary" for patients in Inpatient Rehabilitation Facilities with attached "Privacy Act [place] Records" was provided and verbally reviewed with: Patient  Emergency Contact Information Contact Information    Name Relation Home Work [address]   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   Presented to [facility] ED 01/15/2019 with reports of shortness of breath times the past few weeks, scrotal edema.  Sodium 137, potassium 5.6, BUN 80, creatinine  2.5.  Troponin  0.07.  COVID negative.  CT chest abdomen pelvis showed moderate right pleural effusion associated with atelectasis versus consolidation.  Mild left basilar scarring.  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  The right common iliac measuring 2.3 cm in caliber left common iliac measuring 2.3.  Testicular ultrasound no evidence of testicular mass.  Patient was transferred to [facility] for further evaluation.  Patient underwent right thoracentesis 01/16/2019 yielding 600 mL for pleural effusion.  He did require intubation through 01/22/2019.  Maintained on broad-spectrum antibiotics for suspected pneumonia.  Hospital course complicated by AKI with follow-up per renal services circulatory shock requiring vasopressors.  On 01/19/2019 diagnosis of right brachial vein DVT started on heparin.  Developed GI bleed underwent EGD 01/21/2019 adherent clot friable mucosa distal gastric body area was clipped per Dr. Havery Moros.  His anticoagulation was initially discontinued due to high risk of bleeding.  To date patient received 6 units of packed red blood cells, 1 unit of platelets and 3 units of fresh frozen plasma.  Hemoglobin stabilized patient was transitioned  to Eliquis for DVT and latest hemoglobin 7.2.  Patient was found to have a lytic rib lesion concerning for possible malignancy.  A multiple myeloma panel and bone marrow biopsy were performed with follow-up per oncology services Dr. Marin Olp.  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  Plan would be follow-up outpatient with oncology services.  Patient's creatinine 6.50-4.61 hemodialysis has been initiated plan long-term hemodialysis Tuesday Thursday Saturday schedule.  Patient with right IJ tunneled catheter and left brachiocephalic AV fistula has been  placed. Patient with episodic SVT 02/08/2019 initially on metoprolol discontinued started on amiodarone loaded 200 mg twice daily changed to 100 mg twice daily 02/10/2019.    Past Medical History  Past Medical History:  Diagnosis Date  . Goals of care, counseling/discussion 01/29/2019  . HTN (hypertension)   . 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 Stroke [facility]) 2004   w right sided weakness    Family History  family history includes Cancer in his mother; Parkinson's disease in his father.  Prior Rehab/Hospitalizations:  Has the patient had prior rehab or hospitalizations prior to admission? Yes  Has the patient had major surgery during 100 days prior to admission? Yes  Current Medications   Current Facility-Administered Medications:  .  0.9 %  sodium chloride infusion, , Intravenous, PRN, Dagoberto Ligas, PA-C, Stopped at 02/03/19 1409 .  [COMPLETED] apixaban (ELIQUIS) tablet 10 mg, 10 mg, Oral, BID, 10 mg at 02/09/19 2248 **FOLLOWED BY** apixaban (ELIQUIS) tablet 5 mg, 5 mg, Oral, BID, Verlon Au, Jai-Gurmukh, MD, 5 mg at 02/10/19 0802 .  chlorhexidine gluconate (MEDLINE KIT) (PERIDEX) 0.12 % solution 15 mL, 15 mL, Mouth Rinse, BID, Eveland, Matthew, PA-C, 15 mL at 02/10/19 0800 .  Chlorhexidine Gluconate Cloth 2 % PADS 6 each, 6 each, Topical, Q0600, Dagoberto Ligas, PA-C, 6 each at 02/10/19 0542 .  Darbepoetin Alfa (ARANESP) injection 200 mcg, 200 mcg, Intravenous, Q Tue-HD, Eveland, Matthew, PA-C, 200 mcg at 02/09/19 1427 .  feeding supplement (NEPRO CARB STEADY) liquid 237 mL, 237 mL, Oral, TID BM, Mariel Aloe, MD, 237 mL at 02/10/19 0932 .  HYDROcodone-acetaminophen (NORCO/VICODIN) 5-325 MG per tablet 1-2 tablet, 1-2 tablet, Oral, Q4H PRN, Dagoberto Ligas, PA-C, 1 tablet at 02/05/19 2145 .  MEDLINE mouth rinse, 15 mL, Mouth Rinse, q12n4p, Eveland, Matthew, PA-C, 15 mL at 02/10/19 1201 .  menthol-cetylpyridinium (CEPACOL) lozenge 3 mg, 1 lozenge, Oral, PRN, Dagoberto Ligas, PA-C, 3 mg at 02/04/19 1747 .  midodrine (PROAMATINE) tablet 10 mg, 10 mg, Oral, TID WC, Eveland, Matthew, PA-C, 10 mg at 02/10/19 1202 .  multivitamin (RENA-VIT) tablet 1 tablet, 1 tablet, Oral, QHS, Dagoberto Ligas, PA-C, 1 tablet at 02/09/19 2248 .  nystatin (MYCOSTATIN/NYSTOP) topical powder, , Topical, TID, Eveland, Matthew, PA-C .  pantoprazole (PROTONIX) EC tablet 40 mg, 40 mg, Oral, Daily, Mariel Aloe, MD, 40 mg at 02/10/19 0800 .  polyethylene glycol (MIRALAX / GLYCOLAX) packet 17 g, 17 g, Oral, Daily, Dagoberto Ligas, PA-C, 17 g at 02/10/19 0932 .  senna-docusate (Senokot-S) tablet 1 tablet, 1 tablet, Oral, QHS, Nita Sells, MD, 1 tablet at 02/09/19 2248 .  sevelamer carbonate (RENVELA) powder PACK 2.4 g, 2.4 g, Oral, TID WC, Eveland, Matthew, PA-C, 2.4 g at 02/10/19 1201 .  sodium chloride (OCEAN) 0.65 % nasal spray 1 spray, 1 spray, Each Nare, PRN, Dagoberto Ligas, PA-C, 1 spray at 01/20/19 0957 .  sodium chloride flush (NS)  0.9 % injection 10-40 mL, 10-40 mL, Intracatheter, PRN, Dagoberto Ligas, PA-C, 30 mL at 01/30/19 2311  Patients Current Diet:  Diet Order            Diet renal with fluid restriction Fluid restriction: 1200 mL Fluid; Room service appropriate? Yes; Fluid consistency: Thin  Diet effective now              Precautions / Restrictions Precautions Precautions: Fall Precaution Comments: R hemiparesis, h/o 1 fall in past 1 year Restrictions Weight Bearing Restrictions: No   Has the patient had 2 or more falls or a fall with injury in the past year?No  Prior Activity Level Limited Community (1-2x/wk): Mod I with RW  Prior Functional Level Prior Function Level of Independence: Independent with assistive device(s) Comments: uses RW vs cane PRN. "On a good day" pt can ambulate short distances unassisted.   Was able to write and use R hand functionally  Self Care: Did the patient need help bathing, dressing, using the toilet or  eating?  Independent  Indoor Mobility: Did the patient need assistance with walking from room to room (with or without device)? Independent  Stairs: Did the patient need assistance with internal or external stairs (with or without device)? Needed some help  Functional Cognition: Did the patient need help planning regular tasks such as shopping or remembering to take medications? Needed some help  Home Assistive Devices / [place] Devices/Equipment: Cane (specify quad or straight) Home Equipment: Walker - 2 wheels, Cane - single point  Prior Device Use: Indicate devices/aids used by the patient prior to current illness, exacerbation or injury? Walker and cane  Current Functional Level Cognition  Overall Cognitive Status: Within Functional Limits for tasks assessed Orientation Level: Oriented X4    Extremity Assessment (includes Sensation/Coordination)  Upper Extremity Assessment: RUE deficits/detail RUE Deficits / Details: edema noted in RUE; continued to educate pt on using LUE for PROM of RUE  LUE Deficits / Details: CVA 2004. decreased AROM in all planes of motion.  Noted significant edema. RN notified.  Positioned RUE on 2 pillows   Lower Extremity Assessment: Defer to PT evaluation RLE Deficits / Details: hip flex 2/5, knee ext 3-/5 RLE Sensation: decreased proprioception RLE Coordination: decreased gross motor, decreased fine motor    ADLs  Overall ADL's : Needs assistance/impaired Eating/Feeding: Set up, Sitting Eating/Feeding Details (indicate cue type and reason): pt required assistance with removing lids Grooming: Set up, Sitting Upper Body Bathing: Minimal assistance, Sitting Upper Body Bathing Details (indicate cue type and reason): minA to wash LUE;limited functional use of RUE Lower Body Bathing: Total assistance, +2 for physical assistance, Sit to/from stand(pt 10%) Upper Body Dressing : Moderate assistance, Maximal assistance Lower Body Dressing:  Total assistance, +2 for physical assistance, Sit to/from stand Toilet Transfer: Moderate assistance, +2 for physical assistance, Stand-pivot, Control and instrumentation engineer Details (indicate cue type and reason): pt decined ADL tasks this session due to fixation on waiting for an "important, private phone call" [place] and Hygiene: Maximal assistance, Moderate assistance Toileting - Clothing Manipulation Details (indicate cue type and reason): pt rolled R<>L for pericare Functional mobility during ADLs: Moderate assistance, +2 for physical assistance General ADL Comments: pt completed ADL while sitting in supported seat;use of maximove to get pt to chair to maximize pt's participation during session    Mobility  Overal bed mobility: Needs Assistance Bed Mobility: Supine to Sit Rolling: Mod assist Supine to sit: Mod assist Sit to supine: Mod assist  General bed mobility comments: mod A for trunk and LEs    Transfers  Overall transfer level: Needs assistance Equipment used: Rolling walker (2 wheeled) Transfer via Lift Equipment: Maximove Transfers: Sit to/from Stand, Risk manager Sit to Stand: Max assist Stand pivot transfers: +2 physical assistance, Min assist General transfer comment: pt performed sit <> stand from elevated bed with max A with LT UE pulling up on RW.  once standing pt able to stand with min A 2 x 3 minutes for performing cleaning and hygiene    Ambulation / Gait / Stairs / Wheelchair Mobility  Ambulation/Gait Ambulation/Gait assistance: Min assist, +2 safety/equipment Gait Distance (Feet): 5 Feet Assistive device: Rolling walker (2 wheeled) Gait Pattern/deviations: Step-to pattern General Gait Details: pt declining ambulation anxiously awaiting a phone call Gait velocity: decr    Posture / Balance Dynamic Sitting Balance Sitting balance - Comments: pt able to sit edge of bed and scoot edge of bed with min facilitaiton for hand  placement and wt shifts Balance Overall balance assessment: Needs assistance Sitting-balance support: No upper extremity supported, Feet supported Sitting balance-Leahy Scale: Fair Sitting balance - Comments: pt able to sit edge of bed and scoot edge of bed with min facilitaiton for hand placement and wt shifts Standing balance support: Bilateral upper extremity supported Standing balance-Leahy Scale: Poor Standing balance comment: pt required modA+2 to maintain upright posture    Special needs/care consideration BiPAP/CPAP n/a CPM n/a Continuous Drip IV n/a Dialysis OP new hemodialysis this admission [facility] sat 12:15 pm Life Vest n/a Oxygen n/a Special Bed n/a Trach Size n/a Wound Vac n/a Skin Prevalon boots due to heel breakdown; ecchymosis to right arm and left leg; MASD groin and perineum, skin tear right buttocks; Stage 1 sacrum, left and right heel and stage 2 buttocks 0.5 x 0.5 x 0.25 cm Bowel mgmt: continent LBM 6/22 Bladder mgmt: oliguric Diabetic mgmt n/a Behavioral consideration  N/a Chemo/radiation  N/a    Previous Home Environment  Living Arrangements: Spouse/significant other  Lives With: Spouse Available Help at Discharge: Family, Available 24 hours/day Type of Home: House Home Layout: Two level, Able to live on main level with bedroom/bathroom Home Access: Stairs to enter Entrance Stairs-Rails: None Entrance Stairs-Number of Steps: 3 (uses door/frame) to hold on to [employer]: Tub/shower unit, Architectural technologist: Standard Bathroom Accessibility: Yes How Accessible: Accessible via walker Home Care Services: No  Discharge Living Setting Plans for Discharge Living Setting: Patient's home, Lives with (comment)(spouse) Type of Home at Discharge: House Discharge Home Layout: Two level, Able to live on main level with bedroom/bathroom Discharge Home Access: Stairs to enter Entrance Stairs-Rails: None(uses door frame to hold  onto) Entrance Stairs-Number of Steps: 3 Discharge Bathroom Shower/Tub: Tub/shower unit, Curtain Discharge Bathroom Toilet: Standard Discharge Bathroom Accessibility: Yes How Accessible: Accessible via walker Does the patient have any problems obtaining your medications?: No  Social/Family/Support Systems Patient Roles: Spouse, Parent Contact Information: wife, [name] Anticipated Caregiver: wife and family Anticipated Caregiver's Contact Information: see above Caregiver Availability: 24/7 Discharge Plan Discussed with Primary Caregiver: Yes Is Caregiver In Agreement with Plan?: Yes Does Caregiver/Family have Issues with Lodging/Transportation while Pt is in Rehab?: No  Goals/Additional Needs Patient/Family Goal for Rehab: supervision with PT and OT Expected length of stay: ELOS 14 to 17 days Special Service Needs: Catholic Pt/Family Agrees to Admission and willing to participate: Yes Program Orientation Provided & Reviewed with Pt/Caregiver Including Roles  & Responsibilities: Yes  Decrease burden of Care through  IP rehab admission: n/a  Possible need for SNF placement upon discharge: Possibly  Patient Condition: This patient's medical and functional status has changed since the consult dated: 01/28/2019 in which the Rehabilitation Physician determined and documented that the patient's condition is appropriate for intensive rehabilitative care in an inpatient rehabilitation facility. See "History of Present Illness" (above) for medical update. Functional changes are: overall max assist. Patient's medical and functional status update has been discussed with the Rehabilitation physician and patient remains appropriate for inpatient rehabilitation. Will admit to inpatient rehab today.  Preadmission Screen Completed By: Danne Baxter RN MSN Cleatrice Burke, RN, 02/10/2019 1:34 PM ______________________________________________________________________   Discussed status with Dr.  Posey Pronto on 02/10/2019 at 1334 and received approval for admission today.  Admission Coordinator:  Cleatrice Burke, time 4360 Date 02/10/2019

## 2019-02-04 NOTE — Progress Notes (Signed)
ANTICOAGULATION CONSULT NOTE  Pharmacy Consult for heparin Indication: DVT  No Known Allergies  Patient Measurements: Height: 6\' 2"  (188 cm) Weight: 238 lb 5.1 oz (108.1 kg) IBW/kg (Calculated) : 82.2 Heparin Dosing Weight: 104 kg  Vital Signs: Temp: 97.7 F (36.5 C) (06/18 1321) Temp Source: Oral (06/18 1321) BP: 106/65 (06/18 1321) Pulse Rate: 79 (06/18 1321)  Labs: Recent Labs    02/02/19 0405 02/03/19 0434 02/03/19 1723 02/03/19 2327 02/04/19 0428  HGB 7.4* 7.4*  --   --  7.5*  HCT 23.5* 23.5*  --   --  23.8*  PLT 117* 120*  --   --  136*  HEPARINUNFRC  --   --   --  0.45 0.63  CREATININE 6.01* 5.48* 5.68*  --  6.63*    Estimated Creatinine Clearance: 11.6 mL/min (A) (by C-G formula based on SCr of 6.63 mg/dL (H)).  Assessment: 78 yo male with RUE DVT for heparin; heparin level at 04:28 today (6/18) was 0.63, which is higher than the desired target level for this pt, due to hx of GI bleed.  Per RN, no issues with heparin drip and no signs/symptoms of bleeding.  Goal of Therapy:  Heparin level 0.3-0.5 - will target lower end of range given GIB Monitor platelets by anticoagulation protocol: Yes   Plan:  Decrease heparin infusion rate to 1050 units/hr and re-check heparin level ~8 hrs after infusion rate change.  Gillermina Hu, PharmD, BCPS, [facility] Clinical Pharmacist  02/04/2019 4:49 PM

## 2019-02-04 NOTE — Progress Notes (Signed)
PROGRESS NOTE    Scott Mcdowell  BTD:[id number] DOB: 1940-11-29 DOA: 01/14/2019 PCP: [name], MD   Brief Narrative: Scott Mcdowell is a 77 y.o. male presenting with edema. Patient admitted with pneumonia and his hospital course has been complicated by AKI requiring renal replacement therapy, circulatory shock requiring vasopressors, respiratory failure requiring brief intubation, acute blood loss anemia from bleeding upper GI sources requiring EGD intervention and right upper extremity DVT requiring anticoagulation prior to being now complicated by upper GI bleeding.    Assessment & Plan:   Principal Problem:   Shock ([place]) Active Problems:   ARF (acute renal failure) (HCC)   Hyperkalemia   B12 deficiency anemia   Pleural effusion on right   Ascites   CAP (community acquired pneumonia)   Elevated troponin   Acute blood loss anemia   Upper GI bleed   Goals of care, counseling/discussion   Lambda light chain myeloma (HCC)   Pressure injury of skin   ESRD Hyperkalemia Initially managed with diuresis with poor effect. While in ICU, he was started on CRRT. Deemed ESRD. No urine output documented over last 24 hours. -Nephrology recommendations: HD -Strict urine output -Access planned for 6/19  Hypotension/shock Patient required the use of vasopressors while in the ICU. Concern for relative adrenal insufficiency in the ICU and has been started on hydrocortisone in addition to midodrine. Blood pressure still soft and acutely worsened in setting of worsening anemia and recurrent melena. Hydrocortisone discontinued. -Continue midodrine  Anemia Thrombocytopenia Acute blood loss anemia Upper GI bleeding Hemoglobin stable. Recent EGD performed on 6/4 and significant for multiple areas of bleeding which were treated with clip and epi. Iron studies normal except a mildly elevated ferritin. GI signed off and recommending to restart anticoagulation as indicated.  Unfortunately, evidence he is possibly re-bleeding. To date, he has had 8 units of PRBC, 1 units of platelets and 3 units of FFP. Repeat EGD (6/14) without source of active bleeding. Hemoglobin stable. -Daily CBC  Right upper extremity DVT Treatment complicated by anemia requiring multiple transfusions, in addition to epistaxis and hemoptysis. Hemoglobin stable currently. -Will likely need to start Coumadin in light of renal disease, however, still high risk for bleeding. Hematology recommending Eliquis -Continue Heparin drip for now since he will be going for procedures  Tachypnea Appears to be new. Patient with a recent right upper extremity DVT, not previously on anticoagulation secondary to recent active GI bleeding requiring endoscopic management. Now heparin drip. Also with recent pneumonia -Chest x-ray  Pneumonia Associated right para-pneumonia effusion. Completed antibiotic course. Also underwent thoracentesis. 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  History of CVA Associated right sided deficits. Per patient, he was able to ambulate pretty well prior to current illness. PT/OT recommending CIR. CIR recommends consideration when patient is closer to discharge/oncology recs. Continues to work with PT.  Pressure injuries Sacrum, left heel, right heel, POA  Cirrhosis Seen on CT. Will need outpatient follow-up.  Obesity Body mass index is 31.33 kg/m.   DVT prophylaxis: SCDs, Heparin drip Code Status:   Code Status: Full Code Family Communication: None Disposition Plan: Possible CIR if able vs SNF pending management of kidney failure   Consultants:   PCCM  Nephrology  [facility]  Procedures:   6/4: EGD Impression:                -  Normal esophagus.                           - Adherent clot in the distal gastric body, unclear                            if NG tube trauma but treated with hemostasis clip                            and epi. Friable mucosa in the proximal stomach                            with contact oozing.                           - Red blood with active bleeding and clots in the                            duodenal bulb. 4 discrete areas of adherent clot                            with extremely friable tissue but no focal                            ulceration appreciated. One of these areas had a                            visible vessel which was bleeding, another lesion                            had active oozing. 2 areas treated as outlined                            above. 4 clips placed total in the duodenal bulb.                            Patient with lytic lesions noted on xray, unclear                            if he has underlying myeloma? These lesions appear                            to have high risk for relbeeding in the setting of                            coagulopathy. No active bleeding noted following                            endoscopic intervention.  Recommendation:           - Patient to remain in the ICU for ongoing care.                           -  NPO.                           - Continue present medications including IV PPI                           - Serial Hgb                           - Further workup pending course. If he survives                            these acute issues, may need repeat EGD to                            re-evaluate duodenal bulb following treatment with                            PPI and workup underlying liver disease   6/14: EGD Impression:               - Normal appearing, widely patent esophagus and GEJ.                           - Moderate patchy gastritis.                           - Small hiatal hernia.                            - Erythematous duodenopathy.                           - No specimens collected. Recommendation:           - Full liquid diet today.                           - Continue present medications.                           - Serial CBC's.  Antimicrobials:  Azithro 5/29 >> 5/31   Vanco 5/29 >> 5/30  Cefepime 5/30 >> 6/4    Subjective: No concerns, although when asked, he does reports some dyspnea. He states the dyspnea is not new and likely secondary to allergies.  Objective: Vitals:   02/04/19 0800 02/04/19 0830 02/04/19 0900 02/04/19 0930  BP: 104/66 109/67 107/61 105/62  Pulse: 98 82 86 82  Resp: 12     Temp:      TempSrc:      SpO2:      Weight:      Height:        Intake/Output Summary (Last 24 hours) at 02/04/2019 0955 Last data filed at 02/04/2019 0600 Gross per 24 hour  Intake 1051.57 ml  Output 0 ml  Net 1051.57 ml   Filed Weights   02/02/19 1036 02/04/19 0349 02/04/19 0735  Weight: 109.5 kg 111 kg 110.7 kg    Examination:  General exam: Appears calm and comfortable. In HD Respiratory system: Clear to auscultation. Subcostal retractions, slight tachypnea  Cardiovascular system: S1 & S2 heard, RRR. 2/6 systolic murmur Gastrointestinal system: Abdomen is nondistended, soft and nontender. No organomegaly or masses felt. Normal bowel sounds heard. Central nervous system: Alert and oriented. No focal neurological deficits. Extremities: No calf tenderness. Right arm swelling Skin: No cyanosis. No rashes Psychiatry: Judgement and insight appear normal. Mood & affect appropriate.   Data Reviewed: I have personally reviewed following labs and imaging studies  CBC: Recent Labs  Lab 01/30/19 0308 01/30/19 1203 01/31/19 0509 02/02/19 0405 02/03/19 0434 02/04/19 0428  WBC 7.6  --  5.5 6.0 5.1 6.6  HGB 7.4* 7.8* 7.5* 7.4* 7.4* 7.5*  HCT 23.5* 23.0* 23.5* 23.5* 23.5* 23.8*  MCV 102.2*  --  101.3* 102.2* 102.6* 103.5*  PLT 95*  --  103* 117* 120* 136*    Basic Metabolic Panel: Recent Labs  Lab 02/01/19 0323 02/02/19 0405 02/02/19 0739 02/03/19 0434 02/03/19 1723 02/04/19 0428  NA 135 136  --  134* 135 135  K 4.5 4.9  --  5.9* 5.0 5.8*  CL 98 98  --  98 98 99  CO2 26 24  --  '24 24 24  ' GLUCOSE 100* 109*  --  132* 119* 161*  BUN 60* 80*  --  74* 80* 97*  CREATININE 4.96* 6.01*  --  5.48* 5.68* 6.63*  CALCIUM 8.9 9.1 9.3 8.8* 9.0 8.9  PHOS  --  5.4*  --  5.5* 5.1* 6.7*   GFR: Estimated Creatinine Clearance: 11.8 mL/min (A) (by C-G formula based on SCr of 6.63 mg/dL (H)). Liver Function Tests: Recent Labs  Lab 02/02/19 0405 02/03/19 0434 02/03/19 1723 02/04/19 0428  ALBUMIN 3.6 3.5 3.8 3.6   No results for input(s): LIPASE, AMYLASE in the last 168 hours. No results for input(s): AMMONIA in the last 168 hours. Coagulation Profile: No results for input(s): INR, PROTIME in the last 168 hours. Cardiac Enzymes: No results for input(s): CKTOTAL, CKMB, CKMBINDEX, TROPONINI in the last 168 hours. BNP (last 3 results) No results for input(s): PROBNP in the last 8760 hours. HbA1C: No results for input(s): HGBA1C in the last 72 hours. CBG: Recent Labs  Lab 01/31/19 1002  GLUCAP 85   Lipid Profile: No results for input(s): CHOL, HDL, LDLCALC, TRIG, CHOLHDL, LDLDIRECT in the last 72 hours. Thyroid Function Tests: No results for input(s): TSH, T4TOTAL, FREET4, T3FREE, THYROIDAB in the last 72 hours. Anemia Panel: No results for input(s): VITAMINB12, FOLATE, FERRITIN, TIBC, IRON, RETICCTPCT in the last 72 hours. Sepsis Labs: No results for input(s): PROCALCITON, LATICACIDVEN in the last 168 hours.  No results found for this or any previous visit (from the past 240 hour(s)).     Radiology Studies: Vas [place] Upper Ext Vein Mapping (pre-op Avf)  Result Date: 02/02/2019 UPPER EXTREMITY VEIN MAPPING  Indications: Pre-access. Performing Technologist: June Leap RDMS, RVT  Examination Guidelines: A complete evaluation includes  B-mode imaging, spectral Doppler, color Doppler, and power Doppler as needed of all accessible portions of each vessel. Bilateral testing is considered an integral part of a complete examination. Limited examinations for reoccurring indications may be performed as noted. +-----------------+-------------+----------+---------+  Right Cephalic    Diameter (cm) Depth (cm) Findings   +-----------------+-------------+----------+---------+  Shoulder              0.38         0.75               +-----------------+-------------+----------+---------+  Mid upper arm  0.43         0.40               +-----------------+-------------+----------+---------+  Dist upper arm        0.48         0.68               +-----------------+-------------+----------+---------+  Antecubital fossa     0.53         0.84               +-----------------+-------------+----------+---------+  Prox forearm          0.39         0.36               +-----------------+-------------+----------+---------+  Mid forearm           0.37         0.47    branching  +-----------------+-------------+----------+---------+  Dist forearm          0.35         0.27               +-----------------+-------------+----------+---------+ +-----------------+-------------+----------+---------+  Left Cephalic     Diameter (cm) Depth (cm) Findings   +-----------------+-------------+----------+---------+  Shoulder              0.42         0.71               +-----------------+-------------+----------+---------+  Mid upper arm         0.34         0.62               +-----------------+-------------+----------+---------+  Dist upper arm        0.42         0.40               +-----------------+-------------+----------+---------+  Antecubital fossa     0.37         0.34    branching  +-----------------+-------------+----------+---------+  Prox forearm          0.31         0.21               +-----------------+-------------+----------+---------+  Mid forearm           0.32          0.18               +-----------------+-------------+----------+---------+  Dist forearm          0.34         0.20               +-----------------+-------------+----------+---------+ *See table(s) above for measurements and observations.  Diagnosing physician: Deitra Mayo MD Electronically signed by Deitra Mayo MD on 02/02/2019 at 3:41:59 PM.    Final         Scheduled Meds:  chlorhexidine gluconate (MEDLINE KIT)  15 mL Mouth Rinse BID   Chlorhexidine Gluconate Cloth  6 each Topical Q0600   Chlorhexidine Gluconate Cloth  6 each Topical Q0600   Chlorhexidine Gluconate Cloth  6 each Topical Q0600   darbepoetin (ARANESP) injection - DIALYSIS  200 mcg Intravenous Q Tue-HD   dexamethasone  20 mg Intravenous Q24H   feeding supplement (ENSURE ENLIVE)  237 mL Oral BID BM   heparin  40 Units/kg Dialysis Once in dialysis   heparin  40 Units/kg Dialysis Once in dialysis   mouth rinse  15 mL  Mouth Rinse q12n4p   midodrine  10 mg Oral TID WC   multivitamin  1 tablet Oral QHS   nystatin   Topical TID   pantoprazole (PROTONIX) IV  40 mg Intravenous Q12H   polyethylene glycol  17 g Oral Daily   sevelamer carbonate  2.4 g Oral TID WC   Continuous Infusions:  sodium chloride     sodium chloride     sodium chloride Stopped (02/03/19 1409)   heparin 1,200 Units/hr (02/03/19 1409)     LOS: 21 days     Cordelia Poche, MD [facility] 02/04/2019, 9:55 AM  If 7PM-7AM, please contact night-coverage [email or web address]

## 2019-02-04 NOTE — Procedures (Signed)
I was present at this session.  I have reviewed the session itself and made appropriate changes.  HD via temp cath. tol HD and vol off.  Flows ok.   Jeneen Rinks Bjorn Hallas 6/18/20209:09 AM

## 2019-02-05 ENCOUNTER — Inpatient Hospital Stay (HOSPITAL_COMMUNITY): Payer: Medicare Other | Admitting: Anesthesiology

## 2019-02-05 ENCOUNTER — Inpatient Hospital Stay (HOSPITAL_COMMUNITY): Payer: Medicare Other

## 2019-02-05 ENCOUNTER — Encounter (HOSPITAL_COMMUNITY)
Admission: AD | Disposition: A | Discharge status: Rehabilitation Facility | Payer: Self-pay | Source: Other Acute Inpatient Hospital | Attending: Family Medicine

## 2019-02-05 DIAGNOSIS — N185 Chronic kidney disease, stage 5: Secondary | ICD-10-CM

## 2019-02-05 DIAGNOSIS — I82601 Acute embolism and thrombosis of unspecified veins of right upper extremity: Secondary | ICD-10-CM

## 2019-02-05 HISTORY — PX: AV FISTULA PLACEMENT: SHX1204

## 2019-02-05 HISTORY — PX: INSERTION OF DIALYSIS CATHETER: SHX1324

## 2019-02-05 LAB — RENAL FUNCTION PANEL
Albumin: 3.5 g/dL (ref 3.5–5.0)
Anion gap: 12 (ref 5–15)
BUN: 68 mg/dL — ABNORMAL HIGH (ref 8–23)
CO2: 26 mmol/L (ref 22–32)
Calcium: 8.6 mg/dL — ABNORMAL LOW (ref 8.9–10.3)
Chloride: 99 mmol/L (ref 98–111)
Creatinine, Ser: 5.64 mg/dL — ABNORMAL HIGH (ref 0.61–1.24)
GFR calc Af Amer: 10 mL/min — ABNORMAL LOW (ref >60)
GFR calc non Af Amer: 9 mL/min — ABNORMAL LOW (ref >60)
Glucose, Bld: 134 mg/dL — ABNORMAL HIGH (ref 70–99)
Phosphorus: 5.3 mg/dL — ABNORMAL HIGH (ref 2.5–4.6)
Potassium: 4.9 mmol/L (ref 3.5–5.1)
Sodium: 137 mmol/L (ref 135–145)

## 2019-02-05 LAB — HEPARIN LEVEL (UNFRACTIONATED): Heparin Unfractionated: 0.67 IU/mL (ref 0.30–0.70)

## 2019-02-05 LAB — PROTIME-INR
INR: 1.2 (ref 0.8–1.2)
Prothrombin Time: 15.1 seconds (ref 11.4–15.2)

## 2019-02-05 LAB — CBC
HCT: 23.3 % — ABNORMAL LOW (ref 39.0–52.0)
Hemoglobin: 7.2 g/dL — ABNORMAL LOW (ref 13.0–17.0)
MCH: 32.6 pg (ref 26.0–34.0)
MCHC: 30.9 g/dL (ref 30.0–36.0)
MCV: 105.4 fL — ABNORMAL HIGH (ref 80.0–100.0)
Platelets: 164 10*3/uL (ref 150–400)
RBC: 2.21 MIL/uL — ABNORMAL LOW (ref 4.22–5.81)
RDW: 18.2 % — ABNORMAL HIGH (ref 11.5–15.5)
WBC: 8.7 10*3/uL (ref 4.0–10.5)
nRBC: 1.5 % — ABNORMAL HIGH (ref 0.0–0.2)

## 2019-02-05 SURGERY — INSERTION OF DIALYSIS CATHETER
Anesthesia: Monitor Anesthesia Care | Laterality: Right

## 2019-02-05 MED ORDER — HEPARIN SODIUM (PORCINE) 1000 UNIT/ML IJ SOLN
INTRAMUSCULAR | Status: DC | PRN
  Administered 2019-02-05: 1000 [IU]

## 2019-02-05 MED ORDER — PROTAMINE SULFATE 10 MG/ML IV SOLN
INTRAVENOUS | Status: AC
  Filled 2019-02-05: qty 5

## 2019-02-05 MED ORDER — SODIUM CHLORIDE 0.9 % IV SOLN
INTRAVENOUS | Status: DC | PRN
  Administered 2019-02-05: 07:00:00 via INTRAVENOUS

## 2019-02-05 MED ORDER — ONDANSETRON HCL 4 MG/2ML IJ SOLN
INTRAMUSCULAR | Status: DC | PRN
  Administered 2019-02-05: 4 mg via INTRAVENOUS

## 2019-02-05 MED ORDER — HEPARIN SODIUM (PORCINE) 1000 UNIT/ML IJ SOLN
INTRAMUSCULAR | Status: AC
  Filled 2019-02-05: qty 1

## 2019-02-05 MED ORDER — HYDROCODONE-ACETAMINOPHEN 5-325 MG PO TABS
1.0000 | ORAL_TABLET | ORAL | Status: DC | PRN
  Administered 2019-02-05: 1 via ORAL
  Filled 2019-02-05: qty 1

## 2019-02-05 MED ORDER — SODIUM CHLORIDE 0.9 % IV SOLN
INTRAVENOUS | Status: DC | PRN
  Administered 2019-02-05: 500 mL

## 2019-02-05 MED ORDER — 0.9 % SODIUM CHLORIDE (POUR BTL) OPTIME
TOPICAL | Status: DC | PRN
  Administered 2019-02-05: 1000 mL

## 2019-02-05 MED ORDER — HEPARIN (PORCINE) 25000 UT/250ML-% IV SOLN: 800.0000 [IU]/h | INTRAVENOUS | Status: DC

## 2019-02-05 MED ORDER — PROPOFOL 10 MG/ML IV BOLUS
INTRAVENOUS | Status: DC | PRN
  Administered 2019-02-05: 20 mg via INTRAVENOUS

## 2019-02-05 MED ORDER — LIDOCAINE-EPINEPHRINE (PF) 1 %-1:200000 IJ SOLN
INTRAMUSCULAR | Status: AC
  Filled 2019-02-05: qty 30

## 2019-02-05 MED ORDER — SODIUM CHLORIDE 0.9 % IV SOLN
INTRAVENOUS | Status: AC
  Filled 2019-02-05: qty 1.2

## 2019-02-05 MED ORDER — FENTANYL CITRATE (PF) 100 MCG/2ML IJ SOLN: 25.0000 ug | INTRAMUSCULAR | Status: DC | PRN

## 2019-02-05 MED ORDER — PROPOFOL 10 MG/ML IV BOLUS
INTRAVENOUS | Status: AC
  Filled 2019-02-05: qty 20

## 2019-02-05 MED ORDER — APIXABAN 5 MG PO TABS
10.0000 mg | ORAL_TABLET | Freq: Two times a day (BID) | ORAL | Status: DC
  Administered 2019-02-05 – 2019-02-06 (×2): 10 mg via ORAL
  Filled 2019-02-05 (×2): qty 2

## 2019-02-05 MED ORDER — ACETAMINOPHEN 500 MG PO TABS
1000.0000 mg | ORAL_TABLET | Freq: Once | ORAL | Status: AC
  Administered 2019-02-05: 1000 mg via ORAL
  Filled 2019-02-05: qty 2

## 2019-02-05 MED ORDER — HEPARIN SODIUM (PORCINE) 1000 UNIT/ML DIALYSIS
2000.0000 [IU] | INTRAMUSCULAR | Status: DC | PRN
  Administered 2019-02-06: 2000 [IU] via INTRAVENOUS_CENTRAL

## 2019-02-05 MED ORDER — PROTAMINE SULFATE 10 MG/ML IV SOLN
INTRAVENOUS | Status: DC | PRN
  Administered 2019-02-05: 10 mg via INTRAVENOUS
  Administered 2019-02-05: 40 mg via INTRAVENOUS

## 2019-02-05 MED ORDER — NEPRO/CARBSTEADY PO LIQD
237.0000 mL | Freq: Three times a day (TID) | ORAL | Status: DC
  Administered 2019-02-05 – 2019-02-10 (×11): 237 mL via ORAL
  Filled 2019-02-05 (×17): qty 237

## 2019-02-05 MED ORDER — FENTANYL CITRATE (PF) 100 MCG/2ML IJ SOLN
INTRAMUSCULAR | Status: DC | PRN
  Administered 2019-02-05 (×2): 25 ug via INTRAVENOUS

## 2019-02-05 MED ORDER — LIDOCAINE HCL (PF) 1 % IJ SOLN
INTRAMUSCULAR | Status: DC | PRN
  Administered 2019-02-05: 21 mL

## 2019-02-05 MED ORDER — FENTANYL CITRATE (PF) 250 MCG/5ML IJ SOLN
INTRAMUSCULAR | Status: AC
  Filled 2019-02-05: qty 5

## 2019-02-05 MED ORDER — LIDOCAINE HCL (PF) 1 % IJ SOLN
INTRAMUSCULAR | Status: AC
  Filled 2019-02-05: qty 30

## 2019-02-05 MED ORDER — HEPARIN SODIUM (PORCINE) 1000 UNIT/ML IJ SOLN
INTRAMUSCULAR | Status: DC | PRN
  Administered 2019-02-05: 5000 [IU] via INTRAVENOUS

## 2019-02-05 MED ORDER — PANTOPRAZOLE SODIUM 40 MG PO TBEC
40.0000 mg | DELAYED_RELEASE_TABLET | Freq: Every day | ORAL | Status: DC
  Administered 2019-02-05 – 2019-02-10 (×6): 40 mg via ORAL
  Filled 2019-02-05 (×6): qty 1

## 2019-02-05 MED ORDER — SODIUM CHLORIDE 0.9 % IV SOLN
INTRAVENOUS | Status: DC | PRN
  Administered 2019-02-05: 15 ug/min via INTRAVENOUS

## 2019-02-05 MED ORDER — PROPOFOL 500 MG/50ML IV EMUL
INTRAVENOUS | Status: DC | PRN
  Administered 2019-02-05: 60 ug/kg/min via INTRAVENOUS

## 2019-02-05 SURGICAL SUPPLY — 68 items
BAG DECANTER FOR FLEXI CONT (MISCELLANEOUS) ×3 IMPLANT
BIOPATCH WHT 1IN DISK W/4.0 H (GAUZE/BANDAGES/DRESSINGS) ×1 IMPLANT
CANISTER SUCT 3000ML PPV (MISCELLANEOUS) ×3 IMPLANT
CANNULA VESSEL 3MM 2 BLNT TIP (CANNULA) ×3 IMPLANT
CATH PALINDROME RT-P 15FX19CM (CATHETERS) IMPLANT
CATH PALINDROME RT-P 15FX23CM (CATHETERS) ×1 IMPLANT
CATH PALINDROME RT-P 15FX28CM (CATHETERS) IMPLANT
CATH PALINDROME RT-P 15FX55CM (CATHETERS) IMPLANT
CATH STRAIGHT 5FR 65CM (CATHETERS) IMPLANT
CHLORAPREP W/TINT 26ML (MISCELLANEOUS) ×4 IMPLANT
CLIP VESOCCLUDE MED 6/CT (CLIP) ×3 IMPLANT
CLIP VESOCCLUDE SM WIDE 6/CT (CLIP) ×3 IMPLANT
COVER PROBE W GEL 5X96 (DRAPES) ×2 IMPLANT
COVER SURGICAL LIGHT HANDLE (MISCELLANEOUS) ×3 IMPLANT
COVER WAND RF STERILE (DRAPES) ×2 IMPLANT
DECANTER SPIKE VIAL GLASS SM (MISCELLANEOUS) ×2 IMPLANT
DERMABOND ADVANCED (GAUZE/BANDAGES/DRESSINGS) ×2
DERMABOND ADVANCED .7 DNX12 (GAUZE/BANDAGES/DRESSINGS) IMPLANT
DRAIN PENROSE 1/4X12 LTX STRL (WOUND CARE) ×3 IMPLANT
DRAPE C-ARM 42X72 X-RAY (DRAPES) ×3 IMPLANT
DRAPE CHEST BREAST 15X10 FENES (DRAPES) ×3 IMPLANT
ELECT REM PT RETURN 9FT ADLT (ELECTROSURGICAL) ×3
ELECTRODE REM PT RTRN 9FT ADLT (ELECTROSURGICAL) ×2 IMPLANT
GAUZE 4X4 16PLY RFD (DISPOSABLE) ×3 IMPLANT
GAUZE SPONGE 2X2 8PLY STRL LF (GAUZE/BANDAGES/DRESSINGS) IMPLANT
GAUZE SPONGE 4X4 16PLY XRAY LF (GAUZE/BANDAGES/DRESSINGS) ×1 IMPLANT
GLOVE BIO SURGEON STRL SZ 6.5 (GLOVE) ×3 IMPLANT
GLOVE BIO SURGEON STRL SZ7 (GLOVE) ×1 IMPLANT
GLOVE BIO SURGEON STRL SZ7.5 (GLOVE) ×6 IMPLANT
GLOVE BIOGEL PI IND STRL 6.5 (GLOVE) IMPLANT
GLOVE BIOGEL PI INDICATOR 6.5 (GLOVE) ×2
GLOVE INDICATOR 6.5 STRL GRN (GLOVE) ×1 IMPLANT
GLOVE INDICATOR 7.0 STRL GRN (GLOVE) ×1 IMPLANT
GOWN STRL REUS W/ TWL LRG LVL3 (GOWN DISPOSABLE) ×6 IMPLANT
GOWN STRL REUS W/ TWL XL LVL3 (GOWN DISPOSABLE) IMPLANT
GOWN STRL REUS W/TWL LRG LVL3 (GOWN DISPOSABLE) ×5
GOWN STRL REUS W/TWL XL LVL3 (GOWN DISPOSABLE) ×1
KIT BASIN OR (CUSTOM PROCEDURE TRAY) ×3 IMPLANT
KIT TURNOVER KIT B (KITS) ×3 IMPLANT
LOOP VESSEL MINI RED (MISCELLANEOUS) IMPLANT
NDL 18GX1X1/2 (RX/OR ONLY) (NEEDLE) ×2 IMPLANT
NDL HYPO 25GX1X1/2 BEV (NEEDLE) ×2 IMPLANT
NEEDLE 18GX1X1/2 (RX/OR ONLY) (NEEDLE) ×3 IMPLANT
NEEDLE HYPO 25GX1X1/2 BEV (NEEDLE) ×3 IMPLANT
NS IRRIG 1000ML POUR BTL (IV SOLUTION) ×3 IMPLANT
PACK CV ACCESS (CUSTOM PROCEDURE TRAY) ×3 IMPLANT
PACK SURGICAL SETUP 50X90 (CUSTOM PROCEDURE TRAY) ×3 IMPLANT
PAD ARMBOARD 7.5X6 YLW CONV (MISCELLANEOUS) ×6 IMPLANT
SET MICROPUNCTURE 5F STIFF (MISCELLANEOUS) IMPLANT
SPONGE GAUZE 2X2 STER 10/PKG (GAUZE/BANDAGES/DRESSINGS) ×2
SPONGE SURGIFOAM ABS GEL 100 (HEMOSTASIS) IMPLANT
SUT ETHILON 3 0 PS 1 (SUTURE) ×3 IMPLANT
SUT PROLENE 6 0 BV (SUTURE) ×4 IMPLANT
SUT PROLENE 7 0 BV 1 (SUTURE) ×3 IMPLANT
SUT VIC AB 3-0 SH 27 (SUTURE) ×3
SUT VIC AB 3-0 SH 27X BRD (SUTURE) ×2 IMPLANT
SUT VIC AB 4-0 PS2 27 (SUTURE) ×2 IMPLANT
SUT VICRYL 4-0 PS2 18IN ABS (SUTURE) ×3 IMPLANT
SYR 10ML LL (SYRINGE) ×3 IMPLANT
SYR 20CC LL (SYRINGE) ×6 IMPLANT
SYR 5ML LL (SYRINGE) ×3 IMPLANT
SYR CONTROL 10ML LL (SYRINGE) ×3 IMPLANT
TAPE CLOTH SURG 4X10 WHT LF (GAUZE/BANDAGES/DRESSINGS) ×2 IMPLANT
TOWEL GREEN STERILE (TOWEL DISPOSABLE) ×6 IMPLANT
TOWEL GREEN STERILE FF (TOWEL DISPOSABLE) ×3 IMPLANT
UNDERPAD 30X30 (UNDERPADS AND DIAPERS) ×3 IMPLANT
WATER STERILE IRR 1000ML POUR (IV SOLUTION) ×3 IMPLANT
WIRE AMPLATZ SS-J .035X180CM (WIRE) IMPLANT

## 2019-02-05 NOTE — Anesthesia Procedure Notes (Signed)
Procedure Name: MAC Date/Time: 02/05/2019 7:36 AM Performed by: Leonor Liv, CRNA Pre-anesthesia Checklist: Patient identified, Emergency Drugs available, Suction available, Patient being monitored and Timeout performed Oxygen Delivery Method: Simple face mask Placement Confirmation: positive ETCO2 Dental Injury: Teeth and Oropharynx as per pre-operative assessment

## 2019-02-05 NOTE — Plan of Care (Signed)
°  Problem: Health Behavior/Discharge Planning: °Goal: Ability to manage health-related needs will improve °Outcome: Progressing °  °Problem: Skin Integrity: °Goal: Risk for impaired skin integrity will decrease °Outcome: Progressing °  °

## 2019-02-05 NOTE — Progress Notes (Signed)
Inpatient Rehabilitation-Admissions Coordinator   Pt and family would like to pursue CIR. AC will begin insurance authorization process for possible admit next week.   Please call if questions.   Jhonnie Garner, OTR/L  Rehab Admissions Coordinator  [phone] 02/05/2019 2:34 PM

## 2019-02-05 NOTE — Interval H&P Note (Signed)
History and Physical Interval Note:  02/05/2019 7:25 AM  Scott Mcdowell  has presented today for surgery, with the diagnosis of end stage renal disease.  The various methods of treatment have been discussed with the patient and family. After consideration of risks, benefits and other options for treatment, the patient has consented to  Procedure(s): ARTERIOVENOUS (AV) FISTULA CREATION LEFT ARM (Left) INSERTION OF DIALYSIS CATHETER (N/A) as a surgical intervention.  The patient's history has been reviewed, patient examined, no change in status, stable for surgery.  I have reviewed the patient's chart and labs.  Questions were answered to the patient's satisfaction.     [name]

## 2019-02-05 NOTE — Progress Notes (Signed)
Patient had a permanent HD catheter placed today per Idamae Schuller, Therapist, sports. Patient from PACU with fluids running through venous port. Order for heparin drip. Per Dr. Hollie Salk with nephrology, do not use venous port for heparin gtt or lab draws. Ren, RN to follow-up for access with primary team. Patient right arm restricted per RN due to stroke and DVT. Venous port disconnected from fluids and heparin instilled.

## 2019-02-05 NOTE — Transfer of Care (Signed)
Immediate Anesthesia Transfer of Care Note  Patient: Scott Mcdowell  Procedure(s) Performed: ARTERIOVENOUS FISTULA CREATION LEFT ARM (Left ) INSERTION OF DIALYSIS CATHETER RIGHT INTERNAL JUGULAR (Right )  Patient Location: PACU  Anesthesia Type:MAC  Level of Consciousness: sedated  Airway & Oxygen Therapy: Patient Spontanous Breathing and Patient connected to face mask oxygen  Post-op Assessment: Report given to RN and Post -op Vital signs reviewed and stable  Post vital signs: Reviewed and stable  Last Vitals:  Vitals Value Taken Time  BP 140/65 02/05/19 1104  Temp 36.7 C 02/05/19 1018  Pulse 80 02/05/19 1113  Resp 11 02/05/19 1113  SpO2 93 % 02/05/19 1113  Vitals shown include unvalidated device data.  Last Pain:  Vitals:   02/05/19 1049  TempSrc:   PainSc: Asleep      Patients Stated Pain Goal: 0 (52/08/02 2336)  Complications: No apparent anesthesia complications

## 2019-02-05 NOTE — Progress Notes (Signed)
ANTICOAGULATION CONSULT NOTE  Pharmacy Consult for heparin Indication: DVT  No Known Allergies  Patient Measurements: Height: 6\' 2"  (188 cm) Weight: 250 lb 3.6 oz (113.5 kg) IBW/kg (Calculated) : 82.2 Heparin Dosing Weight: 104 kg  Vital Signs: Temp: 98 F (36.7 C) (06/19 1145) Temp Source: Oral (06/19 0507) BP: 136/10 (06/19 1145) Pulse Rate: 74 (06/19 1145)  Labs: Recent Labs    02/03/19 0434 02/03/19 1723 02/03/19 2327 02/04/19 0428 02/05/19 0125  HGB 7.4*  --   --  7.5*  --   HCT 23.5*  --   --  23.8*  --   PLT 120*  --   --  136*  --   HEPARINUNFRC  --   --  0.45 0.63 0.67  CREATININE 5.48* 5.68*  --  6.63*  --     Estimated Creatinine Clearance: 11.9 mL/min (A) (by C-G formula based on SCr of 6.63 mg/dL (H)).  Assessment: 78 yo male with RUE DVT to continue on IV heparin. Heparin was off for a procedure but now to resume at 1400 today.   Goal of Therapy:  Heparin level 0.3-0.5 units/mL d/t GIB Monitor platelets by anticoagulation protocol: Yes   Plan:  Restart heparin gtt at 800 units/hr at 2pm Check 8 hr heparin level  Salome Arnt, PharmD, BCPS Please see AMION for all pharmacy numbers 02/05/2019 12:25 PM

## 2019-02-05 NOTE — Progress Notes (Signed)
ANTICOAGULATION CONSULT NOTE  Pharmacy Consult for heparin to apixaban Indication: DVT  No Known Allergies  Patient Measurements: Height: 6\' 2"  (188 cm) Weight: 250 lb 3.6 oz (113.5 kg) IBW/kg (Calculated) : 82.2 Heparin Dosing Weight: 104 kg  Vital Signs: Temp: 98.2 F (36.8 C) (06/19 1640) Temp Source: Oral (06/19 1640) BP: 114/55 (06/19 1640) Pulse Rate: 83 (06/19 1640)  Labs: Recent Labs    02/03/19 0434 02/03/19 1723 02/03/19 2327 02/04/19 0428 02/05/19 0125  HGB 7.4*  --   --  7.5*  --   HCT 23.5*  --   --  23.8*  --   PLT 120*  --   --  136*  --   HEPARINUNFRC  --   --  0.45 0.63 0.67  CREATININE 5.48* 5.68*  --  6.63*  --     Estimated Creatinine Clearance: 11.9 mL/min (A) (by C-G formula based on SCr of 6.63 mg/dL (H)).  Assessment: 78 yo male with RUE DVT to transition from heparin to apixaban  Goal of Therapy:  Monitor platelets by anticoagulation protocol: Yes   Plan:  DC heparin and heparin labs Eliquis 10 mg po BID x 1 week then 5 mg po BID  Thank you Anette Guarneri, PharmD  Please see AMION for all pharmacy numbers 02/05/2019 6:55 PM

## 2019-02-05 NOTE — Progress Notes (Signed)
Per Dagoberto Ligas, PA restart active order of Heparin at 2pm.

## 2019-02-05 NOTE — Anesthesia Postprocedure Evaluation (Signed)
Anesthesia Post Note  Patient: FRANKLIN BAUMBACH  Procedure(s) Performed: ARTERIOVENOUS FISTULA CREATION LEFT ARM (Left ) INSERTION OF DIALYSIS CATHETER RIGHT INTERNAL JUGULAR (Right )     Patient location during evaluation: PACU Anesthesia Type: MAC Level of consciousness: awake and alert Pain management: pain level controlled Vital Signs Assessment: post-procedure vital signs reviewed and stable Respiratory status: spontaneous breathing, nonlabored ventilation, respiratory function stable and patient connected to nasal cannula oxygen Cardiovascular status: stable and blood pressure returned to baseline Postop Assessment: no apparent nausea or vomiting Anesthetic complications: no    Last Vitals:  Vitals:   02/05/19 1049 02/05/19 1100  BP: 138/61 140/65  Pulse: 82 84  Resp: 12 12  Temp:    SpO2: 98% 98%    Last Pain:  Vitals:   02/05/19 1049  TempSrc:   PainSc: Asleep                 Laparis Durrett,W. EDMOND

## 2019-02-05 NOTE — Progress Notes (Addendum)
PROGRESS NOTE    Scott Mcdowell  QIO:[id number] DOB: 08-Nov-1940 DOA: 01/14/2019 PCP: [name], MD   Brief Narrative: Scott Mcdowell is a 78 y.o. male presenting with edema. Patient admitted with pneumonia and his hospital course has been complicated by AKI requiring renal replacement therapy, circulatory shock requiring vasopressors, respiratory failure requiring brief intubation, acute blood loss anemia from bleeding upper GI sources requiring EGD intervention and right upper extremity DVT requiring anticoagulation prior to being now complicated by upper GI bleeding.    Assessment & Plan:   Principal Problem:   Shock ([place]) Active Problems:   ARF (acute renal failure) (HCC)   Hyperkalemia   B12 deficiency anemia   Pleural effusion on right   Ascites   CAP (community acquired pneumonia)   Elevated troponin   Acute blood loss anemia   Upper GI bleed   Goals of care, counseling/discussion   Lambda light chain myeloma (HCC)   Pressure injury of skin   ESRD Hyperkalemia Initially managed with diuresis with poor effect. While in ICU, he was started on CRRT. Deemed ESRD. No urine output documented over last 24 hours. Dialysis catheter and left brachial cephalic AV fistula placed today by IR. -Nephrology recommendations: HD -Strict urine output  Hypotension/shock Patient required the use of vasopressors while in the ICU. Concern for relative adrenal insufficiency in the ICU and has been started on hydrocortisone in addition to midodrine. Blood pressure still soft and acutely worsened in setting of worsening anemia and recurrent melena. Hydrocortisone discontinued. -Continue midodrine  Anemia Thrombocytopenia Acute blood loss anemia Upper GI bleeding Hemoglobin stable. Recent EGD performed on 6/4 and significant for multiple areas of bleeding which were treated with clip and epi. Iron studies normal except a mildly elevated ferritin. GI signed off and  recommending to restart anticoagulation as indicated. Unfortunately, evidence he is possibly re-bleeding. To date, he has had 8 units of PRBC, 1 units of platelets and 3 units of FFP. Repeat EGD (6/14) without source of active bleeding. Hemoglobin stable. -Daily CBC  Right upper extremity DVT Treatment complicated by anemia requiring multiple transfusions, in addition to epistaxis and hemoptysis. Hemoglobin stable currently. -Will likely need to start Coumadin in light of renal disease, however, still high risk for bleeding. Hematology recommending Eliquis -Continue Heparin drip for now since he will be going for procedures  Tachypnea Patient with a recent right upper extremity DVT, not previously on anticoagulation secondary to recent active GI bleeding requiring endoscopic management. Now heparin drip. Also with recent pneumonia. Improved.  Pneumonia Associated right para-pneumonia effusion. Completed antibiotic course. Also underwent thoracentesis. 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  History of CVA Associated right sided deficits. Per patient, he was able to ambulate pretty well prior to current illness. PT/OT recommending CIR. CIR recommends consideration when patient is closer to discharge/oncology recs. Continues to work with PT.  Pressure injuries Sacrum, left heel, right heel, POA  Cirrhosis Seen on CT. Will need outpatient follow-up.  Obesity Body mass index is 32.13 kg/m.   DVT prophylaxis: SCDs, Heparin drip Code Status:   Code Status: Full Code Family Communication: None Disposition Plan: Possible CIR if able vs SNF pending management of kidney failure   Consultants:   PCCM  Nephrology  [facility]  Procedures:    6/4: EGD Impression:               -  Normal esophagus.                           - Adherent clot in the distal gastric body, unclear                            if NG tube trauma but treated with hemostasis clip                            and epi. Friable mucosa in the proximal stomach                            with contact oozing.                           - Red blood with active bleeding and clots in the                            duodenal bulb. 4 discrete areas of adherent clot                            with extremely friable tissue but no focal                            ulceration appreciated. One of these areas had a                            visible vessel which was bleeding, another lesion                            had active oozing. 2 areas treated as outlined                            above. 4 clips placed total in the duodenal bulb.                            Patient with lytic lesions noted on xray, unclear                            if he has underlying myeloma? These lesions appear                            to have high risk for relbeeding in the setting of                            coagulopathy. No active bleeding noted following                            endoscopic intervention.  Recommendation:           - Patient to remain in the ICU for ongoing care.                           -  NPO.                           - Continue present medications including IV PPI                           - Serial Hgb                           - Further workup pending course. If he survives                            these acute issues, may need repeat EGD to                            re-evaluate duodenal bulb following treatment with                            PPI and workup underlying liver disease   6/14: EGD Impression:               - Normal appearing, widely patent esophagus and GEJ.                           - Moderate patchy gastritis.                           - Small  hiatal hernia.                           - Erythematous duodenopathy.                           - No specimens collected. Recommendation:           - Full liquid diet today.                           - Continue present medications.                           - Serial CBC's.  Antimicrobials:  Azithro 5/29 >> 5/31   Vanco 5/29 >> 5/30  Cefepime 5/30 >> 6/4    Subjective: No issues this morning  Objective: Vitals:   02/05/19 1130 02/05/19 1145 02/05/19 1230 02/05/19 1330  BP: 140/69 (!) 136/10 135/67 140/62  Pulse: 77 74 76 86  Resp: '12 12 12   ' Temp:  98 F (36.7 C)    TempSrc:      SpO2: 100% 99%    Weight:      Height:        Intake/Output Summary (Last 24 hours) at 02/05/2019 1411 Last data filed at 02/05/2019 1200 Gross per 24 hour  Intake 1447.5 ml  Output 10 ml  Net 1437.5 ml   Filed Weights   02/04/19 0735 02/04/19 1157 02/04/19 2043  Weight: 110.7 kg 108.1 kg 113.5 kg    Examination:  General exam: Appears calm and comfortable Respiratory system: Clear to auscultation. Respiratory effort normal. Cardiovascular system: S1 & S2 heard, RRR. 2/6 systolic murmur Gastrointestinal system: Abdomen is nondistended, soft and  nontender. No organomegaly or masses felt. Normal bowel sounds heard. Central nervous system: Alert and oriented. No focal neurological deficits. Extremities: Edema of right arm. No calf tenderness Skin: No cyanosis. No rashes Psychiatry: Judgement and insight appear normal. Mood & affect appropriate.  Data Reviewed: I have personally reviewed following labs and imaging studies  CBC: Recent Labs  Lab 01/30/19 0308 01/30/19 1203 01/31/19 0509 02/02/19 0405 02/03/19 0434 02/04/19 0428  WBC 7.6  --  5.5 6.0 5.1 6.6  HGB 7.4* 7.8* 7.5* 7.4* 7.4* 7.5*  HCT 23.5* 23.0* 23.5* 23.5* 23.5* 23.8*  MCV 102.2*  --  101.3* 102.2* 102.6* 103.5*  PLT 95*  --  103* 117* 120* 213*   Basic Metabolic Panel: Recent Labs  Lab 02/01/19 0323 02/02/19  0405 02/02/19 0739 02/03/19 0434 02/03/19 1723 02/04/19 0428  NA 135 136  --  134* 135 135  K 4.5 4.9  --  5.9* 5.0 5.8*  CL 98 98  --  98 98 99  CO2 26 24  --  '24 24 24  ' GLUCOSE 100* 109*  --  132* 119* 161*  BUN 60* 80*  --  74* 80* 97*  CREATININE 4.96* 6.01*  --  5.48* 5.68* 6.63*  CALCIUM 8.9 9.1 9.3 8.8* 9.0 8.9  PHOS  --  5.4*  --  5.5* 5.1* 6.7*   GFR: Estimated Creatinine Clearance: 11.9 mL/min (A) (by C-G formula based on SCr of 6.63 mg/dL (H)). Liver Function Tests: Recent Labs  Lab 02/02/19 0405 02/03/19 0434 02/03/19 1723 02/04/19 0428  ALBUMIN 3.6 3.5 3.8 3.6   No results for input(s): LIPASE, AMYLASE in the last 168 hours. No results for input(s): AMMONIA in the last 168 hours. Coagulation Profile: No results for input(s): INR, PROTIME in the last 168 hours. Cardiac Enzymes: No results for input(s): CKTOTAL, CKMB, CKMBINDEX, TROPONINI in the last 168 hours. BNP (last 3 results) No results for input(s): PROBNP in the last 8760 hours. HbA1C: No results for input(s): HGBA1C in the last 72 hours. CBG: Recent Labs  Lab 01/31/19 1002  GLUCAP 85   Lipid Profile: No results for input(s): CHOL, HDL, LDLCALC, TRIG, CHOLHDL, LDLDIRECT in the last 72 hours. Thyroid Function Tests: No results for input(s): TSH, T4TOTAL, FREET4, T3FREE, THYROIDAB in the last 72 hours. Anemia Panel: No results for input(s): VITAMINB12, FOLATE, FERRITIN, TIBC, IRON, RETICCTPCT in the last 72 hours. Sepsis Labs: No results for input(s): PROCALCITON, LATICACIDVEN in the last 168 hours.  No results found for this or any previous visit (from the past 240 hour(s)).     Radiology Studies: Dg Chest Port 1 View  Result Date: 02/05/2019 CLINICAL DATA:  Dialysis. EXAM: PORTABLE CHEST 1 VIEW COMPARISON:  02/04/2019. FINDINGS: Dialysis catheter noted with tip projected over the right atrium. Cardiomegaly with pulmonary venous congestion and bilateral interstitial prominence. Right-sided  pleural effusion. Findings consistent with CHF. Pneumonitis cannot be excluded. Lytic lesion noted the right posterior fifth rib and possibly the right posterolateral seventh rib. A process such as myeloma or metastatic disease could present in this fashion. IMPRESSION: 1.  Dialysis catheter noted with tip projected over right atrium. 2. Cardiomegaly with pulmonary venous congestion bilateral interstitial prominence and right-sided pleural effusion. Findings most consistent with CHF. 3. Lytic lesion noted in the right posterior fifth rib and possibly the right posterolateral seventh rib. A process such as myeloma or metastatic disease could present in this fashion. Electronically Signed   By: Marcello Moores  Register   On: 02/05/2019 12:33  Dg Chest Port 1 View  Result Date: 02/04/2019 CLINICAL DATA:  Post dialysis EXAM: PORTABLE CHEST 1 VIEW COMPARISON:  01/26/2019 FINDINGS: Heart is enlarged. Right jugular central venous catheter tip proximal right atrium unchanged. Left jugular catheter tip mid SVC unchanged Pulmonary vascular congestion and mild edema shows mild interval improvement. Bibasilar atelectasis right greater than left unchanged. Small right pleural effusion unchanged. IMPRESSION: Mild interval improvement in bilateral airspace disease most likely edema. Bibasilar atelectasis right greater than left and small right effusion unchanged. Electronically Signed   By: Franchot Gallo M.D.   On: 02/04/2019 15:43   Dg Fluoro Guide Cv Line-no Report  Result Date: 02/05/2019 Fluoroscopy was utilized by the requesting physician.  No radiographic interpretation.        Scheduled Meds: . chlorhexidine gluconate (MEDLINE KIT)  15 mL Mouth Rinse BID  . Chlorhexidine Gluconate Cloth  6 each Topical Q0600  . Chlorhexidine Gluconate Cloth  6 each Topical Q0600  . Chlorhexidine Gluconate Cloth  6 each Topical Q0600  . darbepoetin (ARANESP) injection - DIALYSIS  200 mcg Intravenous Q Tue-HD  . dexamethasone   20 mg Intravenous Q24H  . feeding supplement (ENSURE ENLIVE)  237 mL Oral BID BM  . mouth rinse  15 mL Mouth Rinse q12n4p  . midodrine  10 mg Oral TID WC  . multivitamin  1 tablet Oral QHS  . nystatin   Topical TID  . pantoprazole  40 mg Oral Daily  . polyethylene glycol  17 g Oral Daily  . sevelamer carbonate  2.4 g Oral TID WC   Continuous Infusions: . sodium chloride Stopped (02/03/19 1409)  . heparin       LOS: 22 days     Cordelia Poche, MD [facility] 02/05/2019, 2:11 PM  If 7PM-7AM, please contact night-coverage [email or web address]

## 2019-02-05 NOTE — Progress Notes (Signed)
ANTICOAGULATION CONSULT NOTE  Pharmacy Consult for heparin Indication: DVT  No Known Allergies  Patient Measurements: Height: 6\' 2"  (188 cm) Weight: 250 lb 3.6 oz (113.5 kg) IBW/kg (Calculated) : 82.2 Heparin Dosing Weight: 104 kg  Vital Signs: Temp: 98.1 F (36.7 C) (06/18 2043) Temp Source: Oral (06/18 2043) BP: 100/60 (06/18 2043) Pulse Rate: 88 (06/18 2043)  Labs: Recent Labs    02/02/19 0405 02/03/19 0434 02/03/19 1723 02/03/19 2327 02/04/19 0428 02/05/19 0125  HGB 7.4* 7.4*  --   --  7.5*  --   HCT 23.5* 23.5*  --   --  23.8*  --   PLT 117* 120*  --   --  136*  --   HEPARINUNFRC  --   --   --  0.45 0.63 0.67  CREATININE 6.01* 5.48* 5.68*  --  6.63*  --     Estimated Creatinine Clearance: 11.9 mL/min (A) (by C-G formula based on SCr of 6.63 mg/dL (H)).  Assessment: 78 yo male with RUE DVT to continue on IV heparin.  Heparin level is supra-therapeutic and trended up.  Confirmed with RN that lab was drawn appropriately.  No bleeding reported.  Goal of Therapy:  Heparin level 0.3-0.5 units/mL d/t GIB Monitor platelets by anticoagulation protocol: Yes   Plan:  Reduce heparin gtt to 800 units/hr Check 8 hr heparin level  Hildagard Sobecki D. Mina Marble, PharmD, BCPS, [place] 02/05/2019, 1:59 AM

## 2019-02-05 NOTE — Progress Notes (Signed)
Nutrition Follow-up  DOCUMENTATION CODES:   Not applicable  INTERVENTION:   -D/C Ensure Enlive    Nepro Shake po TID, each supplement provides 425 kcal and 19 grams protein  Continue Renal MVI  NUTRITION DIAGNOSIS:   Increased nutrient needs related to acute illness, other (see comment)(CRRT) as evidenced by estimated needs.  Ongoing  GOAL:   Patient will meet greater than or equal to 90% of their needs  Meeting PO  MONITOR:   PO intake, Supplement acceptance, Labs, Weight trends, I & O's  REASON FOR ASSESSMENT:   Ventilator    ASSESSMENT:   78 year old male who presented with worsening edema. He has a significant past medical history of CVA. Patient reported dyspnea for several weeks, no fever, chest pain or palpitations. He has LLE and scrotal edema. He was admitted for RLL PNA complicated by parapneumonic pleural effusion and AKI.    6/5- extubated 6/19- L AVF placed   Spoke with pt at bedside. Reports appetite is great this admission. Meal completions charted as 80-100% since admit. Pt compliant with Ensure BID. States MD told him he will likely require a phosphorus binder at home.   Provided Renal Pyramid handout. Explained why diet restrictions are recommended and provided lists of foods to limit/avoid that are high potassium, sodium, and phosphorus. Discussed importance of protein intake at each meal and snack. Provided examples of how to maximize protein intake throughout the day. Discussed need for fluid restriction with dialysis and importance of minimizing weight gain between HD treatments. Encouraged pt to discuss specific diet questions/concerns with RD at HD outpatient facility. Pt is of advanced age but wants to follow diet strictly.   EDW unknown. Weight up from 109.5 kg on 6/16 to 113.5 kg today.   I/O: -132 ml since 6/5 UOP: none documented x 24 hrs   Medications: aranesp, decadron, midodrine, rena-vit, miralax, renvela Labs: K 5.8 Phosphorus  6.7- trending up   Diet Order:   Diet Order            Diet renal with fluid restriction Fluid restriction: 1200 mL Fluid; Room service appropriate? Yes; Fluid consistency: Thin  Diet effective now              EDUCATION NEEDS:   Not appropriate for education at this time  Skin:  Skin Assessment: Skin Integrity Issues: Skin Integrity Issues:: Stage I, Stage II Stage I: sacrum, L heel, R heel Stage II: buttocks  Last BM:  6/17  Height:   Ht Readings from Last 1 Encounters:  01/30/19 6\' 2"  (1.88 m)    Weight:   Wt Readings from Last 1 Encounters:  02/04/19 113.5 kg    Ideal Body Weight:  86.4 kg  BMI:  Body mass index is 32.13 kg/m.  Estimated Nutritional Needs:   Kcal:  2400-2600 kcal  Protein:  125-140 grams  Fluid:  1000 ml + UOP   Mariana Single RD, LDN Clinical Nutrition Pager # [phone]

## 2019-02-05 NOTE — Progress Notes (Signed)
Right TLC removed per order. Pressure to site. No active bleeding noted. Site covered with vaseline gauze and sterile 2x2 gauze, secured with medipore tape. RN instructed patient to remain in bed x 30 minutes. Voiced understanding.

## 2019-02-05 NOTE — Progress Notes (Signed)
OT Cancellation Note  Patient Details Name: ELIAM SNAPP MRN: [id number] DOB: 12-Feb-1941   Cancelled Treatment:    Reason Eval/Treat Not Completed: Patient at procedure or test/ unavailable.  Janice Coffin, COTA/L 02/05/2019, 11:58 AM

## 2019-02-05 NOTE — Progress Notes (Signed)
Scott Mcdowell is to go down for what I suspect is a hemodialysis catheter.  We have on single agent Decadron for the light chain myeloma.  His overall performance status just is not that great and I am not sure that chemotherapy would be in his best interest for right now.  I really would like to see his overall status improve before chemotherapy would be initiated.  It would be very difficult to do chemotherapy as an inpatient.  There is been no obvious GI bleeding.  He has the thrombus in the right arm.  At some point, we will have to think about getting him on anticoagulation.  The only oral anticoagulation protocol that I can think would be appropriate for him would be Eliquis.  His hemoglobin is only 7.5.  This is holding stable.  Platelet count 136.  White cell count 6.6.  All this is relatively stable.  He definitely needs dialysis soon.  His BUN is 97 with a creatinine 6.63.  Again, even if the light chain myeloma is treated and treated successfully, he will still end up with renal failure.  He will be on dialysis the rest of his life.  I am not sure what the plans are for his rehabilitation.  I am not sure if he is going to CIR.  It is hard to say how much he is really eating.  There is no obvious nausea or vomiting.  Overall, Scott Mcdowell just does not have a great performance status.  Maybe, dialysis will help this.  Maybe, physical therapy and rehab might help.  I really think this is going to dictate how aggressive we can be with respect to his myeloma therapy.  I know that the staff up on [facility] are doing a great job with him.  I know this is complicated and several medical issues are active.  Lattie Haw, MD  Romans 5:3-5

## 2019-02-05 NOTE — Progress Notes (Signed)
Followed up with Anda Kraft. On call MD notified, updated on concerns. Per Rosalita Chessman, MD stated to remove central line as ordered.

## 2019-02-05 NOTE — Progress Notes (Signed)
Writer arrived to floor to d/c central line per MD order. Patient has heparin gtt infusing through central line. Patient with poor vasculature. VAST draws patient labs from HD pigtail per MD order including heparin levels. If TLC is dc'd, heparin would be infusing through HD pigtail and VAST unable to draw labs. Expressed concerns to primary care RN Rosalita Chessman. Requested Rosalita Chessman page MD for clarity to ensure removing line was in the best interest of patient. Awaiting MD response for next steps.

## 2019-02-05 NOTE — Op Note (Signed)
Procedure: Ultrasound-guided insertion of Palindrome catheter 23 cm, left brachial cephalic AV fistula  Preoperative diagnosis: End-stage renal disease  Postoperative diagnosis: Same  Anesthesia: Local with IV sedation  Operative findings: 23 cm Palindrome catheter right internal jugular vein  Operative details: After obtaining informed consent, the patient was taken to the operating room. The patient was placed in supine position on the operating room table. After adequate sedation the patient's entire neck and chest were prepped and draped in usual sterile fashion. The patient was placed in Trendelenburg position. Ultrasound was used to identify the patient's right internal jugular vein. This had normal compressibility and respiratory variation. Local anesthesia was infiltrated over the right jugular vein.  Using ultrasound guidance, the right internal jugular vein was successfully cannulated.  A 0.035 J-tipped guidewire was threaded into the right internal jugular vein and into the superior vena cava followed by the inferior vena cava under fluoroscopic guidance.   Next sequential 12 and 14 dilators were placed over the guidewire into the right atrium.  A 16 French dilator with a peel-away sheath was then placed over the guidewire into the right atrium.   The guidewire and dilator were removed. A 23 cm Palindrome catheter was then placed through the peel away sheath into the right atrium.  The catheter was then tunneled subcutaneously, cut to length, and the hub attached. The catheter was noted to flush and draw easily. The catheter was inspected under fluoroscopy and found with its tip to be in the right atrium without any kinks throughout its course. The catheter was sutured to the skin with nylon sutures. The neck insertion site was closed with Vicryl stitch. The catheter was then loaded with concentrated Heparin solution. A dry sterile dressing was applied.  The previously placed temporary catheter  was then removed from the base of the right neck and hemostasis obtained with direct pressure.  The insertion site was closed with a Vicryl stitch.  Next, the patient and her left upper extremities prepped and draped in usual sterile fashion.  Local anesthesia was infiltrated over the cephalic vein.  Ultrasound was used to identify the entire course of the cephalic vein.  This was patent and had normal compressibility.  Due to the lateral location of the cephalic vein I made a longitudinal incision directly over the cephalic vein after infiltrating local anesthesia.  Incision was carried down through the subcutaneous tissues down the level of the vein.  The vein was fairly uniform diameter about 2-1/2 mm.  There was dissected free circumferentially and small side branches ligated and divided tween silk ties. Additional longitudinal incision was made on the medial aspect of the left arm over the brachial artery.  This was done after infiltration of local anesthesia.  Incision was carried down through subcutaneous tissues down the level of brachial artery.  Brachial artery was about 4 mm in diameter.  Had a good pulse within it.  It was dissected free circumferentially and Vesseloops placed around it.  Patient was given 5000 inch of intravenous heparin.  Vesseloops were used to control the artery brought proximally and distally.  The vein was transected and brought under the subcutaneous tunnel skin bridge between the 2 incisions.  It was gently distended with heparinized saline.  Longitudinal opening was made in the brachial artery and the vein sewn end of vein to side of artery using a running 7-0 Prolene suture.  Just prior to completion anastomosis it was for blood backbled and thoroughly flushed reanastomosed was secured clamps  released there was slightly pulsatile  flow in the fistula to the mid upper arm.  Patient was given 50 mg of protamine.  Stasis was obtained.  The subcutaneous tissues of both incisions  was then reapproximated with a running 3-0 Vicryl suture.  The skin of both incisions closed with a 4-0 Vicryl subcuticular stitch.    The patient tolerated procedure well and there were no complications. Instrument sponge and needle counts correct in the case. The patient was taken to the recovery room in stable condition. Chest x-ray will be obtained in the recovery room.  [name], MD [facility] Office: [phone] Pager: [phone]

## 2019-02-06 ENCOUNTER — Encounter (HOSPITAL_COMMUNITY): Payer: Self-pay | Admitting: Vascular Surgery

## 2019-02-06 LAB — CBC
HCT: 23.2 % — ABNORMAL LOW (ref 39.0–52.0)
Hemoglobin: 7.2 g/dL — ABNORMAL LOW (ref 13.0–17.0)
MCH: 32.7 pg (ref 26.0–34.0)
MCHC: 31 g/dL (ref 30.0–36.0)
MCV: 105.5 fL — ABNORMAL HIGH (ref 80.0–100.0)
Platelets: 169 10*3/uL (ref 150–400)
RBC: 2.2 MIL/uL — ABNORMAL LOW (ref 4.22–5.81)
RDW: 18.3 % — ABNORMAL HIGH (ref 11.5–15.5)
WBC: 8.1 10*3/uL (ref 4.0–10.5)
nRBC: 2.6 % — ABNORMAL HIGH (ref 0.0–0.2)

## 2019-02-06 LAB — RENAL FUNCTION PANEL
Albumin: 3.5 g/dL (ref 3.5–5.0)
Anion gap: 15 (ref 5–15)
BUN: 79 mg/dL — ABNORMAL HIGH (ref 8–23)
CO2: 23 mmol/L (ref 22–32)
Calcium: 8.8 mg/dL — ABNORMAL LOW (ref 8.9–10.3)
Chloride: 99 mmol/L (ref 98–111)
Creatinine, Ser: 6.5 mg/dL — ABNORMAL HIGH (ref 0.61–1.24)
GFR calc Af Amer: 9 mL/min — ABNORMAL LOW (ref >60)
GFR calc non Af Amer: 7 mL/min — ABNORMAL LOW (ref >60)
Glucose, Bld: 152 mg/dL — ABNORMAL HIGH (ref 70–99)
Phosphorus: 4.5 mg/dL (ref 2.5–4.6)
Potassium: 5 mmol/L (ref 3.5–5.1)
Sodium: 137 mmol/L (ref 135–145)

## 2019-02-06 MED ORDER — ALTEPLASE 2 MG IJ SOLR: 2.0000 mg | Freq: Once | INTRAMUSCULAR | Status: DC | PRN

## 2019-02-06 MED ORDER — SODIUM CHLORIDE 0.9 % IV SOLN: 100.0000 mL | INTRAVENOUS | Status: DC | PRN

## 2019-02-06 MED ORDER — APIXABAN 5 MG PO TABS
10.0000 mg | ORAL_TABLET | Freq: Two times a day (BID) | ORAL | Status: DC
  Administered 2019-02-06 – 2019-02-09 (×7): 10 mg via ORAL
  Filled 2019-02-06 (×7): qty 2

## 2019-02-06 MED ORDER — MIDODRINE HCL 5 MG PO TABS
ORAL_TABLET | ORAL | Status: AC
  Filled 2019-02-06: qty 2

## 2019-02-06 MED ORDER — APIXABAN 5 MG PO TABS
5.0000 mg | ORAL_TABLET | Freq: Two times a day (BID) | ORAL | Status: DC
  Administered 2019-02-10: 5 mg via ORAL
  Filled 2019-02-06: qty 1

## 2019-02-06 NOTE — Progress Notes (Signed)
PROGRESS NOTE    Scott Mcdowell  VQM:[id number] DOB: October 28, 1940 DOA: 01/14/2019 PCP: [name], MD   Brief Narrative: Scott Mcdowell is a 78 y.o. male presenting with edema. Patient admitted with pneumonia and his hospital course has been complicated by AKI requiring renal replacement therapy, circulatory shock requiring vasopressors, respiratory failure requiring brief intubation, acute blood loss anemia from bleeding upper GI sources requiring EGD intervention and right upper extremity DVT requiring anticoagulation prior to being now complicated by upper GI bleeding.    Assessment & Plan:   Principal Problem:   Shock ([place]) Active Problems:   ARF (acute renal failure) (HCC)   Hyperkalemia   B12 deficiency anemia   Pleural effusion on right   Ascites   CAP (community acquired pneumonia)   Elevated troponin   Acute blood loss anemia   Upper GI bleed   Goals of care, counseling/discussion   Lambda light chain myeloma (HCC)   Pressure injury of skin   ESRD Hyperkalemia Initially managed with diuresis with poor effect. While in ICU, he was started on CRRT. Deemed ESRD. No urine output documented over last 24 hours. Dialysis catheter and left brachial cephalic AV fistula placed today by IR. -Nephrology recommendations: HD -Strict urine output  Hypotension/shock Patient required the use of vasopressors while in the ICU. Concern for relative adrenal insufficiency in the ICU and has been started on hydrocortisone in addition to midodrine. Blood pressure still soft and acutely worsened in setting of worsening anemia and recurrent melena. Hydrocortisone discontinued. -Continue midodrine  Anemia Thrombocytopenia Acute blood loss anemia Upper GI bleeding Hemoglobin stable. Recent EGD performed on 6/4 and significant for multiple areas of bleeding which were treated with clip and epi. Iron studies normal except a mildly elevated ferritin. GI signed off and  recommending to restart anticoagulation as indicated. Unfortunately, evidence he is possibly re-bleeding. To date, he has had 8 units of PRBC, 1 units of platelets and 3 units of FFP. Repeat EGD (6/14) without source of active bleeding. Hemoglobin stable. -Daily CBC  Right upper extremity DVT Treatment complicated by anemia requiring multiple transfusions, in addition to epistaxis and hemoptysis. Hemoglobin stable currently. -Will likely need to start Coumadin in light of renal disease, however, still high risk for bleeding. Hematology recommending Eliquis -Continue Heparin drip for now since he will be going for procedures  Tachypnea/Dyspnea Likely related to fluid overload from need for HD. Today, he has worsened shortness of breath, likely secondary to fluid overload. Recent chest x-ray significant for pulmonary edema. -Will manage with hemodialysis per nephrology  Pneumonia Associated right para-pneumonia effusion. Completed antibiotic course. Also underwent thoracentesis. 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 Given a course of IV decadron. -Oncology recommendations  Scrotal cellulitis Bilateral hydrocele Cellulitis resolved and scrotal swelling improved.  History of CVA Associated right sided deficits. Per patient, he was able to ambulate pretty well prior to current illness. PT/OT recommending CIR. CIR recommends consideration when patient is closer to discharge/oncology recs. Continues to work with PT.  Pressure injuries Sacrum, left heel, right heel, POA. Left buttock unknown if present on admission  Cirrhosis Seen on CT. Will need outpatient follow-up.  Obesity Body mass index is 30.88 kg/m.   DVT prophylaxis: SCDs, Heparin drip Code Status:   Code Status: Full Code Family Communication: None Disposition Plan: Possible CIR if able vs SNF likely early next  week   Consultants:   PCCM  Nephrology  [facility]  Procedures:   6/4: EGD Impression:               - Normal esophagus.                           - Adherent clot in the distal gastric body, unclear                            if NG tube trauma but treated with hemostasis clip                            and epi. Friable mucosa in the proximal stomach                            with contact oozing.                           - Red blood with active bleeding and clots in the                            duodenal bulb. 4 discrete areas of adherent clot                            with extremely friable tissue but no focal                            ulceration appreciated. One of these areas had a                            visible vessel which was bleeding, another lesion                            had active oozing. 2 areas treated as outlined                            above. 4 clips placed total in the duodenal bulb.                            Patient with lytic lesions noted on xray, unclear                            if he has underlying myeloma? These lesions appear                            to have high risk for relbeeding in the setting of                            coagulopathy. No active bleeding noted following                            endoscopic intervention.  Recommendation:           - Patient to remain in the ICU for ongoing care.                           -  NPO.                           - Continue present medications including IV PPI                           - Serial Hgb                           - Further workup pending course. If he survives                            these acute issues, may need repeat EGD to                            re-evaluate duodenal bulb following treatment with                            PPI and workup underlying liver disease   6/14: EGD Impression:               - Normal appearing, widely patent esophagus and GEJ.                            - Moderate patchy gastritis.                           - Small hiatal hernia.                           - Erythematous duodenopathy.                           - No specimens collected. Recommendation:           - Full liquid diet today.                           - Continue present medications.                           - Serial CBC's.  Antimicrobials:  Azithro 5/29 >> 5/31   Vanco 5/29 >> 5/30  Cefepime 5/30 >> 6/4    Subjective: Some dyspnea. No other concerns  Objective: Vitals:   02/05/19 1640 02/05/19 2047 02/06/19 0419 02/06/19 0853  BP: (!) 114/55 100/86 (!) 110/59 111/60  Pulse: 83 89 85 88  Resp: _0 Temp: 98.2 F (36.8 C) 98.2 F (36.8 C) 98.5 F (36.9 C) 97.9 F (36.6 C)  TempSrc: Oral Oral Oral Oral  SpO2: 99% 99% 99% 99%  Weight:  109.1 kg    Height:        Intake/Output Summary (Last 24 hours) at 02/06/2019 1357 Last data filed at 02/06/2019 0918 Gross per 24 hour  Intake 960 ml  Output 0 ml  Net 960 ml   Filed Weights   02/04/19 1157 02/04/19 2043 02/05/19 2047  Weight: 108.1 kg 113.5 kg 109.1 kg    Examination:  General exam: Appears calm and comfortable Respiratory system: Diminished. Respiratory effort normal. Cardiovascular system: S1 & S2 heard, RRR.  2/6 systolic murmur Gastrointestinal system: Abdomen is nondistended, soft and nontender. No organomegaly or masses felt. Normal bowel sounds heard. Central nervous system: Alert and oriented. No focal neurological deficits. Extremities: Significant RUE edema slightly improved. No calf tenderness Skin: No cyanosis. No rashes Psychiatry: Judgement and insight appear normal. Mood & affect appropriate.   Data Reviewed: I have personally reviewed following labs and imaging studies  CBC: Recent Labs  Lab 02/02/19 0405 02/03/19 0434 02/04/19 0428 02/05/19 1824 02/06/19 1342  WBC 6.0 5.1 6.6 8.7 8.1  HGB 7.4* 7.4* 7.5* 7.2* 7.2*  HCT 23.5* 23.5* 23.8* 23.3* 23.2*  MCV  102.2* 102.6* 103.5* 105.4* 105.5*  PLT 117* 120* 136* 164 027   Basic Metabolic Panel: Recent Labs  Lab 02/03/19 0434 02/03/19 1723 02/04/19 0428 02/05/19 1824 02/06/19 1005  NA 134* 135 135 137 137  K 5.9* 5.0 5.8* 4.9 5.0  CL 98 98 99 99 99  CO2 _0 GLUCOSE 132* 119* 161* 134* 152*  BUN 74* 80* 97* 68* 79*  CREATININE 5.48* 5.68* 6.63* 5.64* 6.50*  CALCIUM 8.8* 9.0 8.9 8.6* 8.8*  PHOS 5.5* 5.1* 6.7* 5.3* 4.5   GFR: Estimated Creatinine Clearance: 11.9 mL/min (A) (by C-G formula based on SCr of 6.5 mg/dL (H)). Liver Function Tests: Recent Labs  Lab 02/03/19 0434 02/03/19 1723 02/04/19 0428 02/05/19 1824 02/06/19 1005  ALBUMIN 3.5 3.8 3.6 3.5 3.5   No results for input(s): LIPASE, AMYLASE in the last 168 hours. No results for input(s): AMMONIA in the last 168 hours. Coagulation Profile: Recent Labs  Lab 02/05/19 1824  INR 1.2   Cardiac Enzymes: No results for input(s): CKTOTAL, CKMB, CKMBINDEX, TROPONINI in the last 168 hours. BNP (last 3 results) No results for input(s): PROBNP in the last 8760 hours. HbA1C: No results for input(s): HGBA1C in the last 72 hours. CBG: Recent Labs  Lab 01/31/19 1002  GLUCAP 85   Lipid Profile: No results for input(s): CHOL, HDL, LDLCALC, TRIG, CHOLHDL, LDLDIRECT in the last 72 hours. Thyroid Function Tests: No results for input(s): TSH, T4TOTAL, FREET4, T3FREE, THYROIDAB in the last 72 hours. Anemia Panel: No results for input(s): VITAMINB12, FOLATE, FERRITIN, TIBC, IRON, RETICCTPCT in the last 72 hours. Sepsis Labs: No results for input(s): PROCALCITON, LATICACIDVEN in the last 168 hours.  No results found for this or any previous visit (from the past 240 hour(s)).     Radiology Studies: Dg Chest Port 1 View  Result Date: 02/05/2019 CLINICAL DATA:  Dialysis. EXAM: PORTABLE CHEST 1 VIEW COMPARISON:  02/04/2019. FINDINGS: Dialysis catheter noted with tip projected over the right atrium. Cardiomegaly with  pulmonary venous congestion and bilateral interstitial prominence. Right-sided pleural effusion. Findings consistent with CHF. Pneumonitis cannot be excluded. Lytic lesion noted the right posterior fifth rib and possibly the right posterolateral seventh rib. A process such as myeloma or metastatic disease could present in this fashion. IMPRESSION: 1.  Dialysis catheter noted with tip projected over right atrium. 2. Cardiomegaly with pulmonary venous congestion bilateral interstitial prominence and right-sided pleural effusion. Findings most consistent with CHF. 3. Lytic lesion noted in the right posterior fifth rib and possibly the right posterolateral seventh rib. A process such as myeloma or metastatic disease could present in this fashion. Electronically Signed   By: Marcello Moores  Register   On: 02/05/2019 12:33   Dg Cyndy Freeze Guide Cv Line-no Report  Result Date: 02/05/2019 Fluoroscopy was utilized by the requesting physician.  No radiographic interpretation.  Scheduled Meds:  apixaban  10 mg Oral BID   Followed by   Derrill Memo ON 02/12/2019] apixaban  5 mg Oral BID   chlorhexidine gluconate (MEDLINE KIT)  15 mL Mouth Rinse BID   Chlorhexidine Gluconate Cloth  6 each Topical Q0600   darbepoetin (ARANESP) injection - DIALYSIS  200 mcg Intravenous Q Tue-HD   feeding supplement (NEPRO CARB STEADY)  237 mL Oral TID BM   mouth rinse  15 mL Mouth Rinse q12n4p   midodrine  10 mg Oral TID WC   multivitamin  1 tablet Oral QHS   nystatin   Topical TID   pantoprazole  40 mg Oral Daily   polyethylene glycol  17 g Oral Daily   sevelamer carbonate  2.4 g Oral TID WC   Continuous Infusions:  sodium chloride Stopped (02/03/19 1409)   sodium chloride     sodium chloride       LOS: 23 days     Cordelia Poche, MD [facility] 02/06/2019, 1:57 PM  If 7PM-7AM, please contact night-coverage [email or web address]

## 2019-02-06 NOTE — Progress Notes (Signed)
[facility]    NEPHROLOGY PROGRESS NOTE  SUBJECTIVE: Patient seen and examined.  Complaining of some mild shortness of breath today.  Denies chest pain, nausea, vomiting, diarrhea or dysuria.  Is scheduled for dialysis this afternoon.  All other review of systems are negative.  OBJECTIVE:  Vitals:   02/06/19 0419 02/06/19 0853  BP: (!) 110/59 111/60  Pulse: 85 88  Resp: 18 18  Temp: 98.5 F (36.9 C) 97.9 F (36.6 C)  SpO2: 99% 99%    Intake/Output Summary (Last 24 hours) at 02/06/2019 1224 Last data filed at 02/06/2019 0918 Gross per 24 hour  Intake 1200 ml  Output 0 ml  Net 1200 ml      General:  AAOx3 NAD HEENT: MMM [place] AT anicteric sclera Neck:  No JVD, no adenopathy CV:  Heart RRR  Lungs:  L/S with bibasilar Rales. Abd:  abd SNT/ND with normal BS GU:  Bladder non-palpable Extremities: +1 bilateral lower extremity edema Skin:  No skin rash  MEDICATIONS:  . apixaban  10 mg Oral BID   Followed by  . [START ON 02/12/2019] apixaban  5 mg Oral BID  . chlorhexidine gluconate (MEDLINE KIT)  15 mL Mouth Rinse BID  . Chlorhexidine Gluconate Cloth  6 each Topical Q0600  . darbepoetin (ARANESP) injection - DIALYSIS  200 mcg Intravenous Q Tue-HD  . feeding supplement (NEPRO CARB STEADY)  237 mL Oral TID BM  . mouth rinse  15 mL Mouth Rinse q12n4p  . midodrine  10 mg Oral TID WC  . multivitamin  1 tablet Oral QHS  . nystatin   Topical TID  . pantoprazole  40 mg Oral Daily  . polyethylene glycol  17 g Oral Daily  . sevelamer carbonate  2.4 g Oral TID WC       LABS:   CBC Latest Ref Rng & Units 02/05/2019 02/04/2019 02/03/2019  WBC 4.0 - 10.5 K/uL 8.7 6.6 5.1  Hemoglobin 13.0 - 17.0 g/dL 7.2(L) 7.5(L) 7.4(L)  Hematocrit 39.0 - 52.0 % 23.3(L) 23.8(L) 23.5(L)  Platelets 150 - 400 K/uL 164 136(L) 120(L)    CMP Latest Ref Rng & Units 02/06/2019 02/05/2019 02/04/2019  Glucose 70 - 99 mg/dL 152(H) 134(H) 161(H)  BUN 8 - 23 mg/dL 79(H) 68(H) 97(H)  Creatinine  0.61 - 1.24 mg/dL 6.50(H) 5.64(H) 6.63(H)  Sodium 135 - 145 mmol/L 137 137 135  Potassium 3.5 - 5.1 mmol/L 5.0 4.9 5.8(H)  Chloride 98 - 111 mmol/L 99 99 99  CO2 22 - 32 mmol/L _0 Calcium 8.9 - 10.3 mg/dL 8.8(L) 8.6(L) 8.9  Total Protein 6.5 - 8.1 g/dL - - -  Total Bilirubin 0.3 - 1.2 mg/dL - - -  Alkaline Phos 38 - 126 U/L - - -  AST 15 - 41 U/L - - -  ALT 0 - 44 U/L - - -    Lab Results  Component Value Date   PTH 40 02/02/2019   PTH Comment 02/02/2019   CALCIUM 8.8 (L) 02/06/2019   PHOS 4.5 02/06/2019       Component Value Date/Time   COLORURINE YELLOW 01/17/2019 1029   APPEARANCEUR HAZY (A) 01/17/2019 1029   LABSPEC 1.012 01/17/2019 1029   PHURINE 5.0 01/17/2019 1029   GLUCOSEU NEGATIVE 01/17/2019 [place] 01/17/2019 [place] 01/17/2019 1029   KETONESUR 5 (A) 01/17/2019 [place] 01/17/2019 1029   NITRITE NEGATIVE 01/17/2019 [place] 01/17/2019 1029  Component Value Date/Time   PHART 7.414 01/22/2019 0839   PCO2ART 36.8 01/22/2019 0839   PO2ART 104 01/22/2019 0839   HCO3 23.1 01/22/2019 0839   ACIDBASEDEF 0.8 01/22/2019 0839   O2SAT 97.8 01/22/2019 0839       Component Value Date/Time   IRON 41 (L) 01/30/2019 0308   TIBC 305 01/30/2019 0308   FERRITIN 724 (H) 01/30/2019 0308   IRONPCTSAT 13 (L) 01/30/2019 0308       ASSESSMENT/PLAN:    78-year-old male patient who presented with pneumonia complicated by acute kidney injury requiring renal replacement therapy.  He is dialyzing via a tunneled right IJ catheter.  He had subsequent respiratory failure requiring intubation, and acute blood loss anemia from upper GI bleeding.  He also had a right upper extremity DVT requiring anticoagulation.  1.  Chronic kidney disease stage IV.  His serum creatinine was 2.4 on admission.  2.  Acute kidney injury.  Likely dialysis dependent at this point.  We will continue dialysis on a Tuesday,  Thursday, Saturday dialysis schedule.  Is dialyzing via a right IJ tunnel catheter.  He has a left brachiocephalic AV fistula in place.  3.  Pneumonia.  Resolved.  4.  Plasma cell myeloma.  Per oncology.  5.  Right upper extremity DVT.  Continue heparin.       , DO, FACP  

## 2019-02-06 NOTE — Progress Notes (Signed)
Pt just had 9 beats of V tach. RN checked on pt and he stated he feels fine. Denies SOB and chest pain. Pt is watching tv. Messaged NP on call to make aware.   Eleanora Neighbor, RN

## 2019-02-06 NOTE — Progress Notes (Signed)
[facility]  Subjective  - No complaints.  States left hand feels good.   Objective 111/60 88 97.9 F (36.6 C) (Oral) 18 99%  Intake/Output Summary (Last 24 hours) at 02/06/2019 0856 Last data filed at 02/06/2019 0419 Gross per 24 hour  Intake 2175 ml  Output 10 ml  Net 2165 ml    RIJ tunneled catheter no issues Left brachiocephalic AVF good thrill, no hematoma, incisions c/d/i  Laboratory Lab Results: Recent Labs    02/04/19 0428 02/05/19 1824  WBC 6.6 8.7  HGB 7.5* 7.2*  HCT 23.8* 23.3*  PLT 136* 164   BMET Recent Labs    02/04/19 0428 02/05/19 1824  NA 135 137  K 5.8* 4.9  CL 99 99  CO2 24 26  GLUCOSE 161* 134*  BUN 97* 68*  CREATININE 6.63* 5.64*  CALCIUM 8.9 8.6*    COAG Lab Results  Component Value Date   INR 1.2 02/05/2019   INR 1.3 (H) 01/21/2019   INR 1.6 (H) 01/20/2019   No results found for: PTT  Assessment/Planning: POD #1 s/p RIJ tunneled catheter and left brachiocephalic AVF.  Good thrill in fistula and hand feels good. Vascular will sign off.  Will arrange follow-up in one month with left arm fistula duplex to ensure it is maturing appropriately.  Call with questions or concerns.  Scott Mcdowell 02/06/2019 8:56 AM --

## 2019-02-07 LAB — RENAL FUNCTION PANEL
Albumin: 3.3 g/dL — ABNORMAL LOW (ref 3.5–5.0)
Anion gap: 13 (ref 5–15)
BUN: 44 mg/dL — ABNORMAL HIGH (ref 8–23)
CO2: 24 mmol/L (ref 22–32)
Calcium: 8.8 mg/dL — ABNORMAL LOW (ref 8.9–10.3)
Chloride: 100 mmol/L (ref 98–111)
Creatinine, Ser: 4.61 mg/dL — ABNORMAL HIGH (ref 0.61–1.24)
GFR calc Af Amer: 13 mL/min — ABNORMAL LOW (ref >60)
GFR calc non Af Amer: 11 mL/min — ABNORMAL LOW (ref >60)
Glucose, Bld: 125 mg/dL — ABNORMAL HIGH (ref 70–99)
Phosphorus: 3.9 mg/dL (ref 2.5–4.6)
Potassium: 5 mmol/L (ref 3.5–5.1)
Sodium: 137 mmol/L (ref 135–145)

## 2019-02-07 NOTE — Progress Notes (Signed)
[facility]    NEPHROLOGY PROGRESS NOTE  SUBJECTIVE: Patient seen and examined.  Improved shortness of breath after dialysis yesterday.  Denies chest pain, nausea, vomiting, diarrhea or dysuria. All other review of systems are negative.  OBJECTIVE:  Vitals:   02/06/19 2035 02/07/19 0904  BP: (!) 100/40 (!) 101/24  Pulse: 79 88  Resp: 18 18  Temp: 98.2 F (36.8 C) 98.5 F (36.9 C)  SpO2: 100% 100%    Intake/Output Summary (Last 24 hours) at 02/07/2019 1443 Last data filed at 02/07/2019 1300 Gross per 24 hour  Intake 960 ml  Output 2860 ml  Net -1900 ml      General:  AAOx3 NAD HEENT: MMM [place] AT anicteric sclera Neck:  No JVD, no adenopathy CV:  Heart RRR  Lungs:  L/S with fine bibasilar Rales. Abd:  abd SNT/ND with normal BS GU:  Bladder non-palpable Extremities: +1 bilateral lower extremity edema Skin:  No skin rash  MEDICATIONS:  . apixaban  10 mg Oral BID   Followed by  . [START ON 02/12/2019] apixaban  5 mg Oral BID  . chlorhexidine gluconate (MEDLINE KIT)  15 mL Mouth Rinse BID  . Chlorhexidine Gluconate Cloth  6 each Topical Q0600  . darbepoetin (ARANESP) injection - DIALYSIS  200 mcg Intravenous Q Tue-HD  . feeding supplement (NEPRO CARB STEADY)  237 mL Oral TID BM  . mouth rinse  15 mL Mouth Rinse q12n4p  . midodrine  10 mg Oral TID WC  . multivitamin  1 tablet Oral QHS  . nystatin   Topical TID  . pantoprazole  40 mg Oral Daily  . polyethylene glycol  17 g Oral Daily  . sevelamer carbonate  2.4 g Oral TID WC       LABS:   CBC Latest Ref Rng & Units 02/06/2019 02/05/2019 02/04/2019  WBC 4.0 - 10.5 K/uL 8.1 8.7 6.6  Hemoglobin 13.0 - 17.0 g/dL 7.2(L) 7.2(L) 7.5(L)  Hematocrit 39.0 - 52.0 % 23.2(L) 23.3(L) 23.8(L)  Platelets 150 - 400 K/uL 169 164 136(L)    CMP Latest Ref Rng & Units 02/07/2019 02/06/2019 02/05/2019  Glucose 70 - 99 mg/dL 125(H) 152(H) 134(H)  BUN 8 - 23 mg/dL 44(H) 79(H) 68(H)  Creatinine 0.61 - 1.24 mg/dL 4.61(H)  6.50(H) 5.64(H)  Sodium 135 - 145 mmol/L 137 137 137  Potassium 3.5 - 5.1 mmol/L 5.0 5.0 4.9  Chloride 98 - 111 mmol/L 100 99 99  CO2 22 - 32 mmol/L '24 23 26  ' Calcium 8.9 - 10.3 mg/dL 8.8(L) 8.8(L) 8.6(L)  Total Protein 6.5 - 8.1 g/dL - - -  Total Bilirubin 0.3 - 1.2 mg/dL - - -  Alkaline Phos 38 - 126 U/L - - -  AST 15 - 41 U/L - - -  ALT 0 - 44 U/L - - -    Lab Results  Component Value Date   PTH 40 02/02/2019   PTH Comment 02/02/2019   CALCIUM 8.8 (L) 02/07/2019   PHOS 3.9 02/07/2019       Component Value Date/Time   COLORURINE YELLOW 01/17/2019 1029   APPEARANCEUR HAZY (A) 01/17/2019 1029   LABSPEC 1.012 01/17/2019 1029   PHURINE 5.0 01/17/2019 1029   GLUCOSEU NEGATIVE 01/17/2019 1029   HGBUR NEGATIVE 01/17/2019 1029   BILIRUBINUR NEGATIVE 01/17/2019 1029   KETONESUR 5 (A) 01/17/2019 1029   PROTEINUR NEGATIVE 01/17/2019 1029   NITRITE NEGATIVE 01/17/2019 1029   LEUKOCYTESUR NEGATIVE 01/17/2019 1029      Component Value  Date/Time   PHART 7.414 01/22/2019 0839   PCO2ART 36.8 01/22/2019 0839   PO2ART 104 01/22/2019 0839   HCO3 23.1 01/22/2019 0839   ACIDBASEDEF 0.8 01/22/2019 0839   O2SAT 97.8 01/22/2019 0839       Component Value Date/Time   IRON 41 (L) 01/30/2019 0308   TIBC 305 01/30/2019 0308   FERRITIN 724 (H) 01/30/2019 0308   IRONPCTSAT 13 (L) 01/30/2019 0308       ASSESSMENT/PLAN:    78 year old male patient who presented with pneumonia complicated by acute kidney injury requiring renal replacement therapy.  He is dialyzing via a tunneled right IJ catheter.  He had subsequent respiratory failure requiring intubation, and acute blood loss anemia from upper GI bleeding.  He also had a right upper extremity DVT requiring anticoagulation.  1.  Chronic kidney disease stage IV.  His serum creatinine was 2.4 on admission.  2.  Acute kidney injury.  Likely dialysis dependent at this point.  We will continue dialysis on a Tuesday, Thursday, Saturday dialysis  schedule.  Is dialyzing via a right IJ tunnel catheter.  He has a left brachiocephalic AV fistula in place.  3.  Pneumonia.  Resolved.  4.  Plasma cell myeloma.  Per oncology.  5.  Right upper extremity DVT.  Continue heparin.      Man, DO, [place]

## 2019-02-07 NOTE — Progress Notes (Signed)
Pt stated the last time he voided was a couple days ago.   Eleanora Neighbor, RN

## 2019-02-07 NOTE — Progress Notes (Addendum)
[facility] PROGRESS NOTE  Scott Mcdowell SVX:[id number] DOB: [birth year] DOA: 01/14/2019 PCP: [name], MD   78 year old Caucasian male Previous right-sided CVA Admitted 01/15/2019 -Pneumonia right pleural effusion AKI + hyperkalemia, hypercalcemia--- also on admission found to have bilateral hydrocele Recent right brachial vein venous thrombosis 01/19/2019 on anticoagulation  Transferred to ICU 2/2 GI bleed, hypovolemic shock-placed on Neo-Synephrine on 6/3-urgent endoscopy performed = adherent clot distal body gastric area Rx hemostasis clip, epinephrine  2/2 persistent azotemia-myeloma work-up performed-CRRT performed on ICU Vascular consulted-tunneled dialysis cath placed 6/20 Myeloma panel + bone marrow biopsy 6/9 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 Tuesday HD Recheck stores every 3 weekly to every monthly? 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 HTN Holding ACE inhibitor Multiple decubiti sacrum/heels Keep Prevalon boots on Wound care input and frequent turning   Currently on Eliquis Full code Inpatient Patient is insistent on updating his wife himself--- social work to follow-up in a.m. regarding plans for Toney Sang, MD  [facility] [place] -[email or web address] 7PM-7AM contact night coverage as above 02/07/2019, 1:44 PM  LOS: 24 days   Consultants:  Nephrology, vascular surgery  Procedures:  Fistula placement 6/19  Antimicrobials:  None currently  Interval history/Subjective: Awake alert coherent no distress No fever no chills Tolerating some diet--- overall feels well although weak Nursing reports that out in chair with [facility] lift for 3 hours today--he kept sliding out per his report.  Objective:  Vitals:  Vitals:   02/06/19 2035 02/07/19 0904  BP: (!) 100/40 (!) 101/24  Pulse: 79 88  Resp: 18 18  Temp: 98.2 F (36.8 C) 98.5 F (36.9 C)  SpO2: 100% 100%    Exam:  EOMI NCAT External ocular movements intact CTA B Dense hemiparesis on right side-able to however have 3/5 power in right upper extremity grade 0 power in left lower extremity Onychogryphosis on right side with stage I decubiti on both heels I did not examine sacrum today Abdomen soft no rebound no guarding Chest clear S1-S2 no murmur   I have personally reviewed the following:  DATA    Labs:  Hemoglobin 7.2 platelet 169  Potassium 5.0 BUN/creatinine 44/4.6 down from 79/6.5  INR 1.2  Imaging studies:  None currently  Medical tests:  No    Scheduled Meds: . apixaban  10 mg Oral BID   Followed by  . [START ON 02/12/2019] apixaban  5 mg Oral BID  .  chlorhexidine gluconate (MEDLINE KIT)   15 mL Mouth Rinse BID  . Chlorhexidine Gluconate Cloth  6 each Topical Q0600  . darbepoetin (ARANESP) injection - DIALYSIS  200 mcg Intravenous Q Tue-HD  . feeding supplement (NEPRO CARB STEADY)  237 mL Oral TID BM  . mouth rinse  15 mL Mouth Rinse q12n4p  . midodrine  10 mg Oral TID WC  . multivitamin  1 tablet Oral QHS  . nystatin   Topical TID  . pantoprazole  40 mg Oral Daily  . polyethylene glycol  17 g Oral Daily  . sevelamer carbonate  2.4 g Oral TID WC   Continuous Infusions: . sodium chloride Stopped (02/03/19 1409)    Principal Problem:   Shock ([place]) Active Problems:   ARF (acute renal failure) (HCC)   Hyperkalemia   B12 deficiency anemia   Pleural effusion on right   Ascites   CAP (community acquired pneumonia)   Elevated troponin   Acute blood loss anemia   Upper GI bleed   Goals of care, counseling/discussion   Lambda light chain myeloma (HCC)   Pressure injury of skin   LOS: 24 days

## 2019-02-07 NOTE — Progress Notes (Signed)
ANTICOAGULATION CONSULT NOTE  Pharmacy Consult for heparin to apixaban Indication: DVT  No Known Allergies  Patient Measurements: Height: 6\' 2"  (188 cm) Weight: 236 lb 5.3 oz (107.2 kg) IBW/kg (Calculated) : 82.2 Heparin Dosing Weight: 104 kg  Vital Signs: Temp: 98.5 F (36.9 C) (06/21 0904) Temp Source: Oral (06/21 0904) BP: 101/24 (06/21 0904) Pulse Rate: 88 (06/21 0904)  Labs: Recent Labs    02/05/19 0125 02/05/19 1824 02/06/19 1005 02/06/19 1342 02/07/19 0816  HGB  --  7.2*  --  7.2*  --   HCT  --  23.3*  --  23.2*  --   PLT  --  164  --  169  --   LABPROT  --  15.1  --   --   --   INR  --  1.2  --   --   --   HEPARINUNFRC 0.67  --   --   --   --   CREATININE  --  5.64* 6.50*  --  4.61*    Estimated Creatinine Clearance: 16.7 mL/min (A) (by C-G formula based on SCr of 4.61 mg/dL (H)).  Assessment: 78 yo male with RUE DVT . Pharmacy consulted 02/05/19 to transition from heparin to apixaban.  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  Known upper GIB - recent EGD 01/21/19.  Hgb 6.8>>7.5>7.2>7.2 ; pltc inc 93 > 120>136>164 169k. To date, he has had 8 units of PRBC, 1 units of platelets and 3 units of FFP.  Hematology recommended Eliquis.   Goal of Therapy:  Monitor platelets by anticoagulation protocol: Yes   Plan:  Will adjust Eliquis 10 mg po BID to continue x 2 more days to complete total 7 days (includes therpaeutic IV heparin 6/17-6/19) then on 6/24 reduce to 5 mg po BID.  Monitor for s/sx of bleeding  Thank you Nicole Cella , RPh Please see AMION for all pharmacy numbers 02/07/2019 4:44 PM

## 2019-02-08 LAB — RENAL FUNCTION PANEL
Albumin: 3.3 g/dL — ABNORMAL LOW (ref 3.5–5.0)
Anion gap: 12 (ref 5–15)
BUN: 59 mg/dL — ABNORMAL HIGH (ref 8–23)
CO2: 24 mmol/L (ref 22–32)
Calcium: 8.9 mg/dL (ref 8.9–10.3)
Chloride: 99 mmol/L (ref 98–111)
Creatinine, Ser: 5.84 mg/dL — ABNORMAL HIGH (ref 0.61–1.24)
GFR calc Af Amer: 10 mL/min — ABNORMAL LOW (ref >60)
GFR calc non Af Amer: 8 mL/min — ABNORMAL LOW (ref >60)
Glucose, Bld: 104 mg/dL — ABNORMAL HIGH (ref 70–99)
Phosphorus: 4.2 mg/dL (ref 2.5–4.6)
Potassium: 4.8 mmol/L (ref 3.5–5.1)
Sodium: 135 mmol/L (ref 135–145)

## 2019-02-08 MED ORDER — SENNOSIDES-DOCUSATE SODIUM 8.6-50 MG PO TABS
1.0000 | ORAL_TABLET | Freq: Every day | ORAL | Status: DC
  Administered 2019-02-08 – 2019-02-09 (×2): 1 via ORAL
  Filled 2019-02-08 (×2): qty 1

## 2019-02-08 NOTE — Progress Notes (Signed)
[facility]    NEPHROLOGY PROGRESS NOTE  SUBJECTIVE: Patient seen and examined.  Improved shortness of breath after dialysis yesterday.  Denies chest pain, nausea, vomiting, diarrhea or dysuria. All other review of systems are negative.  OBJECTIVE:  Vitals:   02/08/19 0533 02/08/19 0950  BP: (!) 106/44 (!) 114/46  Pulse: 78 83  Resp: 19 18  Temp: 97.7 F (36.5 C) (!) 97.5 F (36.4 C)  SpO2: 96% 98%    Intake/Output Summary (Last 24 hours) at 02/08/2019 1540 Last data filed at 02/08/2019 0931 Gross per 24 hour  Intake 660 ml  Output 1 ml  Net 659 ml      General:  AAOx3 NAD HEENT: MMM [place] AT anicteric sclera Neck:  No JVD, no adenopathy CV:  Heart RRR  Lungs:  L/S with fine bibasilar Rales. Abd:  abd SNT/ND with normal BS GU:  Bladder non-palpable Extremities: +1 bilateral lower extremity edema Skin:  No skin rash  MEDICATIONS:  . apixaban  10 mg Oral BID   Followed by  . [START ON 02/10/2019] apixaban  5 mg Oral BID  . chlorhexidine gluconate (MEDLINE KIT)  15 mL Mouth Rinse BID  . Chlorhexidine Gluconate Cloth  6 each Topical Q0600  . darbepoetin (ARANESP) injection - DIALYSIS  200 mcg Intravenous Q Tue-HD  . feeding supplement (NEPRO CARB STEADY)  237 mL Oral TID BM  . mouth rinse  15 mL Mouth Rinse q12n4p  . midodrine  10 mg Oral TID WC  . multivitamin  1 tablet Oral QHS  . nystatin   Topical TID  . pantoprazole  40 mg Oral Daily  . polyethylene glycol  17 g Oral Daily  . sevelamer carbonate  2.4 g Oral TID WC       LABS:   CBC Latest Ref Rng & Units 02/06/2019 02/05/2019 02/04/2019  WBC 4.0 - 10.5 K/uL 8.1 8.7 6.6  Hemoglobin 13.0 - 17.0 g/dL 7.2(L) 7.2(L) 7.5(L)  Hematocrit 39.0 - 52.0 % 23.2(L) 23.3(L) 23.8(L)  Platelets 150 - 400 K/uL 169 164 136(L)    CMP Latest Ref Rng & Units 02/08/2019 02/07/2019 02/06/2019  Glucose 70 - 99 mg/dL 104(H) 125(H) 152(H)  BUN 8 - 23 mg/dL 59(H) 44(H) 79(H)  Creatinine 0.61 - 1.24 mg/dL 5.84(H) 4.61(H)  6.50(H)  Sodium 135 - 145 mmol/L 135 137 137  Potassium 3.5 - 5.1 mmol/L 4.8 5.0 5.0  Chloride 98 - 111 mmol/L 99 100 99  CO2 22 - 32 mmol/L '24 24 23  ' Calcium 8.9 - 10.3 mg/dL 8.9 8.8(L) 8.8(L)  Total Protein 6.5 - 8.1 g/dL - - -  Total Bilirubin 0.3 - 1.2 mg/dL - - -  Alkaline Phos 38 - 126 U/L - - -  AST 15 - 41 U/L - - -  ALT 0 - 44 U/L - - -    Lab Results  Component Value Date   PTH 40 02/02/2019   PTH Comment 02/02/2019   CALCIUM 8.9 02/08/2019   PHOS 4.2 02/08/2019       Component Value Date/Time   COLORURINE YELLOW 01/17/2019 1029   APPEARANCEUR HAZY (A) 01/17/2019 1029   LABSPEC 1.012 01/17/2019 1029   PHURINE 5.0 01/17/2019 1029   GLUCOSEU NEGATIVE 01/17/2019 1029   HGBUR NEGATIVE 01/17/2019 1029   BILIRUBINUR NEGATIVE 01/17/2019 1029   KETONESUR 5 (A) 01/17/2019 1029   PROTEINUR NEGATIVE 01/17/2019 1029   NITRITE NEGATIVE 01/17/2019 1029   LEUKOCYTESUR NEGATIVE 01/17/2019 1029      Component Value  Date/Time   PHART 7.414 01/22/2019 0839   PCO2ART 36.8 01/22/2019 0839   PO2ART 104 01/22/2019 0839   HCO3 23.1 01/22/2019 0839   ACIDBASEDEF 0.8 01/22/2019 0839   O2SAT 97.8 01/22/2019 0839       Component Value Date/Time   IRON 41 (L) 01/30/2019 0308   TIBC 305 01/30/2019 0308   FERRITIN 724 (H) 01/30/2019 0308   IRONPCTSAT 13 (L) 01/30/2019 0308       ASSESSMENT/PLAN:    78 year old male patient who presented with pneumonia complicated by acute kidney injury requiring renal replacement therapy.  He is dialyzing via a tunneled right IJ catheter.  He had subsequent respiratory failure requiring intubation, and acute blood loss anemia from upper GI bleeding.  He also had a right upper extremity DVT requiring anticoagulation.  1.  Chronic kidney disease stage IV.  His serum creatinine was 2.4 on admission.  2.  Acute kidney injury.  Likely dialysis dependent at this point.  We will continue dialysis on a Tuesday, Thursday, Saturday dialysis schedule.  Is  dialyzing via a right IJ tunnel catheter.  He has a left brachiocephalic AV fistula in place.  Given advanced underlying renal failure, he is likely end-stage.  3.  Pneumonia.  Resolved.  4.  Plasma cell myeloma.  Per oncology.  5.  Right upper extremity DVT.  Continue heparin.      [place], DO, [place]

## 2019-02-08 NOTE — Progress Notes (Signed)
Physical Therapy Treatment Patient Details Name: Scott Mcdowell MRN: [id number] DOB: June 05, 1941 Today's Date: 02/08/2019    History of Present Illness 78 yo male admitted with ARF, scrotal cellulitis, Pna. Pt extubated 01/22/19.  Hx of CVA with R hemiparesis    PT Comments    Pt continues to require significant assistance for mobility and transfers and will benefit from CIR to improve strength and functional mobility   Follow Up Recommendations  CIR     Equipment Recommendations       Recommendations for Other Services       Precautions / Restrictions Precautions Precautions: Fall Precaution Comments: R hemiparesis, h/o 1 fall in past 1 year Restrictions Weight Bearing Restrictions: No    Mobility  Bed Mobility         Supine to sit: Mod assist Sit to supine: Mod assist   General bed mobility comments: mod A for trunk and LEs  Transfers   Equipment used: Rolling walker (2 wheeled)   Sit to Stand: Max assist         General transfer comment: pt performed sit <> stand from elevated bed with max A with LT UE pulling up on RW.  once standing pt able to stand with min A 2 x 3 minutes for performing cleaning and hygiene  Ambulation/Gait                 Stairs             Wheelchair Mobility    Modified Rankin (Stroke Patients Only)       Balance     Sitting balance-Leahy Scale: Fair Sitting balance - Comments: pt able to sit edge of bed and scoot edge of bed with min facilitaiton for hand placement and wt shifts                                    Cognition Arousal/Alertness: Awake/alert Behavior During Therapy: WFL for tasks assessed/performed Overall Cognitive Status: Within Functional Limits for tasks assessed                                        Exercises      General Comments        Pertinent Vitals/Pain Pain Assessment: No/denies pain    Home Living                       Prior Function            PT Goals (current goals can now be found in the care plan section) Progress towards PT goals: Progressing toward goals    Frequency    Min 3X/week      PT Plan Current plan remains appropriate    Co-evaluation              AM-PAC PT "6 Clicks" Mobility   Outcome Measure  Help needed turning from your back to your side while in a flat bed without using bedrails?: A Little Help needed moving from lying on your back to sitting on the side of a flat bed without using bedrails?: A Little Help needed moving to and from a bed to a chair (including a wheelchair)?: A Lot Help needed standing up from a chair using your arms (e.g., wheelchair or bedside chair)?: A Lot  Help needed to walk in hospital room?: A Lot Help needed climbing 3-5 steps with a railing? : Total 6 Click Score: 13    End of Session Equipment Utilized During Treatment: Gait belt Activity Tolerance: Patient tolerated treatment well Patient left: with call bell/phone within reach;in bed;with bed alarm set;with nursing/sitter in room   PT Visit Diagnosis: Muscle weakness (generalized) (M62.81);Difficulty in walking, not elsewhere classified (R26.2);Hemiplegia and hemiparesis Hemiplegia - Right/Left: Right Hemiplegia - dominant/non-dominant: Dominant Hemiplegia - caused by: Cerebral infarction     Time: 4458-4835 PT Time Calculation (min) (ACUTE ONLY): 28 min  Charges:  $Therapeutic Activity: 23-37 mins                     Isabelle Course, PT, DPT   Lenton Gendreau 02/08/2019, 10:05 AM

## 2019-02-08 NOTE — Progress Notes (Signed)
Patient refused to update his wife as he has already done.

## 2019-02-08 NOTE — Progress Notes (Signed)
Actually, I think [name] is looking a little bit better.  He definitely sounds better.  He had dialysis I think on Friday.  He is now on Eliquis.  This is should try to help with the thromboembolic event on the right arm.  Right arm is still swollen but maybe not as swollen.  He says he is going to rehab today or tomorrow.  Not sure this truly is the case.  His BUN is 59 creatinine 5.84.  He got high-dose Decadron for the lambda light chain myeloma.  He tolerated this well.  It is just way too early to know if this is done anything for him.  Once he is an outpatient, he clearly is going to need formal chemotherapy.  His CBC is not yet back today.  It seems like he is eating okay.  He says he not having any problems with nausea or vomiting.  He does have quite a few ecchymoses on his extremities.  It does not sound like there is any diarrhea.  His vital signs are temperature 97.7.  Pulse 78.  Blood pressure 106/44.  Head neck exam shows no ocular or oral lesions.  He has no palpable cervical or supra clavicular lymph nodes.  Lungs are relatively clear bilaterally.  Cardiac exam regular rate and rhythm.  There is 1/6 systolic ejection murmur.  Abdomen is soft.  Neurological exam does not show any obvious neurologic deficit.  His main underlying etiology for a lot of this I think is probably the light chain myeloma.  He will never have normal renal function.  I suspect he will be on dialysis for life.  He has had no GI bleeding which is encouraging.  He is on the Eliquis.  I am still not clear as to what the plans are for his rehab.  I do not know if he is going to inpatient rehab or some skilled nursing facility.  We will continue to follow along.   Lattie Haw, MD  Hebrew[place] 11:1

## 2019-02-08 NOTE — Progress Notes (Signed)
Inpatient Rehabilitation-Admissions Coordinator   [facility] has received insurance approval and is following for readiness.  Per Dr. Verlon Au, pt may be ready for admit to CIR tomorrow.   AC to follow up tomorrow.   Jhonnie Garner, OTR/L  Rehab Admissions Coordinator  [phone] 02/08/2019 4:47 PM

## 2019-02-08 NOTE — TOC Benefit Eligibility Note (Signed)
Transition of Care [facility]) Benefit Eligibility Note    Patient Details  Name: Scott Mcdowell MRN: [id number] Date of Birth: 08-Mar-1941   Medication/Dose: Arne [place]  2.5 MG BID  Covered?: Yes  Tier: 3 Drug  Prescription Coverage Preferred Pharmacy: [facility] with Person/Company/Phone Number:: [facility] #  [phone]  Co-Pay: $ 47.00  Prior Approval: No     Additional Notes: ELIQUIS  5 MG BID  COVER- YES, CO-PAY- $47.00, TIER- 3 DRUG, P/A- NO    Memory Argue Phone Number: 02/08/2019, 9:54 AM

## 2019-02-08 NOTE — H&P (Addendum)
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  Per chart review and patient, patient lives with his spouse.  Independent with assistive device prior to admission.  Presented to [facility] ED 01/15/2019 with shortness of breath and scrotal edema.  Labs were obtained.  Sodium 137, potassium 5.6, BUN 80, creatinine 2.5.  Troponin  0.07.  COVID negative.  CT chest abdomen pelvis showed moderate right pleural effusion associated with atelectasis versus consolidation.  Mild left basilar scarring.  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  The right common iliac measuring 2.3 cm in caliber left common iliac measuring 2.3.  Testicular ultrasound no evidence of testicular mass.  Patient was transferred to [facility] for further evaluation.  He was noted to have a pleural effusion and had thoracentesis on 01/16/2019 with drainage of approximately 600 cc. He did require intubation through 01/22/2019.  Maintained on broad-spectrum antibiotics for suspected pneumonia.  Hospital course complicated by AKI with follow-up per renal services circulatory shock requiring vasopressors.  On 01/19/2019 he was found to have right brachial vein DVT and started on heparin.  He subsequently developed GI bleed and underwent EGD on 01/21/2019, which showed adherent clot friable mucosa distal gastric body area was clipped per Dr. Havery Moros.  His anticoagulation was initially discontinued due to high risk of bleeding.  To date patient received 6 units of packed red blood cells, 1 unit of platelets and 3 units of fresh frozen plasma.  Hemoglobin stabilized patient was transitioned to Eliquis for DVT and latest hemoglobin 7.6.  Patient was found to  have a lytic rib lesion concerning for possible malignancy.  A multiple myeloma panel and bone marrow biopsy were performed with follow-up per oncology services Dr. Marin Olp.  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  Plan would be follow-up outpatient with oncology services.  Patient's creatinine 6.50-4.61 and hemodialysis has been initiated plan long-term hemodialysis Tuesday Thursday Saturday schedule.  Patient with right IJ tunneled catheter and left brachiocephalic AV fistula has been placed.  Patient with episodic SVT 02/08/2019 initially on metoprolol discontinued started on amiodarone loaded 200 mg twice daily changed to 100 mg twice daily 02/10/2019.  Patient with reportedly multi-decubiti sacrum and heels. Prevalon boots in place and frequent turning to maintain skin integrity.  Therapy evaluations completed and patient was admitted for a comprehensive rehab program.  Please see preadmission assessment from earlier today as well.  Review of Systems  Constitutional: Positive for malaise/fatigue. Negative for chills and fever.  HENT: Negative for hearing loss.   Eyes: Negative for blurred vision and double vision.  Respiratory: Positive for shortness of breath.   Cardiovascular: Positive for leg swelling. Negative for chest pain and palpitations.  Gastrointestinal: Positive for constipation. Negative for heartburn, nausea and vomiting.  Genitourinary: Positive for urgency. Negative for dysuria, flank pain and hematuria.  Musculoskeletal: Positive for joint pain and myalgias.  Skin: Negative for rash.  Neurological: Positive for weakness.  All other systems reviewed and are negative.  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 PCCM intubating patient and he will be ready for EGD by 10: 30-10:40am   ESOPHAGOGASTRODUODENOSCOPY (EGD) WITH PROPOFOL N/A 01/31/2019   Procedure: ESOPHAGOGASTRODUODENOSCOPY (EGD) WITH PROPOFOL;  Surgeon: Juanita Craver, MD;  Location: [facility] ENDOSCOPY;  Service: Endoscopy;  Laterality: N/A;   HEMOSTASIS CLIP PLACEMENT  01/21/2019   Procedure: HEMOSTASIS CLIP PLACEMENT;  Surgeon: Yetta Flock, MD;  Location: [facility] ENDOSCOPY;  Service: Gastroenterology;;   HEMOSTASIS CONTROL  01/21/2019   Procedure: HEMOSTASIS CONTROL;  Surgeon: Yetta Flock, MD;  Location: [facility] ENDOSCOPY;  Service: Gastroenterology;;  Girtha Rm Probe   INSERTION OF DIALYSIS CATHETER Right 02/05/2019   Procedure: INSERTION OF DIALYSIS CATHETER RIGHT INTERNAL JUGULAR;  Surgeon: Elam Dutch, MD;  Location: [facility];  Service: Vascular;  Laterality: Right;   IR FLUORO GUIDE CV LINE RIGHT  01/20/2019   IR US GUIDE VASC ACCESS RIGHT  01/20/2019   SCLEROTHERAPY  01/21/2019   Procedure: Clide Deutscher;  Surgeon: Yetta Flock, MD;  Location: [facility] ENDOSCOPY;  Service: Gastroenterology;;   Family History  Problem Relation Age of Onset   Cancer Mother    Parkinson's disease Father    Social History:  reports that he has quit smoking. His smoking use included cigarettes. He has never used smokeless tobacco. He reports current alcohol use of about 5.0 standard drinks of alcohol per week. No history on file for drug. Allergies: No Known Allergies Medications Prior to Admission   Medication Sig Dispense Refill   aspirin 81 MG chewable tablet Chew 81 mg by mouth daily.     atorvastatin (LIPITOR) 10 MG tablet Take 10 mg by mouth daily.     ramipril (ALTACE) 2.5 MG capsule Take 2.5 mg by mouth daily.      Drug Regimen Review Drug regimen was reviewed and remains appropriate with no significant issues identified  Home: Home Living Family/patient expects to be discharged to:: Unsure Living Arrangements: Spouse/significant other Available Help at Discharge: Family, Available 24 hours/day Type of Home: House Home Access: Stairs to enter [employer] of Steps: 3 (uses door/frame) to hold on to Entrance Stairs-Rails: None Home Layout: Two level, Able to live on main level with bedroom/bathroom Bathroom Shower/Tub: Tub/shower unit, Architectural technologist: Standard Bathroom Accessibility: Yes Home Equipment: Environmental consultant - 2 wheels, Cane - single point  Lives With: Spouse   Functional History: Prior Function Level of Independence: Independent with assistive device(s) Comments: uses RW vs cane PRN. "On a good day" pt can ambulate short distances unassisted.   Was able to write and use R hand functionally  Functional Status:  Mobility: Bed Mobility Overal bed mobility: Needs Assistance Bed Mobility: Supine to Sit Rolling: Mod assist Supine to sit: Mod assist Sit to supine: Mod assist General bed mobility comments: mod A for trunk and LEs Transfers Overall transfer level: Needs assistance Equipment used: Rolling walker (2 wheeled) Transfer via Lift Equipment: Maximove Transfers: Sit to/from Stand, Risk manager Sit to Stand: Max assist Stand pivot transfers: +2 physical assistance, Min assist General transfer comment: pt performed sit <> stand from elevated bed with max A  with LT UE pulling up on RW.  once standing pt able to stand with min A 2 x 3 minutes for performing cleaning and hygiene Ambulation/Gait Ambulation/Gait assistance: Min  assist, +2 safety/equipment Gait Distance (Feet): 5 Feet Assistive device: Rolling walker (2 wheeled) Gait Pattern/deviations: Step-to pattern General Gait Details: pt declining ambulation anxiously awaiting a phone call Gait velocity: decr    ADL: ADL Overall ADL's : Needs assistance/impaired Eating/Feeding: Set up, Sitting Eating/Feeding Details (indicate cue type and reason): pt required assistance with removing lids Grooming: Set up, Sitting Upper Body Bathing: Minimal assistance, Sitting Upper Body Bathing Details (indicate cue type and reason): minA to wash LUE;limited functional use of RUE Lower Body Bathing: Total assistance, +2 for physical assistance, Sit to/from stand(pt 10%) Upper Body Dressing : Moderate assistance, Maximal assistance Lower Body Dressing: Total assistance, +2 for physical assistance, Sit to/from stand Toilet Transfer: Moderate assistance, +2 for physical assistance, Stand-pivot, Control and instrumentation engineer Details (indicate cue type and reason): pt decined ADL tasks this session due to fixation on waiting for an "important, private phone call" [place] and Hygiene: Maximal assistance, Moderate assistance Toileting - Clothing Manipulation Details (indicate cue type and reason): pt rolled R<>L for pericare Functional mobility during ADLs: Moderate assistance, +2 for physical assistance General ADL Comments: pt completed ADL while sitting in supported seat;use of maximove to get pt to chair to maximize pt's participation during session  Cognition: Cognition Overall Cognitive Status: Within Functional Limits for tasks assessed Orientation Level: Oriented X4 Cognition Arousal/Alertness: Awake/alert Behavior During Therapy: WFL for tasks assessed/performed Overall Cognitive Status: Within Functional Limits for tasks assessed  Physical Exam: Blood pressure (!) 116/47, pulse 86, temperature 98.6 F (37 C), temperature source Oral,  resp. rate 20, height '6\' 2"'  (1.88 m), weight 106.1 kg, SpO2 95 %. Physical Exam  Vitals reviewed. Constitutional: He appears well-developed.  Obese  HENT:  Head: Normocephalic and atraumatic.  Eyes: EOM are normal. Right eye exhibits no discharge. Left eye exhibits no discharge.  Respiratory: Effort normal. No respiratory distress.  GI: He exhibits no distension.  Musculoskeletal:     Comments: Generalized edema  Neurological: He is alert.  Mood is a bit flat but appropriate. Follows commands Motor: Left upper extremity: 5/5 proximal distal Left lower extremity: 4-4+/5 proximal distal Right upper extremity: 3-/5 proximal distal Right lower extremity: 2+/5 proximal to distal  Skin: Skin is warm and dry.  Sacral ulcer not examined  Psychiatric: He has a normal mood and affect. His behavior is normal.    Results for orders placed or performed during the hospital encounter of 01/14/19 (from the past 48 hour(s))  Renal function panel     Status: Abnormal   Collection Time: 02/09/19  6:01 AM  Result Value Ref Range   Sodium 136 135 - 145 mmol/L   Potassium 5.2 (H) 3.5 - 5.1 mmol/L   Chloride 100 98 - 111 mmol/L   CO2 23 22 - 32 mmol/L   Glucose, Bld 100 (H) 70 - 99 mg/dL   BUN 77 (H) 8 - 23 mg/dL   Creatinine, Ser 6.82 (H) 0.61 - 1.24 mg/dL   Calcium 8.9 8.9 - 10.3 mg/dL   Phosphorus 4.7 (H) 2.5 - 4.6 mg/dL   Albumin 3.3 (L) 3.5 - 5.0 g/dL   GFR calc non Af Amer 7 (L) >60 mL/min   GFR calc Af Amer 8 (L) >60 mL/min   Anion gap 13 5 - 15    Comment: Performed at [facility]  Lab, [address], [place], [place] [address]  CBC with Differential/Platelet     Status: Abnormal   Collection Time: 02/09/19  6:01 AM  Result Value Ref Range   WBC 7.7 4.0 - 10.5 K/uL   RBC 2.33 (L) 4.22 - 5.81 MIL/uL   Hemoglobin 7.6 (L) 13.0 - 17.0 g/dL   HCT 23.3 (L) 39.0 - 52.0 %   MCV 100.0 80.0 - 100.0 fL   MCH 32.6 26.0 - 34.0 pg   MCHC 32.6 30.0 - 36.0 g/dL   RDW 19.2 (H) 11.5 - 15.5 %    Platelets PLATELET CLUMPS NOTED ON SMEAR, UNABLE TO ESTIMATE 150 - 400 K/uL   nRBC 0.5 (H) 0.0 - 0.2 %   Neutrophils Relative % 72 %   Neutro Abs 5.5 1.7 - 7.7 K/uL   Lymphocytes Relative 13 %   Lymphs Abs 1.0 0.7 - 4.0 K/uL   Monocytes Relative 8 %   Monocytes Absolute 0.6 0.1 - 1.0 K/uL   Eosinophils Relative 1 %   Eosinophils Absolute 0.0 0.0 - 0.5 K/uL   Basophils Relative 0 %   Basophils Absolute 0.0 0.0 - 0.1 K/uL   Immature Granulocytes 6 %   Abs Immature Granulocytes 0.48 (H) 0.00 - 0.07 K/uL   Polychromasia PRESENT    Ovalocytes PRESENT     Comment: Performed at [facility], [place] [address] [place], [place] [address]  Hepatic function panel     Status: Abnormal   Collection Time: 02/09/19  6:01 AM  Result Value Ref Range   Total Protein 5.6 (L) 6.5 - 8.1 g/dL   Albumin 3.3 (L) 3.5 - 5.0 g/dL   AST 30 15 - 41 U/L   ALT 17 0 - 44 U/L   Alkaline Phosphatase 66 38 - 126 U/L   Total Bilirubin 0.9 0.3 - 1.2 mg/dL   Bilirubin, Direct 0.3 (H) 0.0 - 0.2 mg/dL   Indirect Bilirubin 0.6 0.3 - 0.9 mg/dL    Comment: Performed at [facility] [address], [place], [place] [address]  Magnesium     Status: None   Collection Time: 02/09/19  6:01 AM  Result Value Ref Range   Magnesium 2.3 1.7 - 2.4 mg/dL    Comment: Performed at [facility] [address], [place], [place] [address]  Renal function panel     Status: Abnormal   Collection Time: 02/10/19  5:42 AM  Result Value Ref Range   Sodium 136 135 - 145 mmol/L   Potassium 4.3 3.5 - 5.1 mmol/L   Chloride 97 (L) 98 - 111 mmol/L   CO2 26 22 - 32 mmol/L   Glucose, Bld 109 (H) 70 - 99 mg/dL   BUN 40 (H) 8 - 23 mg/dL   Creatinine, Ser 4.28 (H) 0.61 - 1.24 mg/dL    Comment: DELTA CHECK NOTED CONSISTENT WITH PREVIOUS RESULT    Calcium 9.1 8.9 - 10.3 mg/dL   Phosphorus 3.4 2.5 - 4.6 mg/dL   Albumin 3.3 (L) 3.5 - 5.0 g/dL   GFR calc non Af Amer 12 (L) >60 mL/min   GFR calc Af Amer 14 (L) >60 mL/min   Anion  gap 13 5 - 15    Comment: Performed at [facility] [address], [place], [place] [address]  CBC with Differential/Platelet     Status: Abnormal (Preliminary result)   Collection Time: 02/10/19  5:42 AM  Result Value Ref Range   WBC 6.6  4.0 - 10.5 K/uL   RBC 2.35 (L) 4.22 - 5.81 MIL/uL   Hemoglobin 7.7 (L) 13.0 - 17.0 g/dL   HCT 24.2 (L) 39.0 - 52.0 %   MCV 103.0 (H) 80.0 - 100.0 fL   MCH 32.8 26.0 - 34.0 pg   MCHC 31.8 30.0 - 36.0 g/dL   RDW 19.4 (H) 11.5 - 15.5 %   Platelets 185 150 - 400 K/uL   nRBC 0.8 (H) 0.0 - 0.2 %    Comment: Performed at [facility] [address] [place], [place] [address]   Neutrophils Relative % PENDING %   Neutro Abs PENDING 1.7 - 7.7 K/uL   Band Neutrophils PENDING %   Lymphocytes Relative PENDING %   Lymphs Abs PENDING 0.7 - 4.0 K/uL   Monocytes Relative PENDING %   Monocytes Absolute PENDING 0.1 - 1.0 K/uL   Eosinophils Relative PENDING %   Eosinophils Absolute PENDING 0.0 - 0.5 K/uL   Basophils Relative PENDING %   Basophils Absolute PENDING 0.0 - 0.1 K/uL   WBC Morphology PENDING    RBC Morphology PENDING    Smear Review PENDING    Other PENDING %   nRBC PENDING 0 /100 WBC   Metamyelocytes Relative PENDING %   Myelocytes PENDING %   Promyelocytes Relative PENDING %   Blasts PENDING %   No results found.  Medical Problem List and Plan: 1.  Debility secondary to acute renal failure/ascites/multi-medical  Begin CIR evaluations 2.  Antithrombotics: Right brachiocephalic DVT 4/0/1027 -DVT/anticoagulation: Eliquis  -antiplatelet therapy: N/A 3. Pain Management: Hydrocodone as needed 4. Mood: Provide emotional support  -antipsychotic agents: N/A 5. Neuropsych: This patient is capable of making decisions on his own behalf. 6. Skin/Wound Care/multiple decubiti sacrum/heels.:  Prevalon boots and follow-up wound care nurse routine skin checks 7. Fluids/Electrolytes/Nutrition: Routine in and outs.  Follow-up BMP tomorrow  a.m. 8.  GI bleed.  EGD 01/21/2019.  Follow-up gastroenterology recommendations.  Follow-up CBC ordered for tomorrow a.m. 9.  ESRD.  Status post left brachiocephalic AV fistula.  Hemodialysis Tuesday Thursday Saturday.  Recommendation per nephro. 10.  Plasma cell myeloma.  Outpatient follow-up with oncology to discuss formal chemotherapy 11.  Right pleural effusion.  Status post thoracentesis 01/16/2019. 12.  Orthostasis.  ProAmatine 10 mg 3 times daily.  Monitor with increased mobility. 13.  Episodic SVT.  8 beats noted 02/08/2019.  Amiodarone as directed.  Monitor with increased mobility. 13.  History of CVA with recurrent right hemiparesis.  Quintan Saldivar Angiulli, PA-C 02/10/2019

## 2019-02-08 NOTE — Progress Notes (Signed)
[facility] PROGRESS NOTE  Scott Mcdowell RRN:[id number] DOB: 07-Nov-1940 DOA: 01/14/2019 PCP: [name], MD   78 year old Caucasian male Previous right-sided CVA Admitted 01/15/2019 -Pneumonia right pleural effusion AKI + hyperkalemia, hypercalcemia--- also on admission found to have bilateral hydrocele Recent right brachial vein venous thrombosis 01/19/2019 on anticoagulation  Transferred to ICU 2/2 GI bleed, hypovolemic shock-placed on Neo-Synephrine on 6/3-urgent endoscopy performed = adherent clot distal body gastric area Rx hemostasis clip, epinephrine  2/2 persistent azotemia-myeloma work-up performed-CRRT performed on ICU Vascular consulted-tunneled dialysis cath placed 6/20 Myeloma panel + bone marrow biopsy 6/9 showing light chain myeloma 90% [lambda] prompted oncology input  He has stabilized overall and is currently clipped for dialysis and awaiting CIR placement  A & Plan Right-sided pneumonia/effusion Resolved early in hospital stay Periodic chest x-ray Massive GI bleed, hypovolemic shock Anemia of critical illness superimposed on anemia of renal disease Bimodal anemia  Hemodynamics good-status post 8 unit PRBC, 1 unit platelets, 3 units FFP Hb stable in 7-8 range-last saturations 6/13 iron 41, saturation 13-per nephrology/oncology when to replace--- getting Aranesp 200 q. Tuesday HD Lambda chain myeloma c ESRD-  Left-hand AV fistula and right chest dialysis access Continue Decadron-poor ECOG status-outpatient follow-up with Dr. Marin Olp PLT have improved ESRD-patient has left hand AV fistula and right chest dialysis access Mild hyperkalemia Secondary hyperparathyroidism Outpatient follow-up with vascular on Midodrine to support blood pressure 10 x 3 times daily Continue Renvela 2.4 3 times daily Right brachiocephalic DVT diagnosed 10/20/8327 Continue Eliquis and transition on 6/24 to lower dosing  Episodic SVT 8 bts today--check am mag and LFT Start low  dose metoprolol 12.5 bid --------> watch BP CVA 2004 affecting right side with dense hemiparesis Will be going to CIR-off aspirin 81 at this time--hesitate to add ASA in setting of massive GI bleed HTN Holding ACE inhibitor Multiple decubiti sacrum/heels Keep Prevalon boots on Wound care input and frequent turning   Currently on Eliquis Full code Inpatient Patient is insistent on updating his wife himself--- social work to follow-up in a.m. regarding plans for [employer], MD  [facility] Via [employer] app OR -[email or web address] 7PM-7AM contact night coverage as above 02/08/2019, 4:02 PM  LOS: 25 days   Consultants:  Nephrology, vascular surgery  Procedures:  Fistula placement 6/19  Antimicrobials:  None currently  Interval history/Subjective:  Awake alert pleasant sitting up Nursing informed me of 8 beats of V. tach which is confirmed on monitors No chest pain No fever Not symptomatic with this. has not stooled so added laxative today  Objective:  Vitals:  Vitals:   02/08/19 0533 02/08/19 0950  BP: (!) 106/44 (!) 114/46  Pulse: 78 83  Resp: 19 18  Temp: 97.7 F (36.5 C) (!) 97.5 F (36.4 C)  SpO2: 96% 98%    Exam:  External ocular movements intact flat affect dense hemiparesis right side left upper extremity fistula noted good thrill S1-S2 mild tachycardia seems sinus however abdomen soft nontender No lower extremity edema Right arm is pretty swollen compared to the left Lower extremity edema Chest is clear posterolaterally Neck is soft  I have personally reviewed the following:  DATA  Labs:   Potassium 5.0 BUN/creatinine 59/5.8  INR 1.2  Imaging studies:  None currently  Medical tests:  No  Scheduled Meds: . apixaban  10 mg Oral BID   Followed by  . [START ON 02/10/2019] apixaban  5 mg Oral BID  . chlorhexidine gluconate (MEDLINE KIT)  15 mL Mouth Rinse BID  .  Chlorhexidine Gluconate Cloth  6 each Topical Q0600  . darbepoetin  (ARANESP) injection - DIALYSIS  200 mcg Intravenous Q Tue-HD  . feeding supplement (NEPRO CARB STEADY)  237 mL Oral TID BM  . mouth rinse  15 mL Mouth Rinse q12n4p  . midodrine  10 mg Oral TID WC  . multivitamin  1 tablet Oral QHS  . nystatin   Topical TID  . pantoprazole  40 mg Oral Daily  . polyethylene glycol  17 g Oral Daily  . sevelamer carbonate  2.4 g Oral TID WC   Continuous Infusions: . sodium chloride Stopped (02/03/19 1409)    Principal Problem:   Shock ([place]) Active Problems:   ARF (acute renal failure) (HCC)   Hyperkalemia   B12 deficiency anemia   Pleural effusion on right   Ascites   CAP (community acquired pneumonia)   Elevated troponin   Acute blood loss anemia   Upper GI bleed   Goals of care, counseling/discussion   Lambda light chain myeloma ([facility])   Pressure injury of skin   LOS: 25 days

## 2019-02-09 LAB — RENAL FUNCTION PANEL
Albumin: 3.3 g/dL — ABNORMAL LOW (ref 3.5–5.0)
Anion gap: 13 (ref 5–15)
BUN: 77 mg/dL — ABNORMAL HIGH (ref 8–23)
CO2: 23 mmol/L (ref 22–32)
Calcium: 8.9 mg/dL (ref 8.9–10.3)
Chloride: 100 mmol/L (ref 98–111)
Creatinine, Ser: 6.82 mg/dL — ABNORMAL HIGH (ref 0.61–1.24)
GFR calc Af Amer: 8 mL/min — ABNORMAL LOW (ref >60)
GFR calc non Af Amer: 7 mL/min — ABNORMAL LOW (ref >60)
Glucose, Bld: 100 mg/dL — ABNORMAL HIGH (ref 70–99)
Phosphorus: 4.7 mg/dL — ABNORMAL HIGH (ref 2.5–4.6)
Potassium: 5.2 mmol/L — ABNORMAL HIGH (ref 3.5–5.1)
Sodium: 136 mmol/L (ref 135–145)

## 2019-02-09 LAB — CBC WITH DIFFERENTIAL/PLATELET
Abs Immature Granulocytes: 0.48 10*3/uL — ABNORMAL HIGH (ref 0.00–0.07)
Basophils Absolute: 0 10*3/uL (ref 0.0–0.1)
Basophils Relative: 0 %
Eosinophils Absolute: 0 10*3/uL (ref 0.0–0.5)
Eosinophils Relative: 1 %
HCT: 23.3 % — ABNORMAL LOW (ref 39.0–52.0)
Hemoglobin: 7.6 g/dL — ABNORMAL LOW (ref 13.0–17.0)
Immature Granulocytes: 6 %
Lymphocytes Relative: 13 %
Lymphs Abs: 1 10*3/uL (ref 0.7–4.0)
MCH: 32.6 pg (ref 26.0–34.0)
MCHC: 32.6 g/dL (ref 30.0–36.0)
MCV: 100 fL (ref 80.0–100.0)
Monocytes Absolute: 0.6 10*3/uL (ref 0.1–1.0)
Monocytes Relative: 8 %
Neutro Abs: 5.5 10*3/uL (ref 1.7–7.7)
Neutrophils Relative %: 72 %
Platelets: UNDETERMINED 10*3/uL (ref 150–400)
RBC: 2.33 MIL/uL — ABNORMAL LOW (ref 4.22–5.81)
RDW: 19.2 % — ABNORMAL HIGH (ref 11.5–15.5)
WBC: 7.7 10*3/uL (ref 4.0–10.5)
nRBC: 0.5 % — ABNORMAL HIGH (ref 0.0–0.2)

## 2019-02-09 LAB — HEPATIC FUNCTION PANEL
ALT: 17 U/L (ref 0–44)
AST: 30 U/L (ref 15–41)
Albumin: 3.3 g/dL — ABNORMAL LOW (ref 3.5–5.0)
Alkaline Phosphatase: 66 U/L (ref 38–126)
Bilirubin, Direct: 0.3 mg/dL — ABNORMAL HIGH (ref 0.0–0.2)
Indirect Bilirubin: 0.6 mg/dL (ref 0.3–0.9)
Total Bilirubin: 0.9 mg/dL (ref 0.3–1.2)
Total Protein: 5.6 g/dL — ABNORMAL LOW (ref 6.5–8.1)

## 2019-02-09 LAB — MAGNESIUM: Magnesium: 2.3 mg/dL (ref 1.7–2.4)

## 2019-02-09 MED ORDER — MIDODRINE HCL 5 MG PO TABS
ORAL_TABLET | ORAL | Status: AC
  Filled 2019-02-09: qty 2

## 2019-02-09 MED ORDER — AMIODARONE HCL 200 MG PO TABS
200.0000 mg | ORAL_TABLET | Freq: Two times a day (BID) | ORAL | Status: DC
  Administered 2019-02-09 (×2): 200 mg via ORAL
  Filled 2019-02-09 (×2): qty 1

## 2019-02-09 MED ORDER — DARBEPOETIN ALFA 200 MCG/0.4ML IJ SOSY
PREFILLED_SYRINGE | INTRAMUSCULAR | Status: AC
  Filled 2019-02-09: qty 0.4

## 2019-02-09 MED ORDER — AMIODARONE HCL 100 MG PO TABS
100.0000 mg | ORAL_TABLET | Freq: Two times a day (BID) | ORAL | Status: DC
  Administered 2019-02-10: 100 mg via ORAL
  Filled 2019-02-09: qty 1

## 2019-02-09 MED ORDER — HEPARIN SODIUM (PORCINE) 1000 UNIT/ML IJ SOLN
INTRAMUSCULAR | Status: AC
  Filled 2019-02-09: qty 4

## 2019-02-09 NOTE — Progress Notes (Signed)
[facility]    NEPHROLOGY PROGRESS NOTE  SUBJECTIVE:  Seen on HD at 11:20 am.  BP had dropped to 70's and UF discontinued.  BP now 107/34 and HR 88.  Given midodrine and gently UF as tolerated.  Last HD on 6/20 with 2.9 kg UF.  He has had episodic SVT which primary team is managing and has just initiated amio.   Review of systems:  Reports shortness of breath Denies nausea or vomiting    OBJECTIVE:  Vitals:   02/09/19 1040 02/09/19 1047  BP: (!) 110/43 (!) 108/37  Pulse: 87 86  Resp: (!) 23   Temp: 97.9 F (36.6 C)   SpO2: 95%     Intake/Output Summary (Last 24 hours) at 02/09/2019 1109 Last data filed at 02/09/2019 0900 Gross per 24 hour  Intake 900 ml  Output -  Net 900 ml      General:  AAOx3 NAD on HD HEENT: MMM [place] AT anicteric sclera Neck:  No JVD, no adenopathy CV:  RRR on exam  Lungs:  Clear anteriorly unlabored  Abd:  abd SNT/ND with normal BS GU:  Bladder non-palpable Extremities: 2-3+ bilateral lower extremity edema Access:  RIJ tunneled catheter; LUE AVF with bruit and thrill    MEDICATIONS:  . amiodarone  200 mg Oral BID  . apixaban  10 mg Oral BID   Followed by  . [START ON 02/10/2019] apixaban  5 mg Oral BID  . chlorhexidine gluconate (MEDLINE KIT)  15 mL Mouth Rinse BID  . Chlorhexidine Gluconate Cloth  6 each Topical Q0600  . darbepoetin (ARANESP) injection - DIALYSIS  200 mcg Intravenous Q Tue-HD  . feeding supplement (NEPRO CARB STEADY)  237 mL Oral TID BM  . mouth rinse  15 mL Mouth Rinse q12n4p  . midodrine  10 mg Oral TID WC  . multivitamin  1 tablet Oral QHS  . nystatin   Topical TID  . pantoprazole  40 mg Oral Daily  . polyethylene glycol  17 g Oral Daily  . senna-docusate  1 tablet Oral QHS  . sevelamer carbonate  2.4 g Oral TID WC       LABS:   CBC Latest Ref Rng & Units 02/09/2019 02/06/2019 02/05/2019  WBC 4.0 - 10.5 K/uL 7.7 8.1 8.7  Hemoglobin 13.0 - 17.0 g/dL 7.6(L) 7.2(L) 7.2(L)  Hematocrit 39.0 - 52.0 %  23.3(L) 23.2(L) 23.3(L)  Platelets 150 - 400 K/uL PLATELET CLUMPS NOTED ON SMEAR, UNABLE TO ESTIMATE 169 164    CMP Latest Ref Rng & Units 02/09/2019 02/08/2019 02/07/2019  Glucose 70 - 99 mg/dL 100(H) 104(H) 125(H)  BUN 8 - 23 mg/dL 77(H) 59(H) 44(H)  Creatinine 0.61 - 1.24 mg/dL 6.82(H) 5.84(H) 4.61(H)  Sodium 135 - 145 mmol/L 136 135 137  Potassium 3.5 - 5.1 mmol/L 5.2(H) 4.8 5.0  Chloride 98 - 111 mmol/L 100 99 100  CO2 22 - 32 mmol/L _0 Calcium 8.9 - 10.3 mg/dL 8.9 8.9 8.8(L)  Total Protein 6.5 - 8.1 g/dL 5.6(L) - -  Total Bilirubin 0.3 - 1.2 mg/dL 0.9 - -  Alkaline Phos 38 - 126 U/L 66 - -  AST 15 - 41 U/L 30 - -  ALT 0 - 44 U/L 17 - -    Lab Results  Component Value Date   PTH 40 02/02/2019   PTH Comment 02/02/2019   CALCIUM 8.9 02/09/2019   PHOS 4.7 (H) 02/09/2019       Component Value Date/Time  COLORURINE YELLOW 01/17/2019 1029   APPEARANCEUR HAZY (A) 01/17/2019 1029   LABSPEC 1.012 01/17/2019 1029   PHURINE 5.0 01/17/2019 1029   GLUCOSEU NEGATIVE 01/17/2019 1029   HGBUR NEGATIVE 01/17/2019 [place] 01/17/2019 1029   KETONESUR 5 (A) 01/17/2019 1029   PROTEINUR NEGATIVE 01/17/2019 1029   NITRITE NEGATIVE 01/17/2019 1029   LEUKOCYTESUR NEGATIVE 01/17/2019 1029      Component Value Date/Time   PHART 7.414 01/22/2019 0839   PCO2ART 36.8 01/22/2019 0839   PO2ART 104 01/22/2019 0839   HCO3 23.1 01/22/2019 0839   ACIDBASEDEF 0.8 01/22/2019 0839   O2SAT 97.8 01/22/2019 0839       Component Value Date/Time   IRON 41 (L) 01/30/2019 0308   TIBC 305 01/30/2019 0308   FERRITIN 724 (H) 01/30/2019 0308   IRONPCTSAT 13 (L) 01/30/2019 0308       ASSESSMENT/PLAN:    78 year old male patient who presented with pneumonia complicated by acute kidney injury requiring renal replacement therapy.  He is dialyzing via a tunneled right IJ catheter.  He had subsequent respiratory failure requiring intubation, and acute blood loss anemia from upper GI  bleeding.  He also had a right upper extremity DVT requiring anticoagulation.  1.  Acute kidney injury.  May be dialysis dependent at this point.   - HD today per TTS schedule  - He is dialyzing via a right IJ tunnel catheter.   - He has a left brachiocephalic AV fistula in place.  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  Will clarify plans for outpatient HD.  Currently planning to go to rehab at [facility]    2.  Chronic kidney disease stage IV.  His serum creatinine was 2.4 on admission with concern for progression as above.   3.  Pneumonia.  Resolved.  4.  Plasma cell myeloma.  Per oncology.  5.  Right upper extremity DVT.  On eliquis per primary team   6. Anemia 2/2 CKD  - no acute indication for PRBC's.  On aranesp

## 2019-02-09 NOTE — Plan of Care (Signed)
°  Problem: Health Behavior/Discharge Planning: °Goal: Ability to manage health-related needs will improve °Outcome: Progressing °  °Problem: Skin Integrity: °Goal: Risk for impaired skin integrity will decrease °Outcome: Progressing °  °

## 2019-02-09 NOTE — Progress Notes (Signed)
Inpatient Rehabilitation Admissions Coordinator  We have insurance approval to admit pt to inpt rehab since yesterday. I met with patient and he is in agreement to admit. I contacted Dr. Verlon Au and he states pt is not medically ready to d/c to CIR today due to his SVT. I will follow up with patient who is now in hemodialysis and insurance to assist with planning dispo when pt medically ready for d/c.  Danne Baxter, RN, MSN Rehab Admissions Coordinator [phone] 02/09/2019 11:49 AM

## 2019-02-09 NOTE — Progress Notes (Signed)
[facility] PROGRESS NOTE  Scott Mcdowell IOE:[id number] DOB: 1941-05-04 DOA: 01/14/2019 PCP: Scott Sanes, MD   78 year old Caucasian male Previous right-sided CVA Admitted 01/15/2019 -Pneumonia right pleural effusion AKI + hyperkalemia, hypercalcemia--- also on admission found to have bilateral hydrocele Recent right brachial vein venous thrombosis 01/19/2019 on anticoagulation  Transferred to ICU 2/2 GI bleed, hypovolemic shock-placed on Neo-Synephrine on 6/3-urgent endoscopy performed = adherent clot distal body gastric area Rx hemostasis clip, epinephrine  2/2 persistent azotemia-myeloma work-up performed-CRRT performed on ICU Vascular consulted-tunneled dialysis cath placed 6/20 Myeloma panel + bone marrow biopsy 6/9 showing light chain myeloma 90% [lambda] prompted oncology input  He has stabilized overall and is currently clipped for dialysis and awaiting CIR placement  A & Plan Right-sided pneumonia/effusion Resolved early in hospital stay Periodic chest x-ray Massive GI bleed, hypovolemic shock Anemia of critical illness superimposed on anemia of renal disease Bimodal anemia  Stable now-status post 8 unit PRBC, 1 unit platelets, 3 units FFP Hb stable in 7-8 range- Aranesp 200 q. Tuesday HD--recheck saturations in a month Lambda chain myeloma c ESRD-  Left-hand AV fistula and right chest dialysis access Continue Decadron-poor ECOG status-outpatient follow-up with Scott Mcdowell PLT have improved ESRD-patient has left hand AV fistula and right chest dialysis access Mild hyperkalemia Secondary hyperparathyroidism Outpatient follow-up with vascular  Midodrine to support BP 10 x 3 times daily Continue Renvela 2.4 3 times daily Right brachiocephalic DVT diagnosed 08/27/2991 Continue Eliquis and transition on 6/24 to lower dosing  Episodic SVT 8 bts noted 6/22-magnesium 2.3 at that time BP 80 systolic, metoprolol D/C, started amiodarone as a load 200 twice daily  today -->100 twice daily from tomorrow LFT, TSH in 3 weeks-may be able to come off the same CVA 2004 affecting right side with dense hemiparesis Will be going to CIR-off aspirin 81 at this time--hesitate to add ASA in setting of massive GI bleed HTN Holding ACE inhibitor Multiple decubiti sacrum/heels Keep Prevalon boots on Wound care input and frequent turning   Currently on Eliquis Full code Inpatient I spoke to wife on 6/22 and gave her full update --Pending CIR placement dependent on recurrence of SVT-possibly can discharge a.m. if all stable and insurance approves   Scott Closson, MD  [facility] Via [employer] app OR [email or web address] 7PM-7AM contact night coverage as above 02/09/2019, 4:30 PM  LOS: 26 days   Consultants:  Nephrology, vascular surgery  Procedures:  Fistula placement 6/19  Antimicrobials:  None currently  Interval history/Subjective:  Seen on HD unit No complaints Still overloaded No chest pain No fever no chills  Objective:  Vitals:  Vitals:   02/09/19 1400 02/09/19 1430  BP: (!) 88/36 (!) 82/44  Pulse: 87 84  Resp:    Temp:    SpO2:      Exam:  EOMI NCAT thick beard no JVD Neck soft supple poor dentition S1-S2 no murmur seems to be in sinus however on review of monitors episodic V. tach 3-4 beats today Abdomen soft no rebound Dense plegia and right lower extremity mild hemiparesis right upper which is unchanged from prior Chest is clinically clear no added sound  I have personally reviewed the following:  DATA  Labs:   Potassium 5.0-->5.2  Albumin 3.3 total protein 5.6 direct bili 0.3 total 0.9  Hemoglobin up from 7.2-7.6  Platelet clumps noted  Imaging studies:  None currently  Medical tests:  No  Scheduled Meds: . amiodarone  200 mg Oral BID  . apixaban  10 mg Oral BID  Followed by  . [START ON 02/10/2019] apixaban  5 mg Oral BID  . chlorhexidine gluconate (MEDLINE KIT)  15 mL Mouth Rinse BID  .  Chlorhexidine Gluconate Cloth  6 each Topical Q0600  . darbepoetin (ARANESP) injection - DIALYSIS  200 mcg Intravenous Q Tue-HD  . feeding supplement (NEPRO CARB STEADY)  237 mL Oral TID BM  . heparin      . mouth rinse  15 mL Mouth Rinse q12n4p  . midodrine  10 mg Oral TID WC  . multivitamin  1 tablet Oral QHS  . nystatin   Topical TID  . pantoprazole  40 mg Oral Daily  . polyethylene glycol  17 g Oral Daily  . senna-docusate  1 tablet Oral QHS  . sevelamer carbonate  2.4 g Oral TID WC   Continuous Infusions: . sodium chloride Stopped (02/03/19 1409)    Principal Problem:   Shock ([place]) Active Problems:   ARF (acute renal failure) (HCC)   Hyperkalemia   B12 deficiency anemia   Pleural effusion on right   Ascites   CAP (community acquired pneumonia)   Elevated troponin   Acute blood loss anemia   Upper GI bleed   Goals of care, counseling/discussion   Lambda light chain myeloma (HCC)   Pressure injury of skin   LOS: 26 days

## 2019-02-09 NOTE — Progress Notes (Signed)
OT Cancellation Note  Patient Details Name: Scott Mcdowell MRN: [id number] DOB: 29-May-1941   Cancelled Treatment:    Reason Eval/Treat Not Completed: Patient at procedure or test/ unavailable. RN reports pt is currently at dialysis. OT will check later for therapy session.   Minus Breeding, MSOT, OTR/L  Supplemental Rehabilitation Services  [phone]   Marius Ditch 02/09/2019, 1:36 PM

## 2019-02-10 ENCOUNTER — Encounter (HOSPITAL_COMMUNITY): Payer: Self-pay

## 2019-02-10 ENCOUNTER — Inpatient Hospital Stay (HOSPITAL_COMMUNITY)
Admission: RE | Admit: 2019-02-10 | Discharge: 2019-03-05 | DRG: 945 | Disposition: A | Discharge status: Home Health | Payer: Medicare Other | Source: Intra-hospital | Attending: Physical Medicine & Rehabilitation | Admitting: Physical Medicine & Rehabilitation

## 2019-02-10 ENCOUNTER — Other Ambulatory Visit: Payer: Self-pay

## 2019-02-10 DIAGNOSIS — C9000 Multiple myeloma not having achieved remission: Secondary | ICD-10-CM | POA: Diagnosis present

## 2019-02-10 DIAGNOSIS — N058 Unspecified nephritic syndrome with other morphologic changes: Secondary | ICD-10-CM | POA: Diagnosis not present

## 2019-02-10 DIAGNOSIS — I69351 Hemiplegia and hemiparesis following cerebral infarction affecting right dominant side: Secondary | ICD-10-CM

## 2019-02-10 DIAGNOSIS — R739 Hyperglycemia, unspecified: Secondary | ICD-10-CM

## 2019-02-10 DIAGNOSIS — Z7901 Long term (current) use of anticoagulants: Secondary | ICD-10-CM | POA: Diagnosis not present

## 2019-02-10 DIAGNOSIS — R600 Localized edema: Secondary | ICD-10-CM

## 2019-02-10 DIAGNOSIS — Z8679 Personal history of other diseases of the circulatory system: Secondary | ICD-10-CM | POA: Diagnosis not present

## 2019-02-10 DIAGNOSIS — R0989 Other specified symptoms and signs involving the circulatory and respiratory systems: Secondary | ICD-10-CM

## 2019-02-10 DIAGNOSIS — I953 Hypotension of hemodialysis: Secondary | ICD-10-CM | POA: Diagnosis not present

## 2019-02-10 DIAGNOSIS — N179 Acute kidney failure, unspecified: Secondary | ICD-10-CM

## 2019-02-10 DIAGNOSIS — R5381 Other malaise: Secondary | ICD-10-CM | POA: Diagnosis present

## 2019-02-10 DIAGNOSIS — I959 Hypotension, unspecified: Secondary | ICD-10-CM | POA: Diagnosis present

## 2019-02-10 DIAGNOSIS — Z8719 Personal history of other diseases of the digestive system: Secondary | ICD-10-CM

## 2019-02-10 DIAGNOSIS — Z992 Dependence on renal dialysis: Secondary | ICD-10-CM | POA: Diagnosis not present

## 2019-02-10 DIAGNOSIS — D62 Acute posthemorrhagic anemia: Secondary | ICD-10-CM | POA: Diagnosis present

## 2019-02-10 DIAGNOSIS — N186 End stage renal disease: Secondary | ICD-10-CM | POA: Diagnosis present

## 2019-02-10 DIAGNOSIS — I951 Orthostatic hypotension: Secondary | ICD-10-CM | POA: Diagnosis not present

## 2019-02-10 DIAGNOSIS — D631 Anemia in chronic kidney disease: Secondary | ICD-10-CM | POA: Diagnosis present

## 2019-02-10 DIAGNOSIS — I6930 Unspecified sequelae of cerebral infarction: Secondary | ICD-10-CM

## 2019-02-10 DIAGNOSIS — N2581 Secondary hyperparathyroidism of renal origin: Secondary | ICD-10-CM | POA: Diagnosis present

## 2019-02-10 DIAGNOSIS — I471 Supraventricular tachycardia: Secondary | ICD-10-CM

## 2019-02-10 DIAGNOSIS — Z87891 Personal history of nicotine dependence: Secondary | ICD-10-CM | POA: Diagnosis not present

## 2019-02-10 DIAGNOSIS — L89159 Pressure ulcer of sacral region, unspecified stage: Secondary | ICD-10-CM | POA: Diagnosis present

## 2019-02-10 DIAGNOSIS — K922 Gastrointestinal hemorrhage, unspecified: Secondary | ICD-10-CM

## 2019-02-10 DIAGNOSIS — Z79899 Other long term (current) drug therapy: Secondary | ICD-10-CM

## 2019-02-10 DIAGNOSIS — E8809 Other disorders of plasma-protein metabolism, not elsewhere classified: Secondary | ICD-10-CM

## 2019-02-10 DIAGNOSIS — J90 Pleural effusion, not elsewhere classified: Secondary | ICD-10-CM

## 2019-02-10 DIAGNOSIS — I120 Hypertensive chronic kidney disease with stage 5 chronic kidney disease or end stage renal disease: Secondary | ICD-10-CM | POA: Diagnosis present

## 2019-02-10 DIAGNOSIS — D7589 Other specified diseases of blood and blood-forming organs: Secondary | ICD-10-CM

## 2019-02-10 DIAGNOSIS — T380X5A Adverse effect of glucocorticoids and synthetic analogues, initial encounter: Secondary | ICD-10-CM | POA: Diagnosis present

## 2019-02-10 DIAGNOSIS — R609 Edema, unspecified: Secondary | ICD-10-CM

## 2019-02-10 DIAGNOSIS — N184 Chronic kidney disease, stage 4 (severe): Secondary | ICD-10-CM

## 2019-02-10 DIAGNOSIS — Z86718 Personal history of other venous thrombosis and embolism: Secondary | ICD-10-CM

## 2019-02-10 LAB — CBC WITH DIFFERENTIAL/PLATELET
Abs Immature Granulocytes: 0.38 10*3/uL — ABNORMAL HIGH (ref 0.00–0.07)
Basophils Absolute: 0 10*3/uL (ref 0.0–0.1)
Basophils Relative: 0 %
Eosinophils Absolute: 0 10*3/uL (ref 0.0–0.5)
Eosinophils Relative: 1 %
HCT: 24.2 % — ABNORMAL LOW (ref 39.0–52.0)
Hemoglobin: 7.7 g/dL — ABNORMAL LOW (ref 13.0–17.0)
Immature Granulocytes: 6 %
Lymphocytes Relative: 13 %
Lymphs Abs: 0.9 10*3/uL (ref 0.7–4.0)
MCH: 32.8 pg (ref 26.0–34.0)
MCHC: 31.8 g/dL (ref 30.0–36.0)
MCV: 103 fL — ABNORMAL HIGH (ref 80.0–100.0)
Monocytes Absolute: 0.7 10*3/uL (ref 0.1–1.0)
Monocytes Relative: 11 %
Neutro Abs: 4.6 10*3/uL (ref 1.7–7.7)
Neutrophils Relative %: 69 %
Platelets: 185 10*3/uL (ref 150–400)
RBC: 2.35 MIL/uL — ABNORMAL LOW (ref 4.22–5.81)
RDW: 19.4 % — ABNORMAL HIGH (ref 11.5–15.5)
WBC: 6.6 10*3/uL (ref 4.0–10.5)
nRBC: 0.8 % — ABNORMAL HIGH (ref 0.0–0.2)

## 2019-02-10 LAB — RENAL FUNCTION PANEL
Albumin: 3.3 g/dL — ABNORMAL LOW (ref 3.5–5.0)
Anion gap: 13 (ref 5–15)
BUN: 40 mg/dL — ABNORMAL HIGH (ref 8–23)
CO2: 26 mmol/L (ref 22–32)
Calcium: 9.1 mg/dL (ref 8.9–10.3)
Chloride: 97 mmol/L — ABNORMAL LOW (ref 98–111)
Creatinine, Ser: 4.28 mg/dL — ABNORMAL HIGH (ref 0.61–1.24)
GFR calc Af Amer: 14 mL/min — ABNORMAL LOW (ref >60)
GFR calc non Af Amer: 12 mL/min — ABNORMAL LOW (ref >60)
Glucose, Bld: 109 mg/dL — ABNORMAL HIGH (ref 70–99)
Phosphorus: 3.4 mg/dL (ref 2.5–4.6)
Potassium: 4.3 mmol/L (ref 3.5–5.1)
Sodium: 136 mmol/L (ref 135–145)

## 2019-02-10 MED ORDER — APIXABAN 5 MG PO TABS: 5.0000 mg | ORAL_TABLET | Freq: Two times a day (BID) | ORAL | Status: DC

## 2019-02-10 MED ORDER — MIDODRINE HCL 10 MG PO TABS: 10.0000 mg | ORAL_TABLET | Freq: Three times a day (TID) | ORAL | Status: DC

## 2019-02-10 MED ORDER — SENNOSIDES-DOCUSATE SODIUM 8.6-50 MG PO TABS: 1.0000 | ORAL_TABLET | Freq: Every day | ORAL | Status: DC

## 2019-02-10 MED ORDER — RENA-VITE PO TABS: 1.0000 | ORAL_TABLET | Freq: Every day | ORAL | 0 refills | Status: DC

## 2019-02-10 MED ORDER — MIDODRINE HCL 5 MG PO TABS
10.0000 mg | ORAL_TABLET | Freq: Three times a day (TID) | ORAL | Status: DC
  Administered 2019-02-10 – 2019-02-16 (×16): 10 mg via ORAL
  Filled 2019-02-10 (×14): qty 2

## 2019-02-10 MED ORDER — SEVELAMER CARBONATE 2.4 G PO PACK
2.4000 g | PACK | Freq: Three times a day (TID) | ORAL | Status: DC
  Administered 2019-02-10 – 2019-02-17 (×17): 2.4 g via ORAL
  Filled 2019-02-10 (×20): qty 1

## 2019-02-10 MED ORDER — POLYETHYLENE GLYCOL 3350 17 G PO PACK
17.0000 g | PACK | Freq: Every day | ORAL | Status: DC
  Administered 2019-02-11 – 2019-03-05 (×21): 17 g via ORAL
  Filled 2019-02-10 (×22): qty 1

## 2019-02-10 MED ORDER — NEPRO/CARBSTEADY PO LIQD: 237.0000 mL | Freq: Three times a day (TID) | ORAL | 0 refills | Status: DC

## 2019-02-10 MED ORDER — RENA-VITE PO TABS
1.0000 | ORAL_TABLET | Freq: Every day | ORAL | Status: DC
  Administered 2019-02-10 – 2019-03-04 (×22): 1 via ORAL
  Filled 2019-02-10 (×22): qty 1

## 2019-02-10 MED ORDER — NYSTATIN 100000 UNIT/GM EX POWD: Freq: Three times a day (TID) | CUTANEOUS | 0 refills | Status: DC

## 2019-02-10 MED ORDER — SORBITOL 70 % SOLN
30.0000 mL | Freq: Every day | Status: DC | PRN
  Administered 2019-02-24: 30 mL via ORAL
  Filled 2019-02-10: qty 30

## 2019-02-10 MED ORDER — DARBEPOETIN ALFA 200 MCG/0.4ML IJ SOSY: 200.0000 ug | PREFILLED_SYRINGE | INTRAMUSCULAR | Status: AC

## 2019-02-10 MED ORDER — NYSTATIN 100000 UNIT/GM EX POWD
Freq: Three times a day (TID) | CUTANEOUS | Status: DC
  Administered 2019-02-10 – 2019-03-05 (×60): via TOPICAL
  Filled 2019-02-10: qty 15

## 2019-02-10 MED ORDER — HYDROCODONE-ACETAMINOPHEN 5-325 MG PO TABS
1.0000 | ORAL_TABLET | ORAL | Status: DC | PRN
  Administered 2019-02-18 – 2019-03-04 (×2): 1 via ORAL
  Filled 2019-02-10: qty 2
  Filled 2019-02-10 (×2): qty 1
  Filled 2019-02-10: qty 2

## 2019-02-10 MED ORDER — CHLORHEXIDINE GLUCONATE CLOTH 2 % EX PADS
6.0000 | MEDICATED_PAD | Freq: Every day | CUTANEOUS | Status: DC
  Administered 2019-02-11 – 2019-02-12 (×2): 6 via TOPICAL

## 2019-02-10 MED ORDER — APIXABAN 5 MG PO TABS
5.0000 mg | ORAL_TABLET | Freq: Two times a day (BID) | ORAL | Status: DC
  Administered 2019-02-12 – 2019-03-05 (×43): 5 mg via ORAL
  Filled 2019-02-10 (×43): qty 1

## 2019-02-10 MED ORDER — SEVELAMER CARBONATE 2.4 G PO PACK: 2.4000 g | PACK | Freq: Three times a day (TID) | ORAL | Status: DC

## 2019-02-10 MED ORDER — NEPRO/CARBSTEADY PO LIQD
237.0000 mL | Freq: Three times a day (TID) | ORAL | Status: DC
  Administered 2019-02-11 – 2019-03-05 (×54): 237 mL via ORAL
  Filled 2019-02-10 (×10): qty 237

## 2019-02-10 MED ORDER — DARBEPOETIN ALFA 200 MCG/0.4ML IJ SOSY
200.0000 ug | PREFILLED_SYRINGE | INTRAMUSCULAR | Status: DC
  Filled 2019-02-10: qty 0.4

## 2019-02-10 MED ORDER — SALINE SPRAY 0.65 % NA SOLN
1.0000 | NASAL | Status: DC | PRN
  Administered 2019-02-28 – 2019-03-02 (×3): 1 via NASAL
  Filled 2019-02-10 (×2): qty 44

## 2019-02-10 MED ORDER — POLYETHYLENE GLYCOL 3350 17 G PO PACK: 17.0000 g | PACK | Freq: Every day | ORAL | 0 refills | Status: DC

## 2019-02-10 MED ORDER — APIXABAN 5 MG PO TABS
10.0000 mg | ORAL_TABLET | Freq: Two times a day (BID) | ORAL | Status: AC
  Administered 2019-02-10 – 2019-02-11 (×3): 10 mg via ORAL
  Filled 2019-02-10 (×3): qty 2

## 2019-02-10 MED ORDER — SENNOSIDES-DOCUSATE SODIUM 8.6-50 MG PO TABS
1.0000 | ORAL_TABLET | Freq: Every day | ORAL | Status: DC
  Administered 2019-02-10 – 2019-03-04 (×21): 1 via ORAL
  Filled 2019-02-10 (×22): qty 1

## 2019-02-10 MED ORDER — PANTOPRAZOLE SODIUM 40 MG PO TBEC: 40.0000 mg | DELAYED_RELEASE_TABLET | Freq: Every day | ORAL | Status: DC

## 2019-02-10 NOTE — Progress Notes (Signed)
Physical Therapy Treatment Patient Details Name: Scott Mcdowell MRN: [id number] DOB: 06-May-1941 Today's Date: 02/10/2019    History of Present Illness 78 yo male admitted with ARF, scrotal cellulitis, Pna. Pt extubated 01/22/19.  Hx of CVA with R hemiparesis    PT Comments    Pt with mild progression requiring less assist for bed mobility to come to sitting. Min to mod A of 2 to stand. Progress with OOB mobility, side stepping along side of bed with assist for RLE. Fatigued quickly and returned to bed. Pt plans to d/c to CIR today, agree with this plan.    Follow Up Recommendations  CIR     Equipment Recommendations  None recommended by PT    Recommendations for Other Services Rehab consult;OT consult     Precautions / Restrictions Precautions Precautions: Fall Precaution Comments: R hemiparesis, h/o 1 fall in past 1 year Restrictions Weight Bearing Restrictions: No    Mobility  Bed Mobility Overal bed mobility: Needs Assistance Bed Mobility: Supine to Sit Rolling: Mod assist   Supine to sit: Mod assist Sit to supine: Mod assist   General bed mobility comments: mod A for trunk and LEs  Transfers Overall transfer level: Needs assistance Equipment used: Rolling walker (2 wheeled)   Sit to Stand: Max assist         General transfer comment: pt performed sit <> stand from elevated bed with max A with LT UE pulling up on RW.  once standing pt able to stand with min A 2 x 3 minutes for performing cleaning and hygiene  Ambulation/Gait                 Stairs             Wheelchair Mobility    Modified Rankin (Stroke Patients Only)       Balance Overall balance assessment: Needs assistance Sitting-balance support: No upper extremity supported;Feet supported Sitting balance-Leahy Scale: Fair     Standing balance support: Bilateral upper extremity supported Standing balance-Leahy Scale: Poor                               Cognition Arousal/Alertness: Awake/alert Behavior During Therapy: WFL for tasks assessed/performed Overall Cognitive Status: Within Functional Limits for tasks assessed                                        Exercises      General Comments        Pertinent Vitals/Pain      Home Living                      Prior Function            PT Goals (current goals can now be found in the care plan section) Acute Rehab PT Goals PT Goal Formulation: With patient Time For Goal Achievement: 02/17/19 Potential to Achieve Goals: Good Progress towards PT goals: Progressing toward goals    Frequency    Min 3X/week      PT Plan Current plan remains appropriate    Co-evaluation PT/OT/SLP Co-Evaluation/Treatment: Yes Reason for Co-Treatment: Complexity of the patient's impairments (multi-system involvement) PT goals addressed during session: Strengthening/ROM;Proper use of DME;Balance;Mobility/safety with mobility        AM-PAC PT "6 Clicks" Mobility   Outcome Measure  Help  needed turning from your back to your side while in a flat bed without using bedrails?: A Little Help needed moving from lying on your back to sitting on the side of a flat bed without using bedrails?: A Little Help needed moving to and from a bed to a chair (including a wheelchair)?: A Lot Help needed standing up from a chair using your arms (e.g., wheelchair or bedside chair)?: A Lot Help needed to walk in hospital room?: A Lot Help needed climbing 3-5 steps with a railing? : Total 6 Click Score: 13    End of Session Equipment Utilized During Treatment: Gait belt Activity Tolerance: Patient tolerated treatment well Patient left: with call bell/phone within reach;in bed;with bed alarm set;with nursing/sitter in room Nurse Communication: Mobility status PT Visit Diagnosis: Muscle weakness (generalized) (M62.81);Difficulty in walking, not elsewhere classified (R26.2);Hemiplegia  and hemiparesis Hemiplegia - Right/Left: Right Hemiplegia - dominant/non-dominant: Dominant Hemiplegia - caused by: Cerebral infarction     Time: 1447-1520 PT Time Calculation (min) (ACUTE ONLY): 33 min  Charges:  $Therapeutic Activity: 8-22 mins                    Reinaldo Berber, PT, DPT Acute Rehabilitation Services Pager: [phone] Office: [phone]     Reinaldo Berber 02/10/2019, 3:25 PM

## 2019-02-10 NOTE — Progress Notes (Signed)
Charlett Blake, MD  Physician  Physical Medicine and Rehabilitation  Consult Note  Signed  Date of Service:  01/28/2019 2:01 PM      Related encounter: Admission (Discharged) from 01/14/2019 in [facility]      Signed      Expand All Collapse All    Show:Clear all [x] Manual[x] Template[] Copied  Added by: [x] Kirsteins, Luanna Salk, MD[x] Love, Ivan Anchors, PA-C  [] Hover for details      Physical Medicine and Rehabilitation Consult   Reason for Consult: Debility.  Referring Physician: Dr. Lonny Prude   HPI: Scott Mcdowell is a 78 y.o. male with history of HTN, CVA with residual R-HP, morbid obesity who was admitted from [facility] 01/15/19 with reports of SOB X few weeks, moderate right pleural effusion with anasarc and ascites, scrotal edema with cellulitis, acute renal failure with positive troponins. He was started on broad spectrum antibiotics for CAP and underwent thoracocentesis of 600 cc of transudative pleural effusion on 5/30.  fluid.   Nephrology consulted AKI with volume overload and he was started on IV diuresis with poor response.  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  He was found to have right brachial DVT and started on IV heparin.  He developed significant hypotension with ABLA as well as maaron stools and required multiple units PRBC,  intubation as well as pressors.  NGT showed coffee ground emesis and he underwent EGD revealing blood in gastric body and duodenum with active bleeding and adherent clot in distal gastric body which was treated with hemostatic clip and epinephrine injections for hemostasis.  He tolerated extubation without difficulty and CRRT initiated. Case discussed with Dr. Earlie Server and and he underwent bone biopsy 6/9 by radiology--pathology pending.   He was cleared to start  anticoagulation 6/10 as H/H stable. He has been weaned off pressors and to be transitioned to HD today due to lack of renal recovery and ongoing issues with overload.  Therapy ongoing and CIR recommended due to functional deficits. .   The patient states that he had a good recovery from his stroke and that he was independent without device and able to do all his dressing and bathing.   Review of Systems  Constitutional: Negative for chills and fever.  HENT: Negative for hearing loss and tinnitus.   Eyes: Negative for blurred vision and double vision.  Respiratory: Negative for cough and shortness of breath.   Cardiovascular: Negative for chest pain and palpitations.  Gastrointestinal: Negative for abdominal pain, heartburn and nausea.  Musculoskeletal: Negative for myalgias.  Skin: Negative for rash.  Neurological: Positive for focal weakness. Negative for dizziness and headaches.        Past Medical History:  Diagnosis Date  . Stroke [facility])    w right sided weakness         Past Surgical History:  Procedure Laterality Date  . ESOPHAGOGASTRODUODENOSCOPY N/A 01/21/2019   Procedure: ESOPHAGOGASTRODUODENOSCOPY (EGD);  Surgeon: Yetta Flock, MD;  Location: [facility];  Service: Gastroenterology;  Laterality: N/A;  at bedside. PCCM intubating patient and he will be ready for EGD by 10: 30-10:40am  . HEMOSTASIS CLIP PLACEMENT  01/21/2019   Procedure: HEMOSTASIS CLIP PLACEMENT;  Surgeon: Yetta Flock, MD;  Location: [facility] ENDOSCOPY;  Service: Gastroenterology;;  . HEMOSTASIS CONTROL  01/21/2019   Procedure: HEMOSTASIS CONTROL;  Surgeon: Yetta Flock, MD;  Location: [facility]  ENDOSCOPY;  Service: Gastroenterology;;  [place]a Rm Probe  . IR FLUORO GUIDE CV LINE RIGHT  01/20/2019  . IR US GUIDE VASC ACCESS RIGHT  01/20/2019  . SCLEROTHERAPY  01/21/2019   Procedure: SCLEROTHERAPY;  Surgeon: Yetta Flock, MD;  Location: [facility];  Service: Gastroenterology;;          Family History  Problem Relation Age of Onset  . Cancer Mother   . Parkinson's disease Father     Social History:  Married. 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  He reports that he has quit smoking 2004. His smoking use included cigarettes. He has never used smokeless tobacco. He reports current alcohol use--one beer every other day.  No history on file for drug.    Allergies: No Known Allergies          Medications Prior to Admission  Medication Sig Dispense Refill  . aspirin 81 MG chewable tablet Chew 81 mg by mouth daily.    Marland Kitchen atorvastatin (LIPITOR) 10 MG tablet Take 10 mg by mouth daily.    . ramipril (ALTACE) 2.5 MG capsule Take 2.5 mg by mouth daily.      Home: Home Living Family/patient expects to be discharged to:: Unsure Living Arrangements: Spouse/significant other Available Help at Discharge: Family Type of Home: House Home Access: Stairs to enter [employer] of Steps: 3 (uses door/frame) to hold on to Entrance Stairs-Rails: None Home Layout: Two level, Able to live on main level with bedroom/bathroom Bathroom Toilet: Standard Home Equipment: Walker - 2 wheels, Cane - single point  Functional History: Prior Function Level of Independence: Independent with assistive device(s) Comments: uses RW vs cane PRN. "On a good day" pt can ambulate short distances unassisted.   Was able to write and use R hand functionally Functional Status:  Mobility: Bed Mobility Overal bed mobility: Needs Assistance Bed Mobility: Supine to Sit Rolling: Mod assist Supine to sit: Mod assist, +2 for physical assistance Sit to supine: Mod assist, +2 for physical assistance General bed mobility comments: assist for legs and to guide trunk Transfers Overall transfer level: Needs assistance Equipment used: 2 person hand held assist Transfers: Sit to/from Stand, Stand Pivot Transfers Sit to Stand: Mod assist, +2 physical assistance Stand pivot  transfers: Mod assist, +2 physical assistance(SPT/squat pivot to L side) General transfer comment: assist to stand and stabilize. Pt unable to pivot to R side Ambulation/Gait Ambulation/Gait assistance: Min assist, +2 safety/equipment Gait Distance (Feet): 5 Feet Assistive device: Rolling walker (2 wheeled) Gait Pattern/deviations: Step-to pattern General Gait Details: side steps at edge of bed, then pivotal steps to recliner with RW, no buckling, distance limited by fatigue Gait velocity: decr    ADL: ADL Overall ADL's : Needs assistance/impaired Eating/Feeding: Set up, Sitting Grooming: Minimal assistance, Bed level, Moderate assistance(lotion ) Upper Body Bathing: Moderate assistance Lower Body Bathing: Total assistance, +2 for physical assistance, Sit to/from stand(pt 10%) Upper Body Dressing : Moderate assistance, Maximal assistance Lower Body Dressing: Total assistance, +2 for physical assistance, Sit to/from stand Toilet Transfer: Moderate assistance, +2 for physical assistance, Stand-pivot, Squat-pivot(chair to bed, to L side) General ADL Comments: able to hold lotion with RUE as gross assist  Cognition: Cognition Overall Cognitive Status: Within Functional Limits for tasks assessed Orientation Level: Oriented X4 Cognition Arousal/Alertness: Awake/alert Behavior During Therapy: WFL for tasks assessed/performed Overall Cognitive Status: Within Functional Limits for tasks assessed  Blood pressure (!) 87/54, pulse 90, temperature 98.5 F (36.9 C), temperature source Oral, resp. rate 18, height 6\' 2"  (1.88  m), weight 109.2 kg, SpO2 97 %. Physical Exam  Nursing note and vitals reviewed. Constitutional: He is oriented to person, place, and time. He appears well-developed and well-nourished. No distress.  Cardiovascular: Normal rate, regular rhythm and normal heart sounds.  No murmur heard. Respiratory: Effort normal and breath sounds normal. No respiratory distress.  GI:  Soft. Bowel sounds are normal. He exhibits no distension. There is no abdominal tenderness.  Musculoskeletal:        General: Edema present.  Neurological: He is alert and oriented to person, place, and time.  Speech clear. Able to follow basic commands without difficulty. Right hemiparesis. Right inattention?  Skin: He is not diaphoretic.  Ecchymosis left forearm greater than right forearm There is edema in the right upper extremity greater than right lower extremity as well as mild pretibial edema left lower extremity  Psychiatric: He has a normal mood and affect.  Motor: 3/5 in the right deltoid bicep tricep finger flexors and extensors 4+ in the left deltoid bicep tricep grip finger flexors and extensors 3+ in the right hip flexor knee extensor ankle dorsiflexor 4+ in the left hip flexor knee extensor ankle dorsiflexor Sensation is intact light touch bilateral upper and lower extremities. Speech without dysarthria or aphasia.  Lab Results Last 24 Hours       Results for orders placed or performed during the hospital encounter of 01/14/19 (from the past 24 hour(s))  Basic metabolic panel     Status: Abnormal   Collection Time: 01/28/19  8:30 AM  Result Value Ref Range   Sodium 135 135 - 145 mmol/L   Potassium 4.6 3.5 - 5.1 mmol/L   Chloride 101 98 - 111 mmol/L   CO2 24 22 - 32 mmol/L   Glucose, Bld 126 (H) 70 - 99 mg/dL   BUN 56 (H) 8 - 23 mg/dL   Creatinine, Ser 4.12 (H) 0.61 - 1.24 mg/dL   Calcium 9.6 8.9 - 10.3 mg/dL   GFR calc non Af Amer 13 (L) >60 mL/min   GFR calc Af Amer 15 (L) >60 mL/min   Anion gap 10 5 - 15     Imaging Results (Last 48 hours)  No results found.     Assessment/Plan: Diagnosis: Debility due to acute on chronic renal failure 1. Does the need for close, 24 hr/day medical supervision in concert with the patient's rehab needs make it unreasonable for this patient to be served in a less intensive setting? Yes 2. 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 Due to bladder management, bowel management, safety, skin/wound care, disease management, medication administration, pain management and patient education, does the patient require 24 hr/day rehab nursing? Yes 4. Does the patient require coordinated care of a physician, rehab nurse, PT (1-2 hrs/day, 5 days/week) and OT (1-2 hrs/day, 5 days/week) to address physical and functional deficits in the context of the above medical diagnosis(es)? Yes Addressing deficits in the following areas: balance, endurance, locomotion, strength, transferring, bowel/bladder control, bathing, dressing, feeding, grooming, toileting and psychosocial support 5. Can the patient actively participate in an intensive therapy program of at least 3 hrs of therapy per day at least 5 days per week? Yes 6. The potential for patient to make measurable gains while on inpatient rehab is good 7. Anticipated functional outcomes upon discharge from inpatient rehab are supervision  with PT, supervision with OT, n/a with SLP. 8. Estimated  rehab length of stay to reach the above functional goals is: 14 to 17 days 9. Anticipated D/C setting: Home 10. Anticipated post D/C treatments: [place] therapy 11. Overall Rehab/Functional Prognosis: good  RECOMMENDATIONS: This patient's condition is appropriate for continued rehabilitative care in the following setting: CIR Patient has agreed to participate in recommended program. Yes Note that insurance prior authorization may be required for reimbursement for recommended care.  Comment: We will need to know oncology plan once pathology results are available  "I have personally performed a face to face diagnostic evaluation of this patient.  Additionally, I have reviewed and concur with the physician assistant's documentation above."  Charlett Blake M.D. [facility] FAAPM&R (Sports Med, Neuromuscular Med) Diplomate [place] Board of [place], PA-C 01/28/2019        Revision History                     Routing History

## 2019-02-10 NOTE — Progress Notes (Signed)
[place] for apixaban Indication: DVT  No Known Allergies  Patient Measurements: Height: 6\' 2"  (188 cm) Weight: 237 lb 7 oz (107.7 kg) IBW/kg (Calculated) : 82.2 Heparin Dosing Weight: 104 kg  Vital Signs: Temp: 98.6 F (37 C) (06/24 0513) Temp Source: Oral (06/24 0513) BP: 116/47 (06/24 0513) Pulse Rate: 86 (06/24 0513)  Labs: Recent Labs    02/08/19 0547 02/09/19 0601 02/10/19 0542  HGB  --  7.6* 7.7*  HCT  --  23.3* 24.2*  PLT  --  PLATELET CLUMPS NOTED ON SMEAR, UNABLE TO ESTIMATE 185  CREATININE 5.84* 6.82* 4.28*    Estimated Creatinine Clearance: 18 mL/min (A) (by C-G formula based on SCr of 4.28 mg/dL (H)).  Assessment: 78 yo male with RUE DVT . Pharmacy consulted 02/05/19 to transition from heparin to apixaban.  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  Known upper GIB - recent EGD 01/21/19. Hematology recommended Eliquis.  CM is pursuing insurance approval for CIR if able vs SNF pending management of kidney failure  Goal of Therapy:  Monitor platelets by anticoagulation protocol: Yes   Plan:  Apixaban now at 5 mg po BID.  Monitor for s/sx of bleeding  Britni Driscoll A. Levada Dy, PharmD, [place] Please utilize Amion for appropriate phone number to reach the unit pharmacist ([place])

## 2019-02-10 NOTE — Progress Notes (Signed)
[facility]    NEPHROLOGY PROGRESS NOTE  SUBJECTIVE:  He was moved to inpatient rehab.  Last HD on 6/23 with 3 kg UF.      Review of systems:  Denies shortness of breath or chest pain  Denies nausea or vomiting    OBJECTIVE:  Vitals:   02/10/19 1640  BP: (!) 100/52  Pulse: 85  Resp: 18  Temp: 98 F (36.7 C)  SpO2: 98%   No intake or output data in the 24 hours ending 02/10/19 1855    General:  AAOx3 NAD HEENT: MMM [place] AT anicteric sclera CV:  RRR on exam  Lungs:  Clear anteriorly unlabored  Abd:  abd SNT/ND with normal BS GU:  Bladder non-palpable Extremities: 2-3+ bilateral lower extremity edema Access:  RIJ tunneled catheter; LUE AVF with bruit and thrill    MEDICATIONS:  . apixaban  10 mg Oral BID   Followed by  . [START ON 02/12/2019] apixaban  5 mg Oral BID  . [START ON 02/16/2019] darbepoetin (ARANESP) injection - DIALYSIS  200 mcg Intravenous Q Tue-HD  . feeding supplement (NEPRO CARB STEADY)  237 mL Oral TID BM  . midodrine  10 mg Oral TID WC  . multivitamin  1 tablet Oral QHS  . nystatin   Topical TID  . [START ON 02/11/2019] pantoprazole  40 mg Oral Daily  . [START ON 02/11/2019] polyethylene glycol  17 g Oral Daily  . senna-docusate  1 tablet Oral QHS  . sevelamer carbonate  2.4 g Oral TID WC       LABS:   CBC Latest Ref Rng & Units 02/10/2019 02/09/2019 02/06/2019  WBC 4.0 - 10.5 K/uL 6.6 7.7 8.1  Hemoglobin 13.0 - 17.0 g/dL 7.7(L) 7.6(L) 7.2(L)  Hematocrit 39.0 - 52.0 % 24.2(L) 23.3(L) 23.2(L)  Platelets 150 - 400 K/uL 185 PLATELET CLUMPS NOTED ON SMEAR, UNABLE TO ESTIMATE 169    CMP Latest Ref Rng & Units 02/10/2019 02/09/2019 02/08/2019  Glucose 70 - 99 mg/dL 109(H) 100(H) 104(H)  BUN 8 - 23 mg/dL 40(H) 77(H) 59(H)  Creatinine 0.61 - 1.24 mg/dL 4.28(H) 6.82(H) 5.84(H)  Sodium 135 - 145 mmol/L 136 136 135  Potassium 3.5 - 5.1 mmol/L 4.3 5.2(H) 4.8  Chloride 98 - 111 mmol/L 97(L) 100 99  CO2 22 - 32 mmol/L 26 23 24   Calcium 8.9 -  10.3 mg/dL 9.1 8.9 8.9  Total Protein 6.5 - 8.1 g/dL - 5.6(L) -  Total Bilirubin 0.3 - 1.2 mg/dL - 0.9 -  Alkaline Phos 38 - 126 U/L - 66 -  AST 15 - 41 U/L - 30 -  ALT 0 - 44 U/L - 17 -    Lab Results  Component Value Date   PTH 40 02/02/2019   PTH Comment 02/02/2019   CALCIUM 9.1 02/10/2019   PHOS 3.4 02/10/2019       Component Value Date/Time   COLORURINE YELLOW 01/17/2019 1029   APPEARANCEUR HAZY (A) 01/17/2019 1029   LABSPEC 1.012 01/17/2019 1029   PHURINE 5.0 01/17/2019 1029   GLUCOSEU NEGATIVE 01/17/2019 1029   [place] 01/17/2019 1029   [place] 01/17/2019 1029   KETONESUR 5 (A) 01/17/2019 1029   PROTEINUR NEGATIVE 01/17/2019 1029   NITRITE NEGATIVE 01/17/2019 1029   LEUKOCYTESUR NEGATIVE 01/17/2019 1029      Component Value Date/Time   PHART 7.414 01/22/2019 0839   PCO2ART 36.8 01/22/2019 0839   PO2ART 104 01/22/2019 0839   HCO3 23.1 01/22/2019 0839   ACIDBASEDEF  0.8 01/22/2019 0839   O2SAT 97.8 01/22/2019 0839       Component Value Date/Time   IRON 41 (L) 01/30/2019 0308   TIBC 305 01/30/2019 0308   FERRITIN 724 (H) 01/30/2019 0308   IRONPCTSAT 13 (L) 01/30/2019 0308       ASSESSMENT/PLAN:    78 year old male patient who presented with pneumonia complicated by acute kidney injury requiring renal replacement therapy.  He is dialyzing via a tunneled right IJ catheter.  He had subsequent respiratory failure requiring intubation, and acute blood loss anemia from upper GI bleeding.  He also had a right upper extremity DVT requiring anticoagulation.  1.  Acute kidney injury which was felt to have progressed to ESRD.  Appears CRRT started 6/3 and iHD started 6/11.   - HD today per TTS schedule  - He is dialyzing via a right IJ tunnel catheter.  He has a left brachiocephalic AV fistula in place.   - Given advanced underlying renal failure, he was felt to have progressed to ESRD. He has a outpatient spot for TTS at [facility] at 12:15  - he has  received midodrine to support BP. Currently ordered at 10 mg TID    2.  Chronic kidney disease stage IV.  His serum creatinine was 2.4 on admission with concern for progression as above.   3.  Pneumonia.  Resolved.  4.  Plasma cell myeloma.  Per oncology.  5.  Right upper extremity DVT.  On eliquis per primary team   6. Anemia 2/2 CKD  - no acute indication for PRBC's.  On aranesp   7. He has had episodic SVT for which primary team initiated amio

## 2019-02-10 NOTE — Progress Notes (Signed)
Scott Arn, MD  Physician  Physical Medicine and Rehabilitation  PMR Pre-admission  Addendum  Date of Service:  02/04/2019 5:11 PM      Related encounter: Admission (Discharged) from 01/14/2019 in [facility] all [x]Manual[x]Template[x]Copied  Added by: [x], Vertis Kelch, RN[x]Patel, Domenick Bookbinder, MD  []Hover for details PMR Admission Coordinator Pre-Admission Assessment  Patient: Scott Mcdowell is an 78 y.o., male MRN: [id number] DOB: [birth year] Height: 6' 2" (188 cm) Weight: 107.7 kg                                                                                                                                                  Insurance Information HMO: yes    PPO:      PCP:      IPA:      80/20:      OTHER: medicare advantage PRIMARY: UHC medicare      Policy#: [id number]      Subscriber: pt CM Name: Lenna Sciara      Phone#: [phone] ext [phone]     Fax#: [phone] Pre-Cert#: [id number] approved for 7 days f/u with [facility] phone [phone] ext [phone]      Employer: retired Benefits:  Phone #: [phone]     Name: 6/22 Eff. Date: 08/19/2018     Deduct: none      Out of Pocket Max: $3600      Life Max: none CIR: $295 co pay per day days 1 until 5      SNF: no co pay days 1 until 20; $160 co pay per day days 21 until 43; no co pay days 44 until 100 Outpatient: $30 co pay per visit      Co-Pay: visits per medical neccesity Home Health: 100%      Co-Pay: visits per medical neccesity DME: 80%     Co-Pay: 20% Providers: in network   SECONDARY: none     Medicaid Application Date:       Case Manager:  Disability Application Date:       Case Worker:   The Data Collection Information Summary for patients in Inpatient Rehabilitation Facilities with attached Privacy Act [place] Records was provided and verbally reviewed with: Patient  Emergency Contact Information         Contact Information    Name  Relation Home Work [address]   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 Presented to [facility] ED 01/15/2019 with reports of shortness of breath times the past few weeks, scrotal edema.  Sodium 137, potassium 5.6, BUN 80, creatinine 2.5. Troponin 0.07. COVID negative. CT chest abdomen pelvis showed moderate right pleural effusion associated with atelectasis versus consolidation. Mild left basilar scarring. 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 The right common iliac measuring 2.3 cm in caliber left common iliac measuring 2.3. Testicular ultrasound no evidence of testicular mass. Patient was transferred to [facility] for further evaluation. Patient underwent right thoracentesis 01/16/2019 yielding 600 mL for pleural effusion. He did require intubation through 01/22/2019. Maintainedon broad-spectrum antibiotics for suspected pneumonia. Hospital course complicated by [facility] follow-up per renal services circulatory shock requiring vasopressors. On 01/19/2019 diagnosis of right brachial vein DVT started on heparin. Developed GI bleed underwent EGD 01/21/2019 adherent clot friable mucosa distal gastric body area was clipped per Dr. Havery Moros. His anticoagulation was initially discontinued due to high risk of bleeding. To date patient received 6 units of packed red blood cells, 1 unit of platelets and 3 units of fresh frozen plasma. Hemoglobin stabilized patient was transitioned to Eliquis for DVT and latest hemoglobin 7.2. Patient was found to have a lytic rib lesion concerning for possible malignancy.  A multiple myeloma panel and bone marrow biopsy were performed with follow-up per oncology services Dr. Marin Olp. 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 Plan would be follow-up outpatient with oncology services. Patient's creatinine 6.50-4.61 hemodialysis has been initiated plan long-term hemodialysis Tuesday Thursday Saturday schedule. Patient with right IJ tunneled catheter and left brachiocephalic AV fistula has been placed.Patient with episodic SVT 02/08/2019 initially on metoprolol discontinued started on amiodarone loaded 200 mg twice daily changed to 100 mg twice daily 02/10/2019.   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  Prior Rehab/Hospitalizations:  Has the patient had prior rehab or hospitalizations prior to admission? Yes  Has the patient had major surgery during 100 days prior to admission? Yes  Current Medications   Current Facility-Administered Medications:    0.9 %  sodium chloride infusion, , Intravenous, PRN, Dagoberto Ligas, PA-C, Stopped at 02/03/19 1409   [COMPLETED] apixaban (ELIQUIS) tablet 10 mg, 10 mg, Oral, BID, 10 mg at 02/09/19 2248 **FOLLOWED BY** apixaban (ELIQUIS) tablet 5 mg, 5 mg, Oral, BID, Samtani, Jai-Gurmukh, MD, 5 mg at 02/10/19 0802   chlorhexidine gluconate (MEDLINE KIT) (PERIDEX) 0.12 % solution 15 mL, 15 mL, Mouth Rinse, BID, Eveland, Matthew, PA-C, 15 mL at 02/10/19 0800   Chlorhexidine Gluconate Cloth 2 % PADS 6 each, 6 each, Topical, Q0600, Dagoberto Ligas, PA-C, 6 each at 02/10/19 0542   Darbepoetin Alfa (ARANESP) injection  200 mcg, 200 mcg, Intravenous, Q Tue-HD, Eveland, Matthew, PA-C, 200 mcg at 02/09/19 1427   feeding supplement (NEPRO CARB STEADY) liquid 237 mL, 237 mL, Oral, TID BM, Mariel Aloe, MD, 237 mL at 02/10/19 0932   HYDROcodone-acetaminophen (NORCO/VICODIN) 5-325 MG per tablet 1-2 tablet, 1-2 tablet, Oral, Q4H PRN, Dagoberto Ligas, PA-C, 1 tablet at 02/05/19 2145   MEDLINE mouth rinse, 15 mL, Mouth Rinse, q12n4p, Eveland, Matthew, PA-C, 15 mL at 02/10/19 1201   menthol-cetylpyridinium (CEPACOL)  lozenge 3 mg, 1 lozenge, Oral, PRN, Dagoberto Ligas, PA-C, 3 mg at 02/04/19 1747   midodrine (PROAMATINE) tablet 10 mg, 10 mg, Oral, TID WC, Dagoberto Ligas, PA-C, 10 mg at 02/10/19 1202   multivitamin (RENA-VIT) tablet 1 tablet, 1 tablet, Oral, QHS, Dagoberto Ligas, PA-C, 1 tablet at 02/09/19 2248   nystatin (MYCOSTATIN/NYSTOP) topical powder, , Topical, TID, Eveland, Matthew, PA-C   pantoprazole (PROTONIX) EC tablet 40 mg, 40 mg, Oral, Daily, Mariel Aloe, MD, 40 mg at 02/10/19 0800   polyethylene glycol (MIRALAX / GLYCOLAX) packet 17 g, 17 g, Oral, Daily, Dagoberto Ligas, PA-C, 17 g at 02/10/19 0932   senna-docusate (Senokot-S) tablet 1 tablet, 1 tablet, Oral, QHS, Samtani, Jai-Gurmukh, MD, 1 tablet at 02/09/19 2248   sevelamer carbonate (RENVELA) powder PACK 2.4 g, 2.4 g, Oral, TID WC, Eveland, Matthew, PA-C, 2.4 g at 02/10/19 1201   sodium chloride (OCEAN) 0.65 % nasal spray 1 spray, 1 spray, Each Nare, PRN, Dagoberto Ligas, PA-C, 1 spray at 01/20/19 0957   sodium chloride flush (NS) 0.9 % injection 10-40 mL, 10-40 mL, Intracatheter, PRN, Dagoberto Ligas, PA-C, 30 mL at 01/30/19 2311  Patients Current Diet:     Diet Order                  Diet renal with fluid restriction Fluid restriction: 1200 mL Fluid; Room service appropriate? Yes; Fluid consistency: Thin  Diet effective now               Precautions / Restrictions Precautions Precautions: Fall Precaution  Comments: R hemiparesis, h/o 1 fall in past 1 year Restrictions Weight Bearing Restrictions: No   Has the patient had 2 or more falls or a fall with injury in the past year?No  Prior Activity Level Limited Community (1-2x/wk): Mod I with RW  Prior Functional Level Prior Function Level of Independence: Independent with assistive device(s) Comments: uses RW vs cane PRN. "On a good day" pt can ambulate short distances unassisted.   Was able to write and use R hand functionally  Self Care: Did the patient need help bathing, dressing, using the toilet or eating?  Independent  Indoor Mobility: Did the patient need assistance with walking from room to room (with or without device)? Independent  Stairs: Did the patient need assistance with internal or external stairs (with or without device)? Needed some help  Functional Cognition: Did the patient need help planning regular tasks such as shopping or remembering to take medications? Needed some help  Home Assistive Devices / [place] Devices/Equipment: Cane (specify quad or straight) Home Equipment: Walker - 2 wheels, Cane - single point  Prior Device Use: Indicate devices/aids used by the patient prior to current illness, exacerbation or injury? Walker and cane  Current Functional Level Cognition  Overall Cognitive Status: Within Functional Limits for tasks assessed Orientation Level: Oriented X4    Extremity Assessment (includes Sensation/Coordination)  Upper Extremity Assessment: RUE deficits/detail RUE Deficits / Details: edema noted in RUE; continued to educate pt on using LUE for PROM of RUE  LUE Deficits / Details: CVA 2004. decreased AROM in all planes of motion.  Noted significant edema. RN notified.  Positioned RUE on 2 pillows   Lower Extremity Assessment: Defer to PT evaluation RLE Deficits / Details: hip flex 2/5, knee ext 3-/5 RLE Sensation: decreased proprioception RLE Coordination:  decreased gross motor, decreased fine motor    ADLs  Overall ADL's : Needs assistance/impaired Eating/Feeding: Set up, Sitting Eating/Feeding Details (indicate cue  type and reason): pt required assistance with removing lids Grooming: Set up, Sitting Upper Body Bathing: Minimal assistance, Sitting Upper Body Bathing Details (indicate cue type and reason): minA to wash LUE;limited functional use of RUE Lower Body Bathing: Total assistance, +2 for physical assistance, Sit to/from stand(pt 10%) Upper Body Dressing : Moderate assistance, Maximal assistance Lower Body Dressing: Total assistance, +2 for physical assistance, Sit to/from stand Toilet Transfer: Moderate assistance, +2 for physical assistance, Stand-pivot, Control and instrumentation engineer Details (indicate cue type and reason): pt decined ADL tasks this session due to fixation on waiting for an "important, private phone call" [place] and Hygiene: Maximal assistance, Moderate assistance Toileting - Clothing Manipulation Details (indicate cue type and reason): pt rolled R<>L for pericare Functional mobility during ADLs: Moderate assistance, +2 for physical assistance General ADL Comments: pt completed ADL while sitting in supported seat;use of maximove to get pt to chair to maximize pt's participation during session    Mobility  Overal bed mobility: Needs Assistance Bed Mobility: Supine to Sit Rolling: Mod assist Supine to sit: Mod assist Sit to supine: Mod assist General bed mobility comments: mod A for trunk and LEs    Transfers  Overall transfer level: Needs assistance Equipment used: Rolling walker (2 wheeled) Transfer via Lift Equipment: Maximove Transfers: Sit to/from Stand, Risk manager Sit to Stand: Max assist Stand pivot transfers: +2 physical assistance, Min assist General transfer comment: pt performed sit <> stand from elevated bed with max A with LT UE pulling up on RW.  once  standing pt able to stand with min A 2 x 3 minutes for performing cleaning and hygiene    Ambulation / Gait / Stairs / Wheelchair Mobility  Ambulation/Gait Ambulation/Gait assistance: Min assist, +2 safety/equipment Gait Distance (Feet): 5 Feet Assistive device: Rolling walker (2 wheeled) Gait Pattern/deviations: Step-to pattern General Gait Details: pt declining ambulation anxiously awaiting a phone call Gait velocity: decr    Posture / Balance Dynamic Sitting Balance Sitting balance - Comments: pt able to sit edge of bed and scoot edge of bed with min facilitaiton for hand placement and wt shifts Balance Overall balance assessment: Needs assistance Sitting-balance support: No upper extremity supported, Feet supported Sitting balance-Leahy Scale: Fair Sitting balance - Comments: pt able to sit edge of bed and scoot edge of bed with min facilitaiton for hand placement and wt shifts Standing balance support: Bilateral upper extremity supported Standing balance-Leahy Scale: Poor Standing balance comment: pt required modA+2 to maintain upright posture    Special needs/care consideration BiPAP/CPAP n/a CPM n/a Continuous Drip IV n/a Dialysis OP new hemodialysis this admission [facility] sat 12:15 pm Life Vest n/a Oxygen n/a Special Bed n/a Trach Size n/a Wound Vac n/a Skin Prevalon boots due to heel breakdown; ecchymosis to right arm and left leg; MASD groin and perineum, skin tear right buttocks; Stage 1 sacrum, left and right heel and stage 2 buttocks 0.5 x 0.5 x 0.25 cm Bowel mgmt: continent LBM 6/22 Bladder mgmt: oliguric Diabetic mgmt n/a Behavioral consideration  N/a Chemo/radiation  N/a    Previous Home Environment  Living Arrangements: Spouse/significant other  Lives With: Spouse Available Help at Discharge: Family, Available 24 hours/day Type of Home: House Home Layout: Two level, Able to live on main level with bedroom/bathroom Home Access: Stairs  to enter Entrance Stairs-Rails: None Entrance Stairs-Number of Steps: 3 (uses door/frame) to hold on to [employer]: Tub/shower unit, Architectural technologist: Standard Bathroom Accessibility: Yes How Accessible: Accessible via  walker Home Care Services: No  Discharge Living Setting Plans for Discharge Living Setting: Patient's home, Lives with (comment)(spouse) Type of Home at Discharge: House Discharge Home Layout: Two level, Able to live on main level with bedroom/bathroom Discharge Home Access: Stairs to enter Entrance Stairs-Rails: None(uses door frame to hold onto) Entrance Stairs-Number of Steps: 3 Discharge Bathroom Shower/Tub: Tub/shower unit, Curtain Discharge Bathroom Toilet: Standard Discharge Bathroom Accessibility: Yes How Accessible: Accessible via walker Does the patient have any problems obtaining your medications?: No  Social/Family/Support Systems Patient Roles: Spouse, Parent Contact Information: wife, [name] Anticipated Caregiver: wife and family Anticipated Caregiver's Contact Information: see above Caregiver Availability: 24/7 Discharge Plan Discussed with Primary Caregiver: Yes Is Caregiver In Agreement with Plan?: Yes Does Caregiver/Family have Issues with Lodging/Transportation while Pt is in Rehab?: No  Goals/Additional Needs Patient/Family Goal for Rehab: supervision with PT and OT Expected length of stay: ELOS 14 to 17 days Special Service Needs: Catholic Pt/Family Agrees to Admission and willing to participate: Yes Program Orientation Provided & Reviewed with Pt/Caregiver Including Roles  & Responsibilities: Yes  Decrease burden of Care through IP rehab admission: n/a  Possible need for SNF placement upon discharge: Possibly  Patient Condition: This patient's medical and functional status has changed since the consult dated: 01/28/2019 in which the Rehabilitation Physician determined and documented that the patient's condition is  appropriate for intensive rehabilitative care in an inpatient rehabilitation facility. See "History of Present Illness" (above) for medical update. Functional changes are: overall max assist. Patient's medical and functional status update has been discussed with the Rehabilitation physician and patient remains appropriate for inpatient rehabilitation. Will admit to inpatient rehab today.  Preadmission Screen Completed By: Danne Baxter RN MSN Cleatrice Burke, RN, 02/10/2019 1:34 PM ______________________________________________________________________   Discussed status with Dr. Posey Pronto on 02/10/2019 at 1334 and received approval for admission today.  Admission Coordinator:  Cleatrice Burke, time 5732 Date 02/10/2019     Revision History

## 2019-02-10 NOTE — Plan of Care (Signed)
  Problem: Health Behavior/Discharge Planning: Goal: Ability to manage health-related needs will improve Outcome: Adequate for Discharge   Problem: Skin Integrity: Goal: Risk for impaired skin integrity will decrease Outcome: Adequate for Discharge

## 2019-02-10 NOTE — IPOC Note (Signed)
Individualized overall Plan of Care [facility]) Patient Details Name: Scott Mcdowell MRN: [id number] DOB: 1941/02/22  Admitting Diagnosis: [place] [facility] Problems: Active Problems:   Debility   ESRD on dialysis [facility])   History of GI bleed     Functional Problem List: Nursing Bowel, Skin Integrity, Safety  PT Balance, Edema, Skin Integrity, Endurance, Motor  OT Balance, Edema, Endurance, Motor, Pain, Safety  SLP    TR         Basic ADL's: OT Eating, Grooming, Bathing, Dressing, Toileting     Advanced  ADL's: OT Simple Meal Preparation     Transfers: PT Bed Mobility, Bed to Chair, Car, Manufacturing systems engineer, Metallurgist: PT Ambulation, Emergency planning/management officer, Stairs     Additional Impairments: OT Fuctional Use of Upper Extremity  SLP        TR      Anticipated Outcomes Item Anticipated Outcome  Self Feeding Supervision/setup  Swallowing      Basic self-care  Min assist  Toileting  Min assist   Bathroom Transfers Min assist  Bowel/Bladder  To maintain continence of bowel and bladder.  Transfers  supervision basic, min car  Locomotion  superviison w/c x 150'; gait and steps TBD  Communication     Cognition     Pain  Pain level <3.  Safety/Judgment  Remain free from falls this admission.   Therapy Plan: PT Intensity: Minimum of 1-2 x/day ,45 to 90 minutes PT Frequency: 5 out of 7 days PT Duration Estimated Length of Stay: 18-21 OT Intensity: Minimum of 1-2 x/day, 45 to 90 minutes OT Frequency: 5 out of 7 days OT Duration/Estimated Length of Stay: 18-21 days      Team Interventions: Nursing Interventions Patient/Family Education, Bowel Management, Pain Management  PT interventions Ambulation/gait training, Discharge planning, DME/adaptive equipment instruction, Functional mobility training, Pain management, Psychosocial support, Splinting/orthotics, Therapeutic Activities, UE/LE Strength taining/ROM, Training and development officer,  [employer] reintegration, Technical sales engineer stimulation, Neuromuscular re-education, Patient/family education, IT trainer, Therapeutic Exercise, UE/LE Coordination activities, Wheelchair propulsion/positioning  OT Interventions Training and development officer, Academic librarian, Discharge planning, Disease mangement/prevention, Engineer, drilling, Functional mobility training, Neuromuscular re-education, Pain management, Patient/family education, Psychosocial support, Self Care/advanced ADL retraining, Skin care/wound managment, Therapeutic Activities, Therapeutic Exercise, UE/LE Strength taining/ROM, UE/LE Coordination activities  SLP Interventions    TR Interventions    SW/CM Interventions Discharge Planning, Psychosocial Support, Patient/Family Education   Barriers to Discharge MD  Medical stability, Wound care, Lack of/limited family support, Weight and Hemodialysis  Nursing      PT Inaccessible home environment pt has 2 STE no rail; has some type of bar on door frame  OT      SLP      SW       Team Discharge Planning: Destination: PT-Home ,OT- Home , SLP-  Projected Follow-up: PT-Home health PT, OT-  Home health OT, 24 hour supervision/assistance, SLP-  Projected Equipment Needs: PT-To be determined, Wheelchair (measurements), Wheelchair cushion (measurements), OT- 3 in 1 bedside comode, Tub/shower bench, SLP-  Equipment Details: PT-owns a s SPC and RW, OT-  Patient/family involved in discharge planning: PT- Patient,  OT-Patient, SLP-   MD ELOS: 18-23 days. 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  Presented to [facility] ED 01/15/2019 with shortness of breath and scrotal edema.  Labs were obtained.  Sodium 137, potassium 5.6, BUN 80, creatinine 2.5.  Troponin  0.07.  COVID negative.  CT chest abdomen pelvis showed  moderate right pleural effusion associated with atelectasis versus consolidation.  Mild left basilar scarring.  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  The right common iliac measuring 2.3 cm in caliber left common iliac measuring 2.3.  Testicular ultrasound no evidence of testicular mass.  Patient was transferred to [facility] for further evaluation.  He was noted to have a pleural effusion and had thoracentesis on 01/16/2019 with drainage of approximately 600 cc. He did require intubation through 01/22/2019.  Maintained on broad-spectrum antibiotics for suspected pneumonia.  Hospital course complicated by AKI with follow-up per renal services circulatory shock requiring vasopressors.  On 01/19/2019 he was found to have right brachial vein DVT and started on heparin.  He subsequently developed GI bleed and underwent EGD on 01/21/2019, which showed adherent clot friable mucosa distal gastric body area was clipped per Dr. Havery Moros.  His anticoagulation was initially discontinued due to high risk of bleeding.  To date patient received 6 units of packed red blood cells, 1 unit of platelets and 3 units of fresh frozen plasma.  Hemoglobin stabilized patient was transitioned to Eliquis for DVT and latest hemoglobin 7.6.  Patient was found to have a lytic rib lesion concerning for possible malignancy.  A multiple myeloma panel and bone marrow biopsy were performed with follow-up per oncology services Dr. Marin Olp.  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  Plan would be follow-up outpatient with oncology services.  Patient's creatinine 6.50-4.61 and hemodialysis has been initiated plan long-term hemodialysis Tuesday Thursday Saturday schedule.  Patient with right IJ tunneled catheter and left brachiocephalic AV fistula  has been placed.  Patient with episodic SVT 02/08/2019 initially on metoprolol discontinued started on amiodarone loaded 200 mg twice daily changed to 100 mg twice daily 02/10/2019.  Patient with reportedly multi-decubiti sacrum and heels. Prevalon boots in place and frequent turning to maintain skin integrity.  Patient with resulting functional deficits on chronic, endurance, self-care.  We will set goals for Supervision/Min A with PT/OT.  Due to the current state of emergency, patients may not be receiving their 3-hours of Medicare-mandated therapy.  See Team Conference Notes for weekly updates to the plan of care

## 2019-02-10 NOTE — H&P (Signed)
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  Per chart review and patient, patient lives with his spouse.  Independent with assistive device prior to admission.  Presented to [facility] ED 01/15/2019 with shortness of breath and scrotal edema.  Labs were obtained.  Sodium 137, potassium 5.6, BUN 80, creatinine 2.5.  Troponin  0.07.  COVID negative.  CT chest abdomen pelvis showed moderate right pleural effusion associated with atelectasis versus consolidation.  Mild left basilar scarring.  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  The right common iliac measuring 2.3 cm in caliber left common iliac measuring 2.3.  Testicular ultrasound no evidence of testicular mass.  Patient was transferred to [facility] for further evaluation.  He was noted to have a pleural effusion and had thoracentesis on 01/16/2019 with drainage of approximately 600 cc. He did require intubation through 01/22/2019.  Maintained on broad-spectrum antibiotics for suspected pneumonia.  Hospital course complicated by AKI with follow-up per renal services circulatory shock requiring vasopressors.  On 01/19/2019 he was found to have right brachial vein DVT and started on heparin.  He subsequently developed GI bleed and underwent EGD on 01/21/2019, which showed adherent clot friable mucosa distal gastric body area was clipped per Dr. Havery Moros.  His anticoagulation was initially discontinued due to high risk of bleeding.  To date patient received 6 units of packed red blood cells, 1 unit of platelets and 3 units of fresh frozen plasma.  Hemoglobin stabilized patient was transitioned to Eliquis for DVT and latest hemoglobin 7.6.  Patient was found to  have a lytic rib lesion concerning for possible malignancy.  A multiple myeloma panel and bone marrow biopsy were performed with follow-up per oncology services Dr. Marin Olp.  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  Plan would be follow-up outpatient with oncology services.  Patient's creatinine 6.50-4.61 and hemodialysis has been initiated plan long-term hemodialysis Tuesday Thursday Saturday schedule.  Patient with right IJ tunneled catheter and left brachiocephalic AV fistula has been placed.  Patient with episodic SVT 02/08/2019 initially on metoprolol discontinued started on amiodarone loaded 200 mg twice daily changed to 100 mg twice daily 02/10/2019.  Patient with reportedly multi-decubiti sacrum and heels. Prevalon boots in place and frequent turning to maintain skin integrity.  Therapy evaluations completed and patient was admitted for a comprehensive rehab program.  Please see preadmission assessment from earlier today as well.  Review of Systems  Constitutional: Positive for malaise/fatigue. Negative for chills and fever.  HENT: Negative for hearing loss.   Eyes: Negative for blurred vision and double vision.  Respiratory: Positive for shortness of breath.   Cardiovascular: Positive for leg swelling. Negative for chest pain and palpitations.  Gastrointestinal: Positive for constipation. Negative for heartburn, nausea and vomiting.  Genitourinary: Positive for urgency. Negative for dysuria, flank pain and hematuria.  Musculoskeletal: Positive for joint pain and myalgias.  Skin: Negative for rash.  Neurological: Positive for weakness.  All other systems reviewed and are negative.  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 PCCM intubating patient and he will be ready for EGD by 10: 30-10:40am   ESOPHAGOGASTRODUODENOSCOPY (EGD) WITH PROPOFOL N/A 01/31/2019   Procedure: ESOPHAGOGASTRODUODENOSCOPY (EGD) WITH PROPOFOL;  Surgeon: Juanita Craver, MD;  Location: [facility] ENDOSCOPY;  Service: Endoscopy;  Laterality: N/A;   HEMOSTASIS CLIP PLACEMENT  01/21/2019   Procedure: HEMOSTASIS CLIP PLACEMENT;  Surgeon: Yetta Flock, MD;  Location: [facility] ENDOSCOPY;  Service: Gastroenterology;;   HEMOSTASIS CONTROL  01/21/2019   Procedure: HEMOSTASIS CONTROL;  Surgeon: Yetta Flock, MD;  Location: [facility] ENDOSCOPY;  Service: Gastroenterology;;  Girtha Rm Probe   INSERTION OF DIALYSIS CATHETER Right 02/05/2019   Procedure: INSERTION OF DIALYSIS CATHETER RIGHT INTERNAL JUGULAR;  Surgeon: Elam Dutch, MD;  Location: [facility];  Service: Vascular;  Laterality: Right;   IR FLUORO GUIDE CV LINE RIGHT  01/20/2019   IR US GUIDE VASC ACCESS RIGHT  01/20/2019   SCLEROTHERAPY  01/21/2019   Procedure: Clide Deutscher;  Surgeon: Yetta Flock, MD;  Location: [facility] ENDOSCOPY;  Service: Gastroenterology;;   Family History  Problem Relation Age of Onset   Cancer Mother    Parkinson's disease Father    Social History:  reports that he has quit smoking. His smoking use included cigarettes. He has never used smokeless tobacco. He reports current alcohol use of about 5.0 standard drinks of alcohol per week. No history on file for drug. Allergies: No Known Allergies Medications Prior to Admission   Medication Sig Dispense Refill   aspirin 81 MG chewable tablet Chew 81 mg by mouth daily.     atorvastatin (LIPITOR) 10 MG tablet Take 10 mg by mouth daily.     ramipril (ALTACE) 2.5 MG capsule Take 2.5 mg by mouth daily.      Drug Regimen Review Drug regimen was reviewed and remains appropriate with no significant issues identified  Home: Home Living Family/patient expects to be discharged to:: Unsure Living Arrangements: Spouse/significant other Available Help at Discharge: Family, Available 24 hours/day Type of Home: House Home Access: Stairs to enter [employer] of Steps: 3 (uses door/frame) to hold on to Entrance Stairs-Rails: None Home Layout: Two level, Able to live on main level with bedroom/bathroom Bathroom Shower/Tub: Tub/shower unit, Architectural technologist: Standard Bathroom Accessibility: Yes Home Equipment: Environmental consultant - 2 wheels, Cane - single point  Lives With: Spouse   Functional History: Prior Function Level of Independence: Independent with assistive device(s) Comments: uses RW vs cane PRN. "On a good day" pt can ambulate short distances unassisted.   Was able to write and use R hand functionally  Functional Status:  Mobility: Bed Mobility Overal bed mobility: Needs Assistance Bed Mobility: Supine to Sit Rolling: Mod assist Supine to sit: Mod assist Sit to supine: Mod assist General bed mobility comments: mod A for trunk and LEs Transfers Overall transfer level: Needs assistance Equipment used: Rolling walker (2 wheeled) Transfer via Lift Equipment: Maximove Transfers: Sit to/from Stand, Risk manager Sit to Stand: Max assist Stand pivot transfers: +2 physical assistance, Min assist General transfer comment: pt performed sit <> stand from elevated bed with max A  with LT UE pulling up on RW.  once standing pt able to stand with min A 2 x 3 minutes for performing cleaning and hygiene Ambulation/Gait Ambulation/Gait assistance: Min  assist, +2 safety/equipment Gait Distance (Feet): 5 Feet Assistive device: Rolling walker (2 wheeled) Gait Pattern/deviations: Step-to pattern General Gait Details: pt declining ambulation anxiously awaiting a phone call Gait velocity: decr    ADL: ADL Overall ADL's : Needs assistance/impaired Eating/Feeding: Set up, Sitting Eating/Feeding Details (indicate cue type and reason): pt required assistance with removing lids Grooming: Set up, Sitting Upper Body Bathing: Minimal assistance, Sitting Upper Body Bathing Details (indicate cue type and reason): minA to wash LUE;limited functional use of RUE Lower Body Bathing: Total assistance, +2 for physical assistance, Sit to/from stand(pt 10%) Upper Body Dressing : Moderate assistance, Maximal assistance Lower Body Dressing: Total assistance, +2 for physical assistance, Sit to/from stand Toilet Transfer: Moderate assistance, +2 for physical assistance, Stand-pivot, Control and instrumentation engineer Details (indicate cue type and reason): pt decined ADL tasks this session due to fixation on waiting for an "important, private phone call" [place] and Hygiene: Maximal assistance, Moderate assistance Toileting - Clothing Manipulation Details (indicate cue type and reason): pt rolled R<>L for pericare Functional mobility during ADLs: Moderate assistance, +2 for physical assistance General ADL Comments: pt completed ADL while sitting in supported seat;use of maximove to get pt to chair to maximize pt's participation during session  Cognition: Cognition Overall Cognitive Status: Within Functional Limits for tasks assessed Orientation Level: Oriented X4 Cognition Arousal/Alertness: Awake/alert Behavior During Therapy: WFL for tasks assessed/performed Overall Cognitive Status: Within Functional Limits for tasks assessed  Physical Exam: Blood pressure (!) 116/47, pulse 86, temperature 98.6 F (37 C), temperature source Oral,  resp. rate 20, height '6\' 2"'  (1.88 m), weight 106.1 kg, SpO2 95 %. Physical Exam  Vitals reviewed. Constitutional: He appears well-developed.  Obese  HENT:  Head: Normocephalic and atraumatic.  Eyes: EOM are normal. Right eye exhibits no discharge. Left eye exhibits no discharge.  Respiratory: Effort normal. No respiratory distress.  GI: He exhibits no distension.  Musculoskeletal:     Comments: Generalized edema  Neurological: He is alert.  Mood is a bit flat but appropriate. Follows commands Motor: Left upper extremity: 5/5 proximal distal Left lower extremity: 4-4+/5 proximal distal Right upper extremity: 3-/5 proximal distal Right lower extremity: 2+/5 proximal to distal  Skin: Skin is warm and dry.  Sacral ulcer not examined  Psychiatric: He has a normal mood and affect. His behavior is normal.    Results for orders placed or performed during the hospital encounter of 01/14/19 (from the past 48 hour(s))  Renal function panel     Status: Abnormal   Collection Time: 02/09/19  6:01 AM  Result Value Ref Range   Sodium 136 135 - 145 mmol/L   Potassium 5.2 (H) 3.5 - 5.1 mmol/L   Chloride 100 98 - 111 mmol/L   CO2 23 22 - 32 mmol/L   Glucose, Bld 100 (H) 70 - 99 mg/dL   BUN 77 (H) 8 - 23 mg/dL   Creatinine, Ser 6.82 (H) 0.61 - 1.24 mg/dL   Calcium 8.9 8.9 - 10.3 mg/dL   Phosphorus 4.7 (H) 2.5 - 4.6 mg/dL   Albumin 3.3 (L) 3.5 - 5.0 g/dL   GFR calc non Af Amer 7 (L) >60 mL/min   GFR calc Af Amer 8 (L) >60 mL/min   Anion gap 13 5 - 15    Comment: Performed at [facility]  Lab, [address] [place], [place] [address]  CBC with Differential/Platelet     Status: Abnormal   Collection Time: 02/09/19  6:01 AM  Result Value Ref Range   WBC 7.7 4.0 - 10.5 K/uL   RBC 2.33 (L) 4.22 - 5.81 MIL/uL   Hemoglobin 7.6 (L) 13.0 - 17.0 g/dL   HCT 23.3 (L) 39.0 - 52.0 %   MCV 100.0 80.0 - 100.0 fL   MCH 32.6 26.0 - 34.0 pg   MCHC 32.6 30.0 - 36.0 g/dL   RDW 19.2 (H) 11.5 - 15.5 %    Platelets PLATELET CLUMPS NOTED ON SMEAR, UNABLE TO ESTIMATE 150 - 400 K/uL   nRBC 0.5 (H) 0.0 - 0.2 %   Neutrophils Relative % 72 %   Neutro Abs 5.5 1.7 - 7.7 K/uL   Lymphocytes Relative 13 %   Lymphs Abs 1.0 0.7 - 4.0 K/uL   Monocytes Relative 8 %   Monocytes Absolute 0.6 0.1 - 1.0 K/uL   Eosinophils Relative 1 %   Eosinophils Absolute 0.0 0.0 - 0.5 K/uL   Basophils Relative 0 %   Basophils Absolute 0.0 0.0 - 0.1 K/uL   Immature Granulocytes 6 %   Abs Immature Granulocytes 0.48 (H) 0.00 - 0.07 K/uL   Polychromasia PRESENT    Ovalocytes PRESENT     Comment: Performed at [facility], [place] [address] [place], [place] [address]  Hepatic function panel     Status: Abnormal   Collection Time: 02/09/19  6:01 AM  Result Value Ref Range   Total Protein 5.6 (L) 6.5 - 8.1 g/dL   Albumin 3.3 (L) 3.5 - 5.0 g/dL   AST 30 15 - 41 U/L   ALT 17 0 - 44 U/L   Alkaline Phosphatase 66 38 - 126 U/L   Total Bilirubin 0.9 0.3 - 1.2 mg/dL   Bilirubin, Direct 0.3 (H) 0.0 - 0.2 mg/dL   Indirect Bilirubin 0.6 0.3 - 0.9 mg/dL    Comment: Performed at [facility] [address] [place], [place] [address]  Magnesium     Status: None   Collection Time: 02/09/19  6:01 AM  Result Value Ref Range   Magnesium 2.3 1.7 - 2.4 mg/dL    Comment: Performed at [facility] [address], [place], [place] [address]  Renal function panel     Status: Abnormal   Collection Time: 02/10/19  5:42 AM  Result Value Ref Range   Sodium 136 135 - 145 mmol/L   Potassium 4.3 3.5 - 5.1 mmol/L   Chloride 97 (L) 98 - 111 mmol/L   CO2 26 22 - 32 mmol/L   Glucose, Bld 109 (H) 70 - 99 mg/dL   BUN 40 (H) 8 - 23 mg/dL   Creatinine, Ser 4.28 (H) 0.61 - 1.24 mg/dL    Comment: DELTA CHECK NOTED CONSISTENT WITH PREVIOUS RESULT    Calcium 9.1 8.9 - 10.3 mg/dL   Phosphorus 3.4 2.5 - 4.6 mg/dL   Albumin 3.3 (L) 3.5 - 5.0 g/dL   GFR calc non Af Amer 12 (L) >60 mL/min   GFR calc Af Amer 14 (L) >60 mL/min   Anion  gap 13 5 - 15    Comment: Performed at [facility] [address] [place], [place] [address]  CBC with Differential/Platelet     Status: Abnormal (Preliminary result)   Collection Time: 02/10/19  5:42 AM  Result Value Ref Range   WBC 6.6  4.0 - 10.5 K/uL   RBC 2.35 (L) 4.22 - 5.81 MIL/uL   Hemoglobin 7.7 (L) 13.0 - 17.0 g/dL   HCT 24.2 (L) 39.0 - 52.0 %   MCV 103.0 (H) 80.0 - 100.0 fL   MCH 32.8 26.0 - 34.0 pg   MCHC 31.8 30.0 - 36.0 g/dL   RDW 19.4 (H) 11.5 - 15.5 %   Platelets 185 150 - 400 K/uL   nRBC 0.8 (H) 0.0 - 0.2 %    Comment: Performed at [facility] [address] [place], [place] [address]   Neutrophils Relative % PENDING %   Neutro Abs PENDING 1.7 - 7.7 K/uL   Band Neutrophils PENDING %   Lymphocytes Relative PENDING %   Lymphs Abs PENDING 0.7 - 4.0 K/uL   Monocytes Relative PENDING %   Monocytes Absolute PENDING 0.1 - 1.0 K/uL   Eosinophils Relative PENDING %   Eosinophils Absolute PENDING 0.0 - 0.5 K/uL   Basophils Relative PENDING %   Basophils Absolute PENDING 0.0 - 0.1 K/uL   WBC Morphology PENDING    RBC Morphology PENDING    Smear Review PENDING    Other PENDING %   nRBC PENDING 0 /100 WBC   Metamyelocytes Relative PENDING %   Myelocytes PENDING %   Promyelocytes Relative PENDING %   Blasts PENDING %   No results found.  Medical Problem List and Plan: 1.  Debility secondary to acute renal failure/ascites/multi-medical  Admit to CIR 2.  Antithrombotics: Right brachiocephalic DVT 0/11/8887 -DVT/anticoagulation: Eliquis  -antiplatelet therapy: N/A 3. Pain Management: Hydrocodone as needed 4. Mood: Provide emotional support  -antipsychotic agents: N/A 5. Neuropsych: This patient is capable of making decisions on his own behalf. 6. Skin/Wound Care/multiple decubiti sacrum/heels.:  Prevalon boots and follow-up wound care nurse routine skin checks 7. Fluids/Electrolytes/Nutrition: Routine in and outs.  Follow-up BMP tomorrow a.m. 8.  GI  bleed.  EGD 01/21/2019.  Follow-up gastroenterology recommendations.  Follow-up CBC ordered for tomorrow a.m. 9.  ESRD.  Status post left brachiocephalic AV fistula.  Hemodialysis Tuesday Thursday Saturday.  Recommendation per nephro. 10.  Plasma cell myeloma.  Outpatient follow-up with oncology to discuss formal chemotherapy 11.  Right pleural effusion.  Status post thoracentesis 01/16/2019. 12.  Orthostasis.  ProAmatine 10 mg 3 times daily.  Monitor with increased mobility. 13.  Episodic SVT.  8 beats noted 02/08/2019.  Amiodarone as directed.  Monitor with increased mobility. 13.  History of CVA with recurrent right hemiparesis.  Post Admission Physician Evaluation: 1. Preadmission assessment reviewed and changes made below. 2. Functional deficits secondary  to debility. 3. Patient is admitted to receive collaborative, interdisciplinary care between the physiatrist, rehab nursing staff, and therapy team. 4. Patient's level of medical complexity and substantial therapy needs in context of that medical necessity cannot be provided at a lesser intensity of care such as a SNF. 5. Patient has experienced substantial functional loss from his/her baseline which was documented above under the "Functional History" and "Functional Status" headings.  Judging by the patient's diagnosis, physical exam, and functional history, the patient has potential for functional progress which will result in measurable gains while on inpatient rehab.  These gains will be of substantial and practical use upon discharge  in facilitating mobility and self-care at the household level. 6. Physiatrist will provide 24 hour management of medical needs as well as oversight of the therapy plan/treatment and provide guidance as appropriate regarding the interaction of the two. 7. 24 hour rehab  nursing will assist with safety, skin/wound care, disease management, pain management and patient education  and help integrate therapy concepts,  techniques,education, etc. 8. PT will assess and treat for/with: Lower extremity strength, range of motion, stamina, balance, functional mobility, safety, adaptive techniques and equipment, wound care, coping skills, pain control, education. Goals are: Min A. 9. OT will assess and treat for/with: ADL's, functional mobility, safety, upper extremity strength, adaptive techniques and equipment, wound mgt, ego support, and community reintegration.   Goals are: Min A. Therapy may proceed with showering this patient. 10. Case Management and Social Worker will assess and treat for psychological issues and discharge planning. 11. Team conference will be held weekly to assess progress toward goals and to determine barriers to discharge. 12. Patient will receive at least 3 hours of therapy per day at least 5 days per week. 13. ELOS: 17-20 days.       14. Prognosis:  good  I have personally performed a face to face diagnostic evaluation, including, but not limited to relevant history and physical exam findings, of this patient and developed relevant assessment and plan.  Additionally, I have reviewed and concur with the physician assistant's documentation above.  Delice Lesch, MD, ABPMR Lavon Paganini Angiulli, PA-C 02/10/2019

## 2019-02-10 NOTE — Discharge Summary (Addendum)
Physician Discharge Summary  Scott Mcdowell SLP:[id number] DOB: [birth year] DOA: 01/14/2019  PCP: [name], MD  Admit date: 01/14/2019 Discharge date: 02/10/2019  Admitted From: Home Disposition: CIR  Recommendations for Outpatient Follow-up:  1. Follow up with PCP in 1 week 2. Follow up with oncology as an outpatient 3. Please obtain BMP/CBC in one week 4. Please follow up on the following pending results: None  Discharge Condition: Stable CODE STATUS: Full code Diet recommendation: Renal diet   Brief/Interim Summary:  Admission HPI written by Jani Gravel, MD    HPI:   Scott Mcdowell  is a 78 y.o. male, w CVA, apparently seen by pcp today, and sent to ER for evaluation of scrotal swelling, and ? Cellulitis.  Pt notes sob for a few weeks. Pt denies fever, chills, cough, cp, palp, n/v, abd pain, diarrhea, brbpr, black stool, dysuria.   Pt went to [facility] ER and sent to [facility] for evaluation.   In ED T 98.1  P 85 Bp 101/58  Pox 95% R 18 Wt 235lbs  Wbc 7.7, Hgb 8.1, Plt 119  (+ bandemia)  Na 137, K 5.6,  Bun 80, Creatinine 2.5 Calcium 11.6 BNP 13200 Ast 30, Alt 26  Trop 0.07 (high)  EKG nsr at 80, nl axis,  Nl int, poor quality.   CT chest/abd/ pelvis Impression Moderate R pleural effusion associated with atelectasis or consolidation.  Mild left basilar scarring of atelectasis  Scrotal edema and bilateral hydroceles.  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   Extensive 3 vessel coronary artery calcifications and aortic valve calcifications .  Testicular  ultrasound Impression No evidence of testisticular mass, no derate bilateral hydroceles are noted.  Extensive scrotal wall thickening, suggesting edema/ inflammation.   procalcitonin 0.07 (high)  SARS negative (cepheid)  Pt will be admitted for CAP, w right pleural effusion, bilateral hydrocele, asciates, and hypercalcemia, and  ARF and troponin elevation.     Hospital course:  ESRD Hyperkalemia Initially managed with diuresis with poor effect. While in ICU, he was started on CRRT. Deemed ESRD. No urine output documented over last 24 hours. Dialysis catheter and left brachial cephalic AV fistula placed on 6/19 by IR. Outpatient HD arranged.  Hypotension/shock Patient required the use of vasopressors while in the ICU. Concern for relative adrenal insufficiency in the ICU and has been started on hydrocortisone in addition to midodrine. Blood pressure still soft and acutely worsened in setting of worsening anemia and recurrent melena. Hydrocortisone discontinued. Continue midodrine.  Anemia Thrombocytopenia Acute blood loss anemia Upper GI bleeding Hemoglobin stable. Recent EGD performed on 6/4 and significant for multiple areas of bleeding which were treated with clip and epi. Iron studies normal except a mildly elevated ferritin. GI signed off and recommending to restart anticoagulation as indicated. Unfortunately, evidence he is possibly re-bleeding. To date, he has had 8 units of PRBC, 1 units of platelets and 3 units of FFP. Repeat EGD (6/14) without source of active bleeding. Hemoglobin stable.  Right upper extremity DVT Treatment complicated by anemia requiring multiple transfusions, in addition to epistaxis and hemoptysis. Hemoglobin stable currently on Eliquis in setting of  history of GI bleeding.  Tachypnea/Dyspnea Likely related to fluid overload from need for HD. Improved.  Pneumonia Associated right para-pneumonia effusion. Completed antibiotic course. Also  underwent thoracentesis. Resolved  Ventricular tachycardia Eight beats. Normal EF. Started on amiodarone. Amiodarone discontinued prior to discharge. No recurrence noted.  Hyperkalemia Secondary to renal failure. Resolved with HD.  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 Given a course of IV decadron. Outpatient hematology follow-up.  Scrotal cellulitis Bilateral hydrocele Cellulitis resolved and scrotal swelling improved.  History of CVA Associated right sided deficits. Per patient, he was able to ambulate pretty well prior to current illness. PT/OT recommending CIR. CIR recommends consideration when patient is closer to discharge/oncology recs. Continues to work with PT.  Pressure injuries Sacrum, left heel, right heel, POA. Left buttock unknown if present on admission  Cirrhosis Seen on CT. Will need outpatient follow-up.  Obesity Body mass index is 30.88 kg/m.  Discharge Diagnoses:  Principal Problem:   Shock ([place]) Active Problems:   ARF (acute renal failure) (HCC)   Hyperkalemia   B12 deficiency anemia   Pleural effusion on right   Ascites   CAP (community acquired pneumonia)   Elevated troponin   Acute blood loss anemia   Upper GI bleed   Goals of care, counseling/discussion   Lambda light chain myeloma (HCC)   Pressure injury of skin    Discharge Instructions   Allergies as of 02/10/2019   No Known Allergies     Medication List    STOP taking these medications   aspirin 81 MG chewable tablet   ramipril 2.5 MG capsule Commonly known as: ALTACE     TAKE these medications   apixaban 5 MG Tabs tablet Commonly known as: ELIQUIS Take 1 tablet (5 mg total) by mouth 2 (two) times daily.   atorvastatin 10 MG tablet Commonly known as: LIPITOR Take 10 mg by mouth daily.   Darbepoetin Alfa 200 MCG/0.4ML Sosy injection Commonly known as: ARANESP Inject 0.4 mLs (200 mcg total) into the vein every Tuesday  with hemodialysis. Start taking on: February 16, 2019   feeding supplement (NEPRO CARB STEADY) Liqd Take 237 mLs by mouth 3 (three) times daily between meals.   midodrine 10 MG tablet Commonly known as: PROAMATINE Take 1 tablet (10 mg total) by mouth 3 (three) times daily with meals.   multivitamin Tabs tablet Take 1 tablet by mouth at bedtime.   nystatin powder Commonly known as: MYCOSTATIN/NYSTOP Apply topically 3 (three) times daily.   pantoprazole 40 MG tablet Commonly known as: PROTONIX Take 1 tablet (40 mg total) by mouth daily. Start taking on: February 11, 2019   polyethylene glycol 17 g packet Commonly known as: MIRALAX / GLYCOLAX Take 17 g by mouth daily. Start taking on: February 11, 2019   senna-docusate 8.6-50 MG tablet Commonly known as: Senokot-S Take 1 tablet by mouth at bedtime.   sevelamer carbonate 2.4 g Pack Commonly known as: RENVELA Take 2.4 g by mouth 3 (three) times daily with meals.       No Known Allergies  Consultations:  PCCM  Nephrology  [facility]   Procedures/Studies: Dg Chest 1 View  Result Date: 01/16/2019 CLINICAL DATA:  Post thoracentesis EXAM: CHEST  1 VIEW COMPARISON:  01/14/2019 FINDINGS: Normal heart size. Low volumes. Bibasilar atelectasis. There is no pneumothorax post right thoracentesis. IMPRESSION: No pneumothorax post right thoracentesis. Electronically Signed   By: Marybelle Killings M.D.   On: 01/16/2019 14:42   Dg Abd  1 View  Result Date: 01/20/2019 CLINICAL DATA:  NG tube positioning EXAM: ABDOMEN - 1 VIEW COMPARISON:  None. FINDINGS: The NG tube tip projects over the gastric antrum. The tip is pointed distally. The bowel gas pattern is nonspecific and nonobstructive. IMPRESSION: NG tube tip projects over the gastric antrum. The tip is pointed distally. Electronically Signed   By: Constance Holster M.D.   On: 01/20/2019 22:37   US Abdomen Complete  Result Date: 01/17/2019 CLINICAL DATA:  Chronic kidney disease EXAM: ABDOMEN  ULTRASOUND COMPLETE COMPARISON:  CT 01/14/2019 FINDINGS: Gallbladder: Gallbladder wall is mildly thickened at 5 mm. No visible stones or sonographic Murphy's sign. Common bile duct: Diameter: Normal caliber, 3 mm Liver: Coarsened echotexture and nodular contours of the liver suggests cirrhosis. No focal hepatic abnormality. Portal vein is patent on color Doppler imaging with normal direction of blood flow towards the liver. IVC: Prominent IVC and and hepatic veins which can be seen with right heart failure. Pancreas: Visualized portion unremarkable. Spleen: Size and appearance within normal limits. Right Kidney: Length: 12.3 cm. Echogenicity within normal limits. No mass or hydronephrosis. Left Kidney: Length: 11.5 cm. Echogenicity within normal limits. No mass or hydronephrosis. Abdominal aorta: No aneurysm visualized. Other findings: Ascites noted around the liver and spleen. IMPRESSION: Heterogeneous echotexture throughout the liver with subtle nodularity of the contours suggesting cirrhosis. Gallbladder wall thickening. Burtis Junes this is related to liver disease. Perihepatic and perisplenic ascites. Mildly dilated IVC and hepatic veins can be seen with right heart failure. Electronically Signed   By: Rolm Baptise M.D.   On: 01/17/2019 19:25   Ir Fluoro Guide Cv Line Right  Result Date: 01/20/2019 INDICATION: Renal insufficiency. In need of intravenous access for the initiation of dialysis. EXAM: NON-TUNNELED CENTRAL VENOUS HEMODIALYSIS CATHETER PLACEMENT WITH ULTRASOUND AND FLUOROSCOPIC GUIDANCE COMPARISON:  Chest CT-01/14/2019 MEDICATIONS: None FLUOROSCOPY TIME:  24 seconds (10 mGy) COMPLICATIONS: None immediate. PROCEDURE: Informed written consent was obtained from the patient after a discussion of the risks, benefits, and alternatives to treatment. Questions regarding the procedure were encouraged and answered. The right neck and chest were prepped with chlorhexidine in a sterile fashion, and a sterile drape  was applied covering the operative field. Maximum barrier sterile technique with sterile gowns and gloves were used for the procedure. A timeout was performed prior to the initiation of the procedure. After the overlying soft tissues were anesthetized, a small venotomy incision was created and a micropuncture kit was utilized to access the internal jugular vein. Real-time ultrasound guidance was utilized for vascular access including the acquisition of a permanent ultrasound image documenting patency of the accessed vessel. The microwire was utilized to measure appropriate catheter length. A stiff glidewire was advanced to the level of the IVC. Under fluoroscopic guidance, the venotomy was serially dilated, ultimately allowing placement of a 20 cm temporary Trialysis catheter with tip ultimately terminating within the superior aspect of the right atrium. Final catheter positioning was confirmed and documented with a spot radiographic image. The catheter aspirates and flushes normally. The catheter was flushed with appropriate volume heparin dwells. The catheter exit site was secured with a 0-Prolene retention suture. A dressing was placed. The patient tolerated the procedure well without immediate post procedural complication. IMPRESSION: Successful placement of a right internal jugular approach 20 cm temporary dialysis catheter with tip terminating with in the superior aspect of the right atrium. The catheter is ready for immediate use. PLAN: This catheter may be converted to a tunneled dialysis catheter at a later  date as indicated. Electronically Signed   By: Sandi Mariscal M.D.   On: 01/20/2019 17:24   Ir US Guide Vasc Access Right  Result Date: 01/21/2019 INDICATION: Renal insufficiency. In need of intravenous access for the initiation of dialysis. EXAM: NON-TUNNELED CENTRAL VENOUS HEMODIALYSIS CATHETER PLACEMENT WITH ULTRASOUND AND FLUOROSCOPIC GUIDANCE COMPARISON:  Chest CT-01/14/2019 MEDICATIONS: None  FLUOROSCOPY TIME:  24 seconds (10 mGy) COMPLICATIONS: None immediate. PROCEDURE: Informed written consent was obtained from the patient after a discussion of the risks, benefits, and alternatives to treatment. Questions regarding the procedure were encouraged and answered. The right neck and chest were prepped with chlorhexidine in a sterile fashion, and a sterile drape was applied covering the operative field. Maximum barrier sterile technique with sterile gowns and gloves were used for the procedure. A timeout was performed prior to the initiation of the procedure. After the overlying soft tissues were anesthetized, a small venotomy incision was created and a micropuncture kit was utilized to access the internal jugular vein. Real-time ultrasound guidance was utilized for vascular access including the acquisition of a permanent ultrasound image documenting patency of the accessed vessel. The microwire was utilized to measure appropriate catheter length. A stiff glidewire was advanced to the level of the IVC. Under fluoroscopic guidance, the venotomy was serially dilated, ultimately allowing placement of a 20 cm temporary Trialysis catheter with tip ultimately terminating within the superior aspect of the right atrium. Final catheter positioning was confirmed and documented with a spot radiographic image. The catheter aspirates and flushes normally. The catheter was flushed with appropriate volume heparin dwells. The catheter exit site was secured with a 0-Prolene retention suture. A dressing was placed. The patient tolerated the procedure well without immediate post procedural complication. IMPRESSION: Successful placement of a right internal jugular approach 20 cm temporary dialysis catheter with tip terminating with in the superior aspect of the right atrium. The catheter is ready for immediate use. PLAN: This catheter may be converted to a tunneled dialysis catheter at a later date as indicated. Electronically  Signed   By: Sandi Mariscal M.D.   On: 01/20/2019 17:24   Ct Biopsy  Result Date: 01/26/2019 CLINICAL DATA:  Thrombocytopenia and anemia. Bone marrow biopsy requested for further workup. EXAM: CT GUIDED BONE MARROW ASPIRATION AND BIOPSY ANESTHESIA/SEDATION: Versed 2.0 mg IV, Fentanyl 100 mcg IV Total Moderate Sedation Time:   10 minutes. The patient's level of consciousness and physiologic status were continuously monitored during the procedure by Radiology nursing. PROCEDURE: The procedure risks, benefits, and alternatives were explained to the patient. Questions regarding the procedure were encouraged and answered. The patient understands and consents to the procedure. A time out was performed prior to initiating the procedure. The right gluteal region was prepped with chlorhexidine. Sterile gown and sterile gloves were used for the procedure. Local anesthesia was provided with 1% Lidocaine. Under CT guidance, an 11 gauge On Control bone cutting needle was advanced from a posterior approach into the right iliac bone. Needle positioning was confirmed with CT. Initial non heparinized and heparinized aspirate samples were obtained of bone marrow. Core biopsy was performed via the On Control drill needle. COMPLICATIONS: None FINDINGS: Inspection of initial aspirate did reveal visible particles. Intact core biopsy sample was obtained. IMPRESSION: CT guided bone marrow biopsy of right posterior iliac bone with both aspirate and core samples obtained. Electronically Signed   By: Aletta Edouard M.D.   On: 01/26/2019 09:21   Dg Chest Port 1 View  Result Date: 02/05/2019 CLINICAL DATA:  Dialysis. EXAM: PORTABLE CHEST 1 VIEW COMPARISON:  02/04/2019. FINDINGS: Dialysis catheter noted with tip projected over the right atrium. Cardiomegaly with pulmonary venous congestion and bilateral interstitial prominence. Right-sided pleural effusion. Findings consistent with CHF. Pneumonitis cannot be excluded. Lytic lesion noted the  right posterior fifth rib and possibly the right posterolateral seventh rib. A process such as myeloma or metastatic disease could present in this fashion. IMPRESSION: 1.  Dialysis catheter noted with tip projected over right atrium. 2. Cardiomegaly with pulmonary venous congestion bilateral interstitial prominence and right-sided pleural effusion. Findings most consistent with CHF. 3. Lytic lesion noted in the right posterior fifth rib and possibly the right posterolateral seventh rib. A process such as myeloma or metastatic disease could present in this fashion. Electronically Signed   By: Marcello Moores  Register   On: 02/05/2019 12:33   Dg Chest Port 1 View  Result Date: 02/04/2019 CLINICAL DATA:  Post dialysis EXAM: PORTABLE CHEST 1 VIEW COMPARISON:  01/26/2019 FINDINGS: Heart is enlarged. Right jugular central venous catheter tip proximal right atrium unchanged. Left jugular catheter tip mid SVC unchanged Pulmonary vascular congestion and mild edema shows mild interval improvement. Bibasilar atelectasis right greater than left unchanged. Small right pleural effusion unchanged. IMPRESSION: Mild interval improvement in bilateral airspace disease most likely edema. Bibasilar atelectasis right greater than left and small right effusion unchanged. Electronically Signed   By: Franchot Gallo M.D.   On: 02/04/2019 15:43   Dg Chest Port 1 View  Result Date: 01/26/2019 CLINICAL DATA:  Pulmonary edema, shortness of breath EXAM: PORTABLE CHEST 1 VIEW COMPARISON:  01/23/2019 FINDINGS: Right Vas-Cath, left central line remain in place, unchanged. Cardiomegaly with vascular congestion. Improving bilateral interstitial and airspace opacities, likely improving edema. Focal right lower lobe opacity. Possible small effusions. IMPRESSION: Improving diffuse interstitial and airspace opacities, likely improving edema. Focal right lower lobe infiltrate. Suspect small effusions. Electronically Signed   By: Rolm Baptise M.D.   On:  01/26/2019 09:30   Dg Chest Port 1 View  Result Date: 01/23/2019 CLINICAL DATA:  Acute respiratory failure EXAM: PORTABLE CHEST 1 VIEW COMPARISON:  Chest radiograph 01/22/2019 FINDINGS: Central venous catheter tip projects over the superior vena cava. Dual lumen central venous catheter tip projects over the superior vena cava. Monitoring leads overlie the patient. Stable cardiomegaly. Aortic atherosclerosis. Interval extubation. Low lung volumes. Layering right pleural effusion. Diffuse bilateral interstitial opacities, mildly increased, right greater than left. No pneumothorax. IMPRESSION: Interval extubation. Persistent layering right pleural effusion. Mild increased bilateral airspace opacities, right greater than left. Layering right pleural effusion. Electronically Signed   By: Lovey Newcomer M.D.   On: 01/23/2019 05:42   Portable Chest Xray  Result Date: 01/22/2019 CLINICAL DATA:  Endotracheal tube present. EXAM: PORTABLE CHEST 1 VIEW COMPARISON:  Radiograph January 21, 2019. FINDINGS: Stable cardiomegaly. Endotracheal tube is in good position. Right internal jugular dialysis catheter is unchanged. Left internal jugular catheter is unchanged. No pneumothorax is noted. Left lung is clear. Stable right basilar atelectasis or infiltrate is noted with associated pleural effusion. Bony thorax is unremarkable. IMPRESSION: Stable support apparatus. Stable right basilar opacity as described above. Aortic Atherosclerosis (ICD10-I70.0). Electronically Signed   By: Marijo Conception M.D.   On: 01/22/2019 07:48   Dg Chest Port 1 View  Result Date: 01/21/2019 CLINICAL DATA:  Encounter for central line placement. EXAM: PORTABLE CHEST 1 VIEW COMPARISON:  Radiograph of January 21, 2019. FINDINGS: Mild cardiomegaly is noted. Endotracheal tube is in grossly good position. Nasogastric tube has been removed.  No pneumothorax is noted. Stable position of right internal jugular dialysis catheter with distal tip in expected position of  right atrium. Interval placement of left internal jugular catheter with distal tip in expected position of the SVC. Left lung is clear. Mild right basilar atelectasis and small pleural effusion is noted. Bony thorax is unremarkable. IMPRESSION: Interval placement of left internal jugular catheter with distal tip in expected position of SVC. Nasogastric tube has been removed. Otherwise stable support apparatus. No pneumothorax is noted. Mild right basilar atelectasis and effusion is noted. Electronically Signed   By: Marijo Conception M.D.   On: 01/21/2019 16:21   Portable Chest X-ray  Result Date: 01/21/2019 CLINICAL DATA:  Endotracheal tube EXAM: PORTABLE CHEST 1 VIEW COMPARISON:  Yesterday FINDINGS: Endotracheal tube tip between the clavicular heads and carina. Orogastric tube reaches the stomach. Dialysis catheter with tip at the upper cavoatrial junction. Cardiomegaly, stable. Haziness of the right more than left lower chest attributed atelectasis and pleural fluid. Right lateral fifth rib lucent lesion that appears aggressive on prior imaging. These results were called by telephone at the time of interpretation on 01/21/2019 at 11:05 am to Dr. Noe Gens , who verbally acknowledged these results. IMPRESSION: 1. New endotracheal and orogastric tubes in expected position. Mildly improved lung volumes. 2. Lytic lesion in the lateral right fifth rib needing workup for malignancy Electronically Signed   By: Monte Fantasia M.D.   On: 01/21/2019 11:06   Dg Chest Port 1 View  Result Date: 01/20/2019 CLINICAL DATA:  Central line placement EXAM: PORTABLE CHEST 1 VIEW COMPARISON:  01/16/2019 FINDINGS: There is a well-positioned non tunneled right-sided central venous catheter with tip projecting over the right atrium. The cardiac silhouette is enlarged. There is no pneumothorax. There is a moderate sized right-sided pleural effusion. There is a small left-sided pleural effusion. There is generalized volume overload  with evidence of pulmonary edema. IMPRESSION: 1. Well-positioned right-sided dialysis catheter.  No pneumothorax. 2. Cardiomegaly with volume overload including small to moderate-sized bilateral pleural effusions. Electronically Signed   By: Constance Holster M.D.   On: 01/20/2019 20:15   Dg Bone Survey Met  Result Date: 01/30/2019 CLINICAL DATA:  Renal failure, myeloma EXAM: METASTATIC BONE SURVEY COMPARISON:  Chest radiograph 01/26/2019 FINDINGS: Diffuse osseous demineralization. Extensive atherosclerotic calcifications at the carotid bifurcations, aorta, and lower extremities BILATERAL jugular lines. Enlargement of cardiac silhouette with vascular congestion. RIGHT pleural effusion and suspect minimal pulmonary edema. 5 mm lucency LEFT parietal calvarium. 10 mm lucency mid RIGHT humeral diaphysis. Questionable lucencies at the proximal humeri bilaterally. Healing fracture of the lateral RIGHT fifth rib. Questionable small lucency at mid RIGHT femoral diaphysis. No additional destructive bone lesions. Multilevel degenerative disc disease changes of the cervical, thoracic and lumbar spine. Degenerative changes of BILATERAL AC joints. IMPRESSION: Lucencies in the LEFT parietal calvarium and in the RIGHT humeral diaphysis with additional questionable lucencies of the proximal humeri and RIGHT femoral diaphysis, cannot exclude lesions from multiple myeloma. Electronically Signed   By: Lavonia Dana M.D.   On: 01/30/2019 13:54   Ct Bone Marrow Biopsy & Aspiration  Result Date: 01/26/2019 CLINICAL DATA:  Thrombocytopenia and anemia. Bone marrow biopsy requested for further workup. EXAM: CT GUIDED BONE MARROW ASPIRATION AND BIOPSY ANESTHESIA/SEDATION: Versed 2.0 mg IV, Fentanyl 100 mcg IV Total Moderate Sedation Time:   10 minutes. The patient's level of consciousness and physiologic status were continuously monitored during the procedure by Radiology nursing. PROCEDURE: The procedure risks, benefits, and  alternatives were  explained to the patient. Questions regarding the procedure were encouraged and answered. The patient understands and consents to the procedure. A time out was performed prior to initiating the procedure. The right gluteal region was prepped with chlorhexidine. Sterile gown and sterile gloves were used for the procedure. Local anesthesia was provided with 1% Lidocaine. Under CT guidance, an 11 gauge On Control bone cutting needle was advanced from a posterior approach into the right iliac bone. Needle positioning was confirmed with CT. Initial non heparinized and heparinized aspirate samples were obtained of bone marrow. Core biopsy was performed via the On Control drill needle. COMPLICATIONS: None FINDINGS: Inspection of initial aspirate did reveal visible particles. Intact core biopsy sample was obtained. IMPRESSION: CT guided bone marrow biopsy of right posterior iliac bone with both aspirate and core samples obtained. Electronically Signed   By: Aletta Edouard M.D.   On: 01/26/2019 09:21   Dg Fluoro Guide Cv Line-no Report  Result Date: 02/05/2019 Fluoroscopy was utilized by the requesting physician.  No radiographic interpretation.   Vas [place] Upper Ext Vein Mapping (pre-op Avf)  Result Date: 02/02/2019 UPPER EXTREMITY VEIN MAPPING  Indications: Pre-access. Performing Technologist: June Leap RDMS, RVT  Examination Guidelines: A complete evaluation includes B-mode imaging, spectral Doppler, color Doppler, and power Doppler as needed of all accessible portions of each vessel. Bilateral testing is considered an integral part of a complete examination. Limited examinations for reoccurring indications may be performed as noted. +-----------------+-------------+----------+---------+  Right Cephalic    Diameter (cm) Depth (cm) Findings   +-----------------+-------------+----------+---------+  Shoulder              0.38         0.75                +-----------------+-------------+----------+---------+  Mid upper arm         0.43         0.40               +-----------------+-------------+----------+---------+  Dist upper arm        0.48         0.68               +-----------------+-------------+----------+---------+  Antecubital fossa     0.53         0.84               +-----------------+-------------+----------+---------+  Prox forearm          0.39         0.36               +-----------------+-------------+----------+---------+  Mid forearm           0.37         0.47    branching  +-----------------+-------------+----------+---------+  Dist forearm          0.35         0.27               +-----------------+-------------+----------+---------+ +-----------------+-------------+----------+---------+  Left Cephalic     Diameter (cm) Depth (cm) Findings   +-----------------+-------------+----------+---------+  Shoulder              0.42         0.71               +-----------------+-------------+----------+---------+  Mid upper arm         0.34         0.62               +-----------------+-------------+----------+---------+  Dist upper arm        0.42         0.40               +-----------------+-------------+----------+---------+  Antecubital fossa     0.37         0.34    branching  +-----------------+-------------+----------+---------+  Prox forearm          0.31         0.21               +-----------------+-------------+----------+---------+  Mid forearm           0.32         0.18               +-----------------+-------------+----------+---------+  Dist forearm          0.34         0.20               +-----------------+-------------+----------+---------+ *See table(s) above for measurements and observations.  Diagnosing physician: Deitra Mayo MD Electronically signed by Deitra Mayo MD on 02/02/2019 at 3:41:59 PM.    Final    Vas [place] Upper Extremity Venous Duplex  Result Date: 01/19/2019 UPPER VENOUS STUDY  Indications: Swelling  Performing Technologist: Maudry Mayhew MHA, RDMS, RVT, RDCS  Examination Guidelines: A complete evaluation includes B-mode imaging, spectral Doppler, color Doppler, and power Doppler as needed of all accessible portions of each vessel. Bilateral testing is considered an integral part of a complete examination. Limited examinations for reoccurring indications may be performed as noted.  Right Findings: +----------+------------+---------+-----------+----------+-------+  RIGHT      Compressible Phasicity Spontaneous Properties Summary  +----------+------------+---------+-----------+----------+-------+  IJV            Full        Yes        Yes                         +----------+------------+---------+-----------+----------+-------+  Subclavian     Full        Yes        Yes                         +----------+------------+---------+-----------+----------+-------+  Axillary       Full        Yes        Yes                         +----------+------------+---------+-----------+----------+-------+  Brachial       None                   No                  Acute   +----------+------------+---------+-----------+----------+-------+  Radial         Full                                               +----------+------------+---------+-----------+----------+-------+  Ulnar          Full                                               +----------+------------+---------+-----------+----------+-------+  Cephalic       Full                                               +----------+------------+---------+-----------+----------+-------+  Basilic        Full                                               +----------+------------+---------+-----------+----------+-------+  Left Findings: +----------+------------+---------+-----------+----------+-------+  LEFT       Compressible Phasicity Spontaneous Properties Summary  +----------+------------+---------+-----------+----------+-------+  Subclavian                 Yes        Yes                          +----------+------------+---------+-----------+----------+-------+  Summary:  Right: No evidence of superficial vein thrombosis in the upper extremity. Findings consistent with acute deep vein thrombosis involving the right brachial veins.  Left: No evidence of thrombosis in the subclavian.  *See table(s) above for measurements and observations.  Diagnosing physician: Deitra Mayo MD Electronically signed by Deitra Mayo MD on 01/19/2019 at 4:52:34 PM.    Final    US Thoracentesis Asp Pleural Space W/img Guide  Result Date: 01/16/2019 INDICATION: Shortness of breath. Possible right lower lobe pneumonia. Right pleural effusion. Request for diagnostic and therapeutic thoracentesis. EXAM: ULTRASOUND GUIDED RIGHT THORACENTESIS MEDICATIONS: 1% lidocaine 10 mL COMPLICATIONS: None immediate. PROCEDURE: An ultrasound guided thoracentesis was thoroughly discussed with the patient and questions answered. The benefits, risks, alternatives and complications were also discussed. The patient understands and wishes to proceed with the procedure. Written consent was obtained. Ultrasound was performed to localize and mark an adequate pocket of fluid in the right chest. The area was then prepped and draped in the normal sterile fashion. 1% Lidocaine was used for local anesthesia. Under ultrasound guidance a 6 Fr Safe-T-Centesis catheter was introduced. Thoracentesis was performed. The catheter was removed and a dressing applied. FINDINGS: A total of approximately 600 mL of clear yellow fluid was removed. Samples were sent to the laboratory as requested by the clinical team. IMPRESSION: Successful ultrasound guided right thoracentesis yielding 600 mL of pleural fluid. No pneumothorax on post-procedure chest x-ray. Read by: Gareth Eagle, PA-C Electronically Signed   By: Jerilynn Mages.  Shick M.D.   On: 01/16/2019 14:54      6/4: EGD Impression: - Normal esophagus. -  Adherent clot in the distal gastric body, unclear  if NG tube trauma but treated with hemostasis clip  and epi. Friable mucosa in the proximal stomach  with contact oozing. - Red blood with active bleeding and clots in the  duodenal bulb. 4 discrete areas of adherent clot  with extremely friable tissue but no focal  ulceration appreciated. One of these areas had a  visible vessel which was bleeding, another lesion  had active oozing. 2 areas treated as outlined  above. 4 clips placed total in the duodenal bulb.  Patient with lytic lesions noted on xray, unclear  if he has underlying myeloma? These lesions appear  to have high risk for relbeeding in the setting of  coagulopathy. No active bleeding noted following  endoscopic intervention.  Recommendation: - Patient to remain  in the ICU for ongoing care. - NPO. - Continue present medications including IV PPI - Serial Hgb - Further workup pending course. If he survives  these acute issues, may need repeat EGD to  re-evaluate duodenal bulb following treatment with  PPI and workup underlying liver disease   6/14: EGD Impression: - Normal appearing, widely patent esophagus and GEJ. - Moderate patchy gastritis. - Small hiatal hernia. - Erythematous  duodenopathy. - No specimens collected. Recommendation: - Full liquid diet today. - Continue present medications. - Serial CBC's.   Subjective: No concerns today.  Discharge Exam: Vitals:   02/10/19 0513 02/10/19 0945  BP: (!) 116/47 (!) 143/42  Pulse: 86 84  Resp: 20 18  Temp: 98.6 F (37 C) 98 F (36.7 C)  SpO2: 95% 97%   Vitals:   02/09/19 1744 02/09/19 2237 02/10/19 0513 02/10/19 0945  BP: (!) 96/55 (!) 99/46 (!) 116/47 (!) 143/42  Pulse: 95 95 86 84  Resp: 18 (!) '22 20 18  ' Temp: 98.4 F (36.9 C) 98.1 F (36.7 C) 98.6 F (37 C) 98 F (36.7 C)  TempSrc: Oral Oral Oral Oral  SpO2: 97% 97% 95% 97%  Weight:   107.7 kg   Height:        General: Pt is alert, awake, not in acute distress Cardiovascular: RRR, S1/S2 +, no rubs, no gallops. 2/6 systolic murmur Respiratory: CTA bilaterally, no wheezing, no rhonchi Abdominal: Soft, NT, ND, bowel sounds + Extremities: RUE edema, no cyanosis    The results of significant diagnostics from this hospitalization (including imaging, microbiology, ancillary and laboratory) are listed below for reference.     Labs:  Basic Metabolic Panel: Recent Labs  Lab 02/06/19 1005 02/07/19 0816 02/08/19 0547 02/09/19 0601 02/10/19 0542  NA 137 137 135 136 136  K 5.0 5.0 4.8 5.2* 4.3  CL 99 100 99 100 97*  CO2 '23 24 24 23 26  ' GLUCOSE 152* 125* 104* 100* 109*  BUN 79* 44* 59* 77* 40*  CREATININE 6.50* 4.61* 5.84* 6.82* 4.28*  CALCIUM 8.8* 8.8* 8.9 8.9 9.1  MG  --   --   --  2.3  --   PHOS 4.5 3.9 4.2 4.7* 3.4   Liver Function Tests: Recent Labs  Lab 02/06/19 1005 02/07/19 0816 02/08/19 0547 02/09/19 0601 02/10/19 0542  AST  --   --   --  30  --   ALT  --   --   --  17  --   ALKPHOS  --   --   --  66  --   BILITOT  --   --   --  0.9  --   PROT  --   --   --  5.6*  --   ALBUMIN 3.5 3.3* 3.3* 3.3*   3.3* 3.3*   CBC: Recent Labs   Lab 02/04/19 0428 02/05/19 1824 02/06/19 1342 02/09/19 0601 02/10/19 0542  WBC 6.6 8.7 8.1 7.7 6.6  NEUTROABS  --   --   --  5.5 4.6  HGB 7.5* 7.2* 7.2* 7.6* 7.7*  HCT 23.8* 23.3* 23.2* 23.3* 24.2*  MCV 103.5* 105.4* 105.5* 100.0 103.0*  PLT 136* 164 169 PLATELET CLUMPS NOTED ON SMEAR, UNABLE TO ESTIMATE 185   Urinalysis    Component Value Date/Time   COLORURINE YELLOW 01/17/2019 1029   APPEARANCEUR HAZY (A) 01/17/2019 1029   LABSPEC 1.012 01/17/2019 1029   PHURINE 5.0 01/17/2019 1029   GLUCOSEU NEGATIVE 01/17/2019 1029   [place]  01/17/2019 [place] 01/17/2019 1029   KETONESUR 5 (A) 01/17/2019 [place] 01/17/2019 1029   NITRITE NEGATIVE 01/17/2019 [place] 01/17/2019 1029    Time coordinating discharge: Over 30 minutes  SIGNED:   Cordelia Poche, MD [facility] 02/10/2019, 12:39 PM

## 2019-02-10 NOTE — Discharge Instructions (Signed)
Scott Mcdowell,  You were in the hospital because of a bad infection, GI bleeding, a blood clot and kidney failure. You now need to have hemodialysis. 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  Information on my medicine - ELIQUIS (apixaban)  This medication education was reviewed with me or my healthcare representative as part of my discharge preparation.    Why was Eliquis prescribed for you? Eliquis was prescribed to treat blood clots that may have been found in the veins of your legs (deep vein thrombosis) or in your lungs (pulmonary embolism) and to reduce the risk of them occurring again.  What do You need to know about Eliquis ? The starting dose is 10 mg (two 5 mg tablets) taken TWICE daily for through 02/09/19, then on 02/10/19  the dose is reduced to ONE 5 mg tablet taken TWICE daily.  Eliquis may be taken with or without food.   Try to take the dose about the same time in the morning and in the evening. If you have difficulty swallowing the tablet whole please discuss with your pharmacist how to take the medication safely.  Take Eliquis exactly as prescribed and DO NOT stop taking Eliquis without talking to the doctor who prescribed the medication.  Stopping may increase your risk of developing a new blood clot.  Refill your prescription before you run out.  After discharge, you should have regular check-up appointments with your healthcare provider that is prescribing your Eliquis.    What do you do if you miss a dose? If a dose of ELIQUIS is not taken at the scheduled time, take it as soon as possible on the same day and twice-daily administration should be resumed. The dose should not be doubled to make up for a missed dose.  Important Safety Information A possible side effect of Eliquis is bleeding. You should call your healthcare provider right away if you experience any of the following: ? Bleeding from an  injury or your nose that does not stop. ? Unusual colored urine (red or dark brown) or unusual colored stools (red or black). ? Unusual bruising for unknown reasons. ? A serious fall or if you hit your head (even if there is no bleeding).  Some medicines may interact with Eliquis and might increase your risk of bleeding or clotting while on Eliquis. To help avoid this, consult your healthcare provider or pharmacist prior to using any new prescription or non-prescription medications, including herbals, vitamins, non-steroidal anti-inflammatory drugs (NSAIDs) and supplements.  This website has more information on Eliquis (apixaban): http://www.eliquis.com/eliquis/home

## 2019-02-10 NOTE — Care Management Important Message (Signed)
Important Message  Patient Details  Name: Scott Mcdowell MRN: [id number] Date of Birth: 1940/12/13   Medicare Important Message Given:  Yes     Zadrian Mccauley 02/10/2019, 1:19 PM

## 2019-02-10 NOTE — Progress Notes (Signed)
Occupational Therapy Treatment Patient Details Name: Scott Mcdowell MRN: [id number] DOB: 1941-01-20 Today's Date: 02/10/2019    History of present illness 78 yo male admitted with ARF, scrotal cellulitis, Pna. Pt extubated 01/22/19.  Hx of CVA with R hemiparesis   OT comments  Pt progressing slowly towards OT goals. Pt tolerated session well with no complaints of pain. Pt required Mod A for trunk elevation for bed mobility to sit at EOB. Mod A +2 to stand with RW, RUE support required and education to bear weight for RUE on RW and EOB for increased proprioceptive input. Pt observed to be fatigued and completed oral at bed level with set up. DC and freq remains the same. OT will continue to follow acutely.  Follow Up Recommendations  CIR    Equipment Recommendations  None recommended by OT    Recommendations for Other Services      Precautions / Restrictions Precautions Precautions: Fall Precaution Comments: R hemiparesis, h/o 1 fall in past 1 year Restrictions Weight Bearing Restrictions: No       Mobility Bed Mobility Overal bed mobility: Needs Assistance Bed Mobility: Supine to Sit Rolling: Mod assist   Supine to sit: Mod assist Sit to supine: Mod assist   General bed mobility comments: mod A for trunk and LEs, pt instructed to bear weight on RUE for proprioceptive input.   Transfers Overall transfer level: Needs assistance Equipment used: Rolling walker (2 wheeled) Transfers: Sit to/from [facility] Sit to Stand: Mod assist;+2 physical assistance;+2 safety/equipment;From elevated surface Stand pivot transfers: +2 physical assistance;Min assist       General transfer comment: pt performed sit <> stand from elevated bed with mod A+2 with support of RLE and RUE to power up to stand with RW.     Balance Overall balance assessment: Needs assistance Sitting-balance support: No upper extremity supported;Feet supported Sitting balance-Leahy Scale:  Fair Sitting balance - Comments: pt able to sit edge of bed and scoot edge of bed with min facilitaiton for hand placement and wt shifts   Standing balance support: Bilateral upper extremity supported Standing balance-Leahy Scale: Poor Standing balance comment: pt required modA+2 to maintain upright posture with support of RUE on RW to bear weight.                           ADL either performed or assessed with clinical judgement   ADL Overall ADL's : Needs assistance/impaired Eating/Feeding: Set up;Sitting Eating/Feeding Details (indicate cue type and reason): pt required assistance with removing lids Grooming: Set up;Oral care;Bed level Grooming Details (indicate cue type and reason): set up required for oral care due to decreased bilateral coordination and decreased function of R hand.  Upper Body Bathing: Minimal assistance;Sitting Upper Body Bathing Details (indicate cue type and reason): minA to wash LUE;limited functional use of RUE Lower Body Bathing: (pt 10%)   Upper Body Dressing : Moderate assistance;Maximal assistance   Lower Body Dressing: Total assistance;+2 for physical assistance;Sit to/from stand   Toilet Transfer: Moderate assistance;+2 for physical assistance;Stand-pivot;Squat-pivot;Cueing for safety;Cueing for sequencing;RW Toilet Transfer Details (indicate cue type and reason): pt required assist with RUE during simulated toilet transfer from bed to standing due to limited function and hypotonia Toileting- Clothing Manipulation and Hygiene: Maximal assistance;Moderate assistance Toileting - Clothing Manipulation Details (indicate cue type and reason): pt rolled R<>L for pericare     Functional mobility during ADLs: Moderate assistance;+2 for physical assistance General ADL Comments: pt completed ADL  in bed level due to exertion completed while standing with OT/PT     Vision Patient Visual Report: No change from baseline     Perception     Praxis       Cognition Arousal/Alertness: Awake/alert Behavior During Therapy: WFL for tasks assessed/performed Overall Cognitive Status: Within Functional Limits for tasks assessed                                          Exercises Exercises: General Upper Extremity   Shoulder Instructions       General Comments pitting edema noted during this session of +1. Hypotonia observed of RUE.     Pertinent Vitals/ Pain          Home Living Family/patient expects to be discharged to:: Unsure Living Arrangements: Spouse/significant other Available Help at Discharge: Family;Available 24 hours/day Type of Home: House Home Access: Stairs to enter [employer] of Steps: 3 (uses door/frame) to hold on to Entrance Stairs-Rails: None Home Layout: Two level;Able to live on main level with bedroom/bathroom     Bathroom Shower/Tub: Tub/shower unit;Curtain   Bathroom Toilet: Standard Bathroom Accessibility: Yes How Accessible: Accessible via walker Home Equipment: Moran - 2 wheels;Kasandra Knudsen - single point      Lives With: Spouse    Prior Functioning/Environment Level of Independence: Independent with assistive device(s)        Comments: uses RW vs cane PRN. "On a good day" pt can ambulate short distances unassisted.   Was able to write and use R hand functionally   Frequency  Min 2X/week        Progress Toward Goals  OT Goals(current goals can now be found in the care plan section)  Progress towards OT goals: Progressing toward goals  Acute Rehab OT Goals Patient Stated Goal: to regain PLOF OT Goal Formulation: With patient Time For Goal Achievement: 02/10/19 Potential to Achieve Goals: Good ADL Goals Pt Will Perform Grooming: with min assist;sitting Pt Will Transfer to Toilet: with min assist;stand pivot transfer;bedside commode Pt Will Perform Toileting - Clothing Manipulation and hygiene: with min guard assist;sit to/from stand Additional ADL Goal #1:  pt will stand and stabilize with AD with min A for adls x 2 minutes Additional ADL Goal #2: pt will incorporate RUE as assist without cues at least one time per session Additional ADL Goal #3: pt will be independent with RUE strengthening program  Plan Discharge plan remains appropriate    Co-evaluation    PT/OT/SLP Co-Evaluation/Treatment: Yes Reason for Co-Treatment: Complexity of the patient's impairments (multi-system involvement) PT goals addressed during session: Strengthening/ROM;Proper use of DME;Balance;Mobility/safety with mobility OT goals addressed during session: ADL's and self-care      AM-PAC OT "6 Clicks" Daily Activity     Outcome Measure   Help from another person eating meals?: A Little Help from another person taking care of personal grooming?: A Lot Help from another person toileting, which includes using toliet, bedpan, or urinal?: A Lot Help from another person bathing (including washing, rinsing, drying)?: A Lot Help from another person to put on and taking off regular upper body clothing?: A Lot Help from another person to put on and taking off regular lower body clothing?: Total 6 Click Score: 12    End of Session Equipment Utilized During Treatment: Gait belt  OT Visit Diagnosis: Unsteadiness on feet (R26.81);Muscle weakness (generalized) (M62.81);Other abnormalities of gait and  mobility (R26.89);History of falling (Z91.81);Repeated falls (R29.6)   Activity Tolerance Patient tolerated treatment well   Patient Left with call bell/phone within reach;in bed;with bed alarm set   Nurse Communication Mobility status        Time: 1444-5848 OT Time Calculation (min): 20 min  Charges: OT General Charges $OT Visit: 1 Visit OT Treatments $Self Care/Home Management : 8-22 mins  Minus Breeding, MSOT, OTR/L  Supplemental Rehabilitation Services  [phone]    Marius Ditch 02/10/2019, 3:55 PM

## 2019-02-10 NOTE — Progress Notes (Signed)
Patient arrived to the unit at 1635 today. He is alert and oriented x 4. No complaints of pain & in no apparent distress.

## 2019-02-10 NOTE — Progress Notes (Addendum)
Inpatient Rehabilitation Admissions Coordinator  I contacted Dr. Lonny Prude to clarify if pt medically ready to d/c to CIR today. I await final determination.   I spoke with Dr. Lonny Prude and we will arrange admit to CIR today. Otila Kluver in hemodialysis made aware as well as RN CM , Ellard Artis and Therapist, sports. I will make the arrangements to admit today.   Danne Baxter, RN, MSN Rehab Admissions Coordinator [phone] 02/10/2019 11:27 AM

## 2019-02-11 ENCOUNTER — Inpatient Hospital Stay (HOSPITAL_COMMUNITY): Payer: Medicare Other

## 2019-02-11 ENCOUNTER — Inpatient Hospital Stay (HOSPITAL_COMMUNITY): Payer: Medicare Other | Admitting: Occupational Therapy

## 2019-02-11 DIAGNOSIS — I6930 Unspecified sequelae of cerebral infarction: Secondary | ICD-10-CM

## 2019-02-11 DIAGNOSIS — I951 Orthostatic hypotension: Secondary | ICD-10-CM

## 2019-02-11 DIAGNOSIS — D7589 Other specified diseases of blood and blood-forming organs: Secondary | ICD-10-CM

## 2019-02-11 DIAGNOSIS — C9000 Multiple myeloma not having achieved remission: Secondary | ICD-10-CM

## 2019-02-11 DIAGNOSIS — Z8719 Personal history of other diseases of the digestive system: Secondary | ICD-10-CM

## 2019-02-11 DIAGNOSIS — N058 Unspecified nephritic syndrome with other morphologic changes: Secondary | ICD-10-CM

## 2019-02-11 DIAGNOSIS — Z992 Dependence on renal dialysis: Secondary | ICD-10-CM

## 2019-02-11 DIAGNOSIS — N186 End stage renal disease: Secondary | ICD-10-CM

## 2019-02-11 DIAGNOSIS — R5381 Other malaise: Principal | ICD-10-CM

## 2019-02-11 LAB — GLUCOSE, CAPILLARY
Glucose-Capillary: 109 mg/dL — ABNORMAL HIGH (ref 70–99)
Glucose-Capillary: 136 mg/dL — ABNORMAL HIGH (ref 70–99)

## 2019-02-11 LAB — CBC
HCT: 24.1 % — ABNORMAL LOW (ref 39.0–52.0)
Hemoglobin: 7.4 g/dL — ABNORMAL LOW (ref 13.0–17.0)
MCH: 32 pg (ref 26.0–34.0)
MCHC: 30.7 g/dL (ref 30.0–36.0)
MCV: 104.3 fL — ABNORMAL HIGH (ref 80.0–100.0)
Platelets: 178 10*3/uL (ref 150–400)
RBC: 2.31 MIL/uL — ABNORMAL LOW (ref 4.22–5.81)
RDW: 19.4 % — ABNORMAL HIGH (ref 11.5–15.5)
WBC: 6.2 10*3/uL (ref 4.0–10.5)
nRBC: 0 % (ref 0.0–0.2)

## 2019-02-11 LAB — RENAL FUNCTION PANEL
Albumin: 3.4 g/dL — ABNORMAL LOW (ref 3.5–5.0)
Anion gap: 13 (ref 5–15)
BUN: 60 mg/dL — ABNORMAL HIGH (ref 8–23)
CO2: 26 mmol/L (ref 22–32)
Calcium: 9.2 mg/dL (ref 8.9–10.3)
Chloride: 98 mmol/L (ref 98–111)
Creatinine, Ser: 5.93 mg/dL — ABNORMAL HIGH (ref 0.61–1.24)
GFR calc Af Amer: 10 mL/min — ABNORMAL LOW (ref >60)
GFR calc non Af Amer: 8 mL/min — ABNORMAL LOW (ref >60)
Glucose, Bld: 125 mg/dL — ABNORMAL HIGH (ref 70–99)
Phosphorus: 4.4 mg/dL (ref 2.5–4.6)
Potassium: 4.5 mmol/L (ref 3.5–5.1)
Sodium: 137 mmol/L (ref 135–145)

## 2019-02-11 MED ORDER — ALTEPLASE 2 MG IJ SOLR
2.0000 mg | Freq: Once | INTRAMUSCULAR | Status: DC | PRN
  Filled 2019-02-11: qty 2

## 2019-02-11 MED ORDER — MIDODRINE HCL 5 MG PO TABS
ORAL_TABLET | ORAL | Status: AC
  Administered 2019-02-11: 10 mg via ORAL
  Filled 2019-02-11: qty 2

## 2019-02-11 MED ORDER — ZINC OXIDE 40 % EX OINT
TOPICAL_OINTMENT | Freq: Every day | CUTANEOUS | Status: DC
  Administered 2019-02-11 – 2019-02-26 (×58): via TOPICAL
  Administered 2019-02-26: 1 via TOPICAL
  Administered 2019-02-27 – 2019-03-05 (×27): via TOPICAL
  Filled 2019-02-11 (×3): qty 57

## 2019-02-11 MED ORDER — PANTOPRAZOLE SODIUM 40 MG PO TBEC
40.0000 mg | DELAYED_RELEASE_TABLET | Freq: Two times a day (BID) | ORAL | Status: DC
  Administered 2019-02-11 – 2019-03-05 (×45): 40 mg via ORAL
  Filled 2019-02-11 (×45): qty 1

## 2019-02-11 MED ORDER — PENTAFLUOROPROP-TETRAFLUOROETH EX AERO: 1.0000 "application " | INHALATION_SPRAY | CUTANEOUS | Status: DC | PRN

## 2019-02-11 MED ORDER — SODIUM CHLORIDE 0.9 % IV SOLN: 100.0000 mL | INTRAVENOUS | Status: DC | PRN

## 2019-02-11 MED ORDER — LIDOCAINE HCL (PF) 1 % IJ SOLN: 5.0000 mL | INTRAMUSCULAR | Status: DC | PRN

## 2019-02-11 MED ORDER — ALBUMIN HUMAN 25 % IV SOLN
INTRAVENOUS | Status: AC
  Administered 2019-02-11: 25 g
  Filled 2019-02-11: qty 50

## 2019-02-11 MED ORDER — MIDODRINE HCL 5 MG PO TABS
10.0000 mg | ORAL_TABLET | ORAL | Status: AC
  Administered 2019-02-11: 15:00:00 10 mg via ORAL

## 2019-02-11 MED ORDER — DEXAMETHASONE 4 MG PO TABS
16.0000 mg | ORAL_TABLET | Freq: Two times a day (BID) | ORAL | Status: AC
  Administered 2019-02-11 – 2019-02-14 (×6): 16 mg via ORAL
  Filled 2019-02-11: qty 8
  Filled 2019-02-11: qty 4
  Filled 2019-02-11 (×4): qty 8
  Filled 2019-02-11: qty 4
  Filled 2019-02-11: qty 8

## 2019-02-11 MED ORDER — HEPARIN SODIUM (PORCINE) 1000 UNIT/ML DIALYSIS
1000.0000 [IU] | INTRAMUSCULAR | Status: DC | PRN
  Filled 2019-02-11: qty 1

## 2019-02-11 MED ORDER — LIDOCAINE-PRILOCAINE 2.5-2.5 % EX CREA
1.0000 "application " | TOPICAL_CREAM | CUTANEOUS | Status: DC | PRN
  Filled 2019-02-11: qty 5

## 2019-02-11 NOTE — Progress Notes (Signed)
[facility] PHYSICAL MEDICINE & REHABILITATION PROGRESS NOTE  Subjective/Complaints: Patient seen laying in bed this morning.  He states he slept well overnight.  He states he is ready begin therapies today.  ROS: Denies CP, shortness of breath, nausea, vomiting, diarrhea.  Objective: Vital Signs: Blood pressure 99/77, pulse 77, temperature 98.5 F (36.9 C), temperature source Oral, resp. rate 18, height 6\' 2"  (1.88 m), weight 105.8 kg, SpO2 96 %. No results found. Recent Labs    02/09/19 0601 02/10/19 0542  WBC 7.7 6.6  HGB 7.6* 7.7*  HCT 23.3* 24.2*  PLT PLATELET CLUMPS NOTED ON SMEAR, UNABLE TO ESTIMATE 185   Recent Labs    02/09/19 0601 02/10/19 0542  NA 136 136  K 5.2* 4.3  CL 100 97*  CO2 23 26  GLUCOSE 100* 109*  BUN 77* 40*  CREATININE 6.82* 4.28*  CALCIUM 8.9 9.1    Physical Exam: BP 99/77 (BP Location: Left Leg)   Pulse 77   Temp 98.5 F (36.9 C) (Oral)   Resp 18   Ht 6\' 2"  (1.88 m)   Wt 105.8 kg   SpO2 96%   BMI 29.95 kg/m  Constitutional: No distress . Vital signs reviewed.  Obese. HENT: Normocephalic.  Atraumatic. Eyes: EOMI. No discharge. Cardiovascular: No JVD. Respiratory: Normal effort. GI: Non-distended. Musc: Generalized edema, improving Neurological: Alert Follows commands Motor: Left upper extremity: 5/5 proximal distal Left lower extremity: 4-4+/5 proximal distal Right upper extremity: 3-/5 proximal distal Right lower extremity: 2+-2-/5 proximal to distal  Skin: Skin is warm and dry.  Sacral ulcer not examined  Psychiatric: He has a normal mood and affect. His behavior is normal  Assessment/Plan: 1. Functional deficits secondary to debility which require 3+ hours per day of interdisciplinary therapy in a comprehensive inpatient rehab setting.  Physiatrist is providing close team supervision and 24 hour management of active medical problems listed below.  Physiatrist and rehab team continue to assess barriers to  discharge/monitor patient progress toward functional and medical goals  Care Tool:  Bathing              Bathing assist       Upper Body Dressing/Undressing Upper body dressing        Upper body assist      Lower Body Dressing/Undressing Lower body dressing    Lower body dressing activity did not occur: Refused       Lower body assist       Toileting Toileting    Toileting assist Assist for toileting: Maximal Assistance - Patient 25 - 49%     Transfers Chair/bed transfer  Transfers assist           Locomotion Ambulation   Ambulation assist              Walk 10 feet activity   Assist           Walk 50 feet activity   Assist           Walk 150 feet activity   Assist           Walk 10 feet on uneven surface  activity   Assist           Wheelchair     Assist               Wheelchair 50 feet with 2 turns activity    Assist            Wheelchair 150 feet activity  Assist            Medical Problem List and Plan: 1.  Debility secondary to acute renal failure/ascites/multi-medical  Begin CIR evaluations 2.  Antithrombotics:   Right brachiocephalic DVT 5/0/5697: Eliquis             -antiplatelet therapy: N/A 3. Pain Management: Hydrocodone as needed 4. Mood: Provide emotional support             -antipsychotic agents: N/A 5. Neuropsych: This patient is capable of making decisions on his own behalf. 6. Skin/Wound Care/multiple decubiti sacrum/heels.:  Prevalon boots and follow-up wound care nurse routine skin checks 7. Fluids/Electrolytes/Nutrition: Routine in and outs.   8.  GI bleed.  EGD 01/21/2019.  Follow-up gastroenterology recommendations.    Hemoglobin 7.7 on 6/24  Continue to monitor 9.  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  Status post left brachiocephalic AV fistula.  Hemodialysis Tuesday Thursday Saturday.  Recommendation per nephro. 10.  Plasma cell myeloma.   Outpatient follow-up with oncology to discuss formal chemotherapy.  Appreciate oncology recommendations  Per oncology, order repeat dose of Decadron 11.  Right pleural effusion.  Status post thoracentesis 01/16/2019. 12.  Orthostasis.  ProAmatine 10 mg 3 times daily.    Monitor with increased mobility 13.  Episodic SVT.  8 beats noted 02/08/2019.  Amiodarone as directed.    Monitor with increased mobility 13.  History of CVA with recurrent right hemiparesis.  LOS: 1 days A FACE TO FACE EVALUATION WAS PERFORMED  Takerra Lupinacci Lorie Phenix 02/11/2019, 9:42 AM

## 2019-02-11 NOTE — Consult Note (Signed)
[facility] Nurse wound consult note Patient receiving care in [facility] [phone]. Assisted with turning for assessment by his primary LPN Reason for Consult: sacral and heel care guidance Wound type: no wounds found on feet/heels.  MASD from fecal incontinence and peeling of superficial tissues from movement are present to bilateral buttocks. Dressing procedure/placement/frequency: 5 x daily application of 25% Desitin after cleansing for buttocks; keep socks off to increase compliance with use of Prevalon heel lift boots; and a loss air loss mattress.  Patient was also encouraged to shift hips and move to relieve pressure.  Thank you for the consult.  Discussed plan of care with the patient and bedside nurse.  [place] nurse will not follow at this time.  Please re-consult the [place] team if needed.  Val Riles, RN, MSN, CWOCN, CNS-BC, pager [phone]

## 2019-02-11 NOTE — Progress Notes (Signed)
Scott Mcdowell is now in the rehab unit.  Hopefully, he will do well and improve his overall performance status.  He is on hemodialysis.  He had it 2 days ago.  His BUN and creatinine have improved quite nicely.  There are no labs back yet for today.  Yesterday, his BUN was 40 and creatinine 4.3.  As far as the light chain myeloma is concerned, he got his Decadron.  We may consider another course of Decadron.  We can certainly do it orally.  As far as formal therapy for his light chain myeloma, we can probably do this as an outpatient.  He lives down in [place].  Once he is discharged, I will speak to the oncologist down in [place] so they can follow-up.  Of note, the cytogenetics on the bone marrow that he had are normal.  I will know if any FISH panel was sent off.  I would have to believe that there is some chromosome abnormality with his light chain disease.  It would be nice to try to do another 24-hour urine on him.  Last one was done about a month ago.  I will see about doing a serum light chain analysis.  His appetite is marginal.  There is no bleeding.  He is on blood thinner for the thrombus in his right arm.  The right arm is still swollen but not as tight.  He has had no fever.  He has had no diarrhea.  All of his vital signs are stable.  Temperature 98.5.  Pulse 77.  Blood pressure 99/77.  Oral exam shows no thrush.  Neck is supple with no adenopathy.  Lungs are clear bilaterally.  Cardiac exam regular rate and rhythm with no murmurs, rubs or bruits.  Abdomen is soft.  Bowel sounds are present.  There is no guarding or rebound tenderness.  Extremities shows some edema in his lower legs.  Neurological exam is nonfocal.  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  This is the etiology for his renal failure.  He will not improve even with chemotherapy.  Our goal really is to try to prevent other issues from happening as the myeloma progresses.  Lattie Haw, MD  Jeneen Rinks  4:10

## 2019-02-11 NOTE — Consult Note (Signed)
I have placed a request via Secure Chat to Dr. Delice Lesch  requesting photos of the wound areas of concern to be placed in the EMR.    Val Riles, RN, MSN, CWOCN, CNS-BC, pager [phone]

## 2019-02-11 NOTE — Progress Notes (Signed)
Inpatient Rehabilitation  Patient information reviewed and entered into eRehab system by Hervey Wedig M. Jorgen Wolfinger, M.A., CCC/SLP, PPS Coordinator.  Information including medical coding, functional ability and quality indicators will be reviewed and updated through discharge.    

## 2019-02-11 NOTE — Evaluation (Signed)
Occupational Therapy Assessment and Plan  Patient Details  Name: Scott Mcdowell MRN: [id number] Date of Birth: 1940-11-30  OT Diagnosis: abnormal posture, hemiplegia affecting dominant side, muscular wasting and disuse atrophy, muscle weakness (generalized) and swelling of limb Rehab Potential: Rehab Potential (ACUTE ONLY): Good ELOS: 18-21 days   Today's Date: 02/11/2019 OT Individual Time: 8032-1224 OT Individual Time Calculation (min): 75 min     Problem List:  Patient Active Problem List   Diagnosis Date Noted  . Debility 02/10/2019  . AKI (acute kidney injury) ([place])   . CKD (chronic kidney disease), stage IV ([place])   . 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 Pleural effusion   . History of CVA with residual deficit   . SVT (supraventricular tachycardia) ([place])   . Orthostasis   . ESRD (end stage renal disease) ([place])   . Gastrointestinal hemorrhage   . Pressure injury of skin 01/30/2019  . Goals of care, counseling/discussion 01/29/2019  . Lambda light chain myeloma ([place]) 01/29/2019  . Shock ([place]) 01/21/2019  . Acute blood loss anemia 01/21/2019  . Upper GI bleed 01/21/2019  . B12 deficiency anemia 01/15/2019  . Pleural effusion on right 01/15/2019  . Ascites 01/15/2019  . CAP (community acquired pneumonia) 01/15/2019  . Elevated troponin 01/15/2019  . Hypercalcemia 01/14/2019  . ARF (acute renal failure) ([place]) 01/14/2019  . Hyperkalemia 01/14/2019    Past Medical History:  Past Medical History:  Diagnosis Date  . Goals of care, counseling/discussion 01/29/2019  . HTN (hypertension)   . Lambda light chain myeloma ([place]) 01/29/2019  . Stroke [facility]) 2004   w right sided weakness   Past Surgical History:  Past Surgical History:  Procedure Laterality Date  . AV FISTULA PLACEMENT Left 02/05/2019   Procedure: ARTERIOVENOUS FISTULA CREATION LEFT ARM;  Surgeon: Elam Dutch, MD;  Location[facility];  Service: Vascular;  Laterality: Left;  .  ESOPHAGOGASTRODUODENOSCOPY N/A 01/21/2019   Procedure: ESOPHAGOGASTRODUODENOSCOPY (EGD);  Surgeon: Yetta Flock, MD;  Location: [facility] ENDOSCOPY;  Service: Gastroenterology;  Laterality: N/A;  at bedside. PCCM intubating patient and he will be ready for EGD by 10: 30-10:40am  . ESOPHAGOGASTRODUODENOSCOPY (EGD) WITH PROPOFOL N/A 01/31/2019   Procedure: ESOPHAGOGASTRODUODENOSCOPY (EGD) WITH PROPOFOL;  Surgeon: Juanita Craver, MD;  Location: [facility] ENDOSCOPY;  Service: Endoscopy;  Laterality: N/A;  . HEMOSTASIS CLIP PLACEMENT  01/21/2019   Procedure: HEMOSTASIS CLIP PLACEMENT;  Surgeon: Yetta Flock, MD;  Location: [facility] ENDOSCOPY;  Service: Gastroenterology;;  . HEMOSTASIS CONTROL  01/21/2019   Procedure: HEMOSTASIS CONTROL;  Surgeon: Yetta Flock, MD;  Location: [facility] ENDOSCOPY;  Service: Gastroenterology;;  Girtha Rm Probe  . INSERTION OF DIALYSIS CATHETER Right 02/05/2019   Procedure: INSERTION OF DIALYSIS CATHETER RIGHT INTERNAL JUGULAR;  Surgeon: Elam Dutch, MD;  Location: [facility];  Service: Vascular;  Laterality: Right;  . IR FLUORO GUIDE CV LINE RIGHT  01/20/2019  . IR US GUIDE VASC ACCESS RIGHT  01/20/2019  . SCLEROTHERAPY  01/21/2019   Procedure: SCLEROTHERAPY;  Surgeon: Yetta Flock, MD;  Location: [facility] ENDOS[facility];  Service: Gastroenterology;;    Assessment & Plan Clinical Impression: Patient is a 78 y.o. right-handed male with history of hypertension, CVA with residual right side weakness maintained on aspirin, morbid obesity.  Per chart review and patient, patient lives with his spouse.  Independent with assistive device prior to admission.  Presented to [facility] ED 01/15/2019 with shortness of breath and scrotal edema.  Labs were obtained.  Sodium 137, potassium  5.6, BUN 80, creatinine 2.5.  Troponin  0.07.  COVID negative.  CT chest abdomen pelvis showed moderate right pleural effusion associated with atelectasis versus consolidation.  Mild left basilar scarring.  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  The right common iliac measuring 2.3 cm in caliber left common iliac measuring 2.3.  Testicular ultrasound no evidence of testicular mass.  Patient was transferred to [facility] for further evaluation.  He was noted to have a pleural effusion and had thoracentesis on 01/16/2019 with drainage of approximately 600 cc. He did require intubation through 01/22/2019.  Maintained on broad-spectrum antibiotics for suspected pneumonia.  Hospital course complicated by AKI with follow-up per renal services circulatory shock requiring vasopressors.  On 01/19/2019 he was found to have right brachial vein DVT and started on heparin.  He subsequently developed GI bleed and underwent EGD on 01/21/2019, which showed adherent clot friable mucosa distal gastric body area was clipped per Dr. Havery Moros.  His anticoagulation was initially discontinued due to high risk of bleeding.  To date patient received 6 units of packed red blood cells, 1 unit of platelets and 3 units of fresh frozen plasma.  Hemoglobin stabilized patient was transitioned to Eliquis for DVT and latest hemoglobin 7.6.  Patient was found to have a lytic rib lesion concerning for possible malignancy.  A multiple myeloma panel and bone marrow biopsy were performed with follow-up per oncology services Dr. Marin Olp.  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  Plan would be follow-up outpatient with oncology services.  Patient's creatinine 6.50-4.61 and hemodialysis has been initiated plan long-term hemodialysis Tuesday Thursday Saturday schedule.  Patient with right IJ tunneled catheter and left brachiocephalic AV fistula has been placed.  Patient with episodic SVT 02/08/2019 initially on metoprolol discontinued started on amiodarone loaded 200 mg  twice daily changed to 100 mg twice daily 02/10/2019.  Patient with reportedly multi-decubiti sacrum and heels. Prevalon boots in place and frequent turning to maintain skin integrity.  Therapy evaluations completed and patient was admitted for a comprehensive rehab program.  Patient transferred to CIR on 02/10/2019 .    Patient currently requires max assist +2 with basic self-care skills secondary to muscle weakness, decreased cardiorespiratoy endurance, unbalanced muscle activation and decreased coordination and decreased standing balance, hemiplegia and decreased balance strategies.  Prior to hospitalization, patient could complete ADLs with modified independent , except Min assist for bathing.  Patient will benefit from skilled intervention to decrease level of assist with basic self-care skills prior to discharge home with care partner.  Anticipate patient will require intermittent supervision and minimal physical assistance and follow up home health.  OT - End of Session Activity Tolerance: Tolerates 30+ min activity with multiple rests Endurance Deficit: Yes Endurance Deficit Description: required frequent rest breaks OT Assessment Rehab Potential (ACUTE ONLY): Good OT Patient demonstrates impairments in the following area(s): Balance;Edema;Endurance;Motor;Pain;Safety OT Basic ADL's Functional Problem(s): Eating;Grooming;Bathing;Dressing;Toileting OT Advanced ADL's Functional Problem(s): Simple Meal Preparation OT Transfers Functional Problem(s): Toilet;Tub/Shower OT Additional Impairment(s): Fuctional Use of Upper Extremity OT Plan OT Intensity: Minimum of 1-2 x/day, 45 to 90 minutes OT Frequency: 5 out of 7 days OT Duration/Estimated Length of Stay: 18-21 days OT Treatment/Interventions: Medical illustrator training;Community reintegration;Discharge planning;Disease mangement/prevention;DME/adaptive equipment instruction;Functional mobility training;Neuromuscular re-education;Pain  management;Patient/family education;Psychosocial support;Self Care/advanced ADL retraining;Skin care/wound managment;Therapeutic Activities;Therapeutic Exercise;UE/LE Strength taining/ROM;UE/LE Coordination  activities OT Self Feeding Anticipated Outcome(s): Supervision/setup OT Basic Self-Care Anticipated Outcome(s): Min assist OT Toileting Anticipated Outcome(s): Min assist OT Bathroom Transfers Anticipated Outcome(s): Min assist OT Recommendation Patient destination: Home Follow Up Recommendations: Home health OT;24 hour supervision/assistance Equipment Recommended: 3 in 1 bedside comode;Tub/shower bench   Skilled Therapeutic Intervention OT eval completed with discussion of rehab process, OT purpose, POC, ELOS, and goals.  ADL assessment completed with bathing and dressing from EOB.  Pt required mod assist for bed mobility to come to sitting at EOB.  Engaged in bathing and dressing with min assist for UB bathing and max assist for LB bathing, ultimately requiring +2 for sit > stand when washing buttocks.  Pt unable to thread BLE through pant legs despite increased time, requiring max assist +2 for sit > stand when pulling pants over hips.  Pt reports having increased functional use of RUE PTA, but now with minimal shoulder activation to incorporate into functional tasks.  Returned to semi-reclined in bed and left with RUE elevated on pillows for edema management and improved positioning.    OT Evaluation Precautions/Restrictions  Precautions Precautions: Fall Precaution Comments: R hemiparesis, h/o 1 fall in past 1 year Pain Pain Assessment Pain Scale: 0-10 Pain Score: 0-No pain Home Living/Prior Functioning Home Living Family/patient expects to be discharged to:: Private residence Living Arrangements: Spouse/significant other Available Help at Discharge: Family, Available 24 hours/day Type of Home: House Home Access: Stairs to enter [employer] of Steps: 3 (uses  door/frame) to hold on to Entrance Stairs-Rails: None Home Layout: Two level, Able to live on main level with bedroom/bathroom, 1/2 bath on main level(shower is upstairs) Bathroom Shower/Tub: Tub/shower unit, Architectural technologist: Standard(reports daughter installing handicap height toilet)  Lives With: Spouse IADL History Homemaking Responsibilities: Yes Meal Prep Responsibility: Building surveyor Responsibility: No Shopping Responsibility: Primary Current License: No Prior Function Level of Independence: Independent with transfers, Needs assistance with ADLs(occasional use of RW or cane) Bath: Minimal  Able to Take Stairs?: Yes Driving: No Comments: uses RW vs cane PRN. "On a good day" pt can ambulate short distances unassisted.   Was able to write and use R hand functionally ADL ADL Grooming: Minimal assistance Where Assessed-Grooming: Edge of bed Upper Body Bathing: Minimal assistance Where Assessed-Upper Body Bathing: Edge of bed Lower Body Bathing: Maximal assistance Where Assessed-Lower Body Bathing: Edge of bed Upper Body Dressing: Moderate assistance Where Assessed-Upper Body Dressing: Edge of bed Lower Body Dressing: Dependent(+2) Where Assessed-Lower Body Dressing: Edge of bed Toilet Transfer: Maximal assistance(+2) Toilet Transfer Method: Charlaine Dalton) Toilet Transfer Equipment: Bedside commode Vision Baseline Vision/History: No visual deficits Patient Visual Report: No change from baseline Vision Assessment?: No apparent visual deficits Cognition Overall Cognitive Status: Within Functional Limits for tasks assessed Arousal/Alertness: Awake/alert Orientation Level: Person;Place;Situation Person: Oriented Place: Oriented Situation: Oriented Year: 2020 Month: June Day of Week: Correct Memory: Appears intact Immediate Memory Recall: Sock;Blue;Bed Memory Recall: Sock;Bed;Blue Memory Recall Sock: Without Cue Memory Recall Blue: Without Cue Memory Recall Bed:  Without Cue Attention: Sustained;Selective Sustained Attention: Appears intact Selective Attention: Appears intact Awareness: Appears intact Problem Solving: Impaired Safety/Judgment: Appears intact Sensation Sensation Light Touch: Appears Intact Proprioception: Appears Intact Coordination Gross Motor Movements are Fluid and Coordinated: No Coordination and Movement Description: R hemi Finger Nose Finger Test: unable RUE Heel Shin Test: unable RLE Motor  Motor Motor: Hemiplegia;Abnormal tone Motor - Skilled Clinical Observations: hypotonic Mobility  Bed Mobility Bed Mobility: Rolling Right;Rolling Left;Left Sidelying to Sit Rolling Right: Supervision/verbal cueing Rolling Left:  Moderate Assistance - Patient 50-74% Left Sidelying to Sit: Moderate Assistance - Patient 50-74%  Trunk/Postural Assessment  Cervical Assessment Cervical Assessment: Within Functional Limits Thoracic Assessment Thoracic Assessment: Within Functional Limits Lumbar Assessment Lumbar Assessment: Exceptions to WFL(increased wt bearing L) Postural Control Postural Control: Within Functional Limits  Balance   Extremity/Trunk Assessment RUE Assessment RUE Assessment: Exceptions to [facility] Passive Range of Motion (PROM) Comments: Shoulder flexion to 90*, elbow flexion/extension WNL Active Range of Motion (AROM) Comments: shoulder flexion to 20* gravity eliminated, minimal elbow flexion, loose gross grasp General Strength Comments: 1-2/5 LUE Assessment LUE Assessment: Within Functional Limits General Strength Comments: 5/5     Refer to Care Plan for Long Term Goals  Recommendations for other services: None    Discharge Criteria: Patient will be discharged from OT if patient refuses treatment 3 consecutive times without medical reason, if treatment goals not met, if there is a change in medical status, if patient makes no progress towards goals or if patient is discharged from hospital.  The above  assessment, treatment plan, treatment alternatives and goals were discussed and mutually agreed upon: by patient  Simonne Come 02/11/2019, 8:47 AM

## 2019-02-11 NOTE — Progress Notes (Signed)
Physical Therapy Session Note  Patient Details  Name: Scott Mcdowell MRN: [id number] Date of Birth: 08/29/1940  Today's Date: 02/11/2019 PT Individual Time: 1115-1200 PT Individual Time Calculation (min): 45 min   Short Term Goals: Week 1:  PT Short Term Goal 1 (Week 1): pt will move supine> sit wiht min assist PT Short Term Goal 2 (Week 1): pt will move sit> supine with mod/max assist PT Short Term Goal 3 (Week 1): pt will transfer bed>< w/c wiht assistance of 1 person PT Short Term Goal 4 (Week 1): pt will initiate gait training  Skilled Therapeutic Interventions/Progress Updates:   Pt asleep in bed but awakened to name.  Pt very fatigued but willing to try bedside tx.  10 x 1 each in supine: active assistive- L  straight leg raises active- 2 x 10 -cervical flexion, L scapular protraction resisted- L ankle pumps  neuromuscular re-education via positioning; prolonged stretch R trunk, R hip adduction,  R hip rotation with flexed knee and hip-clockwise and counterclockwise, R hip abduction, bil lower trunk rotation with strap holding thighs together, bil hip adductor squeezes, L scapular protraction with assistance for elbow extension  Therapy Documentation Precautions:  Precautions Precautions: Fall Precaution Comments: R hemiparesis, h/o 1 fall in past 1 year; hemiparesis more apparent due to debility Restrictions Weight Bearing Restrictions: No   Pain: pt denies     Therapy/Group: Individual Therapy  [name] 02/11/2019, 12:10 PM

## 2019-02-11 NOTE — Progress Notes (Signed)
Patient educated by [facility] and this nurse regarding use of prevalon boots for bilateral heels. Patient agreed to wear boots and in place at time. MASD noted to bilateral sacral/buttock area.  Woc with recommendations and patient informed and agreed. Adria Devon, LPN

## 2019-02-11 NOTE — Procedures (Signed)
Patient was seen on dialysis and the procedure was supervised.  [id number]  Via TDC BP is 88/50. He is on midodrine for hypotension. Difficult to achieve UF, order another dose of midodrine. Left AVF has good thrill and bruit.    Patient appears to be tolerating treatment well. Discussed with the staffs.    Dron Tanna Furry 02/11/2019

## 2019-02-11 NOTE — Progress Notes (Signed)
Social Work Assessment and Plan   Patient Details  Name: Scott Mcdowell MRN: [id number] Date of Birth: [birth year]  Today's Date: 02/11/2019  Problem List:  Patient Active Problem List   Diagnosis Date Noted  . ESRD on dialysis ([place])   . History of GI bleed   . Debility 02/10/2019  . AKI (acute kidney injury) ([place])   . CKD (chronic kidney disease), stage IV ([place])   . 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 Pleural effusion   . History of CVA with residual deficit   . SVT (supraventricular tachycardia) ([place])   . Orthostasis   . ESRD (end stage renal disease) ([place])   . Gastrointestinal hemorrhage   . Pressure injury of skin 01/30/2019  . Goals of care, counseling/discussion 01/29/2019  . 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 Shock ([place]) 01/21/2019  . Acute blood loss anemia 01/21/2019  . Upper GI bleed 01/21/2019  . B12 deficiency anemia 01/15/2019  . Pleural effusion on right 01/15/2019  . Ascites 01/15/2019  . CAP (community acquired pneumonia) 01/15/2019  . Elevated troponin 01/15/2019  . Hypercalcemia 01/14/2019  . ARF (acute renal failure) ([place]) 01/14/2019  . Hyperkalemia 01/14/2019   Past Medical History:  Past Medical History:  Diagnosis Date  . Goals of care, counseling/discussion 01/29/2019  . HTN (hypertension)   . 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 Stroke [facility]) 2004   w right sided weakness   Past Surgical History:  Past Surgical History:  Procedure Laterality Date  . AV FISTULA PLACEMENT Left 02/05/2019   Procedure: ARTERIOVENOUS FISTULA CREATION LEFT ARM;  Surgeon: Elam Dutch, MD;  Location: [facility] OR;  Service: Vascular;  Laterality: Left;  . ESOPHAGOGASTRODUODENOSCOPY N/A 01/21/2019   Procedure: ESOPHAGOGASTRODUODENOSCOPY (EGD);  Surgeon: Yetta Flock, MD;  Location: [facility] END[facility]COPY;  Service: Gastroenterology;  Laterality: N/A;  at bedside. PCCM intubating patient and he will be ready for EGD by 10:  30-10:40am  . ESOPHAGOGASTRODUODENOSCOPY (EGD) WITH PROPOFOL N/A 01/31/2019   Procedure: ESOPHAGOGASTRODUODENOSCOPY (EGD) WITH PROPOFOL;  Surgeon: Juanita Craver, MD;  Location: [facility] ENDOSCOPY;  Service: Endoscopy;  Laterality: N/A;  . HEMOSTASIS CLIP PLACEMENT  01/21/2019   Procedure: HEMOSTASIS CLIP PLACEMENT;  Surgeon: Yetta Flock, MD;  Location: [facility] ENDOSCOPY;  Service: Gastroenterology;;  . HEMOSTASIS CONTROL  01/21/2019   Procedure: HEMOSTASIS CONTROL;  Surgeon: Yetta Flock, MD;  Location: [facility] ENDOSCOPY;  Service: Gastroenterology;;  G[place]a Rm Probe  . INSERTION OF DIALYSIS CATHETER Right 02/05/2019   Procedure: INSERTION OF DIALYSIS CATHETER RIGHT INTERNAL JUGULAR;  Surgeon: Elam Dutch, MD;  Location: [facility];  Service: Vascular;  Laterality: Right;  . IR FLUORO GUIDE CV LINE RIGHT  01/20/2019  . IR US GUIDE VASC ACCESS RIGHT  01/20/2019  . SCLEROTHERAPY  01/21/2019   Procedure: SCLEROTHERAPY;  Surgeon: Yetta Flock, MD;  Location: [facility];  Service: Gastroenterology;;   Social History:  reports that he has quit smoking. His smoking use included cigarettes. He has never used smokeless tobacco. He reports current alcohol use of about 5.0 standard drinks of alcohol per week. No history on file for drug.  Family / Support Systems Marital Status: Married Patient Roles: Spouse, Parent Spouse/Significant Other: [name] [phone] Children: Mary-daughter [phone] Other Supports: Other children-four all involved Anticipated Caregiver: Wife and children Ability/Limitations of Caregiver: Wife has some health issues-STM/sleep. Tow daughter's not employed and can assist at this time Caregiver Availability: 24/7 Family Dynamics: Close knit family who are involved and  pt reports his children and others will assist with his care at home. He has no doubt he will have plenty of care at home and just needs to tell them what he needs.  Social History Preferred  language: English Religion: None Cultural Background: No issues Education: College Read: Yes Write: Yes Employment Status: Retired Public relations account executive Issues: No issues Guardian/Conservator: none-according to MD pt is capable of making his own decisions while here. He is quite talkative and feels whatever he needs his family will provide   Abuse/Neglect Abuse/Neglect Assessment Can Be Completed: Yes Physical Abuse: Denies Verbal Abuse: Denies Sexual Abuse: Denies Exploitation of patient/patient's resources: Denies Self-Neglect: Denies  Emotional Status Pt's affect, behavior and adjustment status: Pt can explain his debility and weakness on his righ side as a result of his old CVA. He voiced he had completely recovered from his stroke but now is having weakness on this side. He wants to be able to take care of himself and not rely upon his family members. Recent Psychosocial Issues: other health issues-mutliple see's many MD's but had still managed to be mod/i prior to admission Psychiatric History: No history-deferred depression screen due to seems to be coping appropriately and can verbalize his concerns and feelings. He may benefit from seeing neuro-psych while here due to multiple health issues and for his coping with them. Substance Abuse History: No issues  Patient / Family Perceptions, Expectations & Goals Pt/Family understanding of illness & functional limitations: Pt does talk with his MD's daily and seems to have a good understanding of the multitude of his health issues. He plans to work hard here and hopes he makes good progress. He is not one to shy away and asks many questions of the MD's/ Premorbid pt/family roles/activities: Husband, father, grandfather, retiree, business Ecologist, dialysis pt, church member, etc Anticipated changes in roles/activities/participation: resume Pt/family expectations/goals: Pt states: " I want to be able to do as much as I can for myself  before I go home. I would like to be mobile again." Son states: " We will do whatever he needs, he is our father."  [employer]: Other (Comment)(HD pt T, TH & Sat) Premorbid Home Care/DME Agencies: Other (Comment)(cane and rw) Transportation available at discharge: Children and extneded family Resource referrals recommended: Neuropsychology, Support group (specify)  Discharge Planning Living Arrangements: Spouse/significant other Support Systems: Spouse/significant other, Children, Other relatives, Friends/neighbors, Church/faith community Type of Residence: Private residence Insurance Resources: Multimedia programmer (specify)([employer]-Medicare) Museum/gallery curator Resources: Social Security, Other (Comment)(business income) Financial Screen Referred: No Living Expenses: Own Money Management: Patient, Spouse, Family Does the patient have any problems obtaining your medications?: No Home Management: Wife and mostly daughter's Patient/Family Preliminary Plans: Return home with his wife and their children to assist. Wife has health issues-sleep issues and STM issues. Two daughter's are not working right now and can assist. Son would transport him to HD during the week. Will await therpay evaluations and work on discharge needs.  Clinical Impression Pleasant quite talkative gentleman who is motivated to improve and regain his independence. His family are very committed to him and involved they will assist at discharge. Will work on discharge needs and await team's goals for rehab. Do feel he would benefit from seeing neuro-psych while here.   Elease Hashimoto 02/11/2019, 2:03 PM

## 2019-02-11 NOTE — Care Management Note (Signed)
[facility] Individual Statement of Services  Patient Name:  Scott Mcdowell  Date:  02/11/2019  Welcome to the [facility].  Our goal is to provide you with an individualized program based on your diagnosis and situation, designed to meet your specific needs.  With this comprehensive rehabilitation program, you will be expected to participate in at least 3 hours of rehabilitation therapies Monday-Friday, with modified therapy programming on the weekends.  Your rehabilitation program will include the following services:  Physical Therapy (PT), Occupational Therapy (OT), 24 hour per day rehabilitation nursing, Neuropsychology, Case Management (Social Worker), Rehabilitation Medicine, Nutrition Services and Pharmacy Services  Weekly team conferences will be held on Wednesday to discuss your progress.  Your Social Worker will talk with you frequently to get your input and to update you on team discussions.  Team conferences with you and your family in attendance may also be held.  Expected length of stay: 18-21 days  Overall anticipated outcome: supervision-min wheelchair level-ambulation goals TBA  Depending on your progress and recovery, your program may change. Your Social Worker will coordinate services and will keep you informed of any changes. Your Social Worker's name and contact numbers are listed  below.  The following services may also be recommended but are not provided by the [facility]:    Central will be made to provide these services after discharge if needed.  Arrangements include referral to agencies that provide these services.  Your insurance has been verified to be:  [employer]-Medicare Your primary doctor is:  [name]  Pertinent information will be shared with your doctor and your insurance company.  Social Worker:  Ovidio Kin, [place] or ([phone]  Information discussed with and copy given to patient by: Elease Hashimoto, 02/11/2019, 12:52 PM

## 2019-02-11 NOTE — Evaluation (Addendum)
Physical Therapy Assessment and Plan  Patient Details  Name: Scott Mcdowell MRN: [id number] Date of Birth: 04/13/1941  PT Diagnosis: Abnormal posture, Abnormality of gait, Dizziness and giddiness, Edema, Hemiparesis dominant, Hypotonia and Muscle weakness Rehab Potential: Good ELOS: 18-21   Today's Date: 02/11/2019 PT Individual Time: 0900-1000 PT Individual Time Calculation (min): 60 min    Problem List:  Patient Active Problem List   Diagnosis Date Noted  . ESRD on dialysis ([place])   . History of GI bleed   . Debility 02/10/2019  . AKI (acute kidney injury) ([place])   . CKD (chronic kidney disease), stage IV ([place])   . 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 Pleural effusion   . History of CVA with residual deficit   . SVT (supraventricular tachycardia) ([place])   . Orthostasis   . ESRD (end stage renal disease) ([place])   . Gastrointestinal hemorrhage   . Pressure injury of skin 01/30/2019  . Goals of care, counseling/discussion 01/29/2019  . 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 Shock ([place]) 01/21/2019  . Acute blood loss anemia 01/21/2019  . Upper GI bleed 01/21/2019  . B12 deficiency anemia 01/15/2019  . Pleural effusion on right 01/15/2019  . Ascites 01/15/2019  . CAP (community acquired pneumonia) 01/15/2019  . Elevated troponin 01/15/2019  . Hypercalcemia 01/14/2019  . ARF (acute renal failure) ([place]) 01/14/2019  . Hyperkalemia 01/14/2019    Past Medical History:  Past Medical History:  Diagnosis Date  . Goals of care, counseling/discussion 01/29/2019  . HTN (hypertension)   . 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 Stroke [facility]) 2004   w right sided weakness   Past Surgical History:  Past Surgical History:  Procedure Laterality Date  . AV FISTULA PLACEMENT Left 02/05/2019   Procedure: ARTERIOVENOUS FISTULA CREATION LEFT ARM;  Surgeon: Elam Dutch, MD;  Location: [facility] OR;  Service: Vascular;  Laterality: Left;  .  ESOPHAGOGASTRODUODENOSCOPY N/A 01/21/2019   Procedure: ESOPHAGOGASTRODUODENOSCOPY (EGD);  Surgeon: Yetta Flock, MD;  Location: [facility] END[facility];  Service: Gastroenterology;  Laterality: N/A;  at bedside. PCCM intubating patient and he will be ready for EGD by 10: 30-10:40am  . ESOPHAGOGASTRODUODENOSCOPY (EGD) WITH PROPOFOL N/A 01/31/2019   Procedure: ESOPHAGOGASTRODUODENOSCOPY (EGD) WITH PROPOFOL;  Surgeon: Juanita Craver, MD;  Location: [facility] ENDOSCOPY;  Service: Endoscopy;  Laterality: N/A;  . HEMOSTASIS CLIP PLACEMENT  01/21/2019   Procedure: HEMOSTASIS CLIP PLACEMENT;  Surgeon: Yetta Flock, MD;  Location: [facility] ENDOSCOPY;  Service: Gastroenterology;;  . HEMOSTASIS CONTROL  01/21/2019   Procedure: HEMOSTASIS CONTROL;  Surgeon: Yetta Flock, MD;  Location: [facility] ENDOSCOPY;  Service: Gastroenterology;;  [place] Rm Probe  . INSERTION OF DIALYSIS CATHETER Right 02/05/2019   Procedure: INSERTION OF DIALYSIS CATHETER RIGHT INTERNAL JUGULAR;  Surgeon: Elam Dutch, MD;  Location: [place];  Service: Vascular;  Laterality: Right;  . IR FLUORO GUIDE CV LINE RIGHT  01/20/2019  . IR US GUIDE VASC ACCESS RIGHT  01/20/2019  . SCLEROTHERAPY  01/21/2019   Procedure: SCLEROTHERAPY;  Surgeon: Yetta Flock, MD;  Location: [facility] ENDOSCOPY;  Service: Gastroenterology;;    Assessment & Plan Clinical Impression:Scott Mcdowell is a 78 year old right-handed male with history of hypertension, CVA with residual right side weakness maintained on aspirin, morbid obesity.  Per chart review and patient, patient lives with his spouse.  Independent with assistive device prior to admission.  Presented to [facility] ED 01/15/2019 with shortness of breath and scrotal edema.  Labs were  obtained.  Sodium 137, potassium 5.6, BUN 80, creatinine 2.5.  Troponin  0.07.  COVID negative.  CT chest abdomen pelvis showed moderate right pleural effusion associated with atelectasis versus consolidation.  Mild left basilar scarring.   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  The right common iliac measuring 2.3 cm in caliber left common iliac measuring 2.3.  Testicular ultrasound no evidence of testicular mass.  Patient was transferred to [facility] for further evaluation.  He was noted to have a pleural effusion and had thoracentesis on 01/16/2019 with drainage of approximately 600 cc. He did require intubation through 01/22/2019.  Maintained on broad-spectrum antibiotics for suspected pneumonia.  Hospital course complicated by AKI with follow-up per renal services circulatory shock requiring vasopressors.  On 01/19/2019 he was found to have right brachial vein DVT and started on heparin.  He subsequently developed GI bleed and underwent EGD on 01/21/2019, which showed adherent clot friable mucosa distal gastric body area was clipped per Dr. Havery Moros.  His anticoagulation was initially discontinued due to high risk of bleeding.  To date patient received 6 units of packed red blood cells, 1 unit of platelets and 3 units of fresh frozen plasma.  Hemoglobin stabilized patient was transitioned to Eliquis for DVT and latest hemoglobin 7.6.  Patient was found to have a lytic rib lesion concerning for possible malignancy.  A multiple myeloma panel and bone marrow biopsy were performed with follow-up per oncology services Dr. Marin Olp.  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  Plan would be follow-up outpatient with oncology services.  Patient's creatinine 6.50-4.61 and hemodialysis has been initiated plan long-term hemodialysis Tuesday Thursday Saturday schedule.  Patient with right IJ tunneled catheter and left brachiocephalic AV fistula has been placed.  Patient with episodic SVT 02/08/2019 initially on metoprolol discontinued started on amiodarone  loaded 200 mg twice daily changed to 100 mg twice daily 02/10/2019.  Patient with reportedly multi-decubiti sacrum and heels. Prevalon boots in place and frequent turning to maintain skin integrity.   Patient transferred to CIR on 02/10/2019 .   Patient currently requires total with mobility secondary to muscle weakness and muscle joint tightness, decreased cardiorespiratoy endurance, impaired timing and sequencing, abnormal tone and unbalanced muscle activation and decreased sitting balance, decreased standing balance, hemiplegia and decreased balance strategies.  Prior to hospitalization, patient was modified independent  with mobility and lived with Spouse in a House home.  Home access is 3 (uses door/frame) to hold on toStairs to enter.  Patient will benefit from skilled PT intervention to maximize safe functional mobility, minimize fall risk and decrease caregiver burden for planned discharge home with 24 hour supervision.  Anticipate patient will benefit from follow up [place] at discharge.  PT - End of Session Activity Tolerance: Tolerates < 10 min activity, no significant change in vital signs Endurance Deficit: Yes Endurance Deficit Description: pt fatigued after +2 transfer PT Assessment Rehab Potential (ACUTE/IP ONLY): Good PT Barriers to Discharge: Inaccessible home environment PT Barriers to Discharge Comments: pt has 2 STE no rail; has some type of bar on door frame PT Patient demonstrates impairments in the following area(s): Balance;Edema;Skin Integrity;Endurance;Motor PT Transfers Functional Problem(s): Bed Mobility;Bed to Chair;Car;Furniture PT Locomotion Functional Problem(s): Ambulation;Wheelchair Mobility;Stairs PT Plan PT Intensity: Minimum of 1-2 x/day ,45 to 90 minutes PT Frequency: 5 out of 7 days  PT Duration Estimated Length of Stay: 18-21 PT Treatment/Interventions: Ambulation/gait training;Discharge planning;DME/adaptive equipment instruction;Functional mobility  training;Pain management;Psychosocial support;Splinting/orthotics;Therapeutic Activities;UE/LE Strength taining/ROM;Balance/vestibular training;Community reintegration;Functional electrical stimulation;Neuromuscular re-education;Patient/family education;Stair training;Therapeutic Exercise;UE/LE Coordination activities;Wheelchair propulsion/positioning PT Transfers Anticipated Outcome(s): supervision basic, min car PT Locomotion Anticipated Outcome(s): superviison w/c x 150'; gait and steps TBD PT Recommendation Recommendations for Other Services: Therapeutic Recreation consult Follow Up Recommendations: Home health PT Patient destination: Home Equipment Recommended: To be determined;Wheelchair (measurements);Wheelchair cushion (measurements) Equipment Details: owns a s SPC and RW  Skilled Therapeutic Intervention Pt resting in bed.  Evaluation completed.  Pt participated to the best of his ability, but is quite weak and deconditioned.  Chart states that pt walked indoors "on a good day".  He reported full recovery from CVA in 2004.  Hemiparesis has re-emerged due to debility.  Pt dizzy upon sitting up.  This resolved in about 1 minute.  Seated Therapeutic exercise performed with LE to increase strength for functional mobility: 10 x 1 bil glut sets, L long arc quad knee extension with emphasis on eccentric control. R glut activation noted.  PT required rest break between q sets. Pt spontaneously attempts to use RUE and RLE during mobility.  When attempting to propel w/c with LUE only, he did not problem-solve any way in which to avoid going in circles.  Using [place], sit>< stand x 4 throughout session.  1st bout +2 required; later sit> stands +1.  Pt is tall and tends to lean forward somewhat once in standing; needed cues to remain safe.   Pt quite fatigued by end of session and requested getting back to bed.  Transfer with Charlaine Dalton to return to bed.   Pt left with needs at hand and bed alarm set.     PT Evaluation Precautions/Restrictions Precautions Precautions: Fall Precaution Comments: R hemiparesis, h/o 1 fall in past 1 year; hemiparesis more apparent due to debility   Pain Pain Assessment Pain Scale: 0-10 Pain Score: 0-No pain Faces Pain Scale: No hurt Home Living/Prior Functioning Home Living Available Help at Discharge: Family;Available 24 hours/day Type of Home: House Home Access: Stairs to enter [employer] of Steps: 3 (uses door/frame) to hold on to Entrance Stairs-Rails: None(pt reports that he might be able to put up a railing) Home Layout: Two level;Able to live on main level with bedroom/bathroom;1/2 bath on main level(shower is upstairs) Bathroom Shower/Tub: Tub/shower unit;Curtain Bathroom Toilet: Standard(reports daughter installing handicap height toilet)  Lives With: Spouse Prior Function Level of Independence: Independent with transfers;Needs assistance with ADLs(occasional use of RW or cane) Bath: Minimal  Able to Take Stairs?: Yes Driving: No Comments: uses RW vs cane PRN. "On a good day" pt can ambulate short distances unassisted.   Was able to write and use R hand functionally.  loves gardening. Vision/Perception  Pt wears bifocals.  He reports no change in vision.  He has an unusual default gaze:  into the distance. He will make eye contact when requested.    Cognition Overall Cognitive Status: Within Functional Limits for tasks assessed Arousal/Alertness: Awake/alert Attention: Sustained;Selective Sustained Attention: Appears intact Selective Attention: Appears intact Memory: Appears intact; pt remembered 3/3 words over 10 min Awareness: Appears intact Problem Solving: Impaired Safety/Judgment: Appears intact Sensation Sensation Light Touch: Appears Intact Proprioception: Appears Intact Coordination Gross Motor Movements are Fluid and Coordinated: No Coordination and Movement Description: R hemi Finger Nose Finger Test:  unable RUE Heel Shin Test: unable RLE Motor  Motor Motor: Hemiplegia;Abnormal tone Motor - Skilled Clinical Observations: hypotonic  Mobility Bed Mobility Bed Mobility: Rolling Right;Rolling Left;Left Sidelying to Sit Rolling Right: Supervision/verbal cueing Rolling Left: Moderate Assistance - Patient 50-74% Left Sidelying to Sit: Moderate Assistance - Patient 50-74% Transfers Transfers: Corporate treasurer Transfers: 2 Helpers   Locomotion  Gait Ambulation: No Gait Gait: No Stairs / Additional Locomotion Stairs: Yes Wheelchair Mobility Wheelchair Mobility: Yes Wheelchair Assistance: Moderate Assistance - Patient 50 - 74% Wheelchair Propulsion: Left upper extremity Wheelchair Parts Management: Needs assistance Distance: 10  Trunk/Postural Assessment  Cervical Assessment Cervical Assessment: Within Functional Limits Thoracic Assessment Thoracic Assessment: Within Functional Limits Lumbar Assessment Lumbar Assessment: Exceptions to WFL(increased wt bearing L) Postural Control Postural Control: Within Functional Limits  Balance Balance Balance Assessed: Yes Static Sitting Balance Static Sitting - Level of Assistance: 5: Stand by assistance Dynamic Sitting Balance Dynamic Sitting - Level of Assistance: 5: Stand by assistance Static Standing Balance Static Standing - Level of Assistance: Other (comment)(in [place], with bil UE and bil knee support, stand by assistance) Dynamic Standing Balance Dynamic Standing - Level of Assistance: Not tested (comment) Extremity Assessment  RUE Assessment RUE Assessment: Exceptions to [facility] Passive Range of Motion (PROM) Comments: Shoulder flexion to 90*, elbow flexion/extension WNL Active Range of Motion (AROM) Comments: shoulder flexion to 20* gravity eliminated, minimal elbow flexion, loose gross grasp General Strength Comments: 1-2/5 LUE Assessment LUE Assessment: Within Functional Limits General Strength Comments:  5/5 RLE Assessment RLE Assessment: Exceptions to [facility] Moderate edema throughout; pitting Active Range of Motion (AROM) Comments: extensor and flexor synergies noted General Strength Comments: active hip flexors, knee extensors, ankle DF- mostly toe ext LLE Assessment LLE Assessment: Exceptions to [facility] Moderate edema throughout, pitting Passive Range of Motion (PROM) Comments: tight hamstrings General Strength Comments: grossly in sitting: 2-/5 hip flex, 4-/5 knee ext, 4+/5 ankle DF    Refer to Care Plan for Long Term Goals  Recommendations for other services: Therapeutic Recreation  Other leisure; pt was an avid gardener  Discharge Criteria: Patient will be discharged from PT if patient refuses treatment 3 consecutive times without medical reason, if treatment goals not met, if there is a change in medical status, if patient makes no progress towards goals or if patient is discharged from hospital.  The above assessment, treatment plan, treatment alternatives and goals were discussed and mutually agreed upon: by patient  Courtlyn Aki 02/11/2019, 10:25 AM

## 2019-02-12 ENCOUNTER — Inpatient Hospital Stay (HOSPITAL_COMMUNITY): Payer: Medicare Other | Admitting: Occupational Therapy

## 2019-02-12 ENCOUNTER — Inpatient Hospital Stay (HOSPITAL_COMMUNITY): Payer: Medicare Other

## 2019-02-12 DIAGNOSIS — Z8679 Personal history of other diseases of the circulatory system: Secondary | ICD-10-CM

## 2019-02-12 DIAGNOSIS — T380X5A Adverse effect of glucocorticoids and synthetic analogues, initial encounter: Secondary | ICD-10-CM

## 2019-02-12 DIAGNOSIS — R739 Hyperglycemia, unspecified: Secondary | ICD-10-CM

## 2019-02-12 LAB — KAPPA/LAMBDA LIGHT CHAINS
Kappa free light chain: 43.7 mg/L — ABNORMAL HIGH (ref 3.3–19.4)
Kappa, lambda light chain ratio: 0 — ABNORMAL LOW (ref 0.26–1.65)
Lambda free light chains: 13044.3 mg/L — ABNORMAL HIGH (ref 5.7–26.3)

## 2019-02-12 LAB — GLUCOSE, CAPILLARY: Glucose-Capillary: 117 mg/dL — ABNORMAL HIGH (ref 70–99)

## 2019-02-12 MED ORDER — CHLORHEXIDINE GLUCONATE CLOTH 2 % EX PADS
6.0000 | MEDICATED_PAD | Freq: Every day | CUTANEOUS | Status: DC
  Administered 2019-02-13 – 2019-02-14 (×2): 6 via TOPICAL

## 2019-02-12 MED ORDER — SODIUM CHLORIDE 0.9 % IV SOLN
125.0000 mg | INTRAVENOUS | Status: AC
  Administered 2019-02-13 – 2019-02-20 (×4): 125 mg via INTRAVENOUS
  Filled 2019-02-12 (×8): qty 10

## 2019-02-12 NOTE — Progress Notes (Signed)
Occupational Therapy Session Note  Patient Details  Name: Scott Mcdowell MRN: [id number] Date of Birth: 10-Jul-1941  Today's Date: 02/12/2019 OT Individual Time: 0845-1000 OT Individual Time Calculation (min): 75 min    Short Term Goals: Week 1:  OT Short Term Goal 1 (Week 1): Pt will complete bathing with mod assist at sit > stand level OT Short Term Goal 2 (Week 1): Pt will complete LB dressing at max assist level OT Short Term Goal 3 (Week 1): Pt will complete toilet transfer max assist stand pivot OT Short Term Goal 4 (Week 1): Pt will complete 1 grooming task in standing for increased standing tolerance  Skilled Therapeutic Interventions/Progress Updates:    Treatment session with focus on ADL retraining with bed mobility, functional transfers, and sit > stand during bathing/dressing.  Pt received supine in bed reporting may have had a BM prior to session.  Engaged in rolling with mod assist, therapist assisted with hygiene post BM and doffing clean brief.  Completed rolling to EOB with mod assist.  Squat pivot transfer to Lt mod-max assist +2, pt demonstrating minimal weight shift during transfer.  Engaged in bathing and dressing at sit > stand level at sink with pt requiring intermittent cues as he is quite verbose and easily distracted.  Mod assist bathing with therapist assisting to position RUE to allow washing and then washing Lt arm.  Attempted sit > stand x2 with +2 assist, pt unable to maintain standing long enough to complete LB dressing.  Therefore utilized [employer] for Probation officer with +2 for sit > stand in [place].  Applied TEDS for edema management and elevating leg rests for comfort and edema management.  Pt left upright in w/c with seat belt alarm on, all needs in reach, and half lap tray for RUE positioning.  Therapy Documentation Precautions:  Precautions Precautions: Fall Precaution Comments: R hemiparesis, h/o 1 fall in past 1 year; hemiparesis more apparent  due to debility Restrictions Weight Bearing Restrictions: No Pain:  Pt with no c/o pain   Therapy/Group: Individual Therapy  Simonne Come 02/12/2019, 12:14 PM

## 2019-02-12 NOTE — Progress Notes (Signed)
Physical Therapy Session Note  Patient Details  Name: Scott Mcdowell MRN: [id number] Date of Birth: 12/14/1940  Today's Date: 02/12/2019 PT Individual Time: 1120-1235 PT Individual Time Calculation (min): 75 min   Short Term Goals: Week 1:  PT Short Term Goal 1 (Week 1): pt will move supine> sit wiht min assist PT Short Term Goal 2 (Week 1): pt will move sit> supine with mod/max assist PT Short Term Goal 3 (Week 1): pt will transfer bed>< w/c wiht assistance of 1 person PT Short Term Goal 4 (Week 1): pt will initiate gait training  Skilled Therapeutic Interventions/Progress Updates:     Patient in w/c upon PT arrival. Patient alert and agreeable to PT session.  Therapeutic Activity: Transfers: Patient performed slide board transfers to from a mat table with mod A of 2 to the L and mod A of 1 to the R. Provided verbal cues for board placement, hand position, head-hips relationship, and pushing through legs and UE to lift his bottom. He attempted sit to/from stand x3 in the parallel bars, but was unable to lift bottom from the w/c despite PT demonstration, cueing, and max A of 2. He then performed sit to/from stand facing one of the bars and pulled up with B UE pulling himself forward into standing with max A of 2 and PT blocking his R knee to facilitate extension. Able to tolerate standing with mod A of 2 for up to 1 minute before needing to sit to rest, PT remained blocking R knee in standing.  Wheelchair Mobility:  Patient propelled wheelchair 100 feet with close supervision using his L UE and LE with hemi technique. Provided verbal cues for use of LE to steer, coordination of UE and LE, and turning technique. Educated patient on use of breaks throughout session.   Neuromuscular Re-ed: Patient performed sitting balance EOM with supervision for 2 minutes with cues for erect posture and scapular retraction. He performed side bending in sitting to each elbow x3 with CGA-supervision to  recover to midline, with increased time and effort to the R.  He performed weight shifts in standing with manual facilitation form therapist and PT blocking R knee.   Patient in w/c at end of session with breaks locked, seat belt alarm set, and all needs within reach. Patient reported that he slides in the chair, PT educated on scooting technique to keep his hips back in the chair, patient demonstrated scooting and was able to perform it independently, and to call for assistance if he began to slide and was unable to scoot back. Also educated patient on scooting and shifting in the chair for pressure relief.    Therapy Documentation Precautions:  Precautions Precautions: Fall Precaution Comments: R hemiparesis, h/o 1 fall in past 1 year; hemiparesis more apparent due to debility Restrictions Weight Bearing Restrictions: No Pain: Patient denied pain throughout session.    Therapy/Group: Individual Therapy  [address] L Shon Indelicato PT, DPT  02/12/2019, 4:34 PM

## 2019-02-12 NOTE — Progress Notes (Signed)
Occupational Therapy Session Note  Patient Details  Name: DEAVIN FORST MRN: [id number] Date of Birth: 11/18/1940  Today's Date: 02/12/2019 OT Individual Time: 1330-1430 OT Individual Time Calculation (min): 60 min    Short Term Goals: Week 1:  OT Short Term Goal 1 (Week 1): Pt will complete bathing with mod assist at sit > stand level OT Short Term Goal 2 (Week 1): Pt will complete LB dressing at max assist level OT Short Term Goal 3 (Week 1): Pt will complete toilet transfer max assist stand pivot OT Short Term Goal 4 (Week 1): Pt will complete 1 grooming task in standing for increased standing tolerance  Skilled Therapeutic Interventions/Progress Updates:    Pt resting in w/c upon arrival and agreeable to therapy.  OT intervention with focus on sit<>stand and standing balance to increase independence with BADLs.  Pt performed sit<>stand X 4 (2 X pulling up on parallel bar and 2 X in [place]). Pt requires max A +2.  Pt with limited use of RUE at present.  Pt required max A+2 for sitting EOB>supine. Pt remained in bed with all needs within reach and bed alarm activated.   Therapy Documentation Precautions:  Precautions Precautions: Fall Precaution Comments: R hemiparesis, h/o 1 fall in past 1 year; hemiparesis more apparent due to debility Restrictions Weight Bearing Restrictions: No Pain:  Pt c/o buttocks discomfort (unrated); repositioned and returned to air mattress bed at end of session   Therapy/Group: Individual Therapy  [name] 02/12/2019, 2:46 PM

## 2019-02-12 NOTE — Progress Notes (Signed)
[place] PHYSICAL MEDICINE & REHABILITATION PROGRESS NOTE  Subjective/Complaints: Patient seen laying in bed this morning.  He states he slept fairly overnight.  He states he had a good first day of therapies yesterday.  He notes significant decrease in edema.  He was seen by nephro yesterday, notes reviewed.  ROS: Denies CP, shortness of breath, nausea, vomiting, diarrhea.  Objective: Vital Signs: Blood pressure 95/81, pulse 87, temperature 98.2 F (36.8 C), temperature source Oral, resp. rate 17, height 6\' 2"  (1.88 m), weight 104.2 kg, SpO2 94 %. No results found. Recent Labs    02/10/19 0542 02/11/19 1314  WBC 6.6 6.2  HGB 7.7* 7.4*  HCT 24.2* 24.1*  PLT 185 178   Recent Labs    02/10/19 0542 02/11/19 1314  NA 136 137  K 4.3 4.5  CL 97* 98  CO2 26 26  GLUCOSE 109* 125*  BUN 40* 60*  CREATININE 4.28* 5.93*  CALCIUM 9.1 9.2    Physical Exam: BP 95/81 (BP Location: Left Leg)   Pulse 87   Temp 98.2 F (36.8 C) (Oral)   Resp 17   Ht 6\' 2"  (1.88 m)   Wt 104.2 kg   SpO2 94%   BMI 29.49 kg/m  Constitutional: No distress . Vital signs reviewed.  Obese. HENT: Normocephalic.  Atraumatic. Eyes: EOMI.  No discharge. Cardiovascular: No JVD. Respiratory: Normal effort. GI: Non-distended. Musc: Generalized edema, continues to improve Neurological: Alert Follows commands Motor: Left upper extremity: 5/5 proximal distal Left lower extremity: 4-4+/5 proximal distal Right upper extremity: 3-/5 proximal distal, unchanged Right lower extremity: 2+-2-/5 proximal to distal, unchanged  Skin: Skin is warm and dry.  Sacral ulcer not examined  Psychiatric: He has a normal mood and affect. His behavior is normal  Assessment/Plan: 1. Functional deficits secondary to debility which require 3+ hours per day of interdisciplinary therapy in a comprehensive inpatient rehab setting.  Physiatrist is providing close team supervision and 24 hour management of active medical problems  listed below.  Physiatrist and rehab team continue to assess barriers to discharge/monitor patient progress toward functional and medical goals  Care Tool:  Bathing    Body parts bathed by patient: Right arm, Chest, Abdomen, Right upper leg, Left upper leg, Face   Body parts bathed by helper: Left arm, Front perineal area, Buttocks, Right lower leg, Left lower leg     Bathing assist Assist Level: 2 Helpers     Upper Body Dressing/Undressing Upper body dressing   What is the patient wearing?: Pull over shirt    Upper body assist Assist Level: Maximal Assistance - Patient 25 - 49%    Lower Body Dressing/Undressing Lower body dressing    Lower body dressing activity did not occur: Refused What is the patient wearing?: Pants     Lower body assist Assist for lower body dressing: 2 Helpers     Toileting Toileting    Toileting assist Assist for toileting: Maximal Assistance - Patient 25 - 49%     Transfers Chair/bed transfer  Transfers assist     Chair/bed transfer assist level: 2 Helpers     Locomotion Ambulation   Ambulation assist   Ambulation activity did not occur: Safety/medical concerns(weakness)          Walk 10 feet activity   Assist           Walk 50 feet activity   Assist           Walk 150 feet activity   Assist Walk  150 feet activity did not occur: Safety/medical concerns(weakness)         Walk 10 feet on uneven surface  activity   Assist           Wheelchair     Assist Will patient use wheelchair at discharge?: Yes Type of Wheelchair: Manual    Wheelchair assist level: Moderate Assistance - Patient 50 - 74% Max wheelchair distance: 10    Wheelchair 50 feet with 2 turns activity    Assist    Wheelchair 50 feet with 2 turns activity did not occur: Safety/medical concerns(weakness)       Wheelchair 150 feet activity     Assist Wheelchair 150 feet activity did not occur: Safety/medical  concerns(weakness)          Medical Problem List and Plan: 1.  Debility secondary to acute renal failure/ascites/multi-medical  Continue CIR 2.  Antithrombotics:   Right brachiocephalic DVT 08/22/4313: Eliquis             -antiplatelet therapy: N/A 3. Pain Management: Hydrocodone as needed 4. Mood: Provide emotional support             -antipsychotic agents: N/A 5. Neuropsych: This patient is capable of making decisions on his own behalf. 6. Skin/Wound Care/multiple decubiti sacrum/heels.:  Prevalon boots and follow-up wound care nurse routine skin checks 7. Fluids/Electrolytes/Nutrition: Routine in and outs.   8.  GI bleed.  EGD 01/21/2019.  Follow-up gastroenterology recommendations.    Hemoglobin 7.4 on 6/26  Continue to monitor 9.  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  Status post left brachiocephalic AV fistula.  Hemodialysis Tuesday Thursday Saturday.  Recommendation per nephro. 10.  Plasma cell myeloma.  Outpatient follow-up with oncology to discuss formal chemotherapy.  Appreciate oncology recommendations  Per oncology, considering ordering repeat dose of Decadron 11.  Right pleural effusion.  Status post thoracentesis 01/16/2019. 12.  Orthostasis.  ProAmatine 10 mg 3 times daily.    Hypotensive, but asymptomatic on 6/26  Monitor with increased mobility 13.  Episodic SVT.  8 beats noted 02/08/2019.  Amiodarone as directed.    Stable at present  Monitor with increased mobility 13.  History of CVA with recurrent right hemiparesis. 14.  Steroid-induced hyperglycemia  Continue monitor  LOS: 2 days A FACE TO FACE EVALUATION WAS PERFORMED  Lillyonna Armstead Lorie Phenix 02/12/2019, 10:34 AM

## 2019-02-12 NOTE — Plan of Care (Signed)
  Problem: Consults Goal: RH GENERAL PATIENT EDUCATION Description: See Patient Education module for education specifics. Outcome: Progressing Goal: Skin Care Protocol Initiated - if Braden Score 18 or less Description: If consults are not indicated, leave blank or document N/A Outcome: Progressing   Problem: RH BOWEL ELIMINATION Goal: RH STG MANAGE BOWEL WITH ASSISTANCE Description: STG Manage Bowel with Assistance. Min Outcome: Progressing Goal: RH STG MANAGE BOWEL W/MEDICATION W/ASSISTANCE Description: STG Manage Bowel with Medication with Assistance. Min Outcome: Progressing   Problem: RH SKIN INTEGRITY Goal: RH STG SKIN FREE OF INFECTION/BREAKDOWN Description: Skin free of infection & further breakdown. Mod assistance. Outcome: Progressing Goal: RH STG MAINTAIN SKIN INTEGRITY WITH ASSISTANCE Description: STG Maintain Skin Integrity With Assistance. Mod assist. Outcome: Progressing   Problem: RH SAFETY Goal: RH STG ADHERE TO SAFETY PRECAUTIONS W/ASSISTANCE/DEVICE Description: STG Adhere to Safety Precautions With Assistance/Device. Mod assist. Outcome: Progressing   Problem: RH PAIN MANAGEMENT Goal: RH STG PAIN MANAGED AT OR BELOW PT'S PAIN GOAL Description: Pain level <3. Outcome: Progressing

## 2019-02-12 NOTE — Progress Notes (Signed)
[facility]    NEPHROLOGY PROGRESS NOTE  SUBJECTIVE:  He was moved to inpatient rehab.  Last HD on 6/25 with 1.9 kg UF.  Review of systems:  Denies shortness of breath or chest pain  Denies nausea or vomiting  Feels like edema is improving.  Has lots of gas.  OBJECTIVE:  Vitals:   02/12/19 0505 02/12/19 1328  BP: 95/81 (!) 178/85  Pulse: 87 83  Resp: 17 20  Temp: 98.2 F (36.8 C) 97.8 F (36.6 C)  SpO2: 94% 99%    Intake/Output Summary (Last 24 hours) at 02/12/2019 1643 Last data filed at 02/12/2019 0700 Gross per 24 hour  Intake 120 ml  Output -  Net 120 ml      General:  AAOx3 NAD talkative CV:  RRR  Lungs:  Clear anteriorly unlabored  Abd:  abd SNT/ND with normal BS Extremities: 2-3+ bilateral lower extremity edemamild arm edema on the right upper arm;  Access:  RIJ tunneled catheter - guaze at exit site - some blood ; LUE AVF with bruit and thrill ,    MEDICATIONS:  . apixaban  5 mg Oral BID  . Chlorhexidine Gluconate Cloth  6 each Topical Q0600  . [START ON 02/16/2019] darbepoetin (ARANESP) injection - DIALYSIS  200 mcg Intravenous Q Tue-HD  . dexamethasone  16 mg Oral Q12H  . feeding supplement (NEPRO CARB STEADY)  237 mL Oral TID BM  . liver oil-zinc oxide   Topical 5 X Daily  . midodrine  10 mg Oral TID WC  . multivitamin  1 tablet Oral QHS  . nystatin   Topical TID  . pantoprazole  40 mg Oral BID  . polyethylene glycol  17 g Oral Daily  . senna-docusate  1 tablet Oral QHS  . sevelamer carbonate  2.4 g Oral TID WC       LABS:   CBC Latest Ref Rng & Units 02/11/2019 02/10/2019 02/09/2019  WBC 4.0 - 10.5 K/uL 6.2 6.6 7.7  Hemoglobin 13.0 - 17.0 g/dL 7.4(L) 7.7(L) 7.6(L)  Hematocrit 39.0 - 52.0 % 24.1(L) 24.2(L) 23.3(L)  Platelets 150 - 400 K/uL 178 185 PLATELET CLUMPS NOTED ON SMEAR, UNABLE TO ESTIMATE    CMP Latest Ref Rng & Units 02/11/2019 02/10/2019 02/09/2019  Glucose 70 - 99 mg/dL 125(H) 109(H) 100(H)  BUN 8 - 23 mg/dL 60(H)  40(H) 77(H)  Creatinine 0.61 - 1.24 mg/dL 5.93(H) 4.28(H) 6.82(H)  Sodium 135 - 145 mmol/L 137 136 136  Potassium 3.5 - 5.1 mmol/L 4.5 4.3 5.2(H)  Chloride 98 - 111 mmol/L 98 97(L) 100  CO2 22 - 32 mmol/L 26 26 23   Calcium 8.9 - 10.3 mg/dL 9.2 9.1 8.9  Total Protein 6.5 - 8.1 g/dL - - 5.6(L)  Total Bilirubin 0.3 - 1.2 mg/dL - - 0.9  Alkaline Phos 38 - 126 U/L - - 66  AST 15 - 41 U/L - - 30  ALT 0 - 44 U/L - - 17    Lab Results  Component Value Date   PTH 40 02/02/2019   PTH Comment 02/02/2019   CALCIUM 9.2 02/11/2019   PHOS 4.4 02/11/2019       Component Value Date/Time   COLORURINE YELLOW 01/17/2019 1029   APPEARANCEUR HAZY (A) 01/17/2019 1029   LABSPEC 1.012 01/17/2019 1029   PHURINE 5.0 01/17/2019 1029   GLUCOSEU NEGATIVE 01/17/2019 [place] 01/17/2019 [place] 01/17/2019 1029   KETONESUR 5 (A) 01/17/2019 1029   PROTEINUR  NEGATIVE 01/17/2019 1029   NITRITE NEGATIVE 01/17/2019 1029   LEUKOCYTESUR NEGATIVE 01/17/2019 1029      Component Value Date/Time   PHART 7.414 01/22/2019 0839   PCO2ART 36.8 01/22/2019 0839   PO2ART 104 01/22/2019 0839   HCO3 23.1 01/22/2019 0839   ACIDBASEDEF 0.8 01/22/2019 0839   O2SAT 97.8 01/22/2019 0839       Component Value Date/Time   IRON 41 (L) 01/30/2019 0308   TIBC 305 01/30/2019 0308   FERRITIN 724 (H) 01/30/2019 0308   IRONPCTSAT 13 (L) 01/30/2019 0308       ASSESSMENT/PLAN:    78 year old male patient who presented with pneumonia complicated by acute kidney injury requiring renal replacement therapy.  He is dialyzing via a tunneled right IJ catheter.  He had subsequent respiratory failure requiring intubation, and acute blood loss anemia from upper GI bleeding.  He also had a right upper extremity DVT requiring anticoagulation on apixiban - hold heparin with HD   1.  Acute kidney injury which was felt to have progressed to ESRD.  Appears CRRT started 6/3 and iHD started 6/11.   - HD today per  TTS schedule - HD tomorrow - He is dialyzing via a right IJ tunnel catheter.  He has a left brachiocephalic AV fistula in place.   - Given advanced underlying renal failure, he was felt to have progressed to ESRD. He has a outpatient spot for TTS at [facility] at 12:15  - he has received midodrine to support BP. Currently ordered at 10 mg TID    2.  Chronic kidney disease stage IV.  His serum creatinine was 2.4 on admission with concern for progression as above.   3.  Pneumonia.  Resolved.  4.  Plasma cell myeloma.  Per oncology.  5.  Right upper extremity DVT.  On eliquis per primary team   6. Anemia 2/2 CKD hgb 7.4 trending down - transfuse prn - Aranesp 200 scheduled 6/30- last 6/23  - no acute indication for PRBC's. - tsat 13% 6/13  Ferritin 724 - last feraheme 510 6/15 - will given ferrilicit 169 x 4 more doses   7. He has had episodic SVT for which primary team initiated amio  8. BP/volume - continue to titrate volume down as BP allows  9. Nutrition - nepro and vits to supplement diet

## 2019-02-13 ENCOUNTER — Inpatient Hospital Stay (HOSPITAL_COMMUNITY): Payer: Medicare Other | Admitting: Occupational Therapy

## 2019-02-13 ENCOUNTER — Inpatient Hospital Stay (HOSPITAL_COMMUNITY): Payer: Medicare Other | Admitting: Physical Therapy

## 2019-02-13 ENCOUNTER — Inpatient Hospital Stay (HOSPITAL_COMMUNITY): Payer: Medicare Other

## 2019-02-13 LAB — CBC
HCT: 24 % — ABNORMAL LOW (ref 39.0–52.0)
Hemoglobin: 7.5 g/dL — ABNORMAL LOW (ref 13.0–17.0)
MCH: 32.6 pg (ref 26.0–34.0)
MCHC: 31.3 g/dL (ref 30.0–36.0)
MCV: 104.3 fL — ABNORMAL HIGH (ref 80.0–100.0)
Platelets: 194 10*3/uL (ref 150–400)
RBC: 2.3 MIL/uL — ABNORMAL LOW (ref 4.22–5.81)
RDW: 19.1 % — ABNORMAL HIGH (ref 11.5–15.5)
WBC: 6.8 10*3/uL (ref 4.0–10.5)
nRBC: 0 % (ref 0.0–0.2)

## 2019-02-13 LAB — RENAL FUNCTION PANEL
Albumin: 3.8 g/dL (ref 3.5–5.0)
Anion gap: 15 (ref 5–15)
BUN: 80 mg/dL — ABNORMAL HIGH (ref 8–23)
CO2: 24 mmol/L (ref 22–32)
Calcium: 9.2 mg/dL (ref 8.9–10.3)
Chloride: 98 mmol/L (ref 98–111)
Creatinine, Ser: 6.09 mg/dL — ABNORMAL HIGH (ref 0.61–1.24)
GFR calc Af Amer: 9 mL/min — ABNORMAL LOW (ref >60)
GFR calc non Af Amer: 8 mL/min — ABNORMAL LOW (ref >60)
Glucose, Bld: 151 mg/dL — ABNORMAL HIGH (ref 70–99)
Phosphorus: 5 mg/dL — ABNORMAL HIGH (ref 2.5–4.6)
Potassium: 4.7 mmol/L (ref 3.5–5.1)
Sodium: 137 mmol/L (ref 135–145)

## 2019-02-13 MED ORDER — HEPARIN SODIUM (PORCINE) 1000 UNIT/ML DIALYSIS
1000.0000 [IU] | INTRAMUSCULAR | Status: DC | PRN
  Administered 2019-02-13: 3400 [IU] via INTRAVENOUS_CENTRAL
  Filled 2019-02-13: qty 1

## 2019-02-13 MED ORDER — ALTEPLASE 2 MG IJ SOLR: 2.0000 mg | Freq: Once | INTRAMUSCULAR | Status: DC | PRN

## 2019-02-13 MED ORDER — SODIUM CHLORIDE 0.9 % IV SOLN: 100.0000 mL | INTRAVENOUS | Status: DC | PRN

## 2019-02-13 MED ORDER — ALBUMIN HUMAN 25 % IV SOLN
25.0000 g | Freq: Once | INTRAVENOUS | Status: AC
  Administered 2019-02-13: 25 g via INTRAVENOUS

## 2019-02-13 MED ORDER — ALBUMIN HUMAN 25 % IV SOLN
INTRAVENOUS | Status: AC
  Administered 2019-02-13: 25 g via INTRAVENOUS
  Filled 2019-02-13: qty 100

## 2019-02-13 MED ORDER — HEPARIN SODIUM (PORCINE) 1000 UNIT/ML IJ SOLN
INTRAMUSCULAR | Status: AC
  Administered 2019-02-13: 3400 [IU] via INTRAVENOUS_CENTRAL
  Filled 2019-02-13: qty 4

## 2019-02-13 MED ORDER — LIDOCAINE-PRILOCAINE 2.5-2.5 % EX CREA: 1.0000 "application " | TOPICAL_CREAM | CUTANEOUS | Status: DC | PRN

## 2019-02-13 MED ORDER — PENTAFLUOROPROP-TETRAFLUOROETH EX AERO: 1.0000 "application " | INHALATION_SPRAY | CUTANEOUS | Status: DC | PRN

## 2019-02-13 MED ORDER — SIMETHICONE 80 MG PO CHEW
80.0000 mg | CHEWABLE_TABLET | Freq: Three times a day (TID) | ORAL | Status: DC
  Administered 2019-02-13 – 2019-03-05 (×56): 80 mg via ORAL
  Filled 2019-02-13 (×57): qty 1

## 2019-02-13 MED ORDER — LIDOCAINE HCL (PF) 1 % IJ SOLN: 5.0000 mL | INTRAMUSCULAR | Status: DC | PRN

## 2019-02-13 MED ORDER — MIDODRINE HCL 5 MG PO TABS
ORAL_TABLET | ORAL | Status: AC
  Filled 2019-02-13: qty 2

## 2019-02-13 NOTE — Progress Notes (Signed)
[facility]    NEPHROLOGY PROGRESS NOTE  SUBJECTIVE:  He was moved to inpatient rehab 6/24.  Last HD on 6/25 with 1.9 kg UF. For HD today  OBJECTIVE:  Vitals:   02/12/19 1931 02/13/19 0458  BP: 109/65 104/78  Pulse: 86 86  Resp: 19 18  Temp: (!) 97.3 F (36.3 C) 98 F (36.7 C)  SpO2: 99% 96%    Intake/Output Summary (Last 24 hours) at 02/13/2019 1156 Last data filed at 02/13/2019 0900 Gross per 24 hour  Intake 717 ml  Output 100 ml  Net 617 ml      General:  NAD seems SOB but he denies CV:  RRR  Lungs:  Clear anteriorly unlabored  Abd:  obese SNT with normal BS, some flank edema Extremities: 2-3+ bilateral lower extremity edem amild arm edema on the right upper arm;  Access:  RIJ tunneled catheter -  LUE AVF with bruit and thrill ,    MEDICATIONS:  . apixaban  5 mg Oral BID  . Chlorhexidine Gluconate Cloth  6 each Topical Q0600  . [START ON 02/16/2019] darbepoetin (ARANESP) injection - DIALYSIS  200 mcg Intravenous Q Tue-HD  . dexamethasone  16 mg Oral Q12H  . feeding supplement (NEPRO CARB STEADY)  237 mL Oral TID BM  . liver oil-zinc oxide   Topical 5 X Daily  . midodrine  10 mg Oral TID WC  . multivitamin  1 tablet Oral QHS  . nystatin   Topical TID  . pantoprazole  40 mg Oral BID  . polyethylene glycol  17 g Oral Daily  . senna-docusate  1 tablet Oral QHS  . sevelamer carbonate  2.4 g Oral TID WC  . simethicone  80 mg Oral TID       LABS:   CBC Latest Ref Rng & Units 02/11/2019 02/10/2019 02/09/2019  WBC 4.0 - 10.5 K/uL 6.2 6.6 7.7  Hemoglobin 13.0 - 17.0 g/dL 7.4(L) 7.7(L) 7.6(L)  Hematocrit 39.0 - 52.0 % 24.1(L) 24.2(L) 23.3(L)  Platelets 150 - 400 K/uL 178 185 PLATELET CLUMPS NOTED ON SMEAR, UNABLE TO ESTIMATE    CMP Latest Ref Rng & Units 02/11/2019 02/10/2019 02/09/2019  Glucose 70 - 99 mg/dL 125(H) 109(H) 100(H)  BUN 8 - 23 mg/dL 60(H) 40(H) 77(H)  Creatinine 0.61 - 1.24 mg/dL 5.93(H) 4.28(H) 6.82(H)  Sodium 135 - 145 mmol/L 137  136 136  Potassium 3.5 - 5.1 mmol/L 4.5 4.3 5.2(H)  Chloride 98 - 111 mmol/L 98 97(L) 100  CO2 22 - 32 mmol/L 26 26 23   Calcium 8.9 - 10.3 mg/dL 9.2 9.1 8.9  Total Protein 6.5 - 8.1 g/dL - - 5.6(L)  Total Bilirubin 0.3 - 1.2 mg/dL - - 0.9  Alkaline Phos 38 - 126 U/L - - 66  AST 15 - 41 U/L - - 30  ALT 0 - 44 U/L - - 17    Lab Results  Component Value Date   PTH 40 02/02/2019   PTH Comment 02/02/2019   CALCIUM 9.2 02/11/2019   PHOS 4.4 02/11/2019       Component Value Date/Time   COLORURINE YELLOW 01/17/2019 1029   APPEARANCEUR HAZY (A) 01/17/2019 1029   LABSPEC 1.012 01/17/2019 1029   PHURINE 5.0 01/17/2019 [place] 01/17/2019 [place] 01/17/2019 [place] 01/17/2019 1029   KETONESUR 5 (A) 01/17/2019 [place] 01/17/2019 1029   NITRITE NEGATIVE 01/17/2019 [place] 01/17/2019 1029  Component Value Date/Time   PHART 7.414 01/22/2019 0839   PCO2ART 36.8 01/22/2019 0839   PO2ART 104 01/22/2019 0839   HCO3 23.1 01/22/2019 0839   ACIDBASEDEF 0.8 01/22/2019 0839   O2SAT 97.8 01/22/2019 0839       Component Value Date/Time   IRON 41 (L) 01/30/2019 0308   TIBC 305 01/30/2019 0308   FERRITIN 724 (H) 01/30/2019 0308   IRONPCTSAT 13 (L) 01/30/2019 0308       ASSESSMENT/PLAN:    78 year old male patient who presented with pneumonia complicated by acute kidney injury requiring renal replacement therapy.  He is dialyzing via a tunneled right IJ catheter.  He had subsequent respiratory failure requiring intubation, and acute blood loss anemia from upper GI bleeding.  He also had a right upper extremity DVT requiring anticoagulation on apixiban - hold heparin with HD   1.  Acute kidney injury which was felt to have progressed to ESRD.  Appears CRRT started 6/3 and iHD started 6/11.   - HD today per TTS schedule - HD today.  Check labs pre HD - He is dialyzing via a right IJ tunnel catheter.   He has a left brachiocephalic AV fistula in place.   - Given advanced underlying renal failure, he was felt to have progressed to ESRD. He has a outpatient spot for TTS at [facility] at 12:15  - he has received midodrine to support BP. Currently ordered at 10 mg TID    2.  Pneumonia.  Resolved.  3.  Plasma cell myeloma.  Per oncology.  4.  Right upper extremity DVT.  On eliquis per primary team   5. Anemia 2/2 CKD hgb 7.4 trending down - transfuse prn - Aranesp 200 scheduled 6/30- last 6/23  - tsat 13% 6/13  Ferritin 724 - last feraheme 510 6/15 - will given ferrilicit 426 x 4 more doses  - repeat labs pre HD  6. He has had episodic SVT for which primary team initiated amio  7. BP/volume - continue to titrate volume down as BP allows  8. Nutrition - nepro and vits to supplement diet  9. Deconditioning - CIR admission

## 2019-02-13 NOTE — Progress Notes (Signed)
Occupational Therapy Session Note  Patient Details  Name: Scott Mcdowell MRN: [id number] Date of Birth: April 17, 1941  Today's Date: 02/13/2019 OT Individual Time: 1400-1440 OT Individual Time Calculation (min): 40 min    Short Term Goals: Week 1:  OT Short Term Goal 1 (Week 1): Pt will complete bathing with mod assist at sit > stand level OT Short Term Goal 2 (Week 1): Pt will complete LB dressing at max assist level OT Short Term Goal 3 (Week 1): Pt will complete toilet transfer max assist stand pivot OT Short Term Goal 4 (Week 1): Pt will complete 1 grooming task in standing for increased standing tolerance  Skilled Therapeutic Interventions/Progress Updates:    1;1. Pt received in bed initially declining tx d/t needing to remain in bed for dialysis, however after encouragement and option to complete bed level activity and exercise pt willing to participate. Pt completes RUE therex 2x10 shoulder flex/ext, chest press/row, elbow flex/ext with manual resistance for RUE NMR. PT completes RLE therex with maxi slide under RLE 2x10 ab/adduct, ankle pumps, and hip flex/ext with min A for full ROM. Pt requires prolonged rest breaks d/t fatigue. Exited session with pt seated in bed, exit alarm on and call light in reach  Therapy Documentation Precautions:  Precautions Precautions: Fall Precaution Comments: R hemiparesis, h/o 1 fall in past 1 year; hemiparesis more apparent due to debility Restrictions Weight Bearing Restrictions: No General:   Vital Signs:   Pain:   ADL: ADL Grooming: Minimal assistance Where Assessed-Grooming: Edge of bed Upper Body Bathing: Minimal assistance Where Assessed-Upper Body Bathing: Edge of bed Lower Body Bathing: Maximal assistance Where Assessed-Lower Body Bathing: Edge of bed Upper Body Dressing: Moderate assistance Where Assessed-Upper Body Dressing: Edge of bed Lower Body Dressing: Dependent(+2) Where Assessed-Lower Body Dressing: Edge of  bed Toilet Transfer: Maximal assistance(+2) Toilet Transfer Method: Charlaine Dalton) Toilet Transfer Equipment: Art gallery manager    Praxis   Exercises:   Other Treatments:     Therapy/Group: Individual Therapy  [name] 02/13/2019, 2:48 PM

## 2019-02-13 NOTE — Progress Notes (Signed)
Occupational Therapy Session Note  Patient Details  Name: Scott Mcdowell MRN: [id number] Date of Birth: 04/19/1941  Today's Date: 02/13/2019 OT Individual Time: 1000-1100 OT Individual Time Calculation (min): 60 min    Short Term Goals: Week 1:  OT Short Term Goal 1 (Week 1): Pt will complete bathing with mod assist at sit > stand level OT Short Term Goal 2 (Week 1): Pt will complete LB dressing at max assist level OT Short Term Goal 3 (Week 1): Pt will complete toilet transfer max assist stand pivot OT Short Term Goal 4 (Week 1): Pt will complete 1 grooming task in standing for increased standing tolerance  Skilled Therapeutic Interventions/Progress Updates:    1:1 Pt up in the w/c when arrived and reported he was pleased with his progress and his BP in earlier PT session. Attempted to have pt self propel w/c but required max to total A to perform. Taken to [employer] and perform mod A slide board (short board) transfer to the Nustep. Performed 15 min on Nustep at a resistance level of 4 with 3 prolonged rest breaks. Pt required A to maintain right on handle- requiring glove.  Pt exerting endurance and demosntrating improved cardiopulmonary endurance today. Slide board transfer back to bed with mod A after total A to setup pt. Mod A to return to supine. Left resting in bed.   Therapy Documentation Precautions:  Precautions Precautions: Fall Precaution Comments: R hemiparesis, h/o 1 fall in past 1 year; hemiparesis more apparent due to debility Restrictions Weight Bearing Restrictions: No Pain: No c/o pain in session   Therapy/Group: Individual Therapy  Willeen Cass [facility] 02/13/2019, 11:00 AM

## 2019-02-13 NOTE — Progress Notes (Signed)
Physical Therapy Session Note  Patient Details  Name: Scott Mcdowell MRN: [id number] Date of Birth: 1940-11-21  Today's Date: 02/13/2019 PT Individual Time: 0800-0916 PT Individual Time Calculation (min): 76 min   Short Term Goals: Week 1:  PT Short Term Goal 1 (Week 1): pt will move supine> sit wiht min assist PT Short Term Goal 2 (Week 1): pt will move sit> supine with mod/max assist PT Short Term Goal 3 (Week 1): pt will transfer bed>< w/c wiht assistance of 1 person PT Short Term Goal 4 (Week 1): pt will initiate gait training  Skilled Therapeutic Interventions/Progress Updates:   Pt received supine in bed and agreeable to therapy session. Supine R LE heel slides and bridging with R LE bias, attempted SLR but pt compensated with adductors rather than use of hip flexors and quadriceps - assist provided throughout for increased ROM and cuing for proper form/technique. Rolling R/L using bedrails with min A rolling R and mod A rolling L due to impaired R HB strength for therapist to done LB clothing max assist for time management. Supine>sit with mod assist for R LE management and trunk upright. Sit>stand from EOB>stedy with max assist of 2 for lifting. Transported to bathroom in stedy. Sit<>stand in stedy from stedy seat with mod assist of 1 throughout session. Stand>sit stedy>BSC over toilet with mod A of 2 for lifting/lowering. Continent of BM. Total assist LB clothing management and peri-care. Sit>stand BSC over toilet>stedy requiring multiple attempts of max assist of 2 prior to achieving standing. Transported to sink in stedy. Sit<>stand in stedy at mirror with mod assist of 1 for lifting/loweirng. Performed 4 bouts of R/L lateral weightshifting in stedy with manual facilitation for weightshifting and multimodal cuing for R LE quad and glute activaiton. Pt left sitting in w/c with needs in reach and seat belt alarm on.  Therapy Documentation Precautions:  Precautions Precautions:  Fall Precaution Comments: R hemiparesis, h/o 1 fall in past 1 year; hemiparesis more apparent due to debility Restrictions Weight Bearing Restrictions: No  Pain:  No reports of pain during session.   Therapy/Group: Individual Therapy  Tawana Scale, PT, DPT 02/13/2019, 7:48 AM

## 2019-02-13 NOTE — Progress Notes (Signed)
[facility] PHYSICAL MEDICINE & REHABILITATION PROGRESS NOTE  Subjective/Complaints: C/o of gas over the last 24 hours or so. Had a fair night  ROS: Patient denies fever, rash, sore throat, blurred vision, nausea, vomiting, diarrhea, cough, shortness of breath or chest pain, joint or back pain, headache, or mood change.    Objective: Vital Signs: Blood pressure 104/78, pulse 86, temperature 98 F (36.7 C), temperature source Oral, resp. rate 18, height 6\' 2"  (1.88 m), weight 104.2 kg, SpO2 96 %. No results found. Recent Labs    02/11/19 1314  WBC 6.2  HGB 7.4*  HCT 24.1*  PLT 178   Recent Labs    02/11/19 1314  NA 137  K 4.5  CL 98  CO2 26  GLUCOSE 125*  BUN 60*  CREATININE 5.93*  CALCIUM 9.2    Physical Exam: BP 104/78 (BP Location: Left Leg)   Pulse 86   Temp 98 F (36.7 C) (Oral)   Resp 18   Ht 6\' 2"  (1.88 m)   Wt 104.2 kg   SpO2 96%   BMI 29.49 kg/m  Constitutional: No distress . Vital signs reviewed. HEENT: EOMI, oral membranes moist Neck: supple Cardiovascular: RRR without murmur. No JVD    Respiratory: CTA Bilaterally without wheezes or rales. Normal effort    GI: BS +, non-tender, non-distended  Musc: Generalized edema, continues to improve Neurological: Alert Follows commands Motor: Left upper extremity: 5/5 proximal distal Left lower extremity: 4-4+/5 proximal distal Right upper extremity: 3-/5 proximal distal, unchanged Right lower extremity: 2+-2-/5 proximal to distal, unchanged  Skin: Skin is warm and dry.  Sacral ulcer not examined today Psychiatric: pleasant and cooperative  Assessment/Plan: 1. Functional deficits secondary to debility which require 3+ hours per day of interdisciplinary therapy in a comprehensive inpatient rehab setting.  Physiatrist is providing close team supervision and 24 hour management of active medical problems listed below.  Physiatrist and rehab team continue to assess barriers to discharge/monitor patient  progress toward functional and medical goals  Care Tool:  Bathing    Body parts bathed by patient: Right arm, Chest, Abdomen, Right upper leg, Left upper leg, Face, Front perineal area   Body parts bathed by helper: Left arm, Buttocks, Right lower leg, Left lower leg     Bathing assist Assist Level: Moderate Assistance - Patient 50 - 74%     Upper Body Dressing/Undressing Upper body dressing   What is the patient wearing?: Pull over shirt    Upper body assist Assist Level: Moderate Assistance - Patient 50 - 74%    Lower Body Dressing/Undressing Lower body dressing    Lower body dressing activity did not occur: Refused What is the patient wearing?: Pants     Lower body assist Assist for lower body dressing: 2 Helpers     Toileting Toileting    Toileting assist Assist for toileting: Maximal Assistance - Patient 25 - 49%     Transfers Chair/bed transfer  Transfers assist     Chair/bed transfer assist level: 2 Helpers     Locomotion Ambulation   Ambulation assist   Ambulation activity did not occur: Safety/medical concerns(weakness)          Walk 10 feet activity   Assist           Walk 50 feet activity   Assist           Walk 150 feet activity   Assist Walk 150 feet activity did not occur: Safety/medical concerns(weakness)  Walk 10 feet on uneven surface  activity   Assist           Wheelchair     Assist Will patient use wheelchair at discharge?: Yes Type of Wheelchair: Manual    Wheelchair assist level: Moderate Assistance - Patient 50 - 74% Max wheelchair distance: 10    Wheelchair 50 feet with 2 turns activity    Assist    Wheelchair 50 feet with 2 turns activity did not occur: Safety/medical concerns(weakness)       Wheelchair 150 feet activity     Assist Wheelchair 150 feet activity did not occur: Safety/medical concerns(weakness)          Medical Problem List and Plan: 1.   Debility secondary to acute renal failure/ascites/multi-medical  Continue CIR PT, OT 2.  Antithrombotics:   Right brachiocephalic DVT 03/23/4626: Eliquis             -antiplatelet therapy: N/A 3. Pain Management: Hydrocodone as needed 4. Mood: Provide emotional support             -antipsychotic agents: N/A 5. Neuropsych: This patient is capable of making decisions on his own behalf. 6. Skin/Wound Care/multiple decubiti sacrum/heels.:  Prevalon boots and follow-up wound care nurse routine skin checks 7. Fluids/Electrolytes/Nutrition: Routine in and outs.   8.  GI bleed.  EGD 01/21/2019.  Follow-up gastroenterology recommendations.    Hemoglobin 7.4 on 6/26  Follow up Monday  -add simethicone for gas 9.  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  Status post left brachiocephalic AV fistula.  Hemodialysis Tuesday Thursday Saturday.  Recommendation per nephro. 10.  Plasma cell myeloma.  Outpatient follow-up with oncology to discuss formal chemotherapy.  Appreciate oncology recommendations  Per oncology, considering ordering repeat dose of Decadron  -24 hour urine sent to lab this morning  11.  Right pleural effusion.  Status post thoracentesis 01/16/2019. 12.  Orthostasis.  ProAmatine 10 mg 3 times daily.    Hypotensive, but asymptomatic on 6/27  Monitor with increased mobility 13.  Episodic SVT.  8 beats noted 02/08/2019.  Amiodarone as directed.    Stable at present  Monitor with increased mobility 13.  History of CVA with recurrent right hemiparesis. 14.  Steroid-induced hyperglycemia  Continue monitor  LOS: 3 days A FACE TO FACE EVALUATION WAS PERFORMED  Meredith Staggers 02/13/2019, 9:37 AM

## 2019-02-13 NOTE — Progress Notes (Signed)
24 hour urine collection from 6/26(0040 hrs) to 6/27 (0040) for UPEP/UIFE/Light Chain/TP completed and sent to lab.

## 2019-02-13 NOTE — Progress Notes (Signed)
Sending Iron to Hemo. Also HD site is currently bleeding again. It was not when assessed this am

## 2019-02-14 ENCOUNTER — Inpatient Hospital Stay (HOSPITAL_COMMUNITY): Payer: Medicare Other

## 2019-02-14 MED ORDER — CHLORHEXIDINE GLUCONATE CLOTH 2 % EX PADS
6.0000 | MEDICATED_PAD | Freq: Every day | CUTANEOUS | Status: DC
  Administered 2019-02-15: 6 via TOPICAL

## 2019-02-14 NOTE — Progress Notes (Addendum)
[facility]  NEPHROLOGY PROGRESS NOTE  SUBJECTIVE:  He was moved to inpatient rehab 6/24. He felt better getting O2 with HD.  Limited net UF 883 ml due to low BP even with midodrine and IV albumin Saturday.  OBJECTIVE:  Vitals:   02/13/19 1944 02/14/19 0551  BP: 98/74 131/64  Pulse: 91 78  Resp: 16 19  Temp: (!) 97.4 F (36.3 C) 97.8 F (36.6 C)  SpO2: 97% 96%    Intake/Output Summary (Last 24 hours) at 02/14/2019 8101 Last data filed at 02/14/2019 0700 Gross per 24 hour  Intake 537 ml  Output 883 ml  Net -346 ml      General:  NAD on room air CV:  RRR  Lungs:  Clear anteriorly unlabored  Abd:  obese SNT with normal BS, some flank edema Extremities: 2-3+ bilateral lower extremity edem amild arm edema on the right upper arm;  Access:  RIJ tunneled catheter -  LUE AVF with bruit and thrill ,    MEDICATIONS:  . apixaban  5 mg Oral BID  . Chlorhexidine Gluconate Cloth  6 each Topical Q0600  . [START ON 02/16/2019] darbepoetin (ARANESP) injection - DIALYSIS  200 mcg Intravenous Q Tue-HD  . dexamethasone  16 mg Oral Q12H  . feeding supplement (NEPRO CARB STEADY)  237 mL Oral TID BM  . liver oil-zinc oxide   Topical 5 X Daily  . midodrine  10 mg Oral TID WC  . multivitamin  1 tablet Oral QHS  . nystatin   Topical TID  . pantoprazole  40 mg Oral BID  . polyethylene glycol  17 g Oral Daily  . senna-docusate  1 tablet Oral QHS  . sevelamer carbonate  2.4 g Oral TID WC  . simethicone  80 mg Oral TID       LABS:   CBC Latest Ref Rng & Units 02/13/2019 02/11/2019 02/10/2019  WBC 4.0 - 10.5 K/uL 6.8 6.2 6.6  Hemoglobin 13.0 - 17.0 g/dL 7.5(L) 7.4(L) 7.7(L)  Hematocrit 39.0 - 52.0 % 24.0(L) 24.1(L) 24.2(L)  Platelets 150 - 400 K/uL 194 178 185    CMP Latest Ref Rng & Units 02/13/2019 02/11/2019 02/10/2019  Glucose 70 - 99 mg/dL 151(H) 125(H) 109(H)  BUN 8 - 23 mg/dL 80(H) 60(H) 40(H)  Creatinine 0.61 - 1.24 mg/dL 6.09(H) 5.93(H) 4.28(H)  Sodium 135 - 145  mmol/L 137 137 136  Potassium 3.5 - 5.1 mmol/L 4.7 4.5 4.3  Chloride 98 - 111 mmol/L 98 98 97(L)  CO2 22 - 32 mmol/L 24 26 26   Calcium 8.9 - 10.3 mg/dL 9.2 9.2 9.1  Total Protein 6.5 - 8.1 g/dL - - -  Total Bilirubin 0.3 - 1.2 mg/dL - - -  Alkaline Phos 38 - 126 U/L - - -  AST 15 - 41 U/L - - -  ALT 0 - 44 U/L - - -    Lab Results  Component Value Date   PTH 40 02/02/2019   PTH Comment 02/02/2019   CALCIUM 9.2 02/13/2019   PHOS 5.0 (H) 02/13/2019       Component Value Date/Time   COLORURINE YELLOW 01/17/2019 1029   APPEARANCEUR HAZY (A) 01/17/2019 1029   LABSPEC 1.012 01/17/2019 1029   PHURINE 5.0 01/17/2019 1029   GLUCOSEU NEGATIVE 01/17/2019 1029   HGBUR NEGATIVE 01/17/2019 1029   BILIRUBINUR NEGATIVE 01/17/2019 1029   KETONESUR 5 (A) 01/17/2019 1029   PROTEINUR NEGATIVE 01/17/2019 1029   NITRITE NEGATIVE 01/17/2019 1029   LEUKOCYTESUR NEGATIVE  01/17/2019 1029      Component Value Date/Time   PHART 7.414 01/22/2019 0839   PCO2ART 36.8 01/22/2019 0839   PO2ART 104 01/22/2019 0839   HCO3 23.1 01/22/2019 0839   ACIDBASEDEF 0.8 01/22/2019 0839   O2SAT 97.8 01/22/2019 0839       Component Value Date/Time   IRON 41 (L) 01/30/2019 0308   TIBC 305 01/30/2019 0308   FERRITIN 724 (H) 01/30/2019 0308   IRONPCTSAT 13 (L) 01/30/2019 0308       ASSESSMENT/PLAN:    78 year old male patient who presented with pneumonia complicated by acute kidney injury requiring renal replacement therapy.  He is dialyzing via a tunneled right IJ catheter.  He had subsequent respiratory failure requiring intubation, and acute blood loss anemia from upper GI bleeding.  He also had a right upper extremity DVT requiring anticoagulation on apixiban - hold heparin with HD   1.  Acute kidney injury which was felt to have progressed to ESRD.  Appears CRRT started 6/3 and iHD started 6/11.   - HD today per TTS schedule - HD today.  Check labs pre HD - He is dialyzing via a right IJ tunnel catheter.   He has a left brachiocephalic AV fistula in place.   - Given advanced underlying renal failure, he was felt to have progressed to ESRD. He has a outpatient spot for TTS at [facility] at 12:15  - he has received midodrine to support BP. Currently ordered at 10 mg TID    2.  Pneumonia.  Resolved.  3.  Plasma cell myeloma.  Per oncology. On decadron-   4.  Right upper extremity DVT.  On eliquis per primary team   5. Anemia 2/2 CKD hgb 7.5 stable transfuse prn - Aranesp 200 scheduled 6/30- last 6/23  - tsat 13% 6/13  Ferritin 724 - last feraheme 510 6/15 - will given ferrilicit 697 x 4 more doses   6. He has had episodic SVT for which primary team initiated amio  7. BP/volume - continue to titrate volume down as BP allows- BP limiting at this point - on midodrine for BP support . Although hgb > 7, transfusion would probably help with BP.  8. Nutrition - nepro and vits to supplement diet  9. Deconditioning - CIR admission

## 2019-02-14 NOTE — Progress Notes (Signed)
Occupational Therapy Session Note  Patient Details  Name: Scott Mcdowell MRN: [id number] Date of Birth: 1941/02/28  Today's Date: 02/14/2019 OT Individual Time: 1400-1430 OT Individual Time Calculation (min): 30 min    Short Term Goals: Week 1:  OT Short Term Goal 1 (Week 1): Pt will complete bathing with mod assist at sit > stand level OT Short Term Goal 2 (Week 1): Pt will complete LB dressing at max assist level OT Short Term Goal 3 (Week 1): Pt will complete toilet transfer max assist stand pivot OT Short Term Goal 4 (Week 1): Pt will complete 1 grooming task in standing for increased standing tolerance  Skilled Therapeutic Interventions/Progress Updates:    Pt received supine in bed with no c/o pain. Pt transitioned to sitting EOB with min A. He maintained sitting balance while in posterior pelvic tilt to compensate for trunk control. Pt completed BUE gravity eliminated horizontal abd/adduction exercises with a focus on scapular activation during protraction and retraction. Tactile cues provided to increase activation. Pt completed bimanual towel folding task with mod manual facilitation required. Pt returned to supine and PROM provided to R LE, as requested by pt. Pt left with all needs within reach.   Therapy Documentation Precautions:  Precautions Precautions: Fall Precaution Comments: R hemiparesis, h/o 1 fall in past 1 year; hemiparesis more apparent due to debility Restrictions Weight Bearing Restrictions: No   Therapy/Group: Individual Therapy  Curtis Sites 02/14/2019, 7:29 AM

## 2019-02-14 NOTE — Progress Notes (Signed)
[facility] PHYSICAL MEDICINE & REHABILITATION PROGRESS NOTE  Subjective/Complaints: Gas much better this morning. Feeling well  ROS: Patient denies fever, rash, sore throat, blurred vision, nausea, vomiting, diarrhea, cough, shortness of breath or chest pain, joint or back pain, headache, or mood change.   Objective: Vital Signs: Blood pressure 131/64, pulse 78, temperature 97.8 F (36.6 C), resp. rate 19, height 6\' 2"  (1.88 m), weight 107 kg, SpO2 96 %. No results found. Recent Labs    02/11/19 1314 02/13/19 1621  WBC 6.2 6.8  HGB 7.4* 7.5*  HCT 24.1* 24.0*  PLT 178 194   Recent Labs    02/11/19 1314 02/13/19 1621  NA 137 137  K 4.5 4.7  CL 98 98  CO2 26 24  GLUCOSE 125* 151*  BUN 60* 80*  CREATININE 5.93* 6.09*  CALCIUM 9.2 9.2    Physical Exam: BP 131/64 (BP Location: Left Leg)   Pulse 78   Temp 97.8 F (36.6 C)   Resp 19   Ht 6\' 2"  (1.88 m)   Wt 107 kg   SpO2 96%   BMI 30.29 kg/m  Constitutional: No distress . Vital signs reviewed. HEENT: EOMI, oral membranes moist Neck: supple Cardiovascular: RRR without murmur. No JVD    Respiratory: CTA Bilaterally without wheezes or rales. Normal effort    GI: BS +, non-tender, non-distended  Musc: Generalized edema, improving Neurological: Alert Follows commands Motor: Left upper extremity: 5/5 proximal distal Left lower extremity: 4-4+/5 proximal distal Right upper extremity: 3-/5 proximal distal, unchanged Right lower extremity: 2+-2-/5 proximal to distal, unchanged  Skin: Skin is warm and dry.  Sacral ulcer not examined Psychiatric: pleasant and cooperative  Assessment/Plan: 1. Functional deficits secondary to debility which require 3+ hours per day of interdisciplinary therapy in a comprehensive inpatient rehab setting.  Physiatrist is providing close team supervision and 24 hour management of active medical problems listed below.  Physiatrist and rehab team continue to assess barriers to  discharge/monitor patient progress toward functional and medical goals  Care Tool:  Bathing    Body parts bathed by patient: Right arm, Chest, Abdomen, Right upper leg, Left upper leg, Face, Front perineal area   Body parts bathed by helper: Left arm, Buttocks, Right lower leg, Left lower leg     Bathing assist Assist Level: Moderate Assistance - Patient 50 - 74%     Upper Body Dressing/Undressing Upper body dressing   What is the patient wearing?: Hospital gown only(hospital scurb)    Upper body assist Assist Level: Moderate Assistance - Patient 50 - 74%    Lower Body Dressing/Undressing Lower body dressing    Lower body dressing activity did not occur: Refused What is the patient wearing?: Hospital gown only, Incontinence brief(hospital scrub)     Lower body assist Assist for lower body dressing: 2 Helpers     Toileting Toileting    Toileting assist Assist for toileting: Maximal Assistance - Patient 25 - 49%     Transfers Chair/bed transfer  Transfers assist     Chair/bed transfer assist level: Dependent - mechanical lift(stedy)     Locomotion Ambulation   Ambulation assist   Ambulation activity did not occur: Safety/medical concerns(weakness)          Walk 10 feet activity   Assist           Walk 50 feet activity   Assist           Walk 150 feet activity   Assist Walk 150 feet activity  did not occur: Safety/medical concerns(weakness)         Walk 10 feet on uneven surface  activity   Assist           Wheelchair     Assist Will patient use wheelchair at discharge?: Yes Type of Wheelchair: Manual    Wheelchair assist level: Moderate Assistance - Patient 50 - 74% Max wheelchair distance: 10    Wheelchair 50 feet with 2 turns activity    Assist    Wheelchair 50 feet with 2 turns activity did not occur: Safety/medical concerns(weakness)       Wheelchair 150 feet activity     Assist Wheelchair 150  feet activity did not occur: Safety/medical concerns(weakness)          Medical Problem List and Plan: 1.  Debility secondary to acute renal failure/ascites/multi-medical  Continue CIR PT, OT 2.  Antithrombotics:   Right brachiocephalic DVT 2/0/9470: Eliquis             -antiplatelet therapy: N/A 3. Pain Management: Hydrocodone as needed 4. Mood: Provide emotional support             -antipsychotic agents: N/A 5. Neuropsych: This patient is capable of making decisions on his own behalf. 6. Skin/Wound Care/multiple decubiti sacrum/heels.:  Prevalon boots and follow-up wound care nurse routine skin checks 7. Fluids/Electrolytes/Nutrition: Routine in and outs.   8.  GI bleed.  EGD 01/21/2019.  Follow-up gastroenterology recommendations.    Hemoglobin 7.5 on 6/27  Follow up Monday  -added simethicone for gas with good results 9.  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  Status post left brachiocephalic AV fistula.  Hemodialysis Tuesday Thursday Saturday.  Recommendation per nephro. 10.  Plasma cell myeloma.  Outpatient follow-up with oncology to discuss formal chemotherapy.  Appreciate oncology recommendations  Per oncology, considering ordering repeat dose of Decadron  -24 hour urine sent out Saturday  11.  Right pleural effusion.  Status post thoracentesis 01/16/2019. 12.  Orthostasis.  ProAmatine 10 mg 3 times daily.    Hypotensive, but asymptomatic on 6/27  Monitor with increased mobility 13.  Episodic SVT.  8 beats noted 02/08/2019.  Amiodarone as directed.    Stable at present  Monitor with increased mobility 13.  History of CVA with recurrent right hemiparesis. 14.  Steroid-induced hyperglycemia  Continue monitor  LOS: 4 days A FACE TO FACE EVALUATION WAS PERFORMED  Meredith Staggers 02/14/2019, 9:22 AM

## 2019-02-15 ENCOUNTER — Inpatient Hospital Stay (HOSPITAL_COMMUNITY): Payer: Medicare Other

## 2019-02-15 ENCOUNTER — Inpatient Hospital Stay (HOSPITAL_COMMUNITY): Payer: Medicare Other | Admitting: Occupational Therapy

## 2019-02-15 LAB — RENAL FUNCTION PANEL
Albumin: 3.7 g/dL (ref 3.5–5.0)
Anion gap: 12 (ref 5–15)
BUN: 96 mg/dL — ABNORMAL HIGH (ref 8–23)
CO2: 26 mmol/L (ref 22–32)
Calcium: 8.9 mg/dL (ref 8.9–10.3)
Chloride: 100 mmol/L (ref 98–111)
Creatinine, Ser: 6.21 mg/dL — ABNORMAL HIGH (ref 0.61–1.24)
GFR calc Af Amer: 9 mL/min — ABNORMAL LOW (ref >60)
GFR calc non Af Amer: 8 mL/min — ABNORMAL LOW (ref >60)
Glucose, Bld: 119 mg/dL — ABNORMAL HIGH (ref 70–99)
Phosphorus: 4.6 mg/dL (ref 2.5–4.6)
Potassium: 4.5 mmol/L (ref 3.5–5.1)
Sodium: 138 mmol/L (ref 135–145)

## 2019-02-15 LAB — UPEP/UIFE/LIGHT CHAINS/TP, 24-HR UR
% BETA, Urine: 7.2 %
ALPHA 1 URINE: 3.7 %
Albumin, U: 74 %
Alpha 2, Urine: 6.4 %
Free Kappa Lt Chains,Ur: 22.6 mg/L (ref 0.63–113.79)
Free Kappa/Lambda Ratio: 0.04 — ABNORMAL LOW (ref 1.03–31.76)
Free Lambda Lt Chains,Ur: 622.82 mg/L — ABNORMAL HIGH (ref 0.47–11.77)
GAMMA GLOBULIN URINE: 8.8 %
M-SPIKE %, Urine: 2.9 % — ABNORMAL HIGH
M-Spike, Mg/24 Hr: 21 mg/24 hr — ABNORMAL HIGH
Total Protein, Urine-Ur/day: 719 mg/24 hr — ABNORMAL HIGH (ref 30–150)
Total Protein, Urine: 359.3 mg/dL
Total Volume: 200

## 2019-02-15 LAB — CBC
HCT: 25.1 % — ABNORMAL LOW (ref 39.0–52.0)
Hemoglobin: 7.9 g/dL — ABNORMAL LOW (ref 13.0–17.0)
MCH: 32.8 pg (ref 26.0–34.0)
MCHC: 31.5 g/dL (ref 30.0–36.0)
MCV: 104.1 fL — ABNORMAL HIGH (ref 80.0–100.0)
Platelets: 212 10*3/uL (ref 150–400)
RBC: 2.41 MIL/uL — ABNORMAL LOW (ref 4.22–5.81)
RDW: 19.1 % — ABNORMAL HIGH (ref 11.5–15.5)
WBC: 6.7 10*3/uL (ref 4.0–10.5)
nRBC: 0.3 % — ABNORMAL HIGH (ref 0.0–0.2)

## 2019-02-15 LAB — PREPARE RBC (CROSSMATCH)

## 2019-02-15 MED ORDER — PENTAFLUOROPROP-TETRAFLUOROETH EX AERO: 1.0000 "application " | INHALATION_SPRAY | CUTANEOUS | Status: DC | PRN

## 2019-02-15 MED ORDER — SODIUM CHLORIDE 0.9 % IV SOLN: 100.0000 mL | INTRAVENOUS | Status: DC | PRN

## 2019-02-15 MED ORDER — CHLORHEXIDINE GLUCONATE CLOTH 2 % EX PADS
6.0000 | MEDICATED_PAD | Freq: Every day | CUTANEOUS | Status: DC
  Administered 2019-02-17: 06:00:00 6 via TOPICAL

## 2019-02-15 MED ORDER — LIDOCAINE HCL (PF) 1 % IJ SOLN: 5.0000 mL | INTRAMUSCULAR | Status: DC | PRN

## 2019-02-15 MED ORDER — SODIUM CHLORIDE 0.9% IV SOLUTION: Freq: Once | INTRAVENOUS | Status: DC

## 2019-02-15 MED ORDER — HEPARIN SODIUM (PORCINE) 1000 UNIT/ML IJ SOLN
INTRAMUSCULAR | Status: AC
  Filled 2019-02-15: qty 1

## 2019-02-15 MED ORDER — ALTEPLASE 2 MG IJ SOLR: 2.0000 mg | Freq: Once | INTRAMUSCULAR | Status: DC | PRN

## 2019-02-15 MED ORDER — LIDOCAINE-PRILOCAINE 2.5-2.5 % EX CREA: 1.0000 "application " | TOPICAL_CREAM | CUTANEOUS | Status: DC | PRN

## 2019-02-15 MED ORDER — HEPARIN SODIUM (PORCINE) 1000 UNIT/ML DIALYSIS
1000.0000 [IU] | INTRAMUSCULAR | Status: DC | PRN
  Administered 2019-02-16: 18:00:00 3200 [IU] via INTRAVENOUS_CENTRAL

## 2019-02-15 NOTE — Progress Notes (Signed)
Occupational Therapy Session Note  Patient Details  Name: Scott Mcdowell MRN: [id number] Date of Birth: 05/27/1941  Today's Date: 02/15/2019 OT Individual Time: 8295-6213 OT Individual Time Calculation (min): 47 min    Short Term Goals: Week 1:  OT Short Term Goal 1 (Week 1): Pt will complete bathing with mod assist at sit > stand level OT Short Term Goal 2 (Week 1): Pt will complete LB dressing at max assist level OT Short Term Goal 3 (Week 1): Pt will complete toilet transfer max assist stand pivot OT Short Term Goal 4 (Week 1): Pt will complete 1 grooming task in standing for increased standing tolerance  Skilled Therapeutic Interventions/Progress Updates:    Patient seated in w/c, ready for therapy session.  He is pleasant and cooperative.  Sit pivot transfer to/from w/c and mat table with mod A of one and stand by of 2nd person.  Sit to stand with mod A of one and CGA of 2nd person - able to maintain standing for 15-20 seconds.  Unsupported sitting on mat with CS and cues of upright posture for 20 minutes with various UB and core conditioning activities, AAROM of R UE, weight bearing and trunk stretches.  W/c adjusted out of hemi height position.  Patient remained in w/c at close of session with seat belt alarm set, tray table with lunch tray set up/food cut, phone and call bell in reach.    Therapy Documentation Precautions:  Precautions Precautions: Fall Precaution Comments: R hemiparesis, h/o 1 fall in past 1 year; hemiparesis more apparent due to debility Restrictions Weight Bearing Restrictions: No General:   Vital Signs: Therapy Vitals Temp: (!) 97.3 F (36.3 C) Pulse Rate: 79 Resp: 17 BP: (!) 107/59 Patient Position (if appropriate): Sitting Oxygen Therapy SpO2: 98 % O2 Device: Room Air Pain: Pain Assessment Pain Scale: 0-10 Pain Score: 0-No pain   Other Treatments:     Therapy/Group: Individual Therapy  Carlos Levering 02/15/2019, 3:38 PM

## 2019-02-15 NOTE — Plan of Care (Signed)
  Problem: Consults Goal: RH GENERAL PATIENT EDUCATION Description: See Patient Education module for education specifics. Outcome: Progressing Goal: Skin Care Protocol Initiated - if Braden Score 18 or less Description: If consults are not indicated, leave blank or document N/A Outcome: Progressing   Problem: RH BOWEL ELIMINATION Goal: RH STG MANAGE BOWEL WITH ASSISTANCE Description: STG Manage Bowel with Assistance. Min Outcome: Progressing Goal: RH STG MANAGE BOWEL W/MEDICATION W/ASSISTANCE Description: STG Manage Bowel with Medication with Assistance. Min Outcome: Progressing   Problem: RH SKIN INTEGRITY Goal: RH STG SKIN FREE OF INFECTION/BREAKDOWN Description: Skin free of infection & further breakdown. Mod assistance. Outcome: Progressing Goal: RH STG MAINTAIN SKIN INTEGRITY WITH ASSISTANCE Description: STG Maintain Skin Integrity With Assistance. Mod assist. Outcome: Progressing   Problem: RH SAFETY Goal: RH STG ADHERE TO SAFETY PRECAUTIONS W/ASSISTANCE/DEVICE Description: STG Adhere to Safety Precautions With Assistance/Device. Mod assist. Outcome: Progressing   Problem: RH PAIN MANAGEMENT Goal: RH STG PAIN MANAGED AT OR BELOW PT'S PAIN GOAL Description: Pain level <3. Outcome: Progressing

## 2019-02-15 NOTE — Progress Notes (Signed)
Physical Therapy Session Note  Patient Details  Name: Scott Mcdowell MRN: [id number] Date of Birth: 1941/06/14  Today's Date: 02/15/2019 PT Individual Time: [phone] PT Individual Time Calculation (min): 73 min   Short Term Goals: Week 1:  PT Short Term Goal 1 (Week 1): pt will move supine> sit wiht min assist PT Short Term Goal 2 (Week 1): pt will move sit> supine with mod/max assist PT Short Term Goal 3 (Week 1): pt will transfer bed>< w/c wiht assistance of 1 person PT Short Term Goal 4 (Week 1): pt will initiate gait training     Skilled Therapeutic Interventions/Progress Updates:   Pt sitting in w/c.  Pt concerned as HD staff saw him in the room recently and stated that he was coming to HD today, although he is scheduled for T,TH,Sat. He did not want to participate initially.  PT confirmed with Mekides, RN that pt will have HD today.  After explanation, pt willing to participate in bed level tx.  Pt stated that he thought he was soiled and needed to be changed.  Slide board transfer to R w/c> bed, slightly downhill, with mod assist.  Pt unable to elevate hips.  Max assist sit> supine.  Using bed features, pt slid toward HOB.  He bridged up after set- up of RLE for doffing pants .  Rolling R with supervision and bed rail, and L with min assist after set-up, for doffing brief, peri care and donning fresh brief.  Pt asked for frontal peri care, which he performed with set -up in supine.  PT reapplied antifungal powder.  In hook lying, neuromuscular re-education via multimodal cues: with strap around thighs, 10 x 1 lower trunk rotation, L straight leg raises, 5 x 2 bil bridging with PT stabilizing RLE. In supine:  10 x 1 R active assistive straight leg raises, R resisted plantar flexion, R assisted DF, 2 x 10 cervical flexion. Pt DOE requiring cues to slow down breathing.  Cues to count aloud during exs to avoid Valsalva maneuver.   PT placed pillows under RUE and hand to  elevate and address edema.   Transporter arrived to take pt to HD.     Therapy Documentation Precautions:  Precautions Precautions: Fall Precaution Comments: R hemiparesis, h/o 1 fall in past 1 year; hemiparesis more apparent due to debility Restrictions Weight Bearing Restrictions: No General:    Pain: pt denies Pain Assessment Pain Scale: 0-10 Pain Score: 0-No pain      Therapy/Group: Individual Therapy  Sandip Power 02/15/2019, 4:10 PM

## 2019-02-15 NOTE — Progress Notes (Signed)
[facility] NEPHROLOGY PROGRESS NOTE  Assessment/ Plan: Pt is a 78 y.o. yo male with myeloma presented with pneumonia complicated by AKI requiring dialysis.  Patient had respiratory failure requiring intubation, acute blood loss anemia and upper GI bleed.  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  He has TDC and AV fistula.  Plan for extra 2 hours of treatment today for ultrafiltration and regular dialysis tomorrow. -He has right IJ TDC and left brachiocephalic AV fistula.  # Anemia due to CKD: Received Feraheme on 6/15 and then 4 doses of Ferrlecit started on 6/27.  Continue ESA.  Monitor hemoglobin.  # Secondary hyperparathyroidism: Phosphorus level 5. PTH 40.  Continue Renvela.  # Hypotension/volume: On midodrine before dialysis.  #Right upper extremity DVT: On Eliquis per primary team.  #Plasma cell myeloma per oncology, on a steroid.   Subjective: Seen and examined at rehab.  Plan for UF treatment today.  He has leg swelling and stable shortness of breath.  No chest pain, nausea or vomiting. Objective Vital signs in last 24 hours: Vitals:   02/14/19 1316 02/14/19 2022 02/15/19 0536 02/15/19 1429  BP: 130/60 117/82 123/65 (!) 107/59  Pulse: 87 84 82 79  Resp: 17 20 17 17   Temp: 98.4 F (36.9 C) 97.7 F (36.5 C) 97.9 F (36.6 C) (!) 97.3 F (36.3 C)  TempSrc: Oral Oral Oral   SpO2: 98% 97% 97% 98%  Weight:      Height:       Weight change:   Intake/Output Summary (Last 24 hours) at 02/15/2019 1509 Last data filed at 02/15/2019 1400 Gross per 24 hour  Intake 800 ml  Output 101 ml  Net 699 ml       Labs: Basic Metabolic Panel: Recent Labs  Lab 02/10/19 0542 02/11/19 1314 02/13/19 1621  NA 136 137 137  K 4.3 4.5 4.7  CL 97* 98 98  CO2 26 26 24   GLUCOSE 109* 125* 151*  BUN 40* 60* 80*  CREATININE 4.28* 5.93* 6.09*  CALCIUM 9.1 9.2 9.2  PHOS 3.4 4.4 5.0*   Liver Function Tests: Recent Labs  Lab  02/09/19 0601 02/10/19 0542 02/11/19 1314 02/13/19 1621  AST 30  --   --   --   ALT 17  --   --   --   ALKPHOS 66  --   --   --   BILITOT 0.9  --   --   --   PROT 5.6*  --   --   --   ALBUMIN 3.3*  3.3* 3.3* 3.4* 3.8   No results for input(s): LIPASE, AMYLASE in the last 168 hours. No results for input(s): AMMONIA in the last 168 hours. CBC: Recent Labs  Lab 02/09/19 0601 02/10/19 0542 02/11/19 1314 02/13/19 1621  WBC 7.7 6.6 6.2 6.8  NEUTROABS 5.5 4.6  --   --   HGB 7.6* 7.7* 7.4* 7.5*  HCT 23.3* 24.2* 24.1* 24.0*  MCV 100.0 103.0* 104.3* 104.3*  PLT PLATELET CLUMPS NOTED ON SMEAR, UNABLE TO ESTIMATE 185 178 194   Cardiac Enzymes: No results for input(s): CKTOTAL, CKMB, CKMBINDEX, TROPONINI in the last 168 hours. CBG: Recent Labs  Lab 02/11/19 1151 02/11/19 1738 02/12/19 0614  GLUCAP 109* 136* 117*    Iron Studies: No results for input(s): IRON, TIBC, TRANSFERRIN, FERRITIN in the last 72 hours. Studies/Results: No results found.  Medications: Infusions: . ferric gluconate (FERRLECIT/NULECIT) IV 125 mg (02/13/19 1749)    Scheduled  Medications: . apixaban  5 mg Oral BID  . Chlorhexidine Gluconate Cloth  6 each Topical Q0600  . [START ON 02/16/2019] darbepoetin (ARANESP) injection - DIALYSIS  200 mcg Intravenous Q Tue-HD  . feeding supplement (NEPRO CARB STEADY)  237 mL Oral TID BM  . liver oil-zinc oxide   Topical 5 X Daily  . midodrine  10 mg Oral TID WC  . multivitamin  1 tablet Oral QHS  . nystatin   Topical TID  . pantoprazole  40 mg Oral BID  . polyethylene glycol  17 g Oral Daily  . senna-docusate  1 tablet Oral QHS  . sevelamer carbonate  2.4 g Oral TID WC  . simethicone  80 mg Oral TID    have reviewed scheduled and prn medications.  Physical Exam: General:NAD, comfortable Heart:RRR, s1s2 nl Lungs:clear b/l, no crackle Abdomen:soft, Non-tender, non-distended Extremities: Lower extremity pitting edema and right upper arm edema. Dialysis  Access: Right IJ TDC and left upper extremity AV fistula with bruit and thrill.   Prasad  02/15/2019,3:09 PM  LOS: 5 days  Pager: [phone]

## 2019-02-15 NOTE — Progress Notes (Signed)
Occupational Therapy Session Note  Patient Details  Name: Scott Mcdowell MRN: [id number] Date of Birth: 19-Apr-1941  Today's Date: 02/15/2019 OT Individual Time: 7124-5809 OT Individual Time Calculation (min): 75 min    Short Term Goals: Week 1:  OT Short Term Goal 1 (Week 1): Pt will complete bathing with mod assist at sit > stand level OT Short Term Goal 2 (Week 1): Pt will complete LB dressing at max assist level OT Short Term Goal 3 (Week 1): Pt will complete toilet transfer max assist stand pivot OT Short Term Goal 4 (Week 1): Pt will complete 1 grooming task in standing for increased standing tolerance     Skilled Therapeutic Interventions/Progress Updates:    Pt received in bed and agreeable to therapy.  He declined a bath as he stated his was cleaned up by nursing earlier.  Worked on bed mobility skills by rolling onto L side side with mod A and cues for RUE management. Once on his L side, pt was able to push up using L arm with only min A.  He needed to sit for a few minutes to avoid dizziness.    Worked on sit to stand from elevated bed with  +2 max A.  With cues to contract his glutes he was able to rise taller but could only hold for 10 seconds at a time with 3 trials. Completed sq pivot to w.c to L side with max A of 2.   Pt brushed teeth at sink with set up.  Pt taken to gym and worked on sq pivot to therapy mat. In this transfer and one in his room he had almost no hip clearance and basically slid over.  So focused treatment on forward lean, head/hip orientation, pushing through BUE and BLE, lifting hips slightly and scooting to L 4x, R 4x for 3 sets.  Pt able to use RUE to push up with cues to reposition hand each scoot.   Worked on sit to stand 3x with max A of 2, with very limited standing endurance of 10 sec.  Pt had great difficulty coming upright but was able to 1x.  RUE motor control using dowel bar focusing on push pull with elbow, rotation with forearm and wrist,  grasp and squeeze and then finger extension with a/arom. Pt able to extend fingers 40% of AROM.  Squat pivot back to w.c to L side with max of 2 with improved hip clearance.  Pt taken back to room. Resting in w/c with lap tray and belt alarm on and all needs met.   Therapy Documentation Precautions:  Precautions Precautions: Fall Precaution Comments: R hemiparesis, h/o 1 fall in past 1 year; hemiparesis more apparent due to debility Restrictions Weight Bearing Restrictions: No       Pain: Pain Assessment Pain Scale: 0-10 Pain Score: 0-No pain    Therapy/Group: Individual Therapy  [place] 02/15/2019, 11:11 AM

## 2019-02-15 NOTE — Progress Notes (Signed)
PHYSICAL MEDICINE & REHABILITATION PROGRESS NOTE  Subjective/Complaints: Patient seen sitting up in bed this morning.  He states he slept well overnight.  He states he had a good weekend.  He notes improvement.  ROS: Denies CP, SOB, N/V/D  Objective: Vital Signs: Blood pressure 123/65, pulse 82, temperature 97.9 F (36.6 C), temperature source Oral, resp. rate 17, height 6\' 2"  (1.88 m), weight 107 kg, SpO2 97 %. No results found. Recent Labs    02/13/19 1621  WBC 6.8  HGB 7.5*  HCT 24.0*  PLT 194   Recent Labs    02/13/19 1621  NA 137  K 4.7  CL 98  CO2 24  GLUCOSE 151*  BUN 80*  CREATININE 6.09*  CALCIUM 9.2    Physical Exam: BP 123/65 (BP Location: Left Leg)   Pulse 82   Temp 97.9 F (36.6 C) (Oral)   Resp 17   Ht 6\' 2"  (1.88 m)   Wt 107 kg   SpO2 97%   BMI 30.29 kg/m  Constitutional: No distress . Vital signs reviewed. HENT: Normocephalic.  Atraumatic. Eyes: EOMI. No discharge. Cardiovascular: No JVD. Respiratory: Normal effort. GI: Non-distended. Musc: No edema or tenderness in extremities. Musc: Generalized edema, continues to improve Neurological: Alert  Follows commands Motor:  Right upper extremity: 3/5 proximal distal Right lower extremity: 3--3/5 proximal to distal Skin: Skin is warm and dry.  Sacral ulcers not examined today Psychiatric: pleasant and cooperative  Assessment/Plan: 1. Functional deficits secondary to debility which require 3+ hours per day of interdisciplinary therapy in a comprehensive inpatient rehab setting.  Physiatrist is providing close team supervision and 24 hour management of active medical problems listed below.  Physiatrist and rehab team continue to assess barriers to discharge/monitor patient progress toward functional and medical goals  Care Tool:  Bathing    Body parts bathed by patient: Right arm, Chest, Abdomen, Right upper leg, Left upper leg, Face, Front perineal area   Body parts bathed  by helper: Left arm, Buttocks, Right lower leg, Left lower leg     Bathing assist Assist Level: Moderate Assistance - Patient 50 - 74%     Upper Body Dressing/Undressing Upper body dressing   What is the patient wearing?: Hospital gown only(hospital scurb)    Upper body assist Assist Level: Moderate Assistance - Patient 50 - 74%    Lower Body Dressing/Undressing Lower body dressing    Lower body dressing activity did not occur: Refused What is the patient wearing?: Hospital gown only, Incontinence brief(hospital scrub)     Lower body assist Assist for lower body dressing: 2 Helpers     Toileting Toileting    Toileting assist Assist for toileting: Maximal Assistance - Patient 25 - 49%     Transfers Chair/bed transfer  Transfers assist     Chair/bed transfer assist level: Dependent - mechanical lift(stedy)     Locomotion Ambulation   Ambulation assist   Ambulation activity did not occur: Safety/medical concerns(weakness)          Walk 10 feet activity   Assist           Walk 50 feet activity   Assist           Walk 150 feet activity   Assist Walk 150 feet activity did not occur: Safety/medical concerns(weakness)         Walk 10 feet on uneven surface  activity   Assist           Wheelchair  Assist Will patient use wheelchair at discharge?: Yes Type of Wheelchair: Manual    Wheelchair assist level: Moderate Assistance - Patient 50 - 74% Max wheelchair distance: 10    Wheelchair 50 feet with 2 turns activity    Assist    Wheelchair 50 feet with 2 turns activity did not occur: Safety/medical concerns(weakness)       Wheelchair 150 feet activity     Assist Wheelchair 150 feet activity did not occur: Safety/medical concerns(weakness)          Medical Problem List and Plan: 1.  Debility secondary to acute renal failure/ascites/multi-medical  Continue CIR  Weekend notes reviewed- stable 2.   Antithrombotics:   Right brachiocephalic DVT 09/22/5359: Eliquis             -antiplatelet therapy: N/A 3. Pain Management: Hydrocodone as needed 4. Mood: Provide emotional support             -antipsychotic agents: N/A 5. Neuropsych: This patient is capable of making decisions on his own behalf. 6. Skin/Wound Care/multiple decubiti sacrum/heels.:  Prevalon boots and follow-up wound care nurse routine skin checks 7. Fluids/Electrolytes/Nutrition: Routine in and outs.   8.  GI bleed.  EGD 01/21/2019.  Follow-up gastroenterology recommendations.    Hemoglobin 7.5 on 6/27, labs pending  Added simethicone for gas with good results 9.  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  Status post left brachiocephalic AV fistula.  Hemodialysis Tuesday Thursday Saturday.  Recommendation per nephro. 10.  Plasma cell myeloma.  Outpatient follow-up with oncology to discuss formal chemotherapy.  Appreciate oncology recommendations  Recs per oncology 11.  Right pleural effusion.  Status post thoracentesis 01/16/2019. 12.  Orthostasis.  ProAmatine 10 mg 3 times daily.    Controlled on 6/29  Monitor with increased mobility 13.  Episodic SVT.  8 beats noted 02/08/2019.  Amiodarone as directed.    Stable on 6/29  Monitor with increased mobility 13.  History of CVA with recurrent right hemiparesis. 14.  Steroid-induced hyperglycemia  Elevated on 6/28, continue to monitor  LOS: 5 days A FACE TO FACE EVALUATION WAS PERFORMED   Lorie Phenix 02/15/2019, 10:44 AM

## 2019-02-16 ENCOUNTER — Inpatient Hospital Stay (HOSPITAL_COMMUNITY): Payer: Medicare Other | Admitting: Occupational Therapy

## 2019-02-16 ENCOUNTER — Inpatient Hospital Stay (HOSPITAL_COMMUNITY): Payer: Medicare Other

## 2019-02-16 ENCOUNTER — Inpatient Hospital Stay (HOSPITAL_COMMUNITY): Payer: Medicare Other | Admitting: *Deleted

## 2019-02-16 DIAGNOSIS — I951 Orthostatic hypotension: Secondary | ICD-10-CM

## 2019-02-16 LAB — BPAM RBC
Blood Product Expiration Date: 202007282359
ISSUE DATE / TIME: 202006291634
Unit Type and Rh: 5100

## 2019-02-16 LAB — TYPE AND SCREEN
ABO/RH(D): O POS
Antibody Screen: NEGATIVE
Unit division: 0

## 2019-02-16 MED ORDER — MIDODRINE HCL 5 MG PO TABS
15.0000 mg | ORAL_TABLET | Freq: Three times a day (TID) | ORAL | Status: DC
  Administered 2019-02-16 – 2019-03-05 (×52): 15 mg via ORAL
  Filled 2019-02-16 (×49): qty 3

## 2019-02-16 MED ORDER — DARBEPOETIN ALFA 200 MCG/0.4ML IJ SOSY: 200.0000 ug | PREFILLED_SYRINGE | INTRAMUSCULAR | Status: DC

## 2019-02-16 MED ORDER — HEPARIN SODIUM (PORCINE) 1000 UNIT/ML IJ SOLN
INTRAMUSCULAR | Status: AC
  Administered 2019-02-16: 3200 [IU] via INTRAVENOUS_CENTRAL
  Filled 2019-02-16: qty 4

## 2019-02-16 NOTE — Plan of Care (Signed)
  Problem: Consults Goal: RH GENERAL PATIENT EDUCATION Description: See Patient Education module for education specifics. Outcome: Progressing Goal: Skin Care Protocol Initiated - if Braden Score 18 or less Description: If consults are not indicated, leave blank or document N/A Outcome: Progressing   Problem: RH BOWEL ELIMINATION Goal: RH STG MANAGE BOWEL WITH ASSISTANCE Description: STG Manage Bowel with Assistance. Min Outcome: Progressing Goal: RH STG MANAGE BOWEL W/MEDICATION W/ASSISTANCE Description: STG Manage Bowel with Medication with Assistance. Min Outcome: Progressing   Problem: RH SKIN INTEGRITY Goal: RH STG SKIN FREE OF INFECTION/BREAKDOWN Description: Skin free of infection & further breakdown. Mod assistance. Outcome: Progressing Goal: RH STG MAINTAIN SKIN INTEGRITY WITH ASSISTANCE Description: STG Maintain Skin Integrity With Assistance. Mod assist. Outcome: Progressing   Problem: RH SAFETY Goal: RH STG ADHERE TO SAFETY PRECAUTIONS W/ASSISTANCE/DEVICE Description: STG Adhere to Safety Precautions With Assistance/Device. Mod assist. Outcome: Progressing   Problem: RH PAIN MANAGEMENT Goal: RH STG PAIN MANAGED AT OR BELOW PT'S PAIN GOAL Description: Pain level <3. Outcome: Progressing

## 2019-02-16 NOTE — Progress Notes (Signed)
Physical Therapy Session Note  Patient Details  Name: Scott Mcdowell MRN: [id number] Date of Birth: September 29, 1940  Today's Date: 02/16/2019 PT Individual Time: 0900-1000 PT Individual Time Calculation (min): 60 min   Short Term Goals: Week 1:  PT Short Term Goal 1 (Week 1): pt will move supine> sit wiht min assist PT Short Term Goal 2 (Week 1): pt will move sit> supine with mod/max assist PT Short Term Goal 3 (Week 1): pt will transfer bed>< w/c wiht assistance of 1 person PT Short Term Goal 4 (Week 1): pt will initiate gait training  Skilled Therapeutic Interventions/Progress Updates:     Patient in w/c eating lunch upon PT arrival. Patient alert and agreeable to PT session. PT had patient sit on the edge of the w/c without back support to finish eating lunch and discussed nutrition intake with patient for 15 minutes. Patient excited to show therapist that he is able to lift his R LE into 1/4 ROM for hip flexion and knee extension in sitting.  Therapeutic Activity: Transfers: Patient performed sit to/from stand x1 from the w/c using the stedy with max A +2 and mod A of 1 to stand from the [place] x3. Stood 2 min x2 and 4 minutes x1 in the stedy with min A with cues for R quad activation in standing. Provided verbal cues for leaning forward to stand, pushing through B LEs. He performed a slide board transfer to/from the mat table with mod A of 1 person with total A for set up. Provided cues for board and hand placement, head-hips relationship, and w/c set up for transfer.   Wheelchair Mobility:  Patient propelled wheelchair 25 feet, limited by fatigue, using L UE and LE hemi technique with supervision and min A x1 for initiation. Provided verbal cues for coordination of hemi technique.  Neuromuscular Re-ed: Patient performed sit to/from stand initiation with therapist seated on a rolling stool in front having patient push PT away to promote forward lean to lift buttocks off the EOM. Then  progressed to patient reaching for back of a chair in front of him as PT assisted with initiating lift off from mat with min-mod A while facilitating R knee.  Therapeutic Exercise: Patient performed the following exercises with verbal and tactile cues for proper technique.  Patient in w/c at end of session with breaks locked, seat belt alarm set, and all needs within reach.    Therapy Documentation Precautions:  Precautions Precautions: Fall Precaution Comments: R hemiparesis, h/o 1 fall in past 1 year; hemiparesis more apparent due to debility Restrictions Weight Bearing Restrictions: No Vital Signs: Therapy Vitals Pulse Rate: 76 BP: 127/72 Patient Position (if appropriate): Sitting Oxygen Therapy SpO2: 99 % O2 Device: Room Air Pain: Pain Assessment Pain Scale: 0-10 Pain Score: 0-No pain    Therapy/Group: Individual Therapy  Wynona Duhamel L Pearlean Sabina PT, DPT  02/16/2019, 4:27 PM

## 2019-02-16 NOTE — Progress Notes (Signed)
[facility] NEPHROLOGY PROGRESS NOTE  Assessment/ Plan: Pt is a 78 y.o. yo male with myeloma presented with pneumonia complicated by AKI requiring dialysis.  Patient had respiratory failure requiring intubation, acute blood loss anemia and upper GI bleed.  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  He has TDC and AV fistula.  -He has right IJ TDC and left brachiocephalic AV fistula. -He has a outpatient spot for TTS at [place] at 12:15 -Had extra treatment yesterday but unable to take of much fluid only around 400 cc.  Plan for regular treatment today, only 3 hours because of high census. Lower temp, midodrine and albumin for hypotension.  # Anemia due to CKD: Received Feraheme on 6/15 and then 4 doses of Ferrlecit started on 6/27.  Continue ESA.  Monitor hemoglobin.  # Secondary hyperparathyroidism: Phosphorus level acceptable. PTH 40.  Continue Renvela.  # Hypotension/volume: Increase midodrine to 15 mg 3 times daily.  Monitor blood pressure.  #Right upper extremity DVT: On Eliquis per primary team.  #Plasma cell myeloma per oncology, on a steroid.   Subjective: Seen and examined at rehab.  He did not tolerate ultrafiltration treatment yesterday, the UF of only around 392 cc.  He reports lower extremity edema and fatigue.  Denies chest pain, shortness of breath, nausea or vomiting.  Objective Vital signs in last 24 hours: Vitals:   02/15/19 1843 02/15/19 1952 02/16/19 0431 02/16/19 0911  BP: 135/64 133/85 110/64 127/72  Pulse: 78 84 84 76  Resp: 16 20 18    Temp: (!) 97.4 F (36.3 C) (!) 97.5 F (36.4 C) 98 F (36.7 C)   TempSrc: Oral Oral    SpO2: 98% 98% 97%   Weight: 107 kg     Height:       Weight change:   Intake/Output Summary (Last 24 hours) at 02/16/2019 1046 Last data filed at 02/16/2019 0435 Gross per 24 hour  Intake 515 ml  Output 592 ml  Net -77 ml       Labs: Basic Metabolic Panel: Recent Labs  Lab  02/11/19 1314 02/13/19 1621 02/15/19 1646  NA 137 137 138  K 4.5 4.7 4.5  CL 98 98 100  CO2 26 24 26   GLUCOSE 125* 151* 119*  BUN 60* 80* 96*  CREATININE 5.93* 6.09* 6.21*  CALCIUM 9.2 9.2 8.9  PHOS 4.4 5.0* 4.6   Liver Function Tests: Recent Labs  Lab 02/11/19 1314 02/13/19 1621 02/15/19 1646  ALBUMIN 3.4* 3.8 3.7   No results for input(s): LIPASE, AMYLASE in the last 168 hours. No results for input(s): AMMONIA in the last 168 hours. CBC: Recent Labs  Lab 02/10/19 0542 02/11/19 1314 02/13/19 1621 02/15/19 1646  WBC 6.6 6.2 6.8 6.7  NEUTROABS 4.6  --   --   --   HGB 7.7* 7.4* 7.5* 7.9*  HCT 24.2* 24.1* 24.0* 25.1*  MCV 103.0* 104.3* 104.3* 104.1*  PLT 185 178 194 212   Cardiac Enzymes: No results for input(s): CKTOTAL, CKMB, CKMBINDEX, TROPONINI in the last 168 hours. CBG: Recent Labs  Lab 02/11/19 1151 02/11/19 1738 02/12/19 0614  GLUCAP 109* 136* 117*    Iron Studies: No results for input(s): IRON, TIBC, TRANSFERRIN, FERRITIN in the last 72 hours. Studies/Results: No results found.  Medications: Infusions: . sodium chloride    . sodium chloride    . ferric gluconate (FERRLECIT/NULECIT) IV 125 mg (02/13/19 1749)    Scheduled Medications: . sodium chloride   Intravenous Once  .  apixaban  5 mg Oral BID  . Chlorhexidine Gluconate Cloth  6 each Topical Q0600  . darbepoetin (ARANESP) injection - DIALYSIS  200 mcg Intravenous Q Tue-HD  . feeding supplement (NEPRO CARB STEADY)  237 mL Oral TID BM  . liver oil-zinc oxide   Topical 5 X Daily  . midodrine  10 mg Oral TID WC  . multivitamin  1 tablet Oral QHS  . nystatin   Topical TID  . pantoprazole  40 mg Oral BID  . polyethylene glycol  17 g Oral Daily  . senna-docusate  1 tablet Oral QHS  . sevelamer carbonate  2.4 g Oral TID WC  . simethicone  80 mg Oral TID    have reviewed scheduled and prn medications.  Physical Exam: General:NAD, comfortable Heart:RRR, s1s2 nl, no rubs Lungs: Clear  bilateral, no crackle Abdomen:soft, Non-tender, non-distended Extremities: Lower extremity pitting edema and right upper arm edema.  Unchanged Dialysis Access: Right IJ TDC and left upper extremity AV fistula with bruit and thrill.   Prasad  02/16/2019,10:46 AM  LOS: 6 days  Pager: [phone]

## 2019-02-16 NOTE — Progress Notes (Signed)
[facility] PHYSICAL MEDICINE & REHABILITATION PROGRESS NOTE  Subjective/Complaints: Patient seen sitting up in his chair this morning.  States he slept well overnight.  Discussed edema with nursing.  He was seen by nephrology yesterday, notes reviewed.  ROS: Denies CP, SOB, N/V/D  Objective: Vital Signs: Blood pressure 127/72, pulse 76, temperature 98 F (36.7 C), resp. rate 18, height 6\' 2"  (1.88 m), weight 107 kg, SpO2 97 %. No results found. Recent Labs    02/13/19 1621 02/15/19 1646  WBC 6.8 6.7  HGB 7.5* 7.9*  HCT 24.0* 25.1*  PLT 194 212   Recent Labs    02/13/19 1621 02/15/19 1646  NA 137 138  K 4.7 4.5  CL 98 100  CO2 24 26  GLUCOSE 151* 119*  BUN 80* 96*  CREATININE 6.09* 6.21*  CALCIUM 9.2 8.9    Physical Exam: BP 127/72   Pulse 76   Temp 98 F (36.7 C)   Resp 18   Ht 6\' 2"  (1.88 m)   Wt 107 kg   SpO2 97%   BMI 30.29 kg/m  Constitutional: No distress . Vital signs reviewed. HENT: Normocephalic.  Atraumatic. Eyes: EOMI.  No discharge. Cardiovascular: No JVD. Respiratory: Normal effort. GI: Non-distended. Musc: No edema or tenderness in extremities. Musc: Generalized edema, stable Neurological: Alert Follows commands Motor:  Right upper extremity: 3-/5 proximal distal Right lower extremity: 3--3/5 proximal to distal, stable Skin: Skin is warm and dry.  Sacral ulcers not examined today Psychiatric: pleasant and cooperative  Assessment/Plan: 1. Functional deficits secondary to debility which require 3+ hours per day of interdisciplinary therapy in a comprehensive inpatient rehab setting.  Physiatrist is providing close team supervision and 24 hour management of active medical problems listed below.  Physiatrist and rehab team continue to assess barriers to discharge/monitor patient progress toward functional and medical goals  Care Tool:  Bathing    Body parts bathed by patient: Right arm, Chest, Abdomen, Right upper leg, Left upper  leg, Face, Front perineal area   Body parts bathed by helper: Left arm, Buttocks, Right lower leg, Left lower leg     Bathing assist Assist Level: Moderate Assistance - Patient 50 - 74%     Upper Body Dressing/Undressing Upper body dressing   What is the patient wearing?: Pull over shirt    Upper body assist Assist Level: Moderate Assistance - Patient 50 - 74%    Lower Body Dressing/Undressing Lower body dressing    Lower body dressing activity did not occur: Refused What is the patient wearing?: Pants, Incontinence brief     Lower body assist Assist for lower body dressing: Maximal Assistance - Patient 25 - 49%     Toileting Toileting    Toileting assist Assist for toileting: Total Assistance - Patient < 25%     Transfers Chair/bed transfer  Transfers assist     Chair/bed transfer assist level: Moderate Assistance - Patient 50 - 74%     Locomotion Ambulation   Ambulation assist   Ambulation activity did not occur: Safety/medical concerns(weakness)          Walk 10 feet activity   Assist           Walk 50 feet activity   Assist           Walk 150 feet activity   Assist Walk 150 feet activity did not occur: Safety/medical concerns(weakness)         Walk 10 feet on uneven surface  activity   Assist  Wheelchair     Assist Will patient use wheelchair at discharge?: Yes Type of Wheelchair: Manual    Wheelchair assist level: Moderate Assistance - Patient 50 - 74% Max wheelchair distance: 10    Wheelchair 50 feet with 2 turns activity    Assist    Wheelchair 50 feet with 2 turns activity did not occur: Safety/medical concerns(weakness)       Wheelchair 150 feet activity     Assist Wheelchair 150 feet activity did not occur: Safety/medical concerns(weakness)          Medical Problem List and Plan: 1.  Debility secondary to acute renal failure/ascites/multi-medical  Continue CIR 2.   Antithrombotics:   Right brachiocephalic DVT 09/28/5619: Eliquis             -antiplatelet therapy: N/A 3. Pain Management: Hydrocodone as needed 4. Mood: Provide emotional support             -antipsychotic agents: N/A 5. Neuropsych: This patient is capable of making decisions on his own behalf. 6. Skin/Wound Care/multiple decubiti sacrum/heels.:  Prevalon boots and follow-up wound care nurse routine skin checks 7. Fluids/Electrolytes/Nutrition: Routine in and outs.   8.  GI bleed.  EGD 01/21/2019.  Follow-up gastroenterology recommendations.    Hemoglobin 7.9 on 6/29  Added simethicone for gas with good results 9.    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  Status post left brachiocephalic AV fistula.  Hemodialysis Tuesday Thursday Saturday.  Recommendation per nephro. 10.  Plasma cell myeloma.  Outpatient follow-up with oncology to discuss formal chemotherapy.  Appreciate oncology recommendations  Recs per oncology 11.  Right pleural effusion.  Status post thoracentesis 01/16/2019. 12.  Orthostasis.  ProAmatine 10 mg 3 times daily.    Relatively controlled on 6/30  Monitor with increased mobility 13.  Episodic SVT.  8 beats noted 02/08/2019.  Amiodarone as directed.    Stable on 6/30  Monitor with increased mobility 13.  History of CVA with recurrent right hemiparesis. 14.  Steroid-induced hyperglycemia  Elevated on 6/28, continue to monitor  LOS: 6 days A FACE TO FACE EVALUATION WAS PERFORMED  Ankit Lorie Phenix 02/16/2019, 10:30 AM

## 2019-02-16 NOTE — Progress Notes (Signed)
Occupational Therapy Session Note  Patient Details  Name: Scott Mcdowell MRN: [id number] Date of Birth: 03-22-1941  Today's Date: 02/16/2019 OT Individual Time: 1115-1155 OT Individual Time Calculation (min): 40 min    Short Term Goals: Week 1:  OT Short Term Goal 1 (Week 1): Pt will complete bathing with mod assist at sit > stand level OT Short Term Goal 2 (Week 1): Pt will complete LB dressing at max assist level OT Short Term Goal 3 (Week 1): Pt will complete toilet transfer max assist stand pivot OT Short Term Goal 4 (Week 1): Pt will complete 1 grooming task in standing for increased standing tolerance    Skilled Therapeutic Interventions/Progress Updates:    Pt received in his w/c and stated he was "absolutely exhausted" but did agree to try therapy.  Initially worked on a/arom exercises of R arm focusing on distal control of elbow, wrist, fingers with table tops slides, towel scrunches.  Pt had to return to bed to prepare for dialysis.   Sit to stand to stedy lift with max A of 2.  Once in stedy, he practiced sit to stand 3x holding an upright stand of 10 seconds each time. Pt stated he could not do any more.  Transferred to bed, max to move to supine.  Positioned in bed with pillows.  Damien Fusi still with significant edema.   Pt resting in bed with all needs met.   Therapy Documentation Precautions:  Precautions Precautions: Fall Precaution Comments: R hemiparesis, h/o 1 fall in past 1 year; hemiparesis more apparent due to debility Restrictions Weight Bearing Restrictions: No    Vital Signs: Therapy Vitals Pulse Rate: 76 BP: 127/72 Pain: Pain Assessment Pain Scale: 0-10 Pain Score: 0-No pain    Therapy/Group: Individual Therapy  [place] 02/16/2019, 12:35 PM

## 2019-02-16 NOTE — Progress Notes (Signed)
Occupational Therapy Session Note  Patient Details  Name: Scott Mcdowell MRN: [id number] Date of Birth: 1941/08/08  Today's Date: 02/16/2019 OT Individual Time: 0700-0810 OT Individual Time Calculation (min): 70 min    Short Term Goals: Week 1:  OT Short Term Goal 1 (Week 1): Pt will complete bathing with mod assist at sit > stand level OT Short Term Goal 2 (Week 1): Pt will complete LB dressing at max assist level OT Short Term Goal 3 (Week 1): Pt will complete toilet transfer max assist stand pivot OT Short Term Goal 4 (Week 1): Pt will complete 1 grooming task in standing for increased standing tolerance  Skilled Therapeutic Interventions/Progress Updates:    Pt asleep upon arrival but easily aroused.  Pt required min A for supine>sit EOB in preparation for transfer with Stedy.  Pt required CGA for sitting balance EOB.  Pt required mod A for sit<>stand from [place].  Pt requested use of toilet for BM.  Pt incontinent of bowel but also had a large BM.  Pt required tot a for hygiene while standing in [place].  OT intervention with focus on sit<>stand, standing balance with UE support in [place], BADL retraining, sitting balance, activity tolerance, and safety awareness to increase independence with BADLs.  See Care Tool for assist level.  Pt performed sit<>stand in Stedy X 4 with mod A.  Pt remained in w/c with belt alarm activated, half lap tray in place, and all needs within reach.   Therapy Documentation Precautions:  Precautions Precautions: Fall Precaution Comments: R hemiparesis, h/o 1 fall in past 1 year; hemiparesis more apparent due to debility Restrictions Weight Bearing Restrictions: No Pain:  Pt denies pain this morning   Therapy/Group: Individual Therapy  [name] 02/16/2019, 8:15 AM

## 2019-02-17 ENCOUNTER — Inpatient Hospital Stay (HOSPITAL_COMMUNITY): Payer: Medicare Other

## 2019-02-17 ENCOUNTER — Inpatient Hospital Stay (HOSPITAL_COMMUNITY): Payer: Medicare Other | Admitting: *Deleted

## 2019-02-17 ENCOUNTER — Inpatient Hospital Stay (HOSPITAL_COMMUNITY): Payer: Medicare Other | Admitting: Occupational Therapy

## 2019-02-17 LAB — HEPATITIS B SURFACE ANTIGEN: Hepatitis B Surface Ag: NEGATIVE

## 2019-02-17 LAB — HEPATITIS B SURFACE ANTIBODY,QUALITATIVE: Hep B S Ab: NONREACTIVE

## 2019-02-17 MED ORDER — CHLORHEXIDINE GLUCONATE CLOTH 2 % EX PADS
6.0000 | MEDICATED_PAD | Freq: Every day | CUTANEOUS | Status: DC
  Administered 2019-02-18 – 2019-02-23 (×5): 6 via TOPICAL

## 2019-02-17 MED ORDER — DARBEPOETIN ALFA 200 MCG/0.4ML IJ SOSY
200.0000 ug | PREFILLED_SYRINGE | INTRAMUSCULAR | Status: DC
  Administered 2019-02-18 – 2019-03-04 (×3): 200 ug via INTRAVENOUS
  Filled 2019-02-17 (×3): qty 0.4

## 2019-02-17 MED ORDER — SEVELAMER CARBONATE 800 MG PO TABS
2400.0000 mg | ORAL_TABLET | Freq: Three times a day (TID) | ORAL | Status: DC
  Administered 2019-02-17 – 2019-03-05 (×48): 2400 mg via ORAL
  Filled 2019-02-17 (×49): qty 3

## 2019-02-17 NOTE — Patient Care Conference (Signed)
Inpatient RehabilitationTeam Conference and Plan of Care Update Date: 02/17/2019   Time: 2:25 PM    Patient Name: Scott Mcdowell      Medical Record Number: [id number]  Date of Birth: 14-Jun-1941 Sex: Male         Room/Bed: 4M12C/4M12C-01 Payor Info: Payor: [employer] / Plan: [employer] MEDICARE / Product Type: *No Product type* /    Admitting Diagnosis: 5. Gen Team  Debility, malaise, 11-18 days  Admit Date/Time:  02/10/2019  4:33 PM Admission Comments: No comment available   Primary Diagnosis:  <principal problem not specified> Principal Problem: <principal problem not specified>  Patient Active Problem List   Diagnosis Date Noted  . Orthostatic hypotension   . Steroid-induced hyperglycemia   . History of supraventricular tachycardia   . ESRD on dialysis ([place])   . History of GI bleed   . Debility 02/10/2019  . AKI (acute kidney injury) ([place])   . CKD (chronic kidney disease), stage IV ([place])   . 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 Pleural effusion   . History of CVA with residual deficit   . SVT (supraventricular tachycardia) ([place])   . Orthostasis   . ESRD (end stage renal disease) ([place])   . Gastrointestinal hemorrhage   . Pressure injury of skin 01/30/2019  . Goals of care, counseling/discussion 01/29/2019  . 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 Shock ([place]) 01/21/2019  . Acute blood loss anemia 01/21/2019  . Upper GI bleed 01/21/2019  . B12 deficiency anemia 01/15/2019  . Pleural effusion on right 01/15/2019  . Ascites 01/15/2019  . CAP (community acquired pneumonia) 01/15/2019  . Elevated troponin 01/15/2019  . Hypercalcemia 01/14/2019  . ARF (acute renal failure) ([place]) 01/14/2019  . Hyperkalemia 01/14/2019    Expected Discharge Date: Expected Discharge Date: 03/02/19  Team Members Present: Physician leading conference: Dr. Delice Lesch Social Worker Present: Ovidio Kin, LCSW Nurse Present: Rozetta Nunnery, RN PT  Present: Georjean Mode, PT OT Present: Roanna Epley, COTA SLP Present: Stormy Fabian, SLP PPS Coordinator present : Gunnar Fusi, SLP     Current Status/Progress Goal Weekly Team Focus  Medical   Debility secondary to acute renal failure/ascites/multi-medical  Improve mobility, transfers, edema, orthostasis, ESRD, myeloma, ABLA  See above   Bowel/Bladder   incontinent of bowels, continent of bladder (pt oliguric, voids about once per day)  bowel program, remain continent of bladder  assess B&B q shift, prn   Swallow/Nutrition/ Hydration             ADL's   bathing-mod A, UB dressing-mod A, LB dressing-max A, functional transfers-max A or Stedy; sit<>stand-max A without Stedy, RUE edema and limited functional use  min A overall  activity tolerance, functional transfers, sit<>stand, standing balance, BADL retraining   Mobility   performance varies AM-PM wiht fatigue; total assist sit> supine, mod assist slide board transfer, pre-gait  S basic transfer, min assist car transfer, S w/c x 150', gait and steps TBD  activity tolerance, sitting balance, R neuro re-Scott, transfers, pt Scott, ramp discussion with family   Communication             Safety/Cognition/ Behavioral Observations            Pain   pt has no c/o of pain  remain pain free   assess pain q shift, prn   Skin   stage 1 on bilateral heels, surgical incison (LUE, right neck), Cracking on bilateral feet, MASD on groin/butt/sacrum,  skin tear on butt  address current skin issues, prevent further/new breakdown  assess skin q shift, prn      *See Care Plan and progress notes for long and short-term goals.     Barriers to Discharge  Current Status/Progress Possible Resolutions Date Resolved   Physician    Medical stability;Hemodialysis     See above  Therapies, Nephro recs, follow labs, Onc recs      Nursing                  PT                    OT                  SLP                SW                Discharge  Planning/Teaching Needs:  Home with family members providng care both daughter's not currently working and son transport to HD      Team Discussion:  Making slow progress in his therapies limited by his edema in his extremities. Using stedy for transfer-mod/max level. Goals min wheelchair level. Hemoglobin stable now. Cardiac issues stable according to MD. No ambulation goals as of yet. All goals wheelchair level. Will need 24 hr physical care at discharge. Will need much family education prior to DC home.  Revisions to Treatment Plan:  DC 7/14    Continued Need for Acute Rehabilitation Level of Care: The patient requires daily medical management by a physician with specialized training in physical medicine and rehabilitation for the following conditions: Daily direction of a multidisciplinary physical rehabilitation program to ensure safe treatment while eliciting the highest outcome that is of practical value to the patient.: Yes Daily medical management of patient stability for increased activity during participation in an intensive rehabilitation regime.: Yes Daily analysis of laboratory values and/or radiology reports with any subsequent need for medication adjustment of medical intervention for : Renal problems;Other;Wound care problems   I attest that I was present, lead the team conference, and concur with the assessment and plan of the team. Teleconference held due to COVID 19   Reginaldo Hazard, Gard[place] Rhyme 02/18/2019, 9:36 AM

## 2019-02-17 NOTE — Progress Notes (Signed)
Occupational Therapy Session Note  Patient Details  Name: Scott Mcdowell MRN: [id number] Date of Birth: [birth year]  Today's Date: 02/17/2019 OT Individual Time: 1115-1200 OT Individual Time Calculation (min): 45 min    Short Term Goals: Week 1:  OT Short Term Goal 1 (Week 1): Pt will complete bathing with mod assist at sit > stand level OT Short Term Goal 2 (Week 1): Pt will complete LB dressing at max assist level OT Short Term Goal 3 (Week 1): Pt will complete toilet transfer max assist stand pivot OT Short Term Goal 4 (Week 1): Pt will complete 1 grooming task in standing for increased standing tolerance  Skilled Therapeutic Interventions/Progress Updates:    Treatment session with focus on sit > stand, standing balance, and RUE NMR.  Pt received upright in w/c agreeable to therapy session.  Pt completed slide board transfer w/c > therapy mat with max assist +2 faded to mod assist +2 during session.  Engaged in sit > stand with focus on anterior weight shift and pushing up from mat with LUE while therapist positioned RUE over therapist's shoulder to facilitate weight shift and for improved positioning.  Engaged in reaching in standing with focus on upright standing posture and slight weight shifting to tolerance with +2 for safety.  Engaged in [place] in sitting with bicep curls with BUE on ball to promote symmetry with movement and utilized dowel rod in RUE while completing elbow extension/reaching.  Pt returned to room and left upright in w/c with seat belt alarm on and all needs in reach.  Therapy Documentation Precautions:  Precautions Precautions: Fall Precaution Comments: R hemiparesis, h/o 1 fall in past 1 year; hemiparesis more apparent due to debility Restrictions Weight Bearing Restrictions: No Pain:  Pt with no c/o pain   Therapy/Group: Individual Therapy  Simonne Come 02/17/2019, 12:36 PM

## 2019-02-17 NOTE — Progress Notes (Signed)
[facility] PHYSICAL MEDICINE & REHABILITATION PROGRESS NOTE  Subjective/Complaints: Patient seen laying in bed this morning.  He states he slept fairly overnight due to bed positioning and discomfort.  Discussed edema with therapies.  He was seen by nephrology yesterday, notes reviewed.  ROS: Denies CP, SOB, N/V/D  Objective: Vital Signs: Blood pressure (!) 92/56, pulse 76, temperature 98.3 F (36.8 C), resp. rate 18, height 6\' 2"  (1.88 m), weight 111.6 kg, SpO2 97 %. No results found. Recent Labs    02/15/19 1646  WBC 6.7  HGB 7.9*  HCT 25.1*  PLT 212   Recent Labs    02/15/19 1646  NA 138  K 4.5  CL 100  CO2 26  GLUCOSE 119*  BUN 96*  CREATININE 6.21*  CALCIUM 8.9    Physical Exam: BP (!) 92/56 (BP Location: Left Leg)   Pulse 76   Temp 98.3 F (36.8 C)   Resp 18   Ht 6\' 2"  (1.88 m)   Wt 111.6 kg   SpO2 97%   BMI 31.58 kg/m  Constitutional: No distress . Vital signs reviewed. HENT: Normocephalic.  Atraumatic. Eyes: EOMI.  No discharge. Cardiovascular: No JVD. Respiratory: Normal effort. GI: Non-distended. Musc: No edema or tenderness in extremities. Musc: Generalized edema, unchanged Neurological: Alert Follows commands Motor:  Right upper extremity: 3-/5 proximal distal, unchanged Right lower extremity: 3--3/5 proximal to distal, unchanged Skin: Skin is warm and dry.  Sacral ulcers not examined today Psychiatric: pleasant and cooperative  Assessment/Plan: 1. Functional deficits secondary to debility which require 3+ hours per day of interdisciplinary therapy in a comprehensive inpatient rehab setting.  Physiatrist is providing close team supervision and 24 hour management of active medical problems listed below.  Physiatrist and rehab team continue to assess barriers to discharge/monitor patient progress toward functional and medical goals  Care Tool:  Bathing    Body parts bathed by patient: Right arm, Chest, Abdomen, Right upper leg, Left  upper leg, Face, Front perineal area   Body parts bathed by helper: Left arm, Buttocks, Right lower leg, Left lower leg     Bathing assist Assist Level: Moderate Assistance - Patient 50 - 74%     Upper Body Dressing/Undressing Upper body dressing   What is the patient wearing?: Pull over shirt    Upper body assist Assist Level: Moderate Assistance - Patient 50 - 74%    Lower Body Dressing/Undressing Lower body dressing    Lower body dressing activity did not occur: Refused What is the patient wearing?: Incontinence brief, Pants     Lower body assist Assist for lower body dressing: Maximal Assistance - Patient 25 - 49%     Toileting Toileting    Toileting assist Assist for toileting: Total Assistance - Patient < 25%     Transfers Chair/bed transfer  Transfers assist     Chair/bed transfer assist level: Moderate Assistance - Patient 50 - 74%     Locomotion Ambulation   Ambulation assist   Ambulation activity did not occur: Safety/medical concerns(weakness)          Walk 10 feet activity   Assist           Walk 50 feet activity   Assist           Walk 150 feet activity   Assist Walk 150 feet activity did not occur: Safety/medical concerns(weakness)         Walk 10 feet on uneven surface  activity   Assist  Wheelchair     Assist Will patient use wheelchair at discharge?: Yes Type of Wheelchair: Manual    Wheelchair assist level: Supervision/Verbal cueing Max wheelchair distance: 25'    Wheelchair 50 feet with 2 turns activity    Assist    Wheelchair 50 feet with 2 turns activity did not occur: Safety/medical concerns(weakness)       Wheelchair 150 feet activity     Assist Wheelchair 150 feet activity did not occur: Safety/medical concerns(weakness)          Medical Problem List and Plan: 1.  Debility secondary to acute renal failure/ascites/multi-medical  Continue CIR  Team conference  today to discuss current and goals and coordination of care, home and environmental barriers, and discharge planning with nursing, case manager, and therapies.  2.  Antithrombotics:   Right brachiocephalic DVT 09/19/2246: Eliquis             -antiplatelet therapy: N/A 3. Pain Management: Hydrocodone as needed 4. Mood: Provide emotional support             -antipsychotic agents: N/A 5. Neuropsych: This patient is capable of making decisions on his own behalf. 6. Skin/Wound Care/multiple decubiti sacrum/heels.:  Prevalon boots and follow-up wound care nurse routine skin checks 7. Fluids/Electrolytes/Nutrition: Routine in and outs.   8.  GI bleed.  EGD 01/21/2019.  Follow-up gastroenterology recommendations.    Hemoglobin 7.9 on 6/29  Added simethicone for gas 9.    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  Status post left brachiocephalic AV fistula.  Hemodialysis Tuesday Thursday Saturday.  Recommendation per nephro.  Hepatitis B neg 10.  Plasma cell myeloma.  Outpatient follow-up with oncology to discuss formal chemotherapy.  Appreciate oncology recommendations  Recs per oncology 11.  Right pleural effusion.  Status post thoracentesis 01/16/2019. 12.  Orthostasis.  ProAmatine 10 mg 3 times daily.    Labile on 7/1  Monitor with increased mobility 13.  Episodic SVT.  8 beats noted 02/08/2019.  Amiodarone as directed.    Stable on 7/1  Monitor with increased mobility 13.  History of CVA with recurrent right hemiparesis. 14.  Steroid-induced hyperglycemia  Elevated on 6/28, continue to monitor  LOS: 7 days A FACE TO FACE EVALUATION WAS PERFORMED  Ankit Lorie Phenix 02/17/2019, 9:42 AM

## 2019-02-17 NOTE — Progress Notes (Signed)
Physical Therapy Session Note  Patient Details  Name: Scott Mcdowell MRN: [id number] Date of Birth: July 17, 1941  Today's Date: 02/17/2019 PT Individual Time: 1435-1535 PT Individual Time Calculation (min): 60 min   Short Term Goals: Week 1:  PT Short Term Goal 1 (Week 1): pt will move supine> sit wiht min assist PT Short Term Goal 2 (Week 1): pt will move sit> supine with mod/max assist PT Short Term Goal 3 (Week 1): pt will transfer bed>< w/c wiht assistance of 1 person PT Short Term Goal 4 (Week 1): pt will initiate gait training      Skilled Therapeutic Interventions/Progress Updates:      Pt resting in w/c, on video call with his wife.  She will take pics of home entrance and email to this PT.  PT will email home measurement form and ramp info to her.  Pt had slid forward in w/c.  With mod assist, pt able to scoot back into w/c. For transfer training from w/c, forward wt shifting with bil UE support, x 10.  Attempted sit> stand using Stedy, but pt unable with +2. He stated that he just doesn't have any energy in the PM.  Slide board transfer to slightly downhill mat, mod assist to initiate after board placement, then CGA and cues.  Seated with feet unsupported, neuromuscular re-education via demo and multimodal cues for 2 x 15 bil scapular retraction.  PT supported weight of RUE.  Therapeutic activity sitting edge of mat, kicking beach ball and soccer ball with R foot with success 18/20 kicks.  Also seated with bil feet supported, trunk rotation to R to retrieve card and then match onto horizontal board in front of him, using R hand, to facilitate forward wt shift, and decreasing posterior pelvic tilt; RUE wt bearing for neuro re-ed.  Total assist to move into supine.    Rolling L><R with min assist.    neuromuscular re-education via multimodal cues for R hip abduction in L sidelying, with flexed hips and knees, and r isolated hip extension with flexed knee; PT supporting wt of  LE.   R side lying> sitting with mod assist for bil LEs.    Slide board transfer as above to return to w/c.    At end of session, pt resting in w/c with seat belt alarm set and needs at hand.  PT informed Ed, RN of pt's fatigue and recommended +2 with use of SB to return pt to bed, rather than using Stedy.   Therapy Documentation Precautions:  Precautions Precautions: Fall Precaution Comments: R hemiparesis, h/o 1 fall in past 1 year; hemiparesis more apparent due to debility Restrictions Weight Bearing Restrictions: No  Pain: pt denies         Therapy/Group: Individual Therapy  Traniya Prichett 02/17/2019, 4:45 PM

## 2019-02-17 NOTE — Progress Notes (Signed)
Scott Mcdowell NEPHROLOGY PROGRESS NOTE  Assessment/ Plan: Pt is a 78 y.o. yo male with myeloma presented with pneumonia complicated by AKI requiring dialysis.  Patient had respiratory failure requiring intubation, acute blood loss anemia and upper GI bleed.  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  He has TDC and AV fistula.  -He has right IJ TDC and left brachiocephalic AV fistula. -He has a outpatient spot for TTS at [facility] at 12:15 -Had dialysis yesterday with 1.5 L UF.  Unable to do extra treatment today because of high census.  Plan for next treatment tomorrow.  I will lower temperature and may need albumin during dialysis.  # Anemia due to CKD: Received Feraheme on 6/15 and then 4 doses of Ferrlecit started on 6/27.  Continue ESA.  Monitor hemoglobin.  # Secondary hyperparathyroidism: Phosphorus level acceptable. PTH 40.  Continue Renvela.  # Hypotension/volume: Increase midodrine to 15 mg 3 times daily.  Monitor blood pressure.  #Right upper extremity DVT: On Eliquis per primary team.  #Plasma cell myeloma per oncology, on a steroid.   Subjective: Seen and examined at rehab.  He had HD yesterday with 1.5 L UF.  Denies chest pain, shortness of breath, nausea or vomiting.  Still has significant lower extremity edema. Objective Vital signs in last 24 hours: Vitals:   02/16/19 1905 02/16/19 1954 02/16/19 2343 02/17/19 0550  BP: (!) 90/59 (!) 103/50 116/60 (!) 92/56  Pulse: 83 78 84 76  Resp:  18  18  Temp:  (!) 97.5 F (36.4 C)  98.3 F (36.8 C)  TempSrc:  Oral    SpO2: 98% 97% 97% 97%  Weight:    111.6 kg  Height:       Weight change: 4.585 kg  Intake/Output Summary (Last 24 hours) at 02/17/2019 1202 Last data filed at 02/17/2019 0328 Gross per 24 hour  Intake 120 ml  Output 1700 ml  Net -1580 ml       Labs: Basic Metabolic Panel: Recent Labs  Lab 02/11/19 1314 02/13/19 1621 02/15/19 1646  NA 137 137 138  K  4.5 4.7 4.5  CL 98 98 100  CO2 26 24 26   GLUCOSE 125* 151* 119*  BUN 60* 80* 96*  CREATININE 5.93* 6.09* 6.21*  CALCIUM 9.2 9.2 8.9  PHOS 4.4 5.0* 4.6   Liver Function Tests: Recent Labs  Lab 02/11/19 1314 02/13/19 1621 02/15/19 1646  ALBUMIN 3.4* 3.8 3.7   No results for input(s): LIPASE, AMYLASE in the last 168 hours. No results for input(s): AMMONIA in the last 168 hours. CBC: Recent Labs  Lab 02/11/19 1314 02/13/19 1621 02/15/19 1646  WBC 6.2 6.8 6.7  HGB 7.4* 7.5* 7.9*  HCT 24.1* 24.0* 25.1*  MCV 104.3* 104.3* 104.1*  PLT 178 194 212   Cardiac Enzymes: No results for input(s): CKTOTAL, CKMB, CKMBINDEX, TROPONINI in the last 168 hours. CBG: Recent Labs  Lab 02/11/19 1151 02/11/19 1738 02/12/19 0614  GLUCAP 109* 136* 117*    Iron Studies: No results for input(s): IRON, TIBC, TRANSFERRIN, FERRITIN in the last 72 hours. Studies/Results: No results found.  Medications: Infusions: . sodium chloride    . sodium chloride    . ferric gluconate (FERRLECIT/NULECIT) IV 125 mg (02/16/19 1745)    Scheduled Medications: . sodium chloride   Intravenous Once  . apixaban  5 mg Oral BID  . Chlorhexidine Gluconate Cloth  6 each Topical Q0600  . darbepoetin (ARANESP) injection - DIALYSIS  200 mcg  Intravenous Q Tue-HD  . feeding supplement (NEPRO CARB STEADY)  237 mL Oral TID BM  . liver oil-zinc oxide   Topical 5 X Daily  . midodrine  15 mg Oral TID WC  . multivitamin  1 tablet Oral QHS  . nystatin   Topical TID  . pantoprazole  40 mg Oral BID  . polyethylene glycol  17 g Oral Daily  . senna-docusate  1 tablet Oral QHS  . sevelamer carbonate  2,400 mg Oral TID WC  . simethicone  80 mg Oral TID    have reviewed scheduled and prn medications.  Physical Exam: General:NAD, comfortable Heart:RRR, s1s2 nl, no rubs Lungs: Clear bilateral, no crackle Abdomen:soft, Non-tender, non-distended Extremities: Lower extremity pitting edema and right upper arm edema.    Dialysis Access: Right IJ TDC and left upper extremity AV fistula with bruit and thrill.  Dron Tanna Furry 02/17/2019,12:02 PM  LOS: 7 days  Pager: [phone]

## 2019-02-17 NOTE — Progress Notes (Signed)
Occupational Therapy Session Note  Patient Details  Name: Scott Mcdowell MRN: [id number] Date of Birth: 1941/06/21  Today's Date: 02/17/2019 OT Individual Time: 2831-5176 OT Individual Time Calculation (min): 75 min    Short Term Goals: Week 1:  OT Short Term Goal 1 (Week 1): Pt will complete bathing with mod assist at sit > stand level OT Short Term Goal 2 (Week 1): Pt will complete LB dressing at max assist level OT Short Term Goal 3 (Week 1): Pt will complete toilet transfer max assist stand pivot OT Short Term Goal 4 (Week 1): Pt will complete 1 grooming task in standing for increased standing tolerance  Skilled Therapeutic Interventions/Progress Updates:    OT intervention with focus on bed mobility, sitting balance, sit<>stand with Stedy, BADL retraining, activity tolerance, and safety awareness to increase independence with BADLs.  Pt requires min A for supine>sit EOB but able to reposition on EOB without assistance.  Pt completed bathing/dressing with sit<>stand from EOB with use of Stedy.  Pt requires mod A for sit<>stand from EOB with Stedy. UB dressing mod A; LB dressing max A, bathing mod A. Pt remained seated in w/c with all needs within reach and belt alarm activated.   Therapy Documentation Precautions:  Precautions Precautions: Fall Precaution Comments: R hemiparesis, h/o 1 fall in past 1 year; hemiparesis more apparent due to debility Restrictions Weight Bearing Restrictions: No  Pain:  Pt c/o buttocks pain when seated in w/c; repositioned   Therapy/Group: Individual Therapy  [name] 02/17/2019, 9:33 AM

## 2019-02-17 NOTE — Progress Notes (Signed)
Occupational Therapy Session Note  Patient Details  Name: Scott Mcdowell MRN: [id number] Date of Birth: 13-Sep-1940  Today's Date: 02/17/2019 OT Individual Time: 1000-1024 OT Individual Time Calculation (min): 24 min    Short Term Goals: Week 1:  OT Short Term Goal 1 (Week 1): Pt will complete bathing with mod assist at sit > stand level OT Short Term Goal 2 (Week 1): Pt will complete LB dressing at max assist level OT Short Term Goal 3 (Week 1): Pt will complete toilet transfer max assist stand pivot OT Short Term Goal 4 (Week 1): Pt will complete 1 grooming task in standing for increased standing tolerance  Skilled Therapeutic Interventions/Progress Updates:    1:1. Pt received in w/cwith hips scooted completely to EOC. Per pt report it was only way to relieve buttock pain. OT selects 18x20 foam/roho combo cushion for pressure relief. Pt completes SBT w/c<>EOB with MOD A in B directions with manual facilitation of head hips relationship as well as tactile cues for hand placement. Pt reports significant relief from pain with new cushion. Exited session with pt seated in w/c, exit alarm on and call light in reach  Therapy Documentation Precautions:  Precautions Precautions: Fall Precaution Comments: R hemiparesis, h/o 1 fall in past 1 year; hemiparesis more apparent due to debility Restrictions Weight Bearing Restrictions: No General:  Therapy/Group: Individual Therapy  [name] 02/17/2019, 10:27 AM

## 2019-02-17 NOTE — Progress Notes (Signed)
Pt reported feeling short of breath while lying in bed. Vitals were taken and results were normal. RN encouraged use of incentive spirometer (pt hit max of 500 ml), pt was repositioned & HOB increased. Pt reported feeling much better, and shortness of breath seemed to have been resolved. Will continue to monitor.

## 2019-02-18 ENCOUNTER — Inpatient Hospital Stay (HOSPITAL_COMMUNITY): Payer: Medicare Other

## 2019-02-18 DIAGNOSIS — D62 Acute posthemorrhagic anemia: Secondary | ICD-10-CM

## 2019-02-18 MED ORDER — HEPARIN SODIUM (PORCINE) 1000 UNIT/ML IJ SOLN
3400.0000 [IU] | Freq: Once | INTRAMUSCULAR | Status: AC
  Administered 2019-02-20: 3400 [IU]

## 2019-02-18 MED ORDER — DARBEPOETIN ALFA 200 MCG/0.4ML IJ SOSY
PREFILLED_SYRINGE | INTRAMUSCULAR | Status: AC
  Filled 2019-02-18: qty 0.4

## 2019-02-18 MED ORDER — HEPARIN SODIUM (PORCINE) 1000 UNIT/ML IJ SOLN
INTRAMUSCULAR | Status: AC
  Filled 2019-02-18: qty 4

## 2019-02-18 MED ORDER — ALBUMIN HUMAN 25 % IV SOLN
INTRAVENOUS | Status: AC
  Filled 2019-02-18: qty 100

## 2019-02-18 MED ORDER — ALBUMIN HUMAN 25 % IV SOLN
25.0000 g | Freq: Once | INTRAVENOUS | Status: AC
  Administered 2019-02-18: 25 g via INTRAVENOUS
  Filled 2019-02-18: qty 100

## 2019-02-18 NOTE — Progress Notes (Signed)
[facility] PHYSICAL MEDICINE & REHABILITATION PROGRESS NOTE  Subjective/Complaints: Patient seen sitting up in his chair working with therapy this morning.  He states he slept well overnight.  He is happy about the fact that he was able to urinate x2 yesterday.  He is also pleased with his blood pressure.  He was seen by nephrology yesterday, notes reviewed.  ROS: Denies CP, SOB, N/V/D  Objective: Vital Signs: Blood pressure (!) 114/57, pulse 88, temperature 98.1 F (36.7 C), temperature source Oral, resp. rate 17, height 6\' 2"  (1.88 m), weight 108 kg, SpO2 96 %. No results found. Recent Labs    02/15/19 1646  WBC 6.7  HGB 7.9*  HCT 25.1*  PLT 212   Recent Labs    02/15/19 1646  NA 138  K 4.5  CL 100  CO2 26  GLUCOSE 119*  BUN 96*  CREATININE 6.21*  CALCIUM 8.9    Physical Exam: BP (!) 114/57 (BP Location: Left Leg)   Pulse 88   Temp 98.1 F (36.7 C) (Oral)   Resp 17   Ht 6\' 2"  (1.88 m)   Wt 108 kg   SpO2 96%   BMI 30.56 kg/m  Constitutional: No distress . Vital signs reviewed. HENT: Normocephalic.  Atraumatic. Eyes: EOMI.  No discharge. Cardiovascular: No JVD. Respiratory: Normal effort. GI: Non-distended. Musc: No edema or tenderness in extremities. Musc: Generalized edema, stable Neurological: Alert Follows commands Motor:  Right upper extremity: 3-/5 proximal distal, stable Right lower extremity: 3--3/5 proximal to distal, stable Skin: Skin is warm and dry.  Sacral ulcers not examined today Psychiatric: pleasant and cooperative  Assessment/Plan: 1. Functional deficits secondary to debility which require 3+ hours per day of interdisciplinary therapy in a comprehensive inpatient rehab setting.  Physiatrist is providing close team supervision and 24 hour management of active medical problems listed below.  Physiatrist and rehab team continue to assess barriers to discharge/monitor patient progress toward functional and medical goals  Care  Tool:  Bathing    Body parts bathed by patient: Right arm, Chest, Abdomen, Right upper leg, Left upper leg, Face, Front perineal area   Body parts bathed by helper: Left arm, Buttocks, Right lower leg, Left lower leg     Bathing assist Assist Level: Moderate Assistance - Patient 50 - 74%     Upper Body Dressing/Undressing Upper body dressing   What is the patient wearing?: Pull over shirt    Upper body assist Assist Level: Moderate Assistance - Patient 50 - 74%    Lower Body Dressing/Undressing Lower body dressing    Lower body dressing activity did not occur: Refused What is the patient wearing?: Incontinence brief, Pants     Lower body assist Assist for lower body dressing: Maximal Assistance - Patient 25 - 49%     Toileting Toileting    Toileting assist Assist for toileting: Total Assistance - Patient < 25%     Transfers Chair/bed transfer  Transfers assist     Chair/bed transfer assist level: Moderate Assistance - Patient 50 - 74%     Locomotion Ambulation   Ambulation assist   Ambulation activity did not occur: Safety/medical concerns(weakness)          Walk 10 feet activity   Assist           Walk 50 feet activity   Assist           Walk 150 feet activity   Assist Walk 150 feet activity did not occur: Safety/medical concerns(weakness)  Walk 10 feet on uneven surface  activity   Assist           Wheelchair     Assist Will patient use wheelchair at discharge?: Yes Type of Wheelchair: Manual    Wheelchair assist level: Supervision/Verbal cueing Max wheelchair distance: 36'    Wheelchair 50 feet with 2 turns activity    Assist    Wheelchair 50 feet with 2 turns activity did not occur: Safety/medical concerns(weakness)       Wheelchair 150 feet activity     Assist Wheelchair 150 feet activity did not occur: Safety/medical concerns(weakness)          Medical Problem List and  Plan: 1.  Debility secondary to acute renal failure/ascites/multi-medical  Continue CIR 2.  Antithrombotics:   Right brachiocephalic DVT 04/21/7341: Eliquis             -antiplatelet therapy: N/A 3. Pain Management: Hydrocodone as needed 4. Mood: Provide emotional support             -antipsychotic agents: N/A 5. Neuropsych: This patient is capable of making decisions on his own behalf. 6. Skin/Wound Care/multiple decubiti sacrum/heels.:  Prevalon boots and follow-up wound care nurse routine skin checks 7. Fluids/Electrolytes/Nutrition: Routine in and outs.   8.  GI bleed.  EGD 01/21/2019.  Follow-up gastroenterology recommendations.    Hemoglobin 7.9 on 6/29, labs with HD  Added simethicone for gas 9.    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  Status post left brachiocephalic AV fistula.  Hemodialysis Tuesday Thursday Saturday.  Recommendation per nephro.  Hepatitis B neg 10.  Plasma cell myeloma.  Outpatient follow-up with oncology to discuss formal chemotherapy.  Appreciate oncology recommendations  Recs per oncology 11.  Right pleural effusion.  Status post thoracentesis 01/16/2019. 12.  Orthostasis.  ProAmatine 10 mg 3 times daily.    Labile, but relatively low on 7/2  Monitor with increased mobility 13.  Episodic SVT.  8 beats noted 02/08/2019.  Amiodarone as directed.    Controlled on 7/2  Monitor with increased mobility 13.  History of CVA with recurrent right hemiparesis. 14.  Steroid-induced hyperglycemia  Elevated on 6/28, continue to monitor  LOS: 8 days A FACE TO FACE EVALUATION WAS PERFORMED  Ankit Lorie Phenix 02/18/2019, 9:59 AM

## 2019-02-18 NOTE — Progress Notes (Signed)
Social Work Patient ID: Scott Mcdowell, male   DOB: 04-Jun-1941, 78 y.o.   MRN: [id number]  Met with pt to discuss team conference goals min assist form wheelchair level and target discharge date 7/14. He feels he is making progress daily and will do better than the therapy team feels he will. He feels his weakness will get better since he has not had another CVA. His right leg is getting stronger and the fluid is being reduced. Discussed talking with his children and he declined this and report she will do this himself. Will work on discharge needs and have family come in for education prior to discharge home.

## 2019-02-18 NOTE — Progress Notes (Signed)
Occupational Therapy Session Note  Patient Details  Name: Scott Mcdowell MRN: [id number] Date of Birth: 02/16/1941  Today's Date: 02/18/2019 OT Individual Time: 0700-0800 OT Individual Time Calculation (min): 60 min    Short Term Goals: Week 2:  OT Short Term Goal 1 (Week 2): Pt will complete 1 grooming task in standing for increased standing tolerance OT Short Term Goal 2 (Week 2): Pt will complete bathing with mod assist at sit > stand level OT Short Term Goal 3 (Week 2): Pt will complete UB dressing with min A OT Short Term Goal 4 (Week 2): Pt will complete LB dressing with mod A  Skilled Therapeutic Interventions/Progress Updates:    Pt resting in bed upon arrival and ready for therapy.  OT intervention with focus bed mobility, sitting balance, sit<>stand with Stedy, BADL training, RUE NMR, and activity tolerance to increase independence with BADLs. Pt min A for supine>sit EOB and supervision for sitting balance EOB.  Pt able to reposition at EOB without assistance.  Pt required mod A for sit<>stand in [place] for LB dressing and transfer to w/c. Pt completed UB bathing/dressing seated in w/c at sink.  Pt exhibits increased functional use of RUE during functional tasks and decreased edema.  Pt remained seated in w/c with breakfast tray and all needs within reach.  Belt alarm activated.   Therapy Documentation Precautions:  Precautions Precautions: Fall Precaution Comments: R hemiparesis, h/o 1 fall in past 1 year; hemiparesis more apparent due to debility Restrictions Weight Bearing Restrictions: No Pain:  Pt c/o buttocks discomfort when seated in w/c; repositioned and discussed with PT possible switch to TIS w/c    Therapy/Group: Individual Therapy  [name] 02/18/2019, 8:03 AM

## 2019-02-18 NOTE — Plan of Care (Signed)
  Problem: Consults Goal: RH GENERAL PATIENT EDUCATION Description: See Patient Education module for education specifics. Outcome: Progressing Goal: Skin Care Protocol Initiated - if Braden Score 18 or less Description: If consults are not indicated, leave blank or document N/A Outcome: Progressing   Problem: RH BOWEL ELIMINATION Goal: RH STG MANAGE BOWEL WITH ASSISTANCE Description: STG Manage Bowel with Assistance. Min Outcome: Progressing Goal: RH STG MANAGE BOWEL W/MEDICATION W/ASSISTANCE Description: STG Manage Bowel with Medication with Assistance. Min Outcome: Progressing   Problem: RH SAFETY Goal: RH STG ADHERE TO SAFETY PRECAUTIONS W/ASSISTANCE/DEVICE Description: STG Adhere to Safety Precautions With Assistance/Device. Mod assist. Outcome: Progressing   Problem: RH PAIN MANAGEMENT Goal: RH STG PAIN MANAGED AT OR BELOW PT'S PAIN GOAL Description: Pain level <3. Outcome: Progressing

## 2019-02-18 NOTE — Progress Notes (Signed)
Occupational Therapy Session Note  Patient Details  Name: Scott Mcdowell MRN: [id number] Date of Birth: 1941-07-20  Today's Date: 02/18/2019 OT Individual Time: 1100-1155 OT Individual Time Calculation (min): 55 min    Short Term Goals: Week 2:  OT Short Term Goal 1 (Week 2): Pt will complete 1 grooming task in standing for increased standing tolerance OT Short Term Goal 2 (Week 2): Pt will complete bathing with mod assist at sit > stand level OT Short Term Goal 3 (Week 2): Pt will complete UB dressing with min A OT Short Term Goal 4 (Week 2): Pt will complete LB dressing with mod A  Skilled Therapeutic Interventions/Progress Updates:    OT intervention with focus on sit<>stand with Stedy, sitting balance, RUE NMR, sit<>stand without Stedy, activity tolerance, and safety awareness to increase independence with BADLs. Pt required mod A for sit<>stand at beginning of session and max A for sit<>stand at end of session secondary to fatigue. Pt engaged in sit<>stand from [facility] with chair positioned in front for support.  Pt able to push up with max A to standing upright. Pt responded well to RUE NMR with improved activation. Pt remained in TIS w/c with all needs within reach. Pt stated he was encouraged by recognized improvements today.  Therapy Documentation Precautions:  Precautions Precautions: Fall Precaution Comments: R hemiparesis, h/o 1 fall in past 1 year; hemiparesis more apparent due to debility Restrictions Weight Bearing Restrictions: No  Pain:  Pt denies pain this morning   Other Treatments: Treatments Neuromuscular Facilitation: Right;Upper Extremity;Activity to increase motor control;Activity to increase grading;Activity to increase lateral weight shifting;Activity to increase anterior-posterior weight shifting;Activity to increase sustained activation Weight Bearing Technique Weight Bearing Technique: Yes RUE Weight Bearing Technique: Extended arm seated Response to  Weight Bearing Technique: increased RUE elbow flexion, R shoulder protraction/retraction, RUE pronation/supination   Therapy/Group: Individual Therapy  [name] 02/18/2019, 12:15 PM

## 2019-02-18 NOTE — Progress Notes (Signed)
Occupational Therapy Weekly Progress Note  Patient Details  Name: Scott Mcdowell MRN: [id number] Date of Birth: 29-Jun-1941  Beginning of progress report period: February 11, 2019 End of progress report period: February 18, 2019  Patient has met 2 of 4 short term goals.  Pt progress has been slow but consistent this past week.  Pt requires min A for supine>sit EOB and max A for sit>supine in bed.  Pt uses Stedy (max A +2) for sit<>stand/transfers and slide board (max A/mod A +2) for transfers.  Pt currently requires mod A for UB dressing and max A for LB dressing with sit<>stand in [place]. Pt requires mod A for bathing seated EOB and sit<>stand with Stedy. Pt's LUE increasing with functional use but continues to exhibit increased edema.   Patient continues to demonstrate the following deficits: muscle weakness, decreased cardiorespiratoy endurance, unbalanced muscle activation and decreased coordination and decreased standing balance, hemiplegia and decreased balance strategies and therefore will continue to benefit from skilled OT intervention to enhance overall performance with BADL and Reduce care partner burden.  Patient progressing toward long term goals..  Continue plan of care.  OT Short Term Goals Week 1:  OT Short Term Goal 1 (Week 1): Pt will complete bathing with mod assist at sit > stand level OT Short Term Goal 1 - Progress (Week 1): Progressing toward goal OT Short Term Goal 2 (Week 1): Pt will complete LB dressing at max assist level OT Short Term Goal 2 - Progress (Week 1): Met OT Short Term Goal 3 (Week 1): Pt will complete toilet transfer max assist stand pivot OT Short Term Goal 3 - Progress (Week 1): Progressing toward goal OT Short Term Goal 4 (Week 1): Pt will complete 1 grooming task in standing for increased standing tolerance Week 2:  OT Short Term Goal 1 (Week 2): Pt will complete 1 grooming task in standing for increased standing tolerance OT Short Term Goal 2 (Week 2):  Pt will complete bathing with mod assist at sit > stand level OT Short Term Goal 3 (Week 2): Pt will complete UB dressing with min A OT Short Term Goal 4 (Week 2): Pt will complete LB dressing with mod A   [name] 02/18/2019, 6:36 AM

## 2019-02-18 NOTE — Progress Notes (Signed)
Physical Therapy Weekly Progress Note  Patient Details  Name: Scott Mcdowell MRN: [id number] Date of Birth: 28-Oct-1940  Beginning of progress report period: February 11, 2019 End of progress report period: February 18, 2019  Today's Date: 02/18/2019 PT Individual Time: 0915-1015 PT Individual Time Calculation (min): 60 min   Patient has met 1 of 3 short term goals.  He currently requires mod A for sit to supine, and min A for rolling and supine to sit without use of hospital bed functions. He requires mod-max A of 1 person for slide board transfers, and mod A of 2 for sit to/from stand using the [place]. He has not initiated gait training at this time and will continue you that goal for this week.   Patient continues to demonstrate the following deficits muscle weakness, decreased cardiorespiratoy endurance, impaired timing and sequencing, abnormal tone and decreased coordination and decreased sitting balance, decreased standing balance, decreased postural control, hemiplegia and decreased balance strategies and therefore will continue to benefit from skilled PT intervention to increase functional independence with mobility.  Patient progressing toward long term goals..  Continue plan of care.  PT Short Term Goals Week 1:  PT Short Term Goal 1 (Week 1): pt will move supine> sit wiht min assist PT Short Term Goal 1 - Progress (Week 1): Met PT Short Term Goal 2 (Week 1): pt will move sit> supine with mod/max assist PT Short Term Goal 2 - Progress (Week 1): Met PT Short Term Goal 3 (Week 1): pt will transfer bed>< w/c wiht assistance of 1 person PT Short Term Goal 3 - Progress (Week 1): Met PT Short Term Goal 4 (Week 1): pt will initiate gait training PT Short Term Goal 4 - Progress (Week 1): Progressing toward goal Week 2:  PT Short Term Goal 1 (Week 2): Patient will perform rolling in the bed and supine to sit with CGA. PT Short Term Goal 2 (Week 2): Patient will perform sit to supine with min  A. PT Short Term Goal 3 (Week 2): Patient will perform level slide board transfers consistently with mod A of 1 person. PT Short Term Goal 4 (Week 2): Patient will initiate gait training.  Skilled Therapeutic Interventions/Progress Updates:     Patient in w/c, slide forward to the edge of the seat, upon PT arrival. Patient alert and agreeable to PT session, and PT assisted the patient into better sitting position.  Therapeutic Activity: Bed Mobility: Patient rolling R with CGA and rolling L with min A. He performed supine to sit with min A and sit to supine with mod A. All bed mobility was done in with the bed flat and without use of rails. Provided verbal cues for reaching with opposite arm and pushing with opposite leg to roll and rolling to the L to use L arm to push up to his elbow then hand to push to sitting. Attempted to use R LE to lift L LE into the bed from sitting, but unable to perform at this time. Transfers: Patient performed slide board transfers x2 with mod-max A of 1 person with cues for head hips relationship and total A for board set up. He performed sit to/from stand x1 using the [place] with mod-max A of 2 people and x1 from the [place] with min A of 1 person. He performed standing balance in the [employer] x2 for 2 min each trial with CGA-supervision. Provided verbal cues for leaning forward to stand.  Therapeutic Exercise: Patient performed the following  exercises with verbal and tactile cues for proper technique. -Lower trunk rotations 2x10, first trial with min A and second trial independently.  -bridging 2x5 with manual facilitation through knees   Provided patient with TIS w/c for improved independent sitting in the room and pressure relief. Educated patient to call nursing staff to change tilt angle for pressure relief every 30 min or with sacral discomfort.  Patient in [place] w/c at end of session with breaks locked, seat belt alarm set, and all needs within reach.    Therapy  Documentation Precautions:  Precautions Precautions: Fall Precaution Comments: R hemiparesis, h/o 1 fall in past 1 year; hemiparesis more apparent due to debility Restrictions Weight Bearing Restrictions: No Pain: Patient denied pain throughout session.  Therapy/Group: Individual Therapy  Keldrick Pomplun L Jessiah Steinhart PT, DPT  02/18/2019, 1:17 PM

## 2019-02-18 NOTE — Progress Notes (Signed)
[facility] NEPHROLOGY PROGRESS NOTE  Assessment/ Plan: Pt is a 78 y.o. yo male with myeloma presented with pneumonia complicated by AKI requiring dialysis.  Patient had respiratory failure requiring intubation, acute blood loss anemia and upper GI bleed.  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  He has TDC and AV fistula.  -He has right IJ TDC and left brachiocephalic AV fistula. -He has a outpatient spot for TTS at [place] at 12:15. -Plan for dialysis today as per schedule, try ultrafiltration.  Lower dialysate temperature and use midodrine and albumin if needed.  # Anemia due to CKD: Received Feraheme on 6/15 and then 4 doses of Ferrlecit started on 6/27.  Continue ESA.  Monitor hemoglobin.  # Secondary hyperparathyroidism: Phosphorus level acceptable. PTH 40.  Continue Renvela.  # Hypotension/volume: Continue midodrine to 15 mg 3 times daily.  Monitor blood pressure.  #Right upper extremity DVT: On Eliquis per primary team.  #Plasma cell myeloma per oncology, on a steroid.  Okay to discharge from renal perspective.   Subjective: Seen and examined at rehab.  Still has lower extremity edema but breathing much better.  Denies nausea, vomiting, chest pain or shortness of breath.  HD today.  Objective Vital signs in last 24 hours: Vitals:   02/16/19 2343 02/17/19 0550 02/17/19 1936 02/18/19 0524  BP: 116/60 (!) 92/56 97/81 (!) 114/57  Pulse: 84 76 91 88  Resp:  18 18 17   Temp:  98.3 F (36.8 C) 98.4 F (36.9 C) 98.1 F (36.7 C)  TempSrc:   Oral Oral  SpO2: 97% 97% 96% 96%  Weight:  111.6 kg  108 kg  Height:       Weight change: -3.629 kg  Intake/Output Summary (Last 24 hours) at 02/18/2019 1144 Last data filed at 02/18/2019 0900 Gross per 24 hour  Intake 600 ml  Output 450 ml  Net 150 ml       Labs: Basic Metabolic Panel: Recent Labs  Lab 02/11/19 1314 02/13/19 1621 02/15/19 1646  NA 137 137 138  K 4.5 4.7  4.5  CL 98 98 100  CO2 26 24 26   GLUCOSE 125* 151* 119*  BUN 60* 80* 96*  CREATININE 5.93* 6.09* 6.21*  CALCIUM 9.2 9.2 8.9  PHOS 4.4 5.0* 4.6   Liver Function Tests: Recent Labs  Lab 02/11/19 1314 02/13/19 1621 02/15/19 1646  ALBUMIN 3.4* 3.8 3.7   No results for input(s): LIPASE, AMYLASE in the last 168 hours. No results for input(s): AMMONIA in the last 168 hours. CBC: Recent Labs  Lab 02/11/19 1314 02/13/19 1621 02/15/19 1646  WBC 6.2 6.8 6.7  HGB 7.4* 7.5* 7.9*  HCT 24.1* 24.0* 25.1*  MCV 104.3* 104.3* 104.1*  PLT 178 194 212   Cardiac Enzymes: No results for input(s): CKTOTAL, CKMB, CKMBINDEX, TROPONINI in the last 168 hours. CBG: Recent Labs  Lab 02/11/19 1151 02/11/19 1738 02/12/19 0614  GLUCAP 109* 136* 117*    Iron Studies: No results for input(s): IRON, TIBC, TRANSFERRIN, FERRITIN in the last 72 hours. Studies/Results: No results found.  Medications: Infusions: . sodium chloride    . sodium chloride    . ferric gluconate (FERRLECIT/NULECIT) IV 125 mg (02/16/19 1745)    Scheduled Medications: . sodium chloride   Intravenous Once  . apixaban  5 mg Oral BID  . Chlorhexidine Gluconate Cloth  6 each Topical Q0600  . darbepoetin (ARANESP) injection - DIALYSIS  200 mcg Intravenous Q Thu-HD  . feeding supplement (NEPRO  CARB STEADY)  237 mL Oral TID BM  . liver oil-zinc oxide   Topical 5 X Daily  . midodrine  15 mg Oral TID WC  . multivitamin  1 tablet Oral QHS  . nystatin   Topical TID  . pantoprazole  40 mg Oral BID  . polyethylene glycol  17 g Oral Daily  . senna-docusate  1 tablet Oral QHS  . sevelamer carbonate  2,400 mg Oral TID WC  . simethicone  80 mg Oral TID    have reviewed scheduled and prn medications.  Physical Exam: General: Not in distress, comfortable Heart:RRR, s1s2 nl, no rubs Lungs: Clear bilateral, no crackle Abdomen:soft, Non-tender, non-distended Extremities: Lower extremity pitting edema and right upper arm edema.    Dialysis Access: Right IJ TDC and left upper extremity AV fistula with bruit and thrill.  Dron Prasad Bhandari 02/18/2019,11:44 AM  LOS: 8 days  Pager: [phone]

## 2019-02-19 ENCOUNTER — Inpatient Hospital Stay (HOSPITAL_COMMUNITY): Payer: Medicare Other

## 2019-02-19 MED ORDER — CHLORHEXIDINE GLUCONATE CLOTH 2 % EX PADS
6.0000 | MEDICATED_PAD | Freq: Every day | CUTANEOUS | Status: DC
  Administered 2019-02-20 – 2019-03-01 (×7): 6 via TOPICAL

## 2019-02-19 NOTE — Plan of Care (Signed)
  Problem: Consults Goal: RH GENERAL PATIENT EDUCATION Description: See Patient Education module for education specifics. Outcome: Progressing Goal: Skin Care Protocol Initiated - if Braden Score 18 or less Description: If consults are not indicated, leave blank or document N/A Outcome: Progressing   Problem: RH BOWEL ELIMINATION Goal: RH STG MANAGE BOWEL WITH ASSISTANCE Description: STG Manage Bowel with Assistance. Min Outcome: Progressing Goal: RH STG MANAGE BOWEL W/MEDICATION W/ASSISTANCE Description: STG Manage Bowel with Medication with Assistance. Min Outcome: Progressing   Problem: RH SKIN INTEGRITY Goal: RH STG SKIN FREE OF INFECTION/BREAKDOWN Description: Skin free of infection & further breakdown. Mod assistance. Outcome: Progressing Goal: RH STG MAINTAIN SKIN INTEGRITY WITH ASSISTANCE Description: STG Maintain Skin Integrity With Assistance. Mod assist. Outcome: Progressing   Problem: RH SAFETY Goal: RH STG ADHERE TO SAFETY PRECAUTIONS W/ASSISTANCE/DEVICE Description: STG Adhere to Safety Precautions With Assistance/Device. Mod assist. Outcome: Progressing   Problem: RH PAIN MANAGEMENT Goal: RH STG PAIN MANAGED AT OR BELOW PT'S PAIN GOAL Description: Pain level <3. Outcome: Progressing

## 2019-02-19 NOTE — Progress Notes (Signed)
Physical Therapy Session Note  Patient Details  Name: Scott Mcdowell MRN: [id number] Date of Birth: [birth year]  Today's Date: 02/19/2019 PT Individual Time: 1300-1400 PT Individual Time Calculation (min): 60 min   Short Term Goals: Week 2:  PT Short Term Goal 1 (Week 2): Patient will perform rolling in the bed and supine to sit with CGA. PT Short Term Goal 2 (Week 2): Patient will perform sit to supine with min A. PT Short Term Goal 3 (Week 2): Patient will perform level slide board transfers consistently with mod A of 1 person. PT Short Term Goal 4 (Week 2): Patient will initiate gait training.  Skilled Therapeutic Interventions/Progress Updates:     Patient in TIS w/c finishing lunch upon PT arrival. Patient alert and agreeable to PT session. Patient very happy about reduced swelling in his R UE and LE following dialysis yesterday.   Therapeutic Activity: Bed Mobility: Patient performed sit to supine with min-mod A of 2 people due to fatigue. Transfers: Patient performed sit to/from stand x2 from the w/c using the Stedy with mod A +2 and from the [place] with mod-min A +1. He performed sit to/from stand at the // bars facing the outer bar to pull up with mod A +2 x1 and max A +1 x1 with PT providing facilitation and blocking R knee.   Wheelchair Mobility:  Patient propelled wheelchair 20 feet with min A using L UE and LE hemi-technique, limited by fatigue this afternoon.   Neuromuscular Re-ed: Patient performed standing balance for 2-3 minutes x2 standing in front and holding on to 1 of the // bars with CGA-min A with B UE support progressing to R UE support only to promote increased use of R UE. PT blocked R knee and provided cues for R quad activation. He performed in sitting R hip flexion x10 with min-mod A, R knee extension x10 independently with cues for full extension and PNF tapping to quads for increased muscle activation, and R DF with min A from PT and PF with minimal  resistance from therapist.     Patient in bed at end of session with breaks locked, bed alarm set, B Prevalon boots donned, and all needs within reach.    Therapy Documentation Precautions:  Precautions Precautions: Fall Precaution Comments: R hemiparesis, h/o 1 fall in past 1 year; hemiparesis more apparent due to debility Restrictions Weight Bearing Restrictions: No Vital Signs: Therapy Vitals Pulse Rate: 89 BP: (!) 125/52 Patient Position (if appropriate): Lying Oxygen Therapy SpO2: 96 % O2 Device: Room Air Pain: Patient denied pain throughout session.    Therapy/Group: Individual Therapy  Diania Co L Mistina Coatney PT, DPT  02/19/2019, 4:13 PM

## 2019-02-19 NOTE — Progress Notes (Signed)
[facility] PHYSICAL MEDICINE & REHABILITATION PROGRESS NOTE  Subjective/Complaints: Patient seen sitting up in his chair this morning.  He states he slept fairly overnight due to some shortness of breath, which improved with repositioning.  He was seen by nephrology yesterday, notes reviewed.  ROS: Denies CP, SOB, N/V/D  Objective: Vital Signs: Blood pressure (!) 125/52, pulse 89, temperature 98.3 F (36.8 C), temperature source Oral, resp. rate 16, height 6\' 2"  (1.88 m), weight 106 kg, SpO2 96 %. No results found. No results for input(s): WBC, HGB, HCT, PLT in the last 72 hours. No results for input(s): NA, K, CL, CO2, GLUCOSE, BUN, CREATININE, CALCIUM in the last 72 hours.  Physical Exam: BP (!) 125/52 (BP Location: Left Leg)   Pulse 89   Temp 98.3 F (36.8 C) (Oral)   Resp 16   Ht 6\' 2"  (1.88 m)   Wt 106 kg   SpO2 96%   BMI 30.00 kg/m  Constitutional: No distress . Vital signs reviewed. HENT: Normocephalic.  Atraumatic. Eyes: EOMI.  No discharge. Cardiovascular: No JVD. Respiratory: Normal effort. GI: Non-distended. Musc: No edema or tenderness in extremities. Musc: Generalized edema, improving Neurological: Alert Follows commands Motor:  Right upper extremity: 3-/5 proximal distal, unchanged Right lower extremity: 3--3/5 proximal to distal, unchanged Skin: Skin is warm and dry.  Sacral ulcers not examined today Psychiatric: pleasant and cooperative  Assessment/Plan: 1. Functional deficits secondary to debility which require 3+ hours per day of interdisciplinary therapy in a comprehensive inpatient rehab setting.  Physiatrist is providing close team supervision and 24 hour management of active medical problems listed below.  Physiatrist and rehab team continue to assess barriers to discharge/monitor patient progress toward functional and medical goals  Care Tool:  Bathing    Body parts bathed by patient: Right arm, Chest, Abdomen, Right upper leg, Left upper  leg, Face, Front perineal area   Body parts bathed by helper: Left arm, Buttocks, Right lower leg, Left lower leg     Bathing assist Assist Level: Moderate Assistance - Patient 50 - 74%     Upper Body Dressing/Undressing Upper body dressing   What is the patient wearing?: Pull over shirt    Upper body assist Assist Level: Moderate Assistance - Patient 50 - 74%    Lower Body Dressing/Undressing Lower body dressing    Lower body dressing activity did not occur: Refused What is the patient wearing?: Incontinence brief, Pants     Lower body assist Assist for lower body dressing: Maximal Assistance - Patient 25 - 49%     Toileting Toileting    Toileting assist Assist for toileting: Total Assistance - Patient < 25%     Transfers Chair/bed transfer  Transfers assist     Chair/bed transfer assist level: Moderate Assistance - Patient 50 - 74%     Locomotion Ambulation   Ambulation assist   Ambulation activity did not occur: Safety/medical concerns(weakness)          Walk 10 feet activity   Assist           Walk 50 feet activity   Assist           Walk 150 feet activity   Assist Walk 150 feet activity did not occur: Safety/medical concerns(weakness)         Walk 10 feet on uneven surface  activity   Assist           Wheelchair     Assist Will patient use wheelchair at discharge?: Yes  Type of Wheelchair: Manual    Wheelchair assist level: Supervision/Verbal cueing Max wheelchair distance: 41'    Wheelchair 50 feet with 2 turns activity    Assist    Wheelchair 50 feet with 2 turns activity did not occur: Safety/medical concerns(weakness)       Wheelchair 150 feet activity     Assist Wheelchair 150 feet activity did not occur: Safety/medical concerns(weakness)          Medical Problem List and Plan: 1.  Debility secondary to acute renal failure/ascites/multi-medical  Continue CIR 2.  Antithrombotics:    Right brachiocephalic DVT 08/27/3788: Eliquis             -antiplatelet therapy: N/A 3. Pain Management: Hydrocodone as needed 4. Mood: Provide emotional support             -antipsychotic agents: N/A 5. Neuropsych: This patient is capable of making decisions on his own behalf. 6. Skin/Wound Care/multiple decubiti sacrum/heels.:  Prevalon boots and follow-up wound care nurse routine skin checks 7. Fluids/Electrolytes/Nutrition: Routine in and outs.   8.  GI bleed.  EGD 01/21/2019.  Follow-up gastroenterology recommendations.    Hemoglobin 7.9 on 6/29, labs not performed with HD, ordered for Monday  Added simethicone for gas 9.    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  Status post left brachiocephalic AV fistula.  Hemodialysis Tuesday Thursday Saturday.  Recommendation per nephro.  Hepatitis B neg 10.  Plasma cell myeloma.  Outpatient follow-up with oncology to discuss formal chemotherapy.  Appreciate oncology recommendations  Recs per oncology, no changes at present 11.  Right pleural effusion.  Status post thoracentesis 01/16/2019. 12.  Orthostasis.  ProAmatine 10 mg 3 times daily.    Labile on 7/3 with hypotension  Monitor with increased mobility 13.  Episodic SVT.  8 beats noted 02/08/2019.  Amiodarone as directed.    Controlled on 7/3  Monitor with increased mobility 13.  History of CVA with recurrent right hemiparesis. 14.  Steroid-induced hyperglycemia  Elevated on 6/28, continue to monitor  LOS: 9 days A FACE TO FACE EVALUATION WAS PERFORMED  Scott Mcdowell Scott Mcdowell 02/19/2019, 10:03 AM

## 2019-02-19 NOTE — Progress Notes (Signed)
Occupational Therapy Session Note  Patient Details  Name: Scott Mcdowell MRN: [id number] Date of Birth: October 24, 1940  Today's Date: 02/19/2019 OT Individual Time: 0700-0800 OT Individual Time Calculation (min): 60 min    Short Term Goals: Week 2:  OT Short Term Goal 1 (Week 2): Pt will complete 1 grooming task in standing for increased standing tolerance OT Short Term Goal 2 (Week 2): Pt will complete bathing with mod assist at sit > stand level OT Short Term Goal 3 (Week 2): Pt will complete UB dressing with min A OT Short Term Goal 4 (Week 2): Pt will complete LB dressing with mod A  Skilled Therapeutic Interventions/Progress Updates:    OT intervention with focus on bed mobility, sitting balance, sit<>stand, standing balance, BADL retraining, and activity tolerance to increase independence with BADLs. Pt required min A for supine>sit EOB.  Pt with improved hip flexion and knee extension as noted during LB dressing tasks. Pt able to reposition at EOB at supervison level.  Pt performed sit<>stand from EOB with Stedy X 2 (mod A) for LB dressing tasks. Pt completed UB bathing/dressing tasks seated in w/c at sink.  Pt with improved RUE grasp and able to hold deodorant when applying. Pt with improved RUE active movement proximal>distal. Pt remained seated in TIS w/c with all needs within reach and belt alarm activated.   Therapy Documentation Precautions:  Precautions Precautions: Fall Precaution Comments: R hemiparesis, h/o 1 fall in past 1 year; hemiparesis more apparent due to debility Restrictions Weight Bearing Restrictions: No Pain:  Pt c/o stiffness in lower back relieved with activity   Therapy/Group: Individual Therapy  [name] 02/19/2019, 8:01 AM

## 2019-02-19 NOTE — Progress Notes (Signed)
[facility] NEPHROLOGY PROGRESS NOTE  Assessment/ Plan: Pt is a 78 y.o. yo male with myeloma presented with pneumonia complicated by AKI requiring dialysis.  Patient had respiratory failure requiring intubation, acute blood loss anemia and upper GI bleed.  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  He has TDC and AV fistula.  -He has right IJ TDC and left brachiocephalic AV fistula. -He has a outpatient spot for TTS at [facility] at 12:15. -Status post dialysis yesterday with UF.  Tolerated well.  Feels good.  Plan for next HD tomorrow.  Check lab in the morning.  # Anemia due to CKD: Received Feraheme on 6/15 and then 4 doses of Ferrlecit started on 6/27.  Continue ESA.  Monitor hemoglobin.  # Secondary hyperparathyroidism: Phosphorus level acceptable. PTH 40.  Continue Renvela.  # Hypotension/volume: Continue midodrine to 15 mg 3 times daily.  Monitor blood pressure.  #Right upper extremity DVT: On Eliquis per primary team.  #Plasma cell myeloma per oncology, on a steroid.   Subjective: Seen and examined at rehab.  Doing well.  Denies nausea vomiting chest pain shortness of breath.  Feels good.  Objective Vital signs in last 24 hours: Vitals:   02/18/19 1630 02/18/19 1701 02/18/19 1948 02/19/19 0408  BP: (!) 104/55 (!) 95/45 (!) 145/56 (!) 125/52  Pulse: 68 70 85 89  Resp:  18 19 16   Temp:  98 F (36.7 C) 98.3 F (36.8 C) 98.3 F (36.8 C)  TempSrc:  Oral Oral Oral  SpO2:  98% 98% 96%  Weight:  106 kg    Height:       Weight change: 0.044 kg  Intake/Output Summary (Last 24 hours) at 02/19/2019 1344 Last data filed at 02/19/2019 1321 Gross per 24 hour  Intake 340 ml  Output 2375 ml  Net -2035 ml       Labs: Basic Metabolic Panel: Recent Labs  Lab 02/13/19 1621 02/15/19 1646  NA 137 138  K 4.7 4.5  CL 98 100  CO2 24 26  GLUCOSE 151* 119*  BUN 80* 96*  CREATININE 6.09* 6.21*  CALCIUM 9.2 8.9  PHOS 5.0* 4.6    Liver Function Tests: Recent Labs  Lab 02/13/19 1621 02/15/19 1646  ALBUMIN 3.8 3.7   No results for input(s): LIPASE, AMYLASE in the last 168 hours. No results for input(s): AMMONIA in the last 168 hours. CBC: Recent Labs  Lab 02/13/19 1621 02/15/19 1646  WBC 6.8 6.7  HGB 7.5* 7.9*  HCT 24.0* 25.1*  MCV 104.3* 104.1*  PLT 194 212   Cardiac Enzymes: No results for input(s): CKTOTAL, CKMB, CKMBINDEX, TROPONINI in the last 168 hours. CBG: No results for input(s): GLUCAP in the last 168 hours.  Iron Studies: No results for input(s): IRON, TIBC, TRANSFERRIN, FERRITIN in the last 72 hours. Studies/Results: No results found.  Medications: Infusions: . ferric gluconate (FERRLECIT/NULECIT) IV 125 mg (02/18/19 1609)    Scheduled Medications: . sodium chloride   Intravenous Once  . apixaban  5 mg Oral BID  . Chlorhexidine Gluconate Cloth  6 each Topical Q0600  . darbepoetin (ARANESP) injection - DIALYSIS  200 mcg Intravenous Q Thu-HD  . feeding supplement (NEPRO CARB STEADY)  237 mL Oral TID BM  . heparin  3,400 Units Intracatheter Once  . liver oil-zinc oxide   Topical 5 X Daily  . midodrine  15 mg Oral TID WC  . multivitamin  1 tablet Oral QHS  . nystatin   Topical  TID  . pantoprazole  40 mg Oral BID  . polyethylene glycol  17 g Oral Daily  . senna-docusate  1 tablet Oral QHS  . sevelamer carbonate  2,400 mg Oral TID WC  . simethicone  80 mg Oral TID    have reviewed scheduled and prn medications.  Physical Exam: General: NAD, comfortable Heart:RRR, s1s2 nl, no rubs Lungs: Clear bilateral, no crackle Abdomen:soft, Non-tender, non-distended Extremities: Lower extremity pitting edema and right upper arm edema.   Dialysis Access: Right IJ TDC and left upper extremity AV fistula with bruit and thrill.  Doloros Kwolek Prasad Aleenah Homen 02/19/2019,1:44 PM  LOS: 9 days  Pager: [phone]

## 2019-02-19 NOTE — Plan of Care (Signed)
  Problem: Consults Goal: RH GENERAL PATIENT EDUCATION Description: See Patient Education module for education specifics. Outcome: Progressing Goal: Skin Care Protocol Initiated - if Braden Score 18 or less Description: If consults are not indicated, leave blank or document N/A Outcome: Progressing   Problem: RH BOWEL ELIMINATION Goal: RH STG MANAGE BOWEL WITH ASSISTANCE Description: STG Manage Bowel with Assistance. Min Outcome: Progressing Goal: RH STG MANAGE BOWEL W/MEDICATION W/ASSISTANCE Description: STG Manage Bowel with Medication with Assistance. Min Outcome: Progressing   Problem: RH SAFETY Goal: RH STG ADHERE TO SAFETY PRECAUTIONS W/ASSISTANCE/DEVICE Description: STG Adhere to Safety Precautions With Assistance/Device. Mod assist. Outcome: Progressing   Problem: RH PAIN MANAGEMENT Goal: RH STG PAIN MANAGED AT OR BELOW PT'S PAIN GOAL Description: Pain level <3. Outcome: Progressing

## 2019-02-19 NOTE — Progress Notes (Signed)
Occupational Therapy Session Note  Patient Details  Name: Scott Mcdowell MRN: [id number] Date of Birth: Jul 19, 1941  Today's Date: 02/19/2019 OT Individual Time: 1000-1100 OT Individual Time Calculation (min): 60 min    Short Term Goals: Week 2:  OT Short Term Goal 1 (Week 2): Pt will complete 1 grooming task in standing for increased standing tolerance OT Short Term Goal 2 (Week 2): Pt will complete bathing with mod assist at sit > stand level OT Short Term Goal 3 (Week 2): Pt will complete UB dressing with min A OT Short Term Goal 4 (Week 2): Pt will complete LB dressing with mod A  Skilled Therapeutic Interventions/Progress Updates:    OT intervention with focus on sit<>stand, standing balance in Stedy, RUE NMR, sitting balance, and activity tolerance to increase independence with BADLs. Pt required mod A for sit<>stand in Stedy X 4 (use BSC and for hygiene and nursing care). Pt stood in [place] with BUE support for 4 mins.  Pt enegaged in RUE NMR (see below) with improved elbow flexion and horizontal adduction noted. Pt engaged in sit<>stand without Stedy X 3 with mod A from elevated mat. Pt returned to room and remained in TIS w/c with all needs within reach and belt alarm activated.   Therapy Documentation Precautions:  Precautions Precautions: Fall Precaution Comments: R hemiparesis, h/o 1 fall in past 1 year; hemiparesis more apparent due to debility Restrictions Weight Bearing Restrictions: No  Pain:  Pt denies discomfort   Other Treatments: Treatments Neuromuscular Facilitation: Right;Upper Extremity;Activity to increase motor control;Activity to increase timing and sequencing;Activity to increase grading;Activity to increase sustained activation;Activity to increase lateral weight shifting;Activity to increase anterior-posterior weight shifting Weight Bearing Technique Weight Bearing Technique: Yes RUE Weight Bearing Technique: Extended arm seated Response to Weight  Bearing Technique: increased RUE elbow flexion, R shoulder protraction/retraction, RUE pronation/supination   Therapy/Group: Individual Therapy  [name] 02/19/2019, 12:23 PM

## 2019-02-20 ENCOUNTER — Inpatient Hospital Stay (HOSPITAL_COMMUNITY): Payer: Medicare Other

## 2019-02-20 LAB — CBC
HCT: 27.7 % — ABNORMAL LOW (ref 39.0–52.0)
HCT: 27.9 % — ABNORMAL LOW (ref 39.0–52.0)
Hemoglobin: 8.7 g/dL — ABNORMAL LOW (ref 13.0–17.0)
Hemoglobin: 8.6 g/dL — ABNORMAL LOW (ref 13.0–17.0)
MCH: 32.5 pg (ref 26.0–34.0)
MCH: 32.7 pg (ref 26.0–34.0)
MCHC: 31.4 g/dL (ref 30.0–36.0)
MCHC: 30.8 g/dL (ref 30.0–36.0)
MCV: 103.4 fL — ABNORMAL HIGH (ref 80.0–100.0)
MCV: 106.1 fL — ABNORMAL HIGH (ref 80.0–100.0)
Platelets: 176 10*3/uL (ref 150–400)
Platelets: 178 10*3/uL (ref 150–400)
RBC: 2.68 MIL/uL — ABNORMAL LOW (ref 4.22–5.81)
RBC: 2.63 MIL/uL — ABNORMAL LOW (ref 4.22–5.81)
RDW: 19.6 % — ABNORMAL HIGH (ref 11.5–15.5)
RDW: 19.5 % — ABNORMAL HIGH (ref 11.5–15.5)
WBC: 5.6 10*3/uL (ref 4.0–10.5)
WBC: 5.4 10*3/uL (ref 4.0–10.5)
nRBC: 0 % (ref 0.0–0.2)
nRBC: 0 % (ref 0.0–0.2)

## 2019-02-20 LAB — RENAL FUNCTION PANEL
Albumin: 3.5 g/dL (ref 3.5–5.0)
Albumin: 3.4 g/dL — ABNORMAL LOW (ref 3.5–5.0)
Anion gap: 13 (ref 5–15)
Anion gap: 13 (ref 5–15)
BUN: 65 mg/dL — ABNORMAL HIGH (ref 8–23)
BUN: 67 mg/dL — ABNORMAL HIGH (ref 8–23)
CO2: 26 mmol/L (ref 22–32)
CO2: 25 mmol/L (ref 22–32)
Calcium: 8.9 mg/dL (ref 8.9–10.3)
Calcium: 8.7 mg/dL — ABNORMAL LOW (ref 8.9–10.3)
Chloride: 100 mmol/L (ref 98–111)
Chloride: 100 mmol/L (ref 98–111)
Creatinine, Ser: 5.25 mg/dL — ABNORMAL HIGH (ref 0.61–1.24)
Creatinine, Ser: 5.42 mg/dL — ABNORMAL HIGH (ref 0.61–1.24)
GFR calc Af Amer: 11 mL/min — ABNORMAL LOW (ref >60)
GFR calc Af Amer: 11 mL/min — ABNORMAL LOW (ref >60)
GFR calc non Af Amer: 10 mL/min — ABNORMAL LOW (ref >60)
GFR calc non Af Amer: 9 mL/min — ABNORMAL LOW (ref >60)
Glucose, Bld: 92 mg/dL (ref 70–99)
Glucose, Bld: 118 mg/dL — ABNORMAL HIGH (ref 70–99)
Phosphorus: 4 mg/dL (ref 2.5–4.6)
Phosphorus: 3.7 mg/dL (ref 2.5–4.6)
Potassium: 4.1 mmol/L (ref 3.5–5.1)
Potassium: 3.7 mmol/L (ref 3.5–5.1)
Sodium: 139 mmol/L (ref 135–145)
Sodium: 138 mmol/L (ref 135–145)

## 2019-02-20 MED ORDER — LIDOCAINE-PRILOCAINE 2.5-2.5 % EX CREA: 1.0000 "application " | TOPICAL_CREAM | CUTANEOUS | Status: DC | PRN

## 2019-02-20 MED ORDER — PENTAFLUOROPROP-TETRAFLUOROETH EX AERO: 1.0000 "application " | INHALATION_SPRAY | CUTANEOUS | Status: DC | PRN

## 2019-02-20 MED ORDER — HEPARIN SODIUM (PORCINE) 1000 UNIT/ML DIALYSIS: 1000.0000 [IU] | INTRAMUSCULAR | Status: DC | PRN

## 2019-02-20 MED ORDER — LIDOCAINE HCL (PF) 1 % IJ SOLN: 5.0000 mL | INTRAMUSCULAR | Status: DC | PRN

## 2019-02-20 MED ORDER — SODIUM CHLORIDE 0.9 % IV SOLN: 100.0000 mL | INTRAVENOUS | Status: DC | PRN

## 2019-02-20 MED ORDER — MIDODRINE HCL 5 MG PO TABS
ORAL_TABLET | ORAL | Status: AC
  Filled 2019-02-20: qty 3

## 2019-02-20 MED ORDER — ALTEPLASE 2 MG IJ SOLR: 2.0000 mg | Freq: Once | INTRAMUSCULAR | Status: DC | PRN

## 2019-02-20 MED ORDER — HEPARIN SODIUM (PORCINE) 1000 UNIT/ML IJ SOLN
INTRAMUSCULAR | Status: AC
  Filled 2019-02-20: qty 4

## 2019-02-20 NOTE — Progress Notes (Signed)
[facility] NEPHROLOGY PROGRESS NOTE  Assessment/ Plan: Pt is a 78 y.o. yo male with myeloma presented with pneumonia complicated by AKI requiring dialysis.  Patient had respiratory failure requiring intubation, acute blood loss anemia and upper GI bleed.  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  He has TDC and AV fistula.  -He has right IJ TDC and left brachiocephalic AV fistula. -He has outpatient spot for TTS at [facility] at 12:15. -plan for HD today. Attempt UF, will lower dialysate temp and use midodrine for intra-dialytic hypotension.   # Anemia due to CKD: Received Feraheme on 6/15 and then 4 doses of Ferrlecit started on 6/27.  Continue ESA.  Monitor hemoglobin.  # Secondary hyperparathyroidism: Phosphorus level acceptable. PTH 40.  Continue Renvela.  # Hypotension/volume: Continue midodrine to 15 mg 3 times daily.  Monitor blood pressure.  #Right upper extremity DVT: On Eliquis per primary team.  #Plasma cell myeloma per oncology, on a steroid.   Subjective: Seen and examined at rehab.  No new event. He feels good and denies chest pain, SOB, N/V, abdomen pain.   Objective Vital signs in last 24 hours: Vitals:   02/19/19 1355 02/19/19 2050 02/19/19 2054 02/20/19 0501  BP: (!) 109/55 (!) 150/58 114/61 (!) 121/48  Pulse: 83 85 85 85  Resp: 18 18  20   Temp: (!) 97.4 F (36.3 C) 97.9 F (36.6 C)  97.6 F (36.4 C)  TempSrc: Oral Oral  Oral  SpO2: 100% 97%  97%  Weight:    103 kg  Height:       Weight change: -5.034 kg  Intake/Output Summary (Last 24 hours) at 02/20/2019 1126 Last data filed at 02/20/2019 0912 Gross per 24 hour  Intake 520 ml  Output 475 ml  Net 45 ml       Labs: Basic Metabolic Panel: Recent Labs  Lab 02/13/19 1621 02/15/19 1646 02/20/19 0509  NA 137 138 139  K 4.7 4.5 4.1  CL 98 100 100  CO2 24 26 26   GLUCOSE 151* 119* 92  BUN 80* 96* 65*  CREATININE 6.09* 6.21* 5.25*  CALCIUM 9.2  8.9 8.9  PHOS 5.0* 4.6 4.0   Liver Function Tests: Recent Labs  Lab 02/13/19 1621 02/15/19 1646 02/20/19 0509  ALBUMIN 3.8 3.7 3.5   No results for input(s): LIPASE, AMYLASE in the last 168 hours. No results for input(s): AMMONIA in the last 168 hours. CBC: Recent Labs  Lab 02/13/19 1621 02/15/19 1646 02/20/19 0509  WBC 6.8 6.7 5.6  HGB 7.5* 7.9* 8.7*  HCT 24.0* 25.1* 27.7*  MCV 104.3* 104.1* 103.4*  PLT 194 212 176   Cardiac Enzymes: No results for input(s): CKTOTAL, CKMB, CKMBINDEX, TROPONINI in the last 168 hours. CBG: No results for input(s): GLUCAP in the last 168 hours.  Iron Studies: No results for input(s): IRON, TIBC, TRANSFERRIN, FERRITIN in the last 72 hours. Studies/Results: No results found.  Medications: Infusions: . ferric gluconate (FERRLECIT/NULECIT) IV 125 mg (02/18/19 1609)    Scheduled Medications: . sodium chloride   Intravenous Once  . apixaban  5 mg Oral BID  . Chlorhexidine Gluconate Cloth  6 each Topical Q0600  . Chlorhexidine Gluconate Cloth  6 each Topical Q0600  . darbepoetin (ARANESP) injection - DIALYSIS  200 mcg Intravenous Q Thu-HD  . feeding supplement (NEPRO CARB STEADY)  237 mL Oral TID BM  . heparin  3,400 Units Intracatheter Once  . liver oil-zinc oxide   Topical 5 X  Daily  . midodrine  15 mg Oral TID WC  . multivitamin  1 tablet Oral QHS  . nystatin   Topical TID  . pantoprazole  40 mg Oral BID  . polyethylene glycol  17 g Oral Daily  . senna-docusate  1 tablet Oral QHS  . sevelamer carbonate  2,400 mg Oral TID WC  . simethicone  80 mg Oral TID    have reviewed scheduled and prn medications.  Physical Exam: General: NAD, comfortable Heart:RRR, s1s2 nl, no rubs Lungs:clear bilateral, no crackle Abdomen:soft, Non-tender, non-distended Extremities: LE edema ++, improved UE edema.   Dialysis Access: Right IJ TDC and left upper extremity AV fistula with bruit and thrill.   Tanna Furry 02/20/2019,11:26 AM  LOS:  10 days  Pager: [phone]

## 2019-02-20 NOTE — Plan of Care (Signed)
  Problem: Consults Goal: RH GENERAL PATIENT EDUCATION Description: See Patient Education module for education specifics. Outcome: Progressing Goal: Skin Care Protocol Initiated - if Braden Score 18 or less Description: If consults are not indicated, leave blank or document N/A Outcome: Progressing   Problem: RH BOWEL ELIMINATION Goal: RH STG MANAGE BOWEL WITH ASSISTANCE Description: STG Manage Bowel with Assistance. Min Outcome: Progressing Goal: RH STG MANAGE BOWEL W/MEDICATION W/ASSISTANCE Description: STG Manage Bowel with Medication with Assistance. Min Outcome: Progressing   Problem: RH SKIN INTEGRITY Goal: RH STG SKIN FREE OF INFECTION/BREAKDOWN Description: Skin free of infection & further breakdown. Mod assistance. Outcome: Progressing Goal: RH STG MAINTAIN SKIN INTEGRITY WITH ASSISTANCE Description: STG Maintain Skin Integrity With Assistance. Mod assist. Outcome: Progressing   Problem: RH SAFETY Goal: RH STG ADHERE TO SAFETY PRECAUTIONS W/ASSISTANCE/DEVICE Description: STG Adhere to Safety Precautions With Assistance/Device. Mod assist. Outcome: Progressing   Problem: RH PAIN MANAGEMENT Goal: RH STG PAIN MANAGED AT OR BELOW PT'S PAIN GOAL Description: Pain level <3. Outcome: Progressing

## 2019-02-20 NOTE — Discharge Instructions (Addendum)
Inpatient Rehab Discharge Instructions  [place] Discharge date and time: No discharge date for patient encounter.   Activities/Precautions/ Functional Status: Activity: activity as tolerated Diet: renal diet Wound Care: none needed Functional status:  ___ No restrictions     ___ Walk up steps independently ___ 24/7 supervision/assistance   ___ Walk up steps with assistance ___ Intermittent supervision/assistance  ___ Bathe/dress independently ___ Walk with walker     _x__ Bathe/dress with assistance ___ Walk Independently    ___ Shower independently ___ Walk with assistance    ___ Shower with assistance ___ No alcohol     ___ Return to work/school ________  Special Instructions: No smoking drinking or alcohol  Continue dialysis as directed    COMMUNITY REFERRALS UPON DISCHARGE:    Home Health:   PT, OT, RN  Agency:[facility]   [phone]   Date of last service:03/05/2019  Medical Equipment/Items Ordered:[facility] [place], [place]  Agency/Supplier:[facility]  [phone]  Other:[facility] IN [place] Tuesday, Thursday & Saturday-TIME SLOT-6:45 AM BE THERE 15 MINUTES EARLY WILL NEED TO GO TO CLINIC ON Friday TO SIGN PAPERWORK TO BEGIN SATURDAY  My questions have been answered and I understand these instructions. I will adhere to these goals and the provided educational materials after my discharge from the hospital.  Patient/Caregiver Signature _______________________________ Date __________  Clinician Signature _______________________________________ Date __________  Please bring this form and your medication list with you to all your follow-up doctor's appointments.    Information on my medicine - ELIQUIS (apixaban)  Why was Eliquis prescribed for you? Eliquis was prescribed to treat blood clots that may have been found in the veins of your arm (deep vein thrombosis) and to reduce the risk of them  occurring again.  What do You need to know about Eliquis ? The starting dose is 10 mg (two 5 mg tablets) taken TWICE daily for the FIRST SEVEN (7) DAYS, then on 02/12/2019, the dose was reduced to ONE 5 mg tablet taken TWICE daily.  Eliquis may be taken with or without food.   Try to take the dose about the same time in the morning and in the evening. If you have difficulty swallowing the tablet whole please discuss with your pharmacist how to take the medication safely.  Take Eliquis exactly as prescribed and DO NOT stop taking Eliquis without talking to the doctor who prescribed the medication.  Stopping may increase your risk of developing a new blood clot.  Refill your prescription before you run out.  After discharge, you should have regular check-up appointments with your healthcare provider that is prescribing your Eliquis.    What do you do if you miss a dose? If a dose of ELIQUIS is not taken at the scheduled time, take it as soon as possible on the same day and twice-daily administration should be resumed. The dose should not be doubled to make up for a missed dose.  Important Safety Information A possible side effect of Eliquis is bleeding. You should call your healthcare provider right away if you experience any of the following: ? Bleeding from an injury or your nose that does not stop. ? Unusual colored urine (red or dark brown) or unusual colored stools (red or black). ? Unusual bruising for unknown reasons. ? A serious fall or if you hit your head (even if there is no bleeding).  Some medicines may interact with Eliquis and might increase your risk of bleeding or clotting  while on Eliquis. To help avoid this, consult your healthcare provider or pharmacist prior to using any new prescription or non-prescription medications, including herbals, vitamins, non-steroidal anti-inflammatory drugs (NSAIDs) and supplements.  This website has more information on Eliquis  (apixaban): http://www.eliquis.com/eliquis/home

## 2019-02-20 NOTE — Progress Notes (Signed)
Occupational Therapy Session Note  Patient Details  Name: Scott Mcdowell MRN: [id number] Date of Birth: May 07, 1941  Today's Date: 02/20/2019 OT Individual Time: 0915-1030 OT Individual Time Calculation (min): 75 min    Short Term Goals: Week 2:  OT Short Term Goal 1 (Week 2): Pt will complete 1 grooming task in standing for increased standing tolerance OT Short Term Goal 2 (Week 2): Pt will complete bathing with mod assist at sit > stand level OT Short Term Goal 3 (Week 2): Pt will complete UB dressing with min A OT Short Term Goal 4 (Week 2): Pt will complete LB dressing with mod A  Skilled Therapeutic Interventions/Progress Updates:    Pt resting in bed upon arrival and agreeable to participating in therapy.  OT intervention with focus on bed mobility, sit<>stand with Stedy, standing balance in Stedy, unsupported sitting balance EOB, BADL retraining, activity tolernace, RUE functoinal tasks, and safety awareness to increase independence with BADLs. Supine>sit EOB with min A Sitting balance EOB in preparation for sit<>stand and transfer with Stedy with supervison. Pt able to extend R knee to facilitate donning on Ted Hose and sock Sit<>stand in [place] X 4 for transfer and LB dressing with mod A UB dressing seated in w/c with mod A Bathing seated EOB and w/c with mod A Pt engaged in RUE tasks with focus on elbow flexion, shoulder protraction, and RUE supination/pronation with gravity eliminated while seated in w/c Pt remained seated in w/c with all needs within reach and belt alarm activated.   Therapy Documentation Precautions:  Precautions Precautions: Fall Precaution Comments: R hemiparesis, h/o 1 fall in past 1 year; hemiparesis more apparent due to debility Restrictions Weight Bearing Restrictions: No  Pain: Pain Assessment Pain Scale: 0-10 Pain Score: 0-No pain   Therapy/Group: Individual Therapy  [name] 02/20/2019, 11:15 AM

## 2019-02-20 NOTE — Progress Notes (Signed)
[place] PHYSICAL MEDICINE & REHABILITATION PROGRESS NOTE  Subjective/Complaints: Patient seen sitting up in bed this morning.  He states he slept well overnight.  He states he feels very good this morning.  He was seen by nephrology yesterday, notes reviewed.  ROS: Denies CP, SOB, N/V/D  Objective: Vital Signs: Blood pressure (!) 121/48, pulse 85, temperature 97.6 F (36.4 C), temperature source Oral, resp. rate 20, height 6\' 2"  (1.88 m), weight 103 kg, SpO2 97 %. No results found. Recent Labs    02/20/19 0509  WBC 5.6  HGB 8.7*  HCT 27.7*  PLT 176   Recent Labs    02/20/19 0509  NA 139  K 4.1  CL 100  CO2 26  GLUCOSE 92  BUN 65*  CREATININE 5.25*  CALCIUM 8.9    Physical Exam: BP (!) 121/48 (BP Location: Left Leg)   Pulse 85   Temp 97.6 F (36.4 C) (Oral)   Resp 20   Ht 6\' 2"  (1.88 m)   Wt 103 kg   SpO2 97%   BMI 29.15 kg/m  Constitutional: No distress . Vital signs reviewed. HENT: Normocephalic.  Atraumatic. Eyes: EOMI.  No discharge. Cardiovascular: No JVD. Respiratory: Normal effort. GI: Non-distended. Musc: No edema or tenderness in extremities. Musc: Generalized edema, improving Neurological: Alert Follows commands Motor:  Right upper extremity: 3-/5 proximal distal, unchanged Right lower extremity: 3+-4-/5 proximal to distal Skin: Skin is warm and dry.  Sacral ulcers not examined today Psychiatric: pleasant and cooperative  Assessment/Plan: 1. Functional deficits secondary to debility which require 3+ hours per day of interdisciplinary therapy in a comprehensive inpatient rehab setting.  Physiatrist is providing close team supervision and 24 hour management of active medical problems listed below.  Physiatrist and rehab team continue to assess barriers to discharge/monitor patient progress toward functional and medical goals  Care Tool:  Bathing    Body parts bathed by patient: Right arm, Chest, Abdomen, Right upper leg, Left upper  leg, Face, Front perineal area   Body parts bathed by helper: Left arm, Buttocks, Right lower leg, Left lower leg     Bathing assist Assist Level: Moderate Assistance - Patient 50 - 74%     Upper Body Dressing/Undressing Upper body dressing   What is the patient wearing?: Pull over shirt    Upper body assist Assist Level: Moderate Assistance - Patient 50 - 74%    Lower Body Dressing/Undressing Lower body dressing    Lower body dressing activity did not occur: Refused What is the patient wearing?: Incontinence brief, Pants     Lower body assist Assist for lower body dressing: Maximal Assistance - Patient 25 - 49%     Toileting Toileting    Toileting assist Assist for toileting: Total Assistance - Patient < 25%     Transfers Chair/bed transfer  Transfers assist     Chair/bed transfer assist level: Total Assistance - Patient < 25%(with Stedy)     Locomotion Ambulation   Ambulation assist   Ambulation activity did not occur: Safety/medical concerns(weakness)          Walk 10 feet activity   Assist           Walk 50 feet activity   Assist           Walk 150 feet activity   Assist Walk 150 feet activity did not occur: Safety/medical concerns(weakness)         Walk 10 feet on uneven surface  activity   Assist  Wheelchair     Assist Will patient use wheelchair at discharge?: Yes Type of Wheelchair: Manual    Wheelchair assist level: Minimal Assistance - Patient > 75% Max wheelchair distance: 20'    Wheelchair 50 feet with 2 turns activity    Assist    Wheelchair 50 feet with 2 turns activity did not occur: Safety/medical concerns(weakness)       Wheelchair 150 feet activity     Assist Wheelchair 150 feet activity did not occur: Safety/medical concerns(weakness)          Medical Problem List and Plan: 1.  Debility secondary to acute renal failure/ascites/multi-medical  Continue CIR 2.   Antithrombotics:   Right brachiocephalic DVT 08/25/735: Eliquis             -antiplatelet therapy: N/A 3. Pain Management: Hydrocodone as needed 4. Mood: Provide emotional support             -antipsychotic agents: N/A 5. Neuropsych: This patient is capable of making decisions on his own behalf. 6. Skin/Wound Care/multiple decubiti sacrum/heels.:  Prevalon boots and follow-up wound care nurse routine skin checks 7. Fluids/Electrolytes/Nutrition: Routine in and outs.   8.  GI bleed.  EGD 01/21/2019.  Follow-up gastroenterology recommendations.    Hemoglobin 8.7 on 7/3, labs ordered for Monday  Added simethicone for gas 9.    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  Status post left brachiocephalic AV fistula.  Hemodialysis Tuesday Thursday Saturday.  Recommendation per nephro.  Hepatitis B neg 10.  Plasma cell myeloma.  Outpatient follow-up with oncology to discuss formal chemotherapy.  Appreciate oncology recommendations  Recs per oncology, no changes at present 11.  Right pleural effusion.  Status post thoracentesis 01/16/2019. 12.  Orthostasis.  ProAmatine 10 mg 3 times daily.    Controlled on 7/4  Monitor with increased mobility 13.  Episodic SVT.  8 beats noted 02/08/2019.  Amiodarone as directed.    Controlled on 7/4  Monitor with increased mobility 13.  History of CVA with recurrent right hemiparesis. 14.  Steroid-induced hyperglycemia  Elevated on 6/28, continue to monitor  LOS: 10 days A FACE TO FACE EVALUATION WAS PERFORMED   Lorie Phenix 02/20/2019, 10:24 AM

## 2019-02-21 ENCOUNTER — Inpatient Hospital Stay (HOSPITAL_COMMUNITY): Payer: Medicare Other

## 2019-02-21 NOTE — Progress Notes (Signed)
[place] PHYSICAL MEDICINE & REHABILITATION PROGRESS NOTE  Subjective/Complaints: Patient seen laying in bed this morning.  He states he slept well overnight.  He states he is looking forward to his day of rest.  He was seen by nephrology yesterday, notes reviewed.  ROS: Denies CP, SOB, N/V/D  Objective: Vital Signs: Blood pressure (!) 106/59, pulse 91, temperature 97.7 F (36.5 C), resp. rate 20, height 6\' 2"  (1.88 m), weight 105.7 kg, SpO2 97 %. No results found. Recent Labs    02/20/19 0509 02/20/19 1600  WBC 5.6 5.4  HGB 8.7* 8.6*  HCT 27.7* 27.9*  PLT 176 178   Recent Labs    02/20/19 0509 02/20/19 1600  NA 139 138  K 4.1 3.7  CL 100 100  CO2 26 25  GLUCOSE 92 118*  BUN 65* 67*  CREATININE 5.25* 5.42*  CALCIUM 8.9 8.7*    Physical Exam: BP (!) 106/59 (BP Location: Left Leg)   Pulse 91   Temp 97.7 F (36.5 C)   Resp 20   Ht 6\' 2"  (1.88 m)   Wt 105.7 kg   SpO2 97%   BMI 29.92 kg/m  Constitutional: No distress . Vital signs reviewed. HENT: Normocephalic.  Atraumatic. Eyes: EOMI.  No discharge. Cardiovascular: No JVD. Respiratory: Normal effort. GI: Non-distended. Musc: No edema or tenderness in extremities. Musc: Generalized edema, stable Neurological: Alert Follows commands Motor:  Right upper extremity: 3-/5 proximal distal, stable Right lower extremity: 3+-4-/5 proximal to distal, improving Skin: Skin is warm and dry.  Sacral ulcers not examined today Psychiatric: pleasant and cooperative  Assessment/Plan: 1. Functional deficits secondary to debility which require 3+ hours per day of interdisciplinary therapy in a comprehensive inpatient rehab setting.  Physiatrist is providing close team supervision and 24 hour management of active medical problems listed below.  Physiatrist and rehab team continue to assess barriers to discharge/monitor patient progress toward functional and medical goals  Care Tool:  Bathing    Body parts bathed by  patient: Right arm, Chest, Abdomen, Right upper leg, Left upper leg, Face, Front perineal area   Body parts bathed by helper: Left arm, Buttocks, Right lower leg, Left lower leg     Bathing assist Assist Level: Moderate Assistance - Patient 50 - 74%     Upper Body Dressing/Undressing Upper body dressing   What is the patient wearing?: Pull over shirt    Upper body assist Assist Level: Moderate Assistance - Patient 50 - 74%    Lower Body Dressing/Undressing Lower body dressing    Lower body dressing activity did not occur: Refused What is the patient wearing?: Pants, Incontinence brief     Lower body assist Assist for lower body dressing: Maximal Assistance - Patient 25 - 49%     Toileting Toileting    Toileting assist Assist for toileting: Total Assistance - Patient < 25%     Transfers Chair/bed transfer  Transfers assist     Chair/bed transfer assist level: Total Assistance - Patient < 25%     Locomotion Ambulation   Ambulation assist   Ambulation activity did not occur: Safety/medical concerns(weakness)          Walk 10 feet activity   Assist           Walk 50 feet activity   Assist           Walk 150 feet activity   Assist Walk 150 feet activity did not occur: Safety/medical concerns(weakness)  Walk 10 feet on uneven surface  activity   Assist           Wheelchair     Assist Will patient use wheelchair at discharge?: Yes Type of Wheelchair: Manual    Wheelchair assist level: Minimal Assistance - Patient > 75% Max wheelchair distance: 20'    Wheelchair 50 feet with 2 turns activity    Assist    Wheelchair 50 feet with 2 turns activity did not occur: Safety/medical concerns(weakness)       Wheelchair 150 feet activity     Assist Wheelchair 150 feet activity did not occur: Safety/medical concerns(weakness)          Medical Problem List and Plan: 1.  Debility secondary to acute renal  failure/ascites/multi-medical  Continue CIR 2.  Antithrombotics:   Right brachiocephalic DVT 11/18/1029: Eliquis             -antiplatelet therapy: N/A 3. Pain Management: Hydrocodone as needed 4. Mood: Provide emotional support             -antipsychotic agents: N/A 5. Neuropsych: This patient is capable of making decisions on his own behalf. 6. Skin/Wound Care/multiple decubiti sacrum/heels.:  Prevalon boots and follow-up wound care nurse routine skin checks 7. Fluids/Electrolytes/Nutrition: Routine in and outs.   8.  GI bleed.  EGD 01/21/2019.  Follow-up gastroenterology recommendations.    Hemoglobin 8.6 on 7/4  Added simethicone for gas 9.    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  Status post left brachiocephalic AV fistula.  Hemodialysis Tuesday Thursday Saturday.  Recommendation per nephro.  Hepatitis B neg 10.  Plasma cell myeloma.  Outpatient follow-up with oncology to discuss formal chemotherapy.  Appreciate oncology recommendations  Recs per oncology, no changes at present 11.  Right pleural effusion.  Status post thoracentesis 01/16/2019. 12.  Orthostasis.  ProAmatine 10 mg 3 times daily.    Labile on 7/5  Monitor with increased mobility 13.  Episodic SVT.  8 beats noted 02/08/2019.  Amiodarone as directed.    Controlled on 1/5  Monitor with increased mobility 13.  History of CVA with recurrent right hemiparesis. 14.  Steroid-induced hyperglycemia  Slightly elevated on 7/4  LOS: 11 days A FACE TO FACE EVALUATION WAS PERFORMED  Scott Mcdowell 02/21/2019, 10:04 AM

## 2019-02-21 NOTE — Progress Notes (Signed)
[facility] NEPHROLOGY PROGRESS NOTE  Assessment/ Plan: Pt is a 78 y.o. yo male with myeloma presented with pneumonia complicated by AKI requiring dialysis.  Patient had respiratory failure requiring intubation, acute blood loss anemia and upper GI bleed.  #Acute kidney injury now dialysis dependent and  progressed to ESRD.  He has TDC and AV fistula.  -He has right IJ TDC and left brachiocephalic AV fistula. -He has outpatient spot for TTS at [facility] at 12:15. -Status post HD yesterday with 3 L UF.  He feels much better.  Plan for next dialysis on Tuesday.  He is on midodrine and may need to lower dialysate temperature for intradialytic hypotension.  # Anemia due to CKD: Received Feraheme on 6/15 and then 4 doses of Ferrlecit started on 6/27.  Continue ESA.  Monitor hemoglobin.  # Secondary hyperparathyroidism: Phosphorus level acceptable. PTH 40.  Continue Renvela.  # Hypotension/volume: Continue midodrine to 15 mg 3 times daily.  Monitor blood pressure.  #Right upper extremity DVT: On Eliquis per primary team.  #Plasma cell myeloma per oncology, on a steroid.   Subjective: Seen and examined at rehab.  He feels much better.  Denied any, dizziness, nausea vomiting chest pain shortness of breath.  Edema is gradually improving.  Objective Vital signs in last 24 hours: Vitals:   02/20/19 1751 02/20/19 1847 02/20/19 1957 02/21/19 0534  BP: (!) 122/55 (!) 115/59 136/69 (!) 106/59  Pulse: 85 91 88 91  Resp: 18 18 20 20   Temp: 97.7 F (36.5 C) 98.4 F (36.9 C) 98.8 F (37.1 C) 97.7 F (36.5 C)  TempSrc: Oral  Oral   SpO2: 98% 99% 98% 97%  Weight: 100.5 kg   105.7 kg  Height:       Weight change: 0.534 kg  Intake/Output Summary (Last 24 hours) at 02/21/2019 1410 Last data filed at 02/21/2019 1355 Gross per 24 hour  Intake 120 ml  Output 3000 ml  Net -2880 ml       Labs: Basic Metabolic Panel: Recent Labs  Lab 02/15/19 1646 02/20/19 0509 02/20/19 1600   NA 138 139 138  K 4.5 4.1 3.7  CL 100 100 100  CO2 26 26 25   GLUCOSE 119* 92 118*  BUN 96* 65* 67*  CREATININE 6.21* 5.25* 5.42*  CALCIUM 8.9 8.9 8.7*  PHOS 4.6 4.0 3.7   Liver Function Tests: Recent Labs  Lab 02/15/19 1646 02/20/19 0509 02/20/19 1600  ALBUMIN 3.7 3.5 3.4*   No results for input(s): LIPASE, AMYLASE in the last 168 hours. No results for input(s): AMMONIA in the last 168 hours. CBC: Recent Labs  Lab 02/15/19 1646 02/20/19 0509 02/20/19 1600  WBC 6.7 5.6 5.4  HGB 7.9* 8.7* 8.6*  HCT 25.1* 27.7* 27.9*  MCV 104.1* 103.4* 106.1*  PLT 212 176 178   Cardiac Enzymes: No results for input(s): CKTOTAL, CKMB, CKMBINDEX, TROPONINI in the last 168 hours. CBG: No results for input(s): GLUCAP in the last 168 hours.  Iron Studies: No results for input(s): IRON, TIBC, TRANSFERRIN, FERRITIN in the last 72 hours. Studies/Results: No results found.  Medications: Infusions:   Scheduled Medications: . sodium chloride   Intravenous Once  . apixaban  5 mg Oral BID  . Chlorhexidine Gluconate Cloth  6 each Topical Q0600  . Chlorhexidine Gluconate Cloth  6 each Topical Q0600  . darbepoetin (ARANESP) injection - DIALYSIS  200 mcg Intravenous Q Thu-HD  . feeding supplement (NEPRO CARB STEADY)  237 mL Oral TID BM  .  liver oil-zinc oxide   Topical 5 X Daily  . midodrine  15 mg Oral TID WC  . multivitamin  1 tablet Oral QHS  . nystatin   Topical TID  . pantoprazole  40 mg Oral BID  . polyethylene glycol  17 g Oral Daily  . senna-docusate  1 tablet Oral QHS  . sevelamer carbonate  2,400 mg Oral TID WC  . simethicone  80 mg Oral TID    have reviewed scheduled and prn medications.  Physical Exam: General: NAD, comfortable Heart:RRR, s1s2 nl, no rubs Lungs:clear bilateral, no crackle Abdomen:soft, Non-tender, non-distended Extremities: LE edema UE edema is improving. Dialysis Access: Right IJ TDC and left upper extremity AV fistula with bruit and thrill.  [place]ron  Prasad Bhandari 02/21/2019,2:10 PM  LOS: 11 days  Pager: [phone]

## 2019-02-21 NOTE — Progress Notes (Signed)
Physical Therapy Session Note  Patient Details  Name: Scott Mcdowell MRN: [id number] Date of Birth: 08-31-1940  Today's Date: 02/21/2019 PT Individual Time: 1300-1345 PT Individual Time Calculation (min): 45 min   Short Term Goals: Week 2:  PT Short Term Goal 1 (Week 2): Patient will perform rolling in the bed and supine to sit with CGA. PT Short Term Goal 2 (Week 2): Patient will perform sit to supine with min A. PT Short Term Goal 3 (Week 2): Patient will perform level slide board transfers consistently with mod A of 1 person. PT Short Term Goal 4 (Week 2): Patient will initiate gait training.  Skilled Therapeutic Interventions/Progress Updates:    Patient in supine reports fatigued from toileting recently with staff assist.  Stood in [place] a long time waiting for them to assist wiwth hygiene.  Patient supine to sit max A for L LE and lifting trunk.  Sit to stand to [facility] with elevated bed mod to max A.  Transfer to w/c with Stedy.  Propelled w/c about 63' with hemi technique L UE/LE with cues and time limited by fatigue.  Assist to gym and in parallel bars performed sit<>stand x 2 with mod to max A.  Standing lateral weight shifts x 10 with A for L knee control and pt moving L foot forward and back x 2.  In w/c assisted to room and left up in chair with alarm belt activated and call bell in reach.   Therapy Documentation Precautions:  Precautions Precautions: Fall Precaution Comments: R hemiparesis, h/o 1 fall in past 1 year; hemiparesis more apparent due to debility Restrictions Weight Bearing Restrictions: No Pain: Pain Assessment Pain Score: 0-No pain   Therapy/Group: Individual Therapy  Reginia Naas  Magda Kiel, PT 02/21/2019, 5:01 PM

## 2019-02-22 ENCOUNTER — Inpatient Hospital Stay (HOSPITAL_COMMUNITY): Payer: Medicare Other

## 2019-02-22 DIAGNOSIS — R0989 Other specified symptoms and signs involving the circulatory and respiratory systems: Secondary | ICD-10-CM

## 2019-02-22 NOTE — Progress Notes (Signed)
Occupational Therapy Session Note  Patient Details  Name: Scott Mcdowell MRN: [id number] Date of Birth: 02-10-1941  Today's Date: 02/22/2019 OT Individual Time: 1400-1426 OT Individual Time Calculation (min): 26 min    Short Term Goals: Week 2:  OT Short Term Goal 1 (Week 2): Pt will complete 1 grooming task in standing for increased standing tolerance OT Short Term Goal 2 (Week 2): Pt will complete bathing with mod assist at sit > stand level OT Short Term Goal 3 (Week 2): Pt will complete UB dressing with min A OT Short Term Goal 4 (Week 2): Pt will complete LB dressing with mod A  Skilled Therapeutic Interventions/Progress Updates:    Pt resting in TIS w/c upon arrival, RUE partially off lap tray. Pt with increased R hand edema.  Ace wrap removed and increased edema noted proximally above elbow.  PA notified and attended to pt.  PA recommended removal of ace wrap and elevated support for RUE. Wrist mobilizations provided to improve fluid movement.  RUE elevated and supported by pillow and towel.  TIS placed at ~45 angle and pillow placed under lap tray on pt's knees. Pt commented he was more comfortable.  Slight decreased in edema during session. Pt remained in w/c with belt alarm activated and all needs within reach.  Therapy Documentation Precautions:  Precautions Precautions: Fall Precaution Comments: R hemiparesis, h/o 1 fall in past 1 year; hemiparesis more apparent due to debility Restrictions Weight Bearing Restrictions: No Pain:  Pt with no c/o pain this afternoon   Other Treatments: Manual Therapy Manual Therapy: Edema management   Therapy/Group: Individual Therapy  [name] 02/22/2019, 2:28 PM

## 2019-02-22 NOTE — Progress Notes (Signed)
Physical Therapy Session Note  Patient Details  Name: Scott Mcdowell MRN: [id number] Date of Birth: Apr 11, 1941  Today's Date: 02/22/2019 PT Individual Time: 1100-1230 PT Individual Time Calculation (min): 90 min   Short Term Goals: Week 2:  PT Short Term Goal 1 (Week 2): Patient will perform rolling in the bed and supine to sit with CGA. PT Short Term Goal 2 (Week 2): Patient will perform sit to supine with min A. PT Short Term Goal 3 (Week 2): Patient will perform level slide board transfers consistently with mod A of 1 person. PT Short Term Goal 4 (Week 2): Patient will initiate gait training.     Skilled Therapeutic Interventions/Progress Updates:  Pt resting in tilt in space w/c.  RUE edematous, with bracelet contsticting.  PT cut bracelet and rec-connected it to other bracelets on that UE  PT informed Akevia, LPN.  PT confimed with Pam, PA that wrapping with ACE is indicated.  PT wrapped RUE with 2 4" ACES.  Slide board transfer to L with min assist after board placed.  Pt retained principles of head/hips relationship with 1 cue.  Dynamic sitting balance: R/L lateral leans x 10.  Wedge under RUE for support.  Sit> stand from raised mat x 2, +2 with bil UE support on back of armchair in front of him..  In standing, mod/max assist and max cues for R knee control.  Sit> stand x 2 to EVA walker, +2.  In standing, mod/max assist for R stance stability, for 10 x 1 L LE stepping, 3 x 1 LLE stepping; 2nd bout limited by fatigue.  Sit> supine max assist to L.  Pillows used with pt in hook lying to support RUE.  With gait belt around bil thighs, 15 x 1 bil lower trunk rotation, 10 x 1 bil bridging, small excursion. With belt removed, 5 x 2 R heel slides - active flexion, active assistive extension for eccentric control.  Pt needed supine rest breaks between set, with cues for slowing breathing.  Rolling R with min assist.  R side lying> sit with mod assist.  Transfer as above to return to  w/c.  Pt is edematous RLE and R trunk.  At end of session, pt resting in tilt in space w/c with needs at hand.   PT asked Jacqlyn Larsen, CSW to contact family to bring in clothing and shoes with tread.     Therapy Documentation Precautions:  Precautions Precautions: Fall Precaution Comments: R hemiparesis, h/o 1 fall in past 1 year; hemiparesis more apparent due to debility Restrictions Weight Bearing Restrictions: No  Pain: Pain Assessment Pain Scale: 0-10 3/10 anterior R shoulder Pt stated that he slept poorly, and felt his R shoulder was not positioned correctly as he lay on his R side asleep last night.  Hemi positioning hand out posted above pt's bed.       Therapy/Group: Individual Therapy  Malakhai Beitler 02/22/2019, 12:52 PM

## 2019-02-22 NOTE — Progress Notes (Signed)
Occupational Therapy Session Note  Patient Details  Name: Scott Mcdowell MRN: [id number] Date of Birth: [birth year]  Today's Date: 02/22/2019 OT Individual Time: 0815-0900 OT Individual Time Calculation (min): 45 min    Short Term Goals: Week 2:  OT Short Term Goal 1 (Week 2): Pt will complete 1 grooming task in standing for increased standing tolerance OT Short Term Goal 2 (Week 2): Pt will complete bathing with mod assist at sit > stand level OT Short Term Goal 3 (Week 2): Pt will complete UB dressing with min A OT Short Term Goal 4 (Week 2): Pt will complete LB dressing with mod A  Skilled Therapeutic Interventions/Progress Updates:    Pt resting in bed upon arrival stating that he had a "rough" night and didn't rest well.  Pt leaning to his L onto LUE. Pt c/o increased L shoulder pain but relieved with repositioning. OT intervention with focus on bed mobility, sit<>stand in [place], BADL retraining, sitting balance, and activity tolerance to increase independence with BADLs.   Supine>sit EOB with min A Sit<>stand with Stedy- mod A +2  Pt requires tot A for LB dressing tasks and mod A for UB dressing tasks with disposable scubs.   Pt remained seated in TIS w/c with all needs within reach and belt alarm activated.   Therapy Documentation Precautions:  Precautions Precautions: Fall Precaution Comments: R hemiparesis, h/o 1 fall in past 1 year; hemiparesis more apparent due to debility Restrictions Weight Bearing Restrictions: No Pain:  Pt c/o increased pain in RUE/shoulder this morning but relieved with repositioning   Therapy/Group: Individual Therapy  [name] 02/22/2019, 9:03 AM

## 2019-02-22 NOTE — Progress Notes (Signed)
  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  He has TDC and AV fistula.  -He has right IJ TDC and left brachiocephalic AV fistula. -He has outpatient spot for TTS at [facility] at 12:15. -Status post HD 7/4 with 3 L UF.  He feels much better.  Plan for next dialysis on Tuesday.  He is on midodrine and may need to lower dialysate temperature for intradialytic hypotension.  # Anemia due to CKD: Received Feraheme on 6/15 and then 4 doses of Ferrlecit started on 6/27.  Continue ESA.  Monitor hemoglobin.  # Secondary hyperparathyroidism: Phosphorus level acceptable. PTH 40.  Continue Renvela.  # Hypotension/volume: Continue midodrine to 15 mg 3 times daily.  Monitor blood pressure.  #Right upper extremity DVT: On Eliquis per primary team.  #Plasma cell myeloma per oncology  Subjective:    Seen in room.  In between therapies, on the phone.     Objective:   BP (!) 152/81 (BP Location: Left Leg)   Pulse 90   Temp 98.3 F (36.8 C) (Oral)   Resp 16   Ht 6\' 2"  (1.88 m)   Wt 107.5 kg   SpO2 99%   BMI 30.43 kg/m   Physical Exam: Gen: older gentleman, NAD, sitting in wheelchair in room CVS:RRR Resp: clear Abd: slight abd wall edema Ext: 2+ LE edema  Labs: BMET Recent Labs  Lab 02/15/19 1646 02/20/19 0509 02/20/19 1600  NA 138 139 138  K 4.5 4.1 3.7  CL 100 100 100  CO2 26 26 25   GLUCOSE 119* 92 118*  BUN 96* 65* 67*  CREATININE 6.21* 5.25* 5.42*  CALCIUM 8.9 8.9 8.7*  PHOS 4.6 4.0 3.7   CBC Recent Labs  Lab 02/15/19 1646 02/20/19 0509 02/20/19 1600  WBC 6.7 5.6 5.4  HGB 7.9* 8.7* 8.6*  HCT 25.1* 27.7* 27.9*  MCV 104.1* 103.4* 106.1*  PLT 212 176 178    @IMGRELPRIORS @ Medications:    . sodium chloride   Intravenous Once  . apixaban  5 mg Oral BID  . Chlorhexidine Gluconate Cloth  6 each Topical Q0600  . Chlorhexidine Gluconate Cloth  6 each Topical Q0600  .  darbepoetin (ARANESP) injection - DIALYSIS  200 mcg Intravenous Q Thu-HD  . feeding supplement (NEPRO CARB STEADY)  237 mL Oral TID BM  . liver oil-zinc oxide   Topical 5 X Daily  . midodrine  15 mg Oral TID WC  . multivitamin  1 tablet Oral QHS  . nystatin   Topical TID  . pantoprazole  40 mg Oral BID  . polyethylene glycol  17 g Oral Daily  . senna-docusate  1 tablet Oral QHS  . sevelamer carbonate  2,400 mg Oral TID WC  . simethicone  80 mg Oral TID     Madelon Lips MD [facility] pgr [phone] 02/22/2019, 4:01 PM

## 2019-02-22 NOTE — Progress Notes (Addendum)
[place] PHYSICAL MEDICINE & REHABILITATION PROGRESS NOTE  Subjective/Complaints: Patient seen sitting up in a chair working with therapy this morning.  States he did not sleep well overnight.  Initially, he was excited about his day of rest yesterday, but today states that he was she had therapies. Discussed with therapies.   ROS: Denies CP, SOB, N/V/D  Objective: Vital Signs: Blood pressure (!) 114/59, pulse 93, temperature 98.3 F (36.8 C), temperature source Oral, resp. rate (!) 22, height 6\' 2"  (1.88 m), weight 107.5 kg, SpO2 95 %. No results found. Recent Labs    02/20/19 0509 02/20/19 1600  WBC 5.6 5.4  HGB 8.7* 8.6*  HCT 27.7* 27.9*  PLT 176 178   Recent Labs    02/20/19 0509 02/20/19 1600  NA 139 138  K 4.1 3.7  CL 100 100  CO2 26 25  GLUCOSE 92 118*  BUN 65* 67*  CREATININE 5.25* 5.42*  CALCIUM 8.9 8.7*    Physical Exam: BP (!) 114/59 (BP Location: Left Leg)   Pulse 93   Temp 98.3 F (36.8 C) (Oral)   Resp (!) 22   Ht 6\' 2"  (1.88 m)   Wt 107.5 kg   SpO2 95%   BMI 30.43 kg/m  Constitutional: No distress . Vital signs reviewed. HENT: Normocephalic.  Atraumatic. Eyes: EOMI.  No Discharge. Cardiovascular: No JVD. Respiratory: Normal effort. GI: Non-distended. Musc: No edema or tenderness in extremities. Musc: Generalized edema, unchanged Neurological: Alert Follows commands Motor:  Right upper extremity: 3-/5 proximal distal, unchanged Right lower extremity: 3+-4-/5 proximal to distal, unchanged Skin: Skin is warm and dry.  Sacral ulcers not examined today Psychiatric: pleasant and cooperative  Assessment/Plan: 1. Functional deficits secondary to debility which require 3+ hours per day of interdisciplinary therapy in a comprehensive inpatient rehab setting.  Physiatrist is providing close team supervision and 24 hour management of active medical problems listed below.  Physiatrist and rehab team continue to assess barriers to  discharge/monitor patient progress toward functional and medical goals  Care Tool:  Bathing    Body parts bathed by patient: Right arm, Chest, Abdomen, Right upper leg, Left upper leg, Face, Front perineal area   Body parts bathed by helper: Left arm, Buttocks, Right lower leg, Left lower leg     Bathing assist Assist Level: Moderate Assistance - Patient 50 - 74%     Upper Body Dressing/Undressing Upper body dressing   What is the patient wearing?: Pull over shirt    Upper body assist Assist Level: Moderate Assistance - Patient 50 - 74%    Lower Body Dressing/Undressing Lower body dressing    Lower body dressing activity did not occur: Refused What is the patient wearing?: Pants, Incontinence brief     Lower body assist Assist for lower body dressing: Maximal Assistance - Patient 25 - 49%     Toileting Toileting    Toileting assist Assist for toileting: Total Assistance - Patient < 25%     Transfers Chair/bed transfer  Transfers assist     Chair/bed transfer assist level: Dependent - mechanical lift     Locomotion Ambulation   Ambulation assist   Ambulation activity did not occur: Safety/medical concerns(weakness)          Walk 10 feet activity   Assist           Walk 50 feet activity   Assist           Walk 150 feet activity   Assist Walk 150 feet  activity did not occur: Safety/medical concerns(weakness)         Walk 10 feet on uneven surface  activity   Assist           Wheelchair     Assist Will patient use wheelchair at discharge?: Yes Type of Wheelchair: Manual    Wheelchair assist level: Supervision/Verbal cueing Max wheelchair distance: 51'    Wheelchair 50 feet with 2 turns activity    Assist    Wheelchair 50 feet with 2 turns activity did not occur: Safety/medical concerns(weakness)       Wheelchair 150 feet activity     Assist Wheelchair 150 feet activity did not occur: Safety/medical  concerns(weakness)          Medical Problem List and Plan: 1.  Debility secondary to acute renal failure/ascites/multi-medical  Continue CIR 2.  Antithrombotics:   Right brachiocephalic DVT 04/25/5882: Eliquis             -antiplatelet therapy: N/A 3. Pain Management: Hydrocodone as needed 4. Mood: Provide emotional support             -antipsychotic agents: N/A 5. Neuropsych: This patient is capable of making decisions on his own behalf. 6. Skin/Wound Care/multiple decubiti sacrum/heels.:  Prevalon boots and follow-up wound care nurse routine skin checks 7. Fluids/Electrolytes/Nutrition: Routine in and outs.   8.  GI bleed.  EGD 01/21/2019.  Follow-up gastroenterology recommendations.    Hemoglobin 8.6 on 7/4  Added simethicone for gas 9.    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  Status post left brachiocephalic AV fistula.  Hemodialysis Tuesday Thursday Saturday.  Recommendation per nephro.  Hepatitis B neg 10.  Plasma cell myeloma.  Outpatient follow-up with oncology to discuss formal chemotherapy.  Appreciate oncology recommendations  Recs per oncology, no changes at present 11.  Right pleural effusion.  Status post thoracentesis 01/16/2019. 12.  Orthostasis.  ProAmatine 10 mg 3 times daily.    Slightly labile on 7/6  Monitor with increased mobility 13.  Episodic SVT.  8 beats noted 02/08/2019.  Amiodarone as directed.    Controlled on 1/6  Monitor with increased mobility 13.  History of CVA with recurrent right hemiparesis. 14.  Steroid-induced hyperglycemia  Slightly elevated on 7/4  LOS: 12 days A FACE TO FACE EVALUATION WAS PERFORMED  Ankit Lorie Phenix 02/22/2019, 10:44 AM

## 2019-02-22 NOTE — Progress Notes (Signed)
Social Work Patient ID: Scott Mcdowell, male   DOB: 02-21-1941, 78 y.o.   MRN: [id number]  Have left a message for wife to discuss pt's need for a ramp at discharge. Will await her return call.

## 2019-02-23 ENCOUNTER — Inpatient Hospital Stay (HOSPITAL_COMMUNITY): Payer: Medicare Other

## 2019-02-23 ENCOUNTER — Inpatient Hospital Stay (HOSPITAL_COMMUNITY): Payer: Medicare Other | Admitting: *Deleted

## 2019-02-23 ENCOUNTER — Inpatient Hospital Stay (HOSPITAL_COMMUNITY): Payer: Medicare Other | Admitting: Occupational Therapy

## 2019-02-23 DIAGNOSIS — R609 Edema, unspecified: Secondary | ICD-10-CM

## 2019-02-23 DIAGNOSIS — I953 Hypotension of hemodialysis: Secondary | ICD-10-CM

## 2019-02-23 LAB — FUNGUS CULTURE WITH STAIN

## 2019-02-23 LAB — RENAL FUNCTION PANEL
Albumin: 3.6 g/dL (ref 3.5–5.0)
Anion gap: 14 (ref 5–15)
BUN: 66 mg/dL — ABNORMAL HIGH (ref 8–23)
CO2: 26 mmol/L (ref 22–32)
Calcium: 9.3 mg/dL (ref 8.9–10.3)
Chloride: 99 mmol/L (ref 98–111)
Creatinine, Ser: 5.76 mg/dL — ABNORMAL HIGH (ref 0.61–1.24)
GFR calc Af Amer: 10 mL/min — ABNORMAL LOW (ref >60)
GFR calc non Af Amer: 9 mL/min — ABNORMAL LOW (ref >60)
Glucose, Bld: 125 mg/dL — ABNORMAL HIGH (ref 70–99)
Phosphorus: 2.7 mg/dL (ref 2.5–4.6)
Potassium: 3.8 mmol/L (ref 3.5–5.1)
Sodium: 139 mmol/L (ref 135–145)

## 2019-02-23 LAB — CBC
HCT: 27.3 % — ABNORMAL LOW (ref 39.0–52.0)
Hemoglobin: 8.5 g/dL — ABNORMAL LOW (ref 13.0–17.0)
MCH: 33.1 pg (ref 26.0–34.0)
MCHC: 31.1 g/dL (ref 30.0–36.0)
MCV: 106.2 fL — ABNORMAL HIGH (ref 80.0–100.0)
Platelets: 205 10*3/uL (ref 150–400)
RBC: 2.57 MIL/uL — ABNORMAL LOW (ref 4.22–5.81)
RDW: 19.6 % — ABNORMAL HIGH (ref 11.5–15.5)
WBC: 6.1 10*3/uL (ref 4.0–10.5)
nRBC: 0 % (ref 0.0–0.2)

## 2019-02-23 LAB — FUNGAL ORGANISM REFLEX

## 2019-02-23 LAB — FUNGUS CULTURE RESULT

## 2019-02-23 MED ORDER — MIDODRINE HCL 5 MG PO TABS
ORAL_TABLET | ORAL | Status: AC
  Filled 2019-02-23: qty 3

## 2019-02-23 MED ORDER — HEPARIN SODIUM (PORCINE) 1000 UNIT/ML IJ SOLN
INTRAMUSCULAR | Status: AC
  Administered 2019-02-23: 1000 [IU]
  Filled 2019-02-23: qty 4

## 2019-02-23 NOTE — Progress Notes (Signed)
Occupational Therapy Session Note  Patient Details  Name: Scott Mcdowell MRN: [id number] Date of Birth: 1941/05/01  Today's Date: 02/23/2019 OT Individual Time: 1100-1200 OT Individual Time Calculation (min): 60 min    Short Term Goals: Week 2:  OT Short Term Goal 1 (Week 2): Pt will complete 1 grooming task in standing for increased standing tolerance OT Short Term Goal 2 (Week 2): Pt will complete bathing with mod assist at sit > stand level OT Short Term Goal 3 (Week 2): Pt will complete UB dressing with min A OT Short Term Goal 4 (Week 2): Pt will complete LB dressing with mod A  Skilled Therapeutic Interventions/Progress Updates:    Pt seen this session to focus on RUE AROM and sit to stand skills. Pt expressed the desire to improve his standing. Pt received in bed, supine to sit min, slide board with CGA of 1 bed to w/c, w/c >< therapy mat and then back to bed at end of session.  On therapy mat, pt worked on RUE AROM with B hands on dowel focusing on shoulder and elbow AROM.   Sit to stand to EVA walker 3x with mod -max A (+2) focusing on pt pushing up with his L hand. Standing focus on R leg extension with wt shifts.  Tolerated each stand 30-60 seconds. Returned to room with all needs met. Bed alarm set.    Therapy Documentation Precautions:  Precautions Precautions: Fall Precaution Comments: R hemiparesis, h/o 1 fall in past 1 year; hemiparesis more apparent due to debility Restrictions Weight Bearing Restrictions: No    Pain: Pain Assessment Pain Score: 0-No pain    Therapy/Group: Individual Therapy  Juliaetta 02/23/2019, 12:46 PM

## 2019-02-23 NOTE — Progress Notes (Addendum)
[place] PHYSICAL MEDICINE & REHABILITATION PROGRESS NOTE  Subjective/Complaints: Patient seen sitting up in his chair this morning.  He states he slept well overnight.  He states that he ate blueberry pancakes a couple days ago and believes that that increased his potassium. Discussed edema with PA and therapies.  He was seen by nephrology yesterday, notes reviewed.  ROS: Denies CP, SOB, N/V/D  Objective: Vital Signs: Blood pressure (!) 115/47, pulse 92, temperature 98.8 F (37.1 C), resp. rate 16, height 6\' 2"  (1.88 m), weight 107.5 kg, SpO2 96 %. No results found. Recent Labs    02/20/19 1600  WBC 5.4  HGB 8.6*  HCT 27.9*  PLT 178   Recent Labs    02/20/19 1600  NA 138  K 3.7  CL 100  CO2 25  GLUCOSE 118*  BUN 67*  CREATININE 5.42*  CALCIUM 8.7*    Physical Exam: BP (!) 115/47 (BP Location: Left Leg)   Pulse 92   Temp 98.8 F (37.1 C)   Resp 16   Ht 6\' 2"  (1.88 m)   Wt 107.5 kg   SpO2 96%   BMI 30.43 kg/m  Constitutional: No distress . Vital signs reviewed. HENT: Normocephalic.  Atraumatic. Eyes: EOMI.  No discharge. Cardiovascular: No JVD. Respiratory: Normal effort. GI: Non-distended. Musc: No edema or tenderness in extremities. Musc: Generalized edema > RUE Neurological: Alert Follows commands Motor:  Right upper extremity: 3-/5 proximal distal, stable Right lower extremity: Hip flexion 2+/5, knee extension 2+/5, ankle dorsiflexion 3+/5 Skin: Skin is warm and dry.  Sacral ulcers Psychiatric: pleasant and cooperative  Assessment/Plan: 1. Functional deficits secondary to debility which require 3+ hours per day of interdisciplinary therapy in a comprehensive inpatient rehab setting.  Physiatrist is providing close team supervision and 24 hour management of active medical problems listed below.  Physiatrist and rehab team continue to assess barriers to discharge/monitor patient progress toward functional and medical goals  Care  Tool:  Bathing    Body parts bathed by patient: Right arm, Chest, Abdomen, Right upper leg, Left upper leg, Face, Front perineal area   Body parts bathed by helper: Left arm, Buttocks, Right lower leg, Left lower leg     Bathing assist Assist Level: Moderate Assistance - Patient 50 - 74%     Upper Body Dressing/Undressing Upper body dressing   What is the patient wearing?: Pull over shirt    Upper body assist Assist Level: Moderate Assistance - Patient 50 - 74%    Lower Body Dressing/Undressing Lower body dressing    Lower body dressing activity did not occur: Refused What is the patient wearing?: Pants, Incontinence brief     Lower body assist Assist for lower body dressing: Maximal Assistance - Patient 25 - 49%     Toileting Toileting    Toileting assist Assist for toileting: Total Assistance - Patient < 25%     Transfers Chair/bed transfer  Transfers assist     Chair/bed transfer assist level: Total Assistance - Patient < 25%     Locomotion Ambulation   Ambulation assist   Ambulation activity did not occur: Safety/medical concerns(weakness)          Walk 10 feet activity   Assist           Walk 50 feet activity   Assist           Walk 150 feet activity   Assist Walk 150 feet activity did not occur: Safety/medical concerns(weakness)  Walk 10 feet on uneven surface  activity   Assist           Wheelchair     Assist Will patient use wheelchair at discharge?: Yes Type of Wheelchair: Manual    Wheelchair assist level: Supervision/Verbal cueing Max wheelchair distance: 55'    Wheelchair 50 feet with 2 turns activity    Assist    Wheelchair 50 feet with 2 turns activity did not occur: Safety/medical concerns(weakness)       Wheelchair 150 feet activity     Assist Wheelchair 150 feet activity did not occur: Safety/medical concerns(weakness)          Medical Problem List and Plan: 1.   Debility secondary to acute renal failure/ascites/multi-medical  Continue CIR 2.  Antithrombotics:   Right brachiocephalic DVT 02/24/4695: Eliquis             -antiplatelet therapy: N/A 3. Pain Management: Hydrocodone as needed 4. Mood: Provide emotional support             -antipsychotic agents: N/A 5. Neuropsych: This patient is capable of making decisions on his own behalf. 6. Skin/Wound Care/multiple decubiti sacrum/heels.:  Prevalon boots and follow-up wound care nurse routine skin checks 7. Fluids/Electrolytes/Nutrition: Routine in and outs.   8.  GI bleed.  EGD 01/21/2019.  Follow-up gastroenterology recommendations.    Hemoglobin 8.6 on 7/4  Added simethicone for gas 9.    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  Status post left brachiocephalic AV fistula.  Hemodialysis Tuesday Thursday Saturday.    Hepatitis B neg  Appreciate nephro recs  Plan for HD today 10.  Plasma cell myeloma.  Outpatient follow-up with oncology to discuss formal chemotherapy.  Appreciate oncology recommendations  Recs per oncology, no changes at present 11.  Right pleural effusion.  Status post thoracentesis 01/16/2019. 12.  Orthostasis.  ProAmatine 10 mg 3 times daily.    Controlled on 7/7  Monitor with increased mobility 13.  Episodic SVT.  8 beats noted 02/08/2019.  Amiodarone as directed.    Controlled on 7/7  Monitor with increased mobility 13.  History of CVA with recurrent right hemiparesis. 14.  Steroid-induced hyperglycemia  Slightly elevated on 7/4 15.  Peripheral edema Filed Weights   02/20/19 1751 02/21/19 0534 02/22/19 0526  Weight: 100.5 kg 105.7 kg 107.5 kg   ?  Reliability on 7/6  Echo reviewed 12/2018-normal EF  Continue HD   LOS: 13 days A FACE TO FACE EVALUATION WAS PERFORMED   Lorie Phenix 02/23/2019, 9:35 AM

## 2019-02-23 NOTE — Progress Notes (Signed)
Physical Therapy Session Note  Patient Details  Name: Scott Mcdowell MRN: [id number] Date of Birth: 09-07-1940  Today's Date: 02/23/2019 PT Individual Time: 0920-1020 PT Individual Time Calculation (min): 60 min   Short Term Goals: Week 2:  PT Short Term Goal 1 (Week 2): Patient will perform rolling in the bed and supine to sit with CGA. PT Short Term Goal 2 (Week 2): Patient will perform sit to supine with min A. PT Short Term Goal 3 (Week 2): Patient will perform level slide board transfers consistently with mod A of 1 person. PT Short Term Goal 4 (Week 2): Patient will initiate gait training.  Skilled Therapeutic Interventions/Progress Updates:     Patient in TIS w/c upon PT arrival. Patient alert and agreeable to PT session. Patient with R UE edema with clear serus drainage from upper arm. Applied Curlex and 2-4" ACE wraps for drainage and edema control, RN made aware. Also donned GIV-Mohr sling to R UE prior to mobility.   Therapeutic Activity: Bed Mobility: Patient performed sit to supine with min A for R LE management, able to assist R LE with L using scoop technique today. Transfers: Patient performed a slide board transfer from the TIS w/c to the bed with min A for physical assist and total A for board set up. Patient able to teach back head-hips relationship and hand placement for transfer. He performed sit to/from stand x2 using the EVA walker with mod A +2. Provided verbal cues for leaning forward and pushing through R LE. Therapists provided facilitation and blocking of R knee.   Gait Training:  Patient ambulated 5 feet and 8 feet using the EVA walker with mod A of 1 and min A of a second person with initial facilitation for R foot advancement, then patient able to advance independently. Ambulated with step-to and intermittent step-through gait pattern leading with R LE. Provided verbal and tactile cues for erect posture, R foot advancement with L weight shift and R quad  activation in stance.  Patient in bed at end of session with breaks locked, bed alarm set, and all needs within reach. Discussed potential w/c evaluation for TIS vs power chair or manual chair at d/c. Patient discouraged about needing a w/c at d/c and PT discussed current progress, POC, current comorbidities, and potential for increased independence and/or accessability with mobility at d/c with a w/c. Patient stated understanding and seemed in better spirits following education.    Therapy Documentation Precautions:  Precautions Precautions: Fall Precaution Comments: R hemiparesis, h/o 1 fall in past 1 year; hemiparesis more apparent due to debility Restrictions Weight Bearing Restrictions: No Pain: Pain Assessment Pain Score: 0-No pain   Therapy/Group: Individual Therapy  Shylynn Bruning L Njeri Vicente PT, DPT  02/23/2019, 1:21 PM

## 2019-02-23 NOTE — Procedures (Signed)
Patient seen and examined on Hemodialysis. BP (!) 115/47 (BP Location: Left Leg)   Pulse 92   Temp 98.8 F (37.1 C)   Resp 16   Ht 6\' 2"  (1.88 m)   Wt 107.5 kg   SpO2 96%   BMI 30.43 kg/m   QB  400 ml/ min via TDC UF goal 3L.  Has no complaints at this time.     Madelon Lips MD [facility] pgr [phone] 2:02 PM

## 2019-02-23 NOTE — Progress Notes (Signed)
  [facility] Progress Note   Assessment/ Plan:   #Acute kidney injury now dialysis dependent and  progressed to ESRD.  He has TDC and AV fistula.  -He has right IJ TDC and left brachiocephalic AV fistula. -He has outpatient spot for TTS at [facility] at 12:15. -Status post HD 7/4 with 3 L UF.  He feels much better.  Plan for next dialysis today 7/7.  He is on midodrine and may need to lower dialysate temperature for intradialytic hypotension. - increased LE edema--> will see what HD does today, may need to increase treatment time to get more UF off.    # Anemia due to CKD: Received Feraheme on 6/15 and then 4 doses of Ferrlecit started on 6/27.  Continue ESA.  Monitor hemoglobin.  # Secondary hyperparathyroidism: Phosphorus level acceptable. PTH 40.  Continue Renvela.  # Hypotension/volume: Continue midodrine to 15 mg 3 times daily.    #Right upper extremity DVT: On Eliquis per primary team.  #Plasma cell myeloma per oncology  Subjective:    Seen on dialysis.  Reports therapies went well today.  Hoping for another week of rehab.  Notes LE edema has "come back".     Objective:   BP (!) 115/47 (BP Location: Left Leg)   Pulse 92   Temp 98.8 F (37.1 C)   Resp 16   Ht 6\' 2"  (1.88 m)   Wt 107.5 kg   SpO2 96%   BMI 30.43 kg/m   Physical Exam: Gen: older gentleman, NAD, sitting in bed on dialysis CVS:RRR Resp: clear Abd: + abd wall edema Ext: 2+ LE edema ACCESS: L UE AVF + T/B  Labs: BMET Recent Labs  Lab 02/20/19 0509 02/20/19 1600  NA 139 138  K 4.1 3.7  CL 100 100  CO2 26 25  GLUCOSE 92 118*  BUN 65* 67*  CREATININE 5.25* 5.42*  CALCIUM 8.9 8.7*  PHOS 4.0 3.7   CBC Recent Labs  Lab 02/20/19 0509 02/20/19 1600  WBC 5.6 5.4  HGB 8.7* 8.6*  HCT 27.7* 27.9*  MCV 103.4* 106.1*  PLT 176 178    @IMGRELPRIORS @ Medications:    . sodium chloride   Intravenous Once  . apixaban  5 mg Oral BID  . Chlorhexidine Gluconate Cloth  6 each  Topical Q0600  . Chlorhexidine Gluconate Cloth  6 each Topical Q0600  . darbepoetin (ARANESP) injection - DIALYSIS  200 mcg Intravenous Q Thu-HD  . feeding supplement (NEPRO CARB STEADY)  237 mL Oral TID BM  . liver oil-zinc oxide   Topical 5 X Daily  . midodrine  15 mg Oral TID WC  . multivitamin  1 tablet Oral QHS  . nystatin   Topical TID  . pantoprazole  40 mg Oral BID  . polyethylene glycol  17 g Oral Daily  . senna-docusate  1 tablet Oral QHS  . sevelamer carbonate  2,400 mg Oral TID WC  . simethicone  80 mg Oral TID     Madelon Lips MD [facility] pgr [phone] 02/23/2019, 1:59 PM

## 2019-02-23 NOTE — Progress Notes (Signed)
Occupational Therapy Session Note  Patient Details  Name: Scott Mcdowell MRN: [id number] Date of Birth: 01-30-1941  Today's Date: 02/23/2019 OT Individual Time: 0700-0810 OT Individual Time Calculation (min): 70 min    Short Term Goals: Week 2:  OT Short Term Goal 1 (Week 2): Pt will complete 1 grooming task in standing for increased standing tolerance OT Short Term Goal 2 (Week 2): Pt will complete bathing with mod assist at sit > stand level OT Short Term Goal 3 (Week 2): Pt will complete UB dressing with min A OT Short Term Goal 4 (Week 2): Pt will complete LB dressing with mod A  Skilled Therapeutic Interventions/Progress Updates:    Pt resting in bed upon arrival.  OT intervention with focus on bed mobility, sitting balance, sit<>stand, standing in [place], BADL retraining, and activity tolerance to increase independence with BADLs. Pt requires min A supine>sit EOB but able to reposition sitting EOB with supervision.  Pt requires mod A+2 for sit<>stand from EOB to change pants. Pt max A for LB dressing with sit<>stand in [place]. Pt completed UB bathing/dressing tasks seated in w/c at sink.  Pt requires mod A for UB dressing tasks with pull over shirt.  RUE edema same as previous day.  RUE shoulder sublux same but not painful today. Pt remained seated in TIS w/c eating breakfast.  Lap tray in place and belt alarm activated. All needs within reach.   Therapy Documentation Precautions:  Precautions Precautions: Fall Precaution Comments: R hemiparesis, h/o 1 fall in past 1 year; hemiparesis more apparent due to debility Restrictions Weight Bearing Restrictions: No Pain:  Pt denies pain this morning   Therapy/Group: Individual Therapy  [name] 02/23/2019, 8:17 AM

## 2019-02-24 ENCOUNTER — Inpatient Hospital Stay (HOSPITAL_COMMUNITY): Payer: Medicare Other | Admitting: Occupational Therapy

## 2019-02-24 ENCOUNTER — Inpatient Hospital Stay (HOSPITAL_COMMUNITY): Payer: Medicare Other

## 2019-02-24 ENCOUNTER — Inpatient Hospital Stay (HOSPITAL_COMMUNITY): Payer: Medicare Other | Admitting: *Deleted

## 2019-02-24 NOTE — Progress Notes (Signed)
Occupational Therapy Session Note  Patient Details  Name: Scott Mcdowell MRN: [id number] Date of Birth: 1941/02/12  Today's Date: 02/24/2019 OT Individual Time: 1105-1130 OT Individual Time Calculation (min): 25 min    Short Term Goals: Week 2:  OT Short Term Goal 1 (Week 2): Pt will complete 1 grooming task in standing for increased standing tolerance OT Short Term Goal 2 (Week 2): Pt will complete bathing with mod assist at sit > stand level OT Short Term Goal 3 (Week 2): Pt will complete UB dressing with min A OT Short Term Goal 4 (Week 2): Pt will complete LB dressing with mod A  Skilled Therapeutic Interventions/Progress Updates:    Pt received in w/c.  Focused this session on addressing his RUE edema.  Ace wrap removed and worked on retrograde massage and a/arom for wrist and fingers.  Pt's edema has reduced but he continues to have it in dorsum of hand, middle finger, and around  Elbow.   Applied narrow coban around digits, medium coban on hand to forearm and wide coban forearm to above elbow.  Educated pt to watch for change in skin color and if pt becomes uncomfortable it can easily be removed.   He will have another OT session at 1300, and that therapist will check to ensure the coban is not too tight.    Pt resting in chair, alarm on, RUE elevated on pillow, call light in reach.  Therapy Documentation Precautions:  Precautions Precautions: Fall Precaution Comments: R hemiparesis, h/o 1 fall in past 1 year; hemiparesis more apparent due to debility Restrictions Weight Bearing Restrictions: No      Pain:  No c/o pain      Therapy/Group: Individual Therapy  Murna Backer 02/24/2019, 11:53 AM

## 2019-02-24 NOTE — Progress Notes (Signed)
Occupational Therapy Session Note  Patient Details  Name: Scott Mcdowell MRN: [id number] Date of Birth: 1941-05-09  Today's Date: 02/24/2019 OT Individual Time: 0700-0810 OT Individual Time Calculation (min): 70 min    Short Term Goals: Week 2:  OT Short Term Goal 1 (Week 2): Pt will complete 1 grooming task in standing for increased standing tolerance OT Short Term Goal 2 (Week 2): Pt will complete bathing with mod assist at sit > stand level OT Short Term Goal 3 (Week 2): Pt will complete UB dressing with min A OT Short Term Goal 4 (Week 2): Pt will complete LB dressing with mod A  Skilled Therapeutic Interventions/Progress Updates:   Pt resting in bed upon arrival.  OT intervention with focus on BADL retraining, AE use, bed mobility, sitting balance, sit<>stand, standing balance, activity tolerance, and safety awareness to increase independence with BADLs. Supine>sit with HOB elevated and use of bed rails at supervision level.  Pt repostioned at EOB with supervision. Reacher introduced for use with threading pants for LB dressing tasks.  Pt able to thread BLE and pull pants over knees while seated EOB.  Pt required mod A for sit<>stand from EOB with [place].  Pt required assistance pulling pants over hips. Pt donned pullover shirt with min A.  Pt performed sit<>stand from elevated surface in [place] with CGA X 3. Pt maintained standing balance in [place] X 2 for appox 60 secs.  Pt setup for breakfast and remained seated in w/c eating breakfast.  All needs within reach.   Therapy Documentation Precautions:  Precautions Precautions: Fall Precaution Comments: R hemiparesis, h/o 1 fall in past 1 year; hemiparesis more apparent due to debility Restrictions Weight Bearing Restrictions: No  Pain:     Therapy/Group: Individual Therapy  [name] 02/24/2019, 8:17 AM

## 2019-02-24 NOTE — Progress Notes (Signed)
[place] PHYSICAL MEDICINE & REHABILITATION PROGRESS NOTE  Subjective/Complaints: Patient seen sitting up in his chair working with therapy this morning.  He states he slept well overnight.  He notes significant improvement with edema after HD.  Discussed swelling.  ROS: Denies CP, SOB, N/V/D  Objective: Vital Signs: Blood pressure (!) 154/55, pulse 84, temperature 98.4 F (36.9 C), temperature source Oral, resp. rate 18, height 6\' 2"  (1.88 m), weight 102.1 kg, SpO2 96 %. No results found. Recent Labs    02/23/19 1404  WBC 6.1  HGB 8.5*  HCT 27.3*  PLT 205   Recent Labs    02/23/19 1404  NA 139  K 3.8  CL 99  CO2 26  GLUCOSE 125*  BUN 66*  CREATININE 5.76*  CALCIUM 9.3    Physical Exam: BP (!) 154/55 (BP Location: Left Leg)   Pulse 84   Temp 98.4 F (36.9 C) (Oral)   Resp 18   Ht 6\' 2"  (1.88 m)   Wt 102.1 kg   SpO2 96%   BMI 28.90 kg/m  Constitutional: No distress . Vital signs reviewed. HENT: Normocephalic. Atraum. Eyes: EOMI.  No discharge. Cardiovascular: No JVD. Respiratory: Normal effort. GI: Non-distended. Musc: No edema or tenderness in extremities. Musc: Generalized edema, improved today Neurological: Alert Follows commands Motor:  Right upper extremity: 3-/5 proximal distal, stable Right lower extremity: Hip flexion 2+/5, knee extension 2+/5, ankle dorsiflexion 3+/5, stable Skin: Skin is warm and dry.  Sacral ulcers Psychiatric: pleasant and cooperative  Assessment/Plan: 1. Functional deficits secondary to debility which require 3+ hours per day of interdisciplinary therapy in a comprehensive inpatient rehab setting.  Physiatrist is providing close team supervision and 24 hour management of active medical problems listed below.  Physiatrist and rehab team continue to assess barriers to discharge/monitor patient progress toward functional and medical goals  Care Tool:  Bathing    Body parts bathed by patient: Right arm, Chest,  Abdomen, Right upper leg, Left upper leg, Face, Front perineal area   Body parts bathed by helper: Left arm, Buttocks, Right lower leg, Left lower leg     Bathing assist Assist Level: Moderate Assistance - Patient 50 - 74%     Upper Body Dressing/Undressing Upper body dressing   What is the patient wearing?: Pull over shirt    Upper body assist Assist Level: Moderate Assistance - Patient 50 - 74%    Lower Body Dressing/Undressing Lower body dressing    Lower body dressing activity did not occur: Refused What is the patient wearing?: Pants, Incontinence brief     Lower body assist Assist for lower body dressing: Moderate Assistance - Patient 50 - 74%     Toileting Toileting    Toileting assist Assist for toileting: Total Assistance - Patient < 25%     Transfers Chair/bed transfer  Transfers assist     Chair/bed transfer assist level: Minimal Assistance - Patient > 75%     Locomotion Ambulation   Ambulation assist   Ambulation activity did not occur: Safety/medical concerns(weakness)  Assist level: Moderate Assistance - Patient 50 - 74% Assistive device: Other (comment)(r shoe cover, L heel lift) Max distance: 14   Walk 10 feet activity   Assist     Assist level: Moderate Assistance - Patient - 50 - 74% Assistive device: Walker-Eva   Walk 50 feet activity   Assist           Walk 150 feet activity   Assist Walk 150 feet activity did not  occur: Safety/medical concerns(weakness)         Walk 10 feet on uneven surface  activity   Assist           Wheelchair     Assist Will patient use wheelchair at discharge?: Yes Type of Wheelchair: Manual    Wheelchair assist level: Supervision/Verbal cueing Max wheelchair distance: 26'    Wheelchair 50 feet with 2 turns activity    Assist    Wheelchair 50 feet with 2 turns activity did not occur: Safety/medical concerns(weakness)       Wheelchair 150 feet activity      Assist Wheelchair 150 feet activity did not occur: Safety/medical concerns(weakness)          Medical Problem List and Plan: 1.  Debility secondary to acute renal failure/ascites/multi-medical  Continue CIR  Team conference today to discuss current and goals and coordination of care, home and environmental barriers, and discharge planning with nursing, case manager, and therapies.  2.  Antithrombotics:   Right brachiocephalic DVT 10/18/5174: Eliquis             -antiplatelet therapy: N/A 3. Pain Management: Hydrocodone as needed 4. Mood: Provide emotional support             -antipsychotic agents: N/A 5. Neuropsych: This patient is capable of making decisions on his own behalf. 6. Skin/Wound Care/multiple decubiti sacrum/heels.:  Prevalon boots and follow-up wound care nurse routine skin checks 7. Fluids/Electrolytes/Nutrition: Routine in and outs.   8.  GI bleed.  EGD 01/21/2019.  Follow-up gastroenterology recommendations.    Hemoglobin 8.5 on 7/7  Added simethicone for gas 9.    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  Status post left brachiocephalic AV fistula.  Hemodialysis Tuesday Thursday Saturday.    Hepatitis B neg  Appreciate nephro recs 10.  Plasma cell myeloma.  Outpatient follow-up with oncology to discuss formal chemotherapy.  Appreciate oncology recommendations  Recs per oncology, no changes at present 11.  Right pleural effusion.  Status post thoracentesis 01/16/2019. 12.  Orthostasis.  ProAmatine 10 mg 3 times daily.    Labile on 7/8  Monitor with increased mobility 13.  Episodic SVT.  8 beats noted 02/08/2019.  Amiodarone as directed.    Controlled on 7/8  Monitor with increased mobility 13.  History of CVA with recurrent right hemiparesis. 14.  Steroid-induced hyperglycemia  Slightly elevated on 7/7 15.  Peripheral edema Filed Weights   02/22/19 0526 02/23/19 1320 02/23/19 1625  Weight: 107.5 kg 103.4 kg 102.1 kg   ?  Reliability on 7/8  Echo  reviewed 12/2018-normal EF  Improved with HD  Continue HD   LOS: 14 days A FACE TO FACE EVALUATION WAS PERFORMED  Ankit Lorie Phenix 02/24/2019, 11:28 AM

## 2019-02-24 NOTE — Progress Notes (Signed)
  [facility] Progress Note   Assessment/ Plan:   #Acute kidney injury now dialysis dependent and  progressed to ESRD.  He has TDC and AV fistula.  -He has right IJ TDC and left brachiocephalic AV fistula. -He has outpatient spot for TTS at [facility] at 12:15. -Status post HD 7.7, next planned 7/9.  He is on midodrine and a lower machine temp - increased LE edema--> will see what HD does today, may need to increase treatment time to get more UF off.    # Anemia due to CKD: Received Feraheme on 6/15 and then 4 doses of Ferrlecit started on 6/27.  Continue ESA.  Monitor hemoglobin.  # Secondary hyperparathyroidism: Phosphorus level acceptable. PTH 40.  Continue Renvela.  # Hypotension/volume: Continue midodrine to 15 mg 3 times daily.    #Right upper extremity DVT: On Eliquis per primary team.  #Plasma cell myeloma per oncology  Subjective:    HD yesterday with 2.3L removed.  Pt reports that edema is better.  Walked 14 steps today.     Objective:   BP (!) 90/48   Pulse 82   Temp 97.6 F (36.4 C)   Resp 20   Ht 6\' 2"  (1.88 m)   Wt 102.1 kg   SpO2 100%   BMI 28.90 kg/m   Physical Exam: Gen: older gentleman, NAD, sitting in bed CVS:RRR Resp: clear Abd: + abd wall edema Ext: 1+ LE edema, improved ACCESS: L UE AVF + T/B and R IJ [facility]   Labs: BMET Recent Labs  Lab 02/20/19 0509 02/20/19 1600 02/23/19 1404  NA 139 138 139  K 4.1 3.7 3.8  CL 100 100 99  CO2 26 25 26   GLUCOSE 92 118* 125*  BUN 65* 67* 66*  CREATININE 5.25* 5.42* 5.76*  CALCIUM 8.9 8.7* 9.3  PHOS 4.0 3.7 2.7   CBC Recent Labs  Lab 02/20/19 0509 02/20/19 1600 02/23/19 1404  WBC 5.6 5.4 6.1  HGB 8.7* 8.6* 8.5*  HCT 27.7* 27.9* 27.3*  MCV 103.4* 106.1* 106.2*  PLT 176 178 205    @IMGRELPRIORS @ Medications:    . sodium chloride   Intravenous Once  . apixaban  5 mg Oral BID  . Chlorhexidine Gluconate Cloth  6 each Topical Q0600  . Chlorhexidine Gluconate Cloth  6 each  Topical Q0600  . darbepoetin (ARANESP) injection - DIALYSIS  200 mcg Intravenous Q Thu-HD  . feeding supplement (NEPRO CARB STEADY)  237 mL Oral TID BM  . liver oil-zinc oxide   Topical 5 X Daily  . midodrine  15 mg Oral TID WC  . multivitamin  1 tablet Oral QHS  . nystatin   Topical TID  . pantoprazole  40 mg Oral BID  . polyethylene glycol  17 g Oral Daily  . senna-docusate  1 tablet Oral QHS  . sevelamer carbonate  2,400 mg Oral TID WC  . simethicone  80 mg Oral TID     Madelon Lips MD [facility] pgr [phone] 02/24/2019, 3:11 PM

## 2019-02-24 NOTE — Progress Notes (Addendum)
Physical Therapy Session Note  Patient Details  Name: Scott Mcdowell MRN: [id number] Date of Birth: Jun 20, 1941  Today's Date: 02/24/2019 PT Individual Time: 3009-2330 PT Individual Time Calculation (min): 45 min   Short Term Goals: Week 2:  PT Short Term Goal 1 (Week 2): Patient will perform rolling in the bed and supine to sit with CGA. PT Short Term Goal 2 (Week 2): Patient will perform sit to supine with min A. PT Short Term Goal 3 (Week 2): Patient will perform level slide board transfers consistently with mod A of 1 person. PT Short Term Goal 4 (Week 2): Patient will initiate gait training.     Skilled Therapeutic Interventions/Progress Updates:   pt resting in tilt in space w/c.  PT donned pt's shoes over TEDS.    Slide board transfer to L w/c> mat, min assist.  therapeutic activity in sitting for dynamic balance and neuro re-ed via RUE wt bearing, reaching with L hand across body to choose a peg, then push it into board on his L.  No LOB.  Sit> stand from mat with +2, mod assist, to EVA walker.  Wt shifting in standing, 10 x 1 L><R wt shifting for R stance stability.  Gait training with EVA walker, R shoe cover for reduced friction, L heel lift to "lengthen" LLE, x 14' with mod assist and w/c follow for safety.  Mod multimodal cues for R knee extension during R single limb stance.    neuromuscular re-education via visual feedback for R wrist and finger extension, in sitting with UE supported on pillow.  Spoke with Linna Hoff, [place] regarding edema LLE and trunk which limits sit> supine due to weight.  Nephrology states that HD affects edema and weight of limbs.  Geoffry Paradise PT/OT  ACE wrapping LLE to groin to address edema/LE weight, and see if it is helpful for bed mobility.    At end of session, pt resting in tilt in space w/c with needs at hand.     Therapy Documentation Precautions:  Precautions Precautions: Fall Precaution Comments: R hemiparesis, h/o 1 fall in past 1 year;  hemiparesis more apparent due to debility Restrictions Weight Bearing Restrictions: No  Pain: pt denies        Therapy/Group: Individual Therapy  Elesa Garman 02/24/2019, 9:58 AM

## 2019-02-24 NOTE — Progress Notes (Signed)
Occupational Therapy Session Note  Patient Details  Name: Scott Mcdowell MRN: [id number] Date of Birth: 10-Sep-1940  Today's Date: 02/24/2019 OT Individual Time: 1300-1355 OT Individual Time Calculation (min): 55 min    Short Term Goals: Week 1:  OT Short Term Goal 1 (Week 1): Pt will complete bathing with mod assist at sit > stand level OT Short Term Goal 1 - Progress (Week 1): Progressing toward goal OT Short Term Goal 2 (Week 1): Pt will complete LB dressing at max assist level OT Short Term Goal 2 - Progress (Week 1): Met OT Short Term Goal 3 (Week 1): Pt will complete toilet transfer max assist stand pivot OT Short Term Goal 3 - Progress (Week 1): Progressing toward goal OT Short Term Goal 4 (Week 1): Pt will complete 1 grooming task in standing for increased standing tolerance  Skilled Therapeutic Interventions/Progress Updates:    OT intervention with focus on functional transfers, sit<>stand, standing balance, and discharge planning.  Pt performs slide board transfers with min A after board placed.  Sit>stand X6 with min A from elevated surface.  Pt maintained standing with min A X 5 for approx 1 min.  Pt returned to room and transferred back to bed with slide board.  Pt required max A for sit>supine to manage legs. Pt remained in bed with all needs within reach and bed alarm activatyed.   Therapy Documentation Precautions:  Precautions Precautions: Fall Precaution Comments: R hemiparesis, h/o 1 fall in past 1 year; hemiparesis more apparent due to debility Restrictions Weight Bearing Restrictions: No Pain:  Pt denies pain   Therapy/Group: Individual Therapy  [name] 02/24/2019, 3:05 PM

## 2019-02-25 ENCOUNTER — Inpatient Hospital Stay (HOSPITAL_COMMUNITY): Payer: Medicare Other

## 2019-02-25 ENCOUNTER — Inpatient Hospital Stay (HOSPITAL_COMMUNITY): Payer: Medicare Other | Admitting: *Deleted

## 2019-02-25 MED ORDER — MIDODRINE HCL 5 MG PO TABS
ORAL_TABLET | ORAL | Status: AC
  Filled 2019-02-25: qty 3

## 2019-02-25 MED ORDER — DARBEPOETIN ALFA 200 MCG/0.4ML IJ SOSY
PREFILLED_SYRINGE | INTRAMUSCULAR | Status: AC
  Filled 2019-02-25: qty 0.4

## 2019-02-25 NOTE — Progress Notes (Signed)
[place] PHYSICAL MEDICINE & REHABILITATION PROGRESS NOTE  Subjective/Complaints: Patient seen sitting up in his chair this morning.  He states he slept well overnight.  He notes decreasing edema.  He was seen by nephrology yesterday, notes reviewed  ROS: Denies CP, SOB, N/V/D  Objective: Vital Signs: Blood pressure (!) 106/41, pulse 91, temperature 98.2 F (36.8 C), temperature source Oral, resp. rate 16, height 6\' 2"  (1.88 m), weight 102.1 kg, SpO2 99 %. No results found. Recent Labs    02/23/19 1404  WBC 6.1  HGB 8.5*  HCT 27.3*  PLT 205   Recent Labs    02/23/19 1404  NA 139  K 3.8  CL 99  CO2 26  GLUCOSE 125*  BUN 66*  CREATININE 5.76*  CALCIUM 9.3    Physical Exam: BP (!) 106/41   Pulse 91   Temp 98.2 F (36.8 C) (Oral)   Resp 16   Ht 6\' 2"  (1.88 m)   Wt 102.1 kg   SpO2 99%   BMI 28.90 kg/m  Constitutional: No distress . Vital signs reviewed. HENT: Normocephalic.  Atraumatic. Eyes: EOMI.  No discharge. Cardiovascular: No JVD. Respiratory: Normal effort. GI: Non--distended. Musc: No edema or tenderness in extremities. Musc: Generalized edema, improving Neurological: Alert Follows commands Motor:  Right upper extremity: 3-/5 proximal distal, unchanged Right lower extremity: Hip flexion 2+/5, knee extension 2+/5, ankle dorsiflexion 3+/5, unchanged Skin: Skin is warm and dry.  Sacral ulcers Psychiatric: pleasant and cooperative  Assessment/Plan: 1. Functional deficits secondary to debility which require 3+ hours per day of interdisciplinary therapy in a comprehensive inpatient rehab setting.  Physiatrist is providing close team supervision and 24 hour management of active medical problems listed below.  Physiatrist and rehab team continue to assess barriers to discharge/monitor patient progress toward functional and medical goals  Care Tool:  Bathing    Body parts bathed by patient: Right arm, Chest, Abdomen, Right upper leg, Left upper  leg, Face, Front perineal area   Body parts bathed by helper: Left arm, Buttocks, Right lower leg, Left lower leg     Bathing assist Assist Level: Moderate Assistance - Patient 50 - 74%     Upper Body Dressing/Undressing Upper body dressing   What is the patient wearing?: Pull over shirt    Upper body assist Assist Level: Moderate Assistance - Patient 50 - 74%    Lower Body Dressing/Undressing Lower body dressing    Lower body dressing activity did not occur: Refused What is the patient wearing?: Pants, Incontinence brief     Lower body assist Assist for lower body dressing: Moderate Assistance - Patient 50 - 74%     Toileting Toileting    Toileting assist Assist for toileting: Total Assistance - Patient < 25%     Transfers Chair/bed transfer  Transfers assist     Chair/bed transfer assist level: Minimal Assistance - Patient > 75%     Locomotion Ambulation   Ambulation assist   Ambulation activity did not occur: Safety/medical concerns(weakness)  Assist level: Moderate Assistance - Patient 50 - 74% Assistive device: Other (comment)(L heel lift, R shoe cover) Max distance: 35   Walk 10 feet activity   Assist     Assist level: Moderate Assistance - Patient - 50 - 74% Assistive device: Walker-Eva   Walk 50 feet activity   Assist           Walk 150 feet activity   Assist Walk 150 feet activity did not occur: Safety/medical concerns(weakness)  Walk 10 feet on uneven surface  activity   Assist           Wheelchair     Assist Will patient use wheelchair at discharge?: Yes Type of Wheelchair: Manual    Wheelchair assist level: Supervision/Verbal cueing Max wheelchair distance: 70'    Wheelchair 50 feet with 2 turns activity    Assist    Wheelchair 50 feet with 2 turns activity did not occur: Safety/medical concerns(weakness)       Wheelchair 150 feet activity     Assist Wheelchair 150 feet activity did  not occur: Safety/medical concerns(weakness)          Medical Problem List and Plan: 1.  Debility secondary to acute renal failure/ascites/multi-medical  Continue CIR 2.  Antithrombotics:   Right brachiocephalic DVT 02/17/2196: Eliquis             -antiplatelet therapy: N/A 3. Pain Management: Hydrocodone as needed 4. Mood: Provide emotional support             -antipsychotic agents: N/A 5. Neuropsych: This patient is capable of making decisions on his own behalf. 6. Skin/Wound Care/multiple decubiti sacrum/heels.:  Prevalon boots and follow-up wound care nurse routine skin checks 7. Fluids/Electrolytes/Nutrition: Routine in and outs.   8.  GI bleed.  EGD 01/21/2019.  Follow-up gastroenterology recommendations.    Hemoglobin 8.5 on 7/7, labs with HD  Added simethicone for gas 9.    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  Status post left brachiocephalic AV fistula.  Hemodialysis Tuesday Thursday Saturday.    Hepatitis B neg  Appreciate nephro recs 10.  Plasma cell myeloma.  Outpatient follow-up with oncology to discuss formal chemotherapy.  Appreciate oncology recommendations  Recs per oncology, no changes at present 11.  Right pleural effusion.  Status post thoracentesis 01/16/2019. 12.  Orthostasis.  ProAmatine 10 mg 3 times daily.    Labile on 7/9  Monitor with increased mobility 13.  Episodic SVT.  8 beats noted 02/08/2019.  Amiodarone as directed.    Controlled on 7/9  Monitor with increased mobility 13.  History of CVA with recurrent right hemiparesis. 14.  Steroid-induced hyperglycemia  Slightly elevated on 7/7 15.  Peripheral edema Filed Weights   02/22/19 0526 02/23/19 1320 02/23/19 1625  Weight: 107.5 kg 103.4 kg 102.1 kg   ?  Reliability on 7/7, no repeat weights feeling good, will let you know.  Echo reviewed 12/2018-normal EF  Improved with HD  Continue HD   LOS: 15 days A FACE TO FACE EVALUATION WAS PERFORMED  Andrian Sabala Lorie Phenix 02/25/2019, 2:55 PM

## 2019-02-25 NOTE — Progress Notes (Signed)
Occupational Therapy Session Note  Patient Details  Name: Scott Mcdowell MRN: [id number] Date of Birth: 1940/10/31  Today's Date: 02/25/2019 OT Individual Time: [phone] OT Individual Time Calculation (min): 75 min    Short Term Goals: Week 3:  OT Short Term Goal 1 (Week 3): STG=LTG secondary to ELOS  Skilled Therapeutic Interventions/Progress Updates:    Pt resting in bed upon arrival eating breakfast.  Pt commented that he wanted to use toilet for BM.  Pt required min A for supine>sit EOB and min A for sit>stand with Stedy for transfer to toilet.  Max A for toileting tasks.  Thigh high Ted Hose and ace wraps applied to RLE for edema management.  Pt performed sit<>stand from w/c with [facility] for clothing management.  Pt performed sit<>stand X 6 with mod A fading to min A during session.  Pt remained in w/c with all needs within reach. Focus on sit<>stand and sitting balance to increase independence with BADLs.   Therapy Documentation Precautions:  Precautions Precautions: Fall Precaution Comments: R hemiparesis, h/o 1 fall in past 1 year; hemiparesis more apparent due to debility Restrictions Weight Bearing Restrictions: No Pain:  Pt denies pain this morning   Therapy/Group: Individual Therapy  [name] 02/25/2019, 8:59 AM

## 2019-02-25 NOTE — Progress Notes (Signed)
Occupational Therapy Session Note  Patient Details  Name: Scott Mcdowell MRN: [id number] Date of Birth: 1941-07-18  Today's Date: 02/25/2019 OT Individual Time: 1115-1200 OT Individual Time Calculation (min): 45 min    Short Term Goals: Week 3:  OT Short Term Goal 1 (Week 3): STG=LTG secondary to ELOS  Skilled Therapeutic Interventions/Progress Updates:     Pt resting in w/c upon arrival.  Partially rewrapped R hand with [place].  OT intervention with focus on RUE reaching tasks including scapula protraction/retraction, elbow flexion/extension, wrist flexion/etension, and finger flexion/extension.  Pt with improved movements in all planes. Pt performed slide board transfer to bed with min A.  Pt required max A for sit>supine in bed.  Pt remained in bed awaiting HD. All needs within reach.  Therapy Documentation Precautions:  Precautions Precautions: Fall Precaution Comments: R hemiparesis, h/o 1 fall in past 1 year; hemiparesis more apparent due to debility Restrictions Weight Bearing Restrictions: No  Pain: Pain Assessment Pain Scale: 0-10 Pain Score: 0-No pain   Therapy/Group: Individual Therapy  [name] 02/25/2019, 12:28 PM

## 2019-02-25 NOTE — Progress Notes (Signed)
Occupational Therapy Weekly Progress Note  Patient Details  Name: Scott Mcdowell MRN: [id number] Date of Birth: 04-Dec-1940  Beginning of progress report period: February 18, 2019 End of progress report period: February 25, 2019  Patient has met 2 of 3 short term goals.  Pt is making steady progress with BADLs, sit<>stand, standing balance/tolerance, and functional slide board transfers.  Pt continues to have significant edema in RUE/RLE which impedes RUE functional use.  Pt with increase shoulder and elbow movement and weak grasp but limited finger extension.  Pt performs sit<>stand with mod A from elevated surface.  Slide board transfers on level surface with min A after board placement.  Pt's d/c date has been extended 3 days.   Patient continues to demonstrate the following deficits: muscle weakness and muscle joint tightness, decreased cardiorespiratoy endurance, abnormal tone and decreased coordination and decreased standing balance, hemiplegia and decreased balance strategies and therefore will continue to benefit from skilled OT intervention to enhance overall performance with BADL and Reduce care partner burden.  Patient not progressing toward long term goals.  See goal revision..  Plan of care revisions:   Problem: RH Bathing  Goal: LTG Patient will bathe all body parts with assist levels (OT)  Description: LTG: Patient will bathe all body parts with assist levels (OT)  Flowsheets (Taken 02/25/2019 1223)  LTG: Pt will perform bathing with assistance level/cueing: (goal downgraded 02/25/19) Moderate Assistance - Patient 50 - 74%  Note: goal downgraded 02/25/19  Problem: RH Dressing  Goal: LTG Patient will perform upper body dressing (OT)  Description: LTG Patient will perform upper body dressing with assist, with/without cues (OT).  Flowsheets (Taken 02/25/2019 1223)  LTG: Pt will perform upper body dressing with assistance level of: (goal downgraded 02/25/19) Moderate Assistance - Patient 50 -  74%  Note: goal downgraded 02/25/19  Problem: RH Dressing  Goal: LTG Patient will perform lower body dressing w/assist (OT)  Description: LTG: Patient will perform lower body dressing with assist, with/without cues in positioning using equipment (OT)  Flowsheets (Taken 02/25/2019 1223)  LTG: Pt will perform lower body dressing with assistance level of: (goal downgraded 02/25/19) Moderate Assistance - Patient 50 - 74%  Note: goal downgraded 02/25/19  Problem: RH Toileting  Goal: LTG Patient will perform toileting task (3/3 steps) with assistance level (OT)  Description: LTG: Patient will perform toileting task (3/3 steps) with assistance level (OT)  Flowsheets (Taken 02/25/2019 1223)  LTG: Pt will perform toileting task (3/3 steps) with assistance level: (goal downgraded 02/25/19) Moderate Assistance - Patient 50 - 74%  Note: goal downgraded 02/25/19  Problem: RH Toilet Transfers  Goal: LTG Patient will perform toilet transfers w/assist (OT)  Description: LTG: Patient will perform toilet transfers with assist, with/without cues using equipment (OT)  Flowsheets (Taken 02/25/2019 1223)  LTG: Pt will perform toilet transfers with assistance level of: (goal downgraded 02/25/19) Moderate Assistance - Patient 50 - 74%  Note: goal downgraded 02/25/19  Tub/ shower transfer goal discontinued as it is not a safe transfer at this time.   OT Short Term Goals Week 2:  OT Short Term Goal 1 (Week 2): Pt will complete 1 grooming task in standing for increased standing tolerance OT Short Term Goal 1 - Progress (Week 2): Met OT Short Term Goal 2 (Week 2): Pt will complete bathing with mod assist at sit > stand level OT Short Term Goal 2 - Progress (Week 2): Met OT Short Term Goal 3 (Week 2): Pt will complete UB dressing with  min A OT Short Term Goal 3 - Progress (Week 2): Progressing toward goal OT Short Term Goal 4 (Week 2): Pt will complete LB dressing with mod A Week 3:  OT Short Term Goal 1 (Week 3): STG=LTG secondary  to ELOS   Scott Mcdowell 02/25/2019, 6:29 AM

## 2019-02-25 NOTE — Plan of Care (Signed)
  Problem: RH Bathing Goal: LTG Patient will bathe all body parts with assist levels (OT) Description: LTG: Patient will bathe all body parts with assist levels (OT) Flowsheets (Taken 02/25/2019 1223) LTG: Pt will perform bathing with assistance level/cueing: (goal downgraded 02/25/19) Moderate Assistance - Patient 50 - 74% Note: goal downgraded 02/25/19   Problem: RH Dressing Goal: LTG Patient will perform upper body dressing (OT) Description: LTG Patient will perform upper body dressing with assist, with/without cues (OT). Flowsheets (Taken 02/25/2019 1223) LTG: Pt will perform upper body dressing with assistance level of: (goal downgraded 02/25/19) Moderate Assistance - Patient 50 - 74% Note: goal downgraded 02/25/19   Problem: RH Dressing Goal: LTG Patient will perform lower body dressing w/assist (OT) Description: LTG: Patient will perform lower body dressing with assist, with/without cues in positioning using equipment (OT) Flowsheets (Taken 02/25/2019 1223) LTG: Pt will perform lower body dressing with assistance level of: (goal downgraded 02/25/19) Moderate Assistance - Patient 50 - 74% Note: goal downgraded 02/25/19   Problem: RH Toileting Goal: LTG Patient will perform toileting task (3/3 steps) with assistance level (OT) Description: LTG: Patient will perform toileting task (3/3 steps) with assistance level (OT)  Flowsheets (Taken 02/25/2019 1223) LTG: Pt will perform toileting task (3/3 steps) with assistance level: (goal downgraded 02/25/19) Moderate Assistance - Patient 50 - 74% Note: goal downgraded 02/25/19   Problem: RH Toilet Transfers Goal: LTG Patient will perform toilet transfers w/assist (OT) Description: LTG: Patient will perform toilet transfers with assist, with/without cues using equipment (OT) Flowsheets (Taken 02/25/2019 1223) LTG: Pt will perform toilet transfers with assistance level of: (goal downgraded 02/25/19) Moderate Assistance - Patient 50 - 74% Note: goal downgraded  02/25/19

## 2019-02-25 NOTE — Progress Notes (Signed)
  [facility] Progress Note   Assessment/ Plan:   #Acute kidney injury now dialysis dependent and  progressed to ESRD.  He has TDC and AV fistula.  -He has right IJ TDC and left brachiocephalic AV fistula. -He has outpatient spot for TTS at [facility] at 12:15. -Status post HD 7.7, next planned 7/9.  He is on midodrine and a lower machine temp - increased LE edema--> increased time to allow for more UF  # Anemia due to CKD: Received Feraheme on 6/15 and then 4 doses of Ferrlecit started on 6/27.  Continue ESA- Aranesp 200 q thursday  Monitor hemoglobin.  # Secondary hyperparathyroidism: Phosphorus level acceptable. PTH 40.  Continue Renvela.  # Hypotension/volume: Continue midodrine to 15 mg 3 times daily.    #Right upper extremity DVT: On Eliquis per primary team.  #Plasma cell myeloma per oncology  Subjective:    Seen in room.  Walked 36 feet today.  For HD today after therapies   Objective:   BP (!) 99/52 (BP Location: Left Leg)   Pulse 86   Temp 98.6 F (37 C) (Oral)   Resp 16   Ht 6\' 2"  (1.88 m)   Wt 102.1 kg   SpO2 96%   BMI 28.90 kg/m   Physical Exam: Gen: older gentleman, NAD, sitting in chair  CVS:RRR Resp: clear Abd: + abd wall edema Ext: 2+ LE edema, improved ACCESS: L UE AVF + T/B and R IJ TDC   Labs: BMET Recent Labs  Lab 02/20/19 0509 02/20/19 1600 02/23/19 1404  NA 139 138 139  K 4.1 3.7 3.8  CL 100 100 99  CO2 26 25 26   GLUCOSE 92 118* 125*  BUN 65* 67* 66*  CREATININE 5.25* 5.42* 5.76*  CALCIUM 8.9 8.7* 9.3  PHOS 4.0 3.7 2.7   CBC Recent Labs  Lab 02/20/19 0509 02/20/19 1600 02/23/19 1404  WBC 5.6 5.4 6.1  HGB 8.7* 8.6* 8.5*  HCT 27.7* 27.9* 27.3*  MCV 103.4* 106.1* 106.2*  PLT 176 178 205    @IMGRELPRIORS @ Medications:    . sodium chloride   Intravenous Once  . apixaban  5 mg Oral BID  . Chlorhexidine Gluconate Cloth  6 each Topical Q0600  . Chlorhexidine Gluconate Cloth  6 each Topical Q0600  .  darbepoetin (ARANESP) injection - DIALYSIS  200 mcg Intravenous Q Thu-HD  . feeding supplement (NEPRO CARB STEADY)  237 mL Oral TID BM  . liver oil-zinc oxide   Topical 5 X Daily  . midodrine  15 mg Oral TID WC  . multivitamin  1 tablet Oral QHS  . nystatin   Topical TID  . pantoprazole  40 mg Oral BID  . polyethylene glycol  17 g Oral Daily  . senna-docusate  1 tablet Oral QHS  . sevelamer carbonate  2,400 mg Oral TID WC  . simethicone  80 mg Oral TID     Madelon Lips MD [facility] pgr [phone] 02/25/2019, 11:30 AM

## 2019-02-25 NOTE — Plan of Care (Signed)
  Problem: RH Bed Mobility Goal: LTG Patient will perform bed mobility with assist (PT) Description: LTG: Patient will perform bed mobility with assistance, with/without cues (PT). Flowsheets (Taken 02/25/2019 1644) LTG: Pt will perform bed mobility with assistance level of: (downgraded) -- Note: Goal downgraded due to body habitus and edema/weight of RLE   Problem: RH Ambulation Goal: LTG Patient will ambulate in controlled environment (PT) Description: LTG: Patient will ambulate in a controlled environment, # of feet with assistance (PT). Flowsheets (Taken 02/25/2019 1644) LTG: Pt will ambulate in controlled environ  assist needed:: (added gait goal) -- Note: Pt showing motor return RLE, and activity tolerance improving.

## 2019-02-25 NOTE — Progress Notes (Signed)
Physical Therapy Weekly Progress Note  Patient Details  Name: Scott Mcdowell MRN: [id number] Date of Birth: Nov 11, 1940  Beginning of progress report period:02/19/19 End of progress report period:02/25/19 Today's Date: 02/25/2019 PT Individual Time: 0900-1000 PT Individual Time Calculation (min): 60 min   Patient has met 2 of 4 short term goals.    Patient continues to demonstrate the following deficits: edema RUE, RLE, R trunk, muscle weakness, decreased cardiorespiratoy endurance, impaired timing and sequencing, abnormal tone, decreased coordination and decreased motor planning and decreased sitting balance, decreased standing balance, decreased postural control, hemiplegia and decreased balance strategies and therefore will continue to benefit from skilled PT intervention to increase functional independence with mobility.  Patient progressing toward long term goals.. Bed mobility LTG downgraded, due to body habitus and R sided edema/weight of limbs, and gait LTG added, yesterday Continue plan of care.  PT Short Term Goals   Week 2:  PT Short Term Goal 1 (Week 2): Patient will perform rolling in the bed and supine to sit with CGA. PT Short Term Goal 1 - Progress (Week 2): Progressing toward goal PT Short Term Goal 2 (Week 2): Patient will perform sit to supine with min A. PT Short Term Goal 2 - Progress (Week 2): Progressing toward goal PT Short Term Goal 3 (Week 2): Patient will perform level slide board transfers consistently with mod A of 1 person. PT Short Term Goal 3 - Progress (Week 2): Met PT Short Term Goal 4 (Week 2): Patient will initiate gait training. PT Short Term Goal 4 - Progress (Week 2): Met Week 3: = LTGs due to LOS Skilled Therapeutic Interventions/Progress Updates:   Pt sitting in tilt in space w/c. RLE with thigh high TED and ACE wraps to address edema/weight of LE. PT donned L shoe with heel lift, R shoe with innersole removed.  Pt reported that he has not  heard anything from his son regarding building a ramp.  PT reiterated that this is a necessity for pt to enter and exit house.  Pt stated that his dtr [name] will probably be most involved with his care at home, besides his wife.  Pt is aware of w/c eval tomorrow; he stated that he is only comfortable in the tilt in space w/c.   Slide board transfer with min assist to place board, initiate movement of hips.   Sit> supine with mod assist for bil LEs, to L.  On flat mat, rolling R with supervision. Rolling L with min assist for set-up of RLE into hook lying position, and cues to bring RUE across chest and place LLE into hook lying position.  L side lying> sitting, with mod assist for bil LEs; cues for technique for pushing trunk up with LUE.  In L side lying,neuromuscular re-education via multimodal cues for 10 x 1 R, then bil hip flexion/extension, with PT holding weight of LEs.  Also in L side lying, for gravity eliminated position,  with visual feedback, R wrist extension x 10.   Sit> stand +2 , min assist from both helpers.  In standing, wt shifting L><R, working on R knee stance stability.  Gait training with EVA x 35' including 2 turns to R.   At end of session, pt resting, tilted back in tilt in space w/c, with needs at hand.  Therapy Documentation Precautions:  Precautions Precautions: Fall Precaution Comments: R hemiparesis, h/o 1 fall in past 1 year; hemiparesis more apparent due to debility Restrictions Weight Bearing Restrictions: No  Pain:  unrated low pain painful during supine> sit; pt declined meds; resolved in a couple of minutes.       Therapy/Group: Individual Therapy  Ronna Herskowitz 02/25/2019, 10:16 AM

## 2019-02-25 NOTE — Patient Care Conference (Signed)
Inpatient RehabilitationTeam Conference and Plan of Care Update Date: 02/24/2019   Time: 2:30 PM    Patient Name: Scott Mcdowell      Medical Record Number: [id number]  Date of Birth: [birth year] Sex: Male         Room/Bed: 4M12C/4M12C-01 Payor Info: Payor: Marine scientist / Plan: [employer] MEDICARE / Product Type: *No Product type* /    Admitting Diagnosis: 5. Gen Team  Debility, malaise, 11-18 days  Admit Date/Time:  02/10/2019  4:33 PM Admission Comments: No comment available   Primary Diagnosis:  <principal problem not specified> Principal Problem: <principal problem not specified>  Patient Active Problem List   Diagnosis Date Noted  . Hemodialysis-associated hypotension   . Peripheral edema   . Labile blood pressure   . Orthostatic hypotension   . Steroid-induced hyperglycemia   . History of supraventricular tachycardia   . ESRD on dialysis ([place])   . History of GI bleed   . Debility 02/10/2019  . AKI (acute kidney injury) ([place])   . CKD (chronic kidney disease), stage IV ([place])   . 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 Pleural effusion   . History of CVA with residual deficit   . SVT (supraventricular tachycardia) ([place])   . Orthostasis   . ESRD (end stage renal disease) ([place])   . Gastrointestinal hemorrhage   . Pressure injury of skin 01/30/2019  . Goals of care, counseling/discussion 01/29/2019  . 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 Shock ([place]) 01/21/2019  . Acute blood loss anemia 01/21/2019  . Upper GI bleed 01/21/2019  . B12 deficiency anemia 01/15/2019  . Pleural effusion on right 01/15/2019  . Ascites 01/15/2019  . CAP (community acquired pneumonia) 01/15/2019  . Elevated troponin 01/15/2019  . Hypercalcemia 01/14/2019  . ARF (acute renal failure) ([place]) 01/14/2019  . Hyperkalemia 01/14/2019    Expected Discharge Date: Expected Discharge Date: 03/05/19  Team Members Present: Physician leading conference: Dr. Delice Lesch Social Worker Present: Ovidio Kin, LCSW Nurse Present: Judee Clara, LPN PT Present: Georjean Mode, PT OT Present: Willeen Cass, OT SLP Present: Charolett Bumpers, SLP PPS Coordinator present : Gunnar Fusi, SLP     Current Status/Progress Goal Weekly Team Focus  Medical   Debility secondary to acute renal failure/ascites/multi-medical  Improve mobility, transfers, self-care, edema  See above   Bowel/Bladder   Continent of urinary output when it occurs ( [place])  Continue Bowel program, maintain continence of bladder  Assess QS/PRN,   Swallow/Nutrition/ Hydration             ADL's   bathing-mod A; UB dressing-min A; LB dressing-max A; functional transfers-mod A with Stedy; mod A with slide board; sit<>stand-mod A/max A  min A overall  sit<>stand, functional transfers, BADL retraining, educaiton   Mobility   sit> supine max assist, supine> sit min assist, min slide board transfer, +2 sit> stand, gait EVA walker 14' mod assist  S transfers, min assist car tr, S w/c x 150', gait TBD  transfers, sit> stand, gait, activity tolerance, pt ed   Communication             Safety/Cognition/ Behavioral Observations            Pain   No c/o pain  Remain pain free <2  Assess qs/prn and address issues   Skin   Stage 1 bilateral heel healing no drainage,no new areas of skin tears, MASD improving to groins, buttock and sacral areas with  current treament plan, Air mattress bed in place  Maintain/continue plan prevent new areas of brakdown  Assess skin QS/PRN and provide current treament plans      *See Care Plan and progress notes for long and short-term goals.     Barriers to Discharge  Current Status/Progress Possible Resolutions Date Resolved   Physician    Medical stability;Hemodialysis     See above  Therapies, Nephro recs, follow labs, Onc recs      Nursing                  PT                    OT                  SLP                SW                Discharge  Planning/Teaching Needs:  Family aware will need 24 hr physcial care and a ramp for his steps at home. Will need to have come in next week for education prior to DC home      Team Discussion:  Making slow progress in his therapies. Weak on affected CVA side. MD watching BP and hemoglobin. Edema goes up and down dialysis affects this also. WC eval on Friday. Needs ramp and family education next week. Needs extension to reach goals.  Revisions to Treatment Plan:  DC 7/17 needs extension to reach goals    Continued Need for Acute Rehabilitation Level of Care: The patient requires daily medical management by a physician with specialized training in physical medicine and rehabilitation for the following conditions: Daily direction of a multidisciplinary physical rehabilitation program to ensure safe treatment while eliciting the highest outcome that is of practical value to the patient.: Yes Daily medical management of patient stability for increased activity during participation in an intensive rehabilitation regime.: Yes Daily analysis of laboratory values and/or radiology reports with any subsequent need for medication adjustment of medical intervention for : Renal problems;Other;Wound care problems   I attest that I was present, lead the team conference, and concur with the assessment and plan of the team. Teleconference held due to COVID 19   Lindy Pennisi, [place] 02/25/2019, 9:08 AM

## 2019-02-25 NOTE — Progress Notes (Signed)
Social Work Patient ID: Scott Mcdowell, male   DOB: 07-04-1941, 78 y.o.   MRN: [id number]  Spoke with [name] to discuss team conference goals and his need for a ramp and the need for education with all caregivers prior to discharge next Friday. She reports they will be providing care at home she and her sister their Mom can not really assist. She will get back with this worker regarding day can come in for therapies.

## 2019-02-26 ENCOUNTER — Inpatient Hospital Stay (HOSPITAL_COMMUNITY): Payer: Medicare Other | Admitting: *Deleted

## 2019-02-26 ENCOUNTER — Inpatient Hospital Stay (HOSPITAL_COMMUNITY): Payer: Medicare Other

## 2019-02-26 NOTE — Progress Notes (Signed)
Occupational Therapy Session Note  Patient Details  Name: Scott Mcdowell MRN: [id number] Date of Birth: 12/24/1940  Today's Date: 02/26/2019 OT Individual Time: 0700-0810 OT Individual Time Calculation (min): 70 min    Short Term Goals: Week 3:  OT Short Term Goal 1 (Week 3): STG=LTG secondary to ELOS  Skilled Therapeutic Interventions/Progress Updates:    Pt resting in bed upon arrival.  Pt voiced concerns that his R great toe might have ingrowned nail.  RN notifed and attended to pt.  OT intervention with focus on bed mobility, sit<>stand, sitting balance, AE use, BADL retraining, standing balance, and activity tolerance to increase independence with BADLs.   Supine>sit EOB and reposition-supervision LB dressing- max A with reacher Sit<>stand from EOB with Stedy-CGA Sit<>stand from Stedy-supervisoin UB dressing-min A Bathing-mod A  RUE edema improved, will remove coband this afternoon. RLE edema improved-thigh high Ted hose and ace wrap applied  Pt remained seated in TIS w/c with all needs within reach.  Therapy Documentation Precautions:  Precautions Precautions: Fall Precaution Comments: R hemiparesis, h/o 1 fall in past 1 year; hemiparesis more apparent due to debility Restrictions Weight Bearing Restrictions: No  Pain:  Pt denies pain this morning   Therapy/Group: Individual Therapy  [name] 02/26/2019, 8:13 AM

## 2019-02-26 NOTE — Progress Notes (Signed)
  [facility] Progress Note   Assessment/ Plan:   #Acute kidney injury now dialysis dependent and  progressed to ESRD.  He has TDC and AV fistula.  -He has outpatient spot for TTS at [facility] at 12:15. - Next planned HD 7/11 - increased LE edema--> increased time to allow for more UF, probing for EDW, weights are coming down- will f/u on yesterday's post-weight  # Anemia due to CKD: Received Feraheme on 6/15 and then 4 doses of Ferrlecit started on 6/27.  Continue ESA- Aranesp 200 q thursday   # Secondary hyperparathyroidism: Phosphorus level acceptable. PTH 40.  Continue Renvela.  # Hypotension/volume: Continue midodrine to 15 mg 3 times daily.    #Right upper extremity DVT: On Eliquis per primary team, Arm wrapped and it looks much better  #Plasma cell myeloma per oncology  #dispo: planned d/c 7/17  Subjective:    HD yesterday with 3L off.  Feels like swelling is greatly improving.  Tells me about his progress in CIR and shows me that now he can move his R upper extremity up and back where before he couldn't.     Objective:   BP 94/62 (BP Location: Left Leg)   Pulse 89   Temp 98.1 F (36.7 C)   Resp 16   Ht 6\' 2"  (1.88 m)   Wt 102.1 kg   SpO2 97%   BMI 28.90 kg/m   Physical Exam: Gen: older gentleman, NAD, sitting in wheelchair CVS:RRR Resp: clear Abd: + abd wall edema, improving Ext: 2+ LE edema, improving ACCESS: L UE AVF + T/B and R IJ [facility]   Labs: BMET Recent Labs  Lab 02/20/19 0509 02/20/19 1600 02/23/19 1404  NA 139 138 139  K 4.1 3.7 3.8  CL 100 100 99  CO2 26 25 26   GLUCOSE 92 118* 125*  BUN 65* 67* 66*  CREATININE 5.25* 5.42* 5.76*  CALCIUM 8.9 8.7* 9.3  PHOS 4.0 3.7 2.7   CBC Recent Labs  Lab 02/20/19 0509 02/20/19 1600 02/23/19 1404  WBC 5.6 5.4 6.1  HGB 8.7* 8.6* 8.5*  HCT 27.7* 27.9* 27.3*  MCV 103.4* 106.1* 106.2*  PLT 176 178 205    @IMGRELPRIORS @ Medications:    . sodium chloride   Intravenous Once  .  apixaban  5 mg Oral BID  . Chlorhexidine Gluconate Cloth  6 each Topical Q0600  . Chlorhexidine Gluconate Cloth  6 each Topical Q0600  . darbepoetin (ARANESP) injection - DIALYSIS  200 mcg Intravenous Q Thu-HD  . feeding supplement (NEPRO CARB STEADY)  237 mL Oral TID BM  . liver oil-zinc oxide   Topical 5 X Daily  . midodrine  15 mg Oral TID WC  . multivitamin  1 tablet Oral QHS  . nystatin   Topical TID  . pantoprazole  40 mg Oral BID  . polyethylene glycol  17 g Oral Daily  . senna-docusate  1 tablet Oral QHS  . sevelamer carbonate  2,400 mg Oral TID WC  . simethicone  80 mg Oral TID     Madelon Lips MD [facility] pgr [phone] 02/26/2019, 12:25 PM

## 2019-02-26 NOTE — Progress Notes (Signed)
Physical Therapy Session Note  Patient Details  Name: Scott Mcdowell MRN: [id number] Date of Birth: 23-Sep-1940  Today's Date: 02/26/2019 PT Individual Time: 0915-1030 PT Individual Time Calculation (min): 75 min   Short Term Goals: Week 3:  PT Short Term Goal 1 (Week 3): = LTGs due to LOS  Skilled Therapeutic Interventions/Progress Updates:     Patient in TIS w/c upon PT arrival. Patient alert and agreeable to PT session. Josh Cadle, ATP, joined session for w/c evaluation today. R UE and LE wrapped for edema control prior to and throughout session.   Therapeutic Activity: Transfers: Patient performed a slide board transfer from the TIS w/c to the manual w/c with CGA and set-up assist for board placement. He performed sit to/from stand x1 with a large chair in front for UE support with min A of 2 people, stood for 1 min with CGA of 1 person as w/c cushion was swapped out.  Wheelchair Mobility:  Patient propelled the manual wheelchair 100 feet and 60 feet with supervision using L UE and LE hemi-technique. Provided verbal cues for coordination of UE and LE and turning technique. Patient demonstrated improved sitting posture in manual chair, compared to last week, and demonstrated scooting back in the chair, one hip at a time, independently x2 during session. He sat in the chair for 20 min while Josh, ATP, took measurements and discussed ordering a lightweight manual chair, with a inclined seat for reduced anterior sliding, back support for improved sitting posture, removable arm rests, R ELR for edema control of R LE and L standard leg rest for increased support in sitting. PT and ATP discussed benefits of w/c for increased independence, mobility, and sitting posture when at home and in the community due to not yet being a functional ambulator. Patient stated he understood and agreed with w/c modifications. Expressed desire to continue working on ambulation with therapies, and PT in agreement  that ambulation will continue to be a goal as part of his POC for strengthening, increased functional mobility, and activity tolerance.   Patient in standard w/c at end of session with breaks locked, R lap tray in place with pillow for R UE edema control, and all needs within reach.    Therapy Documentation Precautions:  Precautions Precautions: Fall Precaution Comments: R hemiparesis, h/o 1 fall in past 1 year; hemiparesis more apparent due to debility Restrictions Weight Bearing Restrictions: No Pain: Patient denied pain throughout session.    Therapy/Group: Individual Therapy  Scott Mcdowell L Scott Mcdowell PT, DPT  02/26/2019, 3:47 PM

## 2019-02-26 NOTE — Progress Notes (Addendum)
[facility] PHYSICAL MEDICINE & REHABILITATION PROGRESS NOTE  Subjective/Complaints: Patient seen sitting up in his chair this morning.  Discussed with nursing ingrown toenail pain, however patient states that he believes it was positioning and pressure on his foot and does not want any further work-up or intervention at this time.  ROS: Denies CP, SOB, N/V/D  Objective: Vital Signs: Blood pressure 94/62, pulse 89, temperature 98.1 F (36.7 C), resp. rate 16, height 6\' 2"  (1.88 m), weight 102.1 kg, SpO2 97 %. No results found. Recent Labs    02/23/19 1404  WBC 6.1  HGB 8.5*  HCT 27.3*  PLT 205   Recent Labs    02/23/19 1404  NA 139  K 3.8  CL 99  CO2 26  GLUCOSE 125*  BUN 66*  CREATININE 5.76*  CALCIUM 9.3    Physical Exam: BP 94/62 (BP Location: Left Leg)   Pulse 89   Temp 98.1 F (36.7 C)   Resp 16   Ht 6\' 2"  (1.88 m)   Wt 102.1 kg   SpO2 97%   BMI 28.90 kg/m  Constitutional: No distress . Vital signs reviewed. HENT: Normocephalic. Atraumatic. Eyes: EOMI. No discharge. Cardiovascular: No JVD. Respiratory: Normal effort. GI: Non--distended. Musc: No edema or tenderness in extremities. Musc: Generalized edema, improving Neurological: Alert Follows commands Motor:  Right upper extremity: 3-/5 proximal distal, stable Right lower extremity: Hip flexion 2+/5, knee extension 2+/5, ankle dorsiflexion 3+/5, stable Skin: Skin is warm and dry.  Sacral ulcers Psychiatric: pleasant and cooperative  Assessment/Plan: 1. Functional deficits secondary to debility which require 3+ hours per day of interdisciplinary therapy in a comprehensive inpatient rehab setting.  Physiatrist is providing close team supervision and 24 hour management of active medical problems listed below.  Physiatrist and rehab team continue to assess barriers to discharge/monitor patient progress toward functional and medical goals  Care Tool:  Bathing    Body parts bathed by patient:  Chest, Abdomen, Right upper leg, Left upper leg, Face, Front perineal area   Body parts bathed by helper: Left arm, Buttocks, Right lower leg, Left lower leg Body parts n/a: Right arm   Bathing assist Assist Level: Moderate Assistance - Patient 50 - 74%     Upper Body Dressing/Undressing Upper body dressing   What is the patient wearing?: Pull over shirt    Upper body assist Assist Level: Minimal Assistance - Patient > 75%    Lower Body Dressing/Undressing Lower body dressing    Lower body dressing activity did not occur: Refused What is the patient wearing?: Pants, Incontinence brief     Lower body assist Assist for lower body dressing: Maximal Assistance - Patient 25 - 49%     Toileting Toileting    Toileting assist Assist for toileting: Total Assistance - Patient < 25%     Transfers Chair/bed transfer  Transfers assist     Chair/bed transfer assist level: Minimal Assistance - Patient > 75%     Locomotion Ambulation   Ambulation assist   Ambulation activity did not occur: Safety/medical concerns(weakness)  Assist level: Moderate Assistance - Patient 50 - 74% Assistive device: Other (comment)(L heel lift, R shoe cover) Max distance: 35   Walk 10 feet activity   Assist     Assist level: Moderate Assistance - Patient - 50 - 74% Assistive device: Walker-Eva   Walk 50 feet activity   Assist           Walk 150 feet activity   Assist Walk 150 feet  activity did not occur: Safety/medical concerns(weakness)         Walk 10 feet on uneven surface  activity   Assist           Wheelchair     Assist Will patient use wheelchair at discharge?: Yes Type of Wheelchair: Manual    Wheelchair assist level: Supervision/Verbal cueing Max wheelchair distance: 35'    Wheelchair 50 feet with 2 turns activity    Assist    Wheelchair 50 feet with 2 turns activity did not occur: Safety/medical concerns(weakness)       Wheelchair  150 feet activity     Assist Wheelchair 150 feet activity did not occur: Safety/medical concerns(weakness)          Medical Problem List and Plan: 1.  Debility secondary to acute renal failure/ascites/multi-medical  Continue CIR  Patient was seen for evaluation of ultralight weight manual wheelchair to improve mobility and completion of ADLs.  I have reviewed and concur with the physical therapy evaluation. 2.  Antithrombotics:   Right brachiocephalic DVT 08/27/2991: Eliquis             -antiplatelet therapy: N/A 3. Pain Management: Hydrocodone as needed 4. Mood: Provide emotional support             -antipsychotic agents: N/A 5. Neuropsych: This patient is capable of making decisions on his own behalf. 6. Skin/Wound Care/multiple decubiti sacrum/heels.:  Prevalon boots and follow-up wound care nurse routine skin checks 7. Fluids/Electrolytes/Nutrition: Routine in and outs.   8.  GI bleed.  EGD 01/21/2019.  Follow-up gastroenterology recommendations.    Hemoglobin 8.5 on 7/7, labs with HD  Added simethicone for gas 9.    Likely ESRDsecondary to light chain myeloma.  Status post left brachiocephalic AV fistula.  Hemodialysis Tuesday Thursday Saturday.    Hepatitis B neg  Appreciate nephro recs 10.  Plasma cell myeloma.  Outpatient follow-up with oncology to discuss formal chemotherapy.  Appreciate oncology recommendations  Recs per oncology, no changes at present 11.  Right pleural effusion.  Status post thoracentesis 01/16/2019. 12.  Orthostasis.  ProAmatine 10 mg 3 times daily.    Labile on 7/10  Monitor with increased mobility 13.  Episodic SVT.  8 beats noted 02/08/2019.  Amiodarone as directed.    Controlled on 7/10  Monitor with increased mobility 13.  History of CVA with recurrent right hemiparesis. 14.  Steroid-induced hyperglycemia  Slightly elevated on 7/7 15.  Peripheral edema Filed Weights   02/22/19 0526 02/23/19 1320 02/23/19 1625  Weight: 107.5 kg 103.4 kg 102.1  kg   ?  Reliability on 7/7, no repeat weights, daily weights ordered  Echo reviewed 12/2018-normal EF  Improved with HD  Continue HD   LOS: 16 days A FACE TO FACE EVALUATION WAS PERFORMED   Lorie Phenix 02/26/2019, 10:50 AM

## 2019-02-26 NOTE — Progress Notes (Signed)
Occupational Therapy Session Note  Patient Details  Name: Scott Mcdowell MRN: [id number] Date of Birth: [birth year]  Today's Date: 02/26/2019 OT Individual Time: 1345-1425 OT Individual Time Calculation (min): 40 min    Short Term Goals: Week 3:  OT Short Term Goal 1 (Week 3): STG=LTG secondary to ELOS  Skilled Therapeutic Interventions/Progress Updates:    Pt resting in standard w/c upon arrival.  OT intervention with focus on sit<>stand, standing balance, slide board transfers, and functional amb with [place] walker.   Sit<>stand from w/c with min A+2 Amb 11 feet in Eva walker approx 11' with min A Slide board transfer-CGA Sit>supine in bed with mod A  Pt remained in bed with all needs within reach and bed alarm activated.   Therapy Documentation Precautions:  Precautions Precautions: Fall Precaution Comments: R hemiparesis, h/o 1 fall in past 1 year; hemiparesis more apparent due to debility Restrictions Weight Bearing Restrictions: No   Pain:  Pt denies pain   Therapy/Group: Individual Therapy  [name] 02/26/2019, 2:40 PM

## 2019-02-26 NOTE — Progress Notes (Signed)
Social Work Patient ID: Scott Mcdowell, male   DOB: 05/19/1941, 78 y.o.   MRN: [id number] Have called daughter-Mary to try to arrange education next week in preparation of discharge next Friday. Will await her return call.

## 2019-02-26 NOTE — Progress Notes (Signed)
Renal Navigator received call from patient's wife stating that OP HD on 7/30 interferes with a follow up appointment with Vascular Surgeon. She asked if patient's HD can be moved to an earlier time on 7/30. Renal Navigator explained role and asked that she speak with clinic/[facility] directly for this request. Clinic phone number provided again. Patient's wife stated understanding and appreciation.   Alphonzo Cruise, [place] Renal Navigator [phone]

## 2019-02-27 LAB — RENAL FUNCTION PANEL
Albumin: 3.3 g/dL — ABNORMAL LOW (ref 3.5–5.0)
Anion gap: 11 (ref 5–15)
BUN: 51 mg/dL — ABNORMAL HIGH (ref 8–23)
CO2: 28 mmol/L (ref 22–32)
Calcium: 9.3 mg/dL (ref 8.9–10.3)
Chloride: 100 mmol/L (ref 98–111)
Creatinine, Ser: 4.65 mg/dL — ABNORMAL HIGH (ref 0.61–1.24)
GFR calc Af Amer: 13 mL/min — ABNORMAL LOW (ref >60)
GFR calc non Af Amer: 11 mL/min — ABNORMAL LOW (ref >60)
Glucose, Bld: 105 mg/dL — ABNORMAL HIGH (ref 70–99)
Phosphorus: 2.6 mg/dL (ref 2.5–4.6)
Potassium: 4 mmol/L (ref 3.5–5.1)
Sodium: 139 mmol/L (ref 135–145)

## 2019-02-27 LAB — CBC
HCT: 26.8 % — ABNORMAL LOW (ref 39.0–52.0)
Hemoglobin: 8.3 g/dL — ABNORMAL LOW (ref 13.0–17.0)
MCH: 32.7 pg (ref 26.0–34.0)
MCHC: 31 g/dL (ref 30.0–36.0)
MCV: 105.5 fL — ABNORMAL HIGH (ref 80.0–100.0)
Platelets: 212 10*3/uL (ref 150–400)
RBC: 2.54 MIL/uL — ABNORMAL LOW (ref 4.22–5.81)
RDW: 19 % — ABNORMAL HIGH (ref 11.5–15.5)
WBC: 6.3 10*3/uL (ref 4.0–10.5)
nRBC: 0.3 % — ABNORMAL HIGH (ref 0.0–0.2)

## 2019-02-27 MED ORDER — MIDODRINE HCL 5 MG PO TABS
ORAL_TABLET | ORAL | Status: AC
  Filled 2019-02-27: qty 3

## 2019-02-27 MED ORDER — SODIUM CHLORIDE 0.9 % IV SOLN: 100.0000 mL | INTRAVENOUS | Status: DC | PRN

## 2019-02-27 MED ORDER — ALTEPLASE 2 MG IJ SOLR
2.0000 mg | Freq: Once | INTRAMUSCULAR | Status: DC | PRN
  Filled 2019-02-27: qty 2

## 2019-02-27 MED ORDER — PENTAFLUOROPROP-TETRAFLUOROETH EX AERO: 1.0000 "application " | INHALATION_SPRAY | CUTANEOUS | Status: DC | PRN

## 2019-02-27 MED ORDER — HEPARIN SODIUM (PORCINE) 1000 UNIT/ML IJ SOLN
INTRAMUSCULAR | Status: AC
  Filled 2019-02-27: qty 4

## 2019-02-27 MED ORDER — LIDOCAINE HCL (PF) 1 % IJ SOLN: 5.0000 mL | INTRAMUSCULAR | Status: DC | PRN

## 2019-02-27 MED ORDER — HEPARIN SODIUM (PORCINE) 1000 UNIT/ML DIALYSIS
1000.0000 [IU] | INTRAMUSCULAR | Status: DC | PRN
  Administered 2019-02-27: 17:00:00 1000 [IU] via INTRAVENOUS_CENTRAL

## 2019-02-27 MED ORDER — LIDOCAINE-PRILOCAINE 2.5-2.5 % EX CREA: 1.0000 "application " | TOPICAL_CREAM | CUTANEOUS | Status: DC | PRN

## 2019-02-27 NOTE — Progress Notes (Signed)
  [facility] Progress Note   Assessment/ Plan:   #Acute kidney injury now dialysis dependent and  progressed to ESRD.  He has TDC and AV fistula.  -He has outpatient spot for TTS at [facility] at 12:15. - Next planned HD 7/11 - increased LE edema--> increased time to allow for more UF, probing for EDW, weights are coming down- probe for EDW  # Anemia due to CKD: Received Feraheme on 6/15 and then 4 doses of Ferrlecit started on 6/27.  Continue ESA- Aranesp 200 q thursday   # Secondary hyperparathyroidism: Phosphorus level acceptable. PTH 40.  Continue Renvela.  # Hypotension/volume: Continue midodrine to 15 mg 3 times daily.    #Right upper extremity DVT: On Eliquis per primary team, Arm wrapped and it looks much better  #Plasma cell myeloma per oncology  #dispo: planned d/c 7/17  Subjective:    For HD today.  No therapies today- rest day.      Objective:   BP (!) 99/39 (BP Location: Left Leg)   Pulse 91   Temp 98 F (36.7 C)   Resp 20   Ht 6\' 2"  (1.88 m)   Wt 103.9 kg   SpO2 96%   BMI 29.40 kg/m   Physical Exam: Gen: older gentleman, NAD, sitting in bed CVS:RRR Resp: clear Abd: + abd wall edema, improving Ext: 2+ LE edema, improving ACCESS: L UE AVF + T/B and R IJ TDC   Labs: BMET Recent Labs  Lab 02/20/19 1600 02/23/19 1404  NA 138 139  K 3.7 3.8  CL 100 99  CO2 25 26  GLUCOSE 118* 125*  BUN 67* 66*  CREATININE 5.42* 5.76*  CALCIUM 8.7* 9.3  PHOS 3.7 2.7   CBC Recent Labs  Lab 02/20/19 1600 02/23/19 1404  WBC 5.4 6.1  HGB 8.6* 8.5*  HCT 27.9* 27.3*  MCV 106.1* 106.2*  PLT 178 205    @IMGRELPRIORS @ Medications:    . sodium chloride   Intravenous Once  . apixaban  5 mg Oral BID  . Chlorhexidine Gluconate Cloth  6 each Topical Q0600  . Chlorhexidine Gluconate Cloth  6 each Topical Q0600  . darbepoetin (ARANESP) injection - DIALYSIS  200 mcg Intravenous Q Thu-HD  . feeding supplement (NEPRO CARB STEADY)  237 mL Oral TID  BM  . liver oil-zinc oxide   Topical 5 X Daily  . midodrine  15 mg Oral TID WC  . multivitamin  1 tablet Oral QHS  . nystatin   Topical TID  . pantoprazole  40 mg Oral BID  . polyethylene glycol  17 g Oral Daily  . senna-docusate  1 tablet Oral QHS  . sevelamer carbonate  2,400 mg Oral TID WC  . simethicone  80 mg Oral TID     Madelon Lips MD [facility] pgr [phone] 02/27/2019, 9:36 AM

## 2019-02-27 NOTE — Progress Notes (Signed)
[facility] PHYSICAL MEDICINE & REHABILITATION PROGRESS NOTE  Subjective/Complaints: Patient seen laying in bed this morning.  He states he slept well overnight.  Does not therapies today, encourage patient to get out of bed and exercise in bed.  He is seen by nephrology yesterday, notes reviewed.  ROS: Denies CP, SOB, N/V/D  Objective: Vital Signs: Blood pressure (!) 99/39, pulse 91, temperature 98 F (36.7 C), resp. rate 20, height 6\' 2"  (1.88 m), weight 103.9 kg, SpO2 96 %. No results found. No results for input(s): WBC, HGB, HCT, PLT in the last 72 hours. No results for input(s): NA, K, CL, CO2, GLUCOSE, BUN, CREATININE, CALCIUM in the last 72 hours.  Physical Exam: BP (!) 99/39 (BP Location: Left Leg)   Pulse 91   Temp 98 F (36.7 C)   Resp 20   Ht 6\' 2"  (1.88 m)   Wt 103.9 kg   SpO2 96%   BMI 29.40 kg/m  Constitutional: No distress . Vital signs reviewed. HENT: Normocephalic.  Atraumatic. Eyes: EOMI.  No discharge. Cardiovascular: No JVD. Respiratory: Normal effort. GI: Non--distended. Musc: No edema or tenderness in extremities. Musc: Generalized edema, improving Neurological: Alert Follows commands Motor:  Right upper extremity: 3-/5 proximal distal, unchanged Right lower extremity: Hip flexion 2+/5, knee extension 2+/5, ankle dorsiflexion 3+/5, unchanged Skin: Skin is warm and dry.  Sacral ulcers Psychiatric: pleasant and cooperative  Assessment/Plan: 1. Functional deficits secondary to debility which require 3+ hours per day of interdisciplinary therapy in a comprehensive inpatient rehab setting.  Physiatrist is providing close team supervision and 24 hour management of active medical problems listed below.  Physiatrist and rehab team continue to assess barriers to discharge/monitor patient progress toward functional and medical goals  Care Tool:  Bathing    Body parts bathed by patient: Chest, Abdomen, Right upper leg, Left upper leg, Face, Front  perineal area   Body parts bathed by helper: Left arm, Buttocks, Right lower leg, Left lower leg Body parts n/a: Right arm   Bathing assist Assist Level: Moderate Assistance - Patient 50 - 74%     Upper Body Dressing/Undressing Upper body dressing   What is the patient wearing?: Pull over shirt    Upper body assist Assist Level: Minimal Assistance - Patient > 75%    Lower Body Dressing/Undressing Lower body dressing    Lower body dressing activity did not occur: Refused What is the patient wearing?: Pants, Incontinence brief     Lower body assist Assist for lower body dressing: Maximal Assistance - Patient 25 - 49%     Toileting Toileting    Toileting assist Assist for toileting: Total Assistance - Patient < 25%     Transfers Chair/bed transfer  Transfers assist     Chair/bed transfer assist level: Contact Guard/Touching assist     Locomotion Ambulation   Ambulation assist   Ambulation activity did not occur: Safety/medical concerns(weakness)  Assist level: Moderate Assistance - Patient 50 - 74% Assistive device: Other (comment)(L heel lift, R shoe cover) Max distance: 35   Walk 10 feet activity   Assist     Assist level: Moderate Assistance - Patient - 50 - 74% Assistive device: Walker-Eva   Walk 50 feet activity   Assist           Walk 150 feet activity   Assist Walk 150 feet activity did not occur: Safety/medical concerns(weakness)         Walk 10 feet on uneven surface  activity   Assist  Wheelchair     Assist Will patient use wheelchair at discharge?: Yes Type of Wheelchair: Manual    Wheelchair assist level: Supervision/Verbal cueing, Set up assist Max wheelchair distance: 100'    Wheelchair 50 feet with 2 turns activity    Assist    Wheelchair 50 feet with 2 turns activity did not occur: Safety/medical concerns(weakness)   Assist Level: Set up assist, Supervision/Verbal cueing   Wheelchair  150 feet activity     Assist Wheelchair 150 feet activity did not occur: Safety/medical concerns(weakness)          Medical Problem List and Plan: 1.  Debility secondary to acute renal failure/ascites/multi-medical  Continue CIR 2.  Antithrombotics:   Right brachiocephalic DVT 08/24/1094: Eliquis             -antiplatelet therapy: N/A 3. Pain Management: Hydrocodone as needed 4. Mood: Provide emotional support             -antipsychotic agents: N/A 5. Neuropsych: This patient is capable of making decisions on his own behalf. 6. Skin/Wound Care/multiple decubiti sacrum/heels.:  Prevalon boots and follow-up wound care nurse routine skin checks 7. Fluids/Electrolytes/Nutrition: Routine in and outs.   8.  GI bleed.  EGD 01/21/2019.  Follow-up gastroenterology recommendations.    Hemoglobin 8.5 on 7/7, labs with HD, plan for session today  Added simethicone for gas 9.    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  Status post left brachiocephalic AV fistula.  Hemodialysis Tuesday Thursday Saturday.    Hepatitis B neg  Appreciate nephro recs 10.  Plasma cell myeloma.  Outpatient follow-up with oncology to discuss formal chemotherapy.  Appreciate oncology recommendations  Recs per oncology, no changes at present 11.  Right pleural effusion.  Status post thoracentesis 01/16/2019. 12.  Orthostasis.  ProAmatine 10 mg 3 times daily.    Blood pressure low, but asymptomatic on 7/11  Monitor with increased mobility 13.  Episodic SVT: Resolved  8 beats noted 02/08/2019, no repeat episodes since that time  Monitor with increased mobility 13.  History of CVA with recurrent right hemiparesis. 14.  Steroid-induced hyperglycemia  Slightly elevated on 7/7 15.  Peripheral edema Filed Weights   02/23/19 1320 02/23/19 1625 02/27/19 0543  Weight: 103.4 kg 102.1 kg 103.9 kg   Stable on 7/11  Echo reviewed 12/2018-normal EF  Improved with HD  Continue HD   LOS: 17 days A FACE TO FACE EVALUATION  WAS PERFORMED  Jesenya Bowditch Lorie Phenix 02/27/2019, 9:40 AM

## 2019-02-28 ENCOUNTER — Inpatient Hospital Stay (HOSPITAL_COMMUNITY): Payer: Medicare Other | Admitting: Occupational Therapy

## 2019-02-28 LAB — MRSA PCR SCREENING: MRSA by PCR: NEGATIVE

## 2019-02-28 NOTE — Progress Notes (Signed)
Occupational Therapy Session Note  Patient Details  Name: Scott Mcdowell MRN: [id number] Date of Birth: 06-20-1941  Today's Date: 02/28/2019 OT Individual Time: 1100-1200 OT Individual Time Calculation (min): 60 min    Short Term Goals: Week 3:  OT Short Term Goal 1 (Week 3): STG=LTG secondary to ELOS  Skilled Therapeutic Interventions/Progress Updates:    Treatment session with focus on functional transfers and sit <> stand.  Pt received upright in w/c declining bathing/dressing.  Discussed pt goals in regards to self-care tasks and mobility prior to d/c home.  Pt reports sit > stand is most challenging aspect at this time.  Transferred w/c <> therapy table with therapist placing slide board and pt able to complete with min assist when transferring out of w/c and CGA when transferring back to w/c.  Min question cues to increase carryover and awareness of placement of slide board prior to transfers.  Engaged in sit > stand from elevated therapy mat with use of Stedy for UE support and blocking at knees.  Pt required min-mod assist dependent on height of surface.  Engaged in mini squats with focus on BLE strengthening as needed for sit > stand for hygiene and clothing management.  Engaged in reaching task to challenge balance without BUE support as needed for clothing management.  Utilized UE Ranger with focus on RUE ROM in various planes, therapist providing tactile cues/support at elbow during ROM.  Returned to room and transferred back to bed via slide board as above.   Therapy Documentation Precautions:  Precautions Precautions: Fall Precaution Comments: R hemiparesis, h/o 1 fall in past 1 year; hemiparesis more apparent due to debility Restrictions Weight Bearing Restrictions: No Pain: Pain Assessment Pain Scale: 0-10 Pain Score: 0-No pain   Therapy/Group: Individual Therapy  Simonne Come 02/28/2019, 12:23 PM

## 2019-02-28 NOTE — Progress Notes (Signed)
  [facility] Progress Note   Assessment/ Plan:   #Acute kidney injury now dialysis dependent and  progressed to ESRD.  He has TDC and AV fistula.  -He has outpatient spot for TTS at [facility] at 12:15. - Next planned HD 7/14 - have been using 3K bath - increased LE edema--> increased time to allow for more UF, weights are coming down- probe for EDW  # Anemia due to CKD: Received Feraheme on 6/15 and then 4 doses of Ferrlecit started on 6/27.  Continue ESA- Aranesp 200 q thursday   # Secondary hyperparathyroidism: Phosphorus level acceptable. PTH 40.  Continue Renvela.  # Hypotension/volume: Continue midodrine to 15 mg 3 times daily.    #Right upper extremity DVT: On Eliquis per primary team, Arm wrapped and it looks much better  #Plasma cell myeloma per oncology  #dispo: planned d/c 7/17  Subjective:    HD completed yesterday with 3.5L off.  Pt is feeling even better this AM- stood up by himself from lift chair.     Objective:   BP (!) 118/55 (BP Location: Left Leg)   Pulse 89   Temp 98.6 F (37 C) (Oral)   Resp 18   Ht 6\' 2"  (1.88 m)   Wt 100.5 kg   SpO2 96%   BMI 28.45 kg/m   Physical Exam: Gen: older gentleman, NAD, sitting in wheelchair CVS:RRR Resp: clear Abd: + trace wall edema, significantly improved Ext: 1+ LE edema, significantly improved ACCESS: L UE AVF + T/B and R IJ TDC   Labs: BMET Recent Labs  Lab 02/23/19 1404 02/27/19 1424  NA 139 139  K 3.8 4.0  CL 99 100  CO2 26 28  GLUCOSE 125* 105*  BUN 66* 51*  CREATININE 5.76* 4.65*  CALCIUM 9.3 9.3  PHOS 2.7 2.6   CBC Recent Labs  Lab 02/23/19 1404 02/27/19 1423  WBC 6.1 6.3  HGB 8.5* 8.3*  HCT 27.3* 26.8*  MCV 106.2* 105.5*  PLT 205 212    @IMGRELPRIORS @ Medications:    . sodium chloride   Intravenous Once  . apixaban  5 mg Oral BID  . Chlorhexidine Gluconate Cloth  6 each Topical Q0600  . Chlorhexidine Gluconate Cloth  6 each Topical Q0600  . darbepoetin  (ARANESP) injection - DIALYSIS  200 mcg Intravenous Q Thu-HD  . feeding supplement (NEPRO CARB STEADY)  237 mL Oral TID BM  . liver oil-zinc oxide   Topical 5 X Daily  . midodrine  15 mg Oral TID WC  . multivitamin  1 tablet Oral QHS  . nystatin   Topical TID  . pantoprazole  40 mg Oral BID  . polyethylene glycol  17 g Oral Daily  . senna-docusate  1 tablet Oral QHS  . sevelamer carbonate  2,400 mg Oral TID WC  . simethicone  80 mg Oral TID     Madelon Lips MD [facility] pgr [phone] 02/28/2019, 9:46 AM

## 2019-02-28 NOTE — Progress Notes (Addendum)
Scott Mcdowell PHYSICAL MEDICINE & REHABILITATION PROGRESS NOTE  Subjective/Complaints: Patient seen sitting up in bed this morning.  He states he slept well overnight.  He asks if he is able to speak to a dietitian regarding nutrition at discharge.  ROS: Denies CP, SOB, N/V/D  Objective: Vital Signs: Blood pressure (!) 118/55, pulse 89, temperature 98.6 F (37 C), temperature source Oral, resp. rate 18, height 6\' 2"  (1.88 m), weight 100.5 kg, SpO2 96 %. No results found. Recent Labs    02/27/19 1423  WBC 6.3  HGB 8.3*  HCT 26.8*  PLT 212   Recent Labs    02/27/19 1424  NA 139  K 4.0  CL 100  CO2 28  GLUCOSE 105*  BUN 51*  CREATININE 4.65*  CALCIUM 9.3    Physical Exam: BP (!) 118/55 (BP Location: Left Leg)   Pulse 89   Temp 98.6 F (37 C) (Oral)   Resp 18   Ht 6\' 2"  (1.88 m)   Wt 100.5 kg   SpO2 96%   BMI 28.45 kg/m  Constitutional: No distress . Vital signs reviewed. HENT: Normocephalic.  Atraumatic. Eyes: EOMI.  No discharge. Cardiovascular: No JVD. Respiratory: Normal effort. GI: Non-distended. Musc: No edema or tenderness in extremities. Musc: Generalized edema, improving Neurological: Alert Follows commands Motor:  Right upper extremity: 3-/5 proximal distal, stable Right lower extremity: Hip flexion 2+/5, knee extension 2+/5, ankle dorsiflexion 3+/5, stable Skin: Skin is warm and dry.  Sacral ulcers Psychiatric: pleasant and cooperative  Assessment/Plan: 1. Functional deficits secondary to debility which require 3+ hours per day of interdisciplinary therapy in a comprehensive inpatient rehab setting.  Physiatrist is providing close team supervision and 24 hour management of active medical problems listed below.  Physiatrist and rehab team continue to assess barriers to discharge/monitor patient progress toward functional and medical goals  Care Tool:  Bathing    Body parts bathed by patient: Chest, Abdomen, Right upper leg, Left upper leg,  Face, Front perineal area   Body parts bathed by helper: Left arm, Buttocks, Right lower leg, Left lower leg Body parts n/a: Right arm   Bathing assist Assist Level: Moderate Assistance - Patient 50 - 74%     Upper Body Dressing/Undressing Upper body dressing   What is the patient wearing?: Pull over shirt    Upper body assist Assist Level: Minimal Assistance - Patient > 75%    Lower Body Dressing/Undressing Lower body dressing    Lower body dressing activity did not occur: Refused What is the patient wearing?: Pants, Incontinence brief     Lower body assist Assist for lower body dressing: Maximal Assistance - Patient 25 - 49%     Toileting Toileting    Toileting assist Assist for toileting: Total Assistance - Patient < 25%     Transfers Chair/bed transfer  Transfers assist     Chair/bed transfer assist level: Contact Guard/Touching assist     Locomotion Ambulation   Ambulation assist   Ambulation activity did not occur: Safety/medical concerns(weakness)  Assist level: Moderate Assistance - Patient 50 - 74% Assistive device: Other (comment)(L heel lift, R shoe cover) Max distance: 35   Walk 10 feet activity   Assist     Assist level: Moderate Assistance - Patient - 50 - 74% Assistive device: Walker-Eva   Walk 50 feet activity   Assist           Walk 150 feet activity   Assist Walk 150 feet activity did not occur: Safety/medical concerns(weakness)  Walk 10 feet on uneven surface  activity   Assist           Wheelchair     Assist Will patient use wheelchair at discharge?: Yes Type of Wheelchair: Manual    Wheelchair assist level: Supervision/Verbal cueing, Set up assist Max wheelchair distance: 100'    Wheelchair 50 feet with 2 turns activity    Assist    Wheelchair 50 feet with 2 turns activity did not occur: Safety/medical concerns(weakness)   Assist Level: Set up assist, Supervision/Verbal cueing    Wheelchair 150 feet activity     Assist Wheelchair 150 feet activity did not occur: Safety/medical concerns(weakness)          Medical Problem List and Plan: 1.  Debility secondary to acute renal failure/ascites/multi-medical  Continue CIR 2.  Antithrombotics:   Right brachiocephalic DVT 03/19/2750: Eliquis             -antiplatelet therapy: N/A 3. Pain Management: Hydrocodone as needed 4. Mood: Provide emotional support             -antipsychotic agents: N/A 5. Neuropsych: This patient is capable of making decisions on his own behalf. 6. Skin/Wound Care/multiple decubiti sacrum/heels.:  Prevalon boots and follow-up wound care nurse routine skin checks 7. Fluids/Electrolytes/Nutrition: Routine in and outs.   8.  GI bleed.  EGD 01/21/2019.  Follow-up gastroenterology recommendations.    Hemoglobin 8.3 on 7/11, labs with HD  Added simethicone for gas 9.    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  Status post left brachiocephalic AV fistula.  Hemodialysis Tuesday Thursday Saturday.    Hepatitis B neg  Appreciate nephro recs  Dietitian consult ordered 10.  Plasma cell myeloma.  Outpatient follow-up with oncology to discuss formal chemotherapy.  Appreciate oncology recommendations  Recs per oncology, no changes at present 11.  Right pleural effusion.  Status post thoracentesis 01/16/2019. 12.  Orthostasis.  ProAmatine 10 mg 3 times daily.    Relatively controlled on 7/12  Monitor with increased mobility 13.  Episodic SVT: Resolved  8 beats noted 02/08/2019, no repeat episodes since that time  Monitor with increased mobility 13.  History of CVA with recurrent right hemiparesis. 14.  Steroid-induced hyperglycemia  Slightly elevated on 7/11 15.  Peripheral edema Filed Weights   02/27/19 1340 02/27/19 1715 02/28/19 0451  Weight: 103.9 kg 100.4 kg 100.5 kg   Improving on 7/12  Echo reviewed 12/2018-normal EF  Improved with HD  Continue HD   LOS: 18 days A FACE TO FACE  EVALUATION WAS PERFORMED  Ankit Lorie Phenix 02/28/2019, 9:35 AM

## 2019-03-01 ENCOUNTER — Inpatient Hospital Stay (HOSPITAL_COMMUNITY): Payer: Medicare Other

## 2019-03-01 ENCOUNTER — Inpatient Hospital Stay (HOSPITAL_COMMUNITY): Payer: Medicare Other | Admitting: *Deleted

## 2019-03-01 MED ORDER — CHLORHEXIDINE GLUCONATE CLOTH 2 % EX PADS
6.0000 | MEDICATED_PAD | Freq: Every day | CUTANEOUS | Status: DC
  Administered 2019-03-01 – 2019-03-05 (×4): 6 via TOPICAL

## 2019-03-01 NOTE — Progress Notes (Signed)
Social Work Patient ID: Scott Mcdowell, male   DOB: 22-Jul-1941, 78 y.o.   MRN: [id number]  Daughter-Mary returned this worker's call and have scheduled family education for Wed at 1:00. Do not feel daughter realizes the amount of care pt requires, but will se eon Wed. Pt feels he is improving daily but will not be supervision at discharge will still need physical care.

## 2019-03-01 NOTE — Progress Notes (Signed)
Brief Nutrition Note  RD working remotely.  RD consulted for diet education. Per MD note, pt requesting to speak with a dietitian regarding nutrition at discharge.  RD spoke with pt briefly via phone call to pt's room. RD offered to discuss nutrition with pt via phone call. Pt states, "I would rather have you here in person." RD offered to stop by pt's room between therapies tomorrow, 03/02/19, when RD is onsite. Pt states that this is the best plan for him.  RD will plan to provide nutrition education and associated handouts tomorrow, 03/02/19, in person.   Gaynell Face, MS, RD, LDN Inpatient Clinical Dietitian Pager: [phone] Weekend/After Hours: [phone]

## 2019-03-01 NOTE — Progress Notes (Addendum)
Physical Therapy Session Note  Patient Details  Name: Scott Mcdowell MRN: [id number] Date of Birth: 1940-10-08  Today's Date: 03/01/2019 PT Individual Time: 0905-1000, 1300-1400 PT Individual Time Calculation (min): 55 min , 60 min  Short Term Goals:  Week 3:  PT Short Term Goal 1 (Week 3): = LTGs due to LOS    Skilled Therapeutic Interventions/Progress Updates:   tx 1:  Pt sitting up in standard w/c.  No c/o pain.  W/c propulsion over level tile x 25' x 3, using hemi method, with max cues for decreasing use of LLE and increasing use of LUE.  Slide board transfer with min/mod assist to L.  Pt needed mod cues for w/c set up.  Scooting L/R in sitting with mod assist for principle of moving hips while unweighted.  RUE task, grasping horseshoes off of table in front of him, and passed to [place], RT, on his L. , x 7 without droppage.  Sit> stand took 2 attempts; demo and max cues for improved forward wt shift on 2nd bout, with min assist needed, to EVA . L heel lift and R shoe cover.  Gait x 30' x2  including turn to sit in armchair, mod assist and mod cues for R knee extension and longer R step length.  RUE edema significantly less than last week, but pt feels it has increased since previous therapist removed [place] for bathing. PT wrapped RUE with 2 4" ACES for continued edema control.  At end of session, pt resting in wc with needs at hand and seat belt alarm set.    tx 2:  Pt doziing in w/c.  He denied pain.  Slide board transfer w/c> mat slightly uphill to L, min assist , mod cues for set up and technique for head/hips.  Transfer training as above for transferring wt forward in sitting EOM.  Sit> stand required 2 attempts, to bari RW with R hand splint.  Pt benefitted from placing R hand in splint of RW before standing. 2nd sit> stand with min assist. Pt demonstrated upright posture with RW; he took 3 steps forward and backward with mod assist.  Mod assist for bil  LEs sit> supine.  In supine, neuromuscular re-education via positioning, multimodal cues for 10 x 2 each: L scapular protraction with extended elbows, cervical flexion, bil lower trunk rotation, 10 x 1 each: bil adductor squeezes during bridging, L scapular protraction with elbow extended.  With maxi slide under RLE, 10 x 1 heel slides with assistance to initiate.   Rolling L with set-up RLE and cues to bring RUE across chest, to sitting with min assist for RLE.  Sit> stand in [place] with min assist.  At end of session, pt resting in bed with needs at hand and bed alarm set.     Therapy Documentation Precautions:  Precautions Precautions: Fall Precaution Comments: R hemiparesis, h/o 1 fall in past 1 year; hemiparesis more apparent due to debility Restrictions Weight Bearing Restrictions: No  Pain: pt denied         Therapy/Group: Individual Therapy  [name] 03/01/2019, 10:58 AM

## 2019-03-01 NOTE — Progress Notes (Signed)
[facility] PHYSICAL MEDICINE & REHABILITATION PROGRESS NOTE  Subjective/Complaints: Patient seen sitting up in his chair working with therapy this morning.  He states he slept well overnight.  He is questions regarding his follow-up doctors appointment in dialysis scheduling.  He notes improvement in edema.  ROS: Denies CP, SOB, N/V/D  Objective: Vital Signs: Blood pressure (!) 137/48, pulse 91, temperature 98.7 F (37.1 C), temperature source Oral, resp. rate 20, height 6\' 2"  (1.88 m), weight 98 kg, SpO2 98 %. No results found. Recent Labs    02/27/19 1423  WBC 6.3  HGB 8.3*  HCT 26.8*  PLT 212   Recent Labs    02/27/19 1424  NA 139  K 4.0  CL 100  CO2 28  GLUCOSE 105*  BUN 51*  CREATININE 4.65*  CALCIUM 9.3    Physical Exam: BP (!) 137/48 (BP Location: Left Leg)   Pulse 91   Temp 98.7 F (37.1 C) (Oral)   Resp 20   Ht 6\' 2"  (1.88 m)   Wt 98 kg   SpO2 98%   BMI 27.73 kg/m  Constitutional: No distress . Vital signs reviewed. HENT: Normocephalic.  Atraumatic. Eyes: EOMI.  No discharge. Cardiovascular: No JVD. Respiratory: Normal effort. GI: Non-distended. Musc: No edema or tenderness in extremities. Musc: Generalized edema, improving Neurological: Alert Follows commands Motor:  Right upper extremity: 3-/5 proximal distal, unchanged Right lower extremity: Hip flexion 2+/5, knee extension 2+/5, ankle dorsiflexion 3+/5, unchanged Skin: Skin is warm and dry.  Sacral ulcers Psychiatric: pleasant and cooperative  Assessment/Plan: 1. Functional deficits secondary to debility which require 3+ hours per day of interdisciplinary therapy in a comprehensive inpatient rehab setting.  Physiatrist is providing close team supervision and 24 hour management of active medical problems listed below.  Physiatrist and rehab team continue to assess barriers to discharge/monitor patient progress toward functional and medical goals  Care Tool:  Bathing    Body parts  bathed by patient: Chest, Abdomen, Right upper leg, Left upper leg, Face, Front perineal area   Body parts bathed by helper: Left arm, Buttocks, Right lower leg, Left lower leg Body parts n/a: Right arm   Bathing assist Assist Level: Moderate Assistance - Patient 50 - 74%     Upper Body Dressing/Undressing Upper body dressing   What is the patient wearing?: Pull over shirt    Upper body assist Assist Level: Minimal Assistance - Patient > 75%    Lower Body Dressing/Undressing Lower body dressing    Lower body dressing activity did not occur: Refused What is the patient wearing?: Pants, Incontinence brief     Lower body assist Assist for lower body dressing: Maximal Assistance - Patient 25 - 49%     Toileting Toileting    Toileting assist Assist for toileting: Total Assistance - Patient < 25%     Transfers Chair/bed transfer  Transfers assist     Chair/bed transfer assist level: Contact Guard/Touching assist     Locomotion Ambulation   Ambulation assist   Ambulation activity did not occur: Safety/medical concerns(weakness)  Assist level: Moderate Assistance - Patient 50 - 74% Assistive device: Other (comment)(L heel lift, R shoe cover) Max distance: 35   Walk 10 feet activity   Assist     Assist level: Moderate Assistance - Patient - 50 - 74% Assistive device: Walker-Eva   Walk 50 feet activity   Assist           Walk 150 feet activity   Assist Walk 150 feet  activity did not occur: Safety/medical concerns(weakness)         Walk 10 feet on uneven surface  activity   Assist           Wheelchair     Assist Will patient use wheelchair at discharge?: Yes Type of Wheelchair: Manual    Wheelchair assist level: Supervision/Verbal cueing, Set up assist Max wheelchair distance: 100'    Wheelchair 50 feet with 2 turns activity    Assist    Wheelchair 50 feet with 2 turns activity did not occur: Safety/medical  concerns(weakness)   Assist Level: Set up assist, Supervision/Verbal cueing   Wheelchair 150 feet activity     Assist Wheelchair 150 feet activity did not occur: Safety/medical concerns(weakness)          Medical Problem List and Plan: 1.  Debility secondary to acute renal failure/ascites/multi-medical  Continue CIR 2.  Antithrombotics:   Right brachiocephalic DVT 04/21/2670: Eliquis             -antiplatelet therapy: N/A 3. Pain Management: Hydrocodone as needed 4. Mood: Provide emotional support             -antipsychotic agents: N/A 5. Neuropsych: This patient is capable of making decisions on his own behalf. 6. Skin/Wound Care/multiple decubiti sacrum/heels.:  Prevalon boots and follow-up wound care nurse routine skin checks 7. Fluids/Electrolytes/Nutrition: Routine in and outs.   8.  GI bleed.  EGD 01/21/2019.  Follow-up gastroenterology recommendations.    Hemoglobin 8.3 on 7/11, labs with HD  Added simethicone for gas 9.    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  Status post left brachiocephalic AV fistula.  Hemodialysis Tuesday Thursday Saturday.    Hepatitis B neg  Appreciate nephro recs  Dietitian consult, plans for follow-up tomorrow 10.  Plasma cell myeloma.  Outpatient follow-up with oncology to discuss formal chemotherapy.  Appreciate oncology recommendations  Recs per oncology, no changes at present 11.  Right pleural effusion.  Status post thoracentesis 01/16/2019. 12.  Orthostasis.  ProAmatine 10 mg 3 times daily.    Labile on 7/13  Monitor with increased mobility 13.  Episodic SVT: Resolved  8 beats noted 02/08/2019, no repeat episodes since that time  Monitor with increased mobility 13.  History of CVA with recurrent right hemiparesis. 14.  Steroid-induced hyperglycemia  Slightly elevated on 7/11 15.  Peripheral edema Filed Weights   02/27/19 1715 02/28/19 0451 03/01/19 0329  Weight: 100.4 kg 100.5 kg 98 kg   Improving on 7/13  Echo reviewed  12/2018-normal EF  Improved with HD  Continue HD   LOS: 19 days A FACE TO FACE EVALUATION WAS PERFORMED  Ankit Lorie Phenix 03/01/2019, 9:22 AM

## 2019-03-01 NOTE — Progress Notes (Signed)
Occupational Therapy Session Note  Patient Details  Name: Scott Mcdowell MRN: [id number] Date of Birth: 1940-12-25  Today's Date: 03/01/2019 OT Individual Time: 0938-1829 OT Individual Time Calculation (min): 75 min    Short Term Goals: Week 3:  OT Short Term Goal 1 (Week 3): STG=LTG secondary to ELOS  Skilled Therapeutic Interventions/Progress Updates:    OT intervention with focus on bed mobility, sit<>stand, toileting, dressing with sit<>stand, and activity tolerance to increase independence with BADLs. Coband removed from [place]. Thigh high Teds and ace wrap applied to RLE. Sit<>stand from EOB and w/c with min A.  Standing balance in [place] with CGA.  Pt required max A for toileting tasks.  Pt donned pullover shirt with min A. Pt remained in w/c with belt alarm activated and half lap tray in place.  All needs within reach.   Therapy Documentation Precautions:  Precautions Precautions: Fall Precaution Comments: R hemiparesis, h/o 1 fall in past 1 year; hemiparesis more apparent due to debility Restrictions Weight Bearing Restrictions: No  Pain:  Pt denies pain   Therapy/Group: Individual Therapy  [name] 03/01/2019, 8:16 AM

## 2019-03-01 NOTE — Progress Notes (Signed)
  [facility] Progress Note   Assessment/ Plan:   #Acute kidney injury now dialysis dependent and  progressed to ESRD.  He has TDC and AV fistula.  -He has outpatient spot for TTS at [facility] at 12:15. - Next planned HD 7/14 - have been using 3K bath - LE edema--> increased time to allow for more UF, weights are coming down- probe for EDW  # Anemia due to CKD: Received Feraheme on 6/15 and then 4 doses of Ferrlecit started on 6/27.  Continue ESA- Aranesp 200 q thursday   # Secondary hyperparathyroidism: Phosphorus level acceptable. PTH 40.  Continue Renvela.  # Hypotension/volume: Continue midodrine to 15 mg 3 times daily.    #Right upper extremity DVT: On Eliquis per primary team, Arm wrapped and it looks much better  #Plasma cell myeloma per oncology  #dispo: planned d/c 7/17  Subjective:    Last HD Saturday.  Today reports overall continued improvement in strength.  EDD still 7/17.   Objective:   BP (!) 137/48 (BP Location: Left Leg)   Pulse 91   Temp 98.7 F (37.1 C) (Oral)   Resp 20   Ht 6\' 2"  (1.88 m)   Wt 98 kg   SpO2 98%   BMI 27.73 kg/m   Physical Exam: Gen: older gentleman, NAD, sitting in wheelchair eating lunch CVS:RRR Resp: clear Abd: + trace wall edema, significantly improved Ext: 1+ LLE edema, significantly improved; RLE wrapped, unable to fully appreciate edema ACCESS: L UE AVF + T/B and R IJ TDC   Labs: BMET Recent Labs  Lab 02/23/19 1404 02/27/19 1424  NA 139 139  K 3.8 4.0  CL 99 100  CO2 26 28  GLUCOSE 125* 105*  BUN 66* 51*  CREATININE 5.76* 4.65*  CALCIUM 9.3 9.3  PHOS 2.7 2.6   CBC Recent Labs  Lab 02/23/19 1404 02/27/19 1423  WBC 6.1 6.3  HGB 8.5* 8.3*  HCT 27.3* 26.8*  MCV 106.2* 105.5*  PLT 205 212    @IMGRELPRIORS @ Medications:    . sodium chloride   Intravenous Once  . apixaban  5 mg Oral BID  . Chlorhexidine Gluconate Cloth  6 each Topical Q0600  . Chlorhexidine Gluconate Cloth  6 each  Topical Q0600  . darbepoetin (ARANESP) injection - DIALYSIS  200 mcg Intravenous Q Thu-HD  . feeding supplement (NEPRO CARB STEADY)  237 mL Oral TID BM  . liver oil-zinc oxide   Topical 5 X Daily  . midodrine  15 mg Oral TID WC  . multivitamin  1 tablet Oral QHS  . nystatin   Topical TID  . pantoprazole  40 mg Oral BID  . polyethylene glycol  17 g Oral Daily  . senna-docusate  1 tablet Oral QHS  . sevelamer carbonate  2,400 mg Oral TID WC  . simethicone  80 mg Oral TID    Jannifer Hick MD [facility] Pager [phone]

## 2019-03-02 ENCOUNTER — Inpatient Hospital Stay (HOSPITAL_COMMUNITY): Payer: Medicare Other | Admitting: *Deleted

## 2019-03-02 ENCOUNTER — Inpatient Hospital Stay (HOSPITAL_COMMUNITY): Payer: Medicare Other

## 2019-03-02 LAB — RENAL FUNCTION PANEL
Albumin: 3.6 g/dL (ref 3.5–5.0)
Anion gap: 13 (ref 5–15)
BUN: 63 mg/dL — ABNORMAL HIGH (ref 8–23)
CO2: 28 mmol/L (ref 22–32)
Calcium: 9.7 mg/dL (ref 8.9–10.3)
Chloride: 101 mmol/L (ref 98–111)
Creatinine, Ser: 5.58 mg/dL — ABNORMAL HIGH (ref 0.61–1.24)
GFR calc Af Amer: 10 mL/min — ABNORMAL LOW (ref >60)
GFR calc non Af Amer: 9 mL/min — ABNORMAL LOW (ref >60)
Glucose, Bld: 116 mg/dL — ABNORMAL HIGH (ref 70–99)
Phosphorus: 2.9 mg/dL (ref 2.5–4.6)
Potassium: 4 mmol/L (ref 3.5–5.1)
Sodium: 142 mmol/L (ref 135–145)

## 2019-03-02 LAB — CBC
HCT: 28 % — ABNORMAL LOW (ref 39.0–52.0)
Hemoglobin: 8.5 g/dL — ABNORMAL LOW (ref 13.0–17.0)
MCH: 32.7 pg (ref 26.0–34.0)
MCHC: 30.4 g/dL (ref 30.0–36.0)
MCV: 107.7 fL — ABNORMAL HIGH (ref 80.0–100.0)
Platelets: 217 10*3/uL (ref 150–400)
RBC: 2.6 MIL/uL — ABNORMAL LOW (ref 4.22–5.81)
RDW: 18.8 % — ABNORMAL HIGH (ref 11.5–15.5)
WBC: 5.6 10*3/uL (ref 4.0–10.5)
nRBC: 0.5 % — ABNORMAL HIGH (ref 0.0–0.2)

## 2019-03-02 MED ORDER — SODIUM CHLORIDE 0.9 % IV SOLN: 100.0000 mL | INTRAVENOUS | Status: DC | PRN

## 2019-03-02 MED ORDER — HEPARIN SODIUM (PORCINE) 1000 UNIT/ML DIALYSIS: 1000.0000 [IU] | INTRAMUSCULAR | Status: DC | PRN

## 2019-03-02 MED ORDER — LIDOCAINE HCL (PF) 1 % IJ SOLN: 5.0000 mL | INTRAMUSCULAR | Status: DC | PRN

## 2019-03-02 MED ORDER — PENTAFLUOROPROP-TETRAFLUOROETH EX AERO: 1.0000 "application " | INHALATION_SPRAY | CUTANEOUS | Status: DC | PRN

## 2019-03-02 MED ORDER — HEPARIN SODIUM (PORCINE) 1000 UNIT/ML IJ SOLN
INTRAMUSCULAR | Status: AC
  Filled 2019-03-02: qty 4

## 2019-03-02 MED ORDER — LIDOCAINE-PRILOCAINE 2.5-2.5 % EX CREA: 1.0000 "application " | TOPICAL_CREAM | CUTANEOUS | Status: DC | PRN

## 2019-03-02 MED ORDER — ALTEPLASE 2 MG IJ SOLR: 2.0000 mg | Freq: Once | INTRAMUSCULAR | Status: DC | PRN

## 2019-03-02 NOTE — Progress Notes (Signed)
  [facility] Progress Note   Assessment/ Plan:   #Acute kidney injury now dialysis dependent and  progressed to ESRD.  He has TDC and AV fistula.  -He has outpatient spot for TTS at [facility] at 12:15. - Next planned HD today - have been using 3K bath - LE edema--> improving with EDW adjustments  # Anemia due to CKD: Received Feraheme on 6/15 and then 4 doses of Ferrlecit started on 6/27.  Continue ESA- Aranesp 200 q thursday   # Secondary hyperparathyroidism: Phosphorus level acceptable. PTH 40.  Continue Renvela.  # Hypotension/volume: Continue midodrine to 15 mg 3 times daily.    #Right upper extremity DVT: On Eliquis per primary team  #Plasma cell myeloma per oncology  #dispo: planned d/c 7/17  Subjective:    Last HD Saturday > headed to HD now.  Today reports overall continued improvement in strength.  EDD still 7/17.   Objective:   BP (!) 107/54 (BP Location: Left Leg)   Pulse 81   Temp 98 F (36.7 C)   Resp 17   Ht 6\' 2"  (1.88 m)   Wt 101.2 kg   SpO2 96%   BMI 28.63 kg/m   Physical Exam: Gen: older gentleman, NAD, sitting in bed CVS:RRR Resp: clear Abd: + trace wall edema, significantly improved Ext: trace LLE edema, significantly improved; RLE wrapped, unable to fully appreciate edema ACCESS: L UE AVF + T/B and R IJ TDC   Labs: BMET Recent Labs  Lab 02/23/19 1404 02/27/19 1424  NA 139 139  K 3.8 4.0  CL 99 100  CO2 26 28  GLUCOSE 125* 105*  BUN 66* 51*  CREATININE 5.76* 4.65*  CALCIUM 9.3 9.3  PHOS 2.7 2.6   CBC Recent Labs  Lab 02/23/19 1404 02/27/19 1423  WBC 6.1 6.3  HGB 8.5* 8.3*  HCT 27.3* 26.8*  MCV 106.2* 105.5*  PLT 205 212    @IMGRELPRIORS @ Medications:    . sodium chloride   Intravenous Once  . apixaban  5 mg Oral BID  . Chlorhexidine Gluconate Cloth  6 each Topical Q0600  . Chlorhexidine Gluconate Cloth  6 each Topical Q0600  . Chlorhexidine Gluconate Cloth  6 each Topical Q0600  . darbepoetin  (ARANESP) injection - DIALYSIS  200 mcg Intravenous Q Thu-HD  . feeding supplement (NEPRO CARB STEADY)  237 mL Oral TID BM  . liver oil-zinc oxide   Topical 5 X Daily  . midodrine  15 mg Oral TID WC  . multivitamin  1 tablet Oral QHS  . nystatin   Topical TID  . pantoprazole  40 mg Oral BID  . polyethylene glycol  17 g Oral Daily  . senna-docusate  1 tablet Oral QHS  . sevelamer carbonate  2,400 mg Oral TID WC  . simethicone  80 mg Oral TID    Jannifer Hick MD [facility] Pager [phone]

## 2019-03-02 NOTE — Progress Notes (Signed)
Occupational Therapy Session Note  Patient Details  Name: [name] MRN: [id number] Date of Birth: May 14, 1941  Today's Date: 03/02/2019 OT Individual Time: 1115-1200 OT Individual Time Calculation (min): 45 min    Short Term Goals: Week 3:  OT Short Term Goal 1 (Week 3): STG=LTG secondary to ELOS  Skilled Therapeutic Interventions/Progress Updates:    OT intervention with focus on sitting balance, sit<>stand, slide board transfers, discharge planning, and AAROM exercises.  Pt resting in w/c upon arrival.  Pt educated on AAROM activities with RUE to increase function.  Pt return demonstrated with min verbal cues.  Discussed home setup.  Pt already owns [facility] which he was using PTA. Pt attempted to perform sit>stand from w/c in preparation for stand pivot transfer.  Pt required max A for sit>stand and pt declined attempt to step to bed.  Pt stated he was "pooped" from earlier PT session.  Pt performed slide board transfer to bed with min A.  Pt required max A for sit>supine.  Pt remained in bed with all needs within reach awaiting lunch and transport to HD.   Therapy Documentation Precautions:  Precautions Precautions: Fall Precaution Comments: R hemiparesis, h/o 1 fall in past 1 year; hemiparesis more apparent due to debility Restrictions Weight Bearing Restrictions: No  Pain:  Pt denies pain   Therapy/Group: Individual Therapy  [name] 03/02/2019, 12:04 PM

## 2019-03-02 NOTE — Progress Notes (Signed)
Physical Therapy Session Note  Patient Details  Name: Scott Mcdowell MRN: [id number] Date of Birth: 1941/01/04  Today's Date: 03/02/2019 PT Individual Time: 0900-1015 PT Individual Time Calculation (min): 75 min   Short Term Goals: Week 3:  PT Short Term Goal 1 (Week 3): = LTGs due to LOS  Skilled Therapeutic Interventions/Progress Updates:     Patient in w/c upon PT arrival. Patient alert and agreeable to PT session.  Therapeutic Activity: Transfers: Patient performed sit to/from stand x3 and stand pivot x1 using a bariatric RW with max A x3 and mod A x1. Provided verbal cues for leaning forward to stand.  Gait Training:  Patient ambulated 42 feet and 52 feet using a bariatric RW and R hand splint with min A with B tennis shoes donned with R sole removed and shoe cover for improved fit and R foot advancement and L heel lift.  Ambulated with step-to gait pattern leading with R, decreased initiation of R LE with fatigue, however remained able to advance without assist, and increased knee flexion in stance on R, without buckling. Provided verbal cues for R foot advancement, striking with his heel at initial contact with toes forward, and quad activation in stance of R.  Wheelchair Mobility:  Patient propelled wheelchair 85 feet with supervision. Provided verbal cues for increased use of UE x1.  Neuromuscular Re-ed: Patient performed sitting balance with weight bearing through his R UE performing horse shoe toss reaching with L UE far to the R to grab the horse shoes to promote R UE weight bearing with PT hand over his hand providing increased feedback for weight bearing. Cued patient to initiate weight bearing to R hand with scapular retraction to protect shoulder girdle.    Asked paitient about the status of a ramp at home, he was not sure, stating he had not asked his wife recently. He exxpressed concern about needing a w/c and ramp long term, stating he hoped to walking at home. PT  educated on multiple comorbidities slowing recovery and potential for fatigue with dialysis and potential therapies for new onset of plasma cell myeloma. Patient stated he was unaware of this diagnosis and had several questions. PT provided general education deferred all specific questions to [place], [place] who was informed and agreed to  Meet with the patient following his session.   Patient in w/c at end of session with breaks locked, seat belt alarm set, and all needs within reach.    Therapy Documentation Precautions:  Precautions Precautions: Fall Precaution Comments: R hemiparesis, h/o 1 fall in past 1 year; hemiparesis more apparent due to debility Restrictions Weight Bearing Restrictions: No  Pain: Pain Assessment Pain Scale: 0-10 Pain Score: 0-No pain    Therapy/Group: Individual Therapy  Balinda Heacock L Prather Failla PT, DPT  03/02/2019, 4:11 PM

## 2019-03-02 NOTE — Progress Notes (Signed)
[facility] PHYSICAL MEDICINE & REHABILITATION PROGRESS NOTE  Subjective/Complaints: Patient seen sitting up in his chair this AM.  He states he slept well overnight.  He has questions regarding appointments on 7/30.  He was seen by nephrology yesterday, notes reviewed.  ROS: Denies CP, SOB, N/V/D  Objective: Vital Signs: Blood pressure (!) 107/54, pulse 81, temperature 98 F (36.7 C), resp. rate 17, height 6\' 2"  (1.88 m), weight 101.2 kg, SpO2 96 %. No results found. Recent Labs    02/27/19 1423  WBC 6.3  HGB 8.3*  HCT 26.8*  PLT 212   Recent Labs    02/27/19 1424  NA 139  K 4.0  CL 100  CO2 28  GLUCOSE 105*  BUN 51*  CREATININE 4.65*  CALCIUM 9.3    Physical Exam: BP (!) 107/54 (BP Location: Left Leg)   Pulse 81   Temp 98 F (36.7 C)   Resp 17   Ht 6\' 2"  (1.88 m)   Wt 101.2 kg   SpO2 96%   BMI 28.63 kg/m  Constitutional: No distress . Vital signs reviewed. HENT: Normocephalic. Atraumatic.  Eyes: EOMI. No discharge. Cardiovascular: No JVD. Respiratory: Normal effort. GI: Non-distended. Musc: No edema or tenderness in extremities. Musc: Generalized edema, improving Neurological: Alert Follows commands Motor:  Right upper extremity: 3-/5 proximal distal, improving Right lower extremity: Hip flexion 2+/5, knee extension 2+/5, ankle dorsiflexion 3+/5, improving Skin: Skin is warm and dry.  Coban dressing Sacral ulcers Psychiatric: pleasant and cooperative  Assessment/Plan: 1. Functional deficits secondary to debility which require 3+ hours per day of interdisciplinary therapy in a comprehensive inpatient rehab setting.  Physiatrist is providing close team supervision and 24 hour management of active medical problems listed below.  Physiatrist and rehab team continue to assess barriers to discharge/monitor patient progress toward functional and medical goals  Care Tool:  Bathing    Body parts bathed by patient: Chest, Abdomen, Front perineal area,  Right upper leg, Left upper leg   Body parts bathed by helper: Left arm, Buttocks, Right lower leg, Left lower leg Body parts n/a: Right arm   Bathing assist Assist Level: Moderate Assistance - Patient 50 - 74%     Upper Body Dressing/Undressing Upper body dressing   What is the patient wearing?: Pull over shirt    Upper body assist Assist Level: Moderate Assistance - Patient 50 - 74%    Lower Body Dressing/Undressing Lower body dressing    Lower body dressing activity did not occur: Refused What is the patient wearing?: Pants, Incontinence brief     Lower body assist Assist for lower body dressing: Maximal Assistance - Patient 25 - 49%     Toileting Toileting    Toileting assist Assist for toileting: Total Assistance - Patient < 25%     Transfers Chair/bed transfer  Transfers assist     Chair/bed transfer assist level: Moderate Assistance - Patient 50 - 74%     Locomotion Ambulation   Ambulation assist   Ambulation activity did not occur: Safety/medical concerns(weakness)  Assist level: Moderate Assistance - Patient 50 - 74% Assistive device: Walker-Eva Max distance: 30   Walk 10 feet activity   Assist     Assist level: Moderate Assistance - Patient - 50 - 74% Assistive device: Walker-Eva   Walk 50 feet activity   Assist           Walk 150 feet activity   Assist Walk 150 feet activity did not occur: Safety/medical concerns(weakness)  Walk 10 feet on uneven surface  activity   Assist           Wheelchair     Assist Will patient use wheelchair at discharge?: Yes Type of Wheelchair: Manual    Wheelchair assist level: Supervision/Verbal cueing, Set up assist Max wheelchair distance: 100'    Wheelchair 50 feet with 2 turns activity    Assist    Wheelchair 50 feet with 2 turns activity did not occur: Safety/medical concerns(weakness)   Assist Level: Set up assist, Supervision/Verbal cueing   Wheelchair  150 feet activity     Assist Wheelchair 150 feet activity did not occur: Safety/medical concerns(weakness)          Medical Problem List and Plan: 1.  Debility secondary to acute renal failure/ascites/multi-medical  Continue CIR 2.  Antithrombotics:   Right brachiocephalic DVT 0/04/6437: Eliquis             -antiplatelet therapy: N/A 3. Pain Management: Hydrocodone as needed 4. Mood: Provide emotional support             -antipsychotic agents: N/A 5. Neuropsych: This patient is capable of making decisions on his own behalf. 6. Skin/Wound Care/multiple decubiti sacrum/heels.:  Prevalon boots and follow-up wound care nurse routine skin checks 7. Fluids/Electrolytes/Nutrition: Routine in and outs.   8.  GI bleed.  EGD 01/21/2019.  Follow-up gastroenterology recommendations.    Hemoglobin 8.3 on 7/11, labs with HD today  Added simethicone for gas 9.    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  Status post left brachiocephalic AV fistula.  Hemodialysis Tuesday Thursday Saturday.    Hepatitis B neg  Appreciate nephro recs  Dietitian consult, plans for follow-up today 10.  Plasma cell myeloma.  Outpatient follow-up with oncology to discuss formal chemotherapy.  Appreciate oncology recommendations  Recs per oncology, no changes at present 11.  Right pleural effusion.  Status post thoracentesis 01/16/2019. 12.  Orthostasis.  ProAmatine 10 mg 3 times daily.    Labile on 7/14  Monitor with increased mobility 13.  Episodic SVT: Resolved  8 beats noted 02/08/2019, no repeat episodes since that time  Monitor with increased mobility 13.  History of CVA with recurrent right hemiparesis. 14.  Steroid-induced hyperglycemia  Slightly elevated on 7/11 15.  Peripheral edema Filed Weights   02/28/19 0451 03/01/19 0329 03/02/19 0617  Weight: 100.5 kg 98 kg 101.2 kg   Stable on 7/14  Echo reviewed 12/2018-normal EF  Improved with HD  Continue HD  LOS: 20 days A FACE TO FACE EVALUATION WAS  PERFORMED  Bonita Brindisi Lorie Phenix 03/02/2019, 10:38 AM

## 2019-03-02 NOTE — Plan of Care (Signed)
Nutrition Education Note  RD consulted for Renal Education. Provided various handouts (handouts regarding potassium phosphorus, and sodium) from [employer] to patient/family. Reviewed food groups and provided written recommended serving sizes specifically determined for patient's current nutritional status.  Explained why diet restrictions are needed and provided lists of foods to limit/avoid that are high potassium, sodium, and phosphorus. Provided specific recommendations on safer alternatives of these foods. Strongly encouraged compliance of this diet.  Also discussed pt's current medications including renal multivitamin and phosphate binder.  Discussed importance of protein intake at each meal and snack. Provided examples of how to maximize protein intake throughout the day. Discussed need for fluid restriction with dialysis, importance of minimizing weight gain between HD treatments, and renal-friendly beverage options.  Encouraged pt to discuss specific diet questions/concerns with RD at HD outpatient facility. Teach back method used.  Pt very appreciative of education and handouts, stating "this is exactly what I need."  Expect good compliance.  Body mass index is 28.63 kg/m. Pt meets criteria for overweight based on current BMI.  Current diet order is Renal with 1200 mL fluid restriction, patient is consuming approximately 75-100% of meals at this time. Labs and medications reviewed. No further nutrition interventions warranted at this time. RD contact information provided. If additional nutrition issues arise, please re-consult RD.   Gaynell Face, MS, RD, LDN Inpatient Clinical Dietitian Pager: [phone] Weekend/After Hours: [phone]

## 2019-03-02 NOTE — Progress Notes (Signed)
Occupational Therapy Session Note  Patient Details  Name: Scott Mcdowell MRN: [id number] Date of Birth: 06-05-1941  Today's Date: 03/02/2019 OT Individual Time: 4975-3005 OT Individual Time Calculation (min): 71 min    Short Term Goals: Week 3:  OT Short Term Goal 1 (Week 3): STG=LTG secondary to ELOS  Skilled Therapeutic Interventions/Progress Updates:    OT intervention with focus on bed mobility, sitting balance, sit<>stand, functional slide board transfers, bathing/dressing trasining, and safety awareness to increase independence with BADLs.   Supine>sit EOB with bed rails at supervision level. Sitting balance-supervision when using reacher to thread BLE into pants. Sit<>stand from EOB with RW-min A Pt required assistance pulling pants over hips. Slide board transfer to w/c -min A/CGA UB bathing-min A UB dressing -min A  Pt remained seated in w/c with belt alarm activated and all needs within reach.   Therapy Documentation Precautions:  Precautions Precautions: Fall Precaution Comments: R hemiparesis, h/o 1 fall in past 1 year; hemiparesis more apparent due to debility Restrictions Weight Bearing Restrictions: No Pain:  Pt denies pain   Therapy/Group: Individual Therapy  [name] 03/02/2019, 8:12 AM

## 2019-03-03 ENCOUNTER — Inpatient Hospital Stay (HOSPITAL_COMMUNITY): Payer: Medicare Other | Admitting: *Deleted

## 2019-03-03 ENCOUNTER — Encounter (HOSPITAL_COMMUNITY): Payer: Medicare Other

## 2019-03-03 ENCOUNTER — Ambulatory Visit (HOSPITAL_COMMUNITY): Payer: Medicare Other

## 2019-03-03 DIAGNOSIS — R739 Hyperglycemia, unspecified: Secondary | ICD-10-CM

## 2019-03-03 NOTE — Progress Notes (Signed)
Occupational Therapy Session Note  Patient Details  Name: [name] MRN: [id number] Date of Birth: [birth year]  Today's Date: 03/03/2019 OT Individual Time: 3244-0102 OT Individual Time Calculation (min): 73 min    Short Term Goals: Week 3:  OT Short Term Goal 1 (Week 3): STG=LTG secondary to ELOS  Skilled Therapeutic Interventions/Progress Updates:    Pt resting in bed upon arrival and ready for therapy.  OT intervention with focus on bed mobility, sit<>stand, standing tolerance/balance, dressing with sit<>stand from EOB, functional tranfsers, discharge planning, and activity tolerance.   Pt supine>sit EOB with bed flat and using bed rails-supervision UB dressing-supervision Pt able to thread BLE into pants and pull up to knees; required assistance for pulling over hips when standing Sit<>stand from EOB with RW-min A Stand pivot transfer with RW-min A  Pt remained in w/c with all needs within reach and belt alarm activated.  Therapy Documentation Precautions:  Precautions Precautions: Fall Precaution Comments: R hemiparesis, h/o 1 fall in past 1 year; hemiparesis more apparent due to debility Restrictions Weight Bearing Restrictions: No Pain:  Pt denies pain   Therapy/Group: Individual Therapy  [name] 03/03/2019, 8:14 AM

## 2019-03-03 NOTE — Progress Notes (Signed)
  [facility] Progress Note   Assessment/ Plan:   #Acute kidney injury now dialysis dependent and  progressed to ESRD.  He has TDC and AV fistula.  -He has outpatient spot for TTS at [facility] at 12:15. - Next planned HD tomorrow  - have been using 3K bath - LE edema--> improving with EDW adjustments, nearing euvolemia  # Anemia due to CKD: Received Feraheme on 6/15 and then 4 doses of Ferrlecit started on 6/27.  Continue ESA- Aranesp 200 q thursday   # Secondary hyperparathyroidism: Phosphorus level acceptable. PTH 40.  Continue Renvela.  # Hypotension/volume: Continue midodrine to 15 mg 3 times daily.    #Right upper extremity DVT: On Eliquis per primary team  #Plasma cell myeloma per oncology  #dispo: planned d/c 7/17  Subjective:    Last HD yesterday >no issues.  Today reports overall continued improvement in strength.  EDD still 7/17.   Objective:   BP (!) (P) 102/53 (BP Location: Left Leg)   Pulse (P) 83   Temp (P) 98 F (36.7 C) (Oral)   Resp 18   Ht 6\' 2"  (1.88 m)   Wt 99 kg   SpO2 (P) 96%   BMI 28.02 kg/m   Physical Exam: Gen: older gentleman, NAD, sitting in wheelchair CVS:RRR Resp: clear Abd: soft, no edema Ext: noLLE edema, significantly improved; RLE wrapped, unable to fully appreciate edema ACCESS: L UE AVF + T/B and R IJ TDC   Labs: BMET Recent Labs  Lab 02/27/19 1424 03/02/19 1334  NA 139 142  K 4.0 4.0  CL 100 101  CO2 28 28  GLUCOSE 105* 116*  BUN 51* 63*  CREATININE 4.65* 5.58*  CALCIUM 9.3 9.7  PHOS 2.6 2.9   CBC Recent Labs  Lab 02/27/19 1423 03/02/19 1334  WBC 6.3 5.6  HGB 8.3* 8.5*  HCT 26.8* 28.0*  MCV 105.5* 107.7*  PLT 212 217    @IMGRELPRIORS @ Medications:    . sodium chloride   Intravenous Once  . apixaban  5 mg Oral BID  . Chlorhexidine Gluconate Cloth  6 each Topical Q0600  . Chlorhexidine Gluconate Cloth  6 each Topical Q0600  . Chlorhexidine Gluconate Cloth  6 each Topical Q0600  .  darbepoetin (ARANESP) injection - DIALYSIS  200 mcg Intravenous Q Thu-HD  . feeding supplement (NEPRO CARB STEADY)  237 mL Oral TID BM  . liver oil-zinc oxide   Topical 5 X Daily  . midodrine  15 mg Oral TID WC  . multivitamin  1 tablet Oral QHS  . nystatin   Topical TID  . pantoprazole  40 mg Oral BID  . polyethylene glycol  17 g Oral Daily  . senna-docusate  1 tablet Oral QHS  . sevelamer carbonate  2,400 mg Oral TID WC  . simethicone  80 mg Oral TID    Jannifer Hick MD [facility] Pager [phone]

## 2019-03-03 NOTE — Plan of Care (Signed)
  Problem: Consults Goal: RH GENERAL PATIENT EDUCATION Description: See Patient Education module for education specifics. Outcome: Progressing Goal: Skin Care Protocol Initiated - if Braden Score 18 or less Description: If consults are not indicated, leave blank or document N/A Outcome: Progressing   Problem: RH BOWEL ELIMINATION Goal: RH STG MANAGE BOWEL WITH ASSISTANCE Description: STG Manage Bowel with Assistance. Min Outcome: Progressing Flowsheets (Taken 03/03/2019 1617) STG: Pt will manage bowels with assistance: 4-Minimum assistance Goal: RH STG MANAGE BOWEL W/MEDICATION W/ASSISTANCE Description: STG Manage Bowel with Medication with Assistance. Min Outcome: Progressing Flowsheets (Taken 03/03/2019 1617) STG: Pt will manage bowels with medication with assistance: 4-Minimal assistance   Problem: RH SKIN INTEGRITY Goal: RH STG SKIN FREE OF INFECTION/BREAKDOWN Description: Skin free of infection & further breakdown. Mod assistance. Outcome: Progressing Goal: RH STG MAINTAIN SKIN INTEGRITY WITH ASSISTANCE Description: STG Maintain Skin Integrity With Assistance. Mod assist. Outcome: Progressing Flowsheets (Taken 03/03/2019 1617) STG: Maintain skin integrity with assistance: 4-Minimal assistance   Problem: RH SAFETY Goal: RH STG ADHERE TO SAFETY PRECAUTIONS W/ASSISTANCE/DEVICE Description: STG Adhere to Safety Precautions With Assistance/Device. Mod assist. Outcome: Progressing Flowsheets (Taken 03/03/2019 1617) STG:Pt will adhere to safety precautions with assistance/device: 4-Minimal assistance   Problem: RH PAIN MANAGEMENT Goal: RH STG PAIN MANAGED AT OR BELOW PT'S PAIN GOAL Description: Pain level <3. Outcome: Progressing

## 2019-03-03 NOTE — Progress Notes (Signed)
Occupational Therapy Session Note  Patient Details  Name: Scott Mcdowell MRN: [id number] Date of Birth: Jan 06, 1941  Today's Date: 03/03/2019 OT Individual Time: 1300-1355 OT Individual Time Calculation (min): 55 min    Short Term Goals: Week 3:  OT Short Term Goal 1 (Week 3): STG=LTG secondary to ELOS  Skilled Therapeutic Interventions/Progress Updates:    Pt resting in bed upon arrival.  Family arrived after session commenced.  OT intervention with primary focus on family education, bed mobility, sit<>stand, stand pivot transfer with RW, and discharge planning. Time constraints didn't allow for hands-on education.  Family observed bed mobility (supine>sit EOB with supervision) and recommended bed rail for bed at home (informed family to check [place]). Sit<>stand with min A and stand pivot transfer with min A.  Educated family on purpose of Ace wrap for RLE and RUE.  Educated on wrapping with Ace wrap. Educated family on assist level and informed family that pt will need assist with toilet hygiene in addition to min for bathing and assist with pulling pants over hips.  Pt's daughter stated she would be able to assist her mother with providing assist.  Pt already owns [facility]. Pt will not be showering in the near future. Pt remained in w/c with all needs within reach and family present.   Therapy Documentation Precautions:  Precautions Precautions: Fall Precaution Comments: R hemiparesis, h/o 1 fall in past 1 year; hemiparesis more apparent due to debility Restrictions Weight Bearing Restrictions: No    Pain:  Pt denies pain   Therapy/Group: Individual Therapy  [name] 03/03/2019, 2:46 PM

## 2019-03-03 NOTE — Progress Notes (Signed)
Physical Therapy Session Note  Patient Details  Name: Scott Mcdowell MRN: [id number] Date of Birth: 01/08/1941  Today's Date: 03/03/2019 PT Individual Time: 0950-1015 PT Individual Time Calculation (min): 25 min   Short Term Goals: Week 3:  PT Short Term Goal 1 (Week 3): = LTGs due to LOS  Skilled Therapeutic Interventions/Progress Updates: Pt presented in w/c sleeping but easily aroused and agreeable to therapy. Pt denies pain throughout session. Pt propelled to rehab gym via hemi technique with x 3 rest breaks. Performed stand pivot transfer to mat with minA for STS and CGA for transfer. Participated in STS x5 for BLE strengthening with cues for hand placement and forward flexion for improving COG. Pt then performed stand pivot back to w/c with pt requiring increased time due to poor foot clearance. Pt transported back to room and remained in w/c with belt alarm in place, call bell within reach, and needs met.      Therapy Documentation Precautions:  Precautions Precautions: Fall Precaution Comments: R hemiparesis, h/o 1 fall in past 1 year; hemiparesis more apparent due to debility Restrictions Weight Bearing Restrictions: No General:   Vital Signs:  Pain: Pain Assessment Pain Scale: 0-10 Pain Score: 0-No pain   Therapy/Group: Individual Therapy  Constant [place]  Michaelah Credeur, PTA  03/03/2019, 12:21 PM

## 2019-03-03 NOTE — Progress Notes (Signed)
[place] PHYSICAL MEDICINE & REHABILITATION PROGRESS NOTE  Subjective/Complaints: Patient seen sitting up in his chair working with therapy this morning.  He states he slept well overnight.  Discussed shoulder subluxation with therapies.  He was seen by nephrology yesterday, notes reviewed.  ROS: Denies CP, SOB, N/V/D  Objective: Vital Signs: Blood pressure (!) (P) 102/53, pulse (P) 83, temperature (P) 98 F (36.7 C), temperature source (P) Oral, resp. rate 18, height 6\' 2"  (1.88 m), weight 99 kg, SpO2 (P) 96 %. No results found. Recent Labs    03/02/19 1334  WBC 5.6  HGB 8.5*  HCT 28.0*  PLT 217   Recent Labs    03/02/19 1334  NA 142  K 4.0  CL 101  CO2 28  GLUCOSE 116*  BUN 63*  CREATININE 5.58*  CALCIUM 9.7    Physical Exam: BP (!) (P) 102/53 (BP Location: Left Leg)   Pulse (P) 83   Temp (P) 98 F (36.7 C) (Oral)   Resp 18   Ht 6\' 2"  (1.88 m)   Wt 99 kg   SpO2 (P) 96%   BMI 28.02 kg/m  Constitutional: No distress . Vital signs reviewed. HENT: Normocephalic.  Atraumatic. Eyes: EOMI.  No discharge. Cardiovascular: No JVD. Respiratory: Normal effort. GI: Non-distended. Musc: No edema or tenderness in extremities. Musc: Generalized edema, improving Right shoulder subluxation Neurological: Alert Follows commands Motor:  Right upper extremity: 3-/5 shoulder flexion, elbow flexion/extension, handgrip 3+/5 Right lower extremity: Hip flexion 2+/5, knee extension 2+/5, ankle dorsiflexion 3+/5, stable Skin: Skin is warm and dry.  Coban dressing Sacral ulcers Psychiatric: pleasant and cooperative  Assessment/Plan: 1. Functional deficits secondary to debility which require 3+ hours per day of interdisciplinary therapy in a comprehensive inpatient rehab setting.  Physiatrist is providing close team supervision and 24 hour management of active medical problems listed below.  Physiatrist and rehab team continue to assess barriers to discharge/monitor patient  progress toward functional and medical goals  Care Tool:  Bathing    Body parts bathed by patient: Chest, Abdomen, Front perineal area, Right upper leg, Left upper leg   Body parts bathed by helper: Left arm, Buttocks, Right lower leg, Left lower leg Body parts n/a: Right arm   Bathing assist Assist Level: Moderate Assistance - Patient 50 - 74%     Upper Body Dressing/Undressing Upper body dressing   What is the patient wearing?: Pull over shirt    Upper body assist Assist Level: Supervision/Verbal cueing    Lower Body Dressing/Undressing Lower body dressing    Lower body dressing activity did not occur: Refused What is the patient wearing?: Pants, Incontinence brief     Lower body assist Assist for lower body dressing: Moderate Assistance - Patient 50 - 74%     Toileting Toileting    Toileting assist Assist for toileting: Total Assistance - Patient < 25%     Transfers Chair/bed transfer  Transfers assist     Chair/bed transfer assist level: Supervision/Verbal cueing     Locomotion Ambulation   Ambulation assist   Ambulation activity did not occur: Safety/medical concerns(weakness)  Assist level: Minimal Assistance - Patient > 75% Assistive device: Walker-rolling Max distance: 52   Walk 10 feet activity   Assist     Assist level: Minimal Assistance - Patient > 75% Assistive device: Walker-rolling   Walk 50 feet activity   Assist    Assist level: Minimal Assistance - Patient > 75% Assistive device: Walker-rolling    Walk 150 feet activity  Assist Walk 150 feet activity did not occur: Safety/medical concerns(weakness)         Walk 10 feet on uneven surface  activity   Assist           Wheelchair     Assist Will patient use wheelchair at discharge?: Yes Type of Wheelchair: Manual    Wheelchair assist level: Supervision/Verbal cueing Max wheelchair distance: 37'    Wheelchair 50 feet with 2 turns  activity    Assist    Wheelchair 50 feet with 2 turns activity did not occur: Safety/medical concerns(weakness)   Assist Level: Supervision/Verbal cueing   Wheelchair 150 feet activity     Assist Wheelchair 150 feet activity did not occur: Safety/medical concerns(weakness)          Medical Problem List and Plan: 1.  Debility secondary to acute renal failure/ascites/multi-medical  Continue CIR  Team conference today to discuss current and goals and coordination of care, home and environmental barriers, and discharge planning with nursing, case manager, and therapies.  2.  Antithrombotics:   Right brachiocephalic DVT 08/19/5724: Eliquis             -antiplatelet therapy: N/A 3. Pain Management: Hydrocodone as needed 4. Mood: Provide emotional support             -antipsychotic agents: N/A 5. Neuropsych: This patient is capable of making decisions on his own behalf. 6. Skin/Wound Care/multiple decubiti sacrum/heels.:  Prevalon boots and follow-up wound care nurse routine skin checks 7. Fluids/Electrolytes/Nutrition: Routine in and outs.   8. GI bleed.  EGD 01/21/2019.  Follow-up gastroenterology recommendations.    Hemoglobin 8.5 on 7/14  Added simethicone for gas 9. 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  Status post left brachiocephalic AV fistula.  Hemodialysis Tuesday Thursday Saturday.    Hepatitis B neg  Appreciate nephro recs  Dietitian consult, appreciate follow-up 10.  Plasma cell myeloma.  Outpatient follow-up with oncology to discuss formal chemotherapy.  Appreciate oncology recommendations  Recs per oncology, no changes at present 11.  Right pleural effusion.  Status post thoracentesis 01/16/2019. 12.  Orthostasis.  ProAmatine 15 mg 3 times daily.    Labile on 7/15  Monitor with increased mobility 13.  Episodic SVT: Resolved  8 beats noted 02/08/2019, no repeat episodes since that time  Monitor with increased mobility 13.  History of CVA with recurrent  right hemiparesis. 14.  Hyperglycemia  Slightly elevated on 7/14 15.  Peripheral edema Filed Weights   03/02/19 1315 03/02/19 1649 03/03/19 0500  Weight: 101 kg 100 kg 99 kg   Stable on 7/15  Echo reviewed 12/2018-normal EF  Improved with HD  Continue HD  LOS: 21 days A FACE TO FACE EVALUATION WAS PERFORMED  Ankit Lorie Phenix 03/03/2019, 9:45 AM

## 2019-03-03 NOTE — Patient Care Conference (Signed)
Inpatient RehabilitationTeam Conference and Plan of Care Update Date: 03/03/2019   Time: 2:20 PM    Patient Name: Scott Mcdowell      Medical Record Number: [id number]  Date of Birth: Aug 27, 1940 Sex: Male         Room/Bed: 4M12C/4M12C-01 Payor Info: Payor: [employer] / Plan: [employer] MEDICARE / Product Type: *No Product type* /    Admitting Diagnosis: 5. Gen Team  Debility, malaise, 11-18 days  Admit Date/Time:  02/10/2019  4:33 PM Admission Comments: No comment available   Primary Diagnosis:  <principal problem not specified> Principal Problem: <principal problem not specified>  Patient Active Problem List   Diagnosis Date Noted  . Hyperglycemia   . Hemodialysis-associated hypotension   . Peripheral edema   . Labile blood pressure   . Orthostatic hypotension   . Steroid-induced hyperglycemia   . History of supraventricular tachycardia   . ESRD on dialysis ([place])   . History of GI bleed   . Debility 02/10/2019  . AKI (acute kidney injury) ([place])   . CKD (chronic kidney disease), stage IV ([place])   . 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 Pleural effusion   . History of CVA with residual deficit   . SVT (supraventricular tachycardia) ([place])   . Orthostasis   . ESRD (end stage renal disease) ([place])   . Gastrointestinal hemorrhage   . Pressure injury of skin 01/30/2019  . Goals of care, counseling/discussion 01/29/2019  . 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 Shock ([place]) 01/21/2019  . Acute blood loss anemia 01/21/2019  . Upper GI bleed 01/21/2019  . B12 deficiency anemia 01/15/2019  . Pleural effusion on right 01/15/2019  . Ascites 01/15/2019  . CAP (community acquired pneumonia) 01/15/2019  . Elevated troponin 01/15/2019  . Hypercalcemia 01/14/2019  . ARF (acute renal failure) ([place]) 01/14/2019  . Hyperkalemia 01/14/2019    Expected Discharge Date: Expected Discharge Date: 03/05/19  Team Members Present: Physician leading  conference: Dr. Delice Lesch Social Worker Present: Ovidio Kin, LCSW Nurse Present: Blair Heys, RN PT Present: Georjean Mode, PT OT Present: Roanna Epley, [place], OT SLP Present: Weston Anna, SLP PPS Coordinator present : Gunnar Fusi, SLP     Current Status/Progress Goal Weekly Team Focus  Medical   Debility secondary to acute renal failure/ascites/multi-medical  Improve mobility, edema, orthostasis, AKI, plasma cell myeloma  See above   Bowel/Bladder   continent of urine (oliguric) occurs approx once daily, continent bowel LBM 03/02/2019  continue bowel program, maintain urine continence  Assess toileting needs q shift/prn   Swallow/Nutrition/ Hydration             ADL's   bathing bed level and seated in w/c-mod A; UB dressing-min A; LB dressing-max A; functional transfers-min A slide board; sit<>stand-min/mod A; toileting-max A  goals downgraded to mod A overall  education, transfers, BADL retraining, sit<>stand   Mobility             Communication             Safety/Cognition/ Behavioral Observations            Pain   no c/o pain  remain pain free, <3 0-10 pain scale  asses pain q shift/prn , medicate as necessary   Skin   stage 1 bilateral heel healing no s/s of infection, skin tear right buttocks, MASD improving to groin, buttocks and sacral areas, air mattress  maintain skin integrity prevent further breakdown  Assess  skin q shift and prn      *See Care Plan and progress notes for long and short-term goals.     Barriers to Discharge  Current Status/Progress Possible Resolutions Date Resolved   Physician    Medical stability;Hemodialysis     See above  Therapies, Nephro recs, follow labs, Onc recs      Nursing                  PT                    OT                  SLP                SW                Discharge Planning/Teaching Needs:  Daughter coming in for educaiton today, not sure realizes how much care pt is, but will see today.  Pt and family aware will need 24 hr care at DC      Team Discussion:  Making progress in therapies with PT-transfer's min assist and standing for a short time. Using slide board. Sacrum is healing stage one treating with cream and foam. Family training today with daughter and wife. Aware will need 24 hr care at discharge. Continent with bowels. HD T, TH and Sat has slot. Wrapping arm and leg helping with edema.  Revisions to Treatment Plan:  DC 7/17    Continued Need for Acute Rehabilitation Level of Care: The patient requires daily medical management by a physician with specialized training in physical medicine and rehabilitation for the following conditions: Daily direction of a multidisciplinary physical rehabilitation program to ensure safe treatment while eliciting the highest outcome that is of practical value to the patient.: Yes Daily medical management of patient stability for increased activity during participation in an intensive rehabilitation regime.: Yes Daily analysis of laboratory values and/or radiology reports with any subsequent need for medication adjustment of medical intervention for : Renal problems;Other;Wound care problems;Neurological problems;Blood pressure problems   I attest that I was present, lead the team conference, and concur with the assessment and plan of the team. Teleconference held due to COVID 19   Janasha Barkalow, [place] 03/05/2019, 10:47 AM

## 2019-03-03 NOTE — Progress Notes (Signed)
Physical Therapy Session Note  Patient Details  Name: Scott Mcdowell MRN: [id number] Date of Birth: Jan 03, 1941  Today's Date: 03/03/2019 PT Individual Time: 1435-1545 PT Individual Time Calculation (min): 70 min   Short Term Goals:  Week 3:  PT Short Term Goal 1 (Week 3): = LTGs due to LOS     Skilled Therapeutic Interventions/Progress Updates:   Pt seated in w/c.  Wife Collie Siad and Kris Hartmann here for family ed.  [name] brought in w/c from home, which is too narrow for pt. PT has emailed Josh, [employer] to bring a loaner w/c before d/c.  W/c propulsion using hemi method on level tile x 100' with supervision.  PT educated family on bed mobility, basic transfers, simulated car transfers, w/c mgt.  dtr [name] return- demonstrated basic transfers, bed mobility, simulated car transfer to 20" height (approx ht of Mary's car), and discussed edema mgt, activity tolerance, bed positioning.    Family questioned pt's edematous RUE,RLE and R trunk.  PT referred them to Nephrology MD when they take him to HD.   Slide board transfer to L/R slightly unlevel w/c> <mat with min assist.  Sit> supine with min assist for RLE; rolling L><R with supervision; R side lying> sitting with min assist for RLE.  Simulated car transfer from 22.5" high w/c >< 20" high car seat with mod assist to enter and exit car.  Pt exhausted due to afternoon session.  Mary transferred pt back to bed and assisted him into supine.   At end of session, pt resting in bed with alarm set and needs at hand.  Family present.  PT spoke with Jacqlyn Larsen, [place] who stated that pt's son is supposed to build ramp before d/c .  This has not been done to date.     Therapy Documentation Precautions:  Precautions Precautions: Fall Precaution Comments: R hemiparesis, h/o 1 fall in past 1 year; hemiparesis more apparent due to debility Restrictions Weight Bearing Restrictions: No  Pain: pt denies        Therapy/Group: Individual  Therapy  Livio Ledwith 03/03/2019, 5:03 PM

## 2019-03-04 ENCOUNTER — Inpatient Hospital Stay (HOSPITAL_COMMUNITY): Payer: Medicare Other

## 2019-03-04 LAB — RENAL FUNCTION PANEL
Albumin: 3.7 g/dL (ref 3.5–5.0)
Anion gap: 14 (ref 5–15)
BUN: 51 mg/dL — ABNORMAL HIGH (ref 8–23)
CO2: 26 mmol/L (ref 22–32)
Calcium: 9.7 mg/dL (ref 8.9–10.3)
Chloride: 99 mmol/L (ref 98–111)
Creatinine, Ser: 4.86 mg/dL — ABNORMAL HIGH (ref 0.61–1.24)
GFR calc Af Amer: 12 mL/min — ABNORMAL LOW (ref >60)
GFR calc non Af Amer: 10 mL/min — ABNORMAL LOW (ref >60)
Glucose, Bld: 107 mg/dL — ABNORMAL HIGH (ref 70–99)
Phosphorus: 2.7 mg/dL (ref 2.5–4.6)
Potassium: 4 mmol/L (ref 3.5–5.1)
Sodium: 139 mmol/L (ref 135–145)

## 2019-03-04 LAB — CBC
HCT: 28.6 % — ABNORMAL LOW (ref 39.0–52.0)
Hemoglobin: 8.6 g/dL — ABNORMAL LOW (ref 13.0–17.0)
MCH: 32.6 pg (ref 26.0–34.0)
MCHC: 30.1 g/dL (ref 30.0–36.0)
MCV: 108.3 fL — ABNORMAL HIGH (ref 80.0–100.0)
Platelets: 241 10*3/uL (ref 150–400)
RBC: 2.64 MIL/uL — ABNORMAL LOW (ref 4.22–5.81)
RDW: 18.8 % — ABNORMAL HIGH (ref 11.5–15.5)
WBC: 7.2 10*3/uL (ref 4.0–10.5)
nRBC: 0 % (ref 0.0–0.2)

## 2019-03-04 MED ORDER — MIDODRINE HCL 10 MG PO TABS: 10.0000 mg | ORAL_TABLET | Freq: Three times a day (TID) | ORAL | 1 refills | Status: DC

## 2019-03-04 MED ORDER — ATORVASTATIN CALCIUM 10 MG PO TABS: 10.0000 mg | ORAL_TABLET | Freq: Every day | ORAL | 0 refills | Status: DC

## 2019-03-04 MED ORDER — DARBEPOETIN ALFA 200 MCG/0.4ML IJ SOSY
PREFILLED_SYRINGE | INTRAMUSCULAR | Status: AC
  Filled 2019-03-04: qty 0.4

## 2019-03-04 MED ORDER — SEVELAMER CARBONATE 800 MG PO TABS: 2400.0000 mg | ORAL_TABLET | Freq: Three times a day (TID) | ORAL | 1 refills | Status: DC

## 2019-03-04 MED ORDER — HYDROCODONE-ACETAMINOPHEN 5-325 MG PO TABS: 1.0000 | ORAL_TABLET | ORAL | 0 refills | Status: DC | PRN

## 2019-03-04 MED ORDER — HEPARIN SODIUM (PORCINE) 1000 UNIT/ML IJ SOLN
INTRAMUSCULAR | Status: AC
  Administered 2019-03-04: 3400 [IU]
  Filled 2019-03-04: qty 4

## 2019-03-04 MED ORDER — PANTOPRAZOLE SODIUM 40 MG PO TBEC: 40.0000 mg | DELAYED_RELEASE_TABLET | Freq: Two times a day (BID) | ORAL | 1 refills | Status: AC

## 2019-03-04 MED ORDER — APIXABAN 5 MG PO TABS: 5.0000 mg | ORAL_TABLET | Freq: Two times a day (BID) | ORAL | 1 refills | Status: DC

## 2019-03-04 MED ORDER — HYDROCODONE-ACETAMINOPHEN 5-325 MG PO TABS
ORAL_TABLET | ORAL | Status: AC
  Filled 2019-03-04: qty 1

## 2019-03-04 MED ORDER — RENA-VITE PO TABS: 1.0000 | ORAL_TABLET | Freq: Every day | ORAL | 0 refills | Status: DC

## 2019-03-04 NOTE — Progress Notes (Signed)
Physical Therapy Discharge Summary  Patient Details  Name: Scott Mcdowell MRN: [id number] Date of Birth: 17-Dec-1940   Patient has met 7 of 8 long term goals due to improved activity tolerance, improved balance, improved postural control, increased strength, increased range of motion, decreased pain, ability to compensate for deficits, functional use of  right upper extremity and right lower extremity, improved awareness and improved coordination.  Patient to discharge at a wheelchair level [place].   Patient's care partner is independent to provide the necessary physical assistance at discharge.  Reasons goals not met: Patient did not meet furniture transfer goal due to increased fatigue he declined attempting the transfer. Patient stated that he will sit in the wheelchair at home in order to perform pressure relief.   Recommendation:  Patient will benefit from ongoing skilled PT services in home health setting to continue to advance safe functional mobility, address ongoing impairments in balance, R UE and LE strength, edema control, activity tolerance, functional mobility, and minimize fall risk.  Equipment: Temporary light wieght wheelchair with R ELR until his new wheelchair is delivered, and a wide RW with R hand splint  Reasons for discharge: treatment goals met  Patient/family agrees with progress made and goals achieved: Yes  PT Discharge Precautions/Restrictions Precautions Precautions: Fall Restrictions Weight Bearing Restrictions: No Vision/Perception  Perception Perception: Within Functional Limits Praxis Praxis: Intact  Cognition Overall Cognitive Status: No family/caregiver present to determine baseline cognitive functioning Orientation Level: Oriented X4 Attention: Focused;Sustained Focused Attention: Appears intact Sustained Attention: Appears intact Selective Attention: Appears intact Memory: Impaired Memory Impairment: Decreased short term  memory Decreased Short Term Memory: Verbal complex;Functional complex Awareness: Appears intact Problem Solving: Impaired Problem Solving Impairment: Verbal complex;Functional complex Safety/Judgment: Appears intact Sensation Sensation Light Touch: Appears Intact Coordination Gross Motor Movements are Fluid and Coordinated: No Fine Motor Movements are Fluid and Coordinated: No Coordination and Movement Description: R hemi Finger Nose Finger Test: unable RUE Heel Shin Test: unable RLE Motor  Motor Motor: Hemiplegia;Abnormal tone Motor - Skilled Clinical Observations: hypotonic on R side Motor - Discharge Observations: Increased tone on R side at d/c, continues to have weakness affecting functional mobility  Mobility Bed Mobility Bed Mobility: Rolling Right;Rolling Left;Supine to Sit;Sit to Supine Rolling Right: Independent with assistive device(with bed rail) Rolling Left: Independent with assistive device(with bed rail) Supine to Sit: Independent with assistive device(with bed rail) Sit to Supine: Minimal Assistance - Patient > 75% Transfers Transfers: Sit to Stand;Stand to Sit;Lateral/Scoot Transfers;Stand Pivot Transfers(Lateral scoot with slide board) Sit to Stand: Moderate Assistance - Patient 50-74% Stand to Sit: Moderate Assistance - Patient 50-74% Stand Pivot Transfers: Moderate Assistance - Patient 50 - 74% Stand Pivot Transfer Details: Verbal cues for safe use of DME/AE;Verbal cues for technique Stand Pivot Transfer Details (indicate cue type and reason): verbal cues for use of RW and hand splint and leaning far forward to stand. Lateral/Scoot Transfers: Supervision/Verbal Public house manager Ambulation: Yes Gait Assistance: Minimal Assistance - Patient > 75% Gait Distance (Feet): 70 Feet Assistive device: Rolling walker(with R hand splint) Gait Assistance Details: Verbal cues for gait pattern;Verbal cues for safe use  of DME/AE;Verbal cues for technique;Tactile cues for weight beaing Gait Assistance Details: Verbal cues for increased BOS, weight shift to R, quad activation on R in stance, and tactile cues for quad activation and blocking R knee in stance to prevent buckling. Gait Gait: Yes Gait Pattern: Decreased step length - left;Decreased stance time - right;Step-to pattern;Decreased  step length - right;Decreased hip/knee flexion - right;Decreased dorsiflexion - right;Decreased weight shift to right;Decreased trunk rotation;Poor foot clearance - right;Narrow base of support Gait velocity: decreased Stairs / Additional Locomotion Stairs: No Ramp: Contact Guard/touching assist(in w/c) Curb: 2 Helpers(in w/c) Wheelchair Mobility Wheelchair Mobility: Yes Wheelchair Assistance: Supervision/Verbal cueing Wheelchair Propulsion: Left upper extremity;Left lower extremity Wheelchair Parts Management: Needs assistance Distance: 100'  Trunk/Postural Assessment  Cervical Assessment Cervical Assessment: Within Functional Limits Thoracic Assessment Thoracic Assessment: Within Functional Limits Lumbar Assessment Lumbar Assessment: Exceptions to WFL(Posterior pelvic tilt) Postural Control Postural Control: Within Functional Limits  Balance Balance Balance Assessed: Yes Static Sitting Balance Static Sitting - Level of Assistance: 7: Independent Dynamic Sitting Balance Dynamic Sitting - Level of Assistance: 7: Independent Static Standing Balance Static Standing - Level of Assistance: 4: Min assist Dynamic Standing Balance Dynamic Standing - Level of Assistance: 4: Min assist Extremity Assessment      RLE Assessment RLE Assessment: Exceptions to [facility] Active Range of Motion (AROM) Comments: WFL for all functional mobility General Strength Comments: Grossly in sitting: hip flexion 2+/5, knee extension/flextion 3+/5, DF/PF 3+/5 LLE Assessment LLE Assessment: Within Functional Limits Active Range of Motion  (AROM) Comments: WFL for all functional mobility General Strength Comments: Grossly in sitting: 5/5 throughout    Taisa Deloria L Scott Mcdowell PT, DPT  03/04/2019, 12:57 PM

## 2019-03-04 NOTE — Plan of Care (Signed)
  Problem: Consults Goal: RH GENERAL PATIENT EDUCATION Description: See Patient Education module for education specifics. Outcome: Progressing Goal: Skin Care Protocol Initiated - if Braden Score 18 or less Description: If consults are not indicated, leave blank or document N/A Outcome: Progressing   Problem: RH BOWEL ELIMINATION Goal: RH STG MANAGE BOWEL WITH ASSISTANCE Description: STG Manage Bowel with Assistance. Min Outcome: Progressing Goal: RH STG MANAGE BOWEL W/MEDICATION W/ASSISTANCE Description: STG Manage Bowel with Medication with Assistance. Min Outcome: Progressing   Problem: RH SKIN INTEGRITY Goal: RH STG SKIN FREE OF INFECTION/BREAKDOWN Description: Skin free of infection & further breakdown. Mod assistance. Outcome: Progressing Goal: RH STG MAINTAIN SKIN INTEGRITY WITH ASSISTANCE Description: STG Maintain Skin Integrity With Assistance. Mod assist. Outcome: Progressing   Problem: RH SAFETY Goal: RH STG ADHERE TO SAFETY PRECAUTIONS W/ASSISTANCE/DEVICE Description: STG Adhere to Safety Precautions With Assistance/Device. Mod assist. Outcome: Progressing   Problem: RH PAIN MANAGEMENT Goal: RH STG PAIN MANAGED AT OR BELOW PT'S PAIN GOAL Description: Pain level <3. Outcome: Progressing

## 2019-03-04 NOTE — Progress Notes (Signed)
Occupational Therapy Session Note  Patient Details  Name: Scott Mcdowell MRN: [id number] Date of Birth: 02/22/1941  Today's Date: 03/04/2019 OT Individual Time: 7034-0352 OT Individual Time Calculation (min): 71 min    Short Term Goals: Week 1:  OT Short Term Goal 1 (Week 1): Pt will complete bathing with mod assist at sit > stand level OT Short Term Goal 1 - Progress (Week 1): Progressing toward goal OT Short Term Goal 2 (Week 1): Pt will complete LB dressing at max assist level OT Short Term Goal 2 - Progress (Week 1): Met OT Short Term Goal 3 (Week 1): Pt will complete toilet transfer max assist stand pivot OT Short Term Goal 3 - Progress (Week 1): Progressing toward goal OT Short Term Goal 4 (Week 1): Pt will complete 1 grooming task in standing for increased standing tolerance  Skilled Therapeutic Interventions/Progress Updates:    1:1. Pt received in bed with no pain reported. Pt asking to toilet prior to bathing an dressing. Pt declines squat or stand pivot transfers requesting to use stedy despite encouragement top practice as it would be at home. Pt requires A for thorough hygiene and min A for CM in standing in stedy. Pt completes bathing at sink with A to wash LUE. Pt dons shirt with min A to thread RUE into shirt and VC for hemi technique. Pt threads BLE into pants with reacher and VC for technqiue and advances past hips with MIN A at sit to stand level. OT dons teds, ace and non skid slipper socks.Pt completes stand pivot transfers 2x w/c to [facility] for blocked practice with VC for hand placement prior to sit to stand. Exited session with pt seated in w/c, call light in reach and belt alarm on  Therapy Documentation Precautions:  Precautions Precautions: Fall Precaution Comments: R hemiparesis, h/o 1 fall in past 1 year; hemiparesis more apparent due to debility Restrictions Weight Bearing Restrictions: No General:   Vital Signs: Therapy Vitals Temp: (!) 97.5 F (36.4  C) Temp Source: Oral Pulse Rate: 85 Resp: 20 BP: (!) 104/48 Patient Position (if appropriate): Lying Oxygen Therapy SpO2: 96 % O2 Device: Room Air Pain:   ADL: ADL Grooming: Minimal assistance Where Assessed-Grooming: Edge of bed Upper Body Bathing: Minimal assistance Where Assessed-Upper Body Bathing: Edge of bed Lower Body Bathing: Maximal assistance Where Assessed-Lower Body Bathing: Edge of bed Upper Body Dressing: Moderate assistance Where Assessed-Upper Body Dressing: Edge of bed Lower Body Dressing: Dependent(+2) Where Assessed-Lower Body Dressing: Edge of bed Toilet Transfer: Maximal assistance(+2) Toilet Transfer Method: Charlaine Dalton) Toilet Transfer Equipment: Art gallery manager    Praxis   Exercises:   Other Treatments:     Therapy/Group: Individual Therapy  [name] 03/04/2019, 7:12 AM

## 2019-03-04 NOTE — Progress Notes (Addendum)
  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  He has TDC and AV fistula.  -He has outpatient spot for TTS at [facility]. - Next planned HD today - have been using 3K bath - LE edema--> improving with EDW adjustments, nearing euvolemia. Set outpt EDW to post wt today.  # Anemia due to CKD: Received Feraheme on 6/15 and then 4 doses of Ferrlecit started on 6/27.  Continue ESA- Aranesp 200 q thursday   # Secondary hyperparathyroidism: Phosphorus level acceptable. PTH 40.  Continue Renvela.  # Hypotension/volume: Continue midodrine to 15 mg 3 times daily.    #Right upper extremity DVT: On Eliquis per primary team  #Plasma cell myeloma per oncology  #dispo: planned d/c 7/17.  Will not plan to see tomorrow, day of discharge.  Please page [place] with any issues we can help with.   Subjective:    For HD today. Plan D/C tomorrow.  Disappointed moved to 1st shift TTS for [place].    Objective:   BP (!) 104/48 (BP Location: Left Leg)   Pulse 85   Temp (!) 97.5 F (36.4 C) (Oral)   Resp 20   Ht 6\' 2"  (1.88 m)   Wt 99.1 kg   SpO2 96%   BMI 28.05 kg/m   Physical Exam: Gen: older gentleman, NAD, sitting in wheelchair CVS:RRR Resp: clear Abd: soft, no edema Ext: noLLE edema, significantly improved; RLE wrapped, unable to fully appreciate edema ACCESS: L UE AVF + T/B and R IJ TDC   Labs: BMET Recent Labs  Lab 02/27/19 1424 03/02/19 1334  NA 139 142  K 4.0 4.0  CL 100 101  CO2 28 28  GLUCOSE 105* 116*  BUN 51* 63*  CREATININE 4.65* 5.58*  CALCIUM 9.3 9.7  PHOS 2.6 2.9   CBC Recent Labs  Lab 02/27/19 1423 03/02/19 1334  WBC 6.3 5.6  HGB 8.3* 8.5*  HCT 26.8* 28.0*  MCV 105.5* 107.7*  PLT 212 217    @IMGRELPRIORS @ Medications:    . sodium chloride   Intravenous Once  . apixaban  5 mg Oral BID  . Chlorhexidine Gluconate Cloth  6 each Topical Q0600  . Chlorhexidine  Gluconate Cloth  6 each Topical Q0600  . Chlorhexidine Gluconate Cloth  6 each Topical Q0600  . darbepoetin (ARANESP) injection - DIALYSIS  200 mcg Intravenous Q Thu-HD  . feeding supplement (NEPRO CARB STEADY)  237 mL Oral TID BM  . liver oil-zinc oxide   Topical 5 X Daily  . midodrine  15 mg Oral TID WC  . multivitamin  1 tablet Oral QHS  . nystatin   Topical TID  . pantoprazole  40 mg Oral BID  . polyethylene glycol  17 g Oral Daily  . senna-docusate  1 tablet Oral QHS  . sevelamer carbonate  2,400 mg Oral TID WC  . simethicone  80 mg Oral TID    Jannifer Hick MD [facility] Pager [phone]

## 2019-03-04 NOTE — Progress Notes (Addendum)
Physical Therapy Session Note  Patient Details  Name: Scott Mcdowell MRN: [id number] Date of Birth: [birth year]  Today's Date: 03/04/2019 PT Individual Time: 8828-0034 and 1105-1205 PT Individual Time Calculation (min): 60 min and 60 min   Short Term Goals: Week 3:  PT Short Term Goal 1 (Week 3): = LTGs due to LOS  Skilled Therapeutic Interventions/Progress Updates:     Session 1: Patient in w/c upon PT arrival. Patient alert and agreeable to PT session.  Therapeutic Activity: Transfers: Patient performed attempted sit to/from stand x2 with max A using the RW, however was unable to come to standing, reported he had a strenuous session yesterday and this morning. PT educated on effects of fatigue during recovery, as patient stated he was frustrated that he was unable to stand. He was agreeable to trying again in a later session. He then performed a simulated car transfer using the slide board with min A and set up assist with the car set to 19.5" to simulate a 4 door sedan seat height. Provided verbal cues for leaning forward to stand and and head hips relationship during SBT.  Wheelchair Mobility:  Patient propelled wheelchair 85 feet with supervision. Provided verbal cues for increased use of UE x1. He propelled the w/c up down a ramp x2, up backwards and down forwards, with CGA-min A for safety and initiation using his L UE and LE with a hemi technique.  Spoke to patient's wife on the phone about the status of a ramp at home, she stated that her son was planning to put one in, but that she had not heard back from him. She said that her daughter and son have bumped him up their steps in a w/c before and she does not have concerns with them being able to do this at d/c and that they will put in a ramp as soon as possible. PT discussed concerns about the patient requiring a larger w/c at d/c than he had previously and trips to dialysis 3x per week and she was agreeable to having her  daughter come with her at d/c to practice bumping up a step with PT prior to d/c and to have medical transport home for the patient if family was not safe or successful during practice. PT asked his wife to also measure the doorways in the home to determine w/c accessibility, and she stated she would call back during his later session with those measurements.   Session 2: Patient in w/c upon PT arrival. Patient alert and agreeable to PT session. Spoke with patients wife on the phone at beginning of session. She stated the enterence doorway width is 36" and that his bed will be in the living room, so he will not have to go through other doorways. When PT asked about set up for the ramp, the patient stated that there is not room for a ramp that is up to standard regulation for the slope because there are trees in the way at both entrances, the patient's wife confirmed this and the patient and his wife insisted that they would have their children bump him up and down the steps and that they would put in a ramp only if he is unable to go up and down steps following all of his therapy. PT educated on potential for increased fatigue following dialysis and concerns about inconsistent success with steps following, however the patient and his wife continued to be firm in their decision to not put in a ramp  at this time. PT confirmed plan for education on bumping w/c up the steps with family tomorrow.   Therapeutic Activity: Bed Mobility: Patient performed sit to supine with min A for R LE management x2 and supine to sit with supervision using a bed rail x1. He reports that he will have a bed rail ordered from [employer] at d/c.  Transfers: Patient performed sit to/from stand x1 with min A and a stand pivot transfer x2 with min A on the first trial and 2 attempts for the second trial with the mat table at the lowest height for the first attempt, when unable to stand from this height, PT raised the table to w/c seat height  and he required mod A to stand due to fatigue. Provided verbal cues for leaning far forward and foot and hand positioning prior to transfers. He performed a slide board transfer from the w/c to the bed with supervision for safety with increased time due to fatigue. When asked to perform a furniture transfer, he refused due to fatigue and stated he will not be sitting on the furniture at home, but his his w/c.   Gait Training:  Patient ambulated 70 feet using a bariatric RW and R hand splint with min A with B tennis shoes donned with R sole removed and shoe cover for improved fit and R foot advancement and L heel lift.  Ambulated with step-to gait pattern leading with R, decreased initiation of R LE with fatigue, however remained able to advance without assist, and increased knee flexion in stance on R, without buckling. Provided verbal cues for R foot advancement, striking with his heel at initial contact with toes forward, and quad activation in stance of R.  Patient in bed at end of session with breaks locked, bed alarm set, and all needs within reach.     Therapy Documentation Precautions:  Precautions Precautions: Fall Precaution Comments: R hemiparesis, h/o 1 fall in past 1 year; hemiparesis more apparent due to debility Restrictions Weight Bearing Restrictions: No  Pain: Pain Assessment Pain Scale: 0-10 Pain Score: 0-No pain    Therapy/Group: Individual Therapy  Yael Angerer L Lemma Tetro PT, DPT  03/04/2019, 5:21 PM

## 2019-03-04 NOTE — Discharge Summary (Addendum)
Physician Discharge Summary  Patient ID: Scott Mcdowell MRN: [id number] DOB/AGE: 10-03-1940 78 y.o.  Admit date: 02/10/2019 Discharge date: 03/05/2019  Discharge Diagnoses:  Active Problems:   Debility   ESRD on dialysis [facility])   History of GI bleed   Steroid-induced hyperglycemia   History of supraventricular tachycardia   Orthostatic hypotension   Labile blood pressure   Hemodialysis-associated hypotension   Peripheral edema   Hyperglycemia Right brachiocephalic DVT Plasma cell myeloma  Discharged Condition: Stable  Significant Diagnostic Studies: Dg Chest Port 1 View  Result Date: 02/05/2019 CLINICAL DATA:  Dialysis. EXAM: PORTABLE CHEST 1 VIEW COMPARISON:  02/04/2019. FINDINGS: Dialysis catheter noted with tip projected over the right atrium. Cardiomegaly with pulmonary venous congestion and bilateral interstitial prominence. Right-sided pleural effusion. Findings consistent with CHF. Pneumonitis cannot be excluded. Lytic lesion noted the right posterior fifth rib and possibly the right posterolateral seventh rib. A process such as myeloma or metastatic disease could present in this fashion. IMPRESSION: 1.  Dialysis catheter noted with tip projected over right atrium. 2. Cardiomegaly with pulmonary venous congestion bilateral interstitial prominence and right-sided pleural effusion. Findings most consistent with CHF. 3. Lytic lesion noted in the right posterior fifth rib and possibly the right posterolateral seventh rib. A process such as myeloma or metastatic disease could present in this fashion. Electronically Signed   By: Marcello Moores  Register   On: 02/05/2019 12:33   Dg Chest Port 1 View  Result Date: 02/04/2019 CLINICAL DATA:  Post dialysis EXAM: PORTABLE CHEST 1 VIEW COMPARISON:  01/26/2019 FINDINGS: Heart is enlarged. Right jugular central venous catheter tip proximal right atrium unchanged. Left jugular catheter tip mid SVC unchanged Pulmonary vascular congestion and mild  edema shows mild interval improvement. Bibasilar atelectasis right greater than left unchanged. Small right pleural effusion unchanged. IMPRESSION: Mild interval improvement in bilateral airspace disease most likely edema. Bibasilar atelectasis right greater than left and small right effusion unchanged. Electronically Signed   By: Franchot Gallo M.D.   On: 02/04/2019 15:43   Dg Fluoro Guide Cv Line-no Report  Result Date: 02/05/2019 Fluoroscopy was utilized by the requesting physician.  No radiographic interpretation.    Labs:  Basic Metabolic Panel: Recent Labs  Lab 02/27/19 1424 03/02/19 1334 03/04/19 1425  NA 139 142 139  K 4.0 4.0 4.0  CL 100 101 99  CO2 _0 GLUCOSE 105* 116* 107*  BUN 51* 63* 51*  CREATININE 4.65* 5.58* 4.86*  CALCIUM 9.3 9.7 9.7  PHOS 2.6 2.9 2.7    CBC: Recent Labs  Lab 02/27/19 1423 03/02/19 1334 03/04/19 1425  WBC 6.3 5.6 7.2  HGB 8.3* 8.5* 8.6*  HCT 26.8* 28.0* 28.6*  MCV 105.5* 107.7* 108.3*  PLT 212 217 241    CBG: No results for input(s): GLUCAP in the last 168 hours.  Family history.  Mother with cancer.  Father with Parkinson's disease.  Denies any diabetes mellitus or colon cancer  Brief HPI:   Scott Mcdowell is a 78 year old right-handed male with history of hypertension, CVA with residual right side weakness maintained on aspirin.  Per chart review lives with spouse.  Independent with assistive device prior to admission.  Presented to [facility]/15/2019 with shortness of breath and scrotal edema.  Labs were obtained.  Sodium 137, potassium 5.6, BUN 80, creatinine 2.5.  Troponin  0.07, COVID negative.  CT chest abdomen pelvis showed moderate right pleural effusion associated with atelectasis versus consolidation.  There was noted infrarenal abdominal aortic aneurysm measuring  3.2 x 3.1 cm.  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  The right common iliac measuring 2.3 cm in caliber left common iliac measuring  2.3.  Testicular ultrasound no evidence of testicular mass.  Patient was transferred to [facility] for further evaluation.  Noted pleural effusion underwent thoracentesis 01/16/2019 with drainage of approximately 600 cc.  He did require intubation through 01/22/2019 maintained on broad-spectrum antibiotics.  Hospital course complicated by acute kidney injury with follow-up renal services circulatory shock requiring vasopressors.  On 01/19/2019 he was found to have right brachial vein DVT started on heparin.  He subsequently developed GI bleed underwent EGD on 01/21/2019 which showed adherent clot friable mucosa distal gastric body area was clipped per Dr. Havery Moros.  His anticoagulation was initially discontinued due to high risk of bleeding.  To date patient had received 6 units packed red blood cells, 1 unit of platelet and 3 units of fresh frozen plasma.  His hemoglobin stabilized he was transitioned to Eliquis for DVT.  Patient found to have a lytic right lesion concerning for possible malignancy.  A multiple myeloma panel and bone marrow panel were performed with follow-up oncology service Dr. Marin Olp.  On 01/26/2019 he underwent bone marrow biopsy which showed hypercellular marrow involved by plasma cell neoplasm featuring consistent with plasma cell myeloma.  Plan was to follow-up outpatient oncology services.  Patient's creatinine 6.50-4.61 hemodialysis initiated plan long-term dialysis.  Patient with right IJ tunneled catheter and left brachiocephalic AV fistula had been placed.  Patient with episode of SVT 02/08/2019 initially on metoprolol discontinued started on amiodarone and adjusted.  Patient with multiple decubiti sacrum and heels Prevalon boots in place frequent turning to maintain skin.  Patient was admitted for a comprehensive rehab program  Hospital Course: [name] was admitted to rehab 02/10/2019 for inpatient therapies to consist of PT, ST and OT at least three hours five days a  week. Past admission physiatrist, therapy team and rehab RN have worked together to provide customized collaborative inpatient rehab.  Pertaining to patient's debility related to multiple medical issues he was attending therapies.  He remained on Eliquis for right brachiocephalic DVT no bleeding episodes.  In regards to recent GI bleed he had undergone EGD 2020 per gastroenterology services he would follow-up as outpatient.  Hemodialysis ongoing long-term follow-up per renal services.  Findings of plasma cell myeloma outpatient follow-up with oncology services have been arranged.  During patient's hospital course he had undergone right thoracentesis for pleural effusion 01/16/2019.  Oxygen saturations greater than 90%.  Orthostatic hypotension monitor closely patient remained on ProAmatine.  Physical exam.  Blood pressure 116/47, pulse 86, temperature 98.6, respirations 20, oxygen saturations 95% room air HEENT Head.  Normocephalic and atraumatic Eyes.  EOMs normal no discharge without nystagmus Respiratory effort normal no respiratory distress GI.  Exhibits no distention nontender without rebound Musculoskeletal.  Generalized edema Neurological.  Alert mood was flat but appropriate follow commands left upper extremity 5 out of 5 proximal distal left lower extremity 4- 4+ out of 5 proximal to distal right upper extremity 3- out of 5 proximal distal right lower extremity 2+ out of 5 proximal to distal  Rehab course: During patient's stay in rehab weekly team conferences were held to monitor patient's progress, set goals and discuss barriers to discharge. At admission, patient required max assist sit to stand, +2 physical assist stand pivot transfers, minimal assist ambulate 5 feet rolling walker.  Minimal assist upper body bathing total assist lower body bathing  moderate assist upper body dressing total assist lower body dressing  He  has had improvement in activity tolerance, balance, postural control as  well as ability to compensate for deficits. He has had improvement in functional use RUE/LUE  and RLE/LLE as well as improvement in awareness.  Family teaching underway with wife and daughter.  Wheelchair propulsion using hemi-method supervision.  Therapy team educated family on bed mobility, basic transfers, simulated car transfers, wheelchair management.  Daughter demonstrated basic transfers bed mobility for her father.  Sliding board transfers with minimal assistance sit to supine with minimal assistance simulated car transfers moderate assist to enter and exit car.  Bed mobility sit to stand stand pivot transfers and rolling walker discharge planning with Occupational Therapy.  Sit to stand with minimal assistance.  Full family teaching was completed plan discharge to home       Disposition: Discharge disposition: 01-Home or Self Care     Discharge to home   Diet: Renal diet  Special Instructions: No smoking driving or alcohol  Outpatient hemodialysis arranged Tuesday Thursday Saturday at [place]  Medications at discharge. 1.  Eliquis 5 mg twice daily 2.  Hydrocodone 1 to 2 tablets every 4 hours as needed pain 3.  ProAmatine 15 mg p.o. 3 times daily 4.  Rena-Vite 1 tablet nightly 5.  Protonix 40 mg p.o. twice daily 6.  MiraLAX daily hold for loose stools 7.  Senokot S1 tablet nightly 8.  Renvela 2400 mg 3 times daily 9.  Mylicon 80 mg 3 times daily Discharge Instructions    Ambulatory referral to Physical Medicine Rehab   Complete by: As directed    Moderate complexity follow up 1-2 weeks debility/multi medical    10.Lipitor 10 mg daily  Follow-up Information    Jamse Arn, MD Follow up.   Specialty: Physical Medicine and Rehabilitation Why: Office to call for appointment Contact information: [address] [place] [address] [phone]        Roney Jaffe, MD Follow up.   Specialty: Nephrology Why: Call for appointment Contact  information: [place] [address] [phone]        Volanda Napoleon, MD Follow up.   Specialty: Oncology Why: Call for appointment Contact information: [place] [address] [phone]        Yetta Flock, MD Follow up.   Specialty: Gastroenterology Why: Call for appointment Contact information: [place] [address] [phone]           Signed: ANDRIK SANDT 03/05/2019, 5:41 AM Patient seen and examined by me on day of discharge. Delice Lesch, MD, ABPMR

## 2019-03-04 NOTE — Progress Notes (Signed)
Pt returned to unit via bed from HD. Pt moaning d/t pain in RLE. Ace wrap and TED hose removed from LE. Pt stated "feels much better". Resting in bed with call bell in reach. Will cont to monitor.   Erie Noe, RN

## 2019-03-04 NOTE — Progress Notes (Signed)
[place] PHYSICAL MEDICINE & REHABILITATION PROGRESS NOTE  Subjective/Complaints: Patient seen sitting up in his chair this morning.  He states he slept well overnight.  He states he is looking forward to discharge tomorrow.  He notes improvement in strength in the a.m.  ROS: Denies CP, SOB, N/V/D  Objective: Vital Signs: Blood pressure (!) 104/48, pulse 85, temperature (!) 97.5 F (36.4 C), temperature source Oral, resp. rate 20, height 6\' 2"  (1.88 m), weight 99.1 kg, SpO2 96 %. No results found. Recent Labs    03/02/19 1334  WBC 5.6  HGB 8.5*  HCT 28.0*  PLT 217   Recent Labs    03/02/19 1334  NA 142  K 4.0  CL 101  CO2 28  GLUCOSE 116*  BUN 63*  CREATININE 5.58*  CALCIUM 9.7    Physical Exam: BP (!) 104/48 (BP Location: Left Leg)   Pulse 85   Temp (!) 97.5 F (36.4 C) (Oral)   Resp 20   Ht 6\' 2"  (1.88 m)   Wt 99.1 kg   SpO2 96%   BMI 28.05 kg/m  Constitutional: No distress . Vital signs reviewed. HENT: Normocephalic.  Atraumatic. Eyes: EOMI.  No discharge. Cardiovascular: No JVD. Respiratory: Normal effort. GI: Non-distended. Musc: No edema or tenderness in extremities. Musc: Generalized edema, unchanged Right shoulder subluxation Neurological: Alert Follows commands Motor:  Right upper extremity: 3/5 shoulder flexion, elbow flexion/extension, handgrip 3+/5, better in the morning Right lower extremity: Hip flexion 3-3+/5, knee extension 3-3+, ankle dorsiflexion 3+/5, better in the morning Skin: Skin is warm and dry.  Coban dressing Sacral ulcers Psychiatric: pleasant and cooperative  Assessment/Plan: 1. Functional deficits secondary to debility which require 3+ hours per day of interdisciplinary therapy in a comprehensive inpatient rehab setting.  Physiatrist is providing close team supervision and 24 hour management of active medical problems listed below.  Physiatrist and rehab team continue to assess barriers to discharge/monitor patient  progress toward functional and medical goals  Care Tool:  Bathing    Body parts bathed by patient: Chest, Abdomen, Front perineal area, Right upper leg, Left upper leg, Right arm, Face   Body parts bathed by helper: Left arm, Buttocks, Right lower leg, Left lower leg Body parts n/a: Right arm   Bathing assist Assist Level: Moderate Assistance - Patient 50 - 74%     Upper Body Dressing/Undressing Upper body dressing   What is the patient wearing?: Pull over shirt    Upper body assist Assist Level: Minimal Assistance - Patient > 75%    Lower Body Dressing/Undressing Lower body dressing    Lower body dressing activity did not occur: Refused What is the patient wearing?: Pants     Lower body assist Assist for lower body dressing: Minimal Assistance - Patient > 75%     Toileting Toileting    Toileting assist Assist for toileting: Moderate Assistance - Patient 50 - 74%     Transfers Chair/bed transfer  Transfers assist     Chair/bed transfer assist level: Minimal Assistance - Patient > 75%     Locomotion Ambulation   Ambulation assist   Ambulation activity did not occur: Safety/medical concerns(weakness)  Assist level: Minimal Assistance - Patient > 75% Assistive device: Walker-rolling Max distance: 52   Walk 10 feet activity   Assist     Assist level: Minimal Assistance - Patient > 75% Assistive device: Walker-rolling   Walk 50 feet activity   Assist    Assist level: Minimal Assistance - Patient > 75%  Assistive device: Walker-rolling    Walk 150 feet activity   Assist Walk 150 feet activity did not occur: Safety/medical concerns(weakness)         Walk 10 feet on uneven surface  activity   Assist           Wheelchair     Assist Will patient use wheelchair at discharge?: Yes Type of Wheelchair: Manual    Wheelchair assist level: Supervision/Verbal cueing Max wheelchair distance: 100    Wheelchair 50 feet with 2 turns  activity    Assist    Wheelchair 50 feet with 2 turns activity did not occur: Safety/medical concerns(weakness)   Assist Level: Supervision/Verbal cueing   Wheelchair 150 feet activity     Assist Wheelchair 150 feet activity did not occur: Safety/medical concerns(weakness)          Medical Problem List and Plan: 1.  Debility secondary to acute renal failure/ascites/multi-medical  Continue CIR  Plan for d/c tomorrow  Will see patient for transitional care management in 1-2 weeks post-discharge 2.  Antithrombotics:   Right brachiocephalic DVT 04/20/1193: Eliquis             -antiplatelet therapy: N/A 3. Pain Management: Hydrocodone as needed 4. Mood: Provide emotional support             -antipsychotic agents: N/A 5. Neuropsych: This patient is capable of making decisions on his own behalf. 6. Skin/Wound Care/multiple decubiti sacrum/heels.:  Prevalon boots and follow-up wound care nurse routine skin checks 7. Fluids/Electrolytes/Nutrition: Routine in and outs.   8. GI bleed.  EGD 01/21/2019.  Follow-up gastroenterology recommendations.    Hemoglobin 8.5 on 7/14  Added simethicone for gas 9. 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  Status post left brachiocephalic AV fistula.  Hemodialysis Tuesday Thursday Saturday.    Hepatitis B neg  Appreciate nephro recs  Dietitian consult, appreciate follow-up 10.  Plasma cell myeloma.  Outpatient follow-up with oncology to discuss formal chemotherapy.  Appreciate oncology recommendations  Recs per oncology, no changes at present 11.  Right pleural effusion.  Status post thoracentesis 01/16/2019. 12.  Orthostasis.  ProAmatine 15 mg 3 times daily.    Relatively controlled on 7/16  Monitor with increased mobility 13.  Episodic SVT: Resolved  8 beats noted 02/08/2019, no repeat episodes since that time  Monitor with increased mobility 13.  History of CVA with recurrent right hemiparesis. 14.  Hyperglycemia  Slightly elevated on  7/14 15.  Peripheral edema Filed Weights   03/02/19 1649 03/03/19 0500 03/04/19 0500  Weight: 100 kg 99 kg 99.1 kg   Stable on 7/16  Echo reviewed 12/2018-normal EF  Improved with HD  Continue HD  LOS: 22 days A FACE TO FACE EVALUATION WAS PERFORMED   Lorie Phenix 03/04/2019, 10:43 AM

## 2019-03-05 ENCOUNTER — Inpatient Hospital Stay (HOSPITAL_COMMUNITY): Payer: Medicare Other

## 2019-03-05 NOTE — Progress Notes (Signed)
Social Work Patient ID: Scott Mcdowell, male   DOB: 1940-09-07, 78 y.o.   MRN: [id number]  Met with pt, wife and daughter when here for education made aware several times of pt's need for a ramp especially with him going back and forth to HD. [facility] they have bumped him up and down the stairs before after his CVA. They seem to believe they can do his care and will manage fine, since pt has been in worse shape and required more care in the past. Aware of his HD OP schedule and the fact it changed to 6:45-T, TH Sat. Home health to follow to provide more therapies at home. Feels ready for DC today, aware needs to go to HD center in [place] to sign forms today

## 2019-03-05 NOTE — Plan of Care (Signed)
  Problem: Consults Goal: RH GENERAL PATIENT EDUCATION Description: See Patient Education module for education specifics. Outcome: Completed/Met Goal: Skin Care Protocol Initiated - if Braden Score 18 or less Description: If consults are not indicated, leave blank or document N/A Outcome: Completed/Met   Problem: RH BOWEL ELIMINATION Goal: RH STG MANAGE BOWEL WITH ASSISTANCE Description: STG Manage Bowel with Assistance. Min Outcome: Completed/Met Goal: RH STG MANAGE BOWEL W/MEDICATION W/ASSISTANCE Description: STG Manage Bowel with Medication with Assistance. Min Outcome: Completed/Met   Problem: RH SKIN INTEGRITY Goal: RH STG SKIN FREE OF INFECTION/BREAKDOWN Description: Skin free of infection & further breakdown. Mod assistance. Outcome: Completed/Met Goal: RH STG MAINTAIN SKIN INTEGRITY WITH ASSISTANCE Description: STG Maintain Skin Integrity With Assistance. Mod assist. Outcome: Completed/Met   Problem: RH SAFETY Goal: RH STG ADHERE TO SAFETY PRECAUTIONS W/ASSISTANCE/DEVICE Description: STG Adhere to Safety Precautions With Assistance/Device. Mod assist. Outcome: Completed/Met   Problem: RH PAIN MANAGEMENT Goal: RH STG PAIN MANAGED AT OR BELOW PT'S PAIN GOAL Description: Pain level <3. Outcome: Completed/Met

## 2019-03-05 NOTE — Progress Notes (Signed)
Social Work Discharge Note   The overall goal for the admission was met for:   Discharge location: Yes-HOME WITH WIFE AND TWO DAUGHTER'S TO ASSIST AWARE WILL NEED 24 HR CARE  Length of Stay: Yes-23 DAYS  Discharge activity level: Yes-MIN-SUPERVISION LEVEL-MOSTLY WHEELCHAIR LEVEL  Home/community participation: Yes  Services provided included: MD, RD, PT, OT, RN, CM, TR, Pharmacy and SW  Financial Services: Private Insurance: [facility]  Follow-up services arranged: Home Health: [facility], DME: [facility] WHEELCHA[facility], WIDE ROLLING WALKER AND 30 TRANSFER BOARD and Patient/Family has no preference for HH/DME agencies  Comments (or additional information):DAUGHTER AND WIFE CAME IN FOR EDUCATION AND AWARE WILL NEED 24 HR PHYSICAL CARE. TEAM RECOMMENDED RAMP AND FAMILY RESISTANT TO THIS, FEEL CAN BUMP UP STAIRS. AWARE NEED TO GO TO HD CLINIC TODAY TO SIGN FORMS FOR HD TO BEGIN TOMORROW.    Patient/Family verbalized understanding of follow-up arrangements: Yes  Individual responsible for coordination of the follow-up plan: [name]-WIFE AND MARY-DAUGHTER  Confirmed correct DME delivered: Elease Hashimoto 03/05/2019    Elease Hashimoto

## 2019-03-05 NOTE — Progress Notes (Signed)
Pt DC home today. Daughter present for DC. DC summary complete by PA. No questions/concerns noted. PT to transport pt down to main lobby and practice car transfers and bumping up sidewalk. Pt belongings packed and ready to go.   Erie Noe, RN

## 2019-03-05 NOTE — Progress Notes (Signed)
Occupational Therapy Discharge Summary  Patient Details  Name: Scott Mcdowell MRN: [id number] Date of Birth: 01/18/1941    Patient has met 9 of 9 long term goals due to improved activity tolerance, improved balance, postural control and ability to compensate for deficits.  Patient to discharge at overall Mod Assist level.  Patient's care partner is independent to provide the necessary physical and cognitive assistance at discharge.    Reasons goals not met: n/a  Recommendation:  Patient will benefit from ongoing skilled OT services in home health setting to continue to advance functional skills in the area of BADL and Reduce care partner burden.  Equipment: w/c, BSC  Reasons for discharge: treatment goals met and discharge from hospital  Patient/family agrees with progress made and goals achieved: Yes  OT Discharge Precautions/Restrictions  Precautions Precautions: Fall Restrictions Weight Bearing Restrictions: No ADL ADL Grooming: supervision Where Assessed-Grooming: Edge of bed Upper Body Bathing: Supervision/safety Where Assessed-Upper Body Bathing: Edge of bed Lower Body Bathing: Minimal assistance Where Assessed-Lower Body Bathing: Edge of bed Upper Body Dressing: Minimal assistance Where Assessed-Upper Body Dressing: Edge of bed Lower Body Dressing: Minimal assistance Where Assessed-Lower Body Dressing: Edge of bed Toileting: Moderate assistance Toilet Transfer: Minimal assistance Toilet Transfer Method: (Stedy) Toilet Transfer Equipment: Bedside commode Vision Baseline Vision/History: Wears glasses Wears Glasses: At all times Patient Visual Report: No change from baseline Vision Assessment?: No apparent visual deficits Perception  Perception: Within Functional Limits Praxis Praxis: Intact Cognition Overall Cognitive Status: Within Functional Limits for tasks assessed Arousal/Alertness: Awake/alert Orientation Level: Oriented X4 Attention:  Focused;Sustained Focused Attention: Appears intact Sustained Attention: Appears intact Selective Attention: Appears intact Memory Impairment: Decreased short term memory Decreased Short Term Memory: Verbal complex;Functional complex Awareness: Appears intact Safety/Judgment: Appears intact Sensation Sensation Light Touch: Appears Intact Proprioception: Appears Intact Coordination Gross Motor Movements are Fluid and Coordinated: No Fine Motor Movements are Fluid and Coordinated: No Coordination and Movement Description: R hemi Finger Nose Finger Test: unable RUE Heel Shin Test: unable RLE Motor  Motor Motor - Skilled Clinical Observations: hypotonic on R side Motor - Discharge Observations: Increased tone on R side at d/c, continues to have weakness affecting functional mobility Mobility  Bed Mobility Supine to Sit: Independent with assistive device Sit to Supine: Minimal Assistance - Patient > 75% Transfers Sit to Stand: Moderate Assistance - Patient 50-74% Stand to Sit: Moderate Assistance - Patient 50-74%  Trunk/Postural Assessment  Cervical Assessment Cervical Assessment: Within Functional Limits Thoracic Assessment Thoracic Assessment: Within Functional Limits Lumbar Assessment Lumbar Assessment: (posterior pelvic tilt) Postural Control Postural Control: Within Functional Limits  Balance Static Sitting Balance Static Sitting - Level of Assistance: 7: Independent Dynamic Sitting Balance Dynamic Sitting - Level of Assistance: 7: Independent Static Standing Balance Static Standing - Level of Assistance: 4: Min assist Dynamic Standing Balance Dynamic Standing - Level of Assistance: 4: Min assist Extremity/Trunk Assessment RUE Assessment Passive Range of Motion (PROM) Comments: Shoulder flexion to 90*, elbow flexion/extension WNL Active Range of Motion (AROM) Comments: shoulder flexion to 20* gravity eliminated, minimal elbow flexion, loose gross grasp General  Strength Comments: 1-2/5 LUE Assessment LUE Assessment: Within Functional Limits   ,  Lynsey 03/05/2019, 4:06 PM 

## 2019-03-05 NOTE — Progress Notes (Signed)
I am totally impressed with the incredible work that has taken place on the rehab floor for Scott Mcdowell.  He really looks great.  This is probably the best that I have seen him look.  He is going home today.  I will set things up for him to be followed at the cancer center down in [place].  I think that he would do okay with treatment given his performance status being so much better than when he came in.  He has been in the hospital now for about 7 weeks.  He has made very nice improvement.  I am sure that this is mostly from the incredible staff on the rehab unit.  He is doing dialysis.  This seems to be gone along pretty well.  He has had no obvious bleeding.  He is on Eliquis.  He had a thrombus in the right upper arm.  The right upper arm is wrapped.  Is not look nearly as swollen.  His recent labs from yesterday shows white cell count to be 7.2.  Hemoglobin 8.6.  Platelet count 241,000.  His sodium 139.  Potassium 4.  His BUN is 51 with a creatinine of 4.86.  I doubt that he will ever have normal renal function.  He has the lambda light chain myeloma.  His last lambda light chain was checked about 3 weeks ago and showed a level of 13,044 mg/L.  A 24-hour urine that was done in late June showed a lambda light chain excretion of 622 mg/L.  This actually is marked improvement from May when he came in.  He is only had pulse high-dose Decadron for his "myeloma therapy."  On his exam, his vital signs show temperature of 97.1.  Pulse 85.  Blood pressure 132/46.  His head neck exam shows no ocular or oral lesions.  He has no thrush.  No adenopathy noted in the neck.  Lungs are clear.  Cardiac exam regular rate and rhythm.  He may have an extra beat.  Abdomen is soft.  Bowel sounds are present.  There is no guarding or rebound tenderness.  Extremities shows wrapping on the right upper extremity.  Again I do not see much or feel much swelling there.  He has no obvious swelling in his legs.   There is still some weakness over on the right side.   Again, I am just very happy that Scott Mcdowell has improved with his performance status.  I think that he will do much better now with myeloma therapy.  I will have this arranged down at the cancer center in [place] where he lives.  Lattie Haw, MD  Psalm 27:4

## 2019-03-05 NOTE — Progress Notes (Signed)
Physical Therapy Session Note  Patient Details  Name: Scott Mcdowell MRN: [id number] Date of Birth: Mar 26, 1941  Today's Date: 03/05/2019 PT Individual Time: 1130-1215 PT Individual Time Calculation (min): 45 min   Short Term Goals: Week 3:  PT Short Term Goal 1 (Week 3): = LTGs due to LOS  Skilled Therapeutic Interventions/Progress Updates:     Patient in w/c with daughter in room for family education for bumping w/c up a step for simulated home entry and car transfer for d/c today. Patient alert and agreeable to PT session and denied pain this morning. Patient's w/c and bariatric RW were delivered and PT set up equipment demonstrating how to adjust equipment for his daughter.    Therapeutic Activity: Transfers: Patient performed stand pivot using the RW with R hand split with min A and increased time to perform transfer. Provided verbal cues for leaning forward to stand. He performed a slide board transfer into the car with his daughters assistance with min A and increased time due to incline into the car. Patient's daughter demonstrated safe guarding technique and body mechanics during transfer.  Wheelchair Mobility:  Patient was transported with total A from his room to the front entrance of the hospital for time management and energy conservation. PT demonstrated bumping the patient up a curb and patient's 2 daughters performed bumping up a curb x2 demonstrating good body mechanics and safe guarding. PT educated on activating emergency response in the event of a fall or if bumping up the steps appears unsafe for the patient or family assisting. PT emphasised the benefits of putting in a ramp for reduced caregiver burden, as patient will need to leave the house 3x per week for dialysis. Also informed family that patient was able to propel himself up/down a ramp yesterday with PT with CGA for safety. Family agreed that a ramp may be beneficial and that they would consider putting one in  if needed.   Patient in personal vehicle in the care of his family for d/c at end of session.    Therapy Documentation Precautions:  Precautions Precautions: Fall Precaution Comments: R hemiparesis, h/o 1 fall in past 1 year; hemiparesis more apparent due to debility Restrictions Weight Bearing Restrictions: No    Therapy/Group: Individual Therapy  [name] PT, DPT  03/05/2019, 1:37 PM

## 2019-03-06 LAB — ACID FAST CULTURE WITH REFLEXED SENSITIVITIES (MYCOBACTERIA): Acid Fast Culture: NEGATIVE

## 2019-03-08 ENCOUNTER — Encounter: Payer: Self-pay | Admitting: *Deleted

## 2019-03-08 NOTE — Progress Notes (Signed)
Per Dr. Marin Olp, I faxed a New Patient Referral to Dr. Lavera Guise at [facility]. Fax # is [phone].

## 2019-03-09 ENCOUNTER — Telehealth: Payer: Self-pay | Admitting: Registered Nurse

## 2019-03-09 NOTE — Telephone Encounter (Signed)
Transitional Care call  Patient name: Scott Mcdowell DOB: 1940-12-27 1. Are you/is patient experiencing any problems since coming home? No a. Are there any questions regarding any aspect of care? No 2. Are there any questions regarding medications administration/dosing? No a. Are meds being taken as prescribed? Yes b. "Patient should review meds with caller to confirm" Medication List Reviewed 3. Have there been any falls? No 4. Has Home Health been to the house and/or have they contacted you? Yes, [facility]. a. If not, have you tried to contact them? NA b. Can we help you contact them? NA 5. Are bowels and bladder emptying properly? 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 / Bowels Moving Properly a. Are there any unexpected incontinence issues? No b. If applicable, is patient following bowel/bladder programs? NA 6. Any fevers, problems with breathing, unexpected pain? No 7. Are there any skin problems or new areas of breakdown? He has a sacral decubiti and reports his daughter is performing dressing changes.  8. Has the patient/family member arranged specialty MD follow up (ie cardiology/neurology/renal/surgical/etc.)?  [name] was instructed to call his PCP, Dr. Marin Olp and Dr. Havery Moros to schedule HFU appointments. He verbalizes understanding.  a. Can we help arrange? (                                     ) 9. Does the patient need any other services or support that we can help arrange? No 10. Are caregivers following through as expected in assisting the patient? Yes 11. Has the patient quit smoking, drinking alcohol, or using drugs as recommended? (                        )  Appointment date/time 03/19/2019 for Tele-health vist between 11:20 and 11:40. He verbalizes understanding.

## 2019-03-10 DIAGNOSIS — C9000 Multiple myeloma not having achieved remission: Secondary | ICD-10-CM

## 2019-03-12 ENCOUNTER — Other Ambulatory Visit: Payer: Self-pay

## 2019-03-12 DIAGNOSIS — N186 End stage renal disease: Secondary | ICD-10-CM

## 2019-03-17 ENCOUNTER — Telehealth (HOSPITAL_COMMUNITY): Payer: Self-pay | Admitting: *Deleted

## 2019-03-17 NOTE — Telephone Encounter (Signed)
The above patient or their representative was contacted and gave the following answers to these questions:         Do you have any of the following symptoms?n  Fever                    Cough                   Shortness of breath  Do  you have any of the following other symptoms? n   muscle pain         vomiting,        diarrhea        rash         weakness        red eye        abdominal pain         bruising          bruising or bleeding              joint pain           severe headache    Have you been in contact with someone who was or has been sick in the past 2 weeks?n  Yes                 Unsure                         Unable to assess   Does the person that you were in contact with have any of the following symptoms?   Cough         shortness of breath           muscle pain         vomiting,            diarrhea            rash            weakness           fever            red eye           abdominal pain           bruising  or  bleeding                joint pain                severe headache               Have you  or someone you have been in contact with traveled internationally in th last month?         If yes, which countries?   Have you  or someone you have been in contact with traveled outside [place] in th last month?         If yes, which state and city?   COMMENTS OR ACTION PLAN FOR THIS PATIENT:         

## 2019-03-18 ENCOUNTER — Ambulatory Visit (HOSPITAL_COMMUNITY)
Admission: RE | Admit: 2019-03-18 | Discharge: 2019-03-18 | Disposition: A | Discharge status: Home / Self Care | Payer: Medicare Other | Source: Ambulatory Visit | Attending: Vascular Surgery | Admitting: Vascular Surgery

## 2019-03-18 ENCOUNTER — Encounter: Payer: Self-pay | Admitting: Vascular Surgery

## 2019-03-18 ENCOUNTER — Other Ambulatory Visit: Payer: Self-pay

## 2019-03-18 ENCOUNTER — Ambulatory Visit (INDEPENDENT_AMBULATORY_CARE_PROVIDER_SITE_OTHER): Payer: Medicare Other | Admitting: Vascular Surgery

## 2019-03-18 VITALS — BP 129/78 | HR 81 | Temp 97.9°F | Resp 20 | Ht 74.0 in | Wt 218.0 lb

## 2019-03-18 DIAGNOSIS — N186 End stage renal disease: Secondary | ICD-10-CM

## 2019-03-18 DIAGNOSIS — Z992 Dependence on renal dialysis: Secondary | ICD-10-CM

## 2019-03-18 NOTE — Progress Notes (Signed)
Patient is an 78 year old male who returns for postoperative follow-up today after placement of a left brachiocephalic AV fistula February 05, 2019.  The patient is currently dialyzing via right sided catheter.  His dialysis today is Tuesday Thursday Saturday.  He has no numbness or tingling in his hand.  Physical exam:  Vitals:   03/18/19 1502  BP: 129/78  Pulse: 81  Resp: 20  Temp: 97.9 F (36.6 C)  SpO2: 96%  Weight: 218 lb (98.9 kg)  Height: 6\' 2"  (1.88 m)    Left upper extremity: Palpable thrill in fistula fistula is palpable throughout most of the upper arm.  It is more developed proximally than it is distally.  Data: Patient had duplex ultrasound of his AV fistula today.  This showed no significant narrowing.  It was 5 to 5-1/2 mm in diameter.  There are a few side branches distally.  Assessment: Maturing AV fistula left arm.  Patient was instructed today on how to exercise the fistula to get this to continue to develop.  He will return in 1 month's time with 1 of our APP's to see if it is not mature enough to consider cannulation.  We will obtain ultrasound of his AV fistula again at that office visit.  Plan: See above  [name], MD [facility] Office: [phone] Pager: [phone]

## 2019-03-19 ENCOUNTER — Encounter: Payer: Self-pay | Admitting: Physical Medicine & Rehabilitation

## 2019-03-19 ENCOUNTER — Encounter: Payer: Medicare Other | Admitting: Physical Medicine & Rehabilitation

## 2019-03-19 ENCOUNTER — Encounter: Payer: Medicare Other | Attending: Physical Medicine & Rehabilitation | Admitting: Physical Medicine & Rehabilitation

## 2019-03-19 VITALS — BP 120/73 | Temp 97.9°F | Ht 75.0 in | Wt 213.0 lb

## 2019-03-19 DIAGNOSIS — C9000 Multiple myeloma not having achieved remission: Secondary | ICD-10-CM | POA: Diagnosis not present

## 2019-03-19 DIAGNOSIS — Z992 Dependence on renal dialysis: Secondary | ICD-10-CM

## 2019-03-19 DIAGNOSIS — R5381 Other malaise: Secondary | ICD-10-CM | POA: Diagnosis not present

## 2019-03-19 DIAGNOSIS — N058 Unspecified nephritic syndrome with other morphologic changes: Secondary | ICD-10-CM

## 2019-03-19 DIAGNOSIS — I6930 Unspecified sequelae of cerebral infarction: Secondary | ICD-10-CM

## 2019-03-19 DIAGNOSIS — D7589 Other specified diseases of blood and blood-forming organs: Secondary | ICD-10-CM

## 2019-03-19 DIAGNOSIS — R609 Edema, unspecified: Secondary | ICD-10-CM

## 2019-03-19 DIAGNOSIS — R600 Localized edema: Secondary | ICD-10-CM

## 2019-03-19 DIAGNOSIS — I82621 Acute embolism and thrombosis of deep veins of right upper extremity: Secondary | ICD-10-CM

## 2019-03-19 DIAGNOSIS — N186 End stage renal disease: Secondary | ICD-10-CM | POA: Diagnosis not present

## 2019-03-19 DIAGNOSIS — I951 Orthostatic hypotension: Secondary | ICD-10-CM

## 2019-03-19 NOTE — Progress Notes (Signed)
Subjective:    Patient ID: Scott Mcdowell, male    DOB: 21-Feb-1941, 78 y.o.   MRN: [id number]  TELEHEALTH NOTE  Due to national recommendations of social distancing due to COVID 19, an audio/video telehealth visit is felt to be most appropriate for this patient at this time.  See Chart message from today for the patient's consent to telehealth from [place].     I verified that I am speaking with the correct person using two identifiers.  Location of patient: Home Location of provider: Office Method of communication: Telephone Names of participants : Zorita Pang scheduling, Jasmine December obtaining consent and vitals if available Established patient Time spent on call: 13 minutes  HPI Right-handed male with history of hypertension, CVA with residual right side weakness maintained on aspirin presents for hospital follow up debility.  At discharge, he was instructed to follow up with Nephro and Vasc.  Vasc notes reviewed, preparing fistula.  He has not followed up with GI.  He is following up with Onc. Edema has improved. Denies falls.  Therapies: 2/week DME: Sliding board Mobility: Walker and wheelchair  Pain Inventory Average Pain 0 Pain Right Now 0 My pain is no pain  In the last 24 hours, has pain interfered with the following? General activity 0 Relation with others 0 Enjoyment of life 0 What TIME of day is your pain at its worst? no pain Sleep (in general) Good  Pain is worse with: no pain Pain improves with: no pain Relief from Meds: no pain  Mobility walk with assistance use a walker use a wheelchair needs help with transfers  Function retired I need assistance with the following:  dressing, bathing, toileting, meal prep, household duties and shopping  Neuro/Psych trouble walking  Prior Studies ultrasound  Physicians involved in your care Vascular    Family History  Problem Relation Age of Onset  .  Cancer Mother   . Parkinson's disease Father    Social History   Socioeconomic History  . Marital status: Married    Spouse name: Not on file  . Number of children: Not on file  . Years of education: Not on file  . Highest education level: Not on file  Occupational History  . Not on file  Social Needs  . Financial resource strain: Not on file  . Food insecurity    Worry: Not on file    Inability: Not on file  . Transportation needs    Medical: Not on file    Non-medical: Not on file  Tobacco Use  . Smoking status: Former Smoker    Types: Cigarettes  . Smokeless tobacco: Never Used  Substance and Sexual Activity  . Alcohol use: Yes    Alcohol/week: 5.0 standard drinks    Types: 5 Cans of beer per week  . Drug use: Not on file  . Sexual activity: Not on file  Lifestyle  . Physical activity    Days per week: Not on file    Minutes per session: Not on file  . Stress: Not on file  Relationships  . Social Herbalist on phone: Not on file    Gets together: Not on file    Attends religious service: Not on file    Active member of club or organization: Not on file    Attends meetings of clubs or organizations: Not on file    Relationship status: Not on file  Other Topics Concern  .  Not on file  Social History Narrative  . Not on file   Past Surgical History:  Procedure Laterality Date  . AV FISTULA PLACEMENT Left 02/05/2019   Procedure: ARTERIOVENOUS FISTULA CREATION LEFT ARM;  Surgeon: Elam Dutch, MD;  Location: [facility] OR;  Service: Vascular;  Laterality: Left;  . ESOPHAGOGASTRODUODENOSCOPY N/A 01/21/2019   Procedure: ESOPHAGOGASTRODUODENOSCOPY (EGD);  Surgeon: Yetta Flock, MD;  Location: [facility] END[facility];  Service: Gastroenterology;  Laterality: N/A;  at bedside. PCCM intubating patient and he will be ready for EGD by 10: 30-10:40am  . ESOPHAGOGASTRODUODENOSCOPY (EGD) WITH PROPOFOL N/A 01/31/2019   Procedure: ESOPHAGOGASTRODUODENOSCOPY (EGD) WITH  PROPOFOL;  Surgeon: Juanita Craver, MD;  Location: [facility] ENDOSCOPY;  Service: Endoscopy;  Laterality: N/A;  . HEMOSTASIS CLIP PLACEMENT  01/21/2019   Procedure: HEMOSTASIS CLIP PLACEMENT;  Surgeon: Yetta Flock, MD;  Location: [facility] ENDOSCOPY;  Service: Gastroenterology;;  . HEMOSTASIS CONTROL  01/21/2019   Procedure: HEMOSTASIS CONTROL;  Surgeon: Yetta Flock, MD;  Location: [facility] ENDOSCOPY;  Service: Gastroenterology;;  Girtha Rm Probe  . INSERTION OF DIALYSIS CATHETER Right 02/05/2019   Procedure: INSERTION OF DIALYSIS CATHETER RIGHT INTERNAL JUGULAR;  Surgeon: Elam Dutch, MD;  Location: [facility];  Service: Vascular;  Laterality: Right;  . IR FLUORO GUIDE CV LINE RIGHT  01/20/2019  . IR US GUIDE VASC ACCESS RIGHT  01/20/2019  . SCLEROTHERAPY  01/21/2019   Procedure: SCLEROTHERAPY;  Surgeon: Yetta Flock, MD;  Location: [facility] ENDOS[facility];  Service: Gastroenterology;;   Past Medical History:  Diagnosis Date  . Goals of care, counseling/discussion 01/29/2019  . HTN (hypertension)   . Lambda light chain myeloma ([place]) 01/29/2019  . Stroke [facility]) 2004   w right sided weakness   BP 120/73   Temp 97.9 F (36.6 C)   Ht 6\' 3"  (1.905 m)   Wt 213 lb (96.6 kg)   SpO2 96%   BMI 26.62 kg/m   Opioid Risk Score:   Fall Risk Score:  `1  Depression screen PHQ 2/9  No flowsheet data found.  Review of Systems  Constitutional: Negative.   HENT: Negative.   Eyes: Negative.   Respiratory: Negative.   Cardiovascular: Negative.   Gastrointestinal: Negative.   Endocrine: Negative.   Genitourinary: Negative.   Musculoskeletal: Positive for gait problem.  Skin: Negative.   Allergic/Immunologic: Negative.   Neurological: Positive for weakness.  Hematological: Negative.   Psychiatric/Behavioral: Negative.   All other systems reviewed and are negative.      Objective:   Physical Exam Gen: NAD. Pulm: Effort normal Neuro: Alert and oriented    Assessment & Plan:  Right-handed male with  history of hypertension, CVA with residual right side weakness maintained on aspirin presents for hospital follow up debility.  1. Debility secondary to acute renal failure/ascites/multi-medical             Continue therapies  2.  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 GI bleed.  EGD 01/21/2019.    States he was told by GI no follow up needed  4. 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    Status post left brachiocephalic AV fistula.    Cont follow up with Nephro  5.  Plasma cell myeloma.    Cont follow Onc  6.  Orthostasis.  ProAmatine 15 mg 3 times daily.    Improved on meds  7.  History of CVA with recurrent right hemiparesis.  Meds reviewed Referrals reviewed All questions answered

## 2019-04-05 DIAGNOSIS — C9000 Multiple myeloma not having achieved remission: Secondary | ICD-10-CM

## 2019-04-12 DIAGNOSIS — C9000 Multiple myeloma not having achieved remission: Secondary | ICD-10-CM | POA: Diagnosis not present

## 2019-04-16 ENCOUNTER — Other Ambulatory Visit: Payer: Self-pay

## 2019-04-16 DIAGNOSIS — N186 End stage renal disease: Secondary | ICD-10-CM

## 2019-04-16 DIAGNOSIS — Z992 Dependence on renal dialysis: Secondary | ICD-10-CM

## 2019-04-21 ENCOUNTER — Ambulatory Visit: Payer: Self-pay | Admitting: Family

## 2019-04-21 ENCOUNTER — Encounter: Payer: Self-pay | Admitting: Family

## 2019-04-21 ENCOUNTER — Ambulatory Visit (HOSPITAL_COMMUNITY)
Admission: RE | Admit: 2019-04-21 | Discharge: 2019-04-21 | Disposition: A | Discharge status: Home / Self Care | Payer: Medicare Other | Source: Ambulatory Visit | Attending: Family | Admitting: Family

## 2019-04-21 ENCOUNTER — Other Ambulatory Visit: Payer: Self-pay

## 2019-04-21 VITALS — BP 155/78 | HR 87 | Temp 98.1°F | Resp 12 | Ht 75.0 in | Wt 213.0 lb

## 2019-04-21 DIAGNOSIS — Z992 Dependence on renal dialysis: Secondary | ICD-10-CM

## 2019-04-21 DIAGNOSIS — N186 End stage renal disease: Secondary | ICD-10-CM | POA: Insufficient documentation

## 2019-04-21 DIAGNOSIS — I770 Arteriovenous fistula, acquired: Secondary | ICD-10-CM

## 2019-04-21 NOTE — Progress Notes (Signed)
CC: second duplex follow up s/p AVF placement left upper arm  History of Present Illness  Scott Mcdowell is a 78 y.o. (August 08, 1941) male who is s/p left brachiocephalic AV fistula February 05, 2019 by Dr. Oneida Alar.    The patient is currently dialyzing via right sided catheter.  His dialysis is Tuesday Thursday Saturday.  He has no numbness or tingling in his left hand.  Dr. Oneida Alar last evaluated pt on 03-08-19. At that time left upper extremity AVF had a palpable thrill , fistula was palpable throughout most of the upper arm.  It was more developed proximally than it was distally. Duplex ultrasound of his AV fistula that day showed no significant narrowing.  It was 5 to 5-1/2 mm in diameter.  There were a few side branches distally. Maturing AV fistula left arm.  Patient was instructed how to exercise the fistula to get this to continue to develop.  He was to return in 1 month with duplex of the AVF and see 1 of our APP's to see if it is not mature enough to consider cannulation.   He returns today for access duplex and evaluation.    Past Medical History:  Diagnosis Date  . Goals of care, counseling/discussion 01/29/2019  . HTN (hypertension)   . Lambda light chain myeloma ([place]) 01/29/2019  . Stroke [facility]) 2004   w right sided weakness    Social History Social History   Tobacco Use  . Smoking status: Former Smoker    Types: Cigarettes  . Smokeless tobacco: Never Used  Substance Use Topics  . Alcohol use: Yes    Alcohol/week: 5.0 standard drinks    Types: 5 Cans of beer per week  . Drug use: Not on file    Family History Family History  Problem Relation Age of Onset  . Cancer Mother   . Parkinson's disease Father     Surgical History Past Surgical History:  Procedure Laterality Date  . AV FISTULA PLACEMENT Left 02/05/2019   Procedure: ARTERIOVENOUS FISTULA CREATION LEFT ARM;  Surgeon: Elam Dutch, MD;  Location: [facility] OR;  Service: Vascular;  Laterality: Left;  .  ESOPHAGOGASTRODUODENOSCOPY N/A 01/21/2019   Procedure: ESOPHAGOGASTRODUODENOSCOPY (EGD);  Surgeon: Yetta Flock, MD;  Location: [facility];  Service: Gastroenterology;  Laterality: N/A;  at bedside. PCCM intubating patient and he will be ready for EGD by 10: 30-10:40am  . ESOPHAGOGASTRODUODENOSCOPY (EGD) WITH PROPOFOL N/A 01/31/2019   Procedure: ESOPHAGOGASTRODUODENOSCOPY (EGD) WITH PROPOFOL;  Surgeon: Juanita Craver, MD;  Location: [facility] ENDOSCOPY;  Service: Endoscopy;  Laterality: N/A;  . HEMOSTASIS CLIP PLACEMENT  01/21/2019   Procedure: HEMOSTASIS CLIP PLACEMENT;  Surgeon: Yetta Flock, MD;  Location: [facility] ENDOSCOPY;  Service: Gastroenterology;;  . HEMOSTASIS CONTROL  01/21/2019   Procedure: HEMOSTASIS CONTROL;  Surgeon: Yetta Flock, MD;  Location: [facility] ENDOSCOPY;  Service: Gastroenterology;;  Girtha Rm Probe  . INSERTION OF DIALYSIS CATHETER Right 02/05/2019   Procedure: INSERTION OF DIALYSIS CATHETER RIGHT INTERNAL JUGULAR;  Surgeon: Elam Dutch, MD;  Location: [place];  Service: Vascular;  Laterality: Right;  . IR FLUORO GUIDE CV LINE RIGHT  01/20/2019  . IR US GUIDE VASC ACCESS RIGHT  01/20/2019  . SCLEROTHERAPY  01/21/2019   Procedure: SCLEROTHERAPY;  Surgeon: Yetta Flock, MD;  Location: [facility];  Service: Gastroenterology;;    No Known Allergies  Current Outpatient Medications  Medication Sig Dispense Refill  . acyclovir (ZOVIRAX) 400 MG tablet Take one with breastfast    .  apixaban (ELIQUIS) 5 MG TABS tablet Take 1 tablet (5 mg total) by mouth 2 (two) times daily. 60 tablet 1  . atorvastatin (LIPITOR) 10 MG tablet Take 1 tablet (10 mg total) by mouth daily. 30 tablet 0  . cyclophosphamide (CYTOXAN) 25 MG capsule Take 25 mg by mouth daily. Take with food to minimize GI upset. Take early in the day and maintain hydration.    . cyclophosphamide (CYTOXAN) 50 MG capsule Take 50 mg by mouth daily. Take with food to minimize GI upset. Take early in the day and maintain  hydration.    . Darbepoetin Alfa (ARANESP) 200 MCG/0.4ML SOSY injection Inject 0.4 mLs (200 mcg total) into the vein every Tuesday with hemodialysis. 1.68 mL   . dexamethasone (DECADRON) 4 MG tablet Take 5 tablets on the day of chemo    . midodrine (PROAMATINE) 10 MG tablet Take 1 tablet (10 mg total) by mouth 3 (three) times daily with meals. 90 tablet 1  . multivitamin (RENA-VIT) TABS tablet Take 1 tablet by mouth at bedtime. 30 tablet 0  . ondansetron (ZOFRAN) 4 MG tablet     . pantoprazole (PROTONIX) 40 MG tablet Take 1 tablet (40 mg total) by mouth 2 (two) times daily. 60 tablet 1  . polyethylene glycol (MIRALAX / GLYCOLAX) 17 g packet Take 17 g by mouth daily. 14 each 0  . senna-docusate (SENOKOT-S) 8.6-50 MG tablet Take 1 tablet by mouth at bedtime.    . sevelamer carbonate (RENVELA) 800 MG tablet Take 3 tablets (2,400 mg total) by mouth 3 (three) times daily with meals. 90 tablet 1   No current facility-administered medications for this visit.      REVIEW OF SYSTEMS: see HPI for pertinent positives and negatives    PHYSICAL EXAMINATION:  Vitals:   04/21/19 1155  BP: (!) 155/78  Pulse: 87  Resp: 12  Temp: 98.1 F (36.7 C)  TempSrc: Temporal  SpO2: 97%  Weight: 213 lb (96.6 kg)  Height: 6\' 3"  (1.905 m)   Body mass index is 26.62 kg/m.  General: The patient appears his stated age, seated in w/c.   HEENT:  No gross abnormalities Pulmonary: Respirations are non-labored Abdomen: Soft and non-tender Musculoskeletal: There are no major deformities.   Neurologic: No focal weakness or paresthesias are detected, Skin: There are no ulcer or rashes noted. Psychiatric: The patient has normal affect. Cardiovascular: There is a regular rate and rhythm. Left radial pulse is 1+ palpable. Left upper arm AVF with palpable thrill and audible bruit more distally, audible pulse and palpable pulse more proximally.    Non-Invasive Vascular Imaging  Left upper arm Access Duplex   (Date: 04/21/2019):  Findings: +--------------------+----------+-----------------+--------+ AVF                 PSV (cm/s)Flow Vol (mL/min)Comments +--------------------+----------+-----------------+--------+ Native artery inflow   220          1446                +--------------------+----------+-----------------+--------+ AVF Anastomosis        527                     tortuous +--------------------+----------+-----------------+--------+   +------------+----------+-------------+----------+--------------------+ OUTFLOW VEINPSV (cm/s)Diameter (cm)Depth (cm)      Describe       +------------+----------+-------------+----------+--------------------+ Prox UA        129        0.54        0.67                        +------------+----------+-------------+----------+--------------------+  Mid UA         203        0.46        0.43   competing branch .29 +------------+----------+-------------+----------+--------------------+ Dist UA        159        0.56        0.20                        +------------+----------+-------------+----------+--------------------+ AC Fossa       578        0.58        0.31                        +------------+----------+-------------+----------+--------------------+ Summary: Patent arteriovenous fistula with no evidence for restenosis. .   Medical Decision Making  CALLUM WOLF is a 78 y.o. male who is s/p left brachiocephalic AV fistula February 05, 2019 by Dr. Oneida Alar.    I discussed with Dr. Oneida Alar AVF duplex results from today, no steal sx's. Diameters are marginal. I advised pt to pump his left fist several times daily to help increase the diameters of the AVF.   May start accessing left upper am AVF on 05-08-19 or later.  If trouble accessing AVF, may nee to be scheduled for ligation of competing branch.  Follow up with [place] as needed.     Clemon Chambers, RN, MSN, FNP-C [facility]  [place] Office: [phone]  04/21/2019, 12:21 PM  Clinic MD: Oneida Alar

## 2019-05-10 DIAGNOSIS — C9000 Multiple myeloma not having achieved remission: Secondary | ICD-10-CM | POA: Diagnosis not present

## 2019-06-07 DIAGNOSIS — C9000 Multiple myeloma not having achieved remission: Secondary | ICD-10-CM | POA: Diagnosis not present

## 2019-07-05 DIAGNOSIS — C9000 Multiple myeloma not having achieved remission: Secondary | ICD-10-CM

## 2019-08-23 DIAGNOSIS — C9000 Multiple myeloma not having achieved remission: Secondary | ICD-10-CM | POA: Diagnosis not present

## 2019-09-27 DIAGNOSIS — C9000 Multiple myeloma not having achieved remission: Secondary | ICD-10-CM

## 2019-11-25 DIAGNOSIS — C9000 Multiple myeloma not having achieved remission: Secondary | ICD-10-CM

## 2020-01-09 DIAGNOSIS — F509 Eating disorder, unspecified: Secondary | ICD-10-CM

## 2020-01-09 DIAGNOSIS — R509 Fever, unspecified: Secondary | ICD-10-CM | POA: Diagnosis not present

## 2020-01-09 DIAGNOSIS — A419 Sepsis, unspecified organism: Secondary | ICD-10-CM

## 2020-01-09 DIAGNOSIS — E860 Dehydration: Secondary | ICD-10-CM

## 2020-01-09 DIAGNOSIS — E876 Hypokalemia: Secondary | ICD-10-CM

## 2020-01-09 DIAGNOSIS — E8809 Other disorders of plasma-protein metabolism, not elsewhere classified: Secondary | ICD-10-CM | POA: Diagnosis not present

## 2020-01-09 DIAGNOSIS — E871 Hypo-osmolality and hyponatremia: Secondary | ICD-10-CM

## 2020-01-09 DIAGNOSIS — R7989 Other specified abnormal findings of blood chemistry: Secondary | ICD-10-CM

## 2020-01-09 DIAGNOSIS — C9000 Multiple myeloma not having achieved remission: Secondary | ICD-10-CM | POA: Diagnosis not present

## 2020-01-10 DIAGNOSIS — F509 Eating disorder, unspecified: Secondary | ICD-10-CM | POA: Diagnosis not present

## 2020-01-10 DIAGNOSIS — E876 Hypokalemia: Secondary | ICD-10-CM | POA: Diagnosis not present

## 2020-01-10 DIAGNOSIS — E8809 Other disorders of plasma-protein metabolism, not elsewhere classified: Secondary | ICD-10-CM | POA: Diagnosis not present

## 2020-01-10 DIAGNOSIS — E871 Hypo-osmolality and hyponatremia: Secondary | ICD-10-CM | POA: Diagnosis not present

## 2020-01-11 DIAGNOSIS — E8809 Other disorders of plasma-protein metabolism, not elsewhere classified: Secondary | ICD-10-CM | POA: Diagnosis not present

## 2020-01-11 DIAGNOSIS — E871 Hypo-osmolality and hyponatremia: Secondary | ICD-10-CM | POA: Diagnosis not present

## 2020-01-11 DIAGNOSIS — R509 Fever, unspecified: Secondary | ICD-10-CM | POA: Diagnosis not present

## 2020-01-11 DIAGNOSIS — F509 Eating disorder, unspecified: Secondary | ICD-10-CM | POA: Diagnosis not present

## 2020-01-11 DIAGNOSIS — E876 Hypokalemia: Secondary | ICD-10-CM | POA: Diagnosis not present

## 2020-01-12 DIAGNOSIS — F509 Eating disorder, unspecified: Secondary | ICD-10-CM | POA: Diagnosis not present

## 2020-01-12 DIAGNOSIS — E8809 Other disorders of plasma-protein metabolism, not elsewhere classified: Secondary | ICD-10-CM | POA: Diagnosis not present

## 2020-01-12 DIAGNOSIS — E871 Hypo-osmolality and hyponatremia: Secondary | ICD-10-CM | POA: Diagnosis not present

## 2020-01-12 DIAGNOSIS — E876 Hypokalemia: Secondary | ICD-10-CM | POA: Diagnosis not present

## 2020-01-13 DIAGNOSIS — E876 Hypokalemia: Secondary | ICD-10-CM | POA: Diagnosis not present

## 2020-01-13 DIAGNOSIS — E8809 Other disorders of plasma-protein metabolism, not elsewhere classified: Secondary | ICD-10-CM | POA: Diagnosis not present

## 2020-01-13 DIAGNOSIS — F509 Eating disorder, unspecified: Secondary | ICD-10-CM | POA: Diagnosis not present

## 2020-01-13 DIAGNOSIS — E871 Hypo-osmolality and hyponatremia: Secondary | ICD-10-CM | POA: Diagnosis not present

## 2020-01-26 DIAGNOSIS — C9000 Multiple myeloma not having achieved remission: Secondary | ICD-10-CM

## 2020-03-27 DIAGNOSIS — C9000 Multiple myeloma not having achieved remission: Secondary | ICD-10-CM | POA: Diagnosis not present

## 2020-06-13 ENCOUNTER — Other Ambulatory Visit: Payer: Self-pay | Admitting: Hematology and Oncology

## 2020-06-13 DIAGNOSIS — C9000 Multiple myeloma not having achieved remission: Secondary | ICD-10-CM

## 2020-06-16 IMAGING — US US THORACENTESIS ASP PLEURAL SPACE W/IMG GUIDE
1 series · 4 of 4 positions shown · non-contrast
Comparison: none

INDICATION: Shortness of breath. Possible right lower lobe pneumonia. Right
pleural effusion. Request for diagnostic and therapeutic
thoracentesis.

[Series 1: us thoracentesis asp pleural space w/img guide · 4 of 4 slices shown]
[im 1/4]
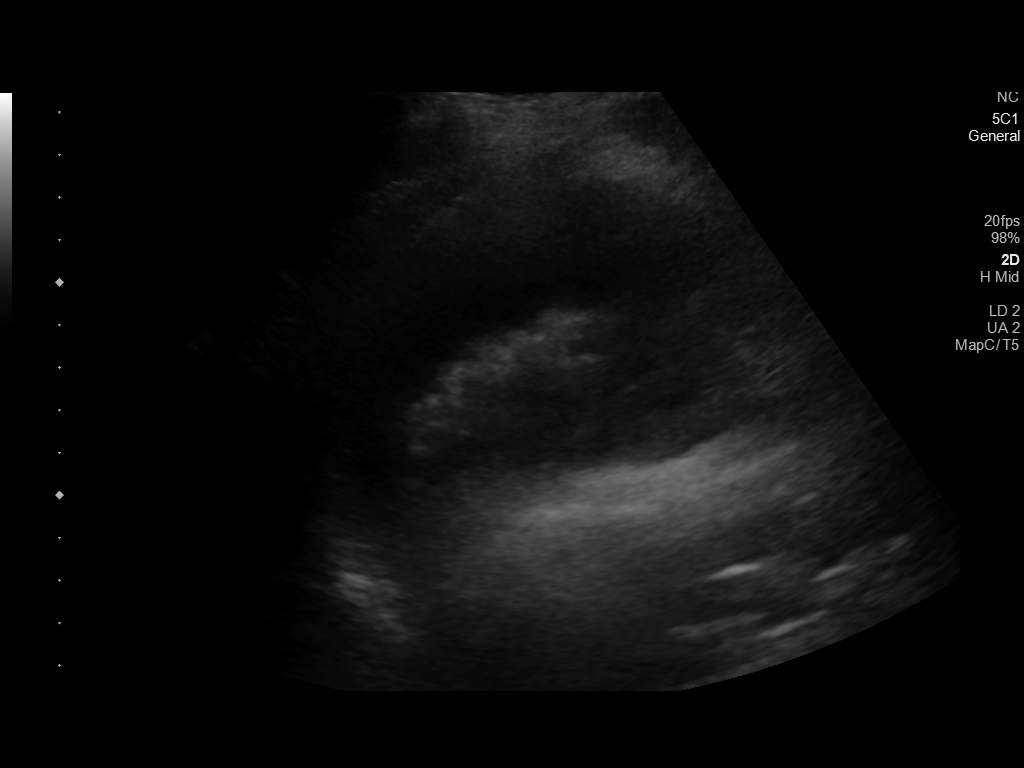
[im 2/4]
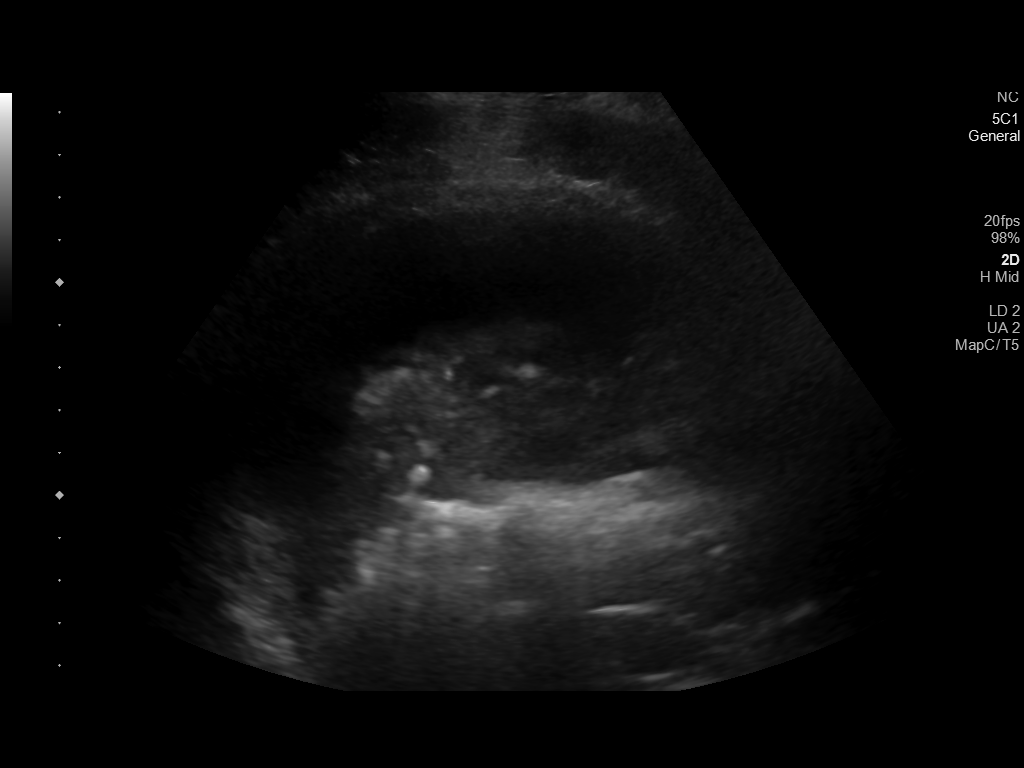
[im 3/4]
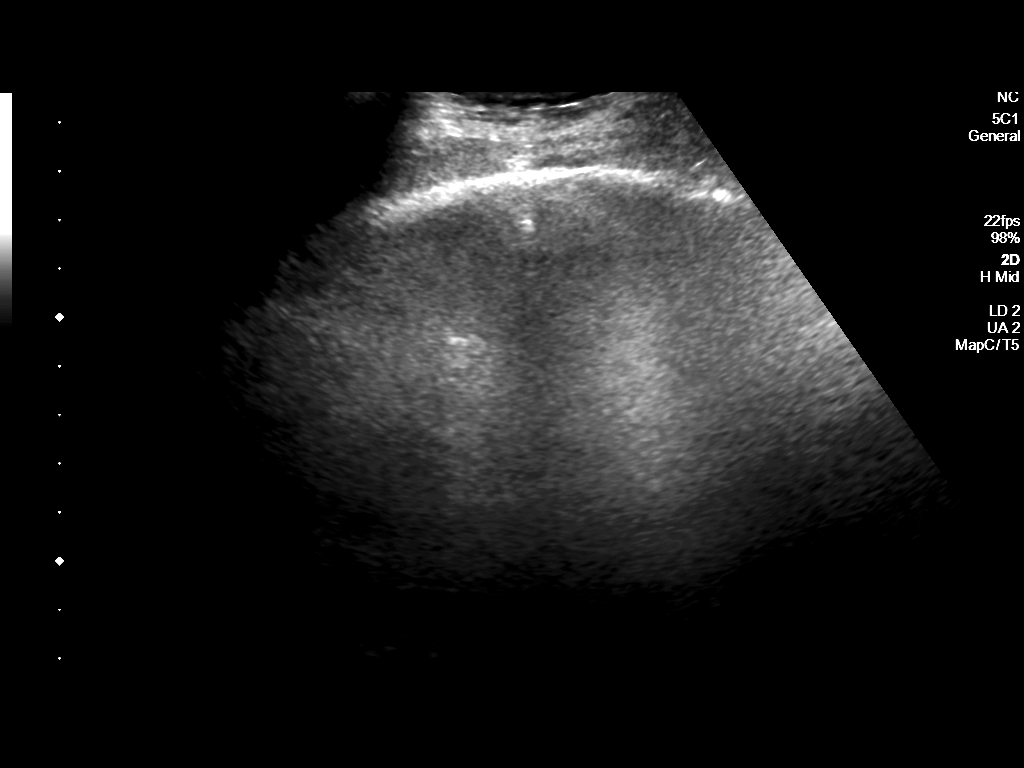
[im 4/4]
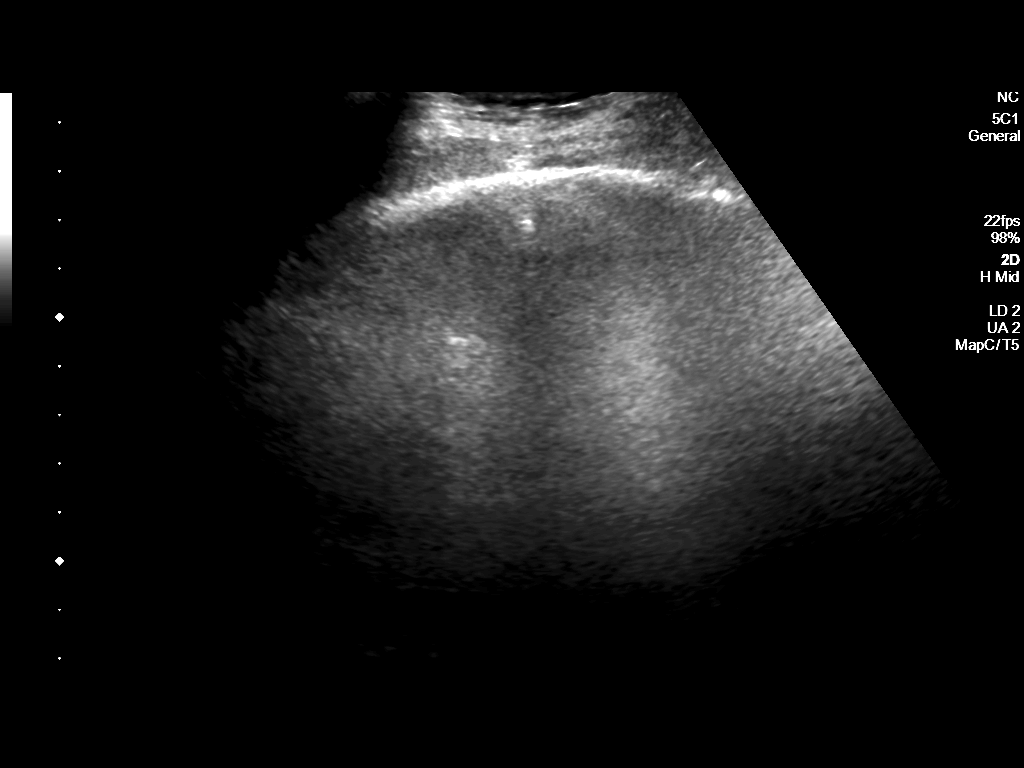

[4 of 4 positions shown; findings below may reference images not displayed]

EXAM:
ULTRASOUND GUIDED RIGHT THORACENTESIS

MEDICATIONS:
1% lidocaine 10 mL

COMPLICATIONS:
None immediate.

PROCEDURE:
An ultrasound guided thoracentesis was thoroughly discussed with the
patient and questions answered. The benefits, risks, alternatives
and complications were also discussed. The patient understands and
wishes to proceed with the procedure. Written consent was obtained.

Ultrasound was performed to localize and mark an adequate pocket of
fluid in the right chest. The area was then prepped and draped in
the normal sterile fashion. 1% Lidocaine was used for local
anesthesia. Under ultrasound guidance a 6 Fr Safe-T-Centesis
catheter was introduced. Thoracentesis was performed. The catheter
was removed and a dressing applied.
FINDINGS: A total of approximately 600 mL of clear yellow fluid was removed.
Samples were sent to the laboratory as requested by the clinical
team.
IMPRESSION: Successful ultrasound guided right thoracentesis yielding 600 mL of
pleural fluid.

No pneumothorax on post-procedure chest x-ray.

## 2020-06-20 IMAGING — US IR FLUORO GUIDE CV LINE*R*
1 series · 1 of 1 positions shown · non-contrast
Comparison: Chest CT-01/14/2019

INDICATION: Renal insufficiency. In need of intravenous access for the
initiation of dialysis.

EXAM:
NON-TUNNELED CENTRAL VENOUS HEMODIALYSIS CATHETER PLACEMENT WITH
ULTRASOUND AND FLUOROSCOPIC GUIDANCE

[Series 1: ir fluoro/shunt/fist · 1 of 1 slices shown]
[im 1/1]
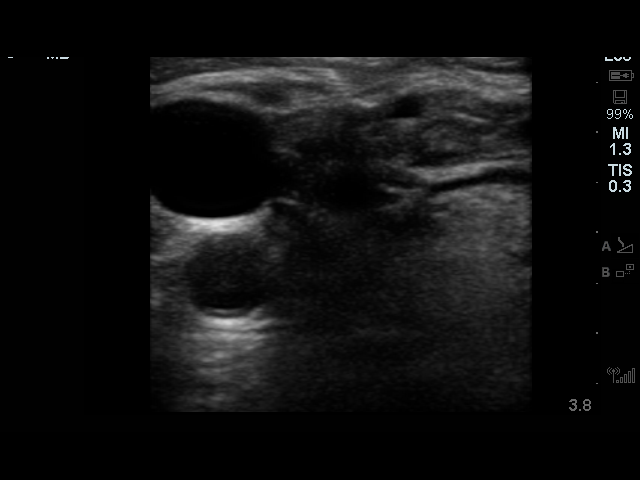

[1 of 1 positions shown; findings below may reference images not displayed]

MEDICATIONS:
None

FLUOROSCOPY TIME:  24 seconds (10 mGy)

COMPLICATIONS:
None immediate.



After the overlying soft tissues were anesthetized, a small venotomy
incision was created and a micropuncture kit was utilized to access
the internal jugular vein. Real-time ultrasound guidance was
utilized for vascular access including the acquisition of a
permanent ultrasound image documenting patency of the accessed
vessel. The microwire was utilized to measure appropriate catheter
length.

A stiff glidewire was advanced to the level of the IVC. Under
fluoroscopic guidance, the venotomy was serially dilated, ultimately
allowing placement of a 20 cm temporary Trialysis catheter with tip
ultimately terminating within the superior aspect of the right
atrium. Final catheter positioning was confirmed and documented with
a spot radiographic image. The catheter aspirates and flushes
normally. The catheter was flushed with appropriate volume heparin
dwells.

The catheter exit site was secured with a 0-Prolene retention
suture. A dressing was placed. The patient tolerated the procedure
well without immediate post procedural complication.
IMPRESSION: Successful placement of a right internal jugular approach 20 cm
temporary dialysis catheter with tip terminating with in the
superior aspect of the right atrium. The catheter is ready for
immediate use.

PLAN:
This catheter may be converted to a tunneled dialysis catheter at a
later date as indicated.

## 2020-06-20 IMAGING — DX PORTABLE CHEST - 1 VIEW
1 series · 1 of 1 positions shown · non-contrast
Comparison: 01/16/2019

CLINICAL DATA: Central line placement

EXAM:
PORTABLE CHEST 1 VIEW

[chest ap]
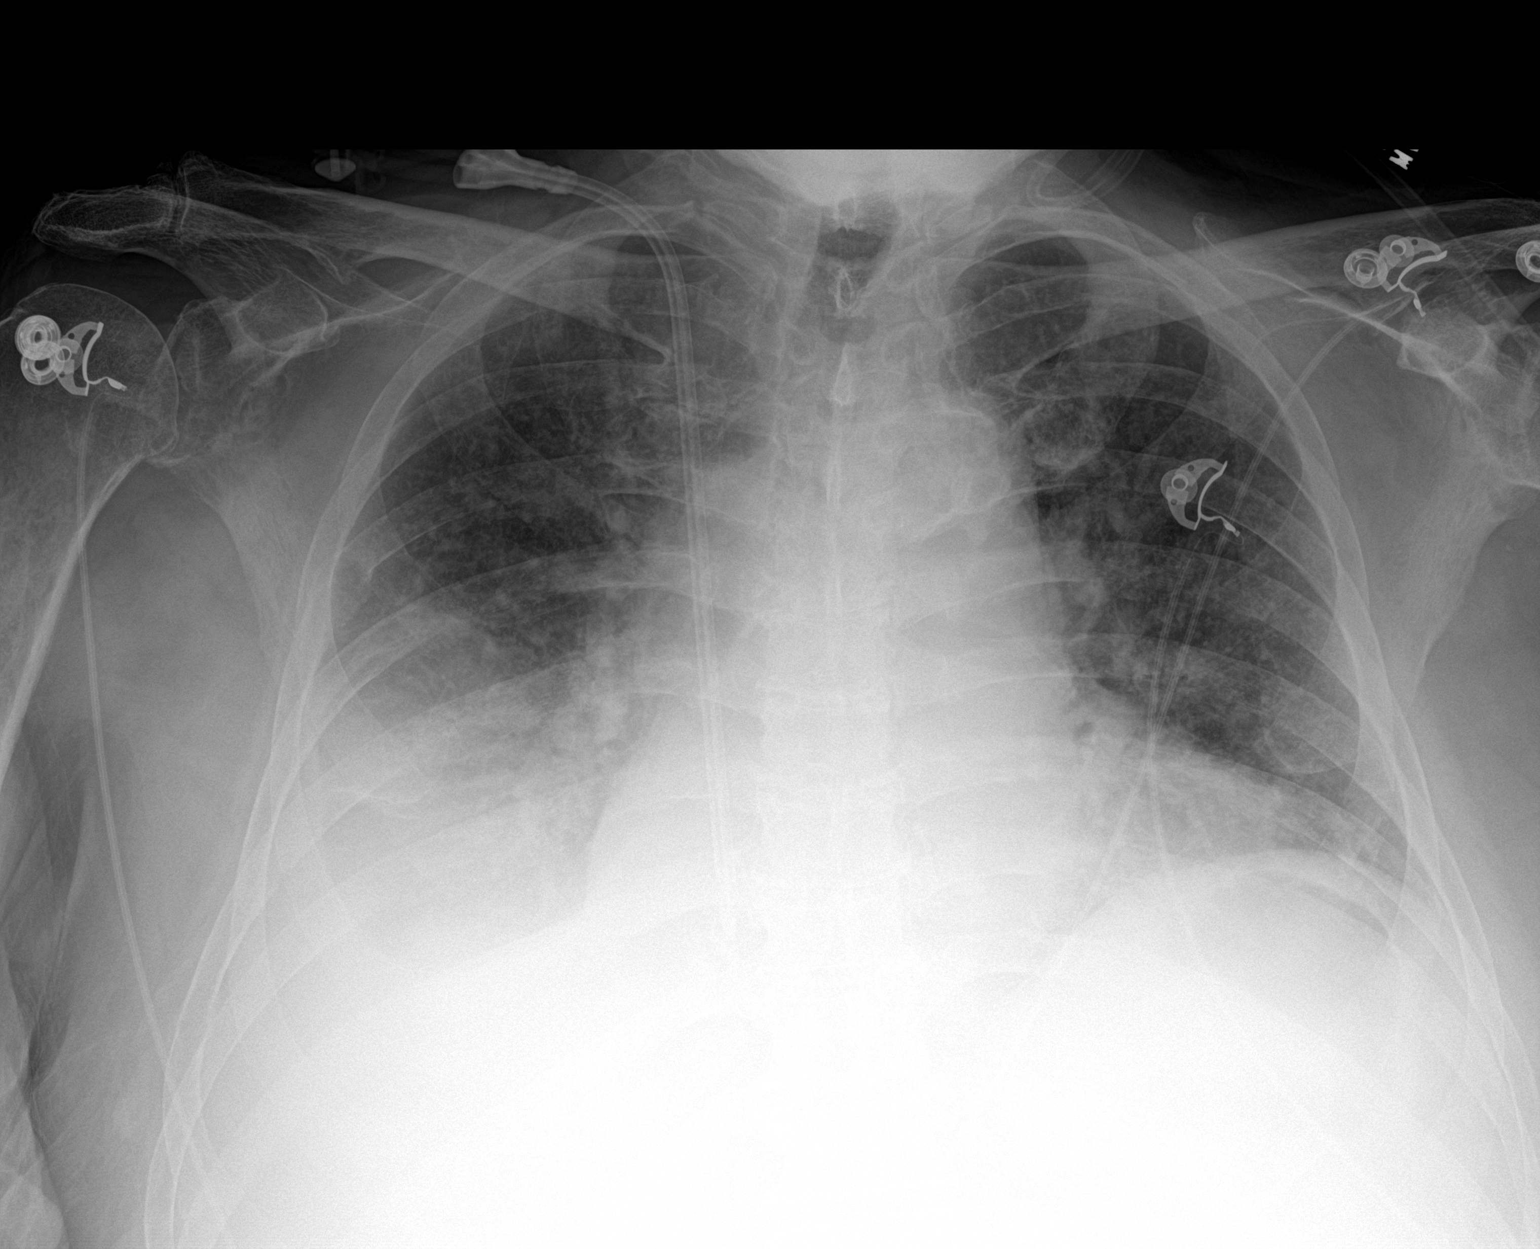

[1 of 1 positions shown; findings below may reference images not displayed]

FINDINGS: There is a well-positioned non tunneled right-sided central venous
catheter with tip projecting over the right atrium. The cardiac
silhouette is enlarged. There is no pneumothorax. There is a
moderate sized right-sided pleural effusion. There is a small
left-sided pleural effusion. There is generalized volume overload
with evidence of pulmonary edema.
IMPRESSION: 1. Well-positioned right-sided dialysis catheter.  No pneumothorax.
2. Cardiomegaly with volume overload including small to
moderate-sized bilateral pleural effusions.

## 2020-06-21 ENCOUNTER — Other Ambulatory Visit: Payer: Self-pay

## 2020-06-21 ENCOUNTER — Inpatient Hospital Stay: Payer: Medicare Other | Attending: Oncology

## 2020-06-21 DIAGNOSIS — C9000 Multiple myeloma not having achieved remission: Secondary | ICD-10-CM | POA: Diagnosis not present

## 2020-06-21 LAB — CMP (CANCER CENTER ONLY)
ALT: 51 U/L — ABNORMAL HIGH (ref 0–44)
AST: 33 U/L (ref 15–41)
Albumin: 3.7 g/dL (ref 3.5–5.0)
Alkaline Phosphatase: 57 U/L (ref 38–126)
Anion gap: 13 (ref 5–15)
BUN: 40 mg/dL — ABNORMAL HIGH (ref 8–23)
CO2: 27 mmol/L (ref 22–32)
Calcium: 8.5 mg/dL — ABNORMAL LOW (ref 8.9–10.3)
Chloride: 99 mmol/L (ref 98–111)
Creatinine: 2.14 mg/dL — ABNORMAL HIGH (ref 0.61–1.24)
GFR, Estimated: 30 mL/min — ABNORMAL LOW
Glucose, Bld: 128 mg/dL — ABNORMAL HIGH (ref 70–99)
Potassium: 3.1 mmol/L — ABNORMAL LOW (ref 3.5–5.1)
Sodium: 139 mmol/L (ref 135–145)
Total Bilirubin: 0.8 mg/dL (ref 0.3–1.2)
Total Protein: 6.7 g/dL (ref 6.5–8.1)

## 2020-06-21 LAB — CBC WITH DIFFERENTIAL (CANCER CENTER ONLY)
Abs Immature Granulocytes: 0.03 K/uL (ref 0.00–0.07)
Basophils Absolute: 0 K/uL (ref 0.0–0.1)
Basophils Relative: 0 %
Eosinophils Absolute: 0.2 K/uL (ref 0.0–0.5)
Eosinophils Relative: 5 %
HCT: 28.6 % — ABNORMAL LOW (ref 39.0–52.0)
Hemoglobin: 9 g/dL — ABNORMAL LOW (ref 13.0–17.0)
Immature Granulocytes: 1 %
Lymphocytes Relative: 37 %
Lymphs Abs: 1.2 K/uL (ref 0.7–4.0)
MCH: 33 pg (ref 26.0–34.0)
MCHC: 31.5 g/dL (ref 30.0–36.0)
MCV: 104.8 fL — ABNORMAL HIGH (ref 80.0–100.0)
Monocytes Absolute: 0.5 K/uL (ref 0.1–1.0)
Monocytes Relative: 14 %
Neutro Abs: 1.4 K/uL — ABNORMAL LOW (ref 1.7–7.7)
Neutrophils Relative %: 43 %
Platelet Count: 127 K/uL — ABNORMAL LOW (ref 150–400)
RBC: 2.73 MIL/uL — ABNORMAL LOW (ref 4.22–5.81)
RDW: 17.2 % — ABNORMAL HIGH (ref 11.5–15.5)
WBC Count: 3.3 K/uL — ABNORMAL LOW (ref 4.0–10.5)
nRBC: 0 % (ref 0.0–0.2)

## 2020-06-22 LAB — PROTEIN ELECTROPHORESIS, SERUM, WITH REFLEX
A/G Ratio: 1.1 (ref 0.7–1.7)
Albumin ELP: 3.2 g/dL (ref 2.9–4.4)
Alpha-1-Globulin: 0.3 g/dL (ref 0.0–0.4)
Alpha-2-Globulin: 0.8 g/dL (ref 0.4–1.0)
Beta Globulin: 1 g/dL (ref 0.7–1.3)
Gamma Globulin: 0.9 g/dL (ref 0.4–1.8)
Globulin, Total: 3 g/dL (ref 2.2–3.9)
Total Protein ELP: 6.2 g/dL (ref 6.0–8.5)

## 2020-06-22 LAB — KAPPA/LAMBDA LIGHT CHAINS
Kappa free light chain: 84.1 mg/L — ABNORMAL HIGH (ref 3.3–19.4)
Kappa, lambda light chain ratio: 1.39 (ref 0.26–1.65)
Lambda free light chains: 60.4 mg/L — ABNORMAL HIGH (ref 5.7–26.3)

## 2020-06-22 IMAGING — DX PORTABLE CHEST - 1 VIEW
1 series · 1 of 1 positions shown · non-contrast
Comparison: Radiograph January 21, 2019.

CLINICAL DATA: Endotracheal tube present.

EXAM:
PORTABLE CHEST 1 VIEW

[chest ap]
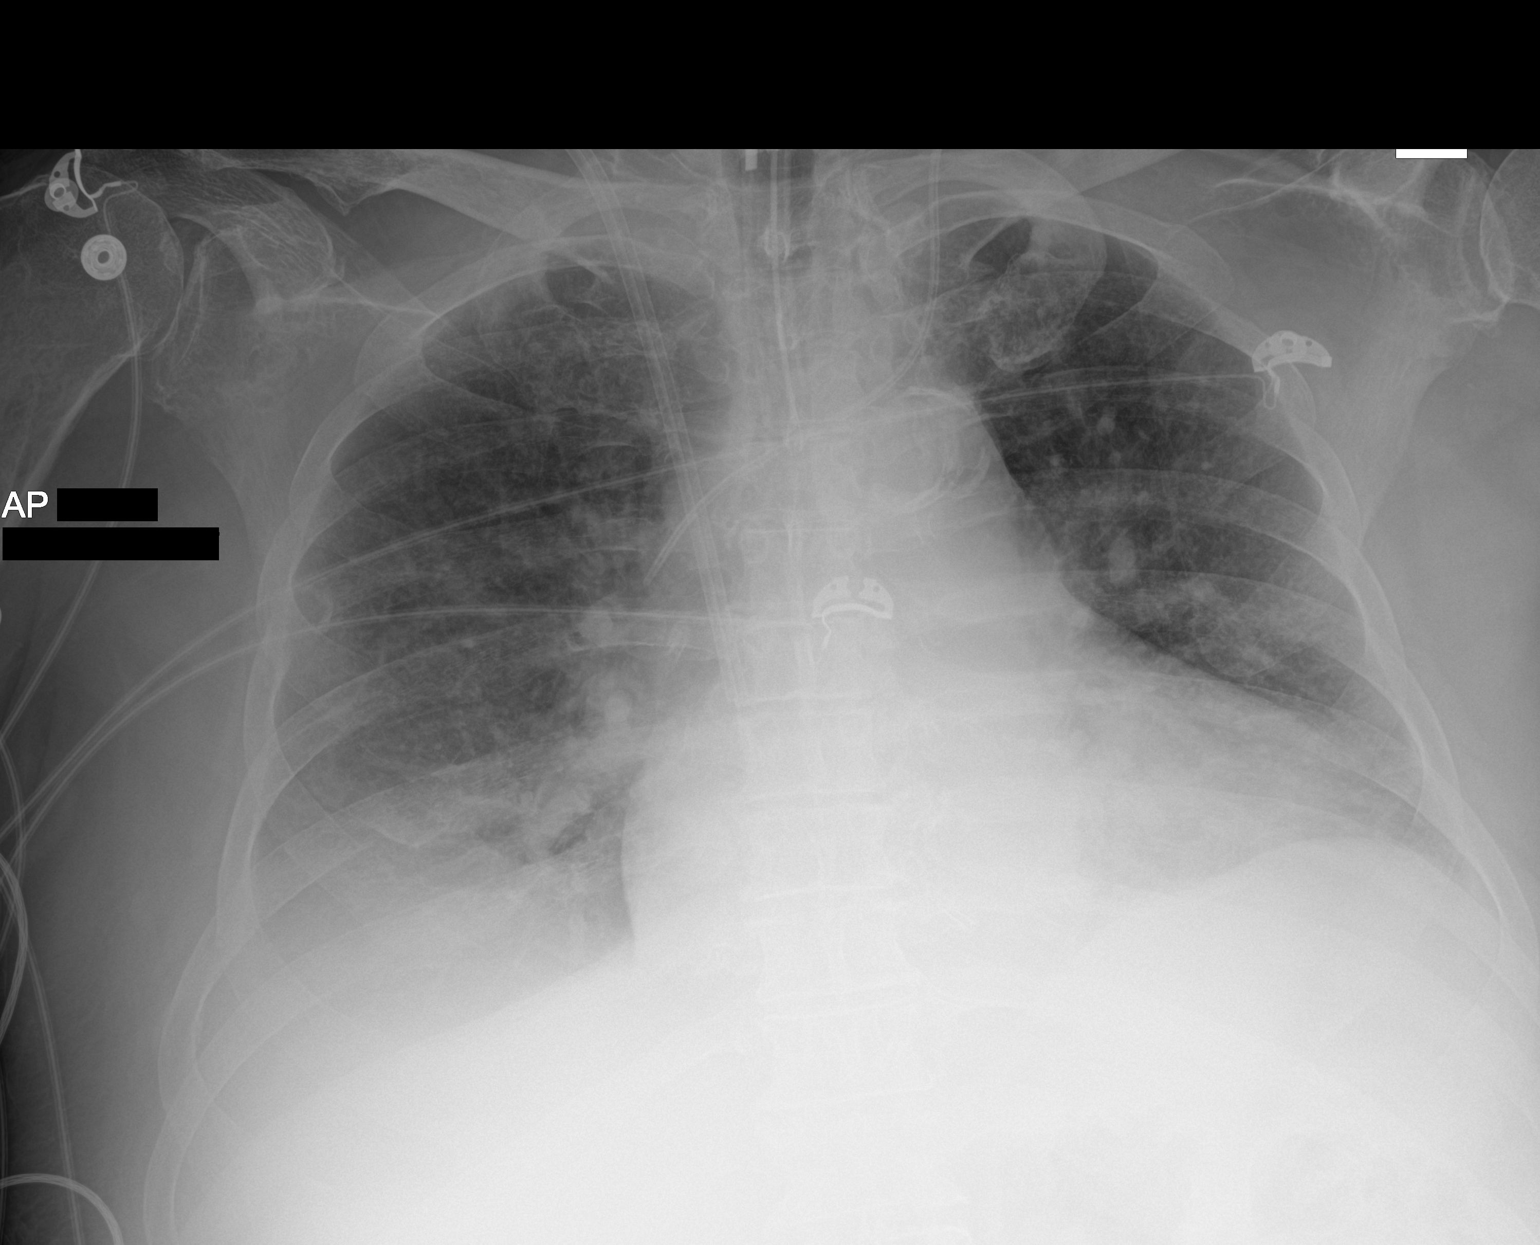

[1 of 1 positions shown; findings below may reference images not displayed]

FINDINGS: Stable cardiomegaly. Endotracheal tube is in good position. Right
internal jugular dialysis catheter is unchanged. Left internal
jugular catheter is unchanged. No pneumothorax is noted. Left lung
is clear. Stable right basilar atelectasis or infiltrate is noted
with associated pleural effusion. Bony thorax is unremarkable.
IMPRESSION: Stable support apparatus. Stable right basilar opacity as described
above.

Aortic Atherosclerosis ([id number]).

## 2020-06-23 IMAGING — DX PORTABLE CHEST - 1 VIEW
1 series · 1 of 1 positions shown · non-contrast
Comparison: Chest radiograph 01/22/2019

CLINICAL DATA: Acute respiratory failure

EXAM:
PORTABLE CHEST 1 VIEW

[chest ap]
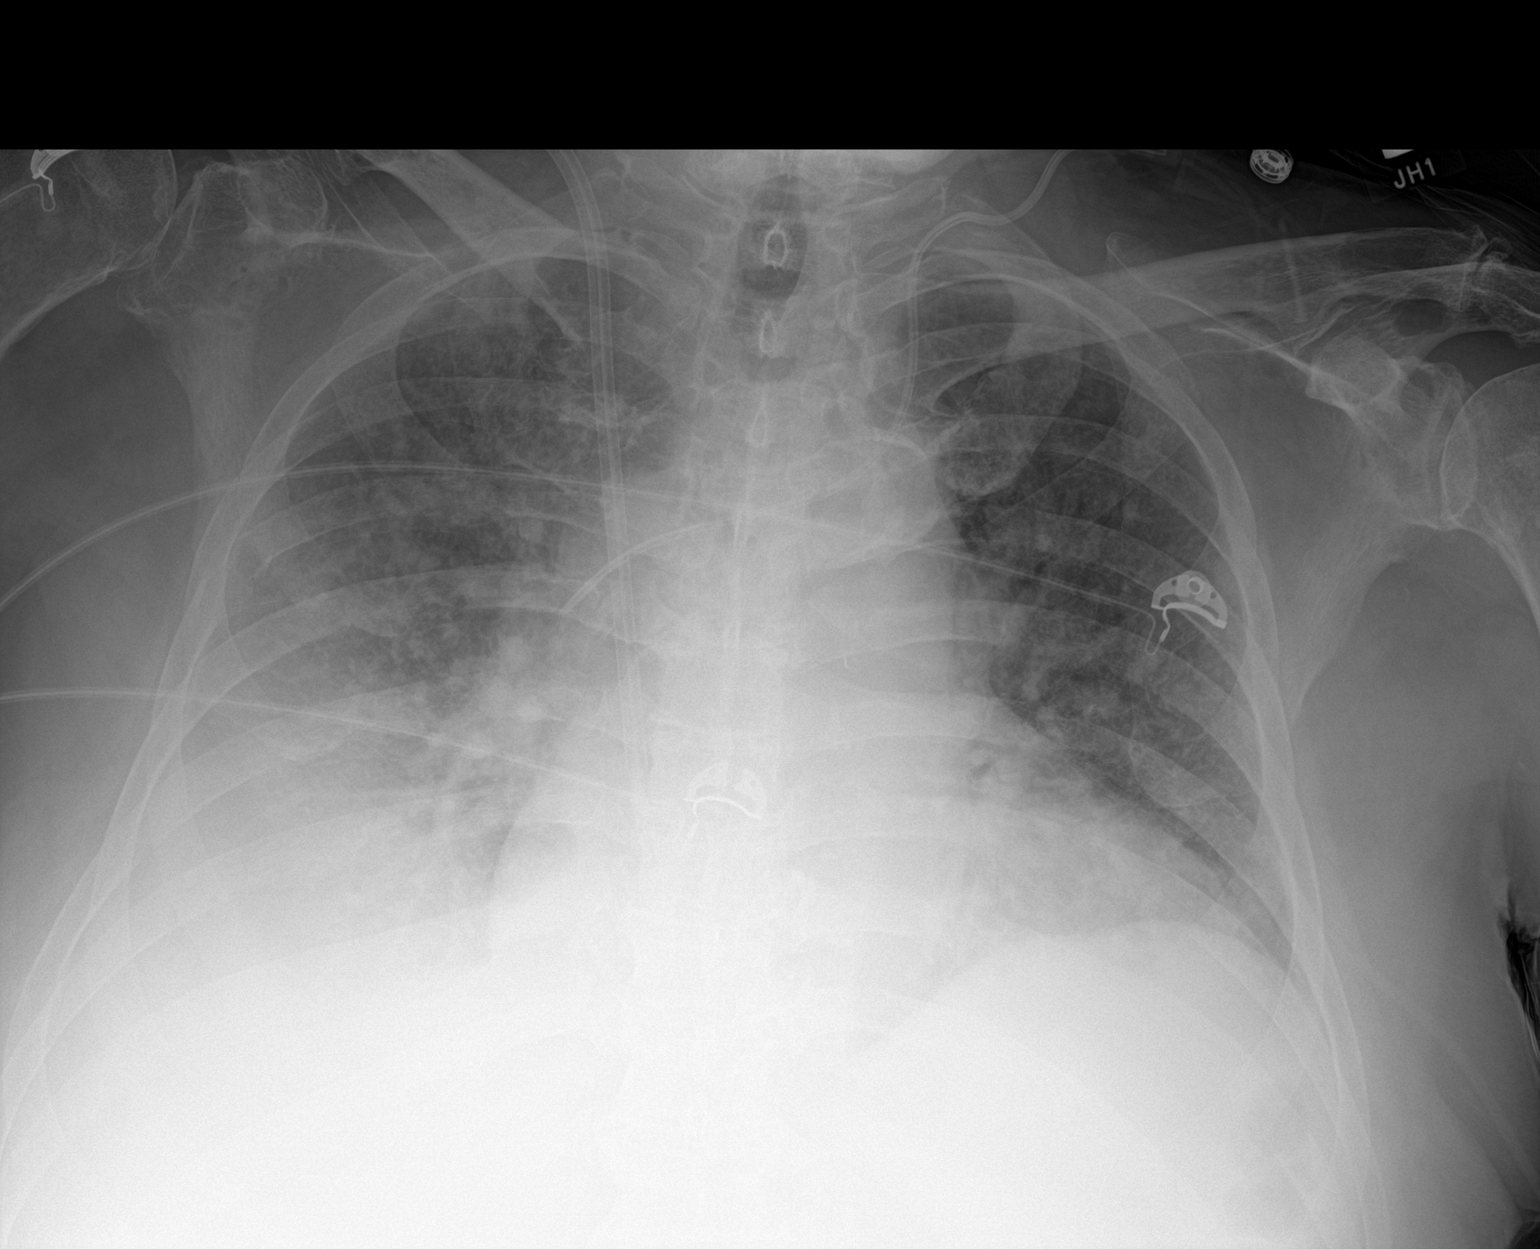

[1 of 1 positions shown; findings below may reference images not displayed]

FINDINGS: Central venous catheter tip projects over the superior vena cava.
Dual lumen central venous catheter tip projects over the superior
vena cava. Monitoring leads overlie the patient. Stable
cardiomegaly. Aortic atherosclerosis. Interval extubation. Low lung
volumes. Layering right pleural effusion. Diffuse bilateral
interstitial opacities, mildly increased, right greater than left.
No pneumothorax.
IMPRESSION: Interval extubation.

Persistent layering right pleural effusion. Mild increased bilateral
airspace opacities, right greater than left.

Layering right pleural effusion.

## 2020-06-26 ENCOUNTER — Encounter: Payer: Self-pay | Admitting: Pharmacist

## 2020-06-26 DIAGNOSIS — C9000 Multiple myeloma not having achieved remission: Secondary | ICD-10-CM

## 2020-06-26 NOTE — Progress Notes (Signed)
[facility]  [address] [place],  [place]  [address] [phone]  Clinic Day:  06/27/2020  Referring physician: [name], MD   HISTORY OF PRESENT ILLNESS:  The patient is a 79 y.o. 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  He comes in today for routine followup.  The patient continues to take his Revlimid/Decadron on a daily basis, for which he has no problem tolerating.  As it pertains to his multiple myeloma, he denies having fatigue, bone pain or findings which concern him for overt progression of his multiple myeloma.   PHYSICAL EXAM:  Blood pressure (!) 256/120, pulse 100, temperature 97.7 F (36.5 C), temperature source Oral, resp. rate 18, height $RemoveBe'6\' 3"'SzkEAuBbD$  (1.905 m), weight 169 lb 6.4 oz (76.8 kg), SpO2 99 %. Wt Readings from Last 3 Encounters:  06/27/20 169 lb 6.4 oz (76.8 kg)  06/20/20 168 lb 9.6 oz (76.5 kg)  04/21/19 213 lb (96.6 kg)   Body mass index is 21.17 kg/m. Performance status (ECOG): 3 Physical Exam Constitutional:      Appearance: Normal appearance. He is ill-appearing (chronically-ill appearing gentleman in a wheelchair).  HENT:     Mouth/Throat:     Mouth: Mucous membranes are moist.     Pharynx: Oropharynx is clear. No oropharyngeal exudate or posterior oropharyngeal erythema.  Cardiovascular:     Rate and Rhythm: Normal rate and regular rhythm.     Heart sounds: No murmur heard.  No friction rub. No gallop.   Pulmonary:     Effort: Pulmonary effort is normal. No respiratory distress.     Breath sounds: Normal breath sounds. No wheezing, rhonchi or rales.  Abdominal:     General: Bowel sounds are normal. There is no distension.     Palpations: Abdomen is soft. There is no mass.     Tenderness: There is no abdominal tenderness.  Musculoskeletal:        General: No swelling.     Cervical back: No rigidity.     Right lower leg: No edema.     Left lower leg: No edema.  Lymphadenopathy:     Cervical: No  cervical adenopathy.     Upper Body:     Right upper body: No supraclavicular or axillary adenopathy.     Left upper body: No supraclavicular or axillary adenopathy.     Lower Body: No right inguinal adenopathy. No left inguinal adenopathy.  Skin:    General: Skin is warm.     Coloration: Skin is not jaundiced.     Findings: No lesion or rash.  Neurological:     General: No focal deficit present.     Mental Status: He is alert and oriented to person, place, and time. Mental status is at baseline.     Cranial Nerves: Cranial nerves are intact.  Psychiatric:        Mood and Affect: Mood normal.        Behavior: Behavior normal.        Thought Content: Thought content normal.     LABS:   CBC Latest Ref Rng & Units 06/21/2020 03/04/2019 03/02/2019  WBC 4.0 - 10.5 K/uL 3.3(L) 7.2 5.6  Hemoglobin 13.0 - 17.0 g/dL 9.0(L) 8.6(L) 8.5(L)  Hematocrit 39 - 52 % 28.6(L) 28.6(L) 28.0(L)  Platelets 150 - 400 K/uL 127(L) 241 217   CMP Latest Ref Rng & Units 06/21/2020 03/04/2019 03/02/2019  Glucose 70 - 99 mg/dL 128(H) 107(H) 116(H)  BUN 8 - 23 mg/dL  40(H) 51(H) 63(H)  Creatinine 0.61 - 1.24 mg/dL 2.14(H) 4.86(H) 5.58(H)  Sodium 135 - 145 mmol/L 139 139 142  Potassium 3.5 - 5.1 mmol/L 3.1(L) 4.0 4.0  Chloride 98 - 111 mmol/L 99 99 101  CO2 22 - 32 mmol/L _0 Calcium 8.9 - 10.3 mg/dL 8.5(L) 9.7 9.7  Total Protein 6.5 - 8.1 g/dL 6.7 - -  Total Bilirubin 0.3 - 1.2 mg/dL 0.8 - -  Alkaline Phos 38 - 126 U/L 57 - -  AST 15 - 41 U/L 33 - -  ALT 0 - 44 U/L 51(H) - -     Ref. Range 06/21/2020 11:33  M-SPIKE, % Latest Ref Range: Not Observed g/dL Not Observed     Ref. Range 06/21/2020 11:33  Lamda free light chains Latest Ref Range: 5.7 - 26.3 mg/L 60.4 (H)     ASSESSMENT & PLAN:   Assessment/Plan:  A 79 y.o. 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  When evaluating his multiple myeloma parameters, his lambda light chain level has not changed significantly over these past months.  He  knows to continue taking his Revlimid/Decadron on a daily basis.   Due to his hypocalcemia, his Delton See will be held.  Clinically, the patient appears to be doing okay.  I will see this patient back in 3 months for repeat clinical assessment.  The patient understands all the plans discussed today and is in agreement with them.     Rolanda Campa Macarthur Critchley, MD

## 2020-06-27 ENCOUNTER — Other Ambulatory Visit: Payer: Self-pay | Admitting: Oncology

## 2020-06-27 ENCOUNTER — Other Ambulatory Visit: Payer: Self-pay

## 2020-06-27 ENCOUNTER — Inpatient Hospital Stay (INDEPENDENT_AMBULATORY_CARE_PROVIDER_SITE_OTHER): Payer: Medicare Other | Admitting: Oncology

## 2020-06-27 ENCOUNTER — Inpatient Hospital Stay: Payer: Medicare Other

## 2020-06-27 ENCOUNTER — Telehealth: Payer: Self-pay | Admitting: Oncology

## 2020-06-27 VITALS — BP 256/120 | HR 100 | Temp 97.7°F | Resp 18 | Ht 75.0 in | Wt 169.4 lb

## 2020-06-27 DIAGNOSIS — C9000 Multiple myeloma not having achieved remission: Secondary | ICD-10-CM

## 2020-06-27 NOTE — Telephone Encounter (Signed)
Per 11/9 LOS.Appt given to patient. °

## 2020-06-27 NOTE — Progress Notes (Signed)
Patient asked to see me, he had received a letter from the [employer] saying it was time to reapply. I let him know that he still has funding available through [employer], $2000+. Once this funding has been used we can look at applying for more funding if needed. He gets Revlimid through [employer].

## 2020-07-28 ENCOUNTER — Telehealth: Payer: Self-pay

## 2020-07-28 NOTE — Telephone Encounter (Signed)
Dr Bobby Rumpf agreed to re-certification order as requested below. I notified Jason,PT.    Received call from [place], [place], w/ [facility]. Corene Cornea is requesting re-certification orders for maintenance physical therapy weekly for next 8 weeks. [phone]

## 2020-08-10 ENCOUNTER — Encounter: Payer: Self-pay | Admitting: Oncology

## 2020-08-25 NOTE — Progress Notes (Signed)
Jason with [facility] called to let us know that they postponed the 30 day follow up evaluation due to the patient stating he had vomiting and diarrhea overnight.  Dr. Bobby Rumpf aware.

## 2020-08-28 NOTE — Progress Notes (Signed)
Patients co-pay for his Revlimid is $3586.72 due to start of new year. He does not have that much left on his [employer]. I spoke with patients wife and they are ok with me applying for new funding. Patient was approved for the [place], through 08/28/2021. I gave this information to [employer] and will inform patient.

## 2020-09-20 ENCOUNTER — Other Ambulatory Visit: Payer: Medicare Other

## 2020-09-27 ENCOUNTER — Inpatient Hospital Stay: Payer: Medicare Other | Attending: Oncology

## 2020-09-27 ENCOUNTER — Ambulatory Visit: Payer: Medicare Other | Admitting: Oncology

## 2020-09-27 ENCOUNTER — Other Ambulatory Visit: Payer: Self-pay

## 2020-09-27 ENCOUNTER — Other Ambulatory Visit: Payer: Self-pay | Admitting: Hematology and Oncology

## 2020-09-27 DIAGNOSIS — C9000 Multiple myeloma not having achieved remission: Secondary | ICD-10-CM | POA: Insufficient documentation

## 2020-09-27 LAB — CBC
MCV: 101 — AB (ref 80–94)
RBC: 2.71 — AB (ref 3.87–5.11)

## 2020-09-27 LAB — HEPATIC FUNCTION PANEL
ALT: 34 (ref 10–40)
AST: 23 (ref 14–40)
Alkaline Phosphatase: 68 (ref 25–125)
Bilirubin, Total: 1

## 2020-09-27 LAB — CBC AND DIFFERENTIAL
HCT: 27 — AB (ref 41–53)
Hemoglobin: 9.2 — AB (ref 13.5–17.5)
Neutrophils Absolute: 2.96
Platelets: 151 (ref 150–399)
WBC: 5.1

## 2020-09-27 LAB — BASIC METABOLIC PANEL
BUN: 49 — AB (ref 4–21)
CO2: 27 — AB (ref 13–22)
Chloride: 96 — AB (ref 99–108)
Creatinine: 2.1 — AB (ref 0.6–1.3)
Glucose: 116
Potassium: 3.4 (ref 3.4–5.3)
Sodium: 135 — AB (ref 137–147)

## 2020-09-27 LAB — COMPREHENSIVE METABOLIC PANEL
Albumin: 3.9 (ref 3.5–5.0)
Calcium: 8.3 — AB (ref 8.7–10.7)

## 2020-09-28 LAB — KAPPA/LAMBDA LIGHT CHAINS
Kappa free light chain: 83 mg/L — ABNORMAL HIGH (ref 3.3–19.4)
Kappa, lambda light chain ratio: 1.07 (ref 0.26–1.65)
Lambda free light chains: 77.7 mg/L — ABNORMAL HIGH (ref 5.7–26.3)

## 2020-09-29 LAB — PROTEIN ELECTROPHORESIS, SERUM
A/G Ratio: 1.3 (ref 0.7–1.7)
Albumin ELP: 3.5 g/dL (ref 2.9–4.4)
Alpha-1-Globulin: 0.3 g/dL (ref 0.0–0.4)
Alpha-2-Globulin: 0.7 g/dL (ref 0.4–1.0)
Beta Globulin: 0.9 g/dL (ref 0.7–1.3)
Gamma Globulin: 0.8 g/dL (ref 0.4–1.8)
Globulin, Total: 2.7 g/dL (ref 2.2–3.9)
Total Protein ELP: 6.2 g/dL (ref 6.0–8.5)

## 2020-10-02 ENCOUNTER — Inpatient Hospital Stay (INDEPENDENT_AMBULATORY_CARE_PROVIDER_SITE_OTHER): Payer: Medicare Other | Admitting: Oncology

## 2020-10-02 ENCOUNTER — Other Ambulatory Visit: Payer: Self-pay | Admitting: Oncology

## 2020-10-02 VITALS — BP 197/92 | HR 98 | Temp 97.9°F | Resp 16 | Ht 75.0 in | Wt 167.4 lb

## 2020-10-02 DIAGNOSIS — C9000 Multiple myeloma not having achieved remission: Secondary | ICD-10-CM | POA: Diagnosis not present

## 2020-10-03 ENCOUNTER — Telehealth: Payer: Self-pay

## 2020-10-03 NOTE — Telephone Encounter (Signed)
Corene Cornea, PT with [facility], LVM on triage line this morning requesting orders to renew PT maintenance orders to see pt once weekly for 8 weeks for gait training, transfers, strength training, & general safety education.  I notified Dr Bobby Rumpf of above. He is in agreement with physical therapy orders. I called Jason back & gave him orders as requested per Dr Bobby Rumpf.

## 2020-10-08 NOTE — Progress Notes (Signed)
[facility]  [address] [place],  [place]  [address] [phone]  Clinic Day:  10/02/2020  Referring physician: [name], MD   HISTORY OF PRESENT ILLNESS:  The patient is a 80 y.o. 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  He comes in today for routine followup.  The patient continues to take Revlimid/Decadron on a daily basis, from which he has no problems tolerating.  As it pertains to his multiple myeloma, he denies having worsening fatigue, bone pain or findings which concern him for overt progression of his multiple myeloma.   PHYSICAL EXAM:  Blood pressure (!) 197/92, pulse 98, temperature 97.9 F (36.6 C), resp. rate 16, height '6\' 3"'  (1.905 m), weight 167 lb 6.4 oz (75.9 kg), SpO2 99 %. Wt Readings from Last 3 Encounters:  10/02/20 167 lb 6.4 oz (75.9 kg)  06/27/20 169 lb 6.4 oz (76.8 kg)  06/20/20 168 lb 9.6 oz (76.5 kg)   Body mass index is 20.92 kg/m. Performance status (ECOG): 3 Physical Exam Constitutional:      Appearance: Normal appearance. He is ill-appearing (chronically-ill appearing gentleman in a wheelchair).  HENT:     Mouth/Throat:     Mouth: Mucous membranes are moist.     Pharynx: Oropharynx is clear. No oropharyngeal exudate or posterior oropharyngeal erythema.  Cardiovascular:     Rate and Rhythm: Normal rate and regular rhythm.     Heart sounds: No murmur heard. No friction rub. No gallop.   Pulmonary:     Effort: Pulmonary effort is normal. No respiratory distress.     Breath sounds: Normal breath sounds. No wheezing, rhonchi or rales.  Chest:  Breasts:     Right: No axillary adenopathy or supraclavicular adenopathy.     Left: No axillary adenopathy or supraclavicular adenopathy.    Abdominal:     General: Bowel sounds are normal. There is no distension.     Palpations: Abdomen is soft. There is no mass.     Tenderness: There is no abdominal tenderness.  Musculoskeletal:        General: No  swelling.     Cervical back: No rigidity.     Right lower leg: No edema.     Left lower leg: No edema.  Lymphadenopathy:     Cervical: No cervical adenopathy.     Upper Body:     Right upper body: No supraclavicular or axillary adenopathy.     Left upper body: No supraclavicular or axillary adenopathy.     Lower Body: No right inguinal adenopathy. No left inguinal adenopathy.  Skin:    General: Skin is warm.     Coloration: Skin is not jaundiced.     Findings: No lesion or rash.  Neurological:     General: No focal deficit present.     Mental Status: He is alert and oriented to person, place, and time. Mental status is at baseline.     Cranial Nerves: Cranial nerves are intact.  Psychiatric:        Mood and Affect: Mood normal.        Behavior: Behavior normal.        Thought Content: Thought content normal.     LABS:   CBC Latest Ref Rng & Units 09/27/2020 06/21/2020 03/04/2019  WBC - 5.1 3.3(L) 7.2  Hemoglobin 13.5 - 17.5 9.2(A) 9.0(L) 8.6(L)  Hematocrit 41 - 53 27(A) 28.6(L) 28.6(L)  Platelets 150 - 399 151 127(L) 241   CMP Latest Ref Rng &  Units 09/27/2020 06/21/2020 03/04/2019  Glucose 70 - 99 mg/dL - 128(H) 107(H)  BUN 4 - 21 49(A) 40(H) 51(H)  Creatinine 0.6 - 1.3 2.1(A) 2.14(H) 4.86(H)  Sodium 137 - 147 135(A) 139 139  Potassium 3.4 - 5.3 3.4 3.1(L) 4.0  Chloride 99 - 108 96(A) 99 99  CO2 13 - 22 27(A) 27 26  Calcium 8.7 - 10.7 8.3(A) 8.5(L) 9.7  Total Protein 6.5 - 8.1 g/dL - 6.7 -  Total Bilirubin 0.3 - 1.2 mg/dL - 0.8 -  Alkaline Phos 25 - 125 68 57 -  AST 14 - 40 23 33 -  ALT 10 - 40 34 51(H) -    Ref. Range 09/27/2020 14:06  M-SPIKE, % Latest Ref Range: Not Observed g/dL Not Observed    Ref. Range 06/21/2020 11:33 09/27/2020 00:00 09/27/2020 14:06  Lamda free light chains Latest Ref Range: 5.7 - 26.3 mg/L 60.4 (H)  77.7 (H)      ASSESSMENT & PLAN:   Assessment/Plan:  A 80 y.o. 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  When evaluating his multiple myeloma  parameters, his lambda light chain level has risen mildly over these past months.  He knows to continue taking his Revlimid/Decadron on a daily basis.   Due to his hypocalcemia, his Delton See will continue to be held.  Clinically, the patient appears to be doing okay.  I will see this patient back in 3 months for repeat clinical assessment.  The patient understands all the plans discussed today and is in agreement with them.    Jaxyn Mestas Macarthur Critchley, MD

## 2020-10-31 ENCOUNTER — Telehealth: Payer: Self-pay

## 2020-10-31 NOTE — Telephone Encounter (Signed)
I notified Dr Bobby Rumpf that [place], [name] had called to make [place] aware pt had missed some PT treatment day. The missed days were caused by one of two things.... either the staff were unable to get pt to answer the phone or he cancelled them coming out.  Dr Bobby Rumpf is agreeable to resume PT orders as previous orders. I notified Corene Cornea, PT w/ [facility].

## 2020-11-20 ENCOUNTER — Other Ambulatory Visit: Payer: Self-pay

## 2020-11-20 DIAGNOSIS — Z79899 Other long term (current) drug therapy: Secondary | ICD-10-CM

## 2020-11-20 DIAGNOSIS — Z299 Encounter for prophylactic measures, unspecified: Secondary | ICD-10-CM

## 2020-11-20 MED ORDER — DEXAMETHASONE 4 MG PO TABS: 4.0000 mg | ORAL_TABLET | Freq: Every day | ORAL | 1 refills | Status: DC

## 2020-11-21 ENCOUNTER — Telehealth: Payer: Self-pay

## 2020-11-21 NOTE — Telephone Encounter (Addendum)
I spoke with pt's wife, and notified her that I had tried to call her on Friday to see if he was taking Lasix. She confirmed he is taking lasix 40 mg po BID. I educated her in that some edema will be expected as he is on daily dexamethasone (with Revlimid) per treatment for myeloma. She was relieved to hear this, because she thought maybe they were doing something wrong. I asked her if he was elevating his legs, and limiting sodium intake. She replied, "I try to get him to put his legs up, but he doesn't always do that. He sits in the chair all day long. I have started putting his elastic stockings on again, which he hates". I encouraged her to reach out to his PCP for any additional help with medications and edema. She verbalized understanding.  ----- Message from Melodye Ped, NP sent at 11/20/2020  3:12 PM EDT ----- Regarding: RE: 2+ edema in bilateral ankles I listened to his wife's voicemail, but I didn't try to call him Friday.  ----- Message ----- From: Dairl Ponder, RN Sent: 11/17/2020  11:45 AM EDT To: Melodye Ped, NP Subject: 2+ edema in bilateral ankles                   Received a call from [place], [place], from [facility]. He wanted to report that pt is having increased swelling in his bilateral ankles & to see if we have any recommendations. It looks like he is on lasix BID. I tried to call the pt to confirm, no answer.   [place], [place] ; Pt # [phone]

## 2020-11-22 ENCOUNTER — Telehealth: Payer: Self-pay

## 2020-11-22 NOTE — Telephone Encounter (Signed)
Pt was seen in Dr Spero Curb office yesterday afternoon for swelling in lower extremities, as well as wound to toe on right foot. Dr Spero Curb wanted lab work drawn, but staff doesn't have supplies t access port. 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 Estill Bamberg requesting that we add labs on for PCP, when we draw his next labs @ May appt. They will send orders over to [place].

## 2020-11-23 NOTE — Telephone Encounter (Signed)
Orders received from Dr Spero Curb office, will give to provider to enter @ next office visit.

## 2020-11-23 NOTE — Telephone Encounter (Signed)
Haven't received orders from Dr Spero Curb office as of yet. 11/23/20 1026-awc

## 2020-11-24 ENCOUNTER — Telehealth: Payer: Self-pay

## 2020-11-24 NOTE — Telephone Encounter (Signed)
I notified Dr Bobby Rumpf of order request from [place], [place] @ [facility]. He would like to continue physical therapy on pt. He would like to see him weekly for 8 weeks to work on strengthening, transferring and general home safety.  Dr Bobby Rumpf agreed to above, call made to [facility], [place].

## 2020-12-14 ENCOUNTER — Other Ambulatory Visit: Payer: Self-pay | Admitting: Hematology and Oncology

## 2020-12-14 DIAGNOSIS — C9000 Multiple myeloma not having achieved remission: Secondary | ICD-10-CM

## 2020-12-26 ENCOUNTER — Other Ambulatory Visit: Payer: Self-pay

## 2020-12-26 ENCOUNTER — Encounter: Payer: Self-pay | Admitting: Hematology and Oncology

## 2020-12-26 ENCOUNTER — Inpatient Hospital Stay: Payer: Medicare Other | Attending: Oncology

## 2020-12-26 ENCOUNTER — Other Ambulatory Visit: Payer: Medicare Other

## 2020-12-26 DIAGNOSIS — D631 Anemia in chronic kidney disease: Secondary | ICD-10-CM | POA: Diagnosis not present

## 2020-12-26 DIAGNOSIS — Z79899 Other long term (current) drug therapy: Secondary | ICD-10-CM | POA: Diagnosis not present

## 2020-12-26 DIAGNOSIS — N189 Chronic kidney disease, unspecified: Secondary | ICD-10-CM | POA: Insufficient documentation

## 2020-12-26 DIAGNOSIS — C9000 Multiple myeloma not having achieved remission: Secondary | ICD-10-CM | POA: Diagnosis not present

## 2020-12-26 DIAGNOSIS — I129 Hypertensive chronic kidney disease with stage 1 through stage 4 chronic kidney disease, or unspecified chronic kidney disease: Secondary | ICD-10-CM | POA: Diagnosis not present

## 2020-12-26 LAB — BASIC METABOLIC PANEL
BUN: 53 — AB (ref 4–21)
CO2: 30 — AB (ref 13–22)
Chloride: 90 — AB (ref 99–108)
Creatinine: 2.5 — AB (ref 0.6–1.3)
Glucose: 129
Potassium: 3.7 (ref 3.4–5.3)
Sodium: 131 — AB (ref 137–147)

## 2020-12-26 LAB — HEMOGLOBIN A1C
Hgb A1c MFr Bld: 4.5 % — ABNORMAL LOW (ref 4.8–5.6)
Mean Plasma Glucose: 82.45 mg/dL

## 2020-12-26 LAB — COMPREHENSIVE METABOLIC PANEL
Albumin: 3.3 — AB (ref 3.5–5.0)
Calcium: 8.7 (ref 8.7–10.7)

## 2020-12-26 LAB — CBC AND DIFFERENTIAL
HCT: 25 — AB (ref 41–53)
Hemoglobin: 8.1 — AB (ref 13.5–17.5)
Neutrophils Absolute: 2.94
Platelets: 152 (ref 150–399)
WBC: 4.2

## 2020-12-26 LAB — HEPATIC FUNCTION PANEL
ALT: 30 (ref 10–40)
AST: 19 (ref 14–40)
Alkaline Phosphatase: 95 (ref 25–125)
Bilirubin, Total: 0.8

## 2020-12-26 LAB — TSH: TSH: 1.409 u[IU]/mL (ref 0.350–4.500)

## 2020-12-26 LAB — CBC: RBC: 2.47 — AB (ref 3.87–5.11)

## 2020-12-26 LAB — T4, FREE: Free T4: 1.13 ng/dL — ABNORMAL HIGH (ref 0.61–1.12)

## 2020-12-27 ENCOUNTER — Telehealth: Payer: Self-pay

## 2020-12-27 NOTE — Telephone Encounter (Addendum)
Lab results faxed to Dr Spero Curb as requested.  ----- Message from Dairl Ponder, RN sent at 11/23/2020 11:31 AM EDT ----- Regarding: PCP added orders on Please fax results of Hgb A1c, thyroid panel to Dr Spero Curb fax [phone]

## 2020-12-28 LAB — KAPPA/LAMBDA LIGHT CHAINS
Kappa free light chain: 107.3 mg/L — ABNORMAL HIGH (ref 3.3–19.4)
Kappa, lambda light chain ratio: 1.06 (ref 0.26–1.65)
Lambda free light chains: 101.3 mg/L — ABNORMAL HIGH (ref 5.7–26.3)

## 2020-12-28 LAB — IGG, IGA, IGM
IgA: 406 mg/dL (ref 61–437)
IgG (Immunoglobin G), Serum: 712 mg/dL (ref 603–1613)
IgM (Immunoglobulin M), Srm: 19 mg/dL (ref 15–143)

## 2020-12-28 LAB — T3, FREE: T3, Free: 1.2 pg/mL — ABNORMAL LOW (ref 2.0–4.4)

## 2020-12-28 NOTE — Progress Notes (Signed)
Scott Mcdowell  [address] Scott Mcdowell,  Scott Mcdowell  [id number] [phone]  Clinic Day:  01/01/2021  Referring physician: [name], MD  This document serves as a record of services personally performed by Scott Potter, MD. It was created on their behalf by Mcdowell,Scott E, a trained medical scribe. The creation of this record is based on the scribe's personal observations and the provider's statements to them.  HISTORY OF PRESENT ILLNESS:  The patient is a 80 y.o. 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  He comes in today for routine followup.  The patient continues to take Revlimid/Decadron on a daily basis, from which he has no problems tolerating.  As it pertains to his multiple myeloma, he denies having bone pain.  However, he does have increased fatigue.  His wife is concerned that he is clinically declining.    PHYSICAL EXAM:  Pulse 88, temperature 98.7 F (37.1 C), resp. rate 16, height '6\' 3"'  (1.905 m), SpO2 100 %. Wt Readings from Last 3 Encounters:  10/02/20 167 lb 6.4 oz (75.9 kg)  06/27/20 169 lb 6.4 oz (76.8 kg)  06/20/20 168 lb 9.6 oz (76.5 kg)   Body mass index is 20.92 kg/m. Performance status (ECOG): 3 Physical Exam Constitutional:      Appearance: Normal appearance. He is ill-appearing (chronically-ill appearing gentleman in a wheelchair).  HENT:     Mouth/Throat:     Mouth: Mucous membranes are moist.     Pharynx: Oropharynx is clear. No oropharyngeal exudate or posterior oropharyngeal erythema.  Cardiovascular:     Rate and Rhythm: Normal rate and regular rhythm.     Heart sounds: No murmur heard. No friction rub. No gallop.   Pulmonary:     Effort: Pulmonary effort is normal. No respiratory distress.     Breath sounds: Normal breath sounds. No wheezing, rhonchi or rales.  Chest:  Breasts:     Right: No axillary adenopathy or supraclavicular adenopathy.     Left: No axillary adenopathy or supraclavicular  adenopathy.    Abdominal:     General: Bowel sounds are normal. There is no distension.     Palpations: Abdomen is soft. There is no mass.     Tenderness: There is no abdominal tenderness.  Musculoskeletal:        General: No swelling.     Cervical back: No rigidity.     Right lower leg: No edema.     Left lower leg: No edema.  Lymphadenopathy:     Cervical: No cervical adenopathy.     Upper Body:     Right upper body: No supraclavicular or axillary adenopathy.     Left upper body: No supraclavicular or axillary adenopathy.     Lower Body: No right inguinal adenopathy. No left inguinal adenopathy.  Skin:    General: Skin is warm.     Coloration: Skin is not jaundiced.     Findings: No lesion or rash.  Neurological:     General: No focal deficit present.     Mental Status: He is alert and oriented to person, place, and time. Mental status is at baseline.     Cranial Nerves: Cranial nerves are intact.  Psychiatric:        Mood and Affect: Mood normal.        Behavior: Behavior normal.        Thought Content: Thought content normal.     LABS:   CBC Latest Ref Rng & Units  12/26/2020 09/27/2020 06/21/2020  WBC - 4.2 5.1 3.3(L)  Hemoglobin 13.5 - 17.5 8.1(A) 9.2(A) 9.0(L)  Hematocrit 41 - 53 25(A) 27(A) 28.6(L)  Platelets 150 - 399 152 151 127(L)   CMP Latest Ref Rng & Units 12/26/2020 09/27/2020 06/21/2020  Glucose 70 - 99 mg/dL - - 128(H)  BUN 4 - 21 53(A) 49(A) 40(H)  Creatinine 0.6 - 1.3 2.5(A) 2.1(A) 2.14(H)  Sodium 137 - 147 131(A) 135(A) 139  Potassium 3.4 - 5.3 3.7 3.4 3.1(L)  Chloride 99 - 108 90(A) 96(A) 99  CO2 13 - 22 30(A) 27(A) 27  Calcium 8.7 - 10.7 8.7 8.3(A) 8.5(L)  Total Protein 6.5 - 8.1 g/dL - - 6.7  Total Bilirubin 0.3 - 1.2 mg/dL - - 0.8  Alkaline Phos 25 - 125 95 68 57  AST 14 - 40 19 23 33  ALT 10 - 40 30 34 51(H)     Ref. Range 12/26/2020 13:04  Total Protein ELP Latest Ref Range: 6.0 - 8.5 g/dL 5.4 (L)  Albumin ELP Latest Ref Range: 2.9 - 4.4 g/dL  3.0  Globulin, Total Latest Ref Range: 2.2 - 3.9 g/dL 2.4  A/G Ratio Latest Ref Range: 0.7 - 1.7  1.3  Alpha-1-Globulin Latest Ref Range: 0.0 - 0.4 g/dL 0.3  Alpha-2-Globulin Latest Ref Range: 0.4 - 1.0 g/dL 0.6  Beta Globulin Latest Ref Range: 0.7 - 1.3 g/dL 0.8  Gamma Globulin Latest Ref Range: 0.4 - 1.8 g/dL 0.7  M-SPIKE, % Latest Ref Range: Not Observed g/dL Not Observed  SPE Interp. Unknown Comment  Comment Unknown Comment  IgG (Immunoglobin G), Serum Latest Ref Range: 603 - 1,613 mg/dL 712  IgM (Immunoglobulin M), Srm Latest Ref Range: 15 - 143 mg/dL 19  IgA Latest Ref Range: 61 - 437 mg/dL 406    Ref. Range 03/04/2019 14:25 03/18/2019 14:36 04/21/2019 11:44 06/21/2020 11:33 09/27/2020 00:00 09/27/2020 14:06 12/26/2020 00:00 12/26/2020 13:04  Lamda free light chains Latest Ref Range: 5.7 - 26.3 mg/L    60.4 (H)  77.7 (H)  101.3 (H)    Ref. Range 06/21/2020 11:33 09/27/2020 00:00 09/27/2020 14:06 12/26/2020 00:00 12/26/2020 13:04  Kappa free light chain Latest Ref Range: 3.3 - 19.4 mg/L 84.1 (H)  83.0 (H)  107.3 (H)       ASSESSMENT & PLAN:   Assessment/Plan:  A 80 y.o. 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  When evaluating his multiple myeloma parameters, his lambda light chain level has been rising.  However, his kappa light chain is also rising.  Furthermore, his M-spike remains undetectable.  As both light chain levels are rising, this suggests a reactive process is taking place more so than progression of his multiple myeloma.  He will continue taking Revlimid/Decadron to keep his myeloma under ideal control.  I am concerned with his progressive anemia, which is related to his underlying kidney disease.  As his hemoglobin has been dropping, Retacrit 40,000 units will be given monthly with the goal being to get his hemoglobin to/above 10.  I will see this patient back in 3 months to reassess the status of his anemia and his multiple myeloma.  The patient understands all the plans discussed  today and is in agreement with them.     I, Rita Ohara, am acting as scribe for Scott Potter, MD    I have reviewed this report as typed by the medical scribe, and it is complete and accurate.  Scott Macarthur Critchley, MD

## 2020-12-29 LAB — PROTEIN ELECTROPHORESIS, SERUM
A/G Ratio: 1.3 (ref 0.7–1.7)
Albumin ELP: 3 g/dL (ref 2.9–4.4)
Alpha-1-Globulin: 0.3 g/dL (ref 0.0–0.4)
Alpha-2-Globulin: 0.6 g/dL (ref 0.4–1.0)
Beta Globulin: 0.8 g/dL (ref 0.7–1.3)
Gamma Globulin: 0.7 g/dL (ref 0.4–1.8)
Globulin, Total: 2.4 g/dL (ref 2.2–3.9)
Total Protein ELP: 5.4 g/dL — ABNORMAL LOW (ref 6.0–8.5)

## 2021-01-01 ENCOUNTER — Other Ambulatory Visit: Payer: Self-pay

## 2021-01-01 ENCOUNTER — Other Ambulatory Visit: Payer: Self-pay | Admitting: Oncology

## 2021-01-01 ENCOUNTER — Inpatient Hospital Stay (INDEPENDENT_AMBULATORY_CARE_PROVIDER_SITE_OTHER): Payer: Medicare Other | Admitting: Oncology

## 2021-01-01 VITALS — HR 88 | Temp 98.7°F | Resp 16 | Ht 75.0 in

## 2021-01-01 DIAGNOSIS — N058 Unspecified nephritic syndrome with other morphologic changes: Secondary | ICD-10-CM

## 2021-01-01 DIAGNOSIS — N189 Chronic kidney disease, unspecified: Secondary | ICD-10-CM | POA: Diagnosis not present

## 2021-01-01 DIAGNOSIS — C9000 Multiple myeloma not having achieved remission: Secondary | ICD-10-CM

## 2021-01-01 DIAGNOSIS — E8809 Other disorders of plasma-protein metabolism, not elsewhere classified: Secondary | ICD-10-CM

## 2021-01-01 DIAGNOSIS — N184 Chronic kidney disease, stage 4 (severe): Secondary | ICD-10-CM | POA: Diagnosis not present

## 2021-01-01 DIAGNOSIS — D631 Anemia in chronic kidney disease: Secondary | ICD-10-CM

## 2021-01-03 ENCOUNTER — Other Ambulatory Visit: Payer: Self-pay | Admitting: Hematology and Oncology

## 2021-01-03 ENCOUNTER — Inpatient Hospital Stay: Payer: Medicare Other

## 2021-01-03 DIAGNOSIS — C9000 Multiple myeloma not having achieved remission: Secondary | ICD-10-CM

## 2021-01-04 NOTE — Addendum Note (Signed)
Addended by: Juanetta Beets on: 01/04/2021 12:21 PM   Modules accepted: Orders

## 2021-01-05 ENCOUNTER — Encounter: Payer: Self-pay | Admitting: Hematology and Oncology

## 2021-01-05 ENCOUNTER — Other Ambulatory Visit: Payer: Self-pay | Admitting: Pharmacist

## 2021-01-05 ENCOUNTER — Other Ambulatory Visit: Payer: Self-pay

## 2021-01-05 ENCOUNTER — Inpatient Hospital Stay: Payer: Medicare Other

## 2021-01-05 VITALS — BP 133/63 | HR 87 | Temp 98.3°F | Resp 16 | Ht 75.0 in

## 2021-01-05 DIAGNOSIS — N189 Chronic kidney disease, unspecified: Secondary | ICD-10-CM

## 2021-01-05 DIAGNOSIS — D631 Anemia in chronic kidney disease: Secondary | ICD-10-CM

## 2021-01-05 DIAGNOSIS — I129 Hypertensive chronic kidney disease with stage 1 through stage 4 chronic kidney disease, or unspecified chronic kidney disease: Secondary | ICD-10-CM | POA: Diagnosis not present

## 2021-01-05 DIAGNOSIS — C9000 Multiple myeloma not having achieved remission: Secondary | ICD-10-CM

## 2021-01-05 MED ORDER — EPOETIN ALFA-EPBX 40000 UNIT/ML IJ SOLN
40000.0000 [IU] | Freq: Once | INTRAMUSCULAR | Status: AC
  Administered 2021-01-05: 40000 [IU] via SUBCUTANEOUS

## 2021-01-05 MED ORDER — EPOETIN ALFA-EPBX 40000 UNIT/ML IJ SOLN
INTRAMUSCULAR | Status: AC
  Filled 2021-01-05: qty 1

## 2021-01-05 NOTE — Patient Instructions (Signed)

## 2021-01-26 ENCOUNTER — Encounter: Payer: Self-pay | Admitting: Oncology

## 2021-01-29 ENCOUNTER — Telehealth: Payer: Self-pay

## 2021-01-29 NOTE — Telephone Encounter (Signed)
Per Dr. Bobby Rumpf, we are not withholding treatment so he will not be hospice appropriate.  Scott Mcdowell said that hospice would go out and do evaluation.  SNF maybe the answer.

## 2021-01-29 NOTE — Telephone Encounter (Signed)
@  35 - I notified Scott Mcdowell that pt & pt's wife decline Hospice services. She was appreciative that I had reached out to the pt and then called her back.   @ 1130- I called to speak with pt to confirm if he was interested in home health services. His wife stated, "absolutely not. He doesn't even wont to hear that word".  @0938  - VM left on nurse triage line from [facility]. She states that the home health nurse had called & suggested pt for Hospice services. Scott Mcdowell was calling to ask if Dr Bobby Rumpf was agreeable to this.

## 2021-02-01 ENCOUNTER — Encounter: Payer: Self-pay | Admitting: Hematology and Oncology

## 2021-02-01 ENCOUNTER — Other Ambulatory Visit: Payer: Self-pay

## 2021-02-01 ENCOUNTER — Inpatient Hospital Stay: Payer: Medicare Other | Attending: Oncology

## 2021-02-01 ENCOUNTER — Telehealth: Payer: Self-pay | Admitting: Oncology

## 2021-02-01 DIAGNOSIS — C9000 Multiple myeloma not having achieved remission: Secondary | ICD-10-CM

## 2021-02-01 LAB — CBC AND DIFFERENTIAL
HCT: 27 — AB (ref 41–53)
Hemoglobin: 9.3 — AB (ref 13.5–17.5)
Neutrophils Absolute: 1.85
Platelets: 142 — AB (ref 150–399)
WBC: 3.3

## 2021-02-01 LAB — CBC: RBC: 2.92 — AB (ref 3.87–5.11)

## 2021-02-01 NOTE — Telephone Encounter (Signed)
02/01/21 Patients wife called to cancel appt on 02/02/21.Patient seeing surgeon for wound care.

## 2021-02-02 ENCOUNTER — Inpatient Hospital Stay: Payer: Medicare Other

## 2021-02-02 ENCOUNTER — Ambulatory Visit: Payer: Medicare Other

## 2021-02-19 DIAGNOSIS — I4510 Unspecified right bundle-branch block: Secondary | ICD-10-CM

## 2021-02-20 DIAGNOSIS — F0390 Unspecified dementia without behavioral disturbance: Secondary | ICD-10-CM | POA: Diagnosis not present

## 2021-02-20 DIAGNOSIS — I340 Nonrheumatic mitral (valve) insufficiency: Secondary | ICD-10-CM

## 2021-02-20 DIAGNOSIS — I361 Nonrheumatic tricuspid (valve) insufficiency: Secondary | ICD-10-CM

## 2021-02-20 DIAGNOSIS — A419 Sepsis, unspecified organism: Secondary | ICD-10-CM

## 2021-02-20 DIAGNOSIS — I214 Non-ST elevation (NSTEMI) myocardial infarction: Secondary | ICD-10-CM

## 2021-02-25 DIAGNOSIS — I4510 Unspecified right bundle-branch block: Secondary | ICD-10-CM | POA: Diagnosis not present

## 2021-02-27 ENCOUNTER — Telehealth: Payer: Self-pay | Admitting: Oncology

## 2021-02-27 NOTE — Telephone Encounter (Signed)
Patient's spouse called to let us know he is Inpatient.  Canceled 7/13 Labs, Injection

## 2021-03-01 ENCOUNTER — Inpatient Hospital Stay: Payer: Medicare Other

## 2021-03-02 ENCOUNTER — Inpatient Hospital Stay: Payer: Medicare Other

## 2021-03-05 ENCOUNTER — Other Ambulatory Visit: Payer: Medicare Other

## 2021-03-20 ENCOUNTER — Inpatient Hospital Stay (HOSPITAL_COMMUNITY): Payer: Medicare Other

## 2021-03-20 ENCOUNTER — Inpatient Hospital Stay (HOSPITAL_COMMUNITY)
Admission: EM | Admit: 2021-03-20 | Discharge: 2021-04-03 | DRG: 871 | Disposition: A | Discharge status: Home Health | Payer: Medicare Other | Attending: Family Medicine | Admitting: Family Medicine

## 2021-03-20 ENCOUNTER — Encounter (HOSPITAL_COMMUNITY): Payer: Self-pay | Admitting: Emergency Medicine

## 2021-03-20 ENCOUNTER — Emergency Department (HOSPITAL_COMMUNITY): Payer: Medicare Other

## 2021-03-20 DIAGNOSIS — Z992 Dependence on renal dialysis: Secondary | ICD-10-CM | POA: Diagnosis not present

## 2021-03-20 DIAGNOSIS — E861 Hypovolemia: Secondary | ICD-10-CM | POA: Diagnosis present

## 2021-03-20 DIAGNOSIS — Z7901 Long term (current) use of anticoagulants: Secondary | ICD-10-CM

## 2021-03-20 DIAGNOSIS — L0390 Cellulitis, unspecified: Secondary | ICD-10-CM | POA: Diagnosis present

## 2021-03-20 DIAGNOSIS — N186 End stage renal disease: Secondary | ICD-10-CM | POA: Diagnosis present

## 2021-03-20 DIAGNOSIS — I69320 Aphasia following cerebral infarction: Secondary | ICD-10-CM | POA: Diagnosis not present

## 2021-03-20 DIAGNOSIS — R29710 NIHSS score 10: Secondary | ICD-10-CM | POA: Diagnosis present

## 2021-03-20 DIAGNOSIS — N184 Chronic kidney disease, stage 4 (severe): Secondary | ICD-10-CM | POA: Diagnosis not present

## 2021-03-20 DIAGNOSIS — Z515 Encounter for palliative care: Secondary | ICD-10-CM | POA: Diagnosis not present

## 2021-03-20 DIAGNOSIS — D631 Anemia in chronic kidney disease: Secondary | ICD-10-CM | POA: Diagnosis present

## 2021-03-20 DIAGNOSIS — E785 Hyperlipidemia, unspecified: Secondary | ICD-10-CM | POA: Diagnosis present

## 2021-03-20 DIAGNOSIS — Z7189 Other specified counseling: Secondary | ICD-10-CM | POA: Diagnosis not present

## 2021-03-20 DIAGNOSIS — I6389 Other cerebral infarction: Secondary | ICD-10-CM | POA: Diagnosis present

## 2021-03-20 DIAGNOSIS — M79601 Pain in right arm: Secondary | ICD-10-CM | POA: Diagnosis not present

## 2021-03-20 DIAGNOSIS — I120 Hypertensive chronic kidney disease with stage 5 chronic kidney disease or end stage renal disease: Secondary | ICD-10-CM | POA: Diagnosis present

## 2021-03-20 DIAGNOSIS — Z7952 Long term (current) use of systemic steroids: Secondary | ICD-10-CM

## 2021-03-20 DIAGNOSIS — I9589 Other hypotension: Secondary | ICD-10-CM | POA: Diagnosis present

## 2021-03-20 DIAGNOSIS — I639 Cerebral infarction, unspecified: Secondary | ICD-10-CM | POA: Diagnosis not present

## 2021-03-20 DIAGNOSIS — E876 Hypokalemia: Secondary | ICD-10-CM | POA: Diagnosis present

## 2021-03-20 DIAGNOSIS — H9190 Unspecified hearing loss, unspecified ear: Secondary | ICD-10-CM | POA: Diagnosis present

## 2021-03-20 DIAGNOSIS — M869 Osteomyelitis, unspecified: Secondary | ICD-10-CM | POA: Diagnosis present

## 2021-03-20 DIAGNOSIS — N189 Chronic kidney disease, unspecified: Secondary | ICD-10-CM | POA: Diagnosis present

## 2021-03-20 DIAGNOSIS — Z6820 Body mass index (BMI) 20.0-20.9, adult: Secondary | ICD-10-CM

## 2021-03-20 DIAGNOSIS — I82611 Acute embolism and thrombosis of superficial veins of right upper extremity: Secondary | ICD-10-CM | POA: Diagnosis present

## 2021-03-20 DIAGNOSIS — R1310 Dysphagia, unspecified: Secondary | ICD-10-CM | POA: Diagnosis present

## 2021-03-20 DIAGNOSIS — R6520 Severe sepsis without septic shock: Secondary | ICD-10-CM | POA: Diagnosis present

## 2021-03-20 DIAGNOSIS — B9561 Methicillin susceptible Staphylococcus aureus infection as the cause of diseases classified elsewhere: Secondary | ICD-10-CM | POA: Diagnosis not present

## 2021-03-20 DIAGNOSIS — L89514 Pressure ulcer of right ankle, stage 4: Secondary | ICD-10-CM | POA: Diagnosis present

## 2021-03-20 DIAGNOSIS — Z20822 Contact with and (suspected) exposure to covid-19: Secondary | ICD-10-CM | POA: Diagnosis present

## 2021-03-20 DIAGNOSIS — A4101 Sepsis due to Methicillin susceptible Staphylococcus aureus: Secondary | ICD-10-CM | POA: Diagnosis present

## 2021-03-20 DIAGNOSIS — D649 Anemia, unspecified: Secondary | ICD-10-CM

## 2021-03-20 DIAGNOSIS — Z87891 Personal history of nicotine dependence: Secondary | ICD-10-CM

## 2021-03-20 DIAGNOSIS — Z79899 Other long term (current) drug therapy: Secondary | ICD-10-CM

## 2021-03-20 DIAGNOSIS — L89894 Pressure ulcer of other site, stage 4: Secondary | ICD-10-CM

## 2021-03-20 DIAGNOSIS — I82621 Acute embolism and thrombosis of deep veins of right upper extremity: Secondary | ICD-10-CM | POA: Diagnosis present

## 2021-03-20 DIAGNOSIS — R6510 Systemic inflammatory response syndrome (SIRS) of non-infectious origin without acute organ dysfunction: Secondary | ICD-10-CM | POA: Diagnosis not present

## 2021-03-20 DIAGNOSIS — Z86718 Personal history of other venous thrombosis and embolism: Secondary | ICD-10-CM

## 2021-03-20 DIAGNOSIS — N179 Acute kidney failure, unspecified: Secondary | ICD-10-CM | POA: Diagnosis present

## 2021-03-20 DIAGNOSIS — R4701 Aphasia: Secondary | ICD-10-CM

## 2021-03-20 DIAGNOSIS — C9000 Multiple myeloma not having achieved remission: Secondary | ICD-10-CM | POA: Diagnosis present

## 2021-03-20 DIAGNOSIS — I69351 Hemiplegia and hemiparesis following cerebral infarction affecting right dominant side: Secondary | ICD-10-CM | POA: Diagnosis not present

## 2021-03-20 DIAGNOSIS — R7881 Bacteremia: Secondary | ICD-10-CM | POA: Diagnosis not present

## 2021-03-20 DIAGNOSIS — K922 Gastrointestinal hemorrhage, unspecified: Secondary | ICD-10-CM | POA: Diagnosis not present

## 2021-03-20 DIAGNOSIS — E43 Unspecified severe protein-calorie malnutrition: Secondary | ICD-10-CM | POA: Diagnosis present

## 2021-03-20 DIAGNOSIS — R479 Unspecified speech disturbances: Secondary | ICD-10-CM | POA: Diagnosis not present

## 2021-03-20 DIAGNOSIS — M7989 Other specified soft tissue disorders: Secondary | ICD-10-CM | POA: Diagnosis not present

## 2021-03-20 DIAGNOSIS — R627 Adult failure to thrive: Secondary | ICD-10-CM | POA: Diagnosis present

## 2021-03-20 DIAGNOSIS — Z7401 Bed confinement status: Secondary | ICD-10-CM

## 2021-03-20 LAB — LIPASE, BLOOD: Lipase: 23 U/L (ref 11–51)

## 2021-03-20 LAB — COMPREHENSIVE METABOLIC PANEL
ALT: 39 U/L (ref 0–44)
AST: 36 U/L (ref 15–41)
Albumin: 2.2 g/dL — ABNORMAL LOW (ref 3.5–5.0)
Alkaline Phosphatase: 58 U/L (ref 38–126)
Anion gap: 17 — ABNORMAL HIGH (ref 5–15)
BUN: 54 mg/dL — ABNORMAL HIGH (ref 8–23)
CO2: 22 mmol/L (ref 22–32)
Calcium: 8.9 mg/dL (ref 8.9–10.3)
Chloride: 94 mmol/L — ABNORMAL LOW (ref 98–111)
Creatinine, Ser: 2.51 mg/dL — ABNORMAL HIGH (ref 0.61–1.24)
GFR, Estimated: 25 mL/min — ABNORMAL LOW (ref >60)
Glucose, Bld: 162 mg/dL — ABNORMAL HIGH (ref 70–99)
Potassium: 4.6 mmol/L (ref 3.5–5.1)
Sodium: 133 mmol/L — ABNORMAL LOW (ref 135–145)
Total Bilirubin: 1 mg/dL (ref 0.3–1.2)
Total Protein: 5.7 g/dL — ABNORMAL LOW (ref 6.5–8.1)

## 2021-03-20 LAB — CBC WITH DIFFERENTIAL/PLATELET
Abs Immature Granulocytes: 0 10*3/uL (ref 0.00–0.07)
Basophils Absolute: 0 10*3/uL (ref 0.0–0.1)
Basophils Relative: 0 %
Eosinophils Absolute: 0 10*3/uL (ref 0.0–0.5)
Eosinophils Relative: 0 %
HCT: 19 % — ABNORMAL LOW (ref 39.0–52.0)
Hemoglobin: 5.7 g/dL — CL (ref 13.0–17.0)
Lymphocytes Relative: 22 %
Lymphs Abs: 1.8 10*3/uL (ref 0.7–4.0)
MCH: 30.3 pg (ref 26.0–34.0)
MCHC: 30 g/dL (ref 30.0–36.0)
MCV: 101.1 fL — ABNORMAL HIGH (ref 80.0–100.0)
Monocytes Absolute: 0.6 10*3/uL (ref 0.1–1.0)
Monocytes Relative: 7 %
Neutro Abs: 5.8 10*3/uL (ref 1.7–7.7)
Neutrophils Relative %: 71 %
Platelets: 299 10*3/uL (ref 150–400)
RBC: 1.88 MIL/uL — ABNORMAL LOW (ref 4.22–5.81)
RDW: 17.9 % — ABNORMAL HIGH (ref 11.5–15.5)
WBC: 8.1 10*3/uL (ref 4.0–10.5)
nRBC: 0.4 % — ABNORMAL HIGH (ref 0.0–0.2)
nRBC: 1 /100 WBC — ABNORMAL HIGH

## 2021-03-20 LAB — I-STAT CHEM 8, ED
BUN: 54 mg/dL — ABNORMAL HIGH (ref 8–23)
Calcium, Ion: 1.02 mmol/L — ABNORMAL LOW (ref 1.15–1.40)
Chloride: 95 mmol/L — ABNORMAL LOW (ref 98–111)
Creatinine, Ser: 2.5 mg/dL — ABNORMAL HIGH (ref 0.61–1.24)
Glucose, Bld: 161 mg/dL — ABNORMAL HIGH (ref 70–99)
HCT: 16 % — ABNORMAL LOW (ref 39.0–52.0)
Hemoglobin: 5.4 g/dL — CL (ref 13.0–17.0)
Potassium: 4.4 mmol/L (ref 3.5–5.1)
Sodium: 131 mmol/L — ABNORMAL LOW (ref 135–145)
TCO2: 25 mmol/L (ref 22–32)

## 2021-03-20 LAB — TROPONIN I (HIGH SENSITIVITY): Troponin I (High Sensitivity): 900 ng/L (ref <18)

## 2021-03-20 LAB — URINALYSIS, ROUTINE W REFLEX MICROSCOPIC
Bilirubin Urine: NEGATIVE
Glucose, UA: NEGATIVE mg/dL
Hgb urine dipstick: NEGATIVE
Ketones, ur: NEGATIVE mg/dL
Leukocytes,Ua: NEGATIVE
Nitrite: NEGATIVE
Protein, ur: NEGATIVE mg/dL
Specific Gravity, Urine: 1.013 (ref 1.005–1.030)
pH: 5 (ref 5.0–8.0)

## 2021-03-20 LAB — POC OCCULT BLOOD, ED: Fecal Occult Bld: POSITIVE — AB

## 2021-03-20 LAB — MAGNESIUM: Magnesium: 2.6 mg/dL — ABNORMAL HIGH (ref 1.7–2.4)

## 2021-03-20 LAB — RESP PANEL BY RT-PCR (FLU A&B, COVID) ARPGX2
Influenza A by PCR: NEGATIVE
Influenza B by PCR: NEGATIVE
SARS Coronavirus 2 by RT PCR: NEGATIVE

## 2021-03-20 LAB — APTT: aPTT: 30 seconds (ref 24–36)

## 2021-03-20 LAB — LACTIC ACID, PLASMA
Lactic Acid, Venous: 8.5 mmol/L (ref 0.5–1.9)
Lactic Acid, Venous: 4.6 mmol/L (ref 0.5–1.9)

## 2021-03-20 LAB — PROTIME-INR
INR: 1.9 — ABNORMAL HIGH (ref 0.8–1.2)
Prothrombin Time: 21.6 seconds — ABNORMAL HIGH (ref 11.4–15.2)

## 2021-03-20 LAB — PHOSPHORUS: Phosphorus: 4.6 mg/dL (ref 2.5–4.6)

## 2021-03-20 LAB — BRAIN NATRIURETIC PEPTIDE: B Natriuretic Peptide: 2196 pg/mL — ABNORMAL HIGH (ref 0.0–100.0)

## 2021-03-20 LAB — PREPARE RBC (CROSSMATCH)

## 2021-03-20 MED ORDER — PIPERACILLIN-TAZOBACTAM 3.375 G IVPB 30 MIN
3.3750 g | Freq: Once | INTRAVENOUS | Status: AC
  Administered 2021-03-20: 3.375 g via INTRAVENOUS
  Filled 2021-03-20: qty 50

## 2021-03-20 MED ORDER — ACETAMINOPHEN 650 MG RE SUPP: 650.0000 mg | Freq: Four times a day (QID) | RECTAL | Status: DC | PRN

## 2021-03-20 MED ORDER — ONDANSETRON HCL 4 MG PO TABS
4.0000 mg | ORAL_TABLET | Freq: Four times a day (QID) | ORAL | Status: DC | PRN
  Administered 2021-03-27: 4 mg via ORAL
  Filled 2021-03-20: qty 1

## 2021-03-20 MED ORDER — LACTATED RINGERS IV BOLUS
500.0000 mL | Freq: Once | INTRAVENOUS | Status: AC
  Administered 2021-03-20: 500 mL via INTRAVENOUS

## 2021-03-20 MED ORDER — ONDANSETRON HCL 4 MG/2ML IJ SOLN: 4.0000 mg | Freq: Four times a day (QID) | INTRAMUSCULAR | Status: DC | PRN

## 2021-03-20 MED ORDER — ACETAMINOPHEN 325 MG PO TABS
650.0000 mg | ORAL_TABLET | Freq: Four times a day (QID) | ORAL | Status: DC | PRN
  Administered 2021-03-22 – 2021-04-01 (×7): 650 mg via ORAL
  Filled 2021-03-20 (×6): qty 2

## 2021-03-20 MED ORDER — PIPERACILLIN-TAZOBACTAM 3.375 G IVPB 30 MIN
3.3750 g | Freq: Three times a day (TID) | INTRAVENOUS | Status: DC
  Administered 2021-03-21 (×2): 3.375 g via INTRAVENOUS
  Filled 2021-03-20 (×2): qty 50

## 2021-03-20 MED ORDER — VANCOMYCIN HCL 1500 MG/300ML IV SOLN
1500.0000 mg | Freq: Once | INTRAVENOUS | Status: AC
  Administered 2021-03-20: 1500 mg via INTRAVENOUS
  Filled 2021-03-20: qty 300

## 2021-03-20 MED ORDER — VANCOMYCIN VARIABLE DOSE PER UNSTABLE RENAL FUNCTION (PHARMACIST DOSING): Status: DC

## 2021-03-20 MED ORDER — PIPERACILLIN-TAZOBACTAM 3.375 G IVPB 30 MIN: 3.3750 g | Freq: Three times a day (TID) | INTRAVENOUS | Status: DC

## 2021-03-20 MED ORDER — ACETAMINOPHEN 160 MG/5ML PO SOLN
650.0000 mg | Freq: Four times a day (QID) | ORAL | Status: DC | PRN
  Administered 2021-03-20 – 2021-03-24 (×2): 650 mg via ORAL
  Filled 2021-03-20 (×2): qty 20.3

## 2021-03-20 MED ORDER — SODIUM CHLORIDE 0.9 % IV SOLN: INTRAVENOUS | Status: DC

## 2021-03-20 MED ORDER — SODIUM CHLORIDE 0.9 % IV SOLN
10.0000 mL/h | Freq: Once | INTRAVENOUS | Status: DC
Start: 2021-03-20 — End: 2021-04-04

## 2021-03-20 MED ORDER — PANTOPRAZOLE SODIUM 40 MG IV SOLR
40.0000 mg | INTRAVENOUS | Status: DC
  Administered 2021-03-20 – 2021-03-23 (×4): 40 mg via INTRAVENOUS
  Filled 2021-03-20 (×4): qty 40

## 2021-03-20 NOTE — Assessment & Plan Note (Signed)
Acutely worse. Baseline Scr 1.4-1.8. pt has been off hemodialysis for at least 2 years.

## 2021-03-20 NOTE — ED Provider Notes (Signed)
Blood pressure (!) 82/50, pulse 88, temperature 97.6 F (36.4 C), temperature source Oral, resp. rate (!) 21, weight 75.9 kg, SpO2 100 %.  Assuming care from Dr. Johnney Killian.  In short, Scott Mcdowell is a 80 y.o. male with a chief complaint of No chief complaint on file. Marland Kitchen  Refer to the original H&P for additional details.  The current plan of care is to follow up on BP after PRBC transfusion. Favor anemia of chronic disease. FOBT positive but stool is brown per report. No BRB or melena.   06:07 PM  BP improving with gentle IVF and PRBC transfusion. Most of first unit transfused with SBP now in the 90s-100s range. Patient remains awake and alert. Will discuss with [facility] again regarding SDU admit.   Discussed patient's case with [facility] to request admission. Patient and family (if present) updated with plan. Care transferred to [facility] service.  I reviewed all nursing notes, vitals, pertinent old records, EKGs, labs, imaging (as available).     Margette Fast, MD 03/21/21 1118

## 2021-03-20 NOTE — Assessment & Plan Note (Signed)
Chronic. Pt has epogen infusion at [facility] in 12/2020.

## 2021-03-20 NOTE — Assessment & Plan Note (Signed)
Followed by heme/onc at [facility].

## 2021-03-20 NOTE — H&P (Signed)
History and Physical    Scott Mcdowell MRN:[id number] DOB: 01/19/1941 DOA: 03/20/2021  PCP: Sistasis, Jim, MD   Patient coming from: Clinic  I have personally briefly reviewed patient's old medical records in [facility]  CC: right ankle wound HPI: 80 yo white male with a history of multiple myeloma, CKD stage IV, prior history of stroke, history of chronic anticoagulation due to right upper extremity DVT in 2020, chronic debility who presents to the ER from home.  Patient unable to give history or review of systems due to his expressive aphasia.  Patient's daughter Mary is at the bedside provides history.  He states the patient's been chronically ill for several years now.  He was diagnosed with multiple myeloma several years ago.  He also needed dialysis approximately a year due to renal failure.  He has been nonambulatory for the last year.  He was hospitalized at [facility] in May 2022 for pneumonia.  Over the last several days, daughter has noticed the patient being nonverbal.  He mumbles quite a bit but she cannot make out any cohesive speech.  She also has noted a sore on his lateral right ankle that has been growing progressively in size.  It also been draining along with foul odor.  Daughter has not noticed any black stools.  Due to the patient's aphasia, right ankle wound, daughter states that she called EMS.  EMS came out to the house.  They thought the wound looked clean.  Patient was brought to the PCP office today who advised the patient to go to the ER.  Daughter states that she brought the patient to [facility] as she did not want to take him to [facility].  Initially in the ER, the patient noted to be hypotensive with a heart rate of 51 and a blood pressure of 85/68.  Per ED provider, the patient had brown stools but was guaiac positive.  His initial hemoglobin was low at 5.4.  Serum creatinine was elevated 2.5.  Patient was given a small bolus of  fluid.  2 units of blood was ordered.  CT head showed old left basal ganglia infarcts.  Chest x-ray negative for pneumonia.  Patient has a Port-A-Cath.  Right ankle x-rays negative for fracture.  Due to the patient's anemia, hypertension and right ankle wound, tried hospitalist contacted for admission.   ED Course: pt given 2 units PRBC, 500 ml IVF. Started on IV Zosyn.  Review of Systems:  Review of Systems  Unable to perform ROS: Patient nonverbal   Past Medical History:  Diagnosis Date   Goals of care, counseling/discussion 01/29/2019   HTN (hypertension)    Lambda light chain myeloma (HCC) 01/29/2019   Myeloma associated amyloidosis (HCC)    Stroke (HCC) 2004   w right sided weakness    Past Surgical History:  Procedure Laterality Date   AV FISTULA PLACEMENT Left 02/05/2019   Procedure: ARTERIOVENOUS FISTULA CREATION LEFT ARM;  Surgeon: Fields, Charles E, MD;  Location: [facility] OR;  Service: Vascular;  Laterality: Left;   ESOPHAGOGASTRODUODENOSCOPY N/A 01/21/2019   Procedure: ESOPHAGOGASTRODUODENOSCOPY (EGD);  Surgeon: Armbruster, Steven P, MD;  Location: [facility] ENDOSCOPY;  Service: Gastroenterology;  Laterality: N/A;  at bedside. PCCM intubating patient and he will be ready for EGD by 10: 30-10:40am   ESOPHAGOGASTRODUODENOSCOPY (EGD) WITH PROPOFOL N/A 01/31/2019   Procedure: ESOPHAGOGASTRODUODENOSCOPY (EGD) WITH PROPOFOL;  Surgeon: Mann, Jyothi, MD;  Location: [facility] ENDOSCOPY;  Service: Endoscopy;  Laterality: N/A;   HEMOSTASIS CLIP PLACEMENT    01/21/2019   Procedure: HEMOSTASIS CLIP PLACEMENT;  Surgeon: Armbruster, Steven P, MD;  Location: [facility] ENDOSCOPY;  Service: Gastroenterology;;   HEMOSTASIS CONTROL  01/21/2019   Procedure: HEMOSTASIS CONTROL;  Surgeon: Armbruster, Steven P, MD;  Location: [facility] ENDOS[facility];  Service: Gastroenterology;;  Gold Probe   INSERTION OF DIALYSIS CATHETER Right 02/05/2019   Procedure: INSERTION OF DIALYSIS CATHETER RIGHT INTERNAL JUGULAR;  Surgeon: Fields, Charles E, MD;   Location: [facility] OR;  Service: Vascular;  Laterality: Right;   IR FLUORO GUIDE CV LINE RIGHT  01/20/2019   IR US GUIDE VASC ACCESS RIGHT  01/20/2019   SCLEROTHERAPY  01/21/2019   Procedure: SCLEROTHERAPY;  Surgeon: Armbruster, Steven P, MD;  Location: [facility] END[facility];  Service: Gastroenterology;;     reports that he has quit smoking. His smoking use included cigarettes. He has never used smokeless tobacco. He reports current alcohol use of about 5.0 standard drinks of alcohol per week. No history on file for drug use.  No Known Allergies  Family History  Problem Relation Age of Onset   Cancer Mother    Parkinson's disease Father     Prior to Admission medications   Medication Sig Start Date End Date Taking? Authorizing Provider  apixaban (ELIQUIS) 5 MG TABS tablet Take 1 tablet (5 mg total) by mouth 2 (two) times daily. Patient taking differently: Take 2.5 mg by mouth 2 (two) times daily. 03/04/19  Yes Angiulli, Arnet J, PA-C  atorvastatin (LIPITOR) 10 MG tablet Take 1 tablet (10 mg total) by mouth daily. 03/04/19  Yes Angiulli, Castle J, PA-C  calcitRIOL (ROCALTROL) 0.25 MCG capsule Take 1 capsule by mouth every other day. 01/10/21  Yes [provider]  dexamethasone (DECADRON) 4 MG tablet Take 1 tablet (4 mg total) by mouth daily. Take 5 tablets on the day of chemo 11/20/20  Yes Parsons, Melissa A, NP  furosemide (LASIX) 40 MG tablet Take 40 mg by mouth daily.   Yes [provider]  lenalidomide (REVLIMID) 15 MG capsule Take 15 mg by mouth daily. [employer] Auth #      Date Obtained    Yes [provider]  OVER THE COUNTER MEDICATION Take 1 tablet by mouth 2 (two) times daily. Citcrical 1200 mg tab. One in the Am one in the pm.   Yes [provider]  pantoprazole (PROTONIX) 40 MG tablet Take 1 tablet (40 mg total) by mouth 2 (two) times daily. 03/04/19  Yes Angiulli, Ebbie J, PA-C  traMADol (ULTRAM) 50 MG tablet Take 1 tablet by mouth every 12 (twelve) hours as needed  for pain.   Yes [provider]  Darbepoetin Alfa (ARANESP) 200 MCG/0.4ML SOSY injection Inject 0.4 mLs (200 mcg total) into the vein every Tuesday with hemodialysis. Patient not taking: No sig reported 02/16/19   Nettey, Ralph A, MD  midodrine (PROAMATINE) 10 MG tablet Take 1 tablet (10 mg total) by mouth 3 (three) times daily with meals. Patient not taking: No sig reported 03/04/19   Angiulli, Christop J, PA-C  multivitamin (RENA-VIT) TABS tablet Take 1 tablet by mouth at bedtime. Patient not taking: No sig reported 03/04/19   Angiulli, Teresa J, PA-C  polyethylene glycol (MIRALAX / GLYCOLAX) 17 g packet Take 17 g by mouth daily. Patient not taking: No sig reported 02/11/19   Nettey, Ralph A, MD  senna-docusate (SENOKOT-S) 8.6-50 MG tablet Take 1 tablet by mouth at bedtime. Patient not taking: No sig reported 02/10/19   Nettey, Ralph A, MD  sevelamer carbonate (RENVELA) 800 MG   tablet Take 3 tablets (2,400 mg total) by mouth 3 (three) times daily with meals. Patient not taking: No sig reported 03/04/19   Zailen, Albarran, PA-C    Physical Exam: Vitals:   03/20/21 1825 03/20/21 1830 03/20/21 1958 03/20/21 2016  BP: (!) 90/50 (!) 105/53 (!) 144/69 138/89  Pulse: 79 90 90 77  Resp: 18 (!) 25 (!) 24 (!) 24  Temp: (!) 97.4 F (36.3 C)  (!) 97.5 F (36.4 C) (!) 97.5 F (36.4 C)  TempSrc: Oral  Oral Oral  SpO2:  100% 100% 100%  Weight:        Physical Exam Vitals and nursing note reviewed.  Constitutional:      Appearance: He is not diaphoretic.     Comments: Chronically ill appearing  HENT:     Head: Normocephalic and atraumatic.     Nose: Nose normal.  Cardiovascular:     Rate and Rhythm: Normal rate and regular rhythm.  Pulmonary:     Effort: No respiratory distress.     Breath sounds: No wheezing.  Abdominal:     General: Abdomen is flat. Bowel sounds are normal. There is no distension.     Tenderness: There is no abdominal tenderness. There is no guarding.   Musculoskeletal:     Comments: Right ant chest wall port-a-cath  Skin:    Findings: Lesion present.     Comments: Stage 4 decubitus ulcer of right lateral ankle. Flexor tendons visible. See pictures.  Neurological:     Comments: Right hand paresis. Flexion contracture of right hand. Right lower extremity in flexion contracture. Pt is nonverbal but understand simple commands.     Labs on Admission: I have personally reviewed following labs and imaging studies  CBC: Recent Labs  Lab 03/20/21 1238 03/20/21 1311  WBC 8.1  --   NEUTROABS 5.8  --   HGB 5.7* 5.4*  HCT 19.0* 16.0*  MCV 101.1*  --   PLT 299  --    Basic Metabolic Panel: Recent Labs  Lab 03/20/21 1238 03/20/21 1311 03/20/21 1603  NA 133* 131*  --   K 4.6 4.4  --   CL 94* 95*  --   CO2 22  --   --   GLUCOSE 162* 161*  --   BUN 54* 54*  --   CREATININE 2.51* 2.50*  --   CALCIUM 8.9  --   --   MG  --   --  2.6*  PHOS  --   --  4.6   GFR: Estimated Creatinine Clearance: 24.5 mL/min (A) (by C-G formula based on SCr of 2.5 mg/dL (H)). Liver Function Tests: Recent Labs  Lab 03/20/21 1238  AST 36  ALT 39  ALKPHOS 58  BILITOT 1.0  PROT 5.7*  ALBUMIN 2.2*   Recent Labs  Lab 03/20/21 1603  LIPASE 23   No results for input(s): AMMONIA in the last 168 hours. Coagulation Profile: Recent Labs  Lab 03/20/21 1238  INR 1.9*   Cardiac Enzymes: No results for input(s): CKTOTAL, CKMB, CKMBINDEX, TROPONINI in the last 168 hours. BNP (last 3 results) No results for input(s): PROBNP in the last 8760 hours. HbA1C: No results for input(s): HGBA1C in the last 72 hours. CBG: No results for input(s): GLUCAP in the last 168 hours. Lipid Profile: No results for input(s): CHOL, HDL, LDLCALC, TRIG, CHOLHDL, LDLDIRECT in the last 72 hours. Thyroid Function Tests: No results for input(s): TSH, T4TOTAL, FREET4, T3FREE, THYROIDAB in the last 72 hours.  Anemia Panel: No results for input(s): VITAMINB12, FOLATE,  FERRITIN, TIBC, IRON, RETICCTPCT in the last 72 hours. Urine analysis:    Component Value Date/Time   COLORURINE YELLOW 03/20/2021 1840   APPEARANCEUR HAZY (A) 03/20/2021 1840   LABSPEC 1.013 03/20/2021 1840   PHURINE 5.0 03/20/2021 1840   GLUCOSEU NEGATIVE 03/20/2021 1840   HGBUR NEGATIVE 03/20/2021 1840   BILIRUBINUR NEGATIVE 03/20/2021 1840   KETONESUR NEGATIVE 03/20/2021 1840   PROTEINUR NEGATIVE 03/20/2021 1840   NITRITE NEGATIVE 03/20/2021 1840   LEUKOCYTESUR NEGATIVE 03/20/2021 1840    Radiological Exams on Admission: I have personally reviewed images DG Ankle Complete Right  Result Date: 03/20/2021 CLINICAL DATA:  Questionable sepsis EXAM: RIGHT ANKLE - COMPLETE 3+ VIEW COMPARISON:  None. FINDINGS: No fracture or dislocation of the right ankle. Mild ankle mortise arthrosis with a chronic, posttraumatic ossicle of the medial malleolar tip. Vascular calcinosis. IMPRESSION: No fracture or dislocation of the right ankle. Mild ankle mortise arthrosis with a chronic, posttraumatic ossicle of the medial malleolar tip. Electronically Signed   By: Eddie Candle M.D.   On: 03/20/2021 14:20   CT HEAD WO CONTRAST (5MM)  Result Date: 03/20/2021 CLINICAL DATA:  Neurological deficit EXAM: CT HEAD WITHOUT CONTRAST TECHNIQUE: Contiguous axial images were obtained from the base of the skull through the vertex without intravenous contrast. COMPARISON:  CT head dated February 19, 2021 FINDINGS: Brain: No evidence of acute infarction, hemorrhage, hydrocephalus, extra-axial collection or mass lesion/mass effect. Chronic white matter ischemic change. Old left basal ganglia lacunar infarcts. Vascular: No hyperdense vessel or unexpected calcification. Skull: Normal. Negative for fracture or focal lesion. Sinuses/Orbits: Fluid fluid level seen in the left sphenoid sinuses. Other: None. IMPRESSION: No acute intracranial abnormality. Fluid fluid level seen in the left sphenoid sinuses, findings can be seen in setting of  sinusitis. Electronically Signed   By: Yetta Glassman MD   On: 03/20/2021 15:22   DG Chest Port 1 View  Result Date: 03/20/2021 CLINICAL DATA:  Possible sepsis. EXAM: PORTABLE CHEST 1 VIEW COMPARISON:  Single-view of the chest 02/19/2021. FINDINGS: Right IJ approach Port-A-Cath is unchanged. Lungs clear. Heart size is normal. Aortic atherosclerosis. No pneumothorax or pleural fluid. No acute bony abnormality. Right fifth rib fracture again seen. IMPRESSION: No acute disease. Aortic Atherosclerosis (ICD10-I70.0). Electronically Signed   By: Inge Rise M.D.   On: 03/20/2021 14:19    EKG: I have personally reviewed EKG: sinus bradycardia   Assessment/Plan Principal Problem:   Acute on chronic anemia Active Problems:   ARF (acute renal failure) (HCC)   Decubitus ulcer of right ankle, stage 4 (HCC) - present on admission   Hypotension due to hypovolemia   Expressive aphasia   GIB (gastrointestinal bleeding)   Dysphagia   CKD (chronic kidney disease), stage IV (HCC)   Multiple myeloma not having achieved remission (HCC)   Anemia in chronic kidney disease    Acute on chronic anemia Admit to medical bed. Will need to repeat pt's HgB after 2nd unit of PRBC. Pt's SBP has improved with first unit of PRBC.  ARF (acute renal failure) ([place]) Will gently hydrate with IVF over next 24 hours. Repeat CMP. Check post-void residual to ensure that obstructive uropathy is not causing his AKI  Decubitus ulcer of right ankle, stage 4 (HCC) - present on admission Will empirically treat for cellulitis with IV Zosyn. Pt will need podiatry vs orthopedic consult to evaluate his stage 4 decubitus right ankle ulcer. Will likely need vascular studies to evaluate for  PAD/PVD.  Hypotension due to hypovolemia Hypotension has improved with IVF and PRBC. Monitor BP.  Expressive aphasia dtr notes that pt had initial CVA about 17 years ago that left him with right sided paresis. However, dtr states pt only  recently stopped speaking. Pt will need CVA workup with MRI brain, bubble echo, carotid dopplers. Given that his aphasia has been >72 hours, he is outside window for any aggressive management.  GIB (gastrointestinal bleeding) Pt with heme + stools. Pt had been on Eliquis for prior right UE DVT in 01-2019. Given his anemia and GIB, will need to stop Eliquis, maybe permanently.  Defer to daytime team if pt warrants inpatient GI bleeding evaluation.  Dysphagia Pt and wife(via phone) have noted pt having issues with swallowing for several weeks now. Wife has stated she need to grind up his meats and give thickened liquids in order to prevent pt from choking. Will need ST evaluation with possible MBS vs FEES.  CKD (chronic kidney disease), stage IV (HCC) Acutely worse. Baseline Scr 1.4-1.8. pt has been off hemodialysis for at least 2 years.  Multiple myeloma not having achieved remission (HCC) Followed by heme/onc at [facility].  Anemia in chronic kidney disease Chronic. Pt has epogen infusion at [facility] in 12/2020.  DVT prophylaxis: SCDs Code Status: Full Code Family Communication: discussed with pt's dtr Mary at bedside. Also talked with pt's wife via Mary's cell phone  Disposition Plan: home health vs SNF at discharge  Consults called: None  Admission status: Inpatient, Med-Surg    , DO [facility] 03/20/2021, 8:30 PM    

## 2021-03-20 NOTE — ED Triage Notes (Signed)
Pt here from home with c/o possible stroke 3 days hx of stroke , pt unable tot talk leaning to the right , also with a wound to the right ankle with a very foul odor coming from it , b/p low on arrival

## 2021-03-20 NOTE — ED Provider Notes (Signed)
Emergency Medicine Provider Triage Evaluation Note  Scott Mcdowell , a 80 y.o. male  was evaluated in triage.  Pt complains of concern for stroke. Family at bedside states he started becoming aphasic 3 days ago. Also has right ankle wound that has a foul odor and bleeding.  Review of Systems  Positive: Aphasia, ankle wound Negative: vomiting  Physical Exam  BP (!) 85/68   Pulse (!) 51   Temp 98.5 F (36.9 C) (Oral)   Resp 12   SpO2 100%  Gen:   Awake, ill appearing Resp:  Normal effort  Other:  Malodorous wound to the right ankle  Medical Decision Making  Medically screening exam initiated at 12:37 PM.  Appropriate orders placed.  Ronak Duquette [facility] was informed that the remainder of the evaluation will be completed by another provider, this initial triage assessment does not replace that evaluation, and the importance of remaining in the ED until their evaluation is complete.  Nursing notified pt needs to be prioritized for a room   Bishop Dublin 03/20/21 1241    Wyvonnia Dusky, MD 03/20/21 [phone]

## 2021-03-20 NOTE — ED Provider Notes (Signed)
[facility] EMERGENCY DEPARTMENT Provider Note   CSN: [id number] Arrival date & time: 03/20/21  1223     History No chief complaint on file.   Scott Mcdowell is a 80 y.o. male.  HPI Was discharged from [facility] on 7\15.  He has been getting wound care for a chronic wound on his ankle that has been worsening.  He was treated in patient and his wife reports the wound was worsened when he started treatment.  Now it has foul odor and is several times larger.  It seems to have pain with it.  This is on the right lateral ankle.  Patient has been declining over the past 2 weeks.  He has had some difficulty with speech over the past 3 months but has been on and off.  Mostly he has been doing well with speech and communication.  As a 3 days ago he started essentially babbling and not intelligible except for occasional yes and no words.  He was seen at the PCP office today and told that it was expressive aphasia and suspected stroke over the past days to weeks.  Patient has history of ESRD on dialysis.  He has a fistula but has not been requiring dialysis for approximately 18 months.  Patient's wife reports that in the past 12 months he has had 3 urinary tract infections for which he has been hospitalized.  They were thinking to bring him to the hospital yesterday but reports that EMS discussed with them and they did not think he was septic, but today patient his blood pressures are low in the PCP office and he is very ill in appearance be recommended to come to the emergency department.  Patient cannot give any history due to babbling type speech.    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   Multiple myeloma not having achieved remission ([place]) 06/26/2020   Hyperglycemia    Hemodialysis-associated hypotension    Peripheral edema    Labile blood pressure    Orthostatic hypotension    Steroid-induced hyperglycemia    History of supraventricular tachycardia    ESRD on dialysis [facility])    History of GI bleed    Debility 02/10/2019   AKI (acute kidney injury) ([place])    CKD (chronic kidney disease), stage IV (HCC)    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    Pleural effusion    History of CVA with residual deficit    SVT (supraventricular tachycardia) ([place])    Orthostasis    ESRD (end stage renal disease) ([place])    Gastrointestinal hemorrhage    Pressure injury of skin 01/30/2019   Goals of care, counseling/discussion 01/29/2019   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   Shock ([place]) 01/21/2019   Acute blood loss anemia 01/21/2019   Upper GI bleed 01/21/2019   B12 deficiency anemia 01/15/2019   Pleural effusion on right 01/15/2019   Ascites 01/15/2019   CAP (community acquired pneumonia) 01/15/2019   Elevated troponin 01/15/2019   Hypercalcemia 01/14/2019   ARF (acute renal failure) ([place]) 01/14/2019   Hyperkalemia 01/14/2019    Past Surgical History:  Procedure  Laterality Date   AV FISTULA PLACEMENT Left 02/05/2019   Procedure: ARTERIOVENOUS FISTULA CREATION LEFT ARM;  Surgeon: Elam Dutch, MD;  Location: [facility] OR;  Service: Vascular;  Laterality: Left;   ESOPHAGOGASTRODUODENOSCOPY N/A 01/21/2019   Procedure: ESOPHAGOGASTRODUODENOSCOPY (EGD);  Surgeon: Yetta Flock, MD;  Location: [facility];  Service: Gastroenterology;  Laterality: N/A;  at bedside. PCCM intubating patient and he will be ready for EGD by 10: 30-10:40am   ESOPHAGOGASTRODUODENOSCOPY (EGD) WITH PROPOFOL N/A 01/31/2019   Procedure: ESOPHAGOGASTRODUODENOSCOPY (EGD) WITH PROPOFOL;  Surgeon: Juanita Craver, MD;  Location: [facility] ENDOSCOPY;  Service: Endoscopy;   Laterality: N/A;   HEMOSTASIS CLIP PLACEMENT  01/21/2019   Procedure: HEMOSTASIS CLIP PLACEMENT;  Surgeon: Yetta Flock, MD;  Location: [facility] ENDOSCOPY;  Service: Gastroenterology;;   HEMOSTASIS CONTROL  01/21/2019   Procedure: HEMOSTASIS CONTROL;  Surgeon: Yetta Flock, MD;  Location: [facility] ENDOSCOPY;  Service: Gastroenterology;;  [place] Rm Probe   INSERTION OF DIALYSIS CATHETER Right 02/05/2019   Procedure: INSERTION OF DIALYSIS CATHETER RIGHT INTERNAL JUGULAR;  Surgeon: Elam Dutch, MD;  Location: [facility];  Service: Vascular;  Laterality: Right;   IR FLUORO GUIDE CV LINE RIGHT  01/20/2019   IR US GUIDE VASC ACCESS RIGHT  01/20/2019   SCLEROTHERAPY  01/21/2019   Procedure: Clide Deutscher;  Surgeon: Yetta Flock, MD;  Location: [facility] ENDOSCOPY;  Service: Gastroenterology;;       Family History  Problem Relation Age of Onset   Cancer Mother    Parkinson's disease Father     Social History   Tobacco Use   Smoking status: Former    Types: Cigarettes   Smokeless tobacco: Never  Substance Use Topics   Alcohol use: Yes    Alcohol/week: 5.0 standard drinks    Types: 5 Cans of beer per week    Home Medications Prior to Admission medications   Medication Sig Start Date End Date Taking? Authorizing Provider  apixaban (ELIQUIS) 5 MG TABS tablet Take 1 tablet (5 mg total) by mouth 2 (two) times daily. Patient taking differently: Take 2.5 mg by mouth 2 (two) times daily. 03/04/19  Yes Angiulli, Lavon Paganini, PA-C  atorvastatin (LIPITOR) 10 MG tablet Take 1 tablet (10 mg total) by mouth daily. 03/04/19  Yes Angiulli, Lavon Paganini, PA-C  calcitRIOL (ROCALTROL) 0.25 MCG capsule Take 1 capsule by mouth every other day. 01/10/21  Yes [provider]  dexamethasone (DECADRON) 4 MG tablet Take 1 tablet (4 mg total) by mouth daily. Take 5 tablets on the day of chemo 11/20/20  Yes Dayton Scrape A, NP  furosemide (LASIX) 40 MG tablet Take 40 mg by mouth daily.   Yes [provider]   lenalidomide (REVLIMID) 15 MG capsule Take 15 mg by mouth daily. Celgene    Yes [provider]  OVER THE COUNTER MEDICATION Take 1 tablet by mouth 2 (two) times daily. Citcrical 1200 mg tab. One in the Am one in the pm.   Yes [provider]  pantoprazole (PROTONIX) 40 MG tablet Take 1 tablet (40 mg total) by mouth 2 (two) times daily. 03/04/19  Yes Angiulli, Lavon Paganini, PA-C  traMADol (ULTRAM) 50 MG tablet Take 1 tablet by mouth every 12 (twelve) hours as needed for pain.   Yes [provider]  Darbepoetin Alfa (ARANESP) 200 MCG/0.4ML SOSY injection Inject 0.4 mLs (200 mcg total) into the vein every Tuesday with hemodialysis. Patient not taking: No sig reported 02/16/19   Mariel Aloe, MD  midodrine (PROAMATINE) 10 MG  tablet Take 1 tablet (10 mg total) by mouth 3 (three) times daily with meals. Patient not taking: No sig reported 03/04/19   Angiulli, Lavon Paganini, PA-C  multivitamin (RENA-VIT) TABS tablet Take 1 tablet by mouth at bedtime. Patient not taking: No sig reported 03/04/19   Angiulli, Lavon Paganini, PA-C  polyethylene glycol (MIRALAX / GLYCOLAX) 17 g packet Take 17 g by mouth daily. Patient not taking: No sig reported 02/11/19   Mariel Aloe, MD  senna-docusate (SENOKOT-S) 8.6-50 MG tablet Take 1 tablet by mouth at bedtime. Patient not taking: No sig reported 02/10/19   Mariel Aloe, MD  sevelamer carbonate (RENVELA) 800 MG tablet Take 3 tablets (2,400 mg total) by mouth 3 (three) times daily with meals. Patient not taking: No sig reported 03/04/19   Angiulli, Lavon Paganini, PA-C    Allergies    Patient has no known allergies.  Review of Systems   Review of Systems Level 5 caveat cannot obtain review of systems due to patient's verbal communication. Physical Exam Updated Vital Signs BP (!) 82/28   Pulse 95   Temp 98.5 F (36.9 C) (Oral)   Resp 20   Wt 75.9 kg   SpO2 100%   BMI 20.91 kg/m   Physical Exam Constitutional:      Comments: Patient is awake  and alert.  He appears severely debilitated with pallor and jaundice and cachexia, however, he does not appear to be in acute distress.  He is alert and looking around.  When I talked with him he will respond in a babbling language that is not intelligible, though it seems purposeful.  No Respiratory distress at rest.  HENT:     Head: Normocephalic and atraumatic.     Mouth/Throat:     Mouth: Mucous membranes are dry.  Eyes:     Extraocular Movements: Extraocular movements intact.  Cardiovascular:     Rate and Rhythm: Regular rhythm. Tachycardia present.  Pulmonary:     Effort: Pulmonary effort is normal.     Comments: Soft breath sounds Abdominal:     General: There is no distension.     Palpations: Abdomen is soft.     Tenderness: There is no abdominal tenderness. There is no guarding.  Genitourinary:    Comments: Rectal: Formed brown stool in the vault.  No melena. Musculoskeletal:     Comments: No significant erosive wounds to the buttocks.  Patient has a deep pressure wound to the right lateral ankle.  This is foul-smelling and has surrounding erythema.  See attached images for a visible tendon and subcutaneous muscle  Skin:    General: Skin is warm and dry.  Neurological:     Comments: Patient is awake and alert.  He will watch television and look at me.  He does assist somewhat in following commands.  Speech is garbled and not intelligible seems purposeful.  He can use his upper extremities to help to roll and to grab bed rails.          ED Results / Procedures / Treatments   Labs (all labs ordered are listed, but only abnormal results are displayed) Labs Reviewed  LACTIC ACID, PLASMA - Abnormal; Notable for the following components:      Result Value   Lactic Acid, Venous 8.5 (*)    All other components within normal limits  COMPREHENSIVE METABOLIC PANEL - Abnormal; Notable for the following components:   Sodium 133 (*)    Chloride 94 (*)    Glucose,  Bld 162 (*)     BUN 54 (*)    Creatinine, Ser 2.51 (*)    Total Protein 5.7 (*)    Albumin 2.2 (*)    GFR, Estimated 25 (*)    Anion gap 17 (*)    All other components within normal limits  CBC WITH DIFFERENTIAL/PLATELET - Abnormal; Notable for the following components:   RBC 1.88 (*)    Hemoglobin 5.7 (*)    HCT 19.0 (*)    MCV 101.1 (*)    RDW 17.9 (*)    nRBC 0.4 (*)    nRBC 1 (*)    All other components within normal limits  PROTIME-INR - Abnormal; Notable for the following components:   Prothrombin Time 21.6 (*)    INR 1.9 (*)    All other components within normal limits  I-STAT CHEM 8, ED - Abnormal; Notable for the following components:   Sodium 131 (*)    Chloride 95 (*)    BUN 54 (*)    Creatinine, Ser 2.50 (*)    Glucose, Bld 161 (*)    Calcium, Ion 1.02 (*)    Hemoglobin 5.4 (*)    HCT 16.0 (*)    All other components within normal limits  RESP PANEL BY RT-PCR (FLU A&B, COVID) ARPGX2  CULTURE, BLOOD (ROUTINE X 2)  CULTURE, BLOOD (ROUTINE X 2)  APTT  LACTIC ACID, PLASMA  URINALYSIS, ROUTINE W REFLEX MICROSCOPIC  LIPASE, BLOOD  BRAIN NATRIURETIC PEPTIDE  MAGNESIUM  PHOSPHORUS  POC OCCULT BLOOD, ED  TYPE AND SCREEN  PREPARE RBC (CROSSMATCH)  TROPONIN I (HIGH SENSITIVITY)  TROPONIN I (HIGH SENSITIVITY)    EKG EKG Interpretation  Date/Time:  Tuesday March 20 2021 13:21:53 EDT Ventricular Rate:  103 PR Interval:  147 QRS Duration: 162 QT Interval:  407 QTC Calculation: 533 R Axis:   204 Text Interpretation: Sinus tachycardia with irregular rate Right bundle branch block ST depression, consider ischemia, diffuse lds old RBBB, increased IVCD since last tracing Confirmed by Charlesetta Shanks [phone]) on 03/20/2021 3:23:57 PM  Radiology DG Ankle Complete Right  Result Date: 03/20/2021 CLINICAL DATA:  Questionable sepsis EXAM: RIGHT ANKLE - COMPLETE 3+ VIEW COMPARISON:  None. FINDINGS: No fracture or dislocation of the right ankle. Mild ankle mortise arthrosis with a chronic,  posttraumatic ossicle of the medial malleolar tip. Vascular calcinosis. IMPRESSION: No fracture or dislocation of the right ankle. Mild ankle mortise arthrosis with a chronic, posttraumatic ossicle of the medial malleolar tip. Electronically Signed   By: Eddie Candle M.D.   On: 03/20/2021 14:20   CT HEAD WO CONTRAST (5MM)  Result Date: 03/20/2021 CLINICAL DATA:  Neurological deficit EXAM: CT HEAD WITHOUT CONTRAST TECHNIQUE: Contiguous axial images were obtained from the base of the skull through the vertex without intravenous contrast. COMPARISON:  CT head dated February 19, 2021 FINDINGS: Brain: No evidence of acute infarction, hemorrhage, hydrocephalus, extra-axial collection or mass lesion/mass effect. Chronic white matter ischemic change. Old left basal ganglia lacunar infarcts. Vascular: No hyperdense vessel or unexpected calcification. Skull: Normal. Negative for fracture or focal lesion. Sinuses/Orbits: Fluid fluid level seen in the left sphenoid sinuses. Other: None. IMPRESSION: No acute intracranial abnormality. Fluid fluid level seen in the left sphenoid sinuses, findings can be seen in setting of sinusitis. Electronically Signed   By: Yetta Glassman MD   On: 03/20/2021 15:22   DG Chest Port 1 View  Result Date: 03/20/2021 CLINICAL DATA:  Possible sepsis. EXAM: PORTABLE CHEST 1 VIEW COMPARISON:  Single-view of the chest 02/19/2021. FINDINGS: Right IJ approach Port-A-Cath is unchanged. Lungs clear. Heart size is normal. Aortic atherosclerosis. No pneumothorax or pleural fluid. No acute bony abnormality. Right fifth rib fracture again seen. IMPRESSION: No acute disease. Aortic Atherosclerosis (ICD10-I70.0). Electronically Signed   By: Inge Rise M.D.   On: 03/20/2021 14:19    Procedures Procedures  CRITICAL CARE Performed by: Charlesetta Shanks   Total critical care time: 45 minutes  Critical care time was exclusive of separately billable procedures and treating other patients.  Critical  care was necessary to treat or prevent imminent or life-threatening deterioration.  Critical care was time spent personally by me on the following activities: development of treatment plan with patient and/or surrogate as well as nursing, discussions with consultants, evaluation of patient's response to treatment, examination of patient, obtaining history from patient or surrogate, ordering and performing treatments and interventions, ordering and review of laboratory studies, ordering and review of radiographic studies, pulse oximetry and re-evaluation of patient's condition.  Medications Ordered in ED Medications  0.9 %  sodium chloride infusion (has no administration in time range)  lactated ringers bolus 500 mL (has no administration in time range)  piperacillin-tazobactam (ZOSYN) IVPB 3.375 g (has no administration in time range)  vancomycin (VANCOREADY) IVPB 1500 mg/300 mL (has no administration in time range)    ED Course  I have reviewed the triage vital signs and the nursing notes.  Pertinent labs & imaging results that were available during my care of the patient were reviewed by me and considered in my medical decision making (see chart for details).    MDM Rules/Calculators/A&P                           Call reviewed with [facility] hospitalist for admission.  At this time they request consultation to intensivist.  And return call if intensivist deems appropriate for stepdown.  Patient presents with multiple medical problems.  Underlying condition of the full myeloma, persistent ankle wound, recurrent anemia requiring transfusion and acute on chronic mental status change.  At this time, patient is severely anemic at 5.6 will initiate transfusion.  Patient is hypotensive 44L systolic but mental status remains alert in no respiratory distress.  Blood transfusion initiated.  Patient has wound concerning for acute on chronic infection with foul smell and some surrounding cellulitis.  Will  initiate broad-spectrum antibiotics and volume resuscitation to include blood products.  At this time plan will be for admission. Final Clinical Impression(s) / ED Diagnoses Final diagnoses:  Pressure injury of right foot, stage 4 (HCC)  SIRS (systemic inflammatory response syndrome) (HCC)  Symptomatic anemia    Rx / DC Orders ED Discharge Orders     None        Charlesetta Shanks, MD 03/22/21 [phone]

## 2021-03-20 NOTE — Progress Notes (Addendum)
Pharmacy Antibiotic Note  Scott Mcdowell is a 80 y.o. male admitted on 03/20/2021 with cellulitis.  Pharmacy has been consulted for vancomycin dosing.  Plan: Vancomycin 1500mg  x1 then vancomycin variable ordered as Cr > 2mg /dL and unsure which way Cr trending  -Zosyn 3.375g IV q8h -Monitor renal function, clinical status, and antibiotic plan  Weight: 75.9 kg (167 lb 5.3 oz)  Temp (24hrs), Avg:97.7 F (36.5 C), Min:97.4 F (36.3 C), Max:98.5 F (36.9 C)  Recent Labs  Lab 03/20/21 1238 03/20/21 1256 03/20/21 1311 03/20/21 1603  WBC 8.1  --   --   --   CREATININE 2.51*  --  2.50*  --   LATICACIDVEN  --  8.5*  --  4.6*    Estimated Creatinine Clearance: 24.5 mL/min (A) (by C-G formula based on SCr of 2.5 mg/dL (H)).    No Known Allergies  Antimicrobials this admission: Vanc 8/2 >>  Zosyn 8/2 >>  Dose adjustments this admission: N/A  Microbiology results: 8/2 BCx:   Thank you for allowing pharmacy to be a part of this patient's care.  Joetta Manners, PharmD, [facility] Emergency Medicine Clinical Pharmacist ED RPh Phone: [place]: [phone]

## 2021-03-20 NOTE — Assessment & Plan Note (Signed)
Will gently hydrate with IVF over next 24 hours. Repeat CMP. Check post-void residual to ensure that obstructive uropathy is not causing his AKI

## 2021-03-20 NOTE — ED Notes (Signed)
Patient transported to CT 

## 2021-03-20 NOTE — Assessment & Plan Note (Signed)
Pt with heme + stools. Pt had been on Eliquis for prior right UE DVT in 01-2019. Given his anemia and GIB, will need to stop Eliquis, maybe permanently.  Defer to daytime team if pt warrants inpatient GI bleeding evaluation.

## 2021-03-20 NOTE — Assessment & Plan Note (Signed)
Pt and wife(via phone) have noted pt having issues with swallowing for several weeks now. Wife has stated she need to grind up his meats and give thickened liquids in order to prevent pt from choking. Will need ST evaluation with possible MBS vs FEES.

## 2021-03-20 NOTE — ED Notes (Signed)
Patient transported to MRI 

## 2021-03-20 NOTE — Subjective & Objective (Signed)
CC: right ankle wound HPI: 80 yo white male with a history of multiple myeloma, CKD stage IV, prior history of stroke, history of chronic anticoagulation due to right upper extremity DVT in 2020, chronic debility who presents to the ER from home.  Patient unable to give history or review of systems due to his expressive aphasia.  Patient's daughter [name] is at the bedside provides history.  He states the patient's been chronically ill for several years now.  He was diagnosed with multiple myeloma several years ago.  He also needed dialysis approximately a year due to renal failure.  He has been nonambulatory for the last year.  He was hospitalized at [facility] in May 2022 for pneumonia.  Over the last several days, daughter has noticed the patient being nonverbal.  He mumbles quite a bit but she cannot make out any cohesive speech.  She also has noted a sore on his lateral right ankle that has been growing progressively in size.  It also been draining along with foul odor.  Daughter has not noticed any black stools.  Due to the patient's aphasia, right ankle wound, daughter states that she called EMS.  EMS came out to the house.  They thought the wound looked clean.  Patient was brought to the PCP office today who advised the patient to go to the ER.  Daughter states that she brought the patient to [facility] as she did not want to take him to [facility].  Initially in the ER, the patient noted to be hypotensive with a heart rate of 51 and a blood pressure of 85/68.  Per ED provider, the patient had brown stools but was guaiac positive.  His initial hemoglobin was low at 5.4.  Serum creatinine was elevated 2.5.  Patient was given a small bolus of fluid.  2 units of blood was ordered.  CT head showed old left basal ganglia infarcts.  Chest x-ray negative for pneumonia.  Patient has a Port-A-Cath.  Right ankle x-rays negative for fracture.  Due to the patient's anemia, hypertension  and right ankle wound, tried hospitalist contacted for admission.

## 2021-03-20 NOTE — Assessment & Plan Note (Addendum)
Will empirically treat for cellulitis with IV Zosyn. Pt will need podiatry vs orthopedic consult to evaluate his stage 4 decubitus right ankle ulcer. Will likely need vascular studies to evaluate for PAD/PVD.

## 2021-03-20 NOTE — Assessment & Plan Note (Signed)
Hypotension has improved with IVF and PRBC. Monitor BP.

## 2021-03-20 NOTE — Assessment & Plan Note (Signed)
Admit to medical bed. Will need to repeat pt's HgB after 2nd unit of PRBC. Pt's SBP has improved with first unit of PRBC.

## 2021-03-20 NOTE — Assessment & Plan Note (Addendum)
dtr notes that pt had initial CVA about 17 years ago that left him with right sided paresis. However, dtr states pt only recently stopped speaking. Pt will need CVA workup with MRI brain, bubble echo, carotid dopplers. Given that his aphasia has been >72 hours, he is outside window for any aggressive management.

## 2021-03-21 ENCOUNTER — Encounter (HOSPITAL_COMMUNITY): Payer: Self-pay | Admitting: Internal Medicine

## 2021-03-21 ENCOUNTER — Inpatient Hospital Stay (HOSPITAL_COMMUNITY): Payer: Medicare Other

## 2021-03-21 ENCOUNTER — Other Ambulatory Visit: Payer: Self-pay

## 2021-03-21 DIAGNOSIS — R7881 Bacteremia: Secondary | ICD-10-CM

## 2021-03-21 DIAGNOSIS — N184 Chronic kidney disease, stage 4 (severe): Secondary | ICD-10-CM | POA: Diagnosis not present

## 2021-03-21 DIAGNOSIS — I639 Cerebral infarction, unspecified: Secondary | ICD-10-CM

## 2021-03-21 DIAGNOSIS — R6510 Systemic inflammatory response syndrome (SIRS) of non-infectious origin without acute organ dysfunction: Secondary | ICD-10-CM

## 2021-03-21 DIAGNOSIS — R479 Unspecified speech disturbances: Secondary | ICD-10-CM

## 2021-03-21 DIAGNOSIS — I6389 Other cerebral infarction: Secondary | ICD-10-CM | POA: Diagnosis not present

## 2021-03-21 DIAGNOSIS — D649 Anemia, unspecified: Secondary | ICD-10-CM | POA: Diagnosis not present

## 2021-03-21 DIAGNOSIS — L89514 Pressure ulcer of right ankle, stage 4: Secondary | ICD-10-CM

## 2021-03-21 DIAGNOSIS — B9561 Methicillin susceptible Staphylococcus aureus infection as the cause of diseases classified elsewhere: Secondary | ICD-10-CM

## 2021-03-21 LAB — COMPREHENSIVE METABOLIC PANEL
ALT: 29 U/L (ref 0–44)
AST: 17 U/L (ref 15–41)
Albumin: 1.9 g/dL — ABNORMAL LOW (ref 3.5–5.0)
Alkaline Phosphatase: 46 U/L (ref 38–126)
Anion gap: 12 (ref 5–15)
BUN: 58 mg/dL — ABNORMAL HIGH (ref 8–23)
CO2: 27 mmol/L (ref 22–32)
Calcium: 8.2 mg/dL — ABNORMAL LOW (ref 8.9–10.3)
Chloride: 95 mmol/L — ABNORMAL LOW (ref 98–111)
Creatinine, Ser: 2.3 mg/dL — ABNORMAL HIGH (ref 0.61–1.24)
GFR, Estimated: 28 mL/min — ABNORMAL LOW (ref >60)
Glucose, Bld: 95 mg/dL (ref 70–99)
Potassium: 3.6 mmol/L (ref 3.5–5.1)
Sodium: 134 mmol/L — ABNORMAL LOW (ref 135–145)
Total Bilirubin: 1.5 mg/dL — ABNORMAL HIGH (ref 0.3–1.2)
Total Protein: 4.8 g/dL — ABNORMAL LOW (ref 6.5–8.1)

## 2021-03-21 LAB — CBC WITH DIFFERENTIAL/PLATELET
Abs Immature Granulocytes: 0 10*3/uL (ref 0.00–0.07)
Basophils Absolute: 0 10*3/uL (ref 0.0–0.1)
Basophils Relative: 0 %
Eosinophils Absolute: 0 10*3/uL (ref 0.0–0.5)
Eosinophils Relative: 0 %
HCT: 24.1 % — ABNORMAL LOW (ref 39.0–52.0)
Hemoglobin: 7.8 g/dL — ABNORMAL LOW (ref 13.0–17.0)
Lymphocytes Relative: 14 %
Lymphs Abs: 0.6 10*3/uL — ABNORMAL LOW (ref 0.7–4.0)
MCH: 30 pg (ref 26.0–34.0)
MCHC: 32.4 g/dL (ref 30.0–36.0)
MCV: 92.7 fL (ref 80.0–100.0)
Monocytes Absolute: 0.3 10*3/uL (ref 0.1–1.0)
Monocytes Relative: 7 %
Neutro Abs: 3.6 10*3/uL (ref 1.7–7.7)
Neutrophils Relative %: 79 %
Platelets: 186 10*3/uL (ref 150–400)
RBC: 2.6 MIL/uL — ABNORMAL LOW (ref 4.22–5.81)
RDW: 17.2 % — ABNORMAL HIGH (ref 11.5–15.5)
WBC: 4.6 10*3/uL (ref 4.0–10.5)
nRBC: 0 /100 WBC
nRBC: 0.9 % — ABNORMAL HIGH (ref 0.0–0.2)

## 2021-03-21 LAB — BLOOD CULTURE ID PANEL (REFLEXED) - BCID2

## 2021-03-21 LAB — RETICULOCYTES
Immature Retic Fract: 28.7 % — ABNORMAL HIGH (ref 2.3–15.9)
RBC.: 2.63 MIL/uL — ABNORMAL LOW (ref 4.22–5.81)
Retic Count, Absolute: 56.3 10*3/uL (ref 19.0–186.0)
Retic Ct Pct: 2.1 % (ref 0.4–3.1)

## 2021-03-21 LAB — ECHOCARDIOGRAM COMPLETE BUBBLE STUDY
Area-P 1/2: 7.02 cm2
S' Lateral: 3.6 cm

## 2021-03-21 LAB — LACTIC ACID, PLASMA: Lactic Acid, Venous: 2.7 mmol/L (ref 0.5–1.9)

## 2021-03-21 LAB — IRON AND TIBC
Iron: 70 ug/dL (ref 45–182)
Saturation Ratios: 48 % — ABNORMAL HIGH (ref 17.9–39.5)
TIBC: 146 ug/dL — ABNORMAL LOW (ref 250–450)
UIBC: 76 ug/dL

## 2021-03-21 LAB — FOLATE: Folate: 15.5 ng/mL (ref >5.9)

## 2021-03-21 LAB — VITAMIN B12: Vitamin B-12: 188 pg/mL (ref 180–914)

## 2021-03-21 LAB — HEMOGLOBIN A1C
Hgb A1c MFr Bld: 5.3 % (ref 4.8–5.6)
Mean Plasma Glucose: 105.41 mg/dL

## 2021-03-21 LAB — FERRITIN: Ferritin: 3215 ng/mL — ABNORMAL HIGH (ref 24–336)

## 2021-03-21 MED ORDER — THIAMINE HCL 100 MG PO TABS
100.0000 mg | ORAL_TABLET | Freq: Every day | ORAL | Status: DC
  Administered 2021-03-22 – 2021-04-03 (×12): 100 mg via ORAL
  Filled 2021-03-21 (×12): qty 1

## 2021-03-21 MED ORDER — THIAMINE HCL 100 MG/ML IJ SOLN
100.0000 mg | Freq: Every day | INTRAMUSCULAR | Status: DC
  Administered 2021-03-21 – 2021-03-23 (×2): 100 mg via INTRAVENOUS
  Filled 2021-03-21 (×3): qty 2

## 2021-03-21 MED ORDER — SODIUM CHLORIDE 0.9 % IV BOLUS
500.0000 mL | Freq: Once | INTRAVENOUS | Status: AC
  Administered 2021-03-21: 500 mL via INTRAVENOUS

## 2021-03-21 MED ORDER — MIDODRINE HCL 5 MG PO TABS
10.0000 mg | ORAL_TABLET | Freq: Two times a day (BID) | ORAL | Status: DC
  Administered 2021-03-21 – 2021-03-23 (×4): 10 mg via ORAL
  Filled 2021-03-21 (×4): qty 2

## 2021-03-21 MED ORDER — VITAMIN B-12 1000 MCG PO TABS
1000.0000 ug | ORAL_TABLET | Freq: Every day | ORAL | Status: DC
  Administered 2021-03-21 – 2021-04-03 (×14): 1000 ug via ORAL
  Filled 2021-03-21 (×14): qty 1

## 2021-03-21 MED ORDER — SODIUM CHLORIDE 0.9 % IV SOLN: INTRAVENOUS | Status: DC

## 2021-03-21 MED ORDER — CEFAZOLIN SODIUM-DEXTROSE 2-4 GM/100ML-% IV SOLN
2.0000 g | Freq: Two times a day (BID) | INTRAVENOUS | Status: DC
  Administered 2021-03-21 – 2021-03-24 (×6): 2 g via INTRAVENOUS
  Filled 2021-03-21 (×6): qty 100

## 2021-03-21 MED ORDER — METRONIDAZOLE 500 MG/100ML IV SOLN
500.0000 mg | Freq: Two times a day (BID) | INTRAVENOUS | Status: DC
  Administered 2021-03-22 – 2021-03-25 (×8): 500 mg via INTRAVENOUS
  Filled 2021-03-21 (×8): qty 100

## 2021-03-21 MED ORDER — COLLAGENASE 250 UNIT/GM EX OINT
TOPICAL_OINTMENT | Freq: Every day | CUTANEOUS | Status: DC
  Administered 2021-04-03: 1 via TOPICAL
  Filled 2021-03-21 (×2): qty 30

## 2021-03-21 MED ORDER — CHLORHEXIDINE GLUCONATE CLOTH 2 % EX PADS
6.0000 | MEDICATED_PAD | Freq: Every day | CUTANEOUS | Status: DC
  Administered 2021-03-21 – 2021-04-03 (×15): 6 via TOPICAL

## 2021-03-21 NOTE — Consult Note (Addendum)
Neurology Consultation  Reason for Consult: Left hemisphere acute watershed infarcts Referring Physician: Dr. Broadus John  CC: Aphasia  History is obtained from: Chart review, Patient's daughter at bedside, Patient's wife via telephone  HPI: Scott Mcdowell is a 80 y.o. 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 Per patient's daughter, patient has had a wound on his right foot for some time and has had both inpatient and outpatient care. The wound was improving and he had home health wound care with improvement but he has not had the same level of care recently causing the wound to worsen. She first noticed the wound progression approximately 3 days ago along with garbled speech. She states that they did not think anything of the garbled speech because he has had intermittent speech disturbances since April of 2022 with word finding difficulties but these episodes were always transient. She also endorses a "few weeks" of dysphagia with frequent choking on his food requiring thickened liquids at home. Due to progressive right foot decubitus ulcer deterioration, family brought him to the ED for further evaluation. Initial work up revealed patient to be anemic with a hemoglobin of 5.7 requiring transfusion, his lactate was 8.5, and he was hypotensive and bradycardic with a heart rate of 51 and a blood pressure of 85/68 on arrival. Patient's stool was tested and was found to be guaiac positive.  Of note, at baseline, Scott Mcdowell has been bedbound for approximately 1-2 years. After his stroke in 2004, he has had persistent right-sided weakness with dragging his right foot while ambulating and  using his left arm to lift his right arm but he was able to sign checks with his right hand. It was not until a hospitalization approximately 2 years ago that his function began to decline and he has not ambulated much over the last year. At baseline, he is alert and oriented and conversant with family.   LKW: 03/17/2021 before bed tpa given?: no, patient with anemia, hemoccult + stool requiring blood transfusion, LKW > 3 days ago IR Thrombectomy? No, last known well > 3 days ago, no LVO identified.  Modified Rankin Scale: 5-Severe disability-bedridden, incontinent, needs constant attention  ROS: Unable to obtain due to altered mental status.   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 PCCM intubating patient and he will be ready for EGD by 10: 30-10:40am   ESOPHAGOGASTRODUODENOSCOPY (EGD) WITH PROPOFOL N/A 01/31/2019   Procedure: ESOPHAGOGASTRODUODENOSCOPY (EGD) WITH PROPOFOL;  Surgeon: Juanita Craver, MD;  Location: [facility] ENDOSCOPY;  Service: Endoscopy;  Laterality: N/A;   HEMOSTASIS CLIP PLACEMENT  01/21/2019   Procedure: HEMOSTASIS CLIP PLACEMENT;  Surgeon:  Yetta Flock, MD;  Location: [facility];  Service: Gastroenterology;;   HEMOSTASIS CONTROL  01/21/2019   Procedure: HEMOSTASIS CONTROL;  Surgeon: Yetta Flock, MD;  Location: [facility];   Service: Gastroenterology;;  Girtha Rm Probe   INSERTION OF DIALYSIS CATHETER Right 02/05/2019   Procedure: INSERTION OF DIALYSIS CATHETER RIGHT INTERNAL JUGULAR;  Surgeon: Elam Dutch, MD;  Location: [facility];  Service: Vascular;  Laterality: Right;   IR FLUORO GUIDE CV LINE RIGHT  01/20/2019   IR US GUIDE VASC ACCESS RIGHT  01/20/2019   SCLEROTHERAPY  01/21/2019   Procedure: Clide Deutscher;  Surgeon: Yetta Flock, MD;  Location: [facility] ENDOSCOPY;  Service: Gastroenterology;;   Family History  Problem Relation Age of Onset   Cancer Mother    Parkinson's disease Father    Social History:   reports that he has quit smoking. His smoking use included cigarettes. He has never used smokeless tobacco. He reports current alcohol use of about 5.0 standard drinks of alcohol per week. No history on file for drug use.  Medications  Current Facility-Administered Medications:    0.9 %  sodium chloride infusion, 10 mL/hr, Intravenous, Once, Kristopher Oppenheim, DO   0.9 %  sodium chloride infusion, , Intravenous, Continuous, Domenic Polite, MD, Last Rate: 75 mL/hr at 03/21/21 1119, New Bag at 03/21/21 1119   acetaminophen (TYLENOL) 160 MG/5ML solution 650 mg, 650 mg, Oral, Q6H PRN, Kristopher Oppenheim, DO, 650 mg at 03/20/21 2228   acetaminophen (TYLENOL) tablet 650 mg, 650 mg, Oral, Q6H PRN **OR** acetaminophen (TYLENOL) suppository 650 mg, 650 mg, Rectal, Q6H PRN, Kristopher Oppenheim, DO   ondansetron [facility]) tablet 4 mg, 4 mg, Oral, Q6H PRN **OR** ondansetron (ZOFRAN) injection 4 mg, 4 mg, Intravenous, Q6H PRN, Kristopher Oppenheim, DO   pantoprazole (PROTONIX) injection 40 mg, 40 mg, Intravenous, Q24H, Kristopher Oppenheim, DO, 40 mg at 03/20/21 2230   piperacillin-tazobactam (ZOSYN) IVPB 3.375 g, 3.375 g, Intravenous, Q8H, Kristopher Oppenheim, DO, Last Rate: 12.5 mL/hr at 03/21/21 1048, 3.375 g at 03/21/21 1048   vancomycin variable dose per unstable renal function (pharmacist dosing), , Does not apply, See admin instructions, Kristopher Oppenheim, DO  Current  Outpatient Medications:    apixaban (ELIQUIS) 5 MG TABS tablet, Take 1 tablet (5 mg total) by mouth 2 (two) times daily. (Patient taking differently: Take 2.5 mg by mouth 2 (two) times daily.), Disp: 60 tablet, Rfl: 1   atorvastatin (LIPITOR) 10 MG tablet, Take 1 tablet (10 mg total) by mouth daily., Disp: 30 tablet, Rfl: 0   calcitRIOL (ROCALTROL) 0.25 MCG capsule, Take 1 capsule by mouth every other day., Disp: , Rfl:    dexamethasone (DECADRON) 4 MG tablet, Take 1 tablet (4 mg total) by mouth daily. Take 5 tablets on the day of chemo, Disp: 40 tablet, Rfl: 1   furosemide (LASIX) 40 MG tablet, Take 40 mg by mouth daily., Disp: , Rfl:    lenalidomide (REVLIMID) 15 MG capsule, Take 15 mg by mouth daily. [employer] Auth #     Date Obtained, Disp: , Rfl:    OVER THE COUNTER MEDICATION, Take 1 tablet by mouth 2 (two) times daily. Citcrical 1200 mg tab. One in the Am one in the pm., Disp: , Rfl:    pantoprazole (PROTONIX) 40 MG tablet, Take 1 tablet (40 mg total) by mouth 2 (two) times daily., Disp: 60 tablet, Rfl: 1   traMADol (ULTRAM) 50 MG tablet, Take 1 tablet by mouth every 12 (twelve) hours as needed for pain., Disp: ,  Rfl:    Darbepoetin Alfa (ARANESP) 200 MCG/0.4ML SOSY injection, Inject 0.4 mLs (200 mcg total) into the vein every Tuesday with hemodialysis. (Patient not taking: No sig reported), Disp: 1.68 mL, Rfl:    midodrine (PROAMATINE) 10 MG tablet, Take 1 tablet (10 mg total) by mouth 3 (three) times daily with meals. (Patient not taking: No sig reported), Disp: 90 tablet, Rfl: 1   multivitamin (RENA-VIT) TABS tablet, Take 1 tablet by mouth at bedtime. (Patient not taking: No sig reported), Disp: 30 tablet, Rfl: 0   polyethylene glycol (MIRALAX / GLYCOLAX) 17 g packet, Take 17 g by mouth daily. (Patient not taking: No sig reported), Disp: 14 each, Rfl: 0   senna-docusate (SENOKOT-S) 8.6-50 MG tablet, Take 1 tablet by mouth at bedtime. (Patient not taking: No sig reported), Disp:  , Rfl:     sevelamer carbonate (RENVELA) 800 MG tablet, Take 3 tablets (2,400 mg total) by mouth 3 (three) times daily with meals. (Patient not taking: No sig reported), Disp: 90 tablet, Rfl: 1  Exam: Current vital signs: BP (!) 90/40 (BP Location: Right Leg)   Pulse 80   Temp 98 F (36.7 C) (Oral)   Resp 20   Wt 75.9 kg   SpO2 98%   BMI 20.91 kg/m  Vital signs in last 24 hours: Temp:  [97.1 F (36.2 C)-98.5 F (36.9 C)] 98 F (36.7 C) (08/03 0800) Pulse Rate:  [51-103] 80 (08/03 1138) Resp:  [12-47] 20 (08/03 1138) BP: (79-144)/(28-89) 90/40 (08/03 1138) SpO2:  [97 %-100 %] 98 % (08/03 1138) Weight:  [75.9 kg] 75.9 kg (08/02 1500)  GENERAL: Chronically ill-appearing frail man laying in bed, in no acute distress Psych: Affect appropriate for situation, cooperative with examination Head: Normocephalic and atraumatic with no obvious abnormality EENT: Normal conjunctivae, poor dentition, no OP obstruction LUNGS: Normal respiratory effort. Non-labored breathing CV: Irregular rate and rhythm on telemetry ABDOMEN: Soft, non-tender, non distended Ext: right foot with gauze dressing that is c/d/i. Scattered scabbing and skin tears throughout extremities without active hemorrhage.   NEURO:  Mental Status: Awake and alert. Unable to assess orientation due to aphasia.  Patient has expressive aphasia and is unable to name objects or repeat phrases.  He has minimal intelligible speech.  He intermittently follows simple commands with repeat instruction.  No neglect is noted.  Cranial Nerves:  II: PERRL 3 mm/brisk. Visual fields full.  III, IV, VI: EOMI without ptosis, nystagmus, or gaze preference.  V: Sensation is intact to light touch and symmetrical to face. Blinks to threat/ VII: Face is symmetric resting and smiling.  VIII: Hearing is intact to loud voice. Family reports hard of hearing at baseline IX, X: Palate elevation is symmetric.  XI: Head appears grossly midline XII: Tongue  protrudes midline without fasciculations.   Motor: 5/5 strength present in the left upper extremity. Left lower extremity 4/5 strength.  Right-sided weakness noted- chronic (per family right-sided weakness is worse than baseline) Right lower extremity 2/5 strength without attempt at antigravity movement, right upper extremity with some attempt at antigravity movement 3/5 strength.  Tone is increased on the right upper extremity with limited ROM at the shoulder, otherwise tone is normal throughout. Bulk is significantly decreased throughout.   Sensation: Intact to light touch bilaterally in all four extremities. Coordination: Unable to assess due to difficulty with following complex commands.  DTRs: 2+ and symmetric biceps and brachioradialis.  Gait: Deferred for patient safety, patient is bed-bound at baseline.   NIHSS: 1a Level  of Conscious.: 0 1b LOC Questions: 2 1c LOC Commands: 0 2 Best Gaze: 0 3 Visual: 0 4 Facial Palsy: 0 5a Motor Arm - left: 0 5b Motor Arm - Right: 2 6a Motor Leg - Left: 0 6b Motor Leg - Right: 3 7 Limb Ataxia: 0 8 Sensory: 0 9 Best Language: 2 10 Dysarthria: 1 11 Extinct. and Inatten.: 0 TOTAL: 10  Labs I have reviewed labs in epic and the results pertinent to this consultation are: CBC    Component Value Date/Time   WBC 4.6 03/21/2021 0800   RBC 2.60 (L) 03/21/2021 0800   RBC 2.63 (L) 03/21/2021 0800   HGB 7.8 (L) 03/21/2021 0800   HGB 9.0 (L) 06/21/2020 1133   HCT 24.1 (L) 03/21/2021 0800   HCT 22.4 (L) 01/18/2019 0459   PLT 186 03/21/2021 0800   PLT 127 (L) 06/21/2020 1133   MCV 92.7 03/21/2021 0800   MCV 101 (A) 09/27/2020 0000   MCH 30.0 03/21/2021 0800   MCHC 32.4 03/21/2021 0800   RDW 17.2 (H) 03/21/2021 0800   LYMPHSABS 0.6 (L) 03/21/2021 0800   MONOABS 0.3 03/21/2021 0800   EOSABS 0.0 03/21/2021 0800   BASOSABS 0.0 03/21/2021 0800   CMP     Component Value Date/Time   NA 134 (L) 03/21/2021 0800   NA 131 (A) 12/26/2020 0000   K  3.6 03/21/2021 0800   CL 95 (L) 03/21/2021 0800   CO2 27 03/21/2021 0800   GLUCOSE 95 03/21/2021 0800   BUN 58 (H) 03/21/2021 0800   BUN 53 (A) 12/26/2020 0000   CREATININE 2.30 (H) 03/21/2021 0800   CREATININE 2.14 (H) 06/21/2020 1133   CALCIUM 8.2 (L) 03/21/2021 0800   CALCIUM 9.3 02/02/2019 0739   PROT 4.8 (L) 03/21/2021 0800   ALBUMIN 1.9 (L) 03/21/2021 0800   AST 17 03/21/2021 0800   AST 33 06/21/2020 1133   ALT 29 03/21/2021 0800   ALT 51 (H) 06/21/2020 1133   ALKPHOS 46 03/21/2021 0800   BILITOT 1.5 (H) 03/21/2021 0800   BILITOT 0.8 06/21/2020 1133   GFRNONAA 28 (L) 03/21/2021 0800   GFRNONAA 30 (L) 06/21/2020 1133   GFRAA 12 (L) 03/04/2019 1425   Lipid Panel  No results found for: CHOL, TRIG, HDL, CHOLHDL, VLDL, LDLCALC, LDLDIRECT  Lab Results  Component Value Date   HGBA1C 5.3 03/21/2021   Results for orders placed or performed during the hospital encounter of 03/20/21  Resp Panel by RT-PCR (Flu A&B, Covid) Nasopharyngeal Swab     Status: None   Collection Time: 03/20/21 12:27 PM   Specimen: Nasopharyngeal Swab; Nasopharyngeal(NP) swabs in vial transport medium  Result Value Ref Range Status   SARS Coronavirus 2 by RT PCR NEGATIVE NEGATIVE Final    Comment: (NOTE) SARS-CoV-2 target nucleic acids are NOT DETECTED.  The SARS-CoV-2 RNA is generally detectable in upper respiratory specimens during the acute phase of infection. The lowest concentration of SARS-CoV-2 viral copies this assay can detect is 138 copies/mL. A negative result does not preclude SARS-Cov-2 infection and should not be used as the sole basis for treatment or other patient management decisions. A negative result may occur with  improper specimen collection/handling, submission of specimen other than nasopharyngeal swab, presence of viral mutation(s) within the areas targeted by this assay, and inadequate number of viral copies(<138 copies/mL). A negative result must be combined with clinical  observations, patient history, and epidemiological information. The expected result is Negative.  Fact Sheet for Patients:  EntrepreneurPulse.com.au  Fact Sheet for Healthcare Providers:  IncredibleEmployment.be  This test is no t yet approved or cleared by the [place] FDA and  has been authorized for detection and/or diagnosis of SARS-CoV-2 by FDA under an Emergency Use Authorization (EUA). This EUA will remain  in effect (meaning this test can be used) for the duration of the COVID-19 declaration under Section 564(b)(1) of the Act, 21 U.S.C.section 360bbb-3(b)(1), unless the authorization is terminated  or revoked sooner.       Influenza A by PCR NEGATIVE NEGATIVE Final   Influenza B by PCR NEGATIVE NEGATIVE Final    Comment: (NOTE) The Xpert Xpress SARS-CoV-2/FLU/RSV plus assay is intended as an aid in the diagnosis of influenza from Nasopharyngeal swab specimens and should not be used as a sole basis for treatment. Nasal washings and aspirates are unacceptable for Xpert Xpress SARS-CoV-2/FLU/RSV testing.  Fact Sheet for Patients: EntrepreneurPulse.com.au  Fact Sheet for Healthcare Providers: IncredibleEmployment.be  This test is not yet approved or cleared by the [place] FDA and has been authorized for detection and/or diagnosis of SARS-CoV-2 by FDA under an Emergency Use Authorization (EUA). This EUA will remain in effect (meaning this test can be used) for the duration of the COVID-19 declaration under Section 564(b)(1) of the Act, 21 U.S.C. section 360bbb-3(b)(1), unless the authorization is terminated or revoked.  Performed at [facility], [place] [address] [place], [place] [address]   Blood Culture (routine x 2)     Status: None (Preliminary result)   Collection Time: 03/20/21 12:56 PM   Specimen: BLOOD RIGHT FOREARM  Result Value Ref Range Status   Specimen Description  BLOOD RIGHT FOREARM  Final   Special Requests   Final    BOTTLES DRAWN AEROBIC AND ANAEROBIC Blood Culture results may not be optimal due to an inadequate volume of blood received in culture bottles   Culture  Setup Time   Final    GRAM POSITIVE COCCI IN CLUSTERS IN BOTH AEROBIC AND ANAEROBIC BOTTLES Organism ID to follow CRITICAL RESULT CALLED TO, READ BACK BY AND VERIFIED WITH: PHARMD ALEX LAWLESS 03/21/21_0  BY TW    Culture   Final    NO GROWTH < 24 HOURS Performed at [facility], [place] [address], [place], [place] [address]    Report Status PENDING  Incomplete  Blood Culture ID Panel (Reflexed)     Status: Abnormal   Collection Time: 03/20/21 12:56 PM  Result Value Ref Range Status   Enterococcus faecalis NOT DETECTED NOT DETECTED Final   Enterococcus Faecium NOT DETECTED NOT DETECTED Final   Listeria monocytogenes NOT DETECTED NOT DETECTED Final   Staphylococcus species DETECTED (A) NOT DETECTED Final    Comment: CRITICAL RESULT CALLED TO, READ BACK BY AND VERIFIED WITH: PHARMD ALEX LAWLESS 03/21/21_1  BY TW    Staphylococcus aureus (BCID) DETECTED (A) NOT DETECTED Final    Comment: CRITICAL RESULT CALLED TO, READ BACK BY AND VERIFIED WITH: PHARMD ALEX LAWLESS 03/21/21_2  BY TW    Staphylococcus epidermidis NOT DETECTED NOT DETECTED Final   Staphylococcus lugdunensis NOT DETECTED NOT DETECTED Final   Streptococcus species NOT DETECTED NOT DETECTED Final   Streptococcus agalactiae NOT DETECTED NOT DETECTED Final   Streptococcus pneumoniae NOT DETECTED NOT DETECTED Final   Streptococcus pyogenes NOT DETECTED NOT DETECTED Final   A.calcoaceticus-baumannii NOT DETECTED NOT DETECTED Final   Bacteroides fragilis NOT DETECTED NOT DETECTED Final   Enterobacterales NOT DETECTED NOT DETECTED Final   Enterobacter cloacae complex NOT DETECTED NOT DETECTED  Final   Escherichia coli NOT DETECTED NOT DETECTED Final   Klebsiella aerogenes NOT DETECTED NOT DETECTED Final    Klebsiella oxytoca NOT DETECTED NOT DETECTED Final   Klebsiella pneumoniae NOT DETECTED NOT DETECTED Final   Proteus species NOT DETECTED NOT DETECTED Final   Salmonella species NOT DETECTED NOT DETECTED Final   Serratia marcescens NOT DETECTED NOT DETECTED Final   Haemophilus influenzae NOT DETECTED NOT DETECTED Final   Neisseria meningitidis NOT DETECTED NOT DETECTED Final   Pseudomonas aeruginosa NOT DETECTED NOT DETECTED Final   Stenotrophomonas maltophilia NOT DETECTED NOT DETECTED Final   Candida albicans NOT DETECTED NOT DETECTED Final   Candida auris NOT DETECTED NOT DETECTED Final   Candida glabrata NOT DETECTED NOT DETECTED Final   Candida krusei NOT DETECTED NOT DETECTED Final   Candida parapsilosis NOT DETECTED NOT DETECTED Final   Candida tropicalis NOT DETECTED NOT DETECTED Final   Cryptococcus neoformans/gattii NOT DETECTED NOT DETECTED Final   Meth resistant mecA/C and MREJ NOT DETECTED NOT DETECTED Final    Comment: Performed at [facility], [place] [address] [place], [place] [address]   Lab Results  Component Value Date   FOLATE 15.5 03/21/2021   Lab Results  Component Value Date   VITAMINB12 188 03/21/2021    Imaging I have reviewed the images obtained:  CT-scan of the brain 03/20/2021: No acute intracranial abnormality.   Fluid fluid level seen in the left sphenoid sinuses, findings can be seen in setting of sinusitis.  MRI examination of the brain 03/20/2021: 1. Multifocal acute ischemia in a watershed distribution in the left hemisphere. No hemorrhage or mass effect. 2. 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  Vascular US Carotid Duplex 03/21/2021: Right Carotid: Velocities in the right ICA are consistent with a 1-39%  stenosis.  Non-hemodynamically significant plaque <50% noted in the CCA. The ECA appears <50% stenosed.   Left Carotid: Velocities in the left ICA are consistent with a 80-99% stenosis.  Non-hemodynamically  significant plaque <50% noted in the CCA. The ECA appears <50% stenosed.   Vertebrals: Right vertebral artery demonstrates antegrade flow. Left vertebral artery demonstrates high resistant flow with loss of diastolic flow.   Subclavians: Normal flow hemodynamics were seen in the right subclavian artery.  Abnormal flow seen in left subclavian artery (LUE AVF).   Assessment: 80 y.o. male with PMHx of multiple myeloma, CKD IV, CVA with residual right-sided deficits, RUE DVT on Eliquis and chronic debility who presented for evaluation of right foot wound worsening, aphasia and mild worsening of his prior R sided weakness for 3 days prior to admission. Work up revealed patient with hypotension with a SBP in the 80s, anemia with hgb of 5.7 reqiuring transfusion, acute renal failure, GIB with heme + stools, and an MRI brain with evidence of acute left hemisphere watershed infarcts.  - Examination reveals patient with right-sided weakness (chronic, but worse than baseline per daughter at bedside) and expressive aphasia with garbled / minimally intelligible speech. Initial NIHSS of 10.  - Carotid duplex US reveals left ICA with 80-99% stenosis. Patient with chronic left ICA stenosis on outside charts since 2014-2015. - Stroke risk factors include advanced age, history of CVA, history of DVT, CKD, multiple myeloma, hyperlipidemia, and hypertension.  - Etiology of stroke is likely cerebral hypoperfusion due to watershed appearance of infarcts with anemia and an initial hemoglobin of 5.7 requiring transfusion, heme + stools, and hypotension with a systolic blood pressure in the 80's.  Impression: History of CVA with residual right hemiparesis Acute left MCA distribution watershed infarcts Left ICA stenosis 80-99%  Expressive aphasia  Recommendations: # Left Hemispheric Watershed Acute Infarctions - HgbA1c at goal of <7%  - Fasting lipid panel; intensify statin therapy as needed for goal LDL of <70  - MRA  head without contrast - Frequent neuro checks - Echocardiogram - Maintain systolic blood pressure > 140 to support cerebral perfusion. - Prophylactic therapy- hold antiplatelet/anticoagulation medications at this time due to anemia requiring transfusion, GIB without source identification/control. - Risk factor modification - Telemetry monitoring - PT consult, OT consult, Speech consult - Stroke team to follow  # Impaired PO intake  - Vitamin B1 level ordered, pending - Thiamine 100 mg IV or PO daily  - Vitamin B12 supplementation 1,000 mcg daily PO   Pt seen by NP/Neuro and later by MD. Note/plan to be edited by MD as needed.  Anibal Henderson, AGAC-NP [facility] Pager: [phone]  NEUROHOSPITALIST ADDENDUM Performed a face to face diagnostic evaluation.   I have reviewed the contents of history and physical exam as documented by PA/ARNP/Resident and agree with above documentation.  I have discussed and formulated the above plan as documented. Edits to the note have been made as needed.  Keeping his Blood pressure above 140, will be key in preventing further expansion of the watershed infarct. Would also help if we can keep his hemoglobin above 8.  Donnetta Simpers, MD [facility] [phone]   If 7pm to 7am, please call on call as listed on AMION.

## 2021-03-21 NOTE — Progress Notes (Signed)
PHARMACY - PHYSICIAN COMMUNICATION CRITICAL VALUE ALERT - BLOOD CULTURE IDENTIFICATION (BCID)  Scott Mcdowell is an 80 y.o. male who presented to [facility] on 03/20/2021 with a chief complaint of cellulitis  Assessment:   Patient with R foot ulcer. 1 set of Bcx with GPC BCID identified S. Aureus, no resistance mechanisms detected.   8/2 Bcx: [place]   Name of physician (or Provider) Contacted: Domenic Polite, MD  Current antibiotics:  Vanc 8/2 >> 8/3 Zosyn 8/2 >> 8/3 Cefazolin 8/3 >> Metronidazole 8/3 >>   Changes to prescribed antibiotics recommended:  Recommended vancomycin to cefazolin for MSSA Discontinue zosyn Start Cefazolin 2g IV q12h Start Metronidazole 500 mg IV q12H   Lestine Box, PharmD PGY2 Infectious Diseases Pharmacy Resident   Please check [email or web address] for unit-specific pharmacy phone numbers

## 2021-03-21 NOTE — ED Notes (Signed)
Introduced myself to pt. Turned on tv for pt. Pt's blood continuing. Will get IVF and antibx afterwards. Pt aware. Denies further needs.

## 2021-03-21 NOTE — ED Notes (Signed)
Labs obtained, monitoring VS, no distress noted.

## 2021-03-21 NOTE — Progress Notes (Signed)
Carotid duplex bilateral study completed.  Preliminary results relayed to Broadus John, MD via phone at 11:23 am.  See CV Proc for preliminary results report.   Darlin Coco, RDMS, RVT for [facility], RVT

## 2021-03-21 NOTE — ED Notes (Signed)
RN spoke to daughter [name] on the phone, gave update on POC

## 2021-03-21 NOTE — Progress Notes (Signed)
Pt off floor to echo.

## 2021-03-21 NOTE — Consult Note (Signed)
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 Patient's  daughter noticed increasing mumbling/dysphasia of patient, in addition to redness and darkening of eschar, with bloody drainage to right ankle wound thus brought to hospital by EMS. Patient was found to be septic with hypotensive with sBP 85/60s, LA 8, plus guaiac + stools. Labs significant for hgb 5.4. elevated Cr of 2.5. no pain noted to portacath site. He was started on broad spectrum abtx with vancomycin plus piptazo. Infectious work up revealed + MSA bacteremia on [place]. Work up of dysphagia, noted to Left basal ganglia infarcts - watershed territory, [place] from hypotension/hypoperfusion. He was seen by wound care to assess wound . There is bone and tendon exposure and also tightly adhered eschar. Recommending santyl to help with cleaning wound bed.  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[place])    Stroke ([place]) 2004   w right sided weakness    Allergies: No Known  Allergies   MEDICATIONS:  Chlorhexidine Gluconate Cloth  6 each Topical Daily   collagenase   Topical Daily   midodrine  10 mg Oral BID WC   pantoprazole (PROTONIX) IV  40 mg Intravenous Q24H   thiamine injection  100 mg Intravenous Daily   Or   thiamine  100 mg Oral Daily   vitamin B-12  1,000 mcg Oral Daily    Social History   Tobacco Use   Smoking status: Former    Types: Cigarettes   Smokeless tobacco: Never  Substance Use Topics   Alcohol use: Yes    Alcohol/week: 5.0 standard drinks    Types: 5 Cans of beer per week    Family History  Problem Relation Age of Onset   Cancer Mother    Parkinson's disease Father     Review of Systems - unable to obtain since patient has expressive aphasia   OBJECTIVE: Temp:  [97.1 F (36.2 C)-98.1 F (36.7 C)] 98.1 F (36.7 C) (08/03 1627) Pulse Rate:  [73-90] 86 (08/03 1400) Resp:  [11-47] 20 (08/03 1627) BP: (82-144)/(36-89) 82/68 (08/03 1627) SpO2:  [97 %-100 %] 98 % (08/03 1627) Physical Exam  Constitutional: He is oriented to person, only. He appears well-developed and well-nourished. No distress.  HENT:  Mouth/Throat: Oropharynx is clear and moist. No oropharyngeal exudate.  Cardiovascular: Normal rate, regular rhythm and normal heart sounds. Exam reveals no gallop and no friction rub.  No murmur heard.  Pulmonary/Chest: Effort normal and breath sounds normal. No respiratory distress. He  has no wheezes. Chest wall: portacath to chest wall.  Abdominal: Soft. Bowel sounds are normal. He exhibits no distension. There is no tenderness.  Lymphadenopathy:  He has no cervical adenopathy.  Neurological: He is alert and oriented to person,, only. Contracted right arm. Some left arm rigidity Skin: Skin is warm and dry. No rash noted. No erythema. Right lateral malleolus eschar but some tendon exposure Psychiatric: He has a normal mood and affect. His behavior is normal.    LABS: Results for orders placed or performed during  the hospital encounter of 03/20/21 (from the past 48 hour(s))  Resp Panel by RT-PCR (Flu A&B, Covid) Nasopharyngeal Swab     Status: None   Collection Time: 03/20/21 12:27 PM   Specimen: Nasopharyngeal Swab; Nasopharyngeal(NP) swabs in vial transport medium  Result Value Ref Range   SARS Coronavirus 2 by RT PCR NEGATIVE NEGATIVE    Comment: (NOTE) SARS-CoV-2 target nucleic acids are NOT DETECTED.  The SARS-CoV-2 RNA is generally detectable in upper respiratory specimens during the acute phase of infection. The lowest concentration of SARS-CoV-2 viral copies this assay can detect is 138 copies/mL. A negative result does not preclude SARS-Cov-2 infection and should not be used as the sole basis for treatment or other patient management decisions. A negative result may occur with  improper specimen collection/handling, submission of specimen other than nasopharyngeal swab, presence of viral mutation(s) within the areas targeted by this assay, and inadequate number of viral copies(<138 copies/mL). A negative result must be combined with clinical observations, patient history, and epidemiological information. The expected result is Negative.  Fact Sheet for Patients:  EntrepreneurPulse.com.au  Fact Sheet for Healthcare Providers:  IncredibleEmployment.be  This test is no t yet approved or cleared by the [place] FDA and  has been authorized for detection and/or diagnosis of SARS-CoV-2 by FDA under an Emergency Use Authorization (EUA). This EUA will remain  in effect (meaning this test can be used) for the duration of the COVID-19 declaration under Section 564(b)(1) of the Act, 21 U.S.C.section 360bbb-3(b)(1), unless the authorization is terminated  or revoked sooner.       Influenza A by PCR NEGATIVE NEGATIVE   Influenza B by PCR NEGATIVE NEGATIVE    Comment: (NOTE) The Xpert Xpress SARS-CoV-2/FLU/RSV plus assay is intended as an aid in the  diagnosis of influenza from Nasopharyngeal swab specimens and should not be used as a sole basis for treatment. Nasal washings and aspirates are unacceptable for Xpert Xpress SARS-CoV-2/FLU/RSV testing.  Fact Sheet for Patients: EntrepreneurPulse.com.au  Fact Sheet for Healthcare Providers: IncredibleEmployment.be  This test is not yet approved or cleared by the [place] FDA and has been authorized for detection and/or diagnosis of SARS-CoV-2 by FDA under an Emergency Use Authorization (EUA). This EUA will remain in effect (meaning this test can be used) for the duration of the COVID-19 declaration under Section 564(b)(1) of the Act, 21 U.S.C. section 360bbb-3(b)(1), unless the authorization is terminated or revoked.  Performed at [facility], [place] [address] [place], [place] [address]   Comprehensive metabolic panel     Status: Abnormal   Collection Time: 03/20/21 12:38 PM  Result Value Ref Range   Sodium 133 (L) 135 - 145 mmol/L   Potassium 4.6 3.5 - 5.1 mmol/L   Chloride 94 (L) 98 - 111 mmol/L   CO2 22 22 - 32 mmol/L   Glucose, Bld 162 (H) 70 - 99 mg/dL    Comment: Glucose reference range applies only to samples  taken after fasting for at least 8 hours.   BUN 54 (H) 8 - 23 mg/dL   Creatinine, Ser 2.51 (H) 0.61 - 1.24 mg/dL   Calcium 8.9 8.9 - 10.3 mg/dL   Total Protein 5.7 (L) 6.5 - 8.1 g/dL   Albumin 2.2 (L) 3.5 - 5.0 g/dL   AST 36 15 - 41 U/L   ALT 39 0 - 44 U/L   Alkaline Phosphatase 58 38 - 126 U/L   Total Bilirubin 1.0 0.3 - 1.2 mg/dL   GFR, Estimated 25 (L) >60 mL/min    Comment: (NOTE) Calculated using the CKD-EPI Creatinine Equation (2021)    Anion gap 17 (H) 5 - 15    Comment: Performed at [facility], [place] [address] [place], [place] [address]  CBC WITH DIFFERENTIAL     Status: Abnormal   Collection Time: 03/20/21 12:38 PM  Result Value Ref Range   WBC 8.1 4.0 - 10.5 K/uL   RBC 1.88 (L) 4.22 - 5.81  MIL/uL    Comment: REPEATED TO VERIFY   Hemoglobin 5.7 (LL) 13.0 - 17.0 g/dL    Comment: REPEATED TO VERIFY THIS CRITICAL RESULT HAS VERIFIED AND BEEN CALLED TO B BECK RN BY KIRSTENE FORSYTH ON 08 02 2022 AT 2355, AND HAS BEEN READ BACK.     HCT 19.0 (L) 39.0 - 52.0 %   MCV 101.1 (H) 80.0 - 100.0 fL   MCH 30.3 26.0 - 34.0 pg   MCHC 30.0 30.0 - 36.0 g/dL   RDW 17.9 (H) 11.5 - 15.5 %   Platelets 299 150 - 400 K/uL   nRBC 0.4 (H) 0.0 - 0.2 %   Neutrophils Relative % 71 %   Neutro Abs 5.8 1.7 - 7.7 K/uL   Lymphocytes Relative 22 %   Lymphs Abs 1.8 0.7 - 4.0 K/uL   Monocytes Relative 7 %   Monocytes Absolute 0.6 0.1 - 1.0 K/uL   Eosinophils Relative 0 %   Eosinophils Absolute 0.0 0.0 - 0.5 K/uL   Basophils Relative 0 %   Basophils Absolute 0.0 0.0 - 0.1 K/uL   WBC Morphology See Note     Comment: Increased Bands. >20% Bands  Vaculated Neutrophils    nRBC 1 (H) 0 /100 WBC   Abs Immature Granulocytes 0.00 0.00 - 0.07 K/uL    Comment: Performed at [facility], [place] [address] [place], [place] [address]  Protime-INR     Status: Abnormal   Collection Time: 03/20/21 12:38 PM  Result Value Ref Range   Prothrombin Time 21.6 (H) 11.4 - 15.2 seconds   INR 1.9 (H) 0.8 - 1.2    Comment: (NOTE) INR goal varies based on device and disease states. Performed at [facility], [place] [address], [place], [place] [address]   APTT     Status: None   Collection Time: 03/20/21 12:38 PM  Result Value Ref Range   aPTT 30 24 - 36 seconds    Comment: Performed at [facility] [address] [place], [place] [address]  Brain natriuretic peptide     Status: Abnormal   Collection Time: 03/20/21 12:38 PM  Result Value Ref Range   B Natriuretic Peptide 2,196.0 (H) 0.0 - 100.0 pg/mL    Comment: Performed at [facility] [address], [place], [place] [address]  Lactic acid, plasma     Status: Abnormal   Collection Time: 03/20/21 12:56 PM  Result Value Ref Range  Lactic  Acid, Venous 8.5 (HH) 0.5 - 1.9 mmol/L    Comment: CRITICAL RESULT CALLED TO, READ BACK BY AND VERIFIED WITHMarja Kays RN [phone] BY A BENNETT Performed at [facility], [place] [address] [place], [place] [address]   Blood Culture (routine x 2)     Status: None (Preliminary result)   Collection Time: 03/20/21 12:56 PM   Specimen: BLOOD RIGHT FOREARM  Result Value Ref Range   Specimen Description BLOOD RIGHT FOREARM    Special Requests      BOTTLES DRAWN AEROBIC AND ANAEROBIC Blood Culture results may not be optimal due to an inadequate volume of blood received in culture bottles   Culture  Setup Time      GRAM POSITIVE COCCI IN CLUSTERS IN BOTH AEROBIC AND ANAEROBIC BOTTLES Organism ID to follow CRITICAL RESULT CALLED TO, READ BACK BY AND VERIFIED WITH: PHARMD ALEX LAWLESS 03/21/21'@1143'  BY TW    Culture      NO GROWTH < 24 HOURS Performed at [facility], [place] [address] [place], [place] [address]    Report Status PENDING   Blood Culture ID Panel (Reflexed)     Status: Abnormal   Collection Time: 03/20/21 12:56 PM  Result Value Ref Range   Enterococcus faecalis NOT DETECTED NOT DETECTED   Enterococcus Faecium NOT DETECTED NOT DETECTED   Listeria monocytogenes NOT DETECTED NOT DETECTED   Staphylococcus species DETECTED (A) NOT DETECTED    Comment: CRITICAL RESULT CALLED TO, READ BACK BY AND VERIFIED WITH: PHARMD ALEX LAWLESS 03/21/21'@1143'  BY TW    Staphylococcus aureus (BCID) DETECTED (A) NOT DETECTED    Comment: CRITICAL RESULT CALLED TO, READ BACK BY AND VERIFIED WITH: PHARMD ALEX LAWLESS 03/21/21'@11423'  BY TW    Staphylococcus epidermidis NOT DETECTED NOT DETECTED   Staphylococcus lugdunensis NOT DETECTED NOT DETECTED   Streptococcus species NOT DETECTED NOT DETECTED   Streptococcus agalactiae NOT DETECTED NOT DETECTED   Streptococcus pneumoniae NOT DETECTED NOT DETECTED   Streptococcus pyogenes NOT DETECTED NOT DETECTED   A.calcoaceticus-baumannii NOT DETECTED NOT  DETECTED   Bacteroides fragilis NOT DETECTED NOT DETECTED   Enterobacterales NOT DETECTED NOT DETECTED   Enterobacter cloacae complex NOT DETECTED NOT DETECTED   Escherichia coli NOT DETECTED NOT DETECTED   Klebsiella aerogenes NOT DETECTED NOT DETECTED   Klebsiella oxytoca NOT DETECTED NOT DETECTED   Klebsiella pneumoniae NOT DETECTED NOT DETECTED   Proteus species NOT DETECTED NOT DETECTED   Salmonella species NOT DETECTED NOT DETECTED   Serratia marcescens NOT DETECTED NOT DETECTED   Haemophilus influenzae NOT DETECTED NOT DETECTED   Neisseria meningitidis NOT DETECTED NOT DETECTED   Pseudomonas aeruginosa NOT DETECTED NOT DETECTED   Stenotrophomonas maltophilia NOT DETECTED NOT DETECTED   Candida albicans NOT DETECTED NOT DETECTED   Candida auris NOT DETECTED NOT DETECTED   Candida glabrata NOT DETECTED NOT DETECTED   Candida krusei NOT DETECTED NOT DETECTED   Candida parapsilosis NOT DETECTED NOT DETECTED   Candida tropicalis NOT DETECTED NOT DETECTED   Cryptococcus neoformans/gattii NOT DETECTED NOT DETECTED   Meth resistant mecA/C and MREJ NOT DETECTED NOT DETECTED    Comment: Performed at [facility], [address], [place], [place] [address]  I-stat chem 8, ED (not at [facility] or [facility])     Status: Abnormal   Collection Time: 03/20/21  1:11 PM  Result Value Ref Range   Sodium 131 (L) 135 - 145 mmol/L   Potassium 4.4 3.5 - 5.1 mmol/L  Chloride 95 (L) 98 - 111 mmol/L   BUN 54 (H) 8 - 23 mg/dL   Creatinine, Ser 2.50 (H) 0.61 - 1.24 mg/dL   Glucose, Bld 161 (H) 70 - 99 mg/dL    Comment: Glucose reference range applies only to samples taken after fasting for at least 8 hours.   Calcium, Ion 1.02 (L) 1.15 - 1.40 mmol/L   TCO2 25 22 - 32 mmol/L   Hemoglobin 5.4 (LL) 13.0 - 17.0 g/dL   HCT 16.0 (L) 39.0 - 52.0 %   Comment NOTIFIED PHYSICIAN   Type and screen [facility]     Status: None   Collection Time: 03/20/21  1:20 PM  Result Value Ref Range    ABO/RH(D) O POS    Antibody Screen NEG    Sample Expiration 03/23/2021,2359    Unit Number [id number]    Blood Component Type RBC LR PHER2    Unit division 00    Status of Unit ISSUED,FINAL    Transfusion Status OK TO TRANSFUSE    Crossmatch Result      Compatible Performed at [facility], [place] [address] [place], [place] [address]    Unit Number [id number]    Blood Component Type RBC LR PHER1    Unit division 00    Status of Unit ISSUED,FINAL    Transfusion Status OK TO TRANSFUSE    Crossmatch Result Compatible    Unit Number [id number]    Blood Component Type RED CELLS,LR    Unit division 00    Status of Unit ISSUED,FINAL    Transfusion Status OK TO TRANSFUSE    Crossmatch Result Compatible   Prepare RBC (crossmatch)     Status: None   Collection Time: 03/20/21  1:20 PM  Result Value Ref Range   Order Confirmation      ORDER PROCESSED BY BLOOD BANK Performed at [facility], [address] [place], [place] [address]   POC occult blood, ED Provider will collect     Status: Abnormal   Collection Time: 03/20/21  3:26 PM  Result Value Ref Range   Fecal Occult Bld POSITIVE (A) NEGATIVE  Lactic acid, plasma     Status: Abnormal   Collection Time: 03/20/21  4:03 PM  Result Value Ref Range   Lactic Acid, Venous 4.6 (HH) 0.5 - 1.9 mmol/L    Comment: CRITICAL VALUE NOTED.  VALUE IS CONSISTENT WITH PREVIOUSLY REPORTED AND CALLED VALUE. Performed at [facility], [place] [address], [place], [place] [address]   Troponin I (High Sensitivity)     Status: Abnormal   Collection Time: 03/20/21  4:03 PM  Result Value Ref Range   Troponin I (High Sensitivity) 900 (HH) <18 ng/L    Comment: CRITICAL RESULT CALLED TO, READ BACK BY AND VERIFIED WITH:  Janina Mayo, RN, 4174, 03/20/21, [place] (NOTE) Elevated high sensitivity troponin I (hsTnI) values and significant  changes across serial measurements may suggest ACS but many other  chronic and acute conditions are  known to elevate hsTnI results.  Refer to the Links section for chest pain algorithms and additional  guidance. Performed at [facility], [place] [address], [place], [place] [address]   Lipase, blood     Status: None   Collection Time: 03/20/21  4:03 PM  Result Value Ref Range   Lipase 23 11 - 51 U/L    Comment: Performed at [facility] [address] [place], [place] [address]  Magnesium     Status: Abnormal   Collection Time: 03/20/21  4:03 PM  Result Value Ref Range   Magnesium 2.6 (H) 1.7 - 2.4 mg/dL    Comment: Performed at [facility] [address] [place], [place] [address]  Phosphorus     Status: None   Collection Time: 03/20/21  4:03 PM  Result Value Ref Range   Phosphorus 4.6 2.5 - 4.6 mg/dL    Comment: Performed at [facility] [address] [place], [place] [address]  Urinalysis, Routine w reflex microscopic Urine, Clean Catch     Status: Abnormal   Collection Time: 03/20/21  6:40 PM  Result Value Ref Range   Color, Urine YELLOW YELLOW   APPearance HAZY (A) CLEAR   Specific Gravity, Urine 1.013 1.005 - 1.030   pH 5.0 5.0 - 8.0   Glucose, UA NEGATIVE NEGATIVE mg/dL   Hgb urine dipstick NEGATIVE NEGATIVE   Bilirubin Urine NEGATIVE NEGATIVE   Ketones, ur NEGATIVE NEGATIVE mg/dL   Protein, ur NEGATIVE NEGATIVE mg/dL   Nitrite NEGATIVE NEGATIVE   Leukocytes,Ua NEGATIVE NEGATIVE    Comment: Performed at [facility] [address] [place], [place] [address]  CBC with Differential/Platelet     Status: Abnormal   Collection Time: 03/21/21  8:00 AM  Result Value Ref Range   WBC 4.6 4.0 - 10.5 K/uL   RBC 2.60 (L) 4.22 - 5.81 MIL/uL   Hemoglobin 7.8 (L) 13.0 - 17.0 g/dL    Comment: REPEATED TO VERIFY POST TRANSFUSION SPECIMEN    HCT 24.1 (L) 39.0 - 52.0 %   MCV 92.7 80.0 - 100.0 fL    Comment: POST TRANSFUSION SPECIMEN REPEATED TO VERIFY    MCH 30.0 26.0 - 34.0 pg   MCHC 32.4 30.0 - 36.0 g/dL   RDW 17.2 (H) 11.5 - 15.5 %    Platelets 186 150 - 400 K/uL   nRBC 0.9 (H) 0.0 - 0.2 %   Neutrophils Relative % 79 %   Neutro Abs 3.6 1.7 - 7.7 K/uL   Lymphocytes Relative 14 %   Lymphs Abs 0.6 (L) 0.7 - 4.0 K/uL   Monocytes Relative 7 %   Monocytes Absolute 0.3 0.1 - 1.0 K/uL   Eosinophils Relative 0 %   Eosinophils Absolute 0.0 0.0 - 0.5 K/uL   Basophils Relative 0 %   Basophils Absolute 0.0 0.0 - 0.1 K/uL   nRBC 0 0 /100 WBC   Abs Immature Granulocytes 0.00 0.00 - 0.07 K/uL   Polychromasia PRESENT     Comment: Performed at [facility], [place] [address], [place], [place] [address]  Comprehensive metabolic panel     Status: Abnormal   Collection Time: 03/21/21  8:00 AM  Result Value Ref Range   Sodium 134 (L) 135 - 145 mmol/L   Potassium 3.6 3.5 - 5.1 mmol/L   Chloride 95 (L) 98 - 111 mmol/L   CO2 27 22 - 32 mmol/L   Glucose, Bld 95 70 - 99 mg/dL    Comment: Glucose reference range applies only to samples taken after fasting for at least 8 hours.   BUN 58 (H) 8 - 23 mg/dL   Creatinine, Ser 2.30 (H) 0.61 - 1.24 mg/dL   Calcium 8.2 (L) 8.9 - 10.3 mg/dL   Total Protein 4.8 (L) 6.5 - 8.1 g/dL   Albumin 1.9 (L) 3.5 - 5.0 g/dL   AST 17 15 - 41 U/L   ALT 29  0 - 44 U/L   Alkaline Phosphatase 46 38 - 126 U/L   Total Bilirubin 1.5 (H) 0.3 - 1.2 mg/dL   GFR, Estimated 28 (L) >60 mL/min    Comment: (NOTE) Calculated using the CKD-EPI Creatinine Equation (2021)    Anion gap 12 5 - 15    Comment: Performed at [facility] [address], [place], [place] [address]  Vitamin B12     Status: None   Collection Time: 03/21/21  8:00 AM  Result Value Ref Range   Vitamin B-12 188 180 - 914 pg/mL    Comment: (NOTE) This assay is not validated for testing neonatal or myeloproliferative syndrome specimens for Vitamin B12 levels. Performed at [facility], [place] [address], [place], [place] [address]   Folate     Status: None   Collection Time: 03/21/21  8:00 AM  Result Value Ref Range   Folate 15.5  >5.9 ng/mL    Comment: Performed at [facility], [place] [address], [place], [place] [address]  Iron and TIBC     Status: Abnormal   Collection Time: 03/21/21  8:00 AM  Result Value Ref Range   Iron 70 45 - 182 ug/dL   TIBC 146 (L) 250 - 450 ug/dL   Saturation Ratios 48 (H) 17.9 - 39.5 %   UIBC 76 ug/dL    Comment: Performed at [facility], [place] [address], [place], [place] [address]  Ferritin     Status: Abnormal   Collection Time: 03/21/21  8:00 AM  Result Value Ref Range   Ferritin 3,215 (H) 24 - 336 ng/mL    Comment: Performed at [facility], [place] [address], [place], [place] [address]  Reticulocytes     Status: Abnormal   Collection Time: 03/21/21  8:00 AM  Result Value Ref Range   Retic Ct Pct 2.1 0.4 - 3.1 %   RBC. 2.63 (L) 4.22 - 5.81 MIL/uL   Retic Count, Absolute 56.3 19.0 - 186.0 K/uL   Immature Retic Fract 28.7 (H) 2.3 - 15.9 %    Comment: Performed at [facility] [address] [place], [place] [address]  Hemoglobin A1c     Status: None   Collection Time: 03/21/21  8:00 AM  Result Value Ref Range   Hgb A1c MFr Bld 5.3 4.8 - 5.6 %    Comment: (NOTE) Pre diabetes:          5.7%-6.4%  Diabetes:              >6.4%  Glycemic control for   <7.0% adults with diabetes    Mean Plasma Glucose 105.41 mg/dL    Comment: Performed at [facility] [address] [place], [place] [address]    MICRO:  IMAGING: DG Ankle Complete Right  Result Date: 03/20/2021 CLINICAL DATA:  Questionable sepsis EXAM: RIGHT ANKLE - COMPLETE 3+ VIEW COMPARISON:  None. FINDINGS: No fracture or dislocation of the right ankle. Mild ankle mortise arthrosis with a chronic, posttraumatic ossicle of the medial malleolar tip. Vascular calcinosis. IMPRESSION: No fracture or dislocation of the right ankle. Mild ankle mortise arthrosis with a chronic, posttraumatic ossicle of the medial malleolar tip. Electronically Signed   By: Eddie Candle M.D.   On: 03/20/2021 14:20   CT  HEAD WO CONTRAST (5MM)  Result Date: 03/20/2021 CLINICAL DATA:  Neurological deficit EXAM: CT HEAD WITHOUT CONTRAST TECHNIQUE: Contiguous axial images were obtained from the base  of the skull through the vertex without intravenous contrast. COMPARISON:  CT head dated February 19, 2021 FINDINGS: Brain: No evidence of acute infarction, hemorrhage, hydrocephalus, extra-axial collection or mass lesion/mass effect. Chronic white matter ischemic change. Old left basal ganglia lacunar infarcts. Vascular: No hyperdense vessel or unexpected calcification. Skull: Normal. Negative for fracture or focal lesion. Sinuses/Orbits: Fluid fluid level seen in the left sphenoid sinuses. Other: None. IMPRESSION: No acute intracranial abnormality. Fluid fluid level seen in the left sphenoid sinuses, findings can be seen in setting of sinusitis. Electronically Signed   By: Yetta Glassman MD   On: 03/20/2021 15:22   MR BRAIN WO CONTRAST  Result Date: 03/20/2021 CLINICAL DATA:  Transient ischemic attack. History of multiple myeloma. EXAM: MRI HEAD WITHOUT CONTRAST TECHNIQUE: Multiplanar, multiecho pulse sequences of the brain and surrounding structures were obtained without intravenous contrast. COMPARISON:  01/10/2020 FINDINGS: Brain: Multifocal acute ischemia in a watershed distribution in the left hemisphere. No contralateral acute ischemia. No acute hemorrhage. No chronic microhemorrhage. 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 Mild cytotoxic edema associated with the acute insults. The midline structures are normal. Vascular: Major flow voids are preserved. Skull and upper cervical spine: Normal calvarium and skull base. Visualized upper cervical spine and soft tissues are normal. Sinuses/Orbits:No paranasal sinus fluid levels or advanced mucosal thickening. No mastoid or middle ear effusion. Normal orbits. IMPRESSION: 1. Multifocal acute ischemia in a watershed distribution in the left hemisphere. No hemorrhage  or mass effect. 2. 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 Electronically Signed   By: Ulyses Jarred M.D.   On: 03/20/2021 23:26   DG Chest Port 1 View  Result Date: 03/20/2021 CLINICAL DATA:  Possible sepsis. EXAM: PORTABLE CHEST 1 VIEW COMPARISON:  Single-view of the chest 02/19/2021. FINDINGS: Right IJ approach Port-A-Cath is unchanged. Lungs clear. Heart size is normal. Aortic atherosclerosis. No pneumothorax or pleural fluid. No acute bony abnormality. Right fifth rib fracture again seen. IMPRESSION: No acute disease. Aortic Atherosclerosis (ICD10-I70.0). Electronically Signed   By: Inge Rise M.D.   On: 03/20/2021 14:19   VAS US CAROTID  Result Date: 03/21/2021 Carotid Arterial Duplex Study Patient Name:  RYLEIGH BUENGER Bott  Date of Exam:   03/21/2021 Medical Rec #: [id number]                    Accession #:    [id number] Date of Birth: Feb 26, 1941                    Patient Gender: M Patient Age:   80Y Exam Location:  [facility] Procedure:      VAS US CAROTID Referring Phys: [id number] [place] --------------------------------------------------------------------------------  Indications:       Speech disturbance. Risk Factors:      Past history of smoking, prior CVA. 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 Comparison Study:  No previous exams Performing Technologist: Jody Hill RVT, RDMS  Examination Guidelines: A complete evaluation includes B-mode imaging, spectral Doppler, color Doppler, and power Doppler as needed of all accessible portions of each vessel. Bilateral testing is considered an integral part of a complete examination. Limited examinations for reoccurring indications may be performed as noted.  Right Carotid Findings: +----------+-------+--------+--------+-----------------------+-----------------+  PSV    EDV cm/sStenosisPlaque Description     Comments                     cm/s                                                            +----------+-------+--------+--------+-----------------------+-----------------+ CCA Prox  57     18              heterogenous           intimal                                                                   thickening        +----------+-------+--------+--------+-----------------------+-----------------+ CCA Distal44     12              heterogenous and       intimal                                            calcific               thickening        +----------+-------+--------+--------+-----------------------+-----------------+ ICA Prox  85     29      1-39%   calcific and irregular                   +----------+-------+--------+--------+-----------------------+-----------------+ ICA Distal56     19                                                       +----------+-------+--------+--------+-----------------------+-----------------+ ECA       56     0               irregular and calcific                   +----------+-------+--------+--------+-----------------------+-----------------+ +----------+--------+-------+----------------+-------------------+           PSV cm/sEDV cmsDescribe        Arm Pressure (mmHG) +----------+--------+-------+----------------+-------------------+ Subclavian30             Multiphasic, WNL                    +----------+--------+-------+----------------+-------------------+ +---------+--------+--+--------+-+---------+ VertebralPSV cm/s21EDV cm/s8Antegrade +---------+--------+--+--------+-+---------+  Left Carotid Findings: +----------+--------+--------+--------+---------------------+------------------+           PSV cm/sEDV cm/sStenosisPlaque Description   Comments           +----------+--------+--------+--------+---------------------+------------------+ CCA Prox  30      0               calcific             intimal  thickening +----------+--------+--------+--------+---------------------+------------------+ CCA Distal27      0  calcific and         intimal thickening                                   heterogenous                            +----------+--------+--------+--------+---------------------+------------------+ ICA Prox  269     120     80-99%  diffuse, calcific and                                                     heterogenous                            +----------+--------+--------+--------+---------------------+------------------+ ICA Mid   41      21                                                      +----------+--------+--------+--------+---------------------+------------------+ ICA Distal35      17                                                      +----------+--------+--------+--------+---------------------+------------------+ ECA       95      0               calcific                                +----------+--------+--------+--------+---------------------+------------------+ +----------+--------+--------+------------------+-------------------+           PSV cm/sEDV cm/sDescribe          Arm Pressure (mmHG) +----------+--------+--------+------------------+-------------------+ Subclavian100             Abnormal (LUE AVF)                    +----------+--------+--------+------------------+-------------------+ +---------+--------+--+--------+-+--------------------------------------------+ VertebralPSV cm/s23EDV cm/s0High resistant and with loss of diastolic                                flow                                         +---------+--------+--+--------+-+--------------------------------------------+   Summary: Right Carotid: Velocities in the right ICA are consistent with a 1-39% stenosis.                Non-hemodynamically significant plaque <50% noted in the CCA. The                ECA appears <50% stenosed.  Left Carotid: Velocities in the left ICA are consistent with a 80-99% stenosis.               Non-hemodynamically significant plaque <50% noted  in the CCA. The               ECA appears <50% stenosed. Vertebrals:  Right vertebral artery demonstrates antegrade flow. Left vertebral              artery demonstrates high resistant flow with loss of diastolic              flow. Subclavians: Normal flow hemodynamics were seen in the right subclavian artery.              Abnormal flow seen in left subclavian artery (LUE AVF). *See table(s) above for measurements and observations.  Vascular consult recommended. Electronically signed by Harold Barban MD on 03/21/2021 at 11:54:15 AM.    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  - recommend to narrow abtx to cefazolin, renally dose - stop vancomycin  - needs repeat blood culture - would need to treat for at minimum of 4 wk, then transition to orals - would also need TTE - given his port in place. Would need some line holiday and remove port before new line is placed - given multiple co-morbidities, would discuss this extensive plan with family  Ankle osteomyelitis = recommend to get sed rate and crp. Continue with santyl chemical debridement. No need for further imaging since has exposed bone.

## 2021-03-21 NOTE — Progress Notes (Signed)
  Echocardiogram 2D Echocardiogram has been performed.  Merrie Roof F 03/21/2021, 6:21 PM

## 2021-03-21 NOTE — ED Notes (Signed)
Daughter at bedside, ST at bedside evaluating pt swallow

## 2021-03-21 NOTE — ED Notes (Signed)
Obtained report from previous RN. Rounded on Pt. BP remains low. BP 104/40. Pt is alert to self, disoriented x3, speech is incomprehensible. IVF infusing through port.

## 2021-03-21 NOTE — Evaluation (Addendum)
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 PCCM intubating patient and he will be ready for EGD by 10: 30-10:40am   ESOPHAGOGASTRODUODENOSCOPY (EGD) WITH PROPOFOL N/A 01/31/2019   Procedure: ESOPHAGOGASTRODUODENOSCOPY (EGD) WITH PROPOFOL;  Surgeon: Juanita Craver, MD;  Location: [facility];  Service: Endoscopy;  Laterality: N/A;   HEMOSTASIS CLIP PLACEMENT  01/21/2019   Procedure: HEMOSTASIS CLIP PLACEMENT;  Surgeon: Yetta Flock, MD;  Location: [facility];  Service: Gastroenterology;;   HEMOSTASIS CONTROL  01/21/2019   Procedure: HEMOSTASIS CONTROL;  Surgeon: Yetta Flock, MD;  Location: [facility];  Service: Gastroenterology;;  [place] Rm Probe   INSERTION OF DIALYSIS CATHETER Right 02/05/2019   Procedure: INSERTION OF DIALYSIS CATHETER RIGHT INTERNAL JUGULAR;  Surgeon: Elam Dutch, MD;  Location: [facility];  Service: Vascular;  Laterality: Right;   IR FLUORO GUIDE CV LINE RIGHT  01/20/2019   IR US GUIDE VASC ACCESS RIGHT   01/20/2019   SCLEROTHERAPY  01/21/2019   Procedure: Clide Deutscher;  Surgeon: Yetta Flock, MD;  Location: [facility]OS[facility];  Service: Gastroenterology;;   HPI:  Pt is an 80 y/o male who presented to the ED secondary to right ankle wound. MRI brain8/2: Multifocal acute ischemia in a watershed distribution in the left hemisphere. CXR 8/2 negative. Per EMR, pt's wife "noted pt having issues with swallowing for several weeks now. 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   Assessment / Plan / Recommendation Clinical Impression  Pt was seen for bedside swallow evaluation in the ED with his daughter present and his wife present via phone. Pt's family clarified that the pt was eating regular/soft foods and thin liquids up until three days prior. Per the family, pt demonstrated prolonged mastication and pocking at that time so the pt's wife starting offering ground meats. Pt's family further stated that they were fearful of the pt "choking", but that he has not demonstrated any s/sx of aspiration. Oral mechanism exam was limited due to pt's difficulty following commands; however, oral motor strength and ROM appeared grossly WFL. He presented with four teeth and pt's daughter stated that he does not wear dentures. He tolerated all solids and liquids without signs or symptoms of oropharyngeal dysphagia. Mastication was [facility] despite limited dentition and no significant oral residue was noted. Pt's daughter reported that his presentation today was representative of that noted prior to the decline in swallow  function. A dysphagia 3 diet with thin liquids is recommended at this time and SLP will follow to ensure diet tolerance. SLP Visit Diagnosis: Dysphagia, unspecified (R13.10)    Aspiration  Risk  Mild aspiration risk    Diet Recommendation Dysphagia 3 (Mech soft);Thin liquid   Liquid Administration via: Cup;Straw Medication Administration: Whole meds with liquid Supervision: Patient able to self feed Compensations: Slow rate;Small sips/bites Postural Changes: Seated upright at 90 degrees    Other  Recommendations Oral Care Recommendations: Oral care BID   Follow up Recommendations None      Frequency and Duration min 2x/week  1 week       Prognosis Prognosis for Safe Diet Advancement: Good      Swallow Study   General Date of Onset: 03/18/21 HPI: Pt is an 80 y/o male who presented to the ED secondary to right ankle wound. MRI brain8/2: Multifocal acute ischemia in a watershed distribution in the left hemisphere. CXR 8/2 negative. Per EMR, pt's wife "noted pt having issues with swallowing for several weeks now. 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 Type of Study: Bedside Swallow Evaluation Previous Swallow Assessment: none Diet Prior to this Study: NPO Temperature Spikes Noted: No Respiratory Status: Room air History of Recent Intubation: No Behavior/Cognition: Alert;Cooperative;Pleasant mood Oral Cavity Assessment: Within Functional Limits Oral Care Completed by SLP: No Oral Cavity - Dentition: Missing dentition (pt presents with four teeth) Vision: Functional for self-feeding Self-Feeding Abilities: Needs assist Patient Positioning: Upright in bed;Postural control adequate for testing Baseline Vocal Quality: Normal Volitional Cough: Cognitively unable to elicit Volitional Swallow: Unable to elicit    Oral/Motor/Sensory Function Overall Oral Motor/Sensory Function: Within functional limits (limited assessment)   Ice Chips Ice  chips: Within functional limits Presentation: Spoon   Thin Liquid Thin Liquid: Within functional limits Presentation: Straw    Nectar Thick Nectar Thick Liquid: Not tested   Honey Thick Honey Thick Liquid: Not tested   Puree Puree: Within functional limits Presentation: Spoon   Solid    Tanaia Hawkey I. Hardin Negus, [place], [place] Office number [phone] Pager [phone] Solid: Within functional limits Presentation: Self Fed      [name] 03/21/2021,1:55 PM

## 2021-03-21 NOTE — Progress Notes (Addendum)
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  He has been hospitalized at [facility] multiple times in the last few months.  His right ankle pressure wound has been getting progressively worse with some drainage. In the ED patient was noted to be hypotensive with blood pressures in the 70-80s, hemoglobin was 5.4, creatinine was 2.5.  CT head noted old left basal ganglia infarcts.  Wife and daughter deny any blood in stools or black stools recently. 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 -Transfused 2 units of PRBC -Check anemia panel, myeloma panel -Not stable for endoscopic evaluation at this time in the setting of acute CVA, hypotension etc.  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 -Suspect hypotension is multifactorial, likely dehydration, blood loss and infection contributing, add IV fluids, add midodrine, antibiotics -Eliquis on hold in the setting of severe anemia -Request neurology input -PT OT, SLP evaluations  Hypotension -See discussion above, likely multifactorial, has bacteremia, severe anemia likely contributing, antibiotics and fluids as above -BP in 80-90range but MAPs >60 -repeat lactate  MSSA  bacteremia Right ankle wound -Change antibiotics to IV Ancef and Flagyl -Will repeat blood cultures tomorrow -Consider MRI without contrast to evaluate right ankle when blood pressures are more stable -Wound consult  CKD 4 -Baseline creatinine around 1.5, worse in the setting of severe anemia, hypotension etc. -Add IV fluids today, monitor urine output and creatinine closely  Dysphagia -SLP evaluation  Multiple myeloma -On Revlimid at this time, hold this  Ethics -This is a chronically ill elderly male who is bedbound with significant functional decline over the last 6 months to a year, multiple myeloma, stage IV kidney disease, now with acute CVA, bacteremia, severe anemia. -Discussed overall poor prognosis with patient's daughter, palliative medicine team consulted for goals of care   DVT prophylaxis: SCDs Code Status: Full code Family Communication: No family at bedside, called and updated patient's daughter Disposition Plan:  Status is: Inpatient  Remains inpatient appropriate because:Inpatient level of care appropriate due to severity of illness  Dispo: The patient is from: Home              Anticipated d/c is to: SNF              Patient currently is not medically stable to d/c.   Difficult to place patient No        Consultants:  Neurology  Procedures:   Antimicrobials:    Subjective: -Blood pressure soft but mostly asymptomatic, dysarthric speech  Objective: Vitals:   03/21/21 0649 03/21/21 0650 03/21/21 0800 03/21/21 1000  BP:   (!) 100/48 91/76  Pulse: 73 78 84 80  Resp: '18 20 17 ' (!) 47  Temp:   98 F (36.7 C)   TempSrc:   Oral   SpO2: 99% 100% 98% 97%  Weight:        Intake/Output Summary (Last 24 hours) at 03/21/2021 1142 Last data filed at 03/21/2021 0217 Gross per 24 hour  Intake 2476 ml  Output --  Net 2476 ml   Filed Weights   03/20/21 1500  Weight: 75.9 kg    Examination:  General exam: Chronically ill elderly male, sitting up  in bed, awake alert, mumbling, dysarthric speech HEENT: Port-A-Cath noted CVS: S1-S2, regular rate rhythm Lungs: Decreased breath sounds to bases Abdomen: Soft, nontender, nondistended, bowel sounds present Extremities: Right upper and lower extremity appears contracted, 3+ strength on the left, plantars are mute, follows simple commands Psychiatry: Poor insight and judgment  Data Reviewed:   CBC: Recent Labs  Lab 03/20/21 1238 03/20/21 1311 03/21/21 0800  WBC 8.1  --  4.6  NEUTROABS 5.8  --  3.6  HGB 5.7* 5.4* 7.8*  HCT 19.0* 16.0* 24.1*  MCV 101.1*  --  92.7  PLT 299  --  644   Basic Metabolic Panel: Recent Labs  Lab 03/20/21 1238 03/20/21 1311 03/20/21 1603 03/21/21 0800  NA 133* 131*  --  134*  K 4.6 4.4  --  3.6  CL 94* 95*  --  95*  CO2 22  --   --  27  GLUCOSE 162* 161*  --  95  BUN 54* 54*  --  58*  CREATININE 2.51* 2.50*  --  2.30*  CALCIUM 8.9  --   --  8.2*  MG  --   --  2.6*  --   PHOS  --   --  4.6  --    GFR: Estimated Creatinine Clearance: 26.6 mL/min (A) (by C-G formula based on SCr of 2.3 mg/dL (H)). Liver Function Tests: Recent Labs  Lab 03/20/21 1238 03/21/21 0800  AST 36 17  ALT 39 29  ALKPHOS 58 46  BILITOT 1.0 1.5*  PROT 5.7* 4.8*  ALBUMIN 2.2* 1.9*   Recent Labs  Lab 03/20/21 1603  LIPASE 23   No results for input(s): AMMONIA in the last 168 hours. Coagulation Profile: Recent Labs  Lab 03/20/21 1238  INR 1.9*   Cardiac Enzymes: No results for input(s): CKTOTAL, CKMB, CKMBINDEX, TROPONINI in the last 168 hours. BNP (last 3 results) No results for input(s): PROBNP in the last 8760 hours. HbA1C: No results for input(s): HGBA1C in the last 72 hours. CBG: No results for input(s): GLUCAP in the last 168 hours. Lipid Profile: No results for input(s): CHOL, HDL, LDLCALC, TRIG, CHOLHDL, LDLDIRECT in the last 72 hours. Thyroid Function Tests: No results for input(s): TSH, T4TOTAL, FREET4, T3FREE, THYROIDAB in the last 72  hours. Anemia Panel: Recent Labs    03/21/21 0800  VITAMINB12 188  FOLATE 15.5  FERRITIN 3,215*  TIBC 146*  IRON 70  RETICCTPCT 2.1   Urine analysis:    Component Value Date/Time   COLORURINE YELLOW 03/20/2021 1840   APPEARANCEUR HAZY (A) 03/20/2021 1840   LABSPEC 1.013 03/20/2021 1840   PHURINE 5.0 03/20/2021 1840   GLUCOSEU NEGATIVE 03/20/2021 1840   HGBUR NEGATIVE 03/20/2021 1840   BILIRUBINUR NEGATIVE 03/20/2021 1840   KETONESUR NEGATIVE 03/20/2021 1840   PROTEINUR NEGATIVE 03/20/2021 1840   NITRITE NEGATIVE 03/20/2021 1840   LEUKOCYTESUR NEGATIVE 03/20/2021 1840   Sepsis Labs: '@LABRCNTIP' (procalcitonin:4,lacticidven:4)  ) Recent Results (from the past 240 hour(s))  Resp Panel by RT-PCR (Flu A&B, Covid) Nasopharyngeal Swab     Status: None   Collection Time: 03/20/21 12:27 PM   Specimen: Nasopharyngeal Swab;  Nasopharyngeal(NP) swabs in vial transport medium  Result Value Ref Range Status   SARS Coronavirus 2 by RT PCR NEGATIVE NEGATIVE Final    Comment: (NOTE) SARS-CoV-2 target nucleic acids are NOT DETECTED.  The SARS-CoV-2 RNA is generally detectable in upper respiratory specimens during the acute phase of infection. The lowest concentration of SARS-CoV-2 viral copies this assay can detect is 138 copies/mL. A negative result does not preclude SARS-Cov-2 infection and should not be used as the sole basis for treatment or other patient management decisions. A negative result may occur with  improper specimen collection/handling, submission of specimen other than nasopharyngeal swab, presence of viral mutation(s) within the areas targeted by this assay, and inadequate number of viral copies(<138 copies/mL). A negative result must be combined with clinical observations, patient history, and epidemiological information. The expected result is Negative.  Fact Sheet for Patients:  EntrepreneurPulse.com.au  Fact Sheet for Healthcare Providers:   IncredibleEmployment.be  This test is no t yet approved or cleared by the [place] FDA and  has been authorized for detection and/or diagnosis of SARS-CoV-2 by FDA under an Emergency Use Authorization (EUA). This EUA will remain  in effect (meaning this test can be used) for the duration of the COVID-19 declaration under Section 564(b)(1) of the Act, 21 U.S.C.section 360bbb-3(b)(1), unless the authorization is terminated  or revoked sooner.       Influenza A by PCR NEGATIVE NEGATIVE Final   Influenza B by PCR NEGATIVE NEGATIVE Final    Comment: (NOTE) The Xpert Xpress SARS-CoV-2/FLU/RSV plus assay is intended as an aid in the diagnosis of influenza from Nasopharyngeal swab specimens and should not be used as a sole basis for treatment. Nasal washings and aspirates are unacceptable for Xpert Xpress SARS-CoV-2/FLU/RSV testing.  Fact Sheet for Patients: EntrepreneurPulse.com.au  Fact Sheet for Healthcare Providers: IncredibleEmployment.be  This test is not yet approved or cleared by the [place] FDA and has been authorized for detection and/or diagnosis of SARS-CoV-2 by FDA under an Emergency Use Authorization (EUA). This EUA will remain in effect (meaning this test can be used) for the duration of the COVID-19 declaration under Section 564(b)(1) of the Act, 21 U.S.C. section 360bbb-3(b)(1), unless the authorization is terminated or revoked.  Performed at [facility], [place] [address], [place], [place] [address]   Blood Culture (routine x 2)     Status: None (Preliminary result)   Collection Time: 03/20/21 12:56 PM   Specimen: BLOOD RIGHT FOREARM  Result Value Ref Range Status   Specimen Description BLOOD RIGHT FOREARM  Final   Special Requests   Final    BOTTLES DRAWN AEROBIC AND ANAEROBIC Blood Culture results may not be optimal due to an inadequate volume of blood received in culture bottles   Culture   Setup Time   Final    GRAM POSITIVE COCCI IN CLUSTERS IN BOTH AEROBIC AND ANAEROBIC BOTTLES Organism ID to follow    Culture   Final    NO GROWTH < 24 HOURS Performed at [facility], [place] [address] [place], [place] [address]    Report Status PENDING  Incomplete         Radiology Studies: DG Ankle Complete Right  Result Date: 03/20/2021 CLINICAL DATA:  Questionable sepsis EXAM: RIGHT ANKLE - COMPLETE 3+ VIEW COMPARISON:  None. FINDINGS: No fracture or dislocation of the right ankle. Mild ankle mortise arthrosis with a chronic, posttraumatic ossicle of the medial malleolar tip. Vascular calcinosis. IMPRESSION: No fracture or dislocation of  the right ankle. Mild ankle mortise arthrosis with a chronic, posttraumatic ossicle of the medial malleolar tip. Electronically Signed   By: Eddie Candle M.D.   On: 03/20/2021 14:20   CT HEAD WO CONTRAST (5MM)  Result Date: 03/20/2021 CLINICAL DATA:  Neurological deficit EXAM: CT HEAD WITHOUT CONTRAST TECHNIQUE: Contiguous axial images were obtained from the base of the skull through the vertex without intravenous contrast. COMPARISON:  CT head dated February 19, 2021 FINDINGS: Brain: No evidence of acute infarction, hemorrhage, hydrocephalus, extra-axial collection or mass lesion/mass effect. Chronic white matter ischemic change. Old left basal ganglia lacunar infarcts. Vascular: No hyperdense vessel or unexpected calcification. Skull: Normal. Negative for fracture or focal lesion. Sinuses/Orbits: Fluid fluid level seen in the left sphenoid sinuses. Other: None. IMPRESSION: No acute intracranial abnormality. Fluid fluid level seen in the left sphenoid sinuses, findings can be seen in setting of sinusitis. Electronically Signed   By: Yetta Glassman MD   On: 03/20/2021 15:22   MR BRAIN WO CONTRAST  Result Date: 03/20/2021 CLINICAL DATA:  Transient ischemic attack. History of multiple myeloma. EXAM: MRI HEAD WITHOUT CONTRAST TECHNIQUE: Multiplanar,  multiecho pulse sequences of the brain and surrounding structures were obtained without intravenous contrast. COMPARISON:  01/10/2020 FINDINGS: Brain: Multifocal acute ischemia in a watershed distribution in the left hemisphere. No contralateral acute ischemia. No acute hemorrhage. No chronic microhemorrhage. 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 Mild cytotoxic edema associated with the acute insults. The midline structures are normal. Vascular: Major flow voids are preserved. Skull and upper cervical spine: Normal calvarium and skull base. Visualized upper cervical spine and soft tissues are normal. Sinuses/Orbits:No paranasal sinus fluid levels or advanced mucosal thickening. No mastoid or middle ear effusion. Normal orbits. IMPRESSION: 1. Multifocal acute ischemia in a watershed distribution in the left hemisphere. No hemorrhage or mass effect. 2. 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 Electronically Signed   By: Ulyses Jarred M.D.   On: 03/20/2021 23:26   DG Chest Port 1 View  Result Date: 03/20/2021 CLINICAL DATA:  Possible sepsis. EXAM: PORTABLE CHEST 1 VIEW COMPARISON:  Single-view of the chest 02/19/2021. FINDINGS: Right IJ approach Port-A-Cath is unchanged. Lungs clear. Heart size is normal. Aortic atherosclerosis. No pneumothorax or pleural fluid. No acute bony abnormality. Right fifth rib fracture again seen. IMPRESSION: No acute disease. Aortic Atherosclerosis (ICD10-I70.0). Electronically Signed   By: Inge Rise M.D.   On: 03/20/2021 14:19   VAS US CAROTID  Result Date: 03/21/2021 Carotid Arterial Duplex Study Patient Name:  Scott [facility]owell [facility]  Date of Exam:   03/21/2021 Medical Rec #: [id number]                    Accession #:    [id number] Date of Birth: 25-Jan-1941                    Patient Gender: M Patient Age:   80Y Exam Location:  [facility] Procedure:      VAS US CAROTID Referring Phys: [id number] [place]  --------------------------------------------------------------------------------  Indications:       Speech disturbance. Risk Factors:      Past history of smoking, prior CVA. 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 Comparison Study:  No previous exams Performing  Technologist: Rogelia Rohrer RVT, RDMS  Examination Guidelines: A complete evaluation includes B-mode imaging, spectral Doppler, color Doppler, and power Doppler as needed of all accessible portions of each vessel. Bilateral testing is considered an integral part of a complete examination. Limited examinations for reoccurring indications may be performed as noted.  Right Carotid Findings: +----------+-------+--------+--------+-----------------------+-----------------+           PSV    EDV cm/sStenosisPlaque Description     Comments                    cm/s                                                            +----------+-------+--------+--------+-----------------------+-----------------+ CCA Prox  57     18              heterogenous           intimal                                                                   thickening        +----------+-------+--------+--------+-----------------------+-----------------+ CCA Distal44     12              heterogenous and       intimal                                            calcific               thickening        +----------+-------+--------+--------+-----------------------+-----------------+ ICA Prox  85     29      1-39%   calcific and irregular                   +----------+-------+--------+--------+-----------------------+-----------------+ ICA Distal56     19                                                       +----------+-------+--------+--------+-----------------------+-----------------+ ECA       56     0               irregular and calcific                    +----------+-------+--------+--------+-----------------------+-----------------+ +----------+--------+-------+----------------+-------------------+           PSV cm/sEDV cmsDescribe        Arm Pressure (mmHG) +----------+--------+-------+----------------+-------------------+ Subclavian30             Multiphasic, WNL                    +----------+--------+-------+----------------+-------------------+ +---------+--------+--+--------+-+---------+ VertebralPSV cm/s21EDV cm/s8Antegrade +---------+--------+--+--------+-+---------+  Left Carotid Findings: +----------+--------+--------+--------+---------------------+------------------+           PSV cm/sEDV cm/sStenosisPlaque Description  Comments           +----------+--------+--------+--------+---------------------+------------------+ CCA Prox  30      0               calcific             intimal thickening +----------+--------+--------+--------+---------------------+------------------+ CCA Distal27      0               calcific and         intimal thickening                                   heterogenous                            +----------+--------+--------+--------+---------------------+------------------+ ICA Prox  269     120     80-99%  diffuse, calcific and                                                     heterogenous                            +----------+--------+--------+--------+---------------------+------------------+ ICA Mid   41      21                                                      +----------+--------+--------+--------+---------------------+------------------+ ICA Distal35      17                                                      +----------+--------+--------+--------+---------------------+------------------+ ECA       95      0               calcific                                 +----------+--------+--------+--------+---------------------+------------------+ +----------+--------+--------+------------------+-------------------+           PSV cm/sEDV cm/sDescribe          Arm Pressure (mmHG) +----------+--------+--------+------------------+-------------------+ Subclavian100             Abnormal (LUE AVF)                    +----------+--------+--------+------------------+-------------------+ +---------+--------+--+--------+-+--------------------------------------------+ VertebralPSV cm/s23EDV cm/s0High resistant and with loss of diastolic                                flow                                         +---------+--------+--+--------+-+--------------------------------------------+   Summary: Right Carotid: Velocities in the right ICA are consistent with a 1-39% stenosis.  Non-hemodynamically significant plaque <50% noted in the CCA. The                ECA appears <50% stenosed. Left Carotid: Velocities in the left ICA are consistent with a 80-99% stenosis.               Non-hemodynamically significant plaque <50% noted in the CCA. The               ECA appears <50% stenosed. Vertebrals:  Right vertebral artery demonstrates antegrade flow. Left vertebral              artery demonstrates high resistant flow with loss of diastolic              flow. Subclavians: Normal flow hemodynamics were seen in the right subclavian artery.              Abnormal flow seen in left subclavian artery (LUE AVF). *See table(s) above for measurements and observations.  Vascular consult recommended.    Preliminary         Scheduled Meds:  pantoprazole (PROTONIX) IV  40 mg Intravenous Q24H   vancomycin variable dose per unstable renal function (pharmacist dosing)   Does not apply See admin instructions   Continuous Infusions:  sodium chloride     sodium chloride 75 mL/hr at 03/21/21 1119   piperacillin-tazobactam 3.375 g (03/21/21 1048)   sodium  chloride     sodium chloride       LOS: 1 day    Time spent:  11mn  PDomenic Polite MD [facility]   03/21/2021, 11:42 AM

## 2021-03-21 NOTE — Consult Note (Addendum)
[place] Nurse Consult Note: Reason for Consult: Consult requested for right ankle. Performed remotely after review of progress notes and photos in the EMR.   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 Pressure Injury POA: Yes Dressing procedure/placement/frequency: Topical treatment order provided for the bedside nurses to perform as follows to assist with enzymatic debridement of nonviable tissue: Apply Santyl to right ankle wound Q day, then cover with moist gauze and foam dressing.  (Change foam dressing Q 3 days or PRN soiling.) Please re-consult if further assistance is needed.  Thank-you,  Julien Girt MSN, [place], [place], [place], [place]

## 2021-03-22 ENCOUNTER — Inpatient Hospital Stay (HOSPITAL_COMMUNITY): Payer: Medicare Other

## 2021-03-22 DIAGNOSIS — D649 Anemia, unspecified: Secondary | ICD-10-CM | POA: Diagnosis not present

## 2021-03-22 DIAGNOSIS — I639 Cerebral infarction, unspecified: Secondary | ICD-10-CM | POA: Diagnosis present

## 2021-03-22 DIAGNOSIS — Z515 Encounter for palliative care: Secondary | ICD-10-CM

## 2021-03-22 LAB — CBC
HCT: 23.7 % — ABNORMAL LOW (ref 39.0–52.0)
Hemoglobin: 7.7 g/dL — ABNORMAL LOW (ref 13.0–17.0)
MCH: 30.1 pg (ref 26.0–34.0)
MCHC: 32.5 g/dL (ref 30.0–36.0)
MCV: 92.6 fL (ref 80.0–100.0)
Platelets: 152 10*3/uL (ref 150–400)
RBC: 2.56 MIL/uL — ABNORMAL LOW (ref 4.22–5.81)
RDW: 17.2 % — ABNORMAL HIGH (ref 11.5–15.5)
WBC: 4 10*3/uL (ref 4.0–10.5)
nRBC: 1.5 % — ABNORMAL HIGH (ref 0.0–0.2)

## 2021-03-22 LAB — BASIC METABOLIC PANEL
Anion gap: 13 (ref 5–15)
BUN: 51 mg/dL — ABNORMAL HIGH (ref 8–23)
CO2: 23 mmol/L (ref 22–32)
Calcium: 7.8 mg/dL — ABNORMAL LOW (ref 8.9–10.3)
Chloride: 100 mmol/L (ref 98–111)
Creatinine, Ser: 1.98 mg/dL — ABNORMAL HIGH (ref 0.61–1.24)
GFR, Estimated: 33 mL/min — ABNORMAL LOW (ref >60)
Glucose, Bld: 79 mg/dL (ref 70–99)
Potassium: 3.6 mmol/L (ref 3.5–5.1)
Sodium: 136 mmol/L (ref 135–145)

## 2021-03-22 LAB — LIPID PANEL
Cholesterol: 80 mg/dL (ref 0–200)
HDL: 20 mg/dL — ABNORMAL LOW (ref >40)
LDL Cholesterol: 41 mg/dL (ref 0–99)
Total CHOL/HDL Ratio: 4 RATIO
Triglycerides: 93 mg/dL (ref <150)
VLDL: 19 mg/dL (ref 0–40)

## 2021-03-22 LAB — C-REACTIVE PROTEIN: CRP: 15.1 mg/dL — ABNORMAL HIGH (ref <1.0)

## 2021-03-22 LAB — LACTIC ACID, PLASMA
Lactic Acid, Venous: 2.3 mmol/L (ref 0.5–1.9)
Lactic Acid, Venous: 1.6 mmol/L (ref 0.5–1.9)

## 2021-03-22 LAB — SEDIMENTATION RATE: Sed Rate: 58 mm/hr — ABNORMAL HIGH (ref 0–16)

## 2021-03-22 LAB — PREPARE RBC (CROSSMATCH)

## 2021-03-22 MED ORDER — DARBEPOETIN ALFA 40 MCG/0.4ML IJ SOSY
40.0000 ug | PREFILLED_SYRINGE | INTRAMUSCULAR | Status: DC
  Administered 2021-03-22 – 2021-03-29 (×2): 40 ug via SUBCUTANEOUS
  Filled 2021-03-22 (×2): qty 0.4

## 2021-03-22 MED ORDER — SODIUM CHLORIDE 0.9 % IV BOLUS
500.0000 mL | Freq: Once | INTRAVENOUS | Status: AC
  Administered 2021-03-22: 500 mL via INTRAVENOUS

## 2021-03-22 MED ORDER — SODIUM CHLORIDE 0.9% IV SOLUTION: Freq: Once | INTRAVENOUS | Status: AC

## 2021-03-22 MED ORDER — FERROUS SULFATE 325 (65 FE) MG PO TABS
325.0000 mg | ORAL_TABLET | Freq: Every day | ORAL | Status: DC
  Administered 2021-03-22 – 2021-04-03 (×13): 325 mg via ORAL
  Filled 2021-03-22 (×14): qty 1

## 2021-03-22 MED ORDER — SENNOSIDES-DOCUSATE SODIUM 8.6-50 MG PO TABS: 2.0000 | ORAL_TABLET | Freq: Every evening | ORAL | Status: DC | PRN

## 2021-03-22 MED ORDER — SODIUM CHLORIDE 0.9 % IV SOLN: INTRAVENOUS | Status: AC

## 2021-03-22 NOTE — Progress Notes (Addendum)
[facility] for Infectious Disease    Date of Admission:  03/20/2021   Total days of antibiotics 2           ID: Scott Mcdowell is a 80 y.o. 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 Still noted to have some blood in stool  Medications:   Chlorhexidine Gluconate Cloth  6 each Topical Daily   collagenase   Topical Daily   darbepoetin (ARANESP) injection - NON-DIALYSIS  40 mcg Subcutaneous Q Thu-1800   ferrous sulfate  325 mg Oral Q breakfast   midodrine  10 mg Oral BID WC   pantoprazole (PROTONIX) IV  40 mg Intravenous Q24H   thiamine injection  100 mg Intravenous Daily   Or   thiamine  100 mg Oral Daily   vitamin B-12  1,000 mcg Oral Daily    Objective: Vital signs in last 24 hours: Temp:  [97.6 F (36.4 C)-98.7 F (37.1 C)] 97.7 F (36.5 C) (08/04 1552) Pulse Rate:  [64-86] 74 (08/04 1416) Resp:  [17-20] 18 (08/04 1552) BP: (80-140)/(44-64) 118/60 (08/04 1552) SpO2:  [93 %-100 %] 100 % (08/04 1416) Physical Exam  Constitutional: He is oriented to person, appears chronically ill, and malnourished. No distress.  HENT:  Mouth/Throat: Oropharynx is clear and moist. No oropharyngeal exudate.  Cardiovascular: Normal rate, regular rhythm and normal heart sounds. Exam reveals no gallop and no friction rub.  No murmur heard.  Pulmonary/Chest: Effort normal and breath  sounds normal. No respiratory distress. He has no wheezes.  Abdominal: Soft. Bowel sounds are normal. He exhibits no distension. There is no tenderness.  Lymphadenopathy:  Ext: muscle wasting to extremities. Right malleolus wrapped Skin: Skin is warm and dry. No rash noted. No erythema.  Psychiatric: He has a normal mood and affect. His behavior is normal.    Lab Results Recent Labs    03/21/21 0800 03/22/21 0020  WBC 4.6 4.0  HGB 7.8* 7.7*  HCT 24.1* 23.7*  NA 134* 136  K 3.6 3.6  CL 95* 100  CO2 27 23  BUN 58* 51*  CREATININE 2.30* 1.98*   Liver Panel Recent Labs    03/20/21 1238 03/21/21 0800  PROT 5.7* 4.8*  ALBUMIN 2.2* 1.9*  AST 36 17  ALT 39 29  ALKPHOS 58 46  BILITOT 1.0 1.5*   Sedimentation Rate Recent Labs    03/22/21 0558  ESRSEDRATE 58*   C-Reactive Protein Recent Labs    03/22/21 0558  CRP 15.1*    Microbiology: reviewed Studies/Results: MR ANGIO HEAD WO CONTRAST  Result Date: 03/22/2021 CLINICAL DATA:  Stroke follow-up EXAM: MRA HEAD WITHOUT CONTRAST TECHNIQUE: Angiographic images of the Circle of Willis were acquired using MRA technique without intravenous contrast. COMPARISON:  No pertinent prior exam. FINDINGS: POSTERIOR CIRCULATION: --Vertebral arteries: Both are diminutive. There is loss of flow related enhancement of both vertebral  arteries proximal to the vertebrobasilar confluence. --Inferior cerebellar arteries: Normal. --Basilar artery: Diminutive with diffuse irregularity. --Superior cerebellar arteries: Normal on the left. Multifocal stenosis of the right. --Posterior cerebral arteries: Both have fetal type origins from the posterior communicating arteries. Otherwise normal. ANTERIOR CIRCULATION: --Intracranial internal carotid arteries: The left ICA is occluded. Right ICA is unremarkable. --Anterior cerebral arteries (ACA): Normal. --Middle cerebral arteries (MCA): Normal. ANATOMIC VARIANTS: Fetal origins of both PCAs IMPRESSION: 1.  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 2. 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 3. Diminutive and irregular basilar artery in the context of bilateral fetal type origins of the posterior cerebral arteries. Electronically Signed   By: Ulyses Jarred M.D.   On: 03/22/2021 03:36   MR BRAIN WO CONTRAST  Result Date: 03/20/2021 CLINICAL DATA:  Transient ischemic attack. History of multiple myeloma. EXAM: MRI HEAD WITHOUT CONTRAST TECHNIQUE: Multiplanar, multiecho pulse sequences of the brain and surrounding structures were obtained without intravenous contrast. COMPARISON:  01/10/2020 FINDINGS: Brain: Multifocal acute ischemia in a watershed distribution in the left hemisphere. No contralateral acute ischemia. No acute hemorrhage. No chronic microhemorrhage. 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 Mild cytotoxic edema associated with the acute insults. The midline structures are normal. Vascular: Major flow voids are preserved. Skull and upper cervical spine: Normal calvarium and skull base. Visualized upper cervical spine and soft tissues are normal. Sinuses/Orbits:No paranasal sinus fluid levels or advanced mucosal thickening. No mastoid or middle ear effusion. Normal orbits. IMPRESSION: 1. Multifocal acute ischemia in a watershed distribution in the left hemisphere. No hemorrhage or mass effect. 2. 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 Electronically Signed   By: Ulyses Jarred M.D.   On: 03/20/2021 23:26   ECHOCARDIOGRAM COMPLETE BUBBLE STUDY  Result Date: 03/21/2021    ECHOCARDIOGRAM REPORT   Patient Name:   Scott Mcdowell Scott Mcdowell Date of Exam: 03/21/2021 Medical Rec #:  [id number]                   Height:       75.0 in Accession #:    [id number]                  Weight:       167.3 lb Date of Birth:  July 01, 1941                   BSA:          2.034 m Patient Age:    10  years                    BP:           82/68 mmHg Patient Gender: M                           HR:           95 bpm. Exam Location:  Inpatient Procedure: 2D Echo, Cardiac Doppler and Color Doppler Indications:    Stroke  History:        Patient has prior history of Echocardiogram examinations, most                 recent 01/15/2019. COPD; Signs/Symptoms:Altered Mental Status.  Sonographer:    Merrie Roof RDCS Referring Phys: [id number] ERIC CHEN  Sonographer Comments: Suboptimal apical window. Restricted mobility. No peripheral IV  so no contrast and no bubble was performed at this time IMPRESSIONS  1. Left ventricular ejection fraction, by estimation, is 45 to 50%. The left ventricle has mildly decreased function. The left ventricle demonstrates regional wall motion abnormalities (see scoring diagram/findings for description). There is mild left ventricular hypertrophy. Left ventricular diastolic parameters are indeterminate.  2. Right ventricular systolic function is normal. The right ventricular size is normal.  3. Left atrial size was mildly dilated.  4. The mitral valve is normal in structure. Moderate mitral valve regurgitation. No evidence of mitral stenosis.  5. Tricuspid valve regurgitation is moderate.  6. The aortic valve is tricuspid. There is moderate calcification of the aortic valve. There is moderate thickening of the aortic valve. Aortic valve regurgitation is not visualized. Mild to moderate aortic valve sclerosis/calcification is present, without any evidence of aortic stenosis.  7. The inferior vena cava is normal in size with greater than 50% respiratory variability, suggesting right atrial pressure of 3 mmHg. Conclusion(s)/Recommendation(s): No intracardiac source of embolism detected on this transthoracic study. A transesophageal echocardiogram is recommended to exclude cardiac source of embolism if clinically indicated. FINDINGS  Left Ventricle: Left ventricular ejection fraction, by estimation, is 45  to 50%. The left ventricle has mildly decreased function. The left ventricle demonstrates regional wall motion abnormalities. The left ventricular internal cavity size was normal in size. There is mild left ventricular hypertrophy. Left ventricular diastolic parameters are indeterminate.  LV Wall Scoring: The apical anterior segment and apex are akinetic. The mid anterolateral segment and mid anterior segment are hypokinetic. Right Ventricle: The right ventricular size is normal. No increase in right ventricular wall thickness. Right ventricular systolic function is normal. Left Atrium: Left atrial size was mildly dilated. Right Atrium: Right atrial size was normal in size. Pericardium: There is no evidence of pericardial effusion. Mitral Valve: The mitral valve is normal in structure. Moderate mitral valve regurgitation. No evidence of mitral valve stenosis. Tricuspid Valve: The tricuspid valve is normal in structure. Tricuspid valve regurgitation is moderate . No evidence of tricuspid stenosis. Aortic Valve: The aortic valve is tricuspid. There is moderate calcification of the aortic valve. There is moderate thickening of the aortic valve. Aortic valve regurgitation is not visualized. Mild to moderate aortic valve sclerosis/calcification is present, without any evidence of aortic stenosis. Pulmonic Valve: The pulmonic valve was normal in structure. Pulmonic valve regurgitation is not visualized. No evidence of pulmonic stenosis. Aorta: The aortic root is normal in size and structure. Venous: The inferior vena cava is normal in size with greater than 50% respiratory variability, suggesting right atrial pressure of 3 mmHg. IAS/Shunts: No atrial level shunt detected by color flow Doppler.  LEFT VENTRICLE PLAX 2D LVIDd:         4.80 cm LVIDs:         3.60 cm LV PW:         1.20 cm LV IVS:        1.00 cm LVOT diam:     2.00 cm LV SV:         43 LV SV Index:   21 LVOT Area:     3.14 cm  RIGHT VENTRICLE RV Basal diam:   3.40 cm LEFT ATRIUM             Index       RIGHT ATRIUM           Index LA diam:        5.00 cm 2.46 cm/m  RA  Area:     17.10 cm LA Vol (A2C):   72.5 ml 35.64 ml/m RA Volume:   42.40 ml  20.84 ml/m LA Vol (A4C):   82.5 ml 40.56 ml/m LA Biplane Vol: 84.2 ml 41.39 ml/m  AORTIC VALVE LVOT Vmax:   82.80 cm/s LVOT Vmean:  54.500 cm/s LVOT VTI:    0.137 m  AORTA Ao Root diam: 3.30 cm MITRAL VALVE MV Area (PHT): 7.02 cm    SHUNTS MV Decel Time: 108 msec    Systemic VTI:  0.14 m MV E velocity: 81.80 cm/s  Systemic Diam: 2.00 cm MV A velocity: 70.70 cm/s MV E/A ratio:  1.16 Candee Furbish MD Electronically signed by Candee Furbish MD Signature Date/Time: 03/21/2021/6:35:14 PM    Final    VAS US CAROTID  Result Date: 03/21/2021 Carotid Arterial Duplex Study Patient Name:  Scott Mcdowell  Date of Exam:   03/21/2021 Medical Rec #: [id number]                    Accession #:    [id number] Date of Birth: 30-Sep-1940                    Patient Gender: M Patient Age:   80Y Exam [facility]  [facility] Procedure:      VAS US CAROTID Referring Phys: [id number] ERIC CHEN --------------------------------------------------------------------------------  Indications:       Speech disturbance. Risk Factors:      Past history of smoking, prior CVA. 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 Comparison Study:  No previous exams Performing Technologist: Jody Hill RVT, RDMS  Examination Guidelines: A complete evaluation includes B-mode imaging, spectral Doppler, color Doppler, and power Doppler as needed of all accessible portions of each vessel. Bilateral testing is considered an integral part of a complete examination. Limited examinations for reoccurring indications may be performed as noted.  Right Carotid Findings: +----------+-------+--------+--------+-----------------------+-----------------+           PSV    EDV cm/sStenosisPlaque Description      Comments                    cm/s                                                            +----------+-------+--------+--------+-----------------------+-----------------+ CCA Prox  57     18              heterogenous           intimal                                                                   thickening        +----------+-------+--------+--------+-----------------------+-----------------+ CCA Distal44     12              heterogenous and  intimal                                            calcific               thickening        +----------+-------+--------+--------+-----------------------+-----------------+ ICA Prox  85     29      1-39%   calcific and irregular                   +----------+-------+--------+--------+-----------------------+-----------------+ ICA Distal56     19                                                       +----------+-------+--------+--------+-----------------------+-----------------+ ECA       56     0               irregular and calcific                   +----------+-------+--------+--------+-----------------------+-----------------+ +----------+--------+-------+----------------+-------------------+           PSV cm/sEDV cmsDescribe        Arm Pressure (mmHG) +----------+--------+-------+----------------+-------------------+ Subclavian30             Multiphasic, WNL                    +----------+--------+-------+----------------+-------------------+ +---------+--------+--+--------+-+---------+ VertebralPSV cm/s21EDV cm/s8Antegrade +---------+--------+--+--------+-+---------+  Left Carotid Findings: +----------+--------+--------+--------+---------------------+------------------+           PSV cm/sEDV cm/sStenosisPlaque Description   Comments           +----------+--------+--------+--------+---------------------+------------------+ CCA Prox  30      0               calcific              intimal thickening +----------+--------+--------+--------+---------------------+------------------+ CCA Distal27      0               calcific and         intimal thickening                                   heterogenous                            +----------+--------+--------+--------+---------------------+------------------+ ICA Prox  269     120     80-99%  diffuse, calcific and                                                     heterogenous                            +----------+--------+--------+--------+---------------------+------------------+ ICA Mid   41      21                                                      +----------+--------+--------+--------+---------------------+------------------+  ICA Distal35      17                                                      +----------+--------+--------+--------+---------------------+------------------+ ECA       95      0               calcific                                +----------+--------+--------+--------+---------------------+------------------+ +----------+--------+--------+------------------+-------------------+           PSV cm/sEDV cm/sDescribe          Arm Pressure (mmHG) +----------+--------+--------+------------------+-------------------+ Subclavian100             Abnormal (LUE AVF)                    +----------+--------+--------+------------------+-------------------+ +---------+--------+--+--------+-+--------------------------------------------+ VertebralPSV cm/s23EDV cm/s0High resistant and with loss of diastolic                                flow                                         +---------+--------+--+--------+-+--------------------------------------------+   Summary: Right Carotid: Velocities in the right ICA are consistent with a 1-39% stenosis.                Non-hemodynamically significant plaque <50% noted in the CCA. The                ECA appears <50%  stenosed. Left Carotid: Velocities in the left ICA are consistent with a 80-99% stenosis.               Non-hemodynamically significant plaque <50% noted in the CCA. The               ECA appears <50% stenosed. Vertebrals:  Right vertebral artery demonstrates antegrade flow. Left vertebral              artery demonstrates high resistant flow with loss of diastolic              flow. Subclavians: Normal flow hemodynamics were seen in the right subclavian artery.              Abnormal flow seen in left subclavian artery (LUE AVF). *See table(s) above for measurements and observations.  Vascular consult recommended. Electronically signed by Harold Barban MD on 03/21/2021 at 11:54:15 AM.    Final      Assessment/Plan: MSSA bacteremia likely associated with right ankle osteo = continue with cefazolin. Repeat blood cx are pending. Plan to treat for 4 wk since negative blood cx  Port in place should be removed if family decides to do full course of care  Patient has multiple comorbidities and significant worsening of health which has brought him hear. Agree with team to bring in palliative care to discuss goals of care with family.  [facility] for Infectious Diseases Cell: [phone] Pager: [phone]  03/22/2021, 4:31 PM

## 2021-03-22 NOTE — Progress Notes (Signed)
PROGRESS NOTE    Scott Mcdowell 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  He has been hospitalized at [facility] multiple times in the last few months.  His right ankle pressure wound has been getting progressively worse with some drainage. In the ED patient was noted to be hypotensive with blood pressures in the 70-80s, hemoglobin was 5.4, creatinine was 2.5.  CT head noted old left basal ganglia infarcts.  Wife and daughter deny any blood in stools or black stools recently. 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 -Transfused 2 units of PRBC, anemia panel suggestive of chronic disease, no iron deficiency -Myeloma panel pending -Not stable for endoscopic evaluation at this time in the setting of acute CVA, hypotension etc. 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 -Suspect hypotension is multifactorial, likely dehydration, blood loss and infection contributing, blood pressure more stable now, continue IV fluids we will decrease rate, continue midodrine and IV antibiotics -Eliquis on hold in the setting of severe anemia -Appreciate neurology input,  recommended palliative care, no value in additional work-up -PT OT, SLP evaluations  Hypotension -See discussion above, likely multifactorial, has bacteremia, severe anemia likely contributing, antibiotics and fluids as above -BP in 80-90range but MAPs >60 -repeat lactate  MSSA bacteremia Right ankle wound, right ankle osteomyelitis, pressure wound-poa -Continue IV Ancef, Flagyl -Repeat blood cultures -Follow-up TTE -Appreciate infectious disease input, recommending line holiday and port removal -Will discuss with family -Continue wound care  CKD 4 -Baseline creatinine around 1.5, worse in the setting of severe anemia, hypotension etc. -More stable, continue gentle IV fluids today  Dysphagia -SLP evaluation ongoing, diet resumed  Multiple myeloma -On Revlimid at this time, hold this  Ethics -This is a chronically ill elderly male who is bedbound, right hemiplegia, pressure wounds with significant functional decline over the last 6 months to a year, multiple myeloma, stage IV kidney disease, now with acute CVA, bacteremia, severe anemia. -Discussed overall poor prognosis with patient's daughter, recommended DNR and symptom/comfort focused care, daughter would like to discuss this with her family, palliative medicine team consulted for goals of care   DVT prophylaxis: SCDs Code Status: Full code Family Communication: No family at bedside, called and updated patient's daughter yesterday Disposition Plan:  Status is: Inpatient  Remains inpatient appropriate because:Inpatient level of care appropriate due to severity of illness  Dispo: The patient is from: Home              Anticipated d/c is to: SNF              Patient currently is not medically stable to d/c.   Difficult to place patient No   Consultants:  Neurology Infectious disease  Procedures:   Antimicrobials:    Subjective: -Blood pressures have  been better, patient remains asymptomatic, tolerating dysphagia  diet  Objective: Vitals:   03/22/21 0726 03/22/21 1149 03/22/21 1231 03/22/21 1416  BP: 100/63 (!) 90/57 140/64 (!) 108/54  Pulse:  64 80 74  Resp: '19 17 18 18  ' Temp: 97.8 F (36.6 C) 97.7 F (36.5 C) 97.6 F (36.4 C) 97.8 F (36.6 C)  TempSrc: Oral Axillary Axillary Oral  SpO2:  96% 99% 100%  Weight:        Intake/Output Summary (Last 24 hours) at 03/22/2021 1420 Last data filed at 03/22/2021 1416 Gross per 24 hour  Intake 3090.07 ml  Output 300 ml  Net 2790.07 ml   Filed Weights   03/20/21 1500  Weight: 75.9 kg    Examination:  General exam: Chronically ill elderly male sitting up in bed, awake alert, dysarthric speech, no distress HEENT: Port-A-Cath noted CVS: S1-S2, regular rate rhythm Lungs: Decreased breath sounds to bases Abdomen: Soft, nontender, nondistended, bowel sounds present Extremities:  Right upper and lower extremity appears contracted, 3+ strength on the left, follows simple commands Psychiatry: Poor insight and judgment  Data Reviewed:   CBC: Recent Labs  Lab 03/20/21 1238 03/20/21 1311 03/21/21 0800 03/22/21 0020  WBC 8.1  --  4.6 4.0  NEUTROABS 5.8  --  3.6  --   HGB 5.7* 5.4* 7.8* 7.7*  HCT 19.0* 16.0* 24.1* 23.7*  MCV 101.1*  --  92.7 92.6  PLT 299  --  186 631   Basic Metabolic Panel: Recent Labs  Lab 03/20/21 1238 03/20/21 1311 03/20/21 1603 03/21/21 0800 03/22/21 0020  NA 133* 131*  --  134* 136  K 4.6 4.4  --  3.6 3.6  CL 94* 95*  --  95* 100  CO2 22  --   --  27 23  GLUCOSE 162* 161*  --  95 79  BUN 54* 54*  --  58* 51*  CREATININE 2.51* 2.50*  --  2.30* 1.98*  CALCIUM 8.9  --   --  8.2* 7.8*  MG  --   --  2.6*  --   --   PHOS  --   --  4.6  --   --    GFR: Estimated Creatinine Clearance: 30.9 mL/min (A) (by C-G formula based on SCr of 1.98 mg/dL (H)). Liver Function Tests: Recent Labs  Lab 03/20/21 1238 03/21/21 0800  AST 36 17  ALT 39 29  ALKPHOS 58 46  BILITOT 1.0 1.5*  PROT 5.7* 4.8*  ALBUMIN 2.2* 1.9*    Recent Labs  Lab 03/20/21 1603  LIPASE 23   No results for input(s): AMMONIA in the last 168 hours. Coagulation Profile: Recent Labs  Lab 03/20/21 1238  INR 1.9*   Cardiac Enzymes: No results for input(s): CKTOTAL, CKMB, CKMBINDEX, TROPONINI in the last 168 hours. BNP (last 3 results) No results for input(s): PROBNP in the last 8760 hours. HbA1C: Recent Labs    03/21/21 0800  HGBA1C 5.3   CBG: No results for input(s): GLUCAP in the last 168 hours. Lipid Profile: Recent Labs    03/22/21 0020  CHOL 80  HDL 20*  LDLCALC 41  TRIG 93  CHOLHDL 4.0   Thyroid Function Tests: No results for input(s): TSH, T4TOTAL, FREET4, T3FREE, THYROIDAB in the last 72 hours. Anemia Panel: Recent Labs    03/21/21 0800  VITAMINB12 188  FOLATE 15.5  FERRITIN 3,215*  TIBC 146*  IRON 70  RETICCTPCT 2.1   Urine analysis:    Component Value Date/Time  COLORURINE YELLOW 03/20/2021 1840   APPEARANCEUR HAZY (A) 03/20/2021 1840   LABSPEC 1.013 03/20/2021 1840   PHURINE 5.0 03/20/2021 1840   GLUCOSEU NEGATIVE 03/20/2021 1840   HGBUR NEGATIVE 03/20/2021 1840   BILIRUBINUR NEGATIVE 03/20/2021 1840   KETONESUR NEGATIVE 03/20/2021 1840   PROTEINUR NEGATIVE 03/20/2021 1840   NITRITE NEGATIVE 03/20/2021 1840   LEUKOCYTESUR NEGATIVE 03/20/2021 1840   Sepsis Labs: '@LABRCNTIP' (procalcitonin:4,lacticidven:4)  ) Recent Results (from the past 240 hour(s))  Resp Panel by RT-PCR (Flu A&B, Covid) Nasopharyngeal Swab     Status: None   Collection Time: 03/20/21 12:27 PM   Specimen: Nasopharyngeal Swab; Nasopharyngeal(NP) swabs in vial transport medium  Result Value Ref Range Status   SARS Coronavirus 2 by RT PCR NEGATIVE NEGATIVE Final    Comment: (NOTE) SARS-CoV-2 target nucleic acids are NOT DETECTED.  The SARS-CoV-2 RNA is generally detectable in upper respiratory specimens during the acute phase of infection. The lowest concentration of SARS-CoV-2 viral copies this assay can detect  is 138 copies/mL. A negative result does not preclude SARS-Cov-2 infection and should not be used as the sole basis for treatment or other patient management decisions. A negative result may occur with  improper specimen collection/handling, submission of specimen other than nasopharyngeal swab, presence of viral mutation(s) within the areas targeted by this assay, and inadequate number of viral copies(<138 copies/mL). A negative result must be combined with clinical observations, patient history, and epidemiological information. The expected result is Negative.  Fact Sheet for Patients:  EntrepreneurPulse.com.au  Fact Sheet for Healthcare Providers:  IncredibleEmployment.be  This test is no t yet approved or cleared by the [place] FDA and  has been authorized for detection and/or diagnosis of SARS-CoV-2 by FDA under an Emergency Use Authorization (EUA). This EUA will remain  in effect (meaning this test can be used) for the duration of the COVID-19 declaration under Section 564(b)(1) of the Act, 21 U.S.C.section 360bbb-3(b)(1), unless the authorization is terminated  or revoked sooner.       Influenza A by PCR NEGATIVE NEGATIVE Final   Influenza B by PCR NEGATIVE NEGATIVE Final    Comment: (NOTE) The Xpert Xpress SARS-CoV-2/FLU/RSV plus assay is intended as an aid in the diagnosis of influenza from Nasopharyngeal swab specimens and should not be used as a sole basis for treatment. Nasal washings and aspirates are unacceptable for Xpert Xpress SARS-CoV-2/FLU/RSV testing.  Fact Sheet for Patients: EntrepreneurPulse.com.au  Fact Sheet for Healthcare Providers: IncredibleEmployment.be  This test is not yet approved or cleared by the [place] FDA and has been authorized for detection and/or diagnosis of SARS-CoV-2 by FDA under an Emergency Use Authorization (EUA). This EUA will remain in effect  (meaning this test can be used) for the duration of the COVID-19 declaration under Section 564(b)(1) of the Act, 21 U.S.C. section 360bbb-3(b)(1), unless the authorization is terminated or revoked.  Performed at [facility], [address], [place],  [address]   Blood Culture (routine x 2)     Status: Abnormal (Preliminary result)   Collection Time: 03/20/21 12:56 PM   Specimen: BLOOD RIGHT FOREARM  Result Value Ref Range Status   Specimen Description BLOOD RIGHT FOREARM  Final   Special Requests   Final    BOTTLES DRAWN AEROBIC AND ANAEROBIC Blood Culture results may not be optimal due to an inadequate volume of blood received in culture bottles   Culture  Setup Time   Final    GRAM POSITIVE COCCI IN CLUSTERS IN BOTH AEROBIC  AND ANAEROBIC BOTTLES Organism ID to follow CRITICAL RESULT CALLED TO, READ BACK BY AND VERIFIED WITH: PHARMD ALEX LAWLESS 03/21/21'@1143'  BY TW    Culture (A)  Final    STAPHYLOCOCCUS AUREUS SUSCEPTIBILITIES TO FOLLOW Performed at [facility], [place] [address] [place], [place] [address]    Report Status PENDING  Incomplete  Blood Culture ID Panel (Reflexed)     Status: Abnormal   Collection Time: 03/20/21 12:56 PM  Result Value Ref Range Status   Enterococcus faecalis NOT DETECTED NOT DETECTED Final   Enterococcus Faecium NOT DETECTED NOT DETECTED Final   Listeria monocytogenes NOT DETECTED NOT DETECTED Final   Staphylococcus species DETECTED (A) NOT DETECTED Final    Comment: CRITICAL RESULT CALLED TO, READ BACK BY AND VERIFIED WITH: PHARMD ALEX LAWLESS 03/21/21'@1143'  BY TW    Staphylococcus aureus (BCID) DETECTED (A) NOT DETECTED Final    Comment: CRITICAL RESULT CALLED TO, READ BACK BY AND VERIFIED WITH: PHARMD ALEX LAWLESS 03/21/21'@11423'  BY TW    Staphylococcus epidermidis NOT DETECTED NOT DETECTED Final   Staphylococcus lugdunensis NOT DETECTED NOT DETECTED Final   Streptococcus species NOT DETECTED NOT DETECTED Final   Streptococcus  agalactiae NOT DETECTED NOT DETECTED Final   Streptococcus pneumoniae NOT DETECTED NOT DETECTED Final   Streptococcus pyogenes NOT DETECTED NOT DETECTED Final   A.calcoaceticus-baumannii NOT DETECTED NOT DETECTED Final   Bacteroides fragilis NOT DETECTED NOT DETECTED Final   Enterobacterales NOT DETECTED NOT DETECTED Final   Enterobacter cloacae complex NOT DETECTED NOT DETECTED Final   Escherichia coli NOT DETECTED NOT DETECTED Final   Klebsiella aerogenes NOT DETECTED NOT DETECTED Final   Klebsiella oxytoca NOT DETECTED NOT DETECTED Final   Klebsiella pneumoniae NOT DETECTED NOT DETECTED Final   Proteus species NOT DETECTED NOT DETECTED Final   Salmonella species NOT DETECTED NOT DETECTED Final   Serratia marcescens NOT DETECTED NOT DETECTED Final   Haemophilus influenzae NOT DETECTED NOT DETECTED Final   Neisseria meningitidis NOT DETECTED NOT DETECTED Final   Pseudomonas aeruginosa NOT DETECTED NOT DETECTED Final   Stenotrophomonas maltophilia NOT DETECTED NOT DETECTED Final   Candida albicans NOT DETECTED NOT DETECTED Final   Candida auris NOT DETECTED NOT DETECTED Final   Candida glabrata NOT DETECTED NOT DETECTED Final   Candida krusei NOT DETECTED NOT DETECTED Final   Candida parapsilosis NOT DETECTED NOT DETECTED Final   Candida tropicalis NOT DETECTED NOT DETECTED Final   Cryptococcus neoformans/gattii NOT DETECTED NOT DETECTED Final   Meth resistant mecA/C and MREJ NOT DETECTED NOT DETECTED Final    Comment: Performed at [facility], [address], [place], [place] [address]         Radiology Studies: CT HEAD WO CONTRAST (5MM)  Result Date: 03/20/2021 CLINICAL DATA:  Neurological deficit EXAM: CT HEAD WITHOUT CONTRAST TECHNIQUE: Contiguous axial images were obtained from the base of the skull through the vertex without intravenous contrast. COMPARISON:  CT head dated February 19, 2021 FINDINGS: Brain: No evidence of acute infarction, hemorrhage, hydrocephalus,  extra-axial collection or mass lesion/mass effect. Chronic white matter ischemic change. Old left basal ganglia lacunar infarcts. Vascular: No hyperdense vessel or unexpected calcification. Skull: Normal. Negative for fracture or focal lesion. Sinuses/Orbits: Fluid fluid level seen in the left sphenoid sinuses. Other: None. IMPRESSION: No acute intracranial abnormality. Fluid fluid level seen in the left sphenoid sinuses, findings can be seen in setting of sinusitis. Electronically Signed   By: Yetta Glassman MD   On: 03/20/2021 15:22  MR ANGIO HEAD WO CONTRAST  Result Date: 03/22/2021 CLINICAL DATA:  Stroke follow-up EXAM: MRA HEAD WITHOUT CONTRAST TECHNIQUE: Angiographic images of the Circle of Willis were acquired using MRA technique without intravenous contrast. COMPARISON:  No pertinent prior exam. FINDINGS: POSTERIOR CIRCULATION: --Vertebral arteries: Both are diminutive. There is loss of flow related enhancement of both vertebral arteries proximal to the vertebrobasilar confluence. --Inferior cerebellar arteries: Normal. --Basilar artery: Diminutive with diffuse irregularity. --Superior cerebellar arteries: Normal on the left. Multifocal stenosis of the right. --Posterior cerebral arteries: Both have fetal type origins from the posterior communicating arteries. Otherwise normal. ANTERIOR CIRCULATION: --Intracranial internal carotid arteries: The left ICA is occluded. Right ICA is unremarkable. --Anterior cerebral arteries (ACA): Normal. --Middle cerebral arteries (MCA): Normal. ANATOMIC VARIANTS: Fetal origins of both PCAs IMPRESSION: 1. 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 2. 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 3. Diminutive and irregular basilar artery in the context of bilateral fetal type origins of the posterior cerebral arteries. Electronically Signed   By: Ulyses Jarred M.D.   On: 03/22/2021 03:36    MR BRAIN WO CONTRAST  Result Date: 03/20/2021 CLINICAL DATA:  Transient ischemic attack. History of multiple myeloma. EXAM: MRI HEAD WITHOUT CONTRAST TECHNIQUE: Multiplanar, multiecho pulse sequences of the brain and surrounding structures were obtained without intravenous contrast. COMPARISON:  01/10/2020 FINDINGS: Brain: Multifocal acute ischemia in a watershed distribution in the left hemisphere. No contralateral acute ischemia. No acute hemorrhage. No chronic microhemorrhage. 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 Mild cytotoxic edema associated with the acute insults. The midline structures are normal. Vascular: Major flow voids are preserved. Skull and upper cervical spine: Normal calvarium and skull base. Visualized upper cervical spine and soft tissues are normal. Sinuses/Orbits:No paranasal sinus fluid levels or advanced mucosal thickening. No mastoid or middle ear effusion. Normal orbits. IMPRESSION: 1. Multifocal acute ischemia in a watershed distribution in the left hemisphere. No hemorrhage or mass effect. 2. 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 Electronically Signed   By: Ulyses Jarred M.D.   On: 03/20/2021 23:26   ECHOCARDIOGRAM COMPLETE BUBBLE STUDY  Result Date: 03/21/2021    ECHOCARDIOGRAM REPORT   Patient Name:   Scott Mcdowell Date of Exam: 03/21/2021 Medical Rec #:  [id number]                   Height:       75.0 in Accession #:    [id number]                  Weight:       167.3 lb Date of Birth:  September 24, 1940                   BSA:          2.034 m Patient Age:    58 years                    BP:           82/68 mmHg Patient Gender: M                           HR:           95 bpm. Exam Location:  Inpatient Procedure: 2D Echo, Cardiac Doppler and Color Doppler Indications:    Stroke  History:  Patient has prior history of Echocardiogram examinations, most                 recent 01/15/2019. COPD; Signs/Symptoms:Altered Mental  Status.  Sonographer:    Merrie Roof RDCS Referring Phys: [id number] ERIC CHEN  Sonographer Comments: Suboptimal apical window. Restricted mobility. No peripheral IV so no contrast and no bubble was performed at this time IMPRESSIONS  1. Left ventricular ejection fraction, by estimation, is 45 to 50%. The left ventricle has mildly decreased function. The left ventricle demonstrates regional wall motion abnormalities (see scoring diagram/findings for description). There is mild left ventricular hypertrophy. Left ventricular diastolic parameters are indeterminate.  2. Right ventricular systolic function is normal. The right ventricular size is normal.  3. Left atrial size was mildly dilated.  4. The mitral valve is normal in structure. Moderate mitral valve regurgitation. No evidence of mitral stenosis.  5. Tricuspid valve regurgitation is moderate.  6. The aortic valve is tricuspid. There is moderate calcification of the aortic valve. There is moderate thickening of the aortic valve. Aortic valve regurgitation is not visualized. Mild to moderate aortic valve sclerosis/calcification is present, without any evidence of aortic stenosis.  7. The inferior vena cava is normal in size with greater than 50% respiratory variability, suggesting right atrial pressure of 3 mmHg. Conclusion(s)/Recommendation(s): No intracardiac source of embolism detected on this transthoracic study. A transesophageal echocardiogram is recommended to exclude cardiac source of embolism if clinically indicated. FINDINGS  Left Ventricle: Left ventricular ejection fraction, by estimation, is 45 to 50%. The left ventricle has mildly decreased function. The left ventricle demonstrates regional wall motion abnormalities. The left ventricular internal cavity size was normal in size. There is mild left ventricular hypertrophy. Left ventricular diastolic parameters are indeterminate.  LV Wall Scoring: The apical anterior segment and apex are akinetic. The mid  anterolateral segment and mid anterior segment are hypokinetic. Right Ventricle: The right ventricular size is normal. No increase in right ventricular wall thickness. Right ventricular systolic function is normal. Left Atrium: Left atrial size was mildly dilated. Right Atrium: Right atrial size was normal in size. Pericardium: There is no evidence of pericardial effusion. Mitral Valve: The mitral valve is normal in structure. Moderate mitral valve regurgitation. No evidence of mitral valve stenosis. Tricuspid Valve: The tricuspid valve is normal in structure. Tricuspid valve regurgitation is moderate . No evidence of tricuspid stenosis. Aortic Valve: The aortic valve is tricuspid. There is moderate calcification of the aortic valve. There is moderate thickening of the aortic valve. Aortic valve regurgitation is not visualized. Mild to moderate aortic valve sclerosis/calcification is present, without any evidence of aortic stenosis. Pulmonic Valve: The pulmonic valve was normal in structure. Pulmonic valve regurgitation is not visualized. No evidence of pulmonic stenosis. Aorta: The aortic root is normal in size and structure. Venous: The inferior vena cava is normal in size with greater than 50% respiratory variability, suggesting right atrial pressure of 3 mmHg. IAS/Shunts: No atrial level shunt detected by color flow Doppler.  LEFT VENTRICLE PLAX 2D LVIDd:         4.80 cm LVIDs:         3.60 cm LV PW:         1.20 cm LV IVS:        1.00 cm LVOT diam:     2.00 cm LV SV:         43 LV SV Index:   21 LVOT Area:     3.14 cm  RIGHT VENTRICLE RV  Basal diam:  3.40 cm LEFT ATRIUM             Index       RIGHT ATRIUM           Index LA diam:        5.00 cm 2.46 cm/m  RA Area:     17.10 cm LA Vol (A2C):   72.5 ml 35.64 ml/m RA Volume:   42.40 ml  20.84 ml/m LA Vol (A4C):   82.5 ml 40.56 ml/m LA Biplane Vol: 84.2 ml 41.39 ml/m  AORTIC VALVE LVOT Vmax:   82.80 cm/s LVOT Vmean:  54.500 cm/s LVOT VTI:    0.137 m  AORTA  Ao Root diam: 3.30 cm MITRAL VALVE MV Area (PHT): 7.02 cm    SHUNTS MV Decel Time: 108 msec    Systemic VTI:  0.14 m MV E velocity: 81.80 cm/s  Systemic Diam: 2.00 cm MV A velocity: 70.70 cm/s MV E/A ratio:  1.16 Candee Furbish MD Electronically signed by Candee Furbish MD Signature Date/Time: 03/21/2021/6:35:14 PM    Final    VAS US CAROTID  Result Date: 03/21/2021 Carotid Arterial Duplex Study Patient Name:  CAUY MELODY [place]  Date of Exam:   03/21/2021 Medical Rec #: [id number]                    Accession #:    [id number] Date of Birth: [birth year]                    Patient Gender: M Patient Age:   80Y Exam Location:  [facility] Procedure:      VAS US CAROTID Referring Phys: [id number] ERIC CHEN --------------------------------------------------------------------------------  Indications:       Speech disturbance. Risk Factors:      Past history of smoking, prior CVA. 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 Comparison Study:  No previous exams Performing Technologist: Jody Hill RVT, RDMS  Examination Guidelines: A complete evaluation includes B-mode imaging, spectral Doppler, color Doppler, and power Doppler as needed of all accessible portions of each vessel. Bilateral testing is considered an integral part of a complete examination. Limited examinations for reoccurring indications may be performed as noted.  Right Carotid Findings: +----------+-------+--------+--------+-----------------------+-----------------+           PSV    EDV cm/sStenosisPlaque Description     Comments                    cm/s                                                            +----------+-------+--------+--------+-----------------------+-----------------+ CCA Prox  57     18              heterogenous           intimal  thickening         +----------+-------+--------+--------+-----------------------+-----------------+ CCA Distal44     12              heterogenous and       intimal                                            calcific               thickening        +----------+-------+--------+--------+-----------------------+-----------------+ ICA Prox  85     29      1-39%   calcific and irregular                   +----------+-------+--------+--------+-----------------------+-----------------+ ICA Distal56     19                                                       +----------+-------+--------+--------+-----------------------+-----------------+ ECA       56     0               irregular and calcific                   +----------+-------+--------+--------+-----------------------+-----------------+ +----------+--------+-------+----------------+-------------------+           PSV cm/sEDV cmsDescribe        Arm Pressure (mmHG) +----------+--------+-------+----------------+-------------------+ Subclavian30             Multiphasic, WNL                    +----------+--------+-------+----------------+-------------------+ +---------+--------+--+--------+-+---------+ VertebralPSV cm/s21EDV cm/s8Antegrade +---------+--------+--+--------+-+---------+  Left Carotid Findings: +----------+--------+--------+--------+---------------------+------------------+           PSV cm/sEDV cm/sStenosisPlaque Description   Comments           +----------+--------+--------+--------+---------------------+------------------+ CCA Prox  30      0               calcific             intimal thickening +----------+--------+--------+--------+---------------------+------------------+ CCA Distal27      0               calcific and         intimal thickening                                   heterogenous                             +----------+--------+--------+--------+---------------------+------------------+ ICA Prox  269     120     80-99%  diffuse, calcific and                                                     heterogenous                            +----------+--------+--------+--------+---------------------+------------------+ ICA Mid   41  21                                                      +----------+--------+--------+--------+---------------------+------------------+ ICA Distal35      17                                                      +----------+--------+--------+--------+---------------------+------------------+ ECA       95      0               calcific                                +----------+--------+--------+--------+---------------------+------------------+ +----------+--------+--------+------------------+-------------------+           PSV cm/sEDV cm/sDescribe          Arm Pressure (mmHG) +----------+--------+--------+------------------+-------------------+ Subclavian100             Abnormal (LUE AVF)                    +----------+--------+--------+------------------+-------------------+ +---------+--------+--+--------+-+--------------------------------------------+ VertebralPSV cm/s23EDV cm/s0High resistant and with loss of diastolic                                flow                                         +---------+--------+--+--------+-+--------------------------------------------+   Summary: Right Carotid: Velocities in the right ICA are consistent with a 1-39% stenosis.                Non-hemodynamically significant plaque <50% noted in the CCA. The                ECA appears <50% stenosed. Left Carotid: Velocities in the left ICA are consistent with a 80-99% stenosis.               Non-hemodynamically significant plaque <50% noted in the CCA. The               ECA appears <50% stenosed. Vertebrals:  Right vertebral artery demonstrates  antegrade flow. Left vertebral              artery demonstrates high resistant flow with loss of diastolic              flow. Subclavians: Normal flow hemodynamics were seen in the right subclavian artery.              Abnormal flow seen in left subclavian artery (LUE AVF). *See table(s) above for measurements and observations.  Vascular consult recommended. Electronically signed by Harold Barban MD on 03/21/2021 at 11:54:15 AM.    Final         Scheduled Meds:  Chlorhexidine Gluconate Cloth  6 each Topical Daily   collagenase   Topical Daily   darbepoetin (ARANESP) injection - NON-DIALYSIS  40 mcg Subcutaneous Q Thu-1800   ferrous sulfate  325 mg Oral Q breakfast   midodrine  10 mg Oral BID  WC   pantoprazole (PROTONIX) IV  40 mg Intravenous Q24H   thiamine injection  100 mg Intravenous Daily   Or   thiamine  100 mg Oral Daily   vitamin B-12  1,000 mcg Oral Daily   Continuous Infusions:  sodium chloride     sodium chloride 75 mL/hr at 03/22/21 0744    ceFAZolin (ANCEF) IV 2 g (03/22/21 0921)   metronidazole 500 mg (03/22/21 1139)     LOS: 2 days    Time spent:  22mn  PDomenic Polite MD [facility]   03/22/2021, 2:20 PM

## 2021-03-22 NOTE — Evaluation (Signed)
Occupational Therapy Evaluation Patient Details Name: Scott Mcdowell MRN: [id number] DOB: 02/01/1941 Today's Date: 03/22/2021    History of Present Illness Pt is an 80 year old man admitted on 03/20/21 with R ankle wound and MSSA bacteremia. 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   Clinical Impression   Pt has been bed bound x 2 months, daughter reports this coincided with discontinuation of home health PT. Family lifts pt  with hoyer lift to his w/c, but not as often as they would like as it requires two of his children to transfer him. Pt can typically self feed and use a urinal at bed level, he is otherwise dependent. Pt could benefit from prevalon boots and mattress overlay as he is prone to skin breakdown and has limited ability to reposition himself.     Follow Up Recommendations  No OT follow up    Equipment Recommendations   (mattress overlay for pressure relief)    Recommendations for Other Services       Precautions / Restrictions Precautions Precautions: Fall      Mobility Bed Mobility               General bed mobility comments: dependent    Transfers                 General transfer comment: lift equipment dependent    Balance                                           ADL either performed or assessed with clinical judgement   ADL                                         General ADL Comments: demonstrated ability to bring cup with straw to mouth, he otherwise dependent     Vision Patient Visual Report: No change from baseline       Perception     Praxis      Pertinent Vitals/Pain Pain Assessment: No/denies pain     Hand Dominance     Extremity/Trunk Assessment Upper Extremity Assessment Upper Extremity Assessment: RUE deficits/detail RUE Deficits / Details:  long standing hemiparesis, family does PROM and monitors skin integrity RUE Coordination: decreased fine motor;decreased gross motor   Lower Extremity Assessment Lower Extremity Assessment: Defer to PT evaluation   Cervical / Trunk Assessment Cervical / Trunk Assessment: Other exceptions Cervical / Trunk Exceptions: weakness, tendency to maintain neck in R lateral flexion and rotation   Communication Communication Communication: Expressive difficulties   Cognition Arousal/Alertness: Awake/alert Behavior During Therapy: Flat affect Overall Cognitive Status: Difficult to assess                                 General Comments: nods head yes and no, some verbalizations   General Comments       Exercises     Shoulder Instructions      Home Living Family/patient expects to be discharged to:: Private residence Living Arrangements: Spouse/significant other Available Help at Discharge: Family;Available 24 hours/day Type of Home: House Home Access: Ramped entrance  Home Layout: Two level;Able to live on main level with bedroom/bathroom               Home Equipment: Hospital bed;Other (comment);Wheelchair - manual (hoyer lift)      Lives With: Spouse    Prior Functioning/Environment Level of Independence: Needs assistance  Gait / Transfers Assistance Needed: bedbound x 2 months, coinciding with d/c of HHPT ADL's / Homemaking Assistance Needed: self feeds with set up, can use urinal at bed level            OT Problem List: Decreased strength;Decreased coordination;Impaired UE functional use      OT Treatment/Interventions: Self-care/ADL training;Patient/family education    OT Goals(Current goals can be found in the care plan section) Acute Rehab OT Goals Patient Stated Goal: maintain quality of life OT Goal Formulation: With patient/family Time For Goal Achievement: 04/05/21 Potential to Achieve Goals: Good ADL Goals Pt Will Perform Eating:  with min assist;sitting Additional ADL Goal #1: Pt will tolerate OOB with maximove x 1 hour in chair.  OT Frequency: Min 2X/week   Barriers to D/C:            Co-evaluation              AM-PAC OT "6 Clicks" Daily Activity     Outcome Measure Help from another person eating meals?: A Lot Help from another person taking care of personal grooming?: A Lot Help from another person toileting, which includes using toliet, bedpan, or urinal?: Total Help from another person bathing (including washing, rinsing, drying)?: Total Help from another person to put on and taking off regular upper body clothing?: Total Help from another person to put on and taking off regular lower body clothing?: Total 6 Click Score: 8   End of Session Nurse Communication: Other (comment) (daughter does not want BP taken in R UE)  Activity Tolerance: Patient tolerated treatment well Patient left: in bed;with call bell/phone within reach;with family/visitor present;with bed alarm set  OT Visit Diagnosis: Hemiplegia and hemiparesis;Muscle weakness (generalized) (M62.81) Hemiplegia - Right/Left: Right Hemiplegia - caused by: Cerebral infarction                Time: 1330-1400 OT Time Calculation (min): 30 min Charges:  OT General Charges $OT Visit: 1 Visit OT Evaluation $OT Eval Moderate Complexity: 1 Mod OT Treatments $Self Care/Home Management : 8-22 mins  Nestor Lewandowsky, OTR/L Acute Rehabilitation Services Pager: [phone] Office: [phone]  Malka So 03/22/2021, 2:20 PM

## 2021-03-22 NOTE — Progress Notes (Signed)
Palliative-   Thank you fort his consult. Chart reviewed. Called patient's daughter to set up time for meeting.  Patient's spouse wants to be involved in meeting, however, she has the flu and is not feeling well today. Daughter does not wish to meet with PMT without patient's spouse present.  Contact information given to daughter and she will call when family is ready.   Mariana Kaufman, AGNP-C Palliative Medicine  No charge

## 2021-03-22 NOTE — Evaluation (Signed)
Physical Therapy Evaluation Patient Details Name: Scott Mcdowell MRN: [id number] DOB: [birth year] Today's Date: 03/22/2021   History of Present Illness  Pt is an 80 year old man admitted on 03/20/21 with R ankle wound and MSSA bacteremia. 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  Clinical Impression  Pt presents to PT with limited mobility and requiring a hoyer lift to the w/c at home for last several months. Daughter reports that when pt was active with PT he was able to amb 64' with assist. Mobility has declined since PT ended. Recommend HHPT after DC and we will see him while he is here.     Follow Up Recommendations Home health PT    Equipment Recommendations  None recommended by PT    Recommendations for Other Services       Precautions / Restrictions Precautions Precautions: Fall      Mobility  Bed Mobility Overal bed mobility: Needs Assistance Bed Mobility: Supine to Sit;Sit to Sidelying     Supine to sit: Max assist   Sit to sidelying: Max assist General bed mobility comments: Assist to bring RLE off of bed, elevate trunk into sitting and bring hips to EOB. Assist to lower trunk and bring RLE back up into the bed    Transfers                 General transfer comment: Did not attempt. Pt with use of hoyer lift at home >2 months  Ambulation/Gait                Stairs            Wheelchair Mobility    Modified Rankin (Stroke Patients Only)       Balance Overall balance assessment: Needs assistance Sitting-balance support: Single extremity supported;Feet supported Sitting balance-Leahy Scale: Poor Sitting balance - Comments: UE support. Sat x 15 minutes with min guard.                                     Pertinent Vitals/Pain Pain Assessment: No/denies pain    Home Living  Family/patient expects to be discharged to:: Private residence Living Arrangements: Spouse/significant other Available Help at Discharge: Family;Available 24 hours/day Type of Home: House Home Access: Ramped entrance     Home Layout: Two level;Able to live on main level with bedroom/bathroom Home Equipment: Hospital bed;Other (comment);Wheelchair - manual (hoyer lift)      Prior Function Level of Independence: Needs assistance   Gait / Transfers Assistance Needed: bedbound x 2 months, coinciding with d/c of HHPT. Was amb 26' several months ago  ADL's / Homemaking Assistance Needed: self feeds with set up, can use urinal at bed level        Hand Dominance        Extremity/Trunk Assessment   Upper Extremity Assessment Upper Extremity Assessment: Defer to OT evaluation RUE Deficits / Details: long standing hemiparesis, family does PROM and monitors skin integrity RUE Coordination: decreased fine motor;decreased gross motor    Lower Extremity Assessment Lower Extremity Assessment: Generalized weakness;RLE deficits/detail RLE Deficits / Details: Pt with longstanding hemiparesis. Knee flexion contracture ~10 degrees after passive stretch. Some active movement at hip    Cervical / Trunk Assessment Cervical / Trunk Assessment: Other exceptions Cervical / Trunk Exceptions: weakness, tendency  to maintain neck in R lateral flexion and rotation  Communication   Communication: Expressive difficulties  Cognition Arousal/Alertness: Awake/alert Behavior During Therapy: Flat affect Overall Cognitive Status: Difficult to assess                                 General Comments: nods head yes and no, some verbalizations      General Comments      Exercises     Assessment/Plan    PT Assessment Patient needs continued PT services  PT Problem List Decreased strength;Decreased balance;Decreased range of motion;Decreased mobility;Decreased activity tolerance        PT Treatment Interventions DME instruction;Functional mobility training;Balance training;Patient/family education;Therapeutic activities;Neuromuscular re-education;Therapeutic exercise    PT Goals (Current goals can be found in the Care Plan section)  Acute Rehab PT Goals Patient Stated Goal: maintain quality of life PT Goal Formulation: With patient/family Time For Goal Achievement: 04/05/21 Potential to Achieve Goals: Fair    Frequency Min 2X/week   Barriers to discharge        Co-evaluation               AM-PAC PT "6 Clicks" Mobility  Outcome Measure Help needed turning from your back to your side while in a flat bed without using bedrails?: Total Help needed moving from lying on your back to sitting on the side of a flat bed without using bedrails?: A Lot Help needed moving to and from a bed to a chair (including a wheelchair)?: Total Help needed standing up from a chair using your arms (e.g., wheelchair or bedside chair)?: Total Help needed to walk in hospital room?: Total Help needed climbing 3-5 steps with a railing? : Total 6 Click Score: 7    End of Session   Activity Tolerance: Patient tolerated treatment well Patient left: in bed;with call bell/phone within reach;with bed alarm set;with family/visitor present   PT Visit Diagnosis: Other abnormalities of gait and mobility (R26.89);Muscle weakness (generalized) (M62.81);Hemiplegia and hemiparesis Hemiplegia - Right/Left: Right Hemiplegia - dominant/non-dominant: Dominant Hemiplegia - caused by: Cerebral infarction    Time: 6283-1517 PT Time Calculation (min) (ACUTE ONLY): 30 min   Charges:   PT Evaluation $PT Eval Moderate Complexity: 1 Mod PT Treatments $Therapeutic Activity: 8-22 mins        [facility] PT Acute Rehabilitation Services Pager [phone] Office [place] 03/22/2021, 4:44 PM

## 2021-03-22 NOTE — Evaluation (Signed)
Speech Language Pathology Evaluation Patient Details Name: Scott Mcdowell MRN: [id number] DOB: November 24, 1940 Today's Date: 03/22/2021 Time: 4944-9675 SLP Time Calculation (min) (ACUTE ONLY): 20 min  Problem List:  Patient Active Problem List   Diagnosis Date Noted   Acute on chronic anemia 03/20/2021   Hypotension due to hypovolemia 03/20/2021   Dysphagia 03/20/2021   Expressive aphasia 03/20/2021   Anemia in chronic kidney disease 01/01/2021   Multiple myeloma not having achieved remission ([place]) 06/26/2020   Hyperglycemia    Hemodialysis-associated hypotension    Peripheral edema    Labile blood pressure    Orthostatic hypotension    Steroid-induced hyperglycemia    History of supraventricular tachycardia    ESRD on dialysis 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   Shock ([place]) 01/21/2019   Acute blood loss anemia 01/21/2019   Upper GI bleed 01/21/2019   B12 deficiency anemia 01/15/2019   Pleural effusion on right 01/15/2019   Ascites 01/15/2019   CAP (community acquired pneumonia) 01/15/2019   Elevated troponin 01/15/2019   Hypercalcemia 01/14/2019   ARF (acute renal failure) ([place]) 01/14/2019   Hyperkalemia 01/14/2019   Past Medical History:  Past Medical History:  Diagnosis Date   Goals of care, counseling/discussion 01/29/2019   HTN (hypertension)    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 PCCM intubating patient and he will be ready for EGD by 10: 30-10:40am   ESOPHAGOGASTRODUODENOSCOPY (EGD) WITH PROPOFOL N/A 01/31/2019   Procedure: ESOPHAGOGASTRODUODENOSCOPY (EGD) WITH PROPOFOL;  Surgeon: Juanita Craver, MD;  Location: [facility] ENDOSCOPY;  Service: Endoscopy;  Laterality: N/A;   HEMOSTASIS CLIP PLACEMENT  01/21/2019   Procedure: HEMOSTASIS CLIP PLACEMENT;  Surgeon: Yetta Flock, MD;  Location: [facility] ENDOSCOPY;  Service: Gastroenterology;;   HEMOSTASIS CONTROL  01/21/2019   Procedure: HEMOSTASIS CONTROL;  Surgeon: Yetta Flock, MD;  Location: [facility] ENDOSCOPY;  Service: Gastroenterology;;  [place] Rm Probe   INSERTION OF DIALYSIS CATHETER Right 02/05/2019   Procedure: INSERTION OF DIALYSIS CATHETER RIGHT INTERNAL JUGULAR;  Surgeon: Elam Dutch, MD;  Location: [place];  Service: Vascular;  Laterality: Right;   IR FLUORO GUIDE CV LINE RIGHT  01/20/2019   IR US GUIDE VASC ACCESS RIGHT  01/20/2019   SCLEROTHERAPY  01/21/2019   Procedure: Clide Deutscher;  Surgeon: Yetta Flock, MD;  Location: [facility] ENDOSCOPY;  Service: Gastroenterology;;   HPI:  Pt is  an 80 y/o male who presented to the ED secondary to right ankle wound and impaired communication with reduced "cohesive speech". MRI brain 8/2: Multifocal acute ischemia in a watershed distribution in the left hemisphere. CXR 8/2 negative. Per EMR, pt's wife "noted pt having issues with swallowing for several weeks now. Wife has stated she  need to grind up his meats and give thickened liquids in order to prevent pt from choking." PMH: multiple myeloma, CKD stage IV, CVA, history of chronic anticoagulation due to right upper extremity DVT in 2020, chronic debility.   Assessment / Plan / Recommendation Clinical Impression  Pt presents with moderate aphasia characterized by impairments in receptive and expressive language with more notable difficulty with verbal expression. He was able to produce some phrases, including, but not limited to, "What about that?", "I can't remember", and "I have no idea". However, he achieved 0% accuracy during all structured naming tasks. Neologistic errors and phonemic paraphasias were noted during these tasks and pt was inconsistently responsive to phonemic cues. Pt was able to follow some 1-step commands and respond appropriately to some simple yes/no questions, but he demonstrated increased difficulty with auditory comprehension beyond these levels. Motor speech assessment was difficult due to pt's limited verbal output, but articulatory precision appeared at least functional for speech intelligibility. Cognitive-linguistic assessment was deferred due to pt's language deficits. Skilled SLP services are clinically indicated at this time to improve language.    SLP Assessment  SLP Recommendation/Assessment: Patient needs continued Speech Lanaguage Pathology Services SLP Visit Diagnosis: Dysphagia, unspecified (R13.10)    Follow Up Recommendations  None    Frequency and Duration min 2x/week  2 weeks      SLP Evaluation Cognition  Overall Cognitive Status:  (diufficult to assess)       Comprehension  Auditory Comprehension Overall Auditory Comprehension: Impaired Yes/No Questions: Impaired Basic Biographical Questions:  (1/5) Commands: Impaired One Step Basic Commands:  (3/5) Two Step Basic Commands:  (0/4) Conversation: Simple    Expression Expression Primary Mode of Expression:  Verbal Verbal Expression Overall Verbal Expression: Impaired Initiation: Impaired Automatic Speech: Day of week;Counting (Counting: 0/10; days: 0/7) Level of Generative/Spontaneous Verbalization: Phrase Repetition: Impaired Level of Impairment: Word level;Phrase level Naming: Impairment Responsive: Not tested Confrontation:  (0/5) Verbal Errors: Neologisms;Phonemic paraphasias   Oral / Motor  Oral Motor/Sensory Function Overall Oral Motor/Sensory Function: Within functional limits Motor Speech Overall Motor Speech: Appears within functional limits for tasks assessed (Though output is limited)   Scott Mcdowell I. Hardin Negus, [place], [place] Office number [phone] Pager [phone]                    Scott Mcdowell 03/22/2021, 10:02 AM

## 2021-03-22 NOTE — Progress Notes (Addendum)
STROKE TEAM PROGRESS NOTE    Interval History   No acute events overnight, patient is resting in bed. No family at bedside.  He is globally aphasic but mutters a few words and cannot answer his name at times speech is nonsensical.  He is spastic right hemiplegia  Pertinent Lab Work and Imaging    03/20/21 CT Head WO IV Contrast No acute intracranial abnormality. Fluid fluid level seen in the left sphenoid sinuses, findings can be seen in setting of sinusitis.  03/20/21 MRI Brain WO Contrast  1. Multifocal acute ischemia in a watershed distribution in the left hemisphere. No hemorrhage or mass effect. 2. 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  03/21/21 VAS US Carotid  Right Carotid: Velocities in the right ICA are consistent with a 1-39% stenosis. Non-hemodynamically significant plaque <50% noted in the CCA. The ECA appears <50% stenosed.   Left Carotid: Velocities in the left ICA are consistent with a 80-99% stenosis.  Non-hemodynamically significant plaque <50% noted in the CCA. The ECA appears <50% stenosed.   Vertebrals:  Right vertebral artery demonstrates antegrade flow. Left vertebral  artery demonstrates high resistant flow with loss of diastolic flow.   Subclavians: Normal flow hemodynamics were seen in the right subclavian artery. Abnormal flow seen in left subclavian artery   03/21/21 Echocardiogram Complete   1. Left ventricular ejection fraction, by estimation, is 45 to 50%. The left ventricle has mildly decreased function. The left ventricle demonstrates regional wall motion abnormalities (see scoring diagram/findings for description). There is mild left ventricular hypertrophy. Left ventricular diastolic parameters are  indeterminate.   2. Right ventricular systolic function is normal. The right ventricular size is normal.   3. Left atrial size was mildly dilated.   4. The mitral valve is normal in structure. Moderate mitral valve regurgitation. No  evidence of mitral stenosis.   5. Tricuspid valve regurgitation is moderate.   6. The aortic valve is tricuspid. There is moderate calcification of the aortic valve. There is moderate thickening of the aortic valve. Aortic valve regurgitation is not visualized. Mild to moderate aortic valve sclerosis/calcification is present, without any evidence of aortic stenosis.   7. The inferior vena cava is normal in size with greater than 50% respiratory variability, suggesting right atrial pressure of 3 mmHg.   03/21/21 MRA Head WO Contrast  1. 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 2. 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 3. Diminutive and irregular basilar artery in the context of bilateral fetal type origins of the posterior cerebral arteries.  Physical Examination   Constitutional: Calm, elderly male appropriate for condition  Cardiovascular: Normal RR Respiratory: No increased WOB   Mental status: Drowsy but arouses easily.  Globally aphasic oriented to self only, follows simple commands ( close eyes/open and stick out tongue), follows more complex commands with coaching ( like show two fingers)  Speech: Expressive aphasia with perseverating, nonsensical speech except occasional clear words Cranial nerves: EOMI, No BTT on the right, Right facial droop, Tongue midline  Motor: Normal bulk and tone. LUE antigravity. RUE is spastic and contracted. RLE spastic. LLE with withdrawal  Sensory: UTA  Coordination: UTA  Gait: Deferred due to weakness   Assessment and Plan   [name] is a 80 y.o. 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 At this time, stroke work up is complete. His MRI Brain showed multifocal acute ischemia in a watershed distribution in the left hemisphere. Echo w/EF 45 to 50 %, LA mildly dilated. MRA Head was pertinent for occlusion of the LICA at the skull base. Bilateral carotid ultrasound showed the LICA with a 23-76 % stenosis. Stroke labs w/LDL 41, hemoglobin a1C 5.3. Stroke etiology is due to hypoperfusion in the setting of known stenosis to the internal carotid artery- was notably hypotensive in the ER with SBP of 85/69.   He is very debilitated at baseline due to his  prior stroke and now has new stroke. Given poor quality of life with his current neurological deficits agree with palliative care consult.  - From a stroke stand point it is ok to resume anticoagulation however given anemia/GIB that is actively being managed at this time will defer resuming to the patients primary team  - LDL 41, at goal, no need for statin therapy  - Given palliative care has been consulted, please reach out if he needs stroke follow up to be arranged at discharge   #Hypertension He has a history of HTN. He is outside of the permissive hypertension window as sx started 3 days prior to admission. As there is risk of another stroke due to hypoperfusion in the setting of hypotension recommend maintaining SBP > 140.  #Hyperlipidemia From a stroke prevention stand point, the LDL goal is < 70. LDL is at goal at 41, no need for statin therapy.   #Stroke Dysphagia Screening #Dysphagia due to stroke  He has dysphagia due to his stroke. Evaluated by ST this admission and approved for an NDD3 diet.   #Stroke Diabetes Screening  Hemoglobin A1C this admission noted to be 5.3. At goal from a stroke standpoint as it is < 7.    Hospital day # 2  [name], NP  [facility] Nurse Practitioner Patient seen and discussed with attending physician Dr. Carilyn Goodpasture MD NOTE : I have personally obtained history,examined this patient, reviewed notes, independently viewed imaging studies, participated in medical decision making and plan of care.ROS completed by me personally and pertinent positives fully documented  I have made any additions or clarifications directly to the above note. Agree with note above.  Patient has baseline aphasia and spastic right hemiplegia from a previous left Temixys stroke with left carotid occlusion.  He presented with bacteremia and sepsis and hypotension with worsening of his deficits.  MRI scan shows watershed left hemispheric infarcts with chronic left carotid occlusion with very feeble collaterals.  Patient general medical condition is quite poor and I think palliative care approach and discussion with family about goals of care would be more appropriate.  I do not recommend further aggressive neurovascular work-up.  Aspirin if he is hemodynamically stable and not actively bleeding.  No family available for discussion.  Discussed with Dr. Broadus John.  Greater than 50% time during this 35-minute visit was spent on counseling and coordination of care about his stroke and carotid occlusion and discussion with care team and answering questions. Stroke team will sign off.  Kindly call for questions Antony Contras, MD Medical Director [facility] Pager: [phone] 03/22/2021 3:15 PM  To contact Stroke Continuity provider, please refer to [email or web address] After hours, contact General Neurology

## 2021-03-22 NOTE — Progress Notes (Signed)
  Speech Language Pathology Treatment: Dysphagia  Patient Details Name: Scott Mcdowell MRN: [id number] DOB: 1940-12-05 Today's Date: 03/22/2021 Time: 8127-5170 SLP Time Calculation (min) (ACUTE ONLY): 16 min  Assessment / Plan / Recommendation Clinical Impression  Pt was seen during breakfast for dysphagia treatment and he was cooperative throughout the session. Pt's daughter was contacted via phone and she indicated that the pt ate mashed potatoes and thin liquids yesterday without difficulty, but that he refused other foods during dinner. Pt consumed a meal of pancakes, pork sausage, and thin liquids via straw. Meds were also given whole with liquids by Alma Friendly, RN during the session. No s/sx of aspiration were noted with meds, solids or liquids. Mastication was functionally increased secondary to limited dentition. Mild residue was noted in the left lateral sulcus, but this was cleared with pt's independent use of lingual sweeps and secondary swallows or with a liquid wash. Pt's current diet of dysphagia 3 solids and thin liquids will be continued and SLP will follow pt.    HPI HPI: Pt is an 80 y/o male who presented to the ED secondary to right ankle wound and impaired communication with reduced "cohesive speech". MRI brain 8/2: Multifocal acute ischemia in a watershed distribution in the left hemisphere. CXR 8/2 negative. Per EMR, pt's wife "noted pt having issues with swallowing for several weeks now. 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      SLP Plan  Continue with current plan of care       Recommendations  Diet recommendations: Dysphagia 3 (mechanical soft);Thin liquid Liquids provided via: Cup;Straw Medication Administration: Whole meds with liquid Supervision:  Staff to assist with self feeding Compensations: Slow rate;Small sips/bites;Follow solids with liquid Postural Changes and/or Swallow Maneuvers: Seated upright 90 degrees                Oral Care Recommendations: Oral care BID Follow up Recommendations: None SLP Visit Diagnosis: Dysphagia, unspecified (R13.10) Plan: Continue with current plan of care       [name] I. Hardin Negus, [place], [place] Office number [phone] Pager [phone]                [name] 03/22/2021, 9:47 AM

## 2021-03-23 ENCOUNTER — Encounter (HOSPITAL_COMMUNITY): Payer: Self-pay | Admitting: Internal Medicine

## 2021-03-23 DIAGNOSIS — L89894 Pressure ulcer of other site, stage 4: Secondary | ICD-10-CM

## 2021-03-23 DIAGNOSIS — R7881 Bacteremia: Secondary | ICD-10-CM

## 2021-03-23 DIAGNOSIS — R6510 Systemic inflammatory response syndrome (SIRS) of non-infectious origin without acute organ dysfunction: Secondary | ICD-10-CM

## 2021-03-23 DIAGNOSIS — Z7189 Other specified counseling: Secondary | ICD-10-CM

## 2021-03-23 DIAGNOSIS — B9561 Methicillin susceptible Staphylococcus aureus infection as the cause of diseases classified elsewhere: Secondary | ICD-10-CM

## 2021-03-23 LAB — TYPE AND SCREEN
ABO/RH(D): O POS
Antibody Screen: NEGATIVE
Unit division: 0
Unit division: 0
Unit division: 0
Unit division: 0

## 2021-03-23 LAB — BPAM RBC
Blood Product Expiration Date: 202208112359
Blood Product Expiration Date: 202209012359
Blood Product Expiration Date: 202209012359
Blood Product Expiration Date: 202209012359
ISSUE DATE / TIME: 202208021529
ISSUE DATE / TIME: 202208021947
ISSUE DATE / TIME: 202208022352
ISSUE DATE / TIME: 202208041211
Unit Type and Rh: 5100
Unit Type and Rh: 5100
Unit Type and Rh: 5100
Unit Type and Rh: 5100

## 2021-03-23 LAB — BASIC METABOLIC PANEL
Anion gap: 9 (ref 5–15)
BUN: 41 mg/dL — ABNORMAL HIGH (ref 8–23)
CO2: 23 mmol/L (ref 22–32)
Calcium: 7.5 mg/dL — ABNORMAL LOW (ref 8.9–10.3)
Chloride: 105 mmol/L (ref 98–111)
Creatinine, Ser: 1.94 mg/dL — ABNORMAL HIGH (ref 0.61–1.24)
GFR, Estimated: 34 mL/min — ABNORMAL LOW (ref >60)
Glucose, Bld: 106 mg/dL — ABNORMAL HIGH (ref 70–99)
Potassium: 2.8 mmol/L — ABNORMAL LOW (ref 3.5–5.1)
Sodium: 137 mmol/L (ref 135–145)

## 2021-03-23 LAB — MULTIPLE MYELOMA PANEL, SERUM
Albumin SerPl Elph-Mcnc: 2.2 g/dL — ABNORMAL LOW (ref 2.9–4.4)
Albumin/Glob SerPl: 0.9 (ref 0.7–1.7)
Alpha 1: 0.4 g/dL (ref 0.0–0.4)
Alpha2 Glob SerPl Elph-Mcnc: 0.8 g/dL (ref 0.4–1.0)
B-Globulin SerPl Elph-Mcnc: 0.8 g/dL (ref 0.7–1.3)
Gamma Glob SerPl Elph-Mcnc: 0.7 g/dL (ref 0.4–1.8)
Globulin, Total: 2.7 g/dL (ref 2.2–3.9)
IgA: 251 mg/dL (ref 61–437)
IgG (Immunoglobin G), Serum: 684 mg/dL (ref 603–1613)
IgM (Immunoglobulin M), Srm: 22 mg/dL (ref 15–143)
Total Protein ELP: 4.9 g/dL — ABNORMAL LOW (ref 6.0–8.5)

## 2021-03-23 LAB — CULTURE, BLOOD (ROUTINE X 2)

## 2021-03-23 LAB — CBC
HCT: 24.4 % — ABNORMAL LOW (ref 39.0–52.0)
Hemoglobin: 8 g/dL — ABNORMAL LOW (ref 13.0–17.0)
MCH: 29.9 pg (ref 26.0–34.0)
MCHC: 32.8 g/dL (ref 30.0–36.0)
MCV: 91 fL (ref 80.0–100.0)
Platelets: 151 10*3/uL (ref 150–400)
RBC: 2.68 MIL/uL — ABNORMAL LOW (ref 4.22–5.81)
RDW: 18.2 % — ABNORMAL HIGH (ref 11.5–15.5)
WBC: 4.4 10*3/uL (ref 4.0–10.5)
nRBC: 0.7 % — ABNORMAL HIGH (ref 0.0–0.2)

## 2021-03-23 MED ORDER — MORPHINE SULFATE (PF) 2 MG/ML IV SOLN
2.0000 mg | Freq: Once | INTRAVENOUS | Status: AC
  Administered 2021-03-24: 2 mg via INTRAVENOUS
  Filled 2021-03-23: qty 1

## 2021-03-23 MED ORDER — POTASSIUM CHLORIDE 20 MEQ PO PACK
40.0000 meq | PACK | ORAL | Status: AC
  Administered 2021-03-23 (×2): 40 meq via ORAL
  Filled 2021-03-23: qty 2

## 2021-03-23 MED ORDER — ACETAMINOPHEN 325 MG PO TABS
650.0000 mg | ORAL_TABLET | Freq: Two times a day (BID) | ORAL | Status: DC
  Administered 2021-03-23 – 2021-04-03 (×20): 650 mg via ORAL
  Filled 2021-03-23 (×22): qty 2

## 2021-03-23 MED ORDER — POLYETHYLENE GLYCOL 3350 17 G PO PACK
17.0000 g | PACK | Freq: Every day | ORAL | Status: DC | PRN
  Filled 2021-03-23: qty 1

## 2021-03-23 MED ORDER — MIDODRINE HCL 5 MG PO TABS
5.0000 mg | ORAL_TABLET | Freq: Two times a day (BID) | ORAL | Status: DC
  Administered 2021-03-23 – 2021-03-24 (×3): 5 mg via ORAL
  Filled 2021-03-23 (×3): qty 1

## 2021-03-23 MED ORDER — SENNOSIDES-DOCUSATE SODIUM 8.6-50 MG PO TABS
2.0000 | ORAL_TABLET | Freq: Two times a day (BID) | ORAL | Status: DC
  Administered 2021-03-23 – 2021-04-03 (×21): 2 via ORAL
  Filled 2021-03-23 (×21): qty 2

## 2021-03-23 MED ORDER — GLYCERIN (LAXATIVE) 2.1 G RE SUPP
1.0000 | Freq: Once | RECTAL | Status: AC
  Administered 2021-03-23: 1 via RECTAL
  Filled 2021-03-23: qty 1

## 2021-03-23 NOTE — Plan of Care (Signed)
  Problem: Education: Goal: Knowledge of General Education information will improve Description Including pain rating scale, medication(s)/side effects and non-pharmacologic comfort measures Outcome: Progressing   Problem: Health Behavior/Discharge Planning: Goal: Ability to manage health-related needs will improve Outcome: Progressing   

## 2021-03-23 NOTE — Plan of Care (Signed)
  Problem: Education: Goal: Knowledge of General Education information will improve Description Including pain rating scale, medication(s)/side effects and non-pharmacologic comfort measures Outcome: Progressing   

## 2021-03-23 NOTE — H&P (Addendum)
Chief Complaint: Patient was seen in consultation today for port-a-catheter removal  at the request of Dr. Broadus John, P.   Referring Physician(s): Dr. Broadus John, P.   Supervising Physician: Ruthann Cancer  Patient Status: [facility]  History of Present Illness: Scott Mcdowell is a 80 y.o. 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  Blood culture was drawn on 03/20/2021 which finalized on 03/23/2021, showed Staph aureus.  ID was consulted, who recommended Port-A-Cath removal for line holiday.  IR was requested for Port-A-Cath removal. Case was reviewed and approved by Dr. Serafina Royals.  Patient was seen in the patient room. Patient laying in bed, not in acute distress.  Daughter at the bedside. Patient is oriented to self only. Patient reports headache and shortness of breath.  Denise fever, chills,  cough, chest pain, abdominal pain, nausea ,vomiting, and bleeding.   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 PCCM intubating patient and he will be ready for EGD by 10: 30-10:40am   ESOPHAGOGASTRODUODENOSCOPY (EGD) WITH PROPOFOL N/A 01/31/2019   Procedure:  ESOPHAGOGASTRODUODENOSCOPY (EGD) WITH PROPOFOL;  Surgeon: Juanita Craver, MD;  Location: [facility] ENDOSCOPY;  Service: Endoscopy;  Laterality: N/A;   HEMOSTASIS CLIP PLACEMENT  01/21/2019   Procedure: HEMOSTASIS CLIP PLACEMENT;  Surgeon: Yetta Flock, MD;  Location: [facility] ENDOSCOPY;  Service: Gastroenterology;;   HEMOSTASIS CONTROL  01/21/2019   Procedure: HEMOSTASIS CONTROL;  Surgeon: Yetta Flock, MD;  Location: [facility] ENDOSCOPY;  Service: Gastroenterology;;  Girtha Rm Probe   INSERTION OF DIALYSIS CATHETER Right 02/05/2019   Procedure: INSERTION OF DIALYSIS CATHETER RIGHT INTERNAL JUGULAR;  Surgeon: Elam Dutch, MD;  Location: [place];  Service: Vascular;  Laterality: Right;   IR FLUORO GUIDE CV LINE RIGHT  01/20/2019   IR US GUIDE VASC ACCESS RIGHT  01/20/2019   SCLEROTHERAPY  01/21/2019   Procedure: Clide Deutscher;  Surgeon: Yetta Flock, MD;  Location: [facility] ENDOSCOPY;  Service: Gastroenterology;;    Allergies: Patient has no known allergies.  Medications: Prior to Admission medications   Medication Sig Start Date End Date Taking? Authorizing Provider  apixaban (ELIQUIS) 5 MG TABS tablet Take 1 tablet (5 mg total) by mouth 2 (two) times daily. Patient taking differently: Take 2.5 mg by mouth 2 (two) times daily. 03/04/19  Yes Angiulli, Lavon Paganini, PA-C  atorvastatin (LIPITOR) 10 MG tablet Take 1 tablet (10 mg total) by mouth daily. 03/04/19  Yes Angiulli, Lavon Paganini, PA-C  calcitRIOL (ROCALTROL) 0.25 MCG capsule Take 1 capsule by mouth every other day. 01/10/21  Yes [provider]  dexamethasone (DECADRON) 4 MG tablet Take 1 tablet (4 mg total) by mouth daily. Take 5 tablets on the day of chemo 11/20/20  Yes Melodye Ped,  NP  furosemide (LASIX) 40 MG tablet Take 40 mg by mouth daily.   Yes [provider]  lenalidomide (REVLIMID) 15 MG capsule Take 15 mg by mouth daily. [employer] Auth #      Date Obtained    Yes [provider]  OVER THE COUNTER MEDICATION Take 1  tablet by mouth 2 (two) times daily. Citcrical 1200 mg tab. One in the Am one in the pm.   Yes [provider]  pantoprazole (PROTONIX) 40 MG tablet Take 1 tablet (40 mg total) by mouth 2 (two) times daily. 03/04/19  Yes Angiulli, Lavon Paganini, PA-C  traMADol (ULTRAM) 50 MG tablet Take 1 tablet by mouth every 12 (twelve) hours as needed for pain.   Yes [provider]  Darbepoetin Alfa (ARANESP) 200 MCG/0.4ML SOSY injection Inject 0.4 mLs (200 mcg total) into the vein every Tuesday with hemodialysis. Patient not taking: No sig reported 02/16/19   Mariel Aloe, MD  midodrine (PROAMATINE) 10 MG tablet Take 1 tablet (10 mg total) by mouth 3 (three) times daily with meals. Patient not taking: No sig reported 03/04/19   Angiulli, Lavon Paganini, PA-C  multivitamin (RENA-VIT) TABS tablet Take 1 tablet by mouth at bedtime. Patient not taking: No sig reported 03/04/19   Angiulli, Lavon Paganini, PA-C  polyethylene glycol (MIRALAX / GLYCOLAX) 17 g packet Take 17 g by mouth daily. Patient not taking: No sig reported 02/11/19   Mariel Aloe, MD  senna-docusate (SENOKOT-S) 8.6-50 MG tablet Take 1 tablet by mouth at bedtime. Patient not taking: No sig reported 02/10/19   Mariel Aloe, MD  sevelamer carbonate (RENVELA) 800 MG tablet Take 3 tablets (2,400 mg total) by mouth 3 (three) times daily with meals. Patient not taking: No sig reported 03/04/19   Angiulli, Lavon Paganini, PA-C     Family History  Problem Relation Age of Onset   Cancer Mother    Parkinson's disease Father     Social History   Socioeconomic History   Marital status: Married    Spouse name: Not on file   Number of children: Not on file   Years of education: Not on file   Highest education level: Not on file  Occupational History   Not on file  Tobacco Use   Smoking status: Former    Types: Cigarettes   Smokeless tobacco: Never  Substance and Sexual Activity   Alcohol use: Yes    Alcohol/week: 5.0 standard drinks    Types:  5 Cans of beer per week   Drug use: Not on file   Sexual activity: Not on file  Other Topics Concern   Not on file  Social History Narrative   Not on file   Social Determinants of Health   Financial Resource Strain: Not on file  Food Insecurity: Not on file  Transportation Needs: Not on file  Physical Activity: Not on file  Stress: Not on file  Social Connections: Not on file     Review of Systems: A 12 point ROS discussed and pertinent positives are indicated in the HPI above.  All other systems are negative.   Vital Signs: BP 125/63 (BP Location: Left Leg)   Pulse 75   Temp 97.7 F (36.5 C) (Axillary)   Resp 18   Wt 167 lb 5.3 oz (75.9 kg)   SpO2 98%   BMI 20.91 kg/m   Physical Exam Vitals reviewed.  Constitutional:      General: He is not in acute distress.  Appearance: He is ill-appearing.  HENT:     Head: Normocephalic and atraumatic.     Mouth/Throat:     Mouth: Mucous membranes are moist.     Comments: Missing teeth  Cardiovascular:     Rate and Rhythm: Normal rate and regular rhythm.     Pulses: Normal pulses.     Heart sounds: Normal heart sounds.  Pulmonary:     Effort: Pulmonary effort is normal.     Breath sounds: Normal breath sounds.  Abdominal:     General: Abdomen is flat.     Palpations: Abdomen is soft.  Skin:    General: Skin is warm and dry.     Coloration: Skin is not jaundiced or pale.  Neurological:     Mental Status: He is disoriented.     Comments: Oriented to self only, states that he is at the [facility]    MD Evaluation Airway: WNL Heart: WNL Abdomen: WNL Chest/ Lungs: WNL ASA  Classification: 3 Mallampati/Airway Score: Two  Imaging: DG Ankle Complete Right  Result Date: 03/20/2021 CLINICAL DATA:  Questionable sepsis EXAM: RIGHT ANKLE - COMPLETE 3+ VIEW COMPARISON:  None. FINDINGS: No fracture or dislocation of the right ankle. Mild ankle mortise arthrosis with a chronic, posttraumatic ossicle of the medial  malleolar tip. Vascular calcinosis. IMPRESSION: No fracture or dislocation of the right ankle. Mild ankle mortise arthrosis with a chronic, posttraumatic ossicle of the medial malleolar tip. Electronically Signed   By: Eddie Candle M.D.   On: 03/20/2021 14:20   CT HEAD WO CONTRAST (5MM)  Result Date: 03/20/2021 CLINICAL DATA:  Neurological deficit EXAM: CT HEAD WITHOUT CONTRAST TECHNIQUE: Contiguous axial images were obtained from the base of the skull through the vertex without intravenous contrast. COMPARISON:  CT head dated February 19, 2021 FINDINGS: Brain: No evidence of acute infarction, hemorrhage, hydrocephalus, extra-axial collection or mass lesion/mass effect. Chronic white matter ischemic change. Old left basal ganglia lacunar infarcts. Vascular: No hyperdense vessel or unexpected calcification. Skull: Normal. Negative for fracture or focal lesion. Sinuses/Orbits: Fluid fluid level seen in the left sphenoid sinuses. Other: None. IMPRESSION: No acute intracranial abnormality. Fluid fluid level seen in the left sphenoid sinuses, findings can be seen in setting of sinusitis. Electronically Signed   By: Yetta Glassman MD   On: 03/20/2021 15:22   MR ANGIO HEAD WO CONTRAST  Result Date: 03/22/2021 CLINICAL DATA:  Stroke follow-up EXAM: MRA HEAD WITHOUT CONTRAST TECHNIQUE: Angiographic images of the Circle of Willis were acquired using MRA technique without intravenous contrast. COMPARISON:  No pertinent prior exam. FINDINGS: POSTERIOR CIRCULATION: --Vertebral arteries: Both are diminutive. There is loss of flow related enhancement of both vertebral arteries proximal to the vertebrobasilar confluence. --Inferior cerebellar arteries: Normal. --Basilar artery: Diminutive with diffuse irregularity. --Superior cerebellar arteries: Normal on the left. Multifocal stenosis of the right. --Posterior cerebral arteries: Both have fetal type origins from the posterior communicating arteries. Otherwise normal. ANTERIOR  CIRCULATION: --Intracranial internal carotid arteries: The left ICA is occluded. Right ICA is unremarkable. --Anterior cerebral arteries (ACA): Normal. --Middle cerebral arteries (MCA): Normal. ANATOMIC VARIANTS: Fetal origins of both PCAs IMPRESSION: 1. 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 2. 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 3. Diminutive and irregular basilar artery in the context of bilateral fetal type origins of the posterior cerebral arteries. Electronically Signed   By: Ulyses Jarred M.D.   On: 03/22/2021 03:36   MR BRAIN WO  CONTRAST  Result Date: 03/20/2021 CLINICAL DATA:  Transient ischemic attack. History of multiple myeloma. EXAM: MRI HEAD WITHOUT CONTRAST TECHNIQUE: Multiplanar, multiecho pulse sequences of the brain and surrounding structures were obtained without intravenous contrast. COMPARISON:  01/10/2020 FINDINGS: Brain: Multifocal acute ischemia in a watershed distribution in the left hemisphere. No contralateral acute ischemia. No acute hemorrhage. No chronic microhemorrhage. 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 Mild cytotoxic edema associated with the acute insults. The midline structures are normal. Vascular: Major flow voids are preserved. Skull and upper cervical spine: Normal calvarium and skull base. Visualized upper cervical spine and soft tissues are normal. Sinuses/Orbits:No paranasal sinus fluid levels or advanced mucosal thickening. No mastoid or middle ear effusion. Normal orbits. IMPRESSION: 1. Multifocal acute ischemia in a watershed distribution in the left hemisphere. No hemorrhage or mass effect. 2. 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 Electronically Signed   By: Ulyses Jarred M.D.   On: 03/20/2021 23:26   DG Chest Port 1 View  Result Date: 03/20/2021 CLINICAL DATA:  Possible sepsis. EXAM: PORTABLE CHEST 1 VIEW COMPARISON:   Single-view of the chest 02/19/2021. FINDINGS: Right IJ approach Port-A-Cath is unchanged. Lungs clear. Heart size is normal. Aortic atherosclerosis. No pneumothorax or pleural fluid. No acute bony abnormality. Right fifth rib fracture again seen. IMPRESSION: No acute disease. Aortic Atherosclerosis (ICD10-I70.0). Electronically Signed   By: Inge Rise M.D.   On: 03/20/2021 14:19   ECHOCARDIOGRAM COMPLETE BUBBLE STUDY  Result Date: 03/21/2021    ECHOCARDIOGRAM REPORT   Patient Name:   Scott Mcdowell Date of Exam: 03/21/2021 Medical Rec #:  [id number]                   Height:       75.0 in Accession #:    [id number]                  Weight:       167.3 lb Date of Birth:  1941/03/12                   BSA:          2.034 m Patient Age:    60 years                    BP:           82/68 mmHg Patient Gender: M                           HR:           95 bpm. Exam Location:  Inpatient Procedure: 2D Echo, Cardiac Doppler and Color Doppler Indications:    Stroke  History:        Patient has prior history of Echocardiogram examinations, most                 recent 01/15/2019. COPD; Signs/Symptoms:Altered Mental Status.  Sonographer:    Merrie Roof RDCS Referring Phys: [id number] ERIC CHEN  Sonographer Comments: Suboptimal apical window. Restricted mobility. No peripheral IV so no contrast and no bubble was performed at this time IMPRESSIONS  1. Left ventricular ejection fraction, by estimation, is 45 to 50%. The left ventricle has mildly decreased function. The left ventricle demonstrates regional wall motion abnormalities (see scoring diagram/findings for description). There is mild left ventricular hypertrophy. Left ventricular diastolic parameters are indeterminate.  2. Right ventricular systolic function  is normal. The right ventricular size is normal.  3. Left atrial size was mildly dilated.  4. The mitral valve is normal in structure. Moderate mitral valve regurgitation. No evidence of mitral stenosis.  5.  Tricuspid valve regurgitation is moderate.  6. The aortic valve is tricuspid. There is moderate calcification of the aortic valve. There is moderate thickening of the aortic valve. Aortic valve regurgitation is not visualized. Mild to moderate aortic valve sclerosis/calcification is present, without any evidence of aortic stenosis.  7. The inferior vena cava is normal in size with greater than 50% respiratory variability, suggesting right atrial pressure of 3 mmHg. Conclusion(s)/Recommendation(s): No intracardiac source of embolism detected on this transthoracic study. A transesophageal echocardiogram is recommended to exclude cardiac source of embolism if clinically indicated. FINDINGS  Left Ventricle: Left ventricular ejection fraction, by estimation, is 45 to 50%. The left ventricle has mildly decreased function. The left ventricle demonstrates regional wall motion abnormalities. The left ventricular internal cavity size was normal in size. There is mild left ventricular hypertrophy. Left ventricular diastolic parameters are indeterminate.  LV Wall Scoring: The apical anterior segment and apex are akinetic. The mid anterolateral segment and mid anterior segment are hypokinetic. Right Ventricle: The right ventricular size is normal. No increase in right ventricular wall thickness. Right ventricular systolic function is normal. Left Atrium: Left atrial size was mildly dilated. Right Atrium: Right atrial size was normal in size. Pericardium: There is no evidence of pericardial effusion. Mitral Valve: The mitral valve is normal in structure. Moderate mitral valve regurgitation. No evidence of mitral valve stenosis. Tricuspid Valve: The tricuspid valve is normal in structure. Tricuspid valve regurgitation is moderate . No evidence of tricuspid stenosis. Aortic Valve: The aortic valve is tricuspid. There is moderate calcification of the aortic valve. There is moderate thickening of the aortic valve. Aortic valve  regurgitation is not visualized. Mild to moderate aortic valve sclerosis/calcification is present, without any evidence of aortic stenosis. Pulmonic Valve: The pulmonic valve was normal in structure. Pulmonic valve regurgitation is not visualized. No evidence of pulmonic stenosis. Aorta: The aortic root is normal in size and structure. Venous: The inferior vena cava is normal in size with greater than 50% respiratory variability, suggesting right atrial pressure of 3 mmHg. IAS/Shunts: No atrial level shunt detected by color flow Doppler.  LEFT VENTRICLE PLAX 2D LVIDd:         4.80 cm LVIDs:         3.60 cm LV PW:         1.20 cm LV IVS:        1.00 cm LVOT diam:     2.00 cm LV SV:         43 LV SV Index:   21 LVOT Area:     3.14 cm  RIGHT VENTRICLE RV Basal diam:  3.40 cm LEFT ATRIUM             Index       RIGHT ATRIUM           Index LA diam:        5.00 cm 2.46 cm/m  RA Area:     17.10 cm LA Vol (A2C):   72.5 ml 35.64 ml/m RA Volume:   42.40 ml  20.84 ml/m LA Vol (A4C):   82.5 ml 40.56 ml/m LA Biplane Vol: 84.2 ml 41.39 ml/m  AORTIC VALVE LVOT Vmax:   82.80 cm/s LVOT Vmean:  54.500 cm/s LVOT VTI:    0.137 m  AORTA Ao Root diam: 3.30 cm MITRAL VALVE MV Area (PHT): 7.02 cm    SHUNTS MV Decel Time: 108 msec    Systemic VTI:  0.14 m MV E velocity: 81.80 cm/s  Systemic Diam: 2.00 cm MV A velocity: 70.70 cm/s MV E/A ratio:  1.16 Candee Furbish MD Electronically signed by Candee Furbish MD Signature Date/Time: 03/21/2021/6:35:14 PM    Final    VAS US CAROTID  Result Date: 03/21/2021 Carotid Arterial Duplex Study Patient Name:  CHISTIAN KASLER Hundertmark  Date of Exam:   03/21/2021 Medical Rec #: [id number]                    Accession #:    [id number] Date of Birth: August 20, 1940                    Patient Gender: M Patient Age:   80Y Exam Location:  [facility] Procedure:      VAS US CAROTID Referring Phys: [id number] [place] --------------------------------------------------------------------------------   Indications:       Speech disturbance. Risk Factors:      Past history of smoking, prior CVA. 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 Comparison Study:  No previous exams Performing Technologist: Jody Hill RVT, RDMS  Examination Guidelines: A complete evaluation includes B-mode imaging, spectral Doppler, color Doppler, and power Doppler as needed of all accessible portions of each vessel. Bilateral testing is considered an integral part of a complete examination. Limited examinations for reoccurring indications may be performed as noted.  Right Carotid Findings: +----------+-------+--------+--------+-----------------------+-----------------+           PSV    EDV cm/sStenosisPlaque Description     Comments                    cm/s                                                            +----------+-------+--------+--------+-----------------------+-----------------+ CCA Prox  57     18              heterogenous           intimal                                                                   thickening        +----------+-------+--------+--------+-----------------------+-----------------+ CCA Distal44     12              heterogenous and       intimal                                            calcific  thickening        +----------+-------+--------+--------+-----------------------+-----------------+ ICA Prox  85     29      1-39%   calcific and irregular                   +----------+-------+--------+--------+-----------------------+-----------------+ ICA Distal56     19                                                       +----------+-------+--------+--------+-----------------------+-----------------+ ECA       56     0               irregular and calcific                   +----------+-------+--------+--------+-----------------------+-----------------+  +----------+--------+-------+----------------+-------------------+           PSV cm/sEDV cmsDescribe        Arm Pressure (mmHG) +----------+--------+-------+----------------+-------------------+ Subclavian30             Multiphasic, WNL                    +----------+--------+-------+----------------+-------------------+ +---------+--------+--+--------+-+---------+ VertebralPSV cm/s21EDV cm/s8Antegrade +---------+--------+--+--------+-+---------+  Left Carotid Findings: +----------+--------+--------+--------+---------------------+------------------+           PSV cm/sEDV cm/sStenosisPlaque Description   Comments           +----------+--------+--------+--------+---------------------+------------------+ CCA Prox  30      0               calcific             intimal thickening +----------+--------+--------+--------+---------------------+------------------+ CCA Distal27      0               calcific and         intimal thickening                                   heterogenous                            +----------+--------+--------+--------+---------------------+------------------+ ICA Prox  269     120     80-99%  diffuse, calcific and                                                     heterogenous                            +----------+--------+--------+--------+---------------------+------------------+ ICA Mid   41      21                                                      +----------+--------+--------+--------+---------------------+------------------+ ICA Distal35      17                                                      +----------+--------+--------+--------+---------------------+------------------+  ECA       95      0               calcific                                +----------+--------+--------+--------+---------------------+------------------+ +----------+--------+--------+------------------+-------------------+            PSV cm/sEDV cm/sDescribe          Arm Pressure (mmHG) +----------+--------+--------+------------------+-------------------+ Subclavian100             Abnormal (LUE AVF)                    +----------+--------+--------+------------------+-------------------+ +---------+--------+--+--------+-+--------------------------------------------+ VertebralPSV cm/s23EDV cm/s0High resistant and with loss of diastolic                                flow                                         +---------+--------+--+--------+-+--------------------------------------------+   Summary: Right Carotid: Velocities in the right ICA are consistent with a 1-39% stenosis.                Non-hemodynamically significant plaque <50% noted in the CCA. The                ECA appears <50% stenosed. Left Carotid: Velocities in the left ICA are consistent with a 80-99% stenosis.               Non-hemodynamically significant plaque <50% noted in the CCA. The               ECA appears <50% stenosed. Vertebrals:  Right vertebral artery demonstrates antegrade flow. Left vertebral              artery demonstrates high resistant flow with loss of diastolic              flow. Subclavians: Normal flow hemodynamics were seen in the right subclavian artery.              Abnormal flow seen in left subclavian artery (LUE AVF). *See table(s) above for measurements and observations.  Vascular consult recommended. Electronically signed by Harold Barban MD on 03/21/2021 at 11:54:15 AM.    Final     Labs:  CBC: Recent Labs    03/20/21 1238 03/20/21 1311 03/21/21 0800 03/22/21 0020 03/23/21 0155  WBC 8.1  --  4.6 4.0 4.4  HGB 5.7* 5.4* 7.8* 7.7* 8.0*  HCT 19.0* 16.0* 24.1* 23.7* 24.4*  PLT 299  --  186 152 151    COAGS: Recent Labs    03/20/21 1238  INR 1.9*  APTT 30    BMP: Recent Labs    03/20/21 1238 03/20/21 1311 03/21/21 0800 03/22/21 0020 03/23/21 0155  NA 133* 131* 134* 136 137  K 4.6 4.4 3.6 3.6  2.8*  CL 94* 95* 95* 100 105  CO2 22  --  '27 23 23  ' GLUCOSE 162* 161* 95 79 106*  BUN 54* 54* 58* 51* 41*  CALCIUM 8.9  --  8.2* 7.8* 7.5*  CREATININE 2.51* 2.50* 2.30* 1.98* 1.94*  GFRNONAA 25*  --  28* 33* 34*    LIVER FUNCTION TESTS: Recent Labs    06/21/20 1133 09/27/20  0000 12/26/20 0000 03/20/21 1238 03/21/21 0800  BILITOT 0.8  --   --  1.0 1.5*  AST 33 23 19 36 17  ALT 51* 34 30 39 29  ALKPHOS 57 68 95 58 46  PROT 6.7  --   --  5.7* 4.8*  ALBUMIN 3.7 3.9 3.3* 2.2* 1.9*    TUMOR MARKERS: No results for input(s): AFPTM, CEA, CA199, CHROMGRNA in the last 8760 hours.  Assessment and Plan: 80 y.o. 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   Patient presents to the ED on 03/20/2021 with multiple complaints, blood cultures were drawn due to open wound on right ankle.  Blood culture finalized today and grew Staph aureus.  ID was consulted, recommended removal of the Port-A-Cath for line holiday.  IR was requested for Port-A-Cath removal. Case was reviewed and approved by Dr. Serafina Royals.  The procedure is tentatively scheduled for tomorrow.  Made n.p.o. at midnight.  VSS CBC stable Hgb 8.0 PLT 151 Has not been on Eliquis during this admission since 03/20/2021  Patient is oriented to self only, not able to make his own medical decision. The procedure was explained to the patient and his daughter at the bedside, daughter asked to contact patient's spouse for consent.  Patient's spouse Mrs. Harold Moncus was contacted via telephone. Spouse states that the Port-A-Cath has not been used for chemotherapy for a while as patient now takes pills for his cancer treatment.  Risks and benefits of image guided port-a-catheter removal was discussed with the patient's spouse including, but not limited  to bleeding, infection, and damage to blood vessel.  All of the spouse's questions were answered, patient is agreeable to proceed. Consent signed and in IR binder.    Thank you for this interesting consult.  I greatly enjoyed meeting Mercer Peifer [facility] and look forward to participating in their care.  A copy of this report was sent to the requesting provider on this date.  Electronically Signed: Tera Mater, PA-C 03/23/2021, 3:33 PM   I spent a total of 20 Minutes   in face to face in clinical consultation, greater than 50% of which was counseling/coordinating care for Port-A-Cath removal.

## 2021-03-23 NOTE — Progress Notes (Addendum)
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  He has been hospitalized at [facility] multiple times in the last few months.  His right ankle pressure wound has been getting progressively worse with some drainage. In the ED patient was noted to be hypotensive with blood pressures in the 70-80s, hemoglobin was 5.4, creatinine was 2.5.  CT head noted old left basal ganglia infarcts.  Wife and daughter deny any blood in stools or black stools recently. 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 -Transfused 2 units of PRBC, anemia panel suggestive of chronic disease, no iron deficiency -Not stable for endoscopic evaluation at this time in the setting of acute CVA, hypotension etc. -Hemoglobin is stable now, no evidence of active bleeding,  -Change PPI to p.o., CBC in a.m.  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 -Suspect hypotension is multifactorial, likely dehydration, blood loss and infection contributing, blood pressure more stable now, continue IV fluids we will decrease rate, continue midodrine and IV antibiotics -Eliquis on hold in the setting of severe  anemia -Appreciate neurology input, recommended palliative care, no value in additional work-up -PT OT, SLP evaluations completed, home therapy recommended  Hypotension -See discussion above, likely multifactorial, has bacteremia, severe anemia likely contributing, antibiotics and fluids as above -Blood pressure stable now  MSSA bacteremia Right ankle wound, right ankle osteomyelitis, pressure wound-poa -Continue IV Ancef, Flagyl -Repeat blood cultures negative X1 day -Follow-up TTE -Appreciate infectious disease input, recommending line holiday and port removal if aggressive work-up desired -Discussed with daughter and patient's spouse, spouse would like treatment for bacteremia, agreeable to port removal, will consult IR -Continue wound care -Palliative/conservative management most appropriate in this patient  CKD 4 -Baseline creatinine around 1.5, worse in the setting of severe anemia, hypotension etc. -Now stable, IV fluids discontinued  Dysphagia -SLP evaluation ongoing, diet resumed  Multiple myeloma -On Revlimid at this time, hold this  Ethics -This is a chronically ill elderly male who is bedbound, right hemiplegia, pressure wounds with significant functional decline over the last 6 months to a year, multiple myeloma, stage IV kidney disease, now with acute CVA, bacteremia, severe anemia. -Discussed overall poor prognosis with patient's daughter, recommended DNR and symptom/comfort focused care, daughter would like to discuss this with her family, palliative medicine team consulted for goals of care   DVT prophylaxis: SCDs Code Status: Full code Family Communication: No family at bedside, left voicemail for daughter [name] today Disposition Plan:  Status is: Inpatient  Remains inpatient appropriate because:Inpatient level of care appropriate due to severity of illness  Dispo: The patient is from: Home              Anticipated d/c is to: SNF  Patient  currently is not medically stable to d/c.   Difficult to place patient No   Consultants:  Neurology Infectious disease  Procedures:   Antimicrobials:    Subjective: -Denies any complaints, vital signs more stable, tolerating diet  Objective: Vitals:   03/23/21 0029 03/23/21 0511 03/23/21 0745 03/23/21 1200  BP: (!) 110/48 (!) 122/53 (!) 120/56 125/63  Pulse: 65 75    Resp: '18 20 19 18  ' Temp: 98.2 F (36.8 C) 98 F (36.7 C) 97.6 F (36.4 C) 97.7 F (36.5 C)  TempSrc: Axillary Axillary Axillary Axillary  SpO2: 95% 98%    Weight:        Intake/Output Summary (Last 24 hours) at 03/23/2021 1414 Last data filed at 03/22/2021 2030 Gross per 24 hour  Intake 1289.9 ml  Output 400 ml  Net 889.9 ml   Filed Weights   03/20/21 1500  Weight: 75.9 kg    Examination:  General exam: Chronically ill elderly male sitting up in bed, awake alert, dysarthric speech, no distress HEENT: Port-A-Cath noted CVS: S1-S2, regular rate rhythm Lungs: Decreased breath sounds to bases Abdomen: Soft, nontender, bowel sounds present Extremities:  Right upper and lower extremity appears contracted, right ankle wound with dressing  Neuro : Dysarthric, chronic right-sided deficits follows simple commands Psychiatry: Poor insight and judgment  Data Reviewed:   CBC: Recent Labs  Lab 03/20/21 1238 03/20/21 1311 03/21/21 0800 03/22/21 0020 03/23/21 0155  WBC 8.1  --  4.6 4.0 4.4  NEUTROABS 5.8  --  3.6  --   --   HGB 5.7* 5.4* 7.8* 7.7* 8.0*  HCT 19.0* 16.0* 24.1* 23.7* 24.4*  MCV 101.1*  --  92.7 92.6 91.0  PLT 299  --  186 152 144   Basic Metabolic Panel: Recent Labs  Lab 03/20/21 1238 03/20/21 1311 03/20/21 1603 03/21/21 0800 03/22/21 0020 03/23/21 0155  NA 133* 131*  --  134* 136 137  K 4.6 4.4  --  3.6 3.6 2.8*  CL 94* 95*  --  95* 100 105  CO2 22  --   --  '27 23 23  ' GLUCOSE 162* 161*  --  95 79 106*  BUN 54* 54*  --  58* 51* 41*  CREATININE 2.51* 2.50*  --  2.30* 1.98*  1.94*  CALCIUM 8.9  --   --  8.2* 7.8* 7.5*  MG  --   --  2.6*  --   --   --   PHOS  --   --  4.6  --   --   --    GFR: Estimated Creatinine Clearance: 31.5 mL/min (A) (by C-G formula based on SCr of 1.94 mg/dL (H)). Liver Function Tests: Recent Labs  Lab 03/20/21 1238 03/21/21 0800  AST 36 17  ALT 39 29  ALKPHOS 58 46  BILITOT 1.0 1.5*  PROT 5.7* 4.8*  ALBUMIN 2.2* 1.9*   Recent Labs  Lab 03/20/21 1603  LIPASE 23   No results for input(s): AMMONIA in the last 168 hours. Coagulation Profile: Recent Labs  Lab 03/20/21 1238  INR 1.9*   Cardiac Enzymes: No results for input(s): CKTOTAL, CKMB, CKMBINDEX, TROPONINI in the last 168 hours. BNP (last 3 results) No results for input(s): PROBNP in the last 8760 hours. HbA1C: Recent Labs    03/21/21 0800  HGBA1C 5.3   CBG: No results for input(s): GLUCAP in the last 168 hours. Lipid Profile: Recent Labs    03/22/21 0020  CHOL 80  HDL 20*  LDLCALC 41  TRIG 93  CHOLHDL 4.0   Thyroid Function Tests: No results for input(s): TSH, T4TOTAL, FREET4, T3FREE, THYROIDAB in the last 72 hours. Anemia Panel: Recent Labs    03/21/21 0800  VITAMINB12 188  FOLATE 15.5  FERRITIN 3,215*  TIBC 146*  IRON 70  RETICCTPCT 2.1   Urine analysis:    Component Value Date/Time   COLORURINE YELLOW 03/20/2021 1840   APPEARANCEUR HAZY (A) 03/20/2021 1840   LABSPEC 1.013 03/20/2021 1840   PHURINE 5.0 03/20/2021 1840   GLUCOSEU NEGATIVE 03/20/2021 1840   HGBUR NEGATIVE 03/20/2021 1840   BILIRUBINUR NEGATIVE 03/20/2021 1840   KETONESUR NEGATIVE 03/20/2021 1840   PROTEINUR NEGATIVE 03/20/2021 1840   NITRITE NEGATIVE 03/20/2021 1840   LEUKOCYTESUR NEGATIVE 03/20/2021 1840   Sepsis Labs: '@LABRCNTIP' (procalcitonin:4,lacticidven:4)  ) Recent Results (from the past 240 hour(s))  Resp Panel by RT-PCR (Flu A&B, Covid) Nasopharyngeal Swab     Status: None   Collection Time: 03/20/21 12:27 PM   Specimen: Nasopharyngeal Swab;  Nasopharyngeal(NP) swabs in vial transport medium  Result Value Ref Range Status   SARS Coronavirus 2 by RT PCR NEGATIVE NEGATIVE Final    Comment: (NOTE) SARS-CoV-2 target nucleic acids are NOT DETECTED.  The SARS-CoV-2 RNA is generally detectable in upper respiratory specimens during the acute phase of infection. The lowest concentration of SARS-CoV-2 viral copies this assay can detect is 138 copies/mL. A negative result does not preclude SARS-Cov-2 infection and should not be used as the sole basis for treatment or other patient management decisions. A negative result may occur with  improper specimen collection/handling, submission of specimen other than nasopharyngeal swab, presence of viral mutation(s) within the areas targeted by this assay, and inadequate number of viral copies(<138 copies/mL). A negative result must be combined with clinical observations, patient history, and epidemiological information. The expected result is Negative.  Fact Sheet for Patients:  EntrepreneurPulse.com.au  Fact Sheet for Healthcare Providers:  IncredibleEmployment.be  This test is no t yet approved or cleared by the [place] FDA and  has been authorized for detection and/or diagnosis of SARS-CoV-2 by FDA under an Emergency Use Authorization (EUA). This EUA will remain  in effect (meaning this test can be used) for the duration of the COVID-19 declaration under Section 564(b)(1) of the Act, 21 U.S.C.section 360bbb-3(b)(1), unless the authorization is terminated  or revoked sooner.       Influenza A by PCR NEGATIVE NEGATIVE Final   Influenza B by PCR NEGATIVE NEGATIVE Final    Comment: (NOTE) The Xpert Xpress SARS-CoV-2/FLU/RSV plus assay is intended as an aid in the diagnosis of influenza from Nasopharyngeal swab specimens and should not be used as a sole basis for treatment. Nasal washings and aspirates are unacceptable for Xpert Xpress  SARS-CoV-2/FLU/RSV testing.  Fact Sheet for Patients: EntrepreneurPulse.com.au  Fact Sheet for Healthcare Providers: IncredibleEmployment.be  This test is not yet approved or cleared by the [place] FDA and has been authorized for detection and/or diagnosis of SARS-CoV-2 by FDA under an Emergency Use Authorization (EUA). This EUA will remain in effect (meaning this test can be used) for the duration of the COVID-19 declaration under Section 564(b)(1) of the Act, 21 U.S.C. section 360bbb-3(b)(1), unless the authorization is terminated or revoked.  Performed at [facility], [place] [address], [place], [place] [address]   Blood Culture (routine x 2)     Status: Abnormal   Collection Time: 03/20/21 12:56 PM   Specimen: BLOOD RIGHT FOREARM  Result Value Ref  Range Status   Specimen Description BLOOD RIGHT FOREARM  Final   Special Requests   Final    BOTTLES DRAWN AEROBIC AND ANAEROBIC Blood Culture results may not be optimal due to an inadequate volume of blood received in culture bottles   Culture  Setup Time   Final    GRAM POSITIVE COCCI IN CLUSTERS IN BOTH AEROBIC AND ANAEROBIC BOTTLES Organism ID to follow CRITICAL RESULT CALLED TO, READ BACK BY AND VERIFIED WITH: PHARMD ALEX LAWLESS 03/21/21'@1143'  BY TW Performed at [facility], [place] [address], [place], [place] [address]    Culture STAPHYLOCOCCUS AUREUS (A)  Final   Report Status 03/23/2021 FINAL  Final   Organism ID, Bacteria STAPHYLOCOCCUS AUREUS  Final      Susceptibility   Staphylococcus aureus - MIC*    CIPROFLOXACIN <=0.5 SENSITIVE Sensitive     ERYTHROMYCIN >=8 RESISTANT Resistant     GENTAMICIN <=0.5 SENSITIVE Sensitive     OXACILLIN <=0.25 SENSITIVE Sensitive     TETRACYCLINE >=16 RESISTANT Resistant     VANCOMYCIN <=0.5 SENSITIVE Sensitive     TRIMETH/SULFA <=10 SENSITIVE Sensitive     CLINDAMYCIN >=8 RESISTANT Resistant     RIFAMPIN <=0.5 SENSITIVE Sensitive      Inducible Clindamycin NEGATIVE Sensitive     * STAPHYLOCOCCUS AUREUS  Blood Culture ID Panel (Reflexed)     Status: Abnormal   Collection Time: 03/20/21 12:56 PM  Result Value Ref Range Status   Enterococcus faecalis NOT DETECTED NOT DETECTED Final   Enterococcus Faecium NOT DETECTED NOT DETECTED Final   Listeria monocytogenes NOT DETECTED NOT DETECTED Final   Staphylococcus species DETECTED (A) NOT DETECTED Final    Comment: CRITICAL RESULT CALLED TO, READ BACK BY AND VERIFIED WITH: PHARMD ALEX LAWLESS 03/21/21'@1143'  BY TW    Staphylococcus aureus (BCID) DETECTED (A) NOT DETECTED Final    Comment: CRITICAL RESULT CALLED TO, READ BACK BY AND VERIFIED WITH: PHARMD ALEX LAWLESS 03/21/21'@11423'  BY TW    Staphylococcus epidermidis NOT DETECTED NOT DETECTED Final   Staphylococcus lugdunensis NOT DETECTED NOT DETECTED Final   Streptococcus species NOT DETECTED NOT DETECTED Final   Streptococcus agalactiae NOT DETECTED NOT DETECTED Final   Streptococcus pneumoniae NOT DETECTED NOT DETECTED Final   Streptococcus pyogenes NOT DETECTED NOT DETECTED Final   A.calcoaceticus-baumannii NOT DETECTED NOT DETECTED Final   Bacteroides fragilis NOT DETECTED NOT DETECTED Final   Enterobacterales NOT DETECTED NOT DETECTED Final   Enterobacter cloacae complex NOT DETECTED NOT DETECTED Final   Escherichia coli NOT DETECTED NOT DETECTED Final   Klebsiella aerogenes NOT DETECTED NOT DETECTED Final   Klebsiella oxytoca NOT DETECTED NOT DETECTED Final   Klebsiella pneumoniae NOT DETECTED NOT DETECTED Final   Proteus species NOT DETECTED NOT DETECTED Final   Salmonella species NOT DETECTED NOT DETECTED Final   Serratia marcescens NOT DETECTED NOT DETECTED Final   Haemophilus influenzae NOT DETECTED NOT DETECTED Final   Neisseria meningitidis NOT DETECTED NOT DETECTED Final   Pseudomonas aeruginosa NOT DETECTED NOT DETECTED Final   Stenotrophomonas maltophilia NOT DETECTED NOT DETECTED Final   Candida albicans  NOT DETECTED NOT DETECTED Final   Candida auris NOT DETECTED NOT DETECTED Final   Candida glabrata NOT DETECTED NOT DETECTED Final   Candida krusei NOT DETECTED NOT DETECTED Final   Candida parapsilosis NOT DETECTED NOT DETECTED Final   Candida tropicalis NOT DETECTED NOT DETECTED Final   Cryptococcus neoformans/gattii NOT DETECTED NOT DETECTED Final   Meth resistant mecA/C and MREJ NOT DETECTED NOT  DETECTED Final    Comment: Performed at [facility], [place] [address], [place], [place] [address]  Culture, blood (routine x 2)     Status: None (Preliminary result)   Collection Time: 03/22/21  8:34 AM   Specimen: BLOOD RIGHT HAND  Result Value Ref Range Status   Specimen Description BLOOD RIGHT HAND  Final   Special Requests   Final    BOTTLES DRAWN AEROBIC AND ANAEROBIC Blood Culture adequate volume   Culture   Final    NO GROWTH < 24 HOURS Performed at [facility], [place] [address], [place], [place] [address]    Report Status PENDING  Incomplete  Culture, blood (routine x 2)     Status: None (Preliminary result)   Collection Time: 03/22/21  8:42 AM   Specimen: BLOOD RIGHT HAND  Result Value Ref Range Status   Specimen Description BLOOD RIGHT HAND  Final   Special Requests   Final    BOTTLES DRAWN AEROBIC AND ANAEROBIC Blood Culture results may not be optimal due to an inadequate volume of blood received in culture bottles   Culture   Final    NO GROWTH < 24 HOURS Performed at [facility], [place] [address], [place], [place] [address]    Report Status PENDING  Incomplete         Radiology Studies: MR ANGIO HEAD WO CONTRAST  Result Date: 03/22/2021 CLINICAL DATA:  Stroke follow-up EXAM: MRA HEAD WITHOUT CONTRAST TECHNIQUE: Angiographic images of the Circle of Willis were acquired using MRA technique without intravenous contrast. COMPARISON:  No pertinent prior exam. FINDINGS: POSTERIOR CIRCULATION: --Vertebral arteries: Both are diminutive. There is loss of flow  related enhancement of both vertebral arteries proximal to the vertebrobasilar confluence. --Inferior cerebellar arteries: Normal. --Basilar artery: Diminutive with diffuse irregularity. --Superior cerebellar arteries: Normal on the left. Multifocal stenosis of the right. --Posterior cerebral arteries: Both have fetal type origins from the posterior communicating arteries. Otherwise normal. ANTERIOR CIRCULATION: --Intracranial internal carotid arteries: The left ICA is occluded. Right ICA is unremarkable. --Anterior cerebral arteries (ACA): Normal. --Middle cerebral arteries (MCA): Normal. ANATOMIC VARIANTS: Fetal origins of both PCAs IMPRESSION: 1. 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 2. 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 3. Diminutive and irregular basilar artery in the context of bilateral fetal type origins of the posterior cerebral arteries. Electronically Signed   By: Ulyses Jarred M.D.   On: 03/22/2021 03:36   ECHOCARDIOGRAM COMPLETE BUBBLE STUDY  Result Date: 03/21/2021    ECHOCARDIOGRAM REPORT   Patient Name:   TOUSSAINT GOLSON Bucknam Date of Exam: 03/21/2021 Medical Rec #:  [id number]                   Height:       75.0 in Accession #:    [id number]                  Weight:       167.3 lb Date of Birth:  1940-10-31                   BSA:          2.034 m Patient Age:    15 years                    BP:           82/68 mmHg Patient Gender:  M                           HR:           95 bpm. Exam Location:  Inpatient Procedure: 2D Echo, Cardiac Doppler and Color Doppler Indications:    Stroke  History:        Patient has prior history of Echocardiogram examinations, most                 recent 01/15/2019. COPD; Signs/Symptoms:Altered Mental Status.  Sonographer:    Merrie Roof RDCS Referring Phys: [id number] ERIC CHEN  Sonographer Comments: Suboptimal apical window. Restricted mobility. No peripheral IV so no contrast  and no bubble was performed at this time IMPRESSIONS  1. Left ventricular ejection fraction, by estimation, is 45 to 50%. The left ventricle has mildly decreased function. The left ventricle demonstrates regional wall motion abnormalities (see scoring diagram/findings for description). There is mild left ventricular hypertrophy. Left ventricular diastolic parameters are indeterminate.  2. Right ventricular systolic function is normal. The right ventricular size is normal.  3. Left atrial size was mildly dilated.  4. The mitral valve is normal in structure. Moderate mitral valve regurgitation. No evidence of mitral stenosis.  5. Tricuspid valve regurgitation is moderate.  6. The aortic valve is tricuspid. There is moderate calcification of the aortic valve. There is moderate thickening of the aortic valve. Aortic valve regurgitation is not visualized. Mild to moderate aortic valve sclerosis/calcification is present, without any evidence of aortic stenosis.  7. The inferior vena cava is normal in size with greater than 50% respiratory variability, suggesting right atrial pressure of 3 mmHg. Conclusion(s)/Recommendation(s): No intracardiac source of embolism detected on this transthoracic study. A transesophageal echocardiogram is recommended to exclude cardiac source of embolism if clinically indicated. FINDINGS  Left Ventricle: Left ventricular ejection fraction, by estimation, is 45 to 50%. The left ventricle has mildly decreased function. The left ventricle demonstrates regional wall motion abnormalities. The left ventricular internal cavity size was normal in size. There is mild left ventricular hypertrophy. Left ventricular diastolic parameters are indeterminate.  LV Wall Scoring: The apical anterior segment and apex are akinetic. The mid anterolateral segment and mid anterior segment are hypokinetic. Right Ventricle: The right ventricular size is normal. No increase in right ventricular wall thickness. Right  ventricular systolic function is normal. Left Atrium: Left atrial size was mildly dilated. Right Atrium: Right atrial size was normal in size. Pericardium: There is no evidence of pericardial effusion. Mitral Valve: The mitral valve is normal in structure. Moderate mitral valve regurgitation. No evidence of mitral valve stenosis. Tricuspid Valve: The tricuspid valve is normal in structure. Tricuspid valve regurgitation is moderate . No evidence of tricuspid stenosis. Aortic Valve: The aortic valve is tricuspid. There is moderate calcification of the aortic valve. There is moderate thickening of the aortic valve. Aortic valve regurgitation is not visualized. Mild to moderate aortic valve sclerosis/calcification is present, without any evidence of aortic stenosis. Pulmonic Valve: The pulmonic valve was normal in structure. Pulmonic valve regurgitation is not visualized. No evidence of pulmonic stenosis. Aorta: The aortic root is normal in size and structure. Venous: The inferior vena cava is normal in size with greater than 50% respiratory variability, suggesting right atrial pressure of 3 mmHg. IAS/Shunts: No atrial level shunt detected by color flow Doppler.  LEFT VENTRICLE PLAX 2D LVIDd:         4.80 cm LVIDs:  3.60 cm LV PW:         1.20 cm LV IVS:        1.00 cm LVOT diam:     2.00 cm LV SV:         43 LV SV Index:   21 LVOT Area:     3.14 cm  RIGHT VENTRICLE RV Basal diam:  3.40 cm LEFT ATRIUM             Index       RIGHT ATRIUM           Index LA diam:        5.00 cm 2.46 cm/m  RA Area:     17.10 cm LA Vol (A2C):   72.5 ml 35.64 ml/m RA Volume:   42.40 ml  20.84 ml/m LA Vol (A4C):   82.5 ml 40.56 ml/m LA Biplane Vol: 84.2 ml 41.39 ml/m  AORTIC VALVE LVOT Vmax:   82.80 cm/s LVOT Vmean:  54.500 cm/s LVOT VTI:    0.137 m  AORTA Ao Root diam: 3.30 cm MITRAL VALVE MV Area (PHT): 7.02 cm    SHUNTS MV Decel Time: 108 msec    Systemic VTI:  0.14 m MV E velocity: 81.80 cm/s  Systemic Diam: 2.00 cm MV A  velocity: 70.70 cm/s MV E/A ratio:  1.16 Candee Furbish MD Electronically signed by Candee Furbish MD Signature Date/Time: 03/21/2021/6:35:14 PM    Final         Scheduled Meds:  Chlorhexidine Gluconate Cloth  6 each Topical Daily   collagenase   Topical Daily   darbepoetin (ARANESP) injection - NON-DIALYSIS  40 mcg Subcutaneous Q Thu-1800   ferrous sulfate  325 mg Oral Q breakfast   midodrine  10 mg Oral BID WC   pantoprazole (PROTONIX) IV  40 mg Intravenous Q24H   thiamine injection  100 mg Intravenous Daily   Or   thiamine  100 mg Oral Daily   vitamin B-12  1,000 mcg Oral Daily   Continuous Infusions:  sodium chloride      ceFAZolin (ANCEF) IV 2 g (03/23/21 0941)   metronidazole 500 mg (03/23/21 1409)     LOS: 3 days    Time spent:  23mn  PDomenic Polite MD [facility]   03/23/2021, 2:14 PM

## 2021-03-23 NOTE — Care Management Important Message (Signed)
Important Message  Patient Details  Name: Scott Mcdowell MRN: [id number] Date of Birth: 27-Nov-1940   Medicare Important Message Given:  Yes     [name] 03/23/2021, 2:55 PM

## 2021-03-23 NOTE — Consult Note (Signed)
Consultation Note Date: 03/23/2021   Patient Name: Scott Mcdowell  DOB: February 11, 1941  MRN: [id number]  Age / Sex: 80 y.o., male  PCP: [name], MD Referring Physician: Domenic Polite, MD  Reason for Consultation: Establishing goals of care  HPI/Patient Profile: 80 y.o. 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 Workup reveals sepsis r/t MSSA bacteremia presumed from R ankle wound, anemia with Hgb 5.7, new watershed infarct, fecal occult positive. Infectious disease recommends removing PAC, 4 weeks of antibiotics, TEE. SLP consulted and he is tolerating dysphagia 3 diet. Palliative medicine consulted for [place] discussion due to multiple life limiting chronic illnesses.  Clinical Assessment and Goals of Care: I have reviewed medical records including EPIC notes, labs and imaging, received report from RN, assessed the patient and then met at the bedside along with patient's daughter and then by phone with patient's spouse to discuss diagnosis prognosis, GOC, EOL wishes, disposition and options.  I introduced Palliative Medicine as specialized medical care for people living with serious illness. It focuses on providing relief from the symptoms and stress of a serious illness. The goal is to improve quality of life for both the patient and the family.  Patient was awake and alert during my discussion at bedside. His daughter and spouse feel that he is aware of his surroundings and situation. On my exam he nods yes to all questions- even when yes is not the correct answer. [name] states that she feels this waxes and wanes- that earlier when she was  feeding him lunch she would ask if he wants more and he nodded yes- and he was able to communicate to her "no, stop" when he did not want more food.   [name] and [name] expressed their belief that patient was able to make his own decisions. I discussed with them that on my eval at that time- patient is either not able to understand- or more likely not able to express- his answers. Whatever the reason is- this means that [name] is the International aid/development worker for [employer].   We discussed a brief life review of the patient and then focused on their current illness. The natural disease trajectory and expectations at EOL were discussed.  Prior to admission patient was bedbound at baseline. He was conversant at times, but also had episodes of aphasia. [name] tells me that he was content with this quality of life. He had expressed to her that his goal is to live until he is 54 no matter his condition. [name] states that she would not keep him around "just to keep him around" if she felt he was suffering- but he has been content to be bed bound and "catered to". There are multiple family members who assist in his care. [name] would never want for  him to go to a SNF. He would want to remain home until his EOL.   I reviewed patient's complicated medical state and the likelihood that he will continue to require rehospitalization even with aggressive measures. We discussed the recommendations of ID for 4 weeks of antibiotics and the difficulties that can occur with prolonged antibiotic treatment as well as the fact that reinfection is likely to occur.   The difference between aggressive medical intervention and comfort care was considered in light of the patient's goals of care.   Advanced directives, concepts specific to code status, artifical feeding and hydration, and rehospitalization were considered and discussed. For now [name] wishes to continue all medical interventions.  [name] was very open to code status discussion.  She shares that it makes sense to her intellectually for patient not to undergo resuscitation- however, emotionally it is a very difficult decision. She wishes to also speak to her family members before making a final decision.   Hospice and Palliative Care services outpatient were explained and offered.  Discussed the importance of continued conversation with family and the medical providers regarding overall plan of care and treatment options, ensuring decisions are within the context of the patient's values and GOCs.     Primary Decision Maker NEXT OF KIN- spouse- [name]    SUMMARY OF RECOMMENDATIONS -Continue full scope care -Daughter is concerned about constipation- Senna was started today- will add glycerin suppository x1 LBM is documented as 8/2 -Schedule Tylenol 658m po BID for possible pain that patient is unable to verbalize -PMT will followup with spouse on Monday for continued code status discussion    Code Status/Advance Care Planning: Full code  Additional Recommendations (Limitations, Scope, Preferences): Full Scope Treatment   Prognosis:   Unable to determine  Discharge Planning: Home with Palliative Services  Primary Diagnoses: Present on Admission:  ARF (acute renal failure) (HCC)  CKD (chronic kidney disease), stage IV (HCC)  [place] not having achieved [place] ([facility]  Anemia in chronic kidney disease  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 The following aspects are pertinent.  Past Medical History:  Diagnosis Date   Goals of care, counseling/discussion 01/29/2019   HTN (hypertension)    Lambda [place] 01/29/2019   [place] [place]    [place] [place]   w right sided weakness   Social History   Socioeconomic History   Marital status: Married    Spouse name:  Not on file   Number of children: Not on file   Years of education: Not on file   Highest education level: Not on file  Occupational History   Not on file  Tobacco Use   Smoking status: Former    Types: Cigarettes   Smokeless tobacco: Never  Substance and Sexual Activity   Alcohol use: Yes    Alcohol/week: 5.0 standard drinks    Types: 5 Cans of beer per week   Drug use: Not on file   Sexual activity: Not on file  Other Topics Concern   Not on file  Social History Narrative   Not on file   Social Determinants of Health   Financial Resource Strain: Not on file  Food Insecurity: Not on file  Transportation Needs: Not on file  Physical Activity: Not on file  Stress: Not on file  Social Connections: Not on file   Scheduled  Meds:  Chlorhexidine Gluconate Cloth  6 each Topical Daily   collagenase   Topical Daily   darbepoetin (ARANESP) injection - NON-DIALYSIS  40 mcg Subcutaneous Q Thu-1800   ferrous sulfate  325 mg Oral Q breakfast   midodrine  5 mg Oral BID WC   pantoprazole (PROTONIX) IV  40 mg Intravenous Q24H   senna-docusate  2 tablet Oral BID   thiamine injection  100 mg Intravenous Daily   Or   thiamine  100 mg Oral Daily   vitamin B-12  1,000 mcg Oral Daily   Continuous Infusions:  sodium chloride      ceFAZolin (ANCEF) IV 2 g (03/23/21 0941)   metronidazole 500 mg (03/23/21 1409)   PRN Meds:.acetaminophen (TYLENOL) oral liquid 160 mg/5 mL, acetaminophen **OR** acetaminophen, ondansetron **OR** ondansetron (ZOFRAN) IV, polyethylene glycol Medications Prior to Admission:  Prior to Admission medications   Medication Sig Start Date End Date Taking? Authorizing Provider  apixaban (ELIQUIS) 5 MG TABS tablet Take 1 tablet (5 mg total) by mouth 2 (two) times daily. Patient taking differently: Take 2.5 mg by mouth 2 (two) times daily. 03/04/19  Yes Angiulli, Lavon Paganini, PA-C  atorvastatin (LIPITOR) 10 MG tablet Take 1 tablet (10 mg total) by mouth daily. 03/04/19  Yes  Angiulli, Lavon Paganini, PA-C  calcitRIOL (ROCALTROL) 0.25 MCG capsule Take 1 capsule by mouth every other day. 01/10/21  Yes [provider]  dexamethasone (DECADRON) 4 MG tablet Take 1 tablet (4 mg total) by mouth daily. Take 5 tablets on the day of chemo 11/20/20  Yes Dayton Scrape A, NP  furosemide (LASIX) 40 MG tablet Take 40 mg by mouth daily.   Yes [provider]  lenalidomide (REVLIMID) 15 MG capsule Take 15 mg by mouth daily. [employer] Auth #      Date Obtained    Yes [provider]  OVER THE COUNTER MEDICATION Take 1 tablet by mouth 2 (two) times daily. Citcrical 1200 mg tab. One in the Am one in the pm.   Yes [provider]  pantoprazole (PROTONIX) 40 MG tablet Take 1 tablet (40 mg total) by mouth 2 (two) times daily. 03/04/19  Yes Angiulli, Lavon Paganini, PA-C  traMADol (ULTRAM) 50 MG tablet Take 1 tablet by mouth every 12 (twelve) hours as needed for pain.   Yes [provider]  Darbepoetin Alfa (ARANESP) 200 MCG/0.4ML SOSY injection Inject 0.4 mLs (200 mcg total) into the vein every Tuesday with hemodialysis. Patient not taking: No sig reported 02/16/19   Mariel Aloe, MD  midodrine (PROAMATINE) 10 MG tablet Take 1 tablet (10 mg total) by mouth 3 (three) times daily with meals. Patient not taking: No sig reported 03/04/19   Angiulli, Lavon Paganini, PA-C  multivitamin (RENA-VIT) TABS tablet Take 1 tablet by mouth at bedtime. Patient not taking: No sig reported 03/04/19   Angiulli, Lavon Paganini, PA-C  polyethylene glycol (MIRALAX / GLYCOLAX) 17 g packet Take 17 g by mouth daily. Patient not taking: No sig reported 02/11/19   Mariel Aloe, MD  senna-docusate (SENOKOT-S) 8.6-50 MG tablet Take 1 tablet by mouth at bedtime. Patient not taking: No sig reported 02/10/19   Mariel Aloe, MD  sevelamer carbonate (RENVELA) 800 MG tablet Take 3 tablets (2,400 mg total) by mouth 3 (three) times daily with meals. Patient not taking: No sig reported 03/04/19   Angiulli,  Lavon Paganini, PA-C   No Known Allergies Review of Systems  Unable to perform ROS: Patient nonverbal  Physical Exam Vitals and nursing note reviewed.    Vital Signs: BP 125/63 (BP Location: Left Leg)   Pulse 75   Temp 97.7 F (36.5 C) (Axillary)   Resp 18   Wt 75.9 kg   SpO2 98%   BMI 20.91 kg/m  Pain Scale: 0-10   Pain Score: 0-No pain   SpO2: SpO2: 98 % O2 Device:SpO2: 98 % O2 Flow Rate: .O2 Flow Rate (L/min): 0 L/min  IO: Intake/output summary:  Intake/Output Summary (Last 24 hours) at 03/23/2021 1534 Last data filed at 03/22/2021 2030 Gross per 24 hour  Intake 405.05 ml  Output 400 ml  Net 5.05 ml    LBM: Last BM Date: 03/21/21 Baseline Weight: Weight: 75.9 kg Most recent weight: Weight: 75.9 kg     Palliative Assessment/Data: PPS: 30%     Thank you for this consult. Palliative medicine will continue to follow and assist as needed.   Time In: 1535 Time Out: 1738 Time Total: 123 minutes Greater than 50%  of this time was spent counseling and coordinating care related to the above assessment and plan.  Signed by: Mariana Kaufman, AGNP-C Palliative Medicine    Please contact Palliative Medicine Team phone at [phone] for questions and concerns.  For individual provider: See Shea Evans

## 2021-03-24 ENCOUNTER — Inpatient Hospital Stay (HOSPITAL_COMMUNITY): Payer: Medicare Other

## 2021-03-24 HISTORY — PX: IR REMOVAL TUN ACCESS W/ PORT W/O FL MOD SED: IMG2290

## 2021-03-24 LAB — BASIC METABOLIC PANEL
Anion gap: 9 (ref 5–15)
BUN: 35 mg/dL — ABNORMAL HIGH (ref 8–23)
CO2: 22 mmol/L (ref 22–32)
Calcium: 7.6 mg/dL — ABNORMAL LOW (ref 8.9–10.3)
Chloride: 106 mmol/L (ref 98–111)
Creatinine, Ser: 1.73 mg/dL — ABNORMAL HIGH (ref 0.61–1.24)
GFR, Estimated: 39 mL/min — ABNORMAL LOW (ref >60)
Glucose, Bld: 87 mg/dL (ref 70–99)
Potassium: 2.8 mmol/L — ABNORMAL LOW (ref 3.5–5.1)
Sodium: 137 mmol/L (ref 135–145)

## 2021-03-24 LAB — CBC
HCT: 26.3 % — ABNORMAL LOW (ref 39.0–52.0)
Hemoglobin: 8.5 g/dL — ABNORMAL LOW (ref 13.0–17.0)
MCH: 29.7 pg (ref 26.0–34.0)
MCHC: 32.3 g/dL (ref 30.0–36.0)
MCV: 92 fL (ref 80.0–100.0)
Platelets: 157 10*3/uL (ref 150–400)
RBC: 2.86 MIL/uL — ABNORMAL LOW (ref 4.22–5.81)
RDW: 18 % — ABNORMAL HIGH (ref 11.5–15.5)
WBC: 3.9 10*3/uL — ABNORMAL LOW (ref 4.0–10.5)
nRBC: 0.8 % — ABNORMAL HIGH (ref 0.0–0.2)

## 2021-03-24 LAB — VITAMIN B1: Vitamin B1 (Thiamine): 88.5 nmol/L (ref 66.5–200.0)

## 2021-03-24 MED ORDER — MIDAZOLAM HCL 2 MG/2ML IJ SOLN
INTRAMUSCULAR | Status: AC | PRN
  Administered 2021-03-24: 0.5 mg via INTRAVENOUS

## 2021-03-24 MED ORDER — POTASSIUM CHLORIDE 20 MEQ PO PACK
40.0000 meq | PACK | Freq: Once | ORAL | Status: AC
  Administered 2021-03-24: 40 meq via ORAL
  Filled 2021-03-24: qty 2

## 2021-03-24 MED ORDER — FENTANYL CITRATE (PF) 100 MCG/2ML IJ SOLN
INTRAMUSCULAR | Status: AC
  Filled 2021-03-24: qty 2

## 2021-03-24 MED ORDER — CEFAZOLIN SODIUM-DEXTROSE 2-4 GM/100ML-% IV SOLN
2.0000 g | Freq: Three times a day (TID) | INTRAVENOUS | Status: DC
  Administered 2021-03-24 – 2021-04-03 (×25): 2 g via INTRAVENOUS
  Filled 2021-03-24 (×25): qty 100

## 2021-03-24 MED ORDER — CHLORHEXIDINE GLUCONATE 4 % EX LIQD
CUTANEOUS | Status: AC
  Filled 2021-03-24: qty 15

## 2021-03-24 MED ORDER — FENTANYL CITRATE (PF) 100 MCG/2ML IJ SOLN
INTRAMUSCULAR | Status: AC | PRN
  Administered 2021-03-24: 25 ug via INTRAVENOUS

## 2021-03-24 MED ORDER — POTASSIUM CHLORIDE 20 MEQ PO PACK
40.0000 meq | PACK | ORAL | Status: AC
Start: 2021-03-24 — End: 2021-03-24
  Administered 2021-03-24 (×2): 40 meq via ORAL
  Filled 2021-03-24: qty 2

## 2021-03-24 MED ORDER — LIDOCAINE-EPINEPHRINE 1 %-1:100000 IJ SOLN
INTRAMUSCULAR | Status: AC
  Filled 2021-03-24: qty 1

## 2021-03-24 MED ORDER — PANTOPRAZOLE SODIUM 40 MG PO TBEC
40.0000 mg | DELAYED_RELEASE_TABLET | Freq: Every day | ORAL | Status: DC
  Administered 2021-03-24 – 2021-04-03 (×11): 40 mg via ORAL
  Filled 2021-03-24 (×10): qty 1

## 2021-03-24 MED ORDER — MIDAZOLAM HCL 2 MG/2ML IJ SOLN
INTRAMUSCULAR | Status: AC
  Filled 2021-03-24: qty 2

## 2021-03-24 NOTE — Plan of Care (Signed)
  Problem: Education: Goal: Knowledge of General Education information will improve Description Including pain rating scale, medication(s)/side effects and non-pharmacologic comfort measures Outcome: Progressing   Problem: Health Behavior/Discharge Planning: Goal: Ability to manage health-related needs will improve Outcome: Progressing   

## 2021-03-24 NOTE — Progress Notes (Signed)
PHARMACY NOTE:  ANTIMICROBIAL RENAL DOSAGE ADJUSTMENT  Current antimicrobial regimen includes a mismatch between antimicrobial dosage and estimated renal function.  As per policy approved by the Pharmacy & Therapeutics and Medical Executive Committees, the antimicrobial dosage will be adjusted accordingly.   Current antimicrobial dosage:  Ancef 2 gram IV every 12 hours  Indication: MSSA bacteremia and R ankle Osteo  Renal Function: Estimated Creatinine Clearance: 35.3 mL/min (A) (by C-G formula based on SCr of 1.73 mg/dL (H)).    Antimicrobial dosage has been changed to:  Ancef 2 grams IV every 8 hours   Thank you for allowing [place] to participate in this patients care. Jens Som, PharmD 03/24/2021 3:26 PM  Please check [email or web address] for unit-specific pharmacy phone numbers.

## 2021-03-24 NOTE — Procedures (Signed)
Interventional Radiology Procedure Note  Procedure: Port removal  Findings: Please refer to procedural dictation for full description. Right IJ port removed intact.  Complications: None immediate  Estimated Blood Loss: < 5 mL  Recommendations: Local wound care. Follow up with IR as needed.   Ruthann Cancer, MD

## 2021-03-24 NOTE — Progress Notes (Signed)
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  He has been hospitalized at [facility] multiple times in the last few months.  His right ankle pressure wound has been getting progressively worse with some drainage. In the ED patient was noted to be hypotensive with blood pressures in the 70-80s, hemoglobin was 5.4, creatinine was 2.5.  CT head noted old left basal ganglia infarcts.  Wife and daughter deny any blood in stools or black stools recently. 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 -Blood Cx w/ MSSA Bacteremia   Assessment & Plan:   MSSA bacteremia Right ankle wound, right ankle osteomyelitis, pressure wound-poa -Continue IV Ancef -Repeat blood cultures are negative X 2 days -Appreciate infectious disease input, recommending line holiday and port removal,  -Discussed with daughter and patient's spouse, spouse would like treatment for bacteremia, agreeable to port removal, IR consulted, to be done today -Continue wound care -Multiple discussions with family suggesting palliative and comfort focused care being most appropriate in this patient  -Palliative team following  Acute on chronic anemia -Suspect this is multifactorial, has underlying multiple myeloma, chronic kidney disease, no history of melena or hematochezia however he was heme positive. -Transfused 2 units of PRBC, anemia panel suggestive of chronic disease, no iron deficiency -He is not a candidate for endoscopic evaluation in the setting with  acute CVA, bacteremia etc. -Hemoglobin is stable now, no evidence of active bleeding,  -Change PPI to p.o., CBC in a.m.  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 -Now stable, IV fluids discontinued  Dysphagia -SLP evaluation ongoing, diet resumed  Multiple myeloma -On Revlimid at this time, hold this  Hypokalemia -Replace, mag level was normal 2 days ago we will recheck tomorrow  Ethics -This is a chronically ill elderly male who is bedbound, right hemiplegia, pressure wounds with significant functional decline over the last 6 months to a year, multiple myeloma, stage IV kidney disease, now with acute CVA, bacteremia, severe anemia, protein calorie malnutrition. -Discussed overall poor prognosis with patient's daughter and spouse, recommended DNR and symptom/comfort focused care -Palliative consult appreciated, family requests full scope of care   DVT prophylaxis: SCDs Code Status: Full code Family Communication: No family at bedside, called and updated daughter [name] and spouse yesterday Disposition Plan:  Status is: Inpatient  Remains inpatient appropriate because:Inpatient level of care appropriate due to severity of illness  Dispo: The patient is from: Home              Anticipated d/c  is to: SNF  Patient currently is not medically stable to d/c.   Difficult to place patient No   Consultants:  Neurology Infectious disease  Procedures:   Antimicrobials:    Subjective: -No events overnight, n.p.o. for port removal  Objective: Vitals:   03/24/21 1040 03/24/21 1045 03/24/21 1102 03/24/21 1107  BP: (!) 76/34 (!) 75/25 (!) 97/56 (!) 102/50  Pulse: 83 69 78 65  Resp: '17 13 13 14  ' Temp:   98.1 F (36.7 C) 98.1 F (36.7 C)  TempSrc:   Oral Oral  SpO2: 100% 100% 97% 97%  Weight:        Intake/Output Summary (Last 24 hours) at 03/24/2021 1216 Last data filed at 03/24/2021 6269 Gross per 24 hour  Intake 305.41 ml  Output 1300 ml  Net -994.59 ml   Filed Weights   03/20/21 1500  Weight: 75.9 kg    Examination:  General exam: Chronically ill elderly male, laying in bed, awake alert, dysarthric speech, no distress HEENT: Port-A-Cath noted CVS: S1-S2, regular rate rhythm Lungs: Decreased breath sounds to bases Abdomen: Soft, nontender, bowel sounds present  Extremities:  Right upper and lower extremity appears contracted, right ankle wound with dressing  Neuro : Dysarthric, chronic right-sided deficits follows simple commands Psychiatry: Poor insight and judgment  Data Reviewed:   CBC: Recent Labs  Lab 03/20/21 1238 03/20/21 1311 03/21/21 0800 03/22/21 0020 03/23/21 0155 03/24/21 0043  WBC 8.1  --  4.6 4.0 4.4 3.9*  NEUTROABS 5.8  --  3.6  --   --   --   HGB 5.7* 5.4* 7.8* 7.7* 8.0* 8.5*  HCT 19.0* 16.0* 24.1* 23.7* 24.4* 26.3*  MCV 101.1*  --  92.7 92.6 91.0 92.0  PLT 299  --  186 152 151 485   Basic Metabolic Panel: Recent Labs  Lab 03/20/21 1238 03/20/21 1311 03/20/21 1603 03/21/21 0800 03/22/21 0020 03/23/21 0155 03/24/21 0043  NA 133* 131*  --  134* 136 137 137  K 4.6 4.4  --  3.6 3.6 2.8* 2.8*  CL 94* 95*  --  95* 100 105 106  CO2 22  --   --  '27 23 23 22  ' GLUCOSE 162* 161*  --  95 79 106* 87  BUN 54* 54*  --  58*  51* 41* 35*  CREATININE 2.51* 2.50*  --  2.30* 1.98* 1.94* 1.73*  CALCIUM 8.9  --   --  8.2* 7.8* 7.5* 7.6*  MG  --   --  2.6*  --   --   --   --   PHOS  --   --  4.6  --   --   --   --    GFR: Estimated Creatinine Clearance: 35.3 mL/min (A) (by C-G formula based on SCr of 1.73 mg/dL (H)). Liver Function Tests: Recent Labs  Lab 03/20/21 1238 03/21/21 0800  AST 36 17  ALT 39 29  ALKPHOS 58 46  BILITOT 1.0 1.5*  PROT 5.7* 4.8*  ALBUMIN 2.2* 1.9*   Recent Labs  Lab 03/20/21 1603  LIPASE 23   No results for input(s): AMMONIA in the last 168 hours. Coagulation Profile: Recent Labs  Lab 03/20/21 1238  INR 1.9*   Cardiac Enzymes: No results for input(s): CKTOTAL, CKMB, CKMBINDEX, TROPONINI in the last 168 hours. BNP (last 3 results) No results for input(s): PROBNP in the last 8760 hours. HbA1C: No results for input(s): HGBA1C in the last 72 hours.  CBG: No results for input(s): GLUCAP in the last  168 hours. Lipid Profile: Recent Labs    03/22/21 0020  CHOL 80  HDL 20*  LDLCALC 41  TRIG 93  CHOLHDL 4.0   Thyroid Function Tests: No results for input(s): TSH, T4TOTAL, FREET4, T3FREE, THYROIDAB in the last 72 hours. Anemia Panel: No results for input(s): VITAMINB12, FOLATE, FERRITIN, TIBC, IRON, RETICCTPCT in the last 72 hours.  Urine analysis:    Component Value Date/Time   COLORURINE YELLOW 03/20/2021 1840   APPEARANCEUR HAZY (A) 03/20/2021 1840   LABSPEC 1.013 03/20/2021 1840   PHURINE 5.0 03/20/2021 1840   GLUCOSEU NEGATIVE 03/20/2021 1840   HGBUR NEGATIVE 03/20/2021 1840   BILIRUBINUR NEGATIVE 03/20/2021 1840   KETONESUR NEGATIVE 03/20/2021 1840   PROTEINUR NEGATIVE 03/20/2021 1840   NITRITE NEGATIVE 03/20/2021 1840   LEUKOCYTESUR NEGATIVE 03/20/2021 1840   Sepsis Labs: '@LABRCNTIP' (procalcitonin:4,lacticidven:4)  ) Recent Results (from the past 240 hour(s))  Resp Panel by RT-PCR (Flu A&B, Covid) Nasopharyngeal Swab     Status: None   Collection  Time: 03/20/21 12:27 PM   Specimen: Nasopharyngeal Swab; Nasopharyngeal(NP) swabs in vial transport medium  Result Value Ref Range Status   SARS Coronavirus 2 by RT PCR NEGATIVE NEGATIVE Final    Comment: (NOTE) SARS-CoV-2 target nucleic acids are NOT DETECTED.  The SARS-CoV-2 RNA is generally detectable in upper respiratory specimens during the acute phase of infection. The lowest concentration of SARS-CoV-2 viral copies this assay can detect is 138 copies/mL. A negative result does not preclude SARS-Cov-2 infection and should not be used as the sole basis for treatment or other patient management decisions. A negative result may occur with  improper specimen collection/handling, submission of specimen other than nasopharyngeal swab, presence of viral mutation(s) within the areas targeted by this assay, and inadequate number of viral copies(<138 copies/mL). A negative result must be combined with clinical observations, patient history, and epidemiological information. The expected result is Negative.  Fact Sheet for Patients:  EntrepreneurPulse.com.au  Fact Sheet for Healthcare Providers:  IncredibleEmployment.be  This test is no t yet approved or cleared by the [place] FDA and  has been authorized for detection and/or diagnosis of SARS-CoV-2 by FDA under an Emergency Use Authorization (EUA). This EUA will remain  in effect (meaning this test can be used) for the duration of the COVID-19 declaration under Section 564(b)(1) of the Act, 21 U.S.C.section 360bbb-3(b)(1), unless the authorization is terminated  or revoked sooner.       Influenza A by PCR NEGATIVE NEGATIVE Final   Influenza B by PCR NEGATIVE NEGATIVE Final    Comment: (NOTE) The Xpert Xpress SARS-CoV-2/FLU/RSV plus assay is intended as an aid in the diagnosis of influenza from Nasopharyngeal swab specimens and should not be used as a sole basis for treatment. Nasal washings  and aspirates are unacceptable for Xpert Xpress SARS-CoV-2/FLU/RSV testing.  Fact Sheet for Patients: EntrepreneurPulse.com.au  Fact Sheet for Healthcare Providers: IncredibleEmployment.be  This test is not yet approved or cleared by the [place] FDA and has been authorized for detection and/or diagnosis of SARS-CoV-2 by FDA under an Emergency Use Authorization (EUA). This EUA will remain in effect (meaning this test can be used) for the duration of the COVID-19 declaration under Section 564(b)(1) of the Act, 21 U.S.C. section 360bbb-3(b)(1), unless the authorization is terminated or revoked.  Performed at [facility], [place] [address] [place], [place] [address]   Blood Culture (routine x 2)     Status: Abnormal   Collection Time: 03/20/21 12:56 PM   Specimen:  BLOOD RIGHT FOREARM  Result Value Ref Range Status   Specimen Description BLOOD RIGHT FOREARM  Final   Special Requests   Final    BOTTLES DRAWN AEROBIC AND ANAEROBIC Blood Culture results may not be optimal due to an inadequate volume of blood received in culture bottles   Culture  Setup Time   Final    GRAM POSITIVE COCCI IN CLUSTERS IN BOTH AEROBIC AND ANAEROBIC BOTTLES Organism ID to follow CRITICAL RESULT CALLED TO, READ BACK BY AND VERIFIED WITH: PHARMD ALEX LAWLESS 03/21/21'@1143'  BY TW Performed at [facility], [place] [address], [place], [place] [address]    Culture STAPHYLOCOCCUS AUREUS (A)  Final   Report Status 03/23/2021 FINAL  Final   Organism ID, Bacteria STAPHYLOCOCCUS AUREUS  Final      Susceptibility   Staphylococcus aureus - MIC*    CIPROFLOXACIN <=0.5 SENSITIVE Sensitive     ERYTHROMYCIN >=8 RESISTANT Resistant     GENTAMICIN <=0.5 SENSITIVE Sensitive     OXACILLIN <=0.25 SENSITIVE Sensitive     TETRACYCLINE >=16 RESISTANT Resistant     VANCOMYCIN <=0.5 SENSITIVE Sensitive     TRIMETH/SULFA <=10 SENSITIVE Sensitive     CLINDAMYCIN >=8 RESISTANT  Resistant     RIFAMPIN <=0.5 SENSITIVE Sensitive     Inducible Clindamycin NEGATIVE Sensitive     * STAPHYLOCOCCUS AUREUS  Blood Culture ID Panel (Reflexed)     Status: Abnormal   Collection Time: 03/20/21 12:56 PM  Result Value Ref Range Status   Enterococcus faecalis NOT DETECTED NOT DETECTED Final   Enterococcus Faecium NOT DETECTED NOT DETECTED Final   Listeria monocytogenes NOT DETECTED NOT DETECTED Final   Staphylococcus species DETECTED (A) NOT DETECTED Final    Comment: CRITICAL RESULT CALLED TO, READ BACK BY AND VERIFIED WITH: PHARMD ALEX LAWLESS 03/21/21'@1143'  BY TW    Staphylococcus aureus (BCID) DETECTED (A) NOT DETECTED Final    Comment: CRITICAL RESULT CALLED TO, READ BACK BY AND VERIFIED WITH: PHARMD ALEX LAWLESS 03/21/21'@11423'  BY TW    Staphylococcus epidermidis NOT DETECTED NOT DETECTED Final   Staphylococcus lugdunensis NOT DETECTED NOT DETECTED Final   Streptococcus species NOT DETECTED NOT DETECTED Final   Streptococcus agalactiae NOT DETECTED NOT DETECTED Final   Streptococcus pneumoniae NOT DETECTED NOT DETECTED Final   Streptococcus pyogenes NOT DETECTED NOT DETECTED Final   A.calcoaceticus-baumannii NOT DETECTED NOT DETECTED Final   Bacteroides fragilis NOT DETECTED NOT DETECTED Final   Enterobacterales NOT DETECTED NOT DETECTED Final   Enterobacter cloacae complex NOT DETECTED NOT DETECTED Final   Escherichia coli NOT DETECTED NOT DETECTED Final   Klebsiella aerogenes NOT DETECTED NOT DETECTED Final   Klebsiella oxytoca NOT DETECTED NOT DETECTED Final   Klebsiella pneumoniae NOT DETECTED NOT DETECTED Final   Proteus species NOT DETECTED NOT DETECTED Final   Salmonella species NOT DETECTED NOT DETECTED Final   Serratia marcescens NOT DETECTED NOT DETECTED Final   Haemophilus influenzae NOT DETECTED NOT DETECTED Final   Neisseria meningitidis NOT DETECTED NOT DETECTED Final   Pseudomonas aeruginosa NOT DETECTED NOT DETECTED Final   Stenotrophomonas maltophilia  NOT DETECTED NOT DETECTED Final   Candida albicans NOT DETECTED NOT DETECTED Final   Candida auris NOT DETECTED NOT DETECTED Final   Candida glabrata NOT DETECTED NOT DETECTED Final   Candida krusei NOT DETECTED NOT DETECTED Final   Candida parapsilosis NOT DETECTED NOT DETECTED Final   Candida tropicalis NOT DETECTED NOT DETECTED Final   Cryptococcus neoformans/gattii NOT DETECTED NOT DETECTED Final   Meth  resistant mecA/C and MREJ NOT DETECTED NOT DETECTED Final    Comment: Performed at [facility], [place] [address] [place], [place] [address]  Culture, blood (routine x 2)     Status: None (Preliminary result)   Collection Time: 03/22/21  8:34 AM   Specimen: BLOOD RIGHT HAND  Result Value Ref Range Status   Specimen Description BLOOD RIGHT HAND  Final   Special Requests   Final    BOTTLES DRAWN AEROBIC AND ANAEROBIC Blood Culture adequate volume   Culture   Final    NO GROWTH 2 DAYS Performed at [facility], [place] [address], [place], [place] [address]    Report Status PENDING  Incomplete  Culture, blood (routine x 2)     Status: None (Preliminary result)   Collection Time: 03/22/21  8:42 AM   Specimen: BLOOD RIGHT HAND  Result Value Ref Range Status   Specimen Description BLOOD RIGHT HAND  Final   Special Requests   Final    BOTTLES DRAWN AEROBIC AND ANAEROBIC Blood Culture results may not be optimal due to an inadequate volume of blood received in culture bottles   Culture   Final    NO GROWTH 2 DAYS Performed at [facility], [place] [address], [place], [place] [address]    Report Status PENDING  Incomplete         Radiology Studies: IR REMOVAL TUN ACCESS W/ PORT W/O FL MOD SED  Result Date: 03/24/2021 CLINICAL DATA:  80 year old male with history of stroke and multiple myeloma, currently admitted with bacteremia. Port removal requested for central venous line holiday. EXAM: REMOVAL OF IMPLANTED TUNNELED PORT-A-CATH MEDICATIONS: None. ANESTHESIA/SEDATION:  Moderate (conscious) sedation was employed during this procedure. A total of Versed 0.5 mg and Fentanyl 25 mcg was administered intravenously. Moderate Sedation Time: 10 minutes. The patient's level of consciousness and vital signs were monitored continuously by radiology nursing throughout the procedure under my direct supervision. FLUOROSCOPY TIME:  None PROCEDURE: Informed written consent was obtained from the patient after a discussion of the risk, benefits and alternatives to the procedure. The patient was positioned supine on the fluoroscopy table and the right chest Port-A-Cath site was prepped with chlorhexidine. A sterile gown and gloves were worn during the procedure. Local anesthesia was provided with 1% lidocaine with epinephrine. A timeout was performed prior to the initiation of the procedure. An incision was made overlying the Port-A-Cath with a #15 scalpel. Utilizing sharp and blunt dissection, the Port-A-Cath was removed completely. The pocked was irrigated with sterile saline. Wound closure was performed with interrupted deep dermal 3 0 Vicryl sutures and Dermabond. A dressing was placed. The patient tolerated the procedure well without immediate post procedural complication. FINDINGS: Successful removal of implant Port-A-Cath without immediate post procedural complication. IMPRESSION: Successful removal of implanted Port-A-Cath. Ruthann Cancer, MD [facility] Radiology Electronically Signed   By: Ruthann Cancer MD   On: 03/24/2021 12:10        Scheduled Meds:  acetaminophen  650 mg Oral BID   chlorhexidine       Chlorhexidine Gluconate Cloth  6 each Topical Daily   collagenase   Topical Daily   darbepoetin (ARANESP) injection - NON-DIALYSIS  40 mcg Subcutaneous Q Thu-1800   fentaNYL       ferrous sulfate  325 mg Oral Q breakfast   lidocaine-EPINEPHrine       midazolam       midodrine  5 mg Oral  BID WC   pantoprazole (PROTONIX) IV  40  mg Intravenous Q24H   senna-docusate  2 tablet Oral BID   thiamine injection  100 mg Intravenous Daily   Or   thiamine  100 mg Oral Daily   vitamin B-12  1,000 mcg Oral Daily   Continuous Infusions:  sodium chloride      ceFAZolin (ANCEF) IV 2 g (03/24/21 1109)   metronidazole 500 mg (03/24/21 1134)     LOS: 4 days   Time spent:  58mn  PDomenic Polite MD [facility]   03/24/2021, 12:16 PM

## 2021-03-25 LAB — CBC
HCT: 27.3 % — ABNORMAL LOW (ref 39.0–52.0)
Hemoglobin: 9 g/dL — ABNORMAL LOW (ref 13.0–17.0)
MCH: 30.4 pg (ref 26.0–34.0)
MCHC: 33 g/dL (ref 30.0–36.0)
MCV: 92.2 fL (ref 80.0–100.0)
Platelets: 189 10*3/uL (ref 150–400)
RBC: 2.96 MIL/uL — ABNORMAL LOW (ref 4.22–5.81)
RDW: 17.7 % — ABNORMAL HIGH (ref 11.5–15.5)
WBC: 4.1 10*3/uL (ref 4.0–10.5)
nRBC: 0 % (ref 0.0–0.2)

## 2021-03-25 LAB — MAGNESIUM: Magnesium: 2.4 mg/dL (ref 1.7–2.4)

## 2021-03-25 LAB — BASIC METABOLIC PANEL
Anion gap: 8 (ref 5–15)
BUN: 33 mg/dL — ABNORMAL HIGH (ref 8–23)
CO2: 23 mmol/L (ref 22–32)
Calcium: 7.8 mg/dL — ABNORMAL LOW (ref 8.9–10.3)
Chloride: 108 mmol/L (ref 98–111)
Creatinine, Ser: 1.63 mg/dL — ABNORMAL HIGH (ref 0.61–1.24)
GFR, Estimated: 42 mL/min — ABNORMAL LOW (ref >60)
Glucose, Bld: 113 mg/dL — ABNORMAL HIGH (ref 70–99)
Potassium: 3.8 mmol/L (ref 3.5–5.1)
Sodium: 139 mmol/L (ref 135–145)

## 2021-03-25 MED ORDER — METOPROLOL TARTRATE 25 MG PO TABS
25.0000 mg | ORAL_TABLET | Freq: Two times a day (BID) | ORAL | Status: DC
  Administered 2021-03-25 – 2021-04-03 (×20): 25 mg via ORAL
  Filled 2021-03-25 (×20): qty 1

## 2021-03-25 NOTE — Progress Notes (Signed)
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  He has been hospitalized at [facility] multiple times in the last few months.  His right ankle pressure wound has been getting progressively worse with some drainage. In the ED patient was noted to be hypotensive with blood pressures in the 70-80s, hemoglobin was 5.4, creatinine was 2.5.  CT head noted old left basal ganglia infarcts.  Wife and daughter deny any blood in stools or black stools recently. 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 -Blood Cx w/ MSSA Bacteremia   Assessment & Plan:   MSSA bacteremia Right ankle wound, right ankle osteomyelitis, pressure wound-poa -Continue IV Ancef -Repeat blood cultures are negative  -Appreciate infectious disease input, recommending line holiday and port removal,  -Discussed with daughter and patient's spouse, spouse would like treatment for bacteremia, agreeable to port removal, this was completed 8/6 -Continue wound care -Multiple discussions with family suggesting palliative and comfort focused care being most appropriate in this patient  -Palliative team following -If family continues to insist on full scope of treatment, will need PICC line placed in 1 to 2 days to complete 4-6-week course of IV Ancef  Acute on chronic anemia -Suspect this is multifactorial, has underlying multiple myeloma, chronic kidney disease, no history of melena or hematochezia however he was heme positive. -Transfused 2 units of PRBC, anemia  panel suggestive of chronic disease, no iron deficiency -He is not a candidate for endoscopic evaluation in the setting with acute CVA, bacteremia etc. -Hemoglobin is stable now, no evidence of active bleeding,  -Changed PPI to p.o., CBC in a.m.  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 -Now stable, IV fluids discontinued  Dysphagia -SLP evaluation ongoing, diet resumed  Multiple myeloma -On Revlimid at baseline, hold this  Hypokalemia -Replaced  Ethics -This is a chronically ill elderly male who is bedbound, right hemiplegia, pressure wounds with significant functional decline over the last 6 months to a year, multiple myeloma, stage IV kidney disease, now with acute CVA, bacteremia, severe anemia, protein calorie malnutrition. -Discussed overall poor prognosis with patient's daughter and spouse, recommended DNR and symptom/comfort focused care -Palliative consult appreciated, family requests full scope of care   DVT prophylaxis: SCDs Code Status: Full code Family Communication: No family at bedside, called and updated daughter [facility] and spouse 8/6 Disposition Plan:  Status is: Inpatient  Remains inpatient appropriate because:Inpatient level of care appropriate due to severity of  illness  Dispo: The patient is from: Home  Anticipated d/c is to: Home, he has been total care              Patient currently is not medically stable to d/c.   Difficult to place patient No   Consultants:  Neurology Infectious disease  Procedures:   Antimicrobials:    Subjective: -Had Port-A-Cath removed yesterday, no events overnight, tolerating dysphagia diet  Objective: Vitals:   03/24/21 2018 03/24/21 2330 03/25/21 0346 03/25/21 0816  BP: 128/64 (!) 143/68 (!) 152/72 126/69  Pulse: 83 84 100 100  Resp: '16 16 18 18  ' Temp: 97.6 F (36.4 C) 97.8 F (36.6 C) 97.8 F (36.6 C) 98.5 F (36.9 C)  TempSrc: Oral Oral Axillary Oral  SpO2: 100% 99% 98% 97%  Weight:        Intake/Output Summary (Last 24 hours) at 03/25/2021 1226 Last data filed at 03/25/2021 6063 Gross per 24 hour  Intake 100 ml  Output 850 ml  Net -750 ml   Filed Weights   03/20/21 1500  Weight: 75.9 kg    Examination:  General exam: Chronically ill elderly male, laying in bed, awake alert, dysarthric mumbles few sounds, no distress HEENT: Port-A-Cath removed, site with dressing CVS: S1-S2, regular rate rhythm Lungs: Clear anteriorly Abdomen: Soft, nontender, bowel sounds present  Extremities:  Right upper and lower extremity appears contracted, right ankle thickness wound with dressing  Neuro : Dysarthric, chronic right-sided deficits follows simple commands Psychiatry: Poor insight and judgment  Data Reviewed:   CBC: Recent Labs  Lab 03/20/21 1238 03/20/21 1311 03/21/21 0800 03/22/21 0020 03/23/21 0155 03/24/21 0043 03/25/21 0631  WBC 8.1  --  4.6 4.0 4.4 3.9* 4.1  NEUTROABS 5.8  --  3.6  --   --   --   --   HGB 5.7*   < > 7.8* 7.7* 8.0* 8.5* 9.0*  HCT 19.0*   < > 24.1* 23.7* 24.4* 26.3* 27.3*  MCV 101.1*  --  92.7 92.6 91.0 92.0 92.2  PLT 299  --  186 152 151 157 189   < > = values in this interval not displayed.   Basic Metabolic Panel: Recent Labs  Lab  03/20/21 1603 03/21/21 0800 03/22/21 0020 03/23/21 0155 03/24/21 0043 03/25/21 0631  NA  --  134* 136 137 137 139  K  --  3.6 3.6 2.8* 2.8* 3.8  CL  --  95* 100 105 106 108  CO2  --  '27 23 23 22 23  ' GLUCOSE  --  95 79 106* 87 113*  BUN  --  58* 51* 41* 35* 33*  CREATININE  --  2.30* 1.98* 1.94* 1.73* 1.63*  CALCIUM  --  8.2* 7.8* 7.5* 7.6* 7.8*  MG 2.6*  --   --   --   --  2.4  PHOS 4.6  --   --   --   --   --    GFR: Estimated Creatinine Clearance: 37.5 mL/min (A) (by C-G formula based on SCr of 1.63 mg/dL (H)). Liver Function Tests: Recent Labs  Lab 03/20/21 1238 03/21/21 0800  AST 36 17  ALT 39 29  ALKPHOS 58 46  BILITOT 1.0 1.5*  PROT 5.7* 4.8*  ALBUMIN 2.2* 1.9*   Recent Labs  Lab 03/20/21 1603  LIPASE 23   No results for input(s): AMMONIA in the last 168 hours. Coagulation Profile: Recent Labs  Lab 03/20/21 1238  INR 1.9*   Cardiac Enzymes: No results for input(s): CKTOTAL, CKMB, CKMBINDEX, TROPONINI in the  last 168 hours. BNP (last 3 results) No results for input(s): PROBNP in the last 8760 hours. HbA1C: No results for input(s): HGBA1C in the last 72 hours.  CBG: No results for input(s): GLUCAP in the last 168 hours. Lipid Profile: No results for input(s): CHOL, HDL, LDLCALC, TRIG, CHOLHDL, LDLDIRECT in the last 72 hours.  Thyroid Function Tests: No results for input(s): TSH, T4TOTAL, FREET4, T3FREE, THYROIDAB in the last 72 hours. Anemia Panel: No results for input(s): VITAMINB12, FOLATE, FERRITIN, TIBC, IRON, RETICCTPCT in the last 72 hours.  Urine analysis:    Component Value Date/Time   COLORURINE YELLOW 03/20/2021 1840   APPEARANCEUR HAZY (A) 03/20/2021 1840   LABSPEC 1.013 03/20/2021 1840   PHURINE 5.0 03/20/2021 1840   GLUCOSEU NEGATIVE 03/20/2021 1840   HGBUR NEGATIVE 03/20/2021 1840   BILIRUBINUR NEGATIVE 03/20/2021 1840   KETONESUR NEGATIVE 03/20/2021 1840   PROTEINUR NEGATIVE 03/20/2021 1840   NITRITE NEGATIVE 03/20/2021 1840    LEUKOCYTESUR NEGATIVE 03/20/2021 1840   Sepsis Labs: '@LABRCNTIP' (procalcitonin:4,lacticidven:4)  ) Recent Results (from the past 240 hour(s))  Resp Panel by RT-PCR (Flu A&B, Covid) Nasopharyngeal Swab     Status: None   Collection Time: 03/20/21 12:27 PM   Specimen: Nasopharyngeal Swab; Nasopharyngeal(NP) swabs in vial transport medium  Result Value Ref Range Status   SARS Coronavirus 2 by RT PCR NEGATIVE NEGATIVE Final    Comment: (NOTE) SARS-CoV-2 target nucleic acids are NOT DETECTED.  The SARS-CoV-2 RNA is generally detectable in upper respiratory specimens during the acute phase of infection. The lowest concentration of SARS-CoV-2 viral copies this assay can detect is 138 copies/mL. A negative result does not preclude SARS-Cov-2 infection and should not be used as the sole basis for treatment or other patient management decisions. A negative result may occur with  improper specimen collection/handling, submission of specimen other than nasopharyngeal swab, presence of viral mutation(s) within the areas targeted by this assay, and inadequate number of viral copies(<138 copies/mL). A negative result must be combined with clinical observations, patient history, and epidemiological information. The expected result is Negative.  Fact Sheet for Patients:  EntrepreneurPulse.com.au  Fact Sheet for Healthcare Providers:  IncredibleEmployment.be  This test is no t yet approved or cleared by the [place] FDA and  has been authorized for detection and/or diagnosis of SARS-CoV-2 by FDA under an Emergency Use Authorization (EUA). This EUA will remain  in effect (meaning this test can be used) for the duration of the COVID-19 declaration under Section 564(b)(1) of the Act, 21 U.S.C.section 360bbb-3(b)(1), unless the authorization is terminated  or revoked sooner.       Influenza A by PCR NEGATIVE NEGATIVE Final   Influenza B by PCR NEGATIVE  NEGATIVE Final    Comment: (NOTE) The Xpert Xpress SARS-CoV-2/FLU/RSV plus assay is intended as an aid in the diagnosis of influenza from Nasopharyngeal swab specimens and should not be used as a sole basis for treatment. Nasal washings and aspirates are unacceptable for Xpert Xpress SARS-CoV-2/FLU/RSV testing.  Fact Sheet for Patients: EntrepreneurPulse.com.au  Fact Sheet for Healthcare Providers: IncredibleEmployment.be  This test is not yet approved or cleared by the [place] FDA and has been authorized for detection and/or diagnosis of SARS-CoV-2 by FDA under an Emergency Use Authorization (EUA). This EUA will remain in effect (meaning this test can be used) for the duration of the COVID-19 declaration under Section 564(b)(1) of the Act, 21 U.S.C. section 360bbb-3(b)(1), unless the authorization is terminated or revoked.  Performed at [facility],  [address] [place], [place] [address]   Blood Culture (routine x 2)     Status: Abnormal   Collection Time: 03/20/21 12:56 PM   Specimen: BLOOD RIGHT FOREARM  Result Value Ref Range Status   Specimen Description BLOOD RIGHT FOREARM  Final   Special Requests   Final    BOTTLES DRAWN AEROBIC AND ANAEROBIC Blood Culture results may not be optimal due to an inadequate volume of blood received in culture bottles   Culture  Setup Time   Final    GRAM POSITIVE COCCI IN CLUSTERS IN BOTH AEROBIC AND ANAEROBIC BOTTLES Organism ID to follow CRITICAL RESULT CALLED TO, READ BACK BY AND VERIFIED WITH: PHARMD ALEX LAWLESS 03/21/21'@1143'  BY TW Performed at [facility], [place] [address], [place], [place] [address]    Culture STAPHYLOCOCCUS AUREUS (A)  Final   Report Status 03/23/2021 FINAL  Final   Organism ID, Bacteria STAPHYLOCOCCUS AUREUS  Final      Susceptibility   Staphylococcus aureus - MIC*    CIPROFLOXACIN <=0.5 SENSITIVE Sensitive     ERYTHROMYCIN >=8 RESISTANT Resistant      GENTAMICIN <=0.5 SENSITIVE Sensitive     OXACILLIN <=0.25 SENSITIVE Sensitive     TETRACYCLINE >=16 RESISTANT Resistant     VANCOMYCIN <=0.5 SENSITIVE Sensitive     TRIMETH/SULFA <=10 SENSITIVE Sensitive     CLINDAMYCIN >=8 RESISTANT Resistant     RIFAMPIN <=0.5 SENSITIVE Sensitive     Inducible Clindamycin NEGATIVE Sensitive     * STAPHYLOCOCCUS AUREUS  Blood Culture ID Panel (Reflexed)     Status: Abnormal   Collection Time: 03/20/21 12:56 PM  Result Value Ref Range Status   Enterococcus faecalis NOT DETECTED NOT DETECTED Final   Enterococcus Faecium NOT DETECTED NOT DETECTED Final   Listeria monocytogenes NOT DETECTED NOT DETECTED Final   Staphylococcus species DETECTED (A) NOT DETECTED Final    Comment: CRITICAL RESULT CALLED TO, READ BACK BY AND VERIFIED WITH: PHARMD ALEX LAWLESS 03/21/21'@1143'  BY TW    Staphylococcus aureus (BCID) DETECTED (A) NOT DETECTED Final    Comment: CRITICAL RESULT CALLED TO, READ BACK BY AND VERIFIED WITH: PHARMD ALEX LAWLESS 03/21/21'@11423'  BY TW    Staphylococcus epidermidis NOT DETECTED NOT DETECTED Final   Staphylococcus lugdunensis NOT DETECTED NOT DETECTED Final   Streptococcus species NOT DETECTED NOT DETECTED Final   Streptococcus agalactiae NOT DETECTED NOT DETECTED Final   Streptococcus pneumoniae NOT DETECTED NOT DETECTED Final   Streptococcus pyogenes NOT DETECTED NOT DETECTED Final   A.calcoaceticus-baumannii NOT DETECTED NOT DETECTED Final   Bacteroides fragilis NOT DETECTED NOT DETECTED Final   Enterobacterales NOT DETECTED NOT DETECTED Final   Enterobacter cloacae complex NOT DETECTED NOT DETECTED Final   Escherichia coli NOT DETECTED NOT DETECTED Final   Klebsiella aerogenes NOT DETECTED NOT DETECTED Final   Klebsiella oxytoca NOT DETECTED NOT DETECTED Final   Klebsiella pneumoniae NOT DETECTED NOT DETECTED Final   Proteus species NOT DETECTED NOT DETECTED Final   Salmonella species NOT DETECTED NOT DETECTED Final   Serratia marcescens  NOT DETECTED NOT DETECTED Final   Haemophilus influenzae NOT DETECTED NOT DETECTED Final   Neisseria meningitidis NOT DETECTED NOT DETECTED Final   Pseudomonas aeruginosa NOT DETECTED NOT DETECTED Final   Stenotrophomonas maltophilia NOT DETECTED NOT DETECTED Final   Candida albicans NOT DETECTED NOT DETECTED Final   Candida auris NOT DETECTED NOT DETECTED Final   Candida glabrata NOT DETECTED NOT DETECTED Final   Candida krusei NOT DETECTED NOT DETECTED Final  Candida parapsilosis NOT DETECTED NOT DETECTED Final   Candida tropicalis NOT DETECTED NOT DETECTED Final   Cryptococcus neoformans/gattii NOT DETECTED NOT DETECTED Final   Meth resistant mecA/C and MREJ NOT DETECTED NOT DETECTED Final    Comment: Performed at [facility], [address], [place], [place] [address]  Culture, blood (routine x 2)     Status: None (Preliminary result)   Collection Time: 03/22/21  8:34 AM   Specimen: BLOOD RIGHT HAND  Result Value Ref Range Status   Specimen Description BLOOD RIGHT HAND  Final   Special Requests   Final    BOTTLES DRAWN AEROBIC AND ANAEROBIC Blood Culture adequate volume   Culture   Final    NO GROWTH 3 DAYS Performed at [facility], [place] [address], [place], [place] [address]    Report Status PENDING  Incomplete  Culture, blood (routine x 2)     Status: None (Preliminary result)   Collection Time: 03/22/21  8:42 AM   Specimen: BLOOD RIGHT HAND  Result Value Ref Range Status   Specimen Description BLOOD RIGHT HAND  Final   Special Requests   Final    BOTTLES DRAWN AEROBIC AND ANAEROBIC Blood Culture results may not be optimal due to an inadequate volume of blood received in culture bottles   Culture   Final    NO GROWTH 3 DAYS Performed at [facility], [place] [address] [place], [place] [address]    Report Status PENDING  Incomplete         Radiology Studies: IR REMOVAL TUN ACCESS W/ PORT W/O FL MOD SED  Result Date: 03/24/2021 CLINICAL DATA:   80 year old male with history of stroke and multiple myeloma, currently admitted with bacteremia. Port removal requested for central venous line holiday. EXAM: REMOVAL OF IMPLANTED TUNNELED PORT-A-CATH MEDICATIONS: None. ANESTHESIA/SEDATION: Moderate (conscious) sedation was employed during this procedure. A total of Versed 0.5 mg and Fentanyl 25 mcg was administered intravenously. Moderate Sedation Time: 10 minutes. The patient's level of consciousness and vital signs were monitored continuously by radiology nursing throughout the procedure under my direct supervision. FLUOROSCOPY TIME:  None PROCEDURE: Informed written consent was obtained from the patient after a discussion of the risk, benefits and alternatives to the procedure. The patient was positioned supine on the fluoroscopy table and the right chest Port-A-Cath site was prepped with chlorhexidine. A sterile gown and gloves were worn during the procedure. Local anesthesia was provided with 1% lidocaine with epinephrine. A timeout was performed prior to the initiation of the procedure. An incision was made overlying the Port-A-Cath with a #15 scalpel. Utilizing sharp and blunt dissection, the Port-A-Cath was removed completely. The pocked was irrigated with sterile saline. Wound closure was performed with interrupted deep dermal 3 0 Vicryl sutures and Dermabond. A dressing was placed. The patient tolerated the procedure well without immediate post procedural complication. FINDINGS: Successful removal of implant Port-A-Cath without immediate post procedural complication. IMPRESSION: Successful removal of implanted Port-A-Cath. Ruthann Cancer, MD [facility] Electronically Signed   By: Ruthann Cancer MD   On: 03/24/2021 12:10        Scheduled Meds:  acetaminophen  650 mg Oral BID   Chlorhexidine Gluconate Cloth  6 each Topical Daily   collagenase   Topical Daily   darbepoetin (ARANESP)  injection - NON-DIALYSIS  40 mcg Subcutaneous Q Thu-1800   ferrous sulfate  325 mg Oral Q breakfast   metoprolol tartrate  25  mg Oral BID   pantoprazole  40 mg Oral Q1200   senna-docusate  2 tablet Oral BID   thiamine injection  100 mg Intravenous Daily   Or   thiamine  100 mg Oral Daily   vitamin B-12  1,000 mcg Oral Daily   Continuous Infusions:  sodium chloride      ceFAZolin (ANCEF) IV 2 g (03/25/21 0549)   metronidazole Stopped (03/25/21 0828)     LOS: 5 days   Time spent:  20mn  PDomenic Polite MD [facility]   03/25/2021, 12:26 PM

## 2021-03-26 ENCOUNTER — Inpatient Hospital Stay: Payer: Self-pay

## 2021-03-26 ENCOUNTER — Other Ambulatory Visit (HOSPITAL_COMMUNITY): Payer: Medicare Other

## 2021-03-26 ENCOUNTER — Other Ambulatory Visit: Payer: Self-pay | Admitting: Pharmacist

## 2021-03-26 LAB — CBC
HCT: 27.2 % — ABNORMAL LOW (ref 39.0–52.0)
Hemoglobin: 8.2 g/dL — ABNORMAL LOW (ref 13.0–17.0)
MCH: 29.7 pg (ref 26.0–34.0)
MCHC: 30.1 g/dL (ref 30.0–36.0)
MCV: 98.6 fL (ref 80.0–100.0)
Platelets: 165 10*3/uL (ref 150–400)
RBC: 2.76 MIL/uL — ABNORMAL LOW (ref 4.22–5.81)
RDW: 18 % — ABNORMAL HIGH (ref 11.5–15.5)
WBC: 2.9 10*3/uL — ABNORMAL LOW (ref 4.0–10.5)
nRBC: 1 % — ABNORMAL HIGH (ref 0.0–0.2)

## 2021-03-26 LAB — BASIC METABOLIC PANEL
Anion gap: 6 (ref 5–15)
BUN: 29 mg/dL — ABNORMAL HIGH (ref 8–23)
CO2: 22 mmol/L (ref 22–32)
Calcium: 7.6 mg/dL — ABNORMAL LOW (ref 8.9–10.3)
Chloride: 107 mmol/L (ref 98–111)
Creatinine, Ser: 1.39 mg/dL — ABNORMAL HIGH (ref 0.61–1.24)
GFR, Estimated: 51 mL/min — ABNORMAL LOW (ref >60)
Glucose, Bld: 81 mg/dL (ref 70–99)
Potassium: 4.1 mmol/L (ref 3.5–5.1)
Sodium: 135 mmol/L (ref 135–145)

## 2021-03-26 NOTE — Progress Notes (Addendum)
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  He has been hospitalized at [facility] multiple times in the last few months.  His right ankle pressure wound has been getting progressively worse with some drainage. In the ED patient was noted to be hypotensive with blood pressures in the 70-80s, hemoglobin was 5.4, creatinine was 2.5.  CT head noted old left basal ganglia infarcts.  Wife and daughter deny any blood in stools or black stools recently. 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 -Blood Cx w/ MSSA Bacteremia   Assessment & Plan:   MSSA bacteremia Right ankle wound, right ankle osteomyelitis, pressure wound-poa -Continue IV Ancef -Repeat blood cultures are negative  -Appreciate infectious disease input, recommending line holiday and port removal,  -Discussed with daughter and patient's spouse, spouse would like treatment for bacteremia, agreeable to port removal, this was completed 8/6 -Continue wound care -Multiple discussions with family suggesting palliative and comfort focused care being most appropriate in this patient  -Palliative team following, plan for full scope of Rx for now, will place PICC line, ID recommends 4 weeks of IV Ancef  Acute on chronic anemia -Suspect this is multifactorial, has underlying multiple myeloma, chronic kidney disease, no history of melena or hematochezia however he was heme positive. -Transfused 2 units of PRBC, anemia panel suggestive of chronic disease, no iron  deficiency -He is not a candidate for endoscopic evaluation in the setting with acute CVA, bacteremia etc. -Hemoglobin is stable now, no evidence of active bleeding,  -Changed PPI to p.o., CBC in a.m.  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 -Now stable, IV fluids discontinued  Dysphagia -SLP evaluation ongoing, continue dysphagia diet  Multiple myeloma -On Revlimid at baseline, hold this  Hypokalemia -Replaced  Severe protein calorie malnutrition -Add supplements as tolerated  Ethics -This is a chronically ill elderly male who is bedbound, right hemiplegia, pressure wounds with significant functional decline over the last 6 months to a year, multiple myeloma, stage IV kidney disease, now with acute CVA, bacteremia, severe anemia, protein calorie malnutrition. -Discussed overall poor prognosis with patient's daughter and spouse, recommended DNR and symptom/comfort focused care -Palliative consult appreciated, family requests full scope of care   DVT prophylaxis: SCDs Code Status: Full code Family Communication: No family at bedside, called and updated daughter [name] and spouse 8/6 Disposition Plan:  Status is: Inpatient  Remains inpatient appropriate because:Inpatient level of care  appropriate due to severity of illness  Dispo: The patient is from: Home  Anticipated d/c is to: Home, he has been total care              Patient currently is not medically stable to d/c.   Difficult to place patient No   Consultants:  Neurology Infectious disease  Procedures:   Antimicrobials:    Subjective: -Feels okay, staff denies any issues overnight, tolerating dysphagia diet  Objective: Vitals:   03/25/21 2335 03/26/21 0400 03/26/21 0714 03/26/21 1207  BP: 106/86 (!) 112/57 101/74 (!) 118/102  Pulse: 65 68 75 72  Resp: _0 Temp: 98.4 F (36.9 C) 98 F (36.7 C) 98 F (36.7 C) 97.9 F (36.6 C)  TempSrc: Oral Axillary Axillary Axillary  SpO2: 98% 95% 99% 100%  Weight:        Intake/Output Summary (Last 24 hours) at 03/26/2021 1418 Last data filed at 03/26/2021 1300 Gross per 24 hour  Intake 1060 ml  Output 1100 ml  Net -40 ml   Filed Weights   03/20/21 1500  Weight: 75.9 kg    Examination:  General exam: Chronically ill elderly male laying in bed, awake alert, dysarthric, mumbles a few words, no distress HEENT: Port-A-Cath removed, site with dressing CVS: S1-S2, regular rate rhythm Lungs: Clear anteriorly Abdomen: Soft, nontender, bowel sounds present  Extremities:  Right upper and lower extremity appears contracted, right ankle full thickness wound with dressing  Neuro : Dysarthric, chronic right-sided deficits follows simple commands Psychiatry: Poor insight and judgment  Data Reviewed:   CBC: Recent Labs  Lab 03/20/21 1238 03/20/21 1311 03/21/21 0800 03/22/21 0020 03/23/21 0155 03/24/21 0043 03/25/21 0631 03/26/21 0225  WBC 8.1  --  4.6 4.0 4.4 3.9* 4.1 2.9*  NEUTROABS 5.8  --  3.6  --   --   --   --   --   HGB 5.7*   < > 7.8* 7.7* 8.0* 8.5* 9.0* 8.2*  HCT 19.0*   < > 24.1* 23.7* 24.4* 26.3* 27.3* 27.2*  MCV 101.1*  --  92.7 92.6 91.0 92.0 92.2 98.6  PLT 299  --  186 152 151 157 189 165   < > = values in this  interval not displayed.   Basic Metabolic Panel: Recent Labs  Lab 03/20/21 1603 03/21/21 0800 03/22/21 0020 03/23/21 0155 03/24/21 0043 03/25/21 0631 03/26/21 0225  NA  --    < > 136 137 137 139 135  K  --    < > 3.6 2.8* 2.8* 3.8 4.1  CL  --    < > 100 105 106 108 107  CO2  --    < > _1 GLUCOSE  --    < > 79 106* 87 113* 81  BUN  --    < > 51* 41* 35* 33* 29*  CREATININE  --    < > 1.98* 1.94* 1.73* 1.63* 1.39*  CALCIUM  --    < > 7.8* 7.5* 7.6* 7.8* 7.6*  MG 2.6*  --   --   --   --  2.4  --   PHOS 4.6  --   --   --   --   --   --    < > = values in this interval not displayed.   GFR: Estimated Creatinine Clearance: 44 mL/min (A) (by C-G formula based on SCr of 1.39 mg/dL (H)). Liver Function Tests: Recent Labs  Lab 03/20/21 1238 03/21/21 0800  AST 36 17  ALT 39 29  ALKPHOS 58 46  BILITOT 1.0 1.5*  PROT 5.7* 4.8*  ALBUMIN 2.2* 1.9*   Recent Labs  Lab 03/20/21 1603  LIPASE 23   No results for input(s): AMMONIA in the last 168 hours. Coagulation Profile: Recent Labs  Lab 03/20/21 1238  INR 1.9*   Cardiac Enzymes: No results for input(s): CKTOTAL, CKMB, CKMBINDEX, TROPONINI in the last 168 hours. BNP (last 3 results) No results for input(s): PROBNP in the last 8760 hours. HbA1C: No results for input(s): HGBA1C in the last 72 hours.  CBG: No results for input(s): GLUCAP in the last 168 hours. Lipid Profile: No results for input(s): CHOL, HDL, LDLCALC, TRIG, CHOLHDL, LDLDIRECT in the last 72 hours.  Thyroid Function Tests: No results for input(s): TSH, T4TOTAL, FREET4, T3FREE, THYROIDAB in the last 72 hours. Anemia Panel: No results for input(s): VITAMINB12, FOLATE, FERRITIN, TIBC, IRON, RETICCTPCT in the last 72 hours.  Urine analysis:    Component Value Date/Time   COLORURINE YELLOW 03/20/2021 1840   APPEARANCEUR HAZY (A) 03/20/2021 1840   LABSPEC 1.013 03/20/2021 1840   PHURINE 5.0 03/20/2021 1840   GLUCOSEU NEGATIVE 03/20/2021  1840   HGBUR NEGATIVE 03/20/2021 1840   BILIRUBINUR NEGATIVE 03/20/2021 1840   KETONESUR NEGATIVE 03/20/2021 1840   PROTEINUR NEGATIVE 03/20/2021 1840   NITRITE NEGATIVE 03/20/2021 1840   LEUKOCYTESUR NEGATIVE 03/20/2021 1840   Sepsis Labs: _0 (procalcitonin:4,lacticidven:4)  ) Recent Results (from the past 240 hour(s))  Resp Panel by RT-PCR (Flu A&B, Covid) Nasopharyngeal Swab     Status: None   Collection Time: 03/20/21 12:27 PM   Specimen: Nasopharyngeal Swab; Nasopharyngeal(NP) swabs in vial transport medium  Result Value Ref Range Status   SARS Coronavirus 2 by RT PCR NEGATIVE NEGATIVE Final    Comment: (NOTE) SARS-CoV-2 target nucleic acids are NOT DETECTED.  The SARS-CoV-2 RNA is generally detectable in upper respiratory specimens during the acute phase of infection. The lowest concentration of SARS-CoV-2 viral copies this assay can detect is 138 copies/mL. A negative result does not preclude SARS-Cov-2 infection and should not be used as the sole basis for treatment or other patient management decisions. A negative result may occur with  improper specimen collection/handling, submission of specimen other than nasopharyngeal swab, presence of viral mutation(s) within the areas targeted by this assay, and inadequate number of viral copies(<138 copies/mL). A negative result must be combined with clinical observations, patient history, and epidemiological information. The expected result is Negative.  Fact Sheet for Patients:  EntrepreneurPulse.com.au  Fact Sheet for Healthcare Providers:  IncredibleEmployment.be  This test is no t yet approved or cleared by the [place] FDA and  has been authorized for detection and/or diagnosis of SARS-CoV-2 by FDA under an Emergency Use Authorization (EUA). This EUA will remain  in effect (meaning this test can be used) for the duration of the COVID-19 declaration under Section  564(b)(1) of the Act, 21 U.S.C.section 360bbb-3(b)(1), unless the authorization is terminated  or revoked sooner.       Influenza A by PCR NEGATIVE NEGATIVE Final   Influenza B by PCR NEGATIVE NEGATIVE Final    Comment: (NOTE) The Xpert Xpress SARS-CoV-2/FLU/RSV plus assay is intended as an aid in the diagnosis of influenza from Nasopharyngeal swab specimens and should not be used as a sole basis for treatment. Nasal washings and aspirates are unacceptable for Xpert Xpress SARS-CoV-2/FLU/RSV testing.  Fact Sheet for Patients: EntrepreneurPulse.com.au  Fact Sheet for Healthcare Providers: IncredibleEmployment.be  This test is not yet approved or cleared by the [place]  States FDA and has been authorized for detection and/or diagnosis of SARS-CoV-2 by FDA under an Emergency Use Authorization (EUA). This EUA will remain in effect (meaning this test can be used) for the duration of the COVID-19 declaration under Section 564(b)(1) of the Act, 21 U.S.C. section 360bbb-3(b)(1), unless the authorization is terminated or revoked.  Performed at [facility], [place] [address] [place], [place] [address]   Blood Culture (routine x 2)     Status: Abnormal   Collection Time: 03/20/21 12:56 PM   Specimen: BLOOD RIGHT FOREARM  Result Value Ref Range Status   Specimen Description BLOOD RIGHT FOREARM  Final   Special Requests   Final    BOTTLES DRAWN AEROBIC AND ANAEROBIC Blood Culture results may not be optimal due to an inadequate volume of blood received in culture bottles   Culture  Setup Time   Final    GRAM POSITIVE COCCI IN CLUSTERS IN BOTH AEROBIC AND ANAEROBIC BOTTLES Organism ID to follow CRITICAL RESULT CALLED TO, READ BACK BY AND VERIFIED WITH: PHARMD ALEX LAWLESS 03/21/21_0  BY TW Performed at [facility], [place] [address], [place], [place] [address]    Culture STAPHYLOCOCCUS AUREUS (A)  Final   Report Status 03/23/2021 FINAL  Final    Organism ID, Bacteria STAPHYLOCOCCUS AUREUS  Final      Susceptibility   Staphylococcus aureus - MIC*    CIPROFLOXACIN <=0.5 SENSITIVE Sensitive     ERYTHROMYCIN >=8 RESISTANT Resistant     GENTAMICIN <=0.5 SENSITIVE Sensitive     OXACILLIN <=0.25 SENSITIVE Sensitive     TETRACYCLINE >=16 RESISTANT Resistant     VANCOMYCIN <=0.5 SENSITIVE Sensitive     TRIMETH/SULFA <=10 SENSITIVE Sensitive     CLINDAMYCIN >=8 RESISTANT Resistant     RIFAMPIN <=0.5 SENSITIVE Sensitive     Inducible Clindamycin NEGATIVE Sensitive     * STAPHYLOCOCCUS AUREUS  Blood Culture ID Panel (Reflexed)     Status: Abnormal   Collection Time: 03/20/21 12:56 PM  Result Value Ref Range Status   Enterococcus faecalis NOT DETECTED NOT DETECTED Final   Enterococcus Faecium NOT DETECTED NOT DETECTED Final   Listeria monocytogenes NOT DETECTED NOT DETECTED Final   Staphylococcus species DETECTED (A) NOT DETECTED Final    Comment: CRITICAL RESULT CALLED TO, READ BACK BY AND VERIFIED WITH: PHARMD ALEX LAWLESS 03/21/21_1  BY TW    Staphylococcus aureus (BCID) DETECTED (A) NOT DETECTED Final    Comment: CRITICAL RESULT CALLED TO, READ BACK BY AND VERIFIED WITH: PHARMD ALEX LAWLESS 03/21/21_2  BY TW    Staphylococcus epidermidis NOT DETECTED NOT DETECTED Final   Staphylococcus lugdunensis NOT DETECTED NOT DETECTED Final   Streptococcus species NOT DETECTED NOT DETECTED Final   Streptococcus agalactiae NOT DETECTED NOT DETECTED Final   Streptococcus pneumoniae NOT DETECTED NOT DETECTED Final   Streptococcus pyogenes NOT DETECTED NOT DETECTED Final   A.calcoaceticus-baumannii NOT DETECTED NOT DETECTED Final   Bacteroides fragilis NOT DETECTED NOT DETECTED Final   Enterobacterales NOT DETECTED NOT DETECTED Final   Enterobacter cloacae complex NOT DETECTED NOT DETECTED Final   Escherichia coli NOT DETECTED NOT DETECTED Final   Klebsiella aerogenes NOT DETECTED NOT DETECTED Final   Klebsiella oxytoca NOT DETECTED NOT  DETECTED Final   Klebsiella pneumoniae NOT DETECTED NOT DETECTED Final   Proteus species NOT DETECTED NOT DETECTED Final   Salmonella species NOT DETECTED NOT DETECTED Final   Serratia marcescens NOT DETECTED NOT DETECTED Final   Haemophilus influenzae NOT DETECTED NOT DETECTED Final  Neisseria meningitidis NOT DETECTED NOT DETECTED Final   Pseudomonas aeruginosa NOT DETECTED NOT DETECTED Final   Stenotrophomonas maltophilia NOT DETECTED NOT DETECTED Final   Candida albicans NOT DETECTED NOT DETECTED Final   Candida auris NOT DETECTED NOT DETECTED Final   Candida glabrata NOT DETECTED NOT DETECTED Final   Candida krusei NOT DETECTED NOT DETECTED Final   Candida parapsilosis NOT DETECTED NOT DETECTED Final   Candida tropicalis NOT DETECTED NOT DETECTED Final   Cryptococcus neoformans/gattii NOT DETECTED NOT DETECTED Final   Meth resistant mecA/C and MREJ NOT DETECTED NOT DETECTED Final    Comment: Performed at [facility], [place] [address], [place], [place] [address]  Culture, blood (routine x 2)     Status: None (Preliminary result)   Collection Time: 03/22/21  8:34 AM   Specimen: BLOOD RIGHT HAND  Result Value Ref Range Status   Specimen Description BLOOD RIGHT HAND  Final   Special Requests   Final    BOTTLES DRAWN AEROBIC AND ANAEROBIC Blood Culture adequate volume   Culture   Final    NO GROWTH 3 DAYS Performed at [facility], [place] [address] [place], [place] [address]    Report Status PENDING  Incomplete  Culture, blood (routine x 2)     Status: None (Preliminary result)   Collection Time: 03/22/21  8:42 AM   Specimen: BLOOD RIGHT HAND  Result Value Ref Range Status   Specimen Description BLOOD RIGHT HAND  Final   Special Requests   Final    BOTTLES DRAWN AEROBIC AND ANAEROBIC Blood Culture results may not be optimal due to an inadequate volume of blood received in culture bottles   Culture   Final    NO GROWTH 3 DAYS Performed at [facility], [place] [address], [place], [place] [address]    Report Status PENDING  Incomplete     Scheduled Meds:  acetaminophen  650 mg Oral BID   Chlorhexidine Gluconate Cloth  6 each Topical Daily   collagenase   Topical Daily   darbepoetin (ARANESP) injection - NON-DIALYSIS  40 mcg Subcutaneous Q Thu-1800   ferrous sulfate  325 mg Oral Q breakfast   metoprolol tartrate  25 mg Oral BID   pantoprazole  40 mg Oral Q1200   senna-docusate  2 tablet Oral BID   thiamine injection  100 mg Intravenous Daily   Or   thiamine  100 mg Oral Daily   vitamin B-12  1,000 mcg Oral Daily   Continuous Infusions:  sodium chloride      ceFAZolin (ANCEF) IV 2 g (03/26/21 0545)     LOS: 6 days   Time spent:  4mn  PDomenic Polite MD [facility]   03/26/2021, 2:18 PM

## 2021-03-26 NOTE — Progress Notes (Signed)
Spoke to another PICC RN who assessed patient.Patient right arm, so swollen, poor skin turgor, painful to touch, unable to extent arm outward, no vein found suitable for  for PICC and midline. Secure chat MD Domenic Polite and as per recommendation to place central line with Interventional radiology. Patient is not a candidate for bedside PICC.

## 2021-03-26 NOTE — Progress Notes (Signed)
Patient assessed with [place] for IV start. R arm is very swollen with poor skin turgor. L arm restricted. No visible vein noted. Per daughter at bedside, patient had DVT on that R arm 17 yrs ago. Would recommend another [place] on the swollen R arm and a central line placement on chest or neck for IV access. RN made aware.

## 2021-03-26 NOTE — Progress Notes (Signed)
  Speech Language Pathology Treatment: Dysphagia;Cognitive-Linquistic (aphasia)  Patient Details Name: Scott Mcdowell MRN: [id number] DOB: 1941/03/16 Today's Date: 03/26/2021 Time: 7800-4471 SLP Time Calculation (min) (ACUTE ONLY): 36 min  Assessment / Plan / Recommendation Clinical Impression  Pt was seen for treatment with his daughter present. Pt's daughter reported that the pt demonstrated some residue/pocketing during a recent meal, but that the pt independently removed it with his finger. Pt tolerated dysphagia 3 solids and and thin liquids via straw without overt s/sx of aspiration. Mastication was functionally increased and pt independently cleared oral residue. Speech intelligibility was more reduced than when he was last seen by speech pathology, but it is noteworthy that the last session was early in the morning. 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 Pt's daughter was educated regarding speech vs language, the nature of aphasia, the pt's language deficits, strategies to improve auditory comprehension, facilitate communication with the pt, and to reduce the pt's frustration. She verbalized understanding as well as agreement and was noted to use some of the strategies during the session. Pt produced 5/10 numbers and 3/7 days of the week with phonemic and part-word cues. He demonstrated 40% accuracy with picture identification from a field of two. He achieved 60% accuracy with reading comprehension at the word level increasing to 80% with self-correction. He completed a verbal phrase-completion task with 20% accuracy increasing to 100% with orthographic cues. SLP will continue to follow pt.     HPI HPI: Pt is an 80 y/o male who presented to the ED secondary to right ankle wound. MRI brain 8/2: Multifocal acute ischemia in a watershed distribution in the left hemisphere. CXR 8/2 negative. Per EMR, pt's wife "noted pt having issues with swallowing for several weeks now.  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      SLP Plan  Continue with current plan of care       Recommendations  Diet recommendations: Dysphagia 3 (mechanical soft);Thin liquid Liquids provided via: Cup;Straw Medication Administration: Whole meds with liquid Supervision: Staff to assist with self feeding Compensations: Slow rate;Small sips/bites;Follow solids with liquid Postural Changes and/or Swallow Maneuvers: Seated upright 90 degrees                Oral Care Recommendations: Oral care BID Follow up Recommendations: None SLP Visit Diagnosis: Dysphagia, unspecified (R13.10) Plan: Continue with current plan of care       Scott Mcdowell I. Hardin Negus, [place], [place] Office number [phone] Pager [phone]                Scott Mcdowell 03/26/2021, 4:39 PM

## 2021-03-26 NOTE — Progress Notes (Signed)
Palliative:  HPI: 80 y.o. 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 Workup reveals sepsis r/t MSSA bacteremia presumed from R ankle wound, anemia with Hgb 5.7, new watershed infarct, fecal occult positive. Infectious disease recommends removing PAC, 4 weeks of antibiotics, TEE. SLP consulted and he is tolerating dysphagia 3 diet. Palliative medicine consulted for [place] discussion due to multiple life limiting chronic illnesses.   I met today with Mr. Minjares and no family at bedside. He is alert and he does make attempts to communicate but speech is garbled and difficult to understand. He does deny pain or any discomfort. He seems content to lie in bed and watch television. He also ate ~50% of breakfast (but seemed like quite a bit of food) and 2 apple juices on his tray.   I called and spoke with wife, [name]. [name] is recovering from her own illness and would like to visit her husband tomorrow if she continues to feel well. She recalls her conversation with palliative previously. [name] shares that she has not made any decisions yet. She would like to visit with her husband and confer with some extended family members that work in healthcare before making final decisions. [name] further shares that she does feel her husband has an acceptable quality of life as he seems happy and content as long as his needs are met and he is well cared for and assisted. She admits he would clearly be happier if he could get out of bed and if he could communicate better. Given that she feels he is content with his quality of life they would like full scope treatment. [name] is considering putting in place DNR but needs more time to discuss with family before making  a final decision. Of note, [name] shares that he does have hearing loss that can make communication more difficult.   All questions/concerns addressed. Emotional support provided.   Exam: Alert, seems somewhat oriented. No distress. Breathing regular, unlabored. Abd soft. Warm to touch. Bed bound.   Plan: - Consideration of DNR per family but no decisions yet.  - Full scope desired.   25 min  Vinie Sill, NP Palliative Medicine Team Pager [phone] (Please see [email or web address] for schedule) Team Phone [phone]    Greater than 50%  of this time was spent counseling and coordinating care related to the above assessment and plan

## 2021-03-26 NOTE — Progress Notes (Signed)
IV team consulted earlier as PIV  in right arm may have infiltration vs. Some other edema. IV discontinued, MD aware and orderig PICC line for continued IV antbx.

## 2021-03-27 ENCOUNTER — Inpatient Hospital Stay (HOSPITAL_COMMUNITY): Payer: Medicare Other

## 2021-03-27 DIAGNOSIS — M7989 Other specified soft tissue disorders: Secondary | ICD-10-CM

## 2021-03-27 DIAGNOSIS — M79601 Pain in right arm: Secondary | ICD-10-CM

## 2021-03-27 LAB — CBC
HCT: 27 % — ABNORMAL LOW (ref 39.0–52.0)
Hemoglobin: 8.2 g/dL — ABNORMAL LOW (ref 13.0–17.0)
MCH: 29.7 pg (ref 26.0–34.0)
MCHC: 30.4 g/dL (ref 30.0–36.0)
MCV: 97.8 fL (ref 80.0–100.0)
Platelets: 173 10*3/uL (ref 150–400)
RBC: 2.76 MIL/uL — ABNORMAL LOW (ref 4.22–5.81)
RDW: 17.5 % — ABNORMAL HIGH (ref 11.5–15.5)
WBC: 3.6 10*3/uL — ABNORMAL LOW (ref 4.0–10.5)
nRBC: 0 % (ref 0.0–0.2)

## 2021-03-27 LAB — BASIC METABOLIC PANEL
Anion gap: 6 (ref 5–15)
BUN: 26 mg/dL — ABNORMAL HIGH (ref 8–23)
CO2: 22 mmol/L (ref 22–32)
Calcium: 7.7 mg/dL — ABNORMAL LOW (ref 8.9–10.3)
Chloride: 106 mmol/L (ref 98–111)
Creatinine, Ser: 1.37 mg/dL — ABNORMAL HIGH (ref 0.61–1.24)
GFR, Estimated: 52 mL/min — ABNORMAL LOW (ref >60)
Glucose, Bld: 81 mg/dL (ref 70–99)
Potassium: 4.2 mmol/L (ref 3.5–5.1)
Sodium: 134 mmol/L — ABNORMAL LOW (ref 135–145)

## 2021-03-27 LAB — CULTURE, BLOOD (ROUTINE X 2)
Culture: NO GROWTH
Culture: NO GROWTH
Special Requests: ADEQUATE

## 2021-03-27 NOTE — Progress Notes (Signed)
Diagnosis: Mssa bacteremia Right ankle osteomyelitis Failure to thrive/bedbound Advanced MM  Bcx positive on admission 8/02; negative on 8/04 No echo Palliative care involved, family opting for full care at this time    -poor prognosis overall; strongly consider ongoing goals of care -if full medical care desired, would consider 6 weeks for osteomyelitis/mssa bacteremia treatment with cefazolin -no need for echo in this setting -picc can be placed any time   OPAT Orders Discharge antibiotics to be given via PICC line Discharge antibiotics: cefazolin 2 gram iv q8hours Lab Results  Component Value Date   CREATININE 1.37 (H) 03/27/2021    Duration: 6weeks End Date: 8/04-9/15  [facility] Care Per Protocol:  Home health RN for IV administration and teaching; PICC line care and labs.    Labs weekly while on IV antibiotics: _x_ CBC with differential __ BMP _x_ CMP _x_ CRP __ ESR __ Vancomycin trough __ CK  __ Please pull PIC at completion of IV antibiotics __ Please leave PIC in place until doctor has seen patient or been notified  Fax weekly labs to [phone]  Clinic Follow Up Appt: 8/26 @ 9am with dr Gale Journey  @  [facility] [place], [place], [place] [address] Phone: [phone]

## 2021-03-27 NOTE — Progress Notes (Signed)
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  He has been hospitalized at [facility] multiple times in the last few months.  His right ankle pressure wound has been getting progressively worse with some drainage. In the ED patient was noted to be hypotensive with blood pressures in the 70-80s, hemoglobin was 5.4, creatinine was 2.5.   No history of overt blood loss. 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 -Blood Cx w/ MSSA Bacteremia-secondary to right ankle osteomyelitis, now on Ancef -Palliative following   Assessment & Plan:   Severe Sepsis, poa MSSA bacteremia Right ankle wound, right ankle osteomyelitis, pressure wound-poa -Continue IV Ancef -Repeat blood cultures are negative  -Appreciate infectious disease input, recommending line holiday and port removal,  -Discussed with daughter and patient's spouse, spouse would like treatment for bacteremia, agreeable to port removal, this was completed 8/6 -Continue wound care -Multiple discussions with family suggesting palliative, hospice and comfort focused care as most appropriate in this patient  -Palliative team following, plan for full scope of Rx for now, more discussions regarding code status today -PICC line could not be placed by IV team, will request IR  -ID recommends 6 weeks of IV Ancef   Acute on chronic anemia -Suspect this is multifactorial, has underlying multiple myeloma, chronic kidney disease, no history of melena or hematochezia however he  was heme positive. -Transfused 2 units of PRBC, anemia panel suggestive of chronic disease, no iron deficiency -He is not a candidate for endoscopic evaluation in the setting with acute CVA, bacteremia etc. 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 -Suspect hypotension is multifactorial, likely dehydration, blood loss and infection contributing, blood pressure more stable now -Eliquis on hold in the setting of severe anemia -Appreciate neurology input, recommended palliative care, no value in additional work-up -PT OT, SLP evaluations completed, home therapy recommended, he is total care at baseline  Hypotension -See discussion above, likely multifactorial, secondary to bacteremia, severe anemia, dehydration likely contributing, antibiotics as above -Blood pressure stable now  CKD 4 -Baseline creatinine around 1.5, worse in the setting of severe anemia, hypotension etc. -Now stable, IV fluids discontinued  Dysphagia -SLP evaluation ongoing, continue dysphagia diet  Multiple myeloma -On Revlimid at baseline, this is on hold in the setting of bacteremia/infection  Hypokalemia -Replaced  Severe protein calorie malnutrition -Add supplements as tolerated  Ethics -This is a chronically ill elderly male who is bedbound, right hemiplegia, pressure wounds with significant functional decline over the last 6 months to a year, multiple myeloma, H/o CVA w/ Hemiplegia, CKD, now with acute CVA, bacteremia, severe anemia, protein calorie malnutrition. -Discussed overall poor prognosis with patient's daughter and spouse, recommended DNR and symptom/comfort focused care -Palliative consult appreciated, family requests full  scope of care   DVT prophylaxis: SCDs Code Status: Full code Family Communication: No family at bedside, called  and updated daughter [name] 8/6 and spouse 8/7 Disposition Plan:  Status is: Inpatient  Remains inpatient appropriate because:Inpatient level of care appropriate due to severity of illness  Dispo: The patient is from: Home              Anticipated d/c is to: Home, he has been total care              Patient currently is not medically stable to d/c.   Difficult to place patient No   Consultants:  Neurology Infectious disease  Procedures:   Antimicrobials:    Subjective: -Feels okay mumbles, no events overnight, tolerating dysphagia diet  Objective: Vitals:   03/26/21 2300 03/27/21 0417 03/27/21 0734 03/27/21 1143  BP: 110/86 (!) 131/98 106/62 121/61  Pulse: 94 71 77 78  Resp:  _0 Temp: 98.2 F (36.8 C) 98 F (36.7 C) 98.2 F (36.8 C) 98 F (36.7 C)  TempSrc: Oral Axillary Oral Oral  SpO2: 95% 98% 96% 97%  Weight:        Intake/Output Summary (Last 24 hours) at 03/27/2021 1430 Last data filed at 03/27/2021 0736 Gross per 24 hour  Intake 476 ml  Output 450 ml  Net 26 ml   Filed Weights   03/20/21 1500  Weight: 75.9 kg    Examination:  General exam: Chronically ill elderly male laying in bed, awake alert, dysarthric, mumbles few words, no distress HEENT: Port-A-Cath removed, site with dressing CVS: S1-S2, regular rhythm Lungs: Clear anteriorly Abdomen: Soft, nontender, bowel sounds present Extremities : Right upper and lower extremity are contracted, right ankle full thickness wound with dressing  Neuro : Dysarthric, chronic right-sided deficits follows simple commands Psychiatry: Poor insight and judgment  Data Reviewed:   CBC: Recent Labs  Lab 03/21/21 0800 03/22/21 0020 03/23/21 0155 03/24/21 0043 03/25/21 0631 03/26/21 0225 03/27/21 0220  WBC 4.6   < > 4.4 3.9* 4.1 2.9* 3.6*  NEUTROABS 3.6  --   --   --   --   --   --   HGB  7.8*   < > 8.0* 8.5* 9.0* 8.2* 8.2*  HCT 24.1*   < > 24.4* 26.3* 27.3* 27.2* 27.0*  MCV 92.7   < > 91.0 92.0 92.2 98.6 97.8  PLT 186   < > 151 157 189 165 173   < > = values in this interval not displayed.   Basic Metabolic Panel: Recent Labs  Lab 03/20/21 1603 03/21/21 0800 03/23/21 0155 03/24/21 0043 03/25/21 0631 03/26/21 0225 03/27/21 0220  NA  --    < > 137 137 139 135 134*  K  --    < > 2.8* 2.8* 3.8 4.1 4.2  CL  --    < > 105 106 108 107 106  CO2  --    < > _1 GLUCOSE  --    < > 106* 87 113* 81 81  BUN  --    < > 41* 35* 33* 29* 26*  CREATININE  --    < > 1.94* 1.73* 1.63* 1.39* 1.37*  CALCIUM  --    < > 7.5* 7.6* 7.8* 7.6* 7.7*  MG 2.6*  --   --   --  2.4  --   --   PHOS 4.6  --   --   --   --   --   --    < > = values in this interval not displayed.  GFR: Estimated Creatinine Clearance: 44.6 mL/min (A) (by C-G formula based on SCr of 1.37 mg/dL (H)). Liver Function Tests: Recent Labs  Lab 03/21/21 0800  AST 17  ALT 29  ALKPHOS 46  BILITOT 1.5*  PROT 4.8*  ALBUMIN 1.9*   Recent Labs  Lab 03/20/21 1603  LIPASE 23   No results for input(s): AMMONIA in the last 168 hours. Coagulation Profile: No results for input(s): INR, PROTIME in the last 168 hours.  Cardiac Enzymes: No results for input(s): CKTOTAL, CKMB, CKMBINDEX, TROPONINI in the last 168 hours. BNP (last 3 results) No results for input(s): PROBNP in the last 8760 hours. HbA1C: No results for input(s): HGBA1C in the last 72 hours.  CBG: No results for input(s): GLUCAP in the last 168 hours. Lipid Profile: No results for input(s): CHOL, HDL, LDLCALC, TRIG, CHOLHDL, LDLDIRECT in the last 72 hours.  Thyroid Function Tests: No results for input(s): TSH, T4TOTAL, FREET4, T3FREE, THYROIDAB in the last 72 hours. Anemia Panel: No results for input(s): VITAMINB12, FOLATE, FERRITIN, TIBC, IRON, RETICCTPCT in the last 72 hours.  Urine analysis:    Component Value Date/Time    COLORURINE YELLOW 03/20/2021 1840   APPEARANCEUR HAZY (A) 03/20/2021 1840   LABSPEC 1.013 03/20/2021 1840   PHURINE 5.0 03/20/2021 1840   GLUCOSEU NEGATIVE 03/20/2021 1840   HGBUR NEGATIVE 03/20/2021 1840   BILIRUBINUR NEGATIVE 03/20/2021 1840   KETONESUR NEGATIVE 03/20/2021 1840   PROTEINUR NEGATIVE 03/20/2021 1840   NITRITE NEGATIVE 03/20/2021 1840   LEUKOCYTESUR NEGATIVE 03/20/2021 1840   Sepsis Labs: _0 (procalcitonin:4,lacticidven:4)  ) Recent Results (from the past 240 hour(s))  Resp Panel by RT-PCR (Flu A&B, Covid) Nasopharyngeal Swab     Status: None   Collection Time: 03/20/21 12:27 PM   Specimen: Nasopharyngeal Swab; Nasopharyngeal(NP) swabs in vial transport medium  Result Value Ref Range Status   SARS Coronavirus 2 by RT PCR NEGATIVE NEGATIVE Final    Comment: (NOTE) SARS-CoV-2 target nucleic acids are NOT DETECTED.  The SARS-CoV-2 RNA is generally detectable in upper respiratory specimens during the acute phase of infection. The lowest concentration of SARS-CoV-2 viral copies this assay can detect is 138 copies/mL. A negative result does not preclude SARS-Cov-2 infection and should not be used as the sole basis for treatment or other patient management decisions. A negative result may occur with  improper specimen collection/handling, submission of specimen other than nasopharyngeal swab, presence of viral mutation(s) within the areas targeted by this assay, and inadequate number of viral copies(<138 copies/mL). A negative result must be combined with clinical observations, patient history, and epidemiological information. The expected result is Negative.  Fact Sheet for Patients:  EntrepreneurPulse.com.au  Fact Sheet for Healthcare Providers:  IncredibleEmployment.be  This test is no t yet approved or cleared by the [place] FDA and  has been authorized for detection and/or diagnosis of SARS-CoV-2 by FDA under  an Emergency Use Authorization (EUA). This EUA will remain  in effect (meaning this test can be used) for the duration of the COVID-19 declaration under Section 564(b)(1) of the Act, 21 U.S.C.section 360bbb-3(b)(1), unless the authorization is terminated  or revoked sooner.       Influenza A by PCR NEGATIVE NEGATIVE Final   Influenza B by PCR NEGATIVE NEGATIVE Final    Comment: (NOTE) The Xpert Xpress SARS-CoV-2/FLU/RSV plus assay is intended as an aid in the diagnosis of influenza from Nasopharyngeal swab specimens and should not be used as a sole basis for treatment. Nasal washings and aspirates are unacceptable  for Xpert Xpress SARS-CoV-2/FLU/RSV testing.  Fact Sheet for Patients: EntrepreneurPulse.com.au  Fact Sheet for Healthcare Providers: IncredibleEmployment.be  This test is not yet approved or cleared by the [place] FDA and has been authorized for detection and/or diagnosis of SARS-CoV-2 by FDA under an Emergency Use Authorization (EUA). This EUA will remain in effect (meaning this test can be used) for the duration of the COVID-19 declaration under Section 564(b)(1) of the Act, 21 U.S.C. section 360bbb-3(b)(1), unless the authorization is terminated or revoked.  Performed at [facility], [place] [address] [place], [place] [address]   Blood Culture (routine x 2)     Status: Abnormal   Collection Time: 03/20/21 12:56 PM   Specimen: BLOOD RIGHT FOREARM  Result Value Ref Range Status   Specimen Description BLOOD RIGHT FOREARM  Final   Special Requests   Final    BOTTLES DRAWN AEROBIC AND ANAEROBIC Blood Culture results may not be optimal due to an inadequate volume of blood received in culture bottles   Culture  Setup Time   Final    GRAM POSITIVE COCCI IN CLUSTERS IN BOTH AEROBIC AND ANAEROBIC BOTTLES Organism ID to follow CRITICAL RESULT CALLED TO, READ BACK BY AND VERIFIED WITH: PHARMD ALEX LAWLESS 03/21/21_0  BY  TW Performed at [facility], [place] [address], [place], [place] [address]    Culture STAPHYLOCOCCUS AUREUS (A)  Final   Report Status 03/23/2021 FINAL  Final   Organism ID, Bacteria STAPHYLOCOCCUS AUREUS  Final      Susceptibility   Staphylococcus aureus - MIC*    CIPROFLOXACIN <=0.5 SENSITIVE Sensitive     ERYTHROMYCIN >=8 RESISTANT Resistant     GENTAMICIN <=0.5 SENSITIVE Sensitive     OXACILLIN <=0.25 SENSITIVE Sensitive     TETRACYCLINE >=16 RESISTANT Resistant     VANCOMYCIN <=0.5 SENSITIVE Sensitive     TRIMETH/SULFA <=10 SENSITIVE Sensitive     CLINDAMYCIN >=8 RESISTANT Resistant     RIFAMPIN <=0.5 SENSITIVE Sensitive     Inducible Clindamycin NEGATIVE Sensitive     * STAPHYLOCOCCUS AUREUS  Blood Culture ID Panel (Reflexed)     Status: Abnormal   Collection Time: 03/20/21 12:56 PM  Result Value Ref Range Status   Enterococcus faecalis NOT DETECTED NOT DETECTED Final   Enterococcus Faecium NOT DETECTED NOT DETECTED Final   Listeria monocytogenes NOT DETECTED NOT DETECTED Final   Staphylococcus species DETECTED (A) NOT DETECTED Final    Comment: CRITICAL RESULT CALLED TO, READ BACK BY AND VERIFIED WITH: PHARMD ALEX LAWLESS 03/21/21_1  BY TW    Staphylococcus aureus (BCID) DETECTED (A) NOT DETECTED Final    Comment: CRITICAL RESULT CALLED TO, READ BACK BY AND VERIFIED WITH: PHARMD ALEX LAWLESS 03/21/21_2  BY TW    Staphylococcus epidermidis NOT DETECTED NOT DETECTED Final   Staphylococcus lugdunensis NOT DETECTED NOT DETECTED Final   Streptococcus species NOT DETECTED NOT DETECTED Final   Streptococcus agalactiae NOT DETECTED NOT DETECTED Final   Streptococcus pneumoniae NOT DETECTED NOT DETECTED Final   Streptococcus pyogenes NOT DETECTED NOT DETECTED Final   A.calcoaceticus-baumannii NOT DETECTED NOT DETECTED Final   Bacteroides fragilis NOT DETECTED NOT DETECTED Final   Enterobacterales NOT DETECTED NOT DETECTED Final   Enterobacter cloacae complex NOT DETECTED  NOT DETECTED Final   Escherichia coli NOT DETECTED NOT DETECTED Final   Klebsiella aerogenes NOT DETECTED NOT DETECTED Final   Klebsiella oxytoca NOT DETECTED NOT DETECTED Final   Klebsiella pneumoniae NOT DETECTED NOT DETECTED Final   Proteus species NOT DETECTED  NOT DETECTED Final   Salmonella species NOT DETECTED NOT DETECTED Final   Serratia marcescens NOT DETECTED NOT DETECTED Final   Haemophilus influenzae NOT DETECTED NOT DETECTED Final   Neisseria meningitidis NOT DETECTED NOT DETECTED Final   Pseudomonas aeruginosa NOT DETECTED NOT DETECTED Final   Stenotrophomonas maltophilia NOT DETECTED NOT DETECTED Final   Candida albicans NOT DETECTED NOT DETECTED Final   Candida auris NOT DETECTED NOT DETECTED Final   Candida glabrata NOT DETECTED NOT DETECTED Final   Candida krusei NOT DETECTED NOT DETECTED Final   Candida parapsilosis NOT DETECTED NOT DETECTED Final   Candida tropicalis NOT DETECTED NOT DETECTED Final   Cryptococcus neoformans/gattii NOT DETECTED NOT DETECTED Final   Meth resistant mecA/C and MREJ NOT DETECTED NOT DETECTED Final    Comment: Performed at [facility], [place] [address], [place], [place] [address]  Culture, blood (routine x 2)     Status: None   Collection Time: 03/22/21  8:34 AM   Specimen: BLOOD RIGHT HAND  Result Value Ref Range Status   Specimen Description BLOOD RIGHT HAND  Final   Special Requests   Final    BOTTLES DRAWN AEROBIC AND ANAEROBIC Blood Culture adequate volume   Culture   Final    NO GROWTH 5 DAYS Performed at [facility], [place] [address], [place], [place] [address]    Report Status 03/27/2021 FINAL  Final  Culture, blood (routine x 2)     Status: None   Collection Time: 03/22/21  8:42 AM   Specimen: BLOOD RIGHT HAND  Result Value Ref Range Status   Specimen Description BLOOD RIGHT HAND  Final   Special Requests   Final    BOTTLES DRAWN AEROBIC AND ANAEROBIC Blood Culture results may not be optimal due to an inadequate  volume of blood received in culture bottles   Culture   Final    NO GROWTH 5 DAYS Performed at [facility], [place] [address] [place], [place] [address]    Report Status 03/27/2021 FINAL  Final     Scheduled Meds:  acetaminophen  650 mg Oral BID   Chlorhexidine Gluconate Cloth  6 each Topical Daily   collagenase   Topical Daily   darbepoetin (ARANESP) injection - NON-DIALYSIS  40 mcg Subcutaneous Q Thu-1800   ferrous sulfate  325 mg Oral Q breakfast   metoprolol tartrate  25 mg Oral BID   pantoprazole  40 mg Oral Q1200   senna-docusate  2 tablet Oral BID   thiamine injection  100 mg Intravenous Daily   Or   thiamine  100 mg Oral Daily   vitamin B-12  1,000 mcg Oral Daily   Continuous Infusions:  sodium chloride      ceFAZolin (ANCEF) IV Stopped (03/26/21 0730)     LOS: 7 days   Time spent:  38mn  PDomenic Polite MD [facility]   03/27/2021, 2:30 PM

## 2021-03-27 NOTE — Progress Notes (Signed)
Right upper extremity venous duplex has been completed. Preliminary results can be found in CV Proc through chart review.   03/27/21 10:23 AM Scott Mcdowell RVT

## 2021-03-27 NOTE — Progress Notes (Signed)
PT Cancellation Note  Patient Details Name: Haile Bosler MRN: [id number] DOB: 10/15/1940   Cancelled Treatment:    Reason Eval/Treat Not Completed: Other (comment) RN requests hold as they are imminently putting in PICC line and patient very fatigued from rolling back and forth for doppler earlier today. Will check back tomorrow.   Windell Norfolk, DPT, PN2   Supplemental Physical Therapist [place]    Pager [phone] Acute Rehab Office [phone]

## 2021-03-27 NOTE — Progress Notes (Signed)
   Patient Status: [facility] - In-pt  Assessment and Plan: Patient in need of venous access.   Peripherally inserted central catheter placement  ______________________________________________________________________   History of Present Illness: Scott Mcdowell is a 80 y.o. male   Bacteremia- staph infection 8/2 PAC removed in IR 8/6 (not placed in IR) 8/9: BC no growth in 5 days  Rt ankle osteomyelitis Failure to thrive Advanced Multiple myeloma  Non tunneled HD placed in IR 01/2019- renal insufficiency-- no longer on dialysis per MD----No longer candidate for dialysis Cr 1.37 today (1.94--1.73--1.63-- 1.39-- 1.37)  Hx + dialysis fistula in left arm Hx Rt arm DVT 17 yrs ago  Requesting peripherally inserted central catheter for access for meds/antibiotics  Allergies and medications reviewed.   Review of Systems: A 12 point ROS discussed and pertinent positives are indicated in the HPI above.  All other systems are negative.   Vital Signs: BP 106/62 (BP Location: Left Leg)   Pulse 77   Temp 98.2 F (36.8 C) (Oral)   Resp 16   Wt 167 lb 5.3 oz (75.9 kg)   SpO2 96%   BMI 20.91 kg/m   Physical Exam Cardiovascular:     Rate and Rhythm: Normal rate and regular rhythm.  Pulmonary:     Breath sounds: Normal breath sounds.  Skin:    General: Skin is warm.  Neurological:     Mental Status: Mental status is at baseline.  Psychiatric:     Comments: Pt is mildly confused Spoke to wife [name] via phone She agrees to PICC placement     Imaging reviewed.   Labs:  COAGS: Recent Labs    03/20/21 1238  INR 1.9*  APTT 30    BMP: Recent Labs    03/24/21 0043 03/25/21 0631 03/26/21 0225 03/27/21 0220  NA 137 139 135 134*  K 2.8* 3.8 4.1 4.2  CL 106 108 107 106  CO2 22 23 22 22  GLUCOSE 87 113* 81 81  BUN 35* 33* 29* 26*  CALCIUM 7.6* 7.8* 7.6* 7.7*  CREATININE 1.73* 1.63* 1.39* 1.37*  GFRNONAA 39* 42* 51* 52*    Scheduled for peripherally  inserted central catheter placement Wife [name] has consented to procedure She is aware of procedure benefits and risks- including but limited to Infection; vessel damage   Electronically Signed:  A , PA-C 03/27/2021, 11:12 AM   I spent a total of 15 minutes in face to face in clinical consultation, greater than 50% of which was counseling/coordinating care for venous access.  

## 2021-03-27 NOTE — Progress Notes (Signed)
PHARMACY CONSULT NOTE FOR:  OUTPATIENT  PARENTERAL ANTIBIOTIC THERAPY (OPAT)  Indication: MSSA bacteremia and osteomyelitis Regimen: cefazolin 2g IV q8h End date: 05/03/21  IV antibiotic discharge orders are pended. To discharging provider:  please sign these orders via discharge navigator,  Select New Orders & click on the button choice - Manage This Unsigned Work.     Thank you for allowing pharmacy to be a part of this patient's care.  Scott Mcdowell 03/27/2021, 10:31 AM

## 2021-03-27 NOTE — Progress Notes (Signed)
OT Cancellation Note  Patient Details Name: Scott Mcdowell MRN: [id number] DOB: [birth year]   Cancelled Treatment:    Reason Eval/Treat Not Completed: Patient at procedure or test/ unavailable;Medical issues which prohibited therapy: Pt's RN, Richardson Landry requests for therapy to hold today as pt about to have PICC line procedure and RN does not want pt over-taxed. Noted pt receiving Total Assist with eating applesauce as OT entered room. Will continue efforts.  Julien Girt 03/27/2021, 12:09 PM

## 2021-03-28 ENCOUNTER — Inpatient Hospital Stay (HOSPITAL_COMMUNITY): Payer: Medicare Other

## 2021-03-28 LAB — CBC
HCT: 26 % — ABNORMAL LOW (ref 39.0–52.0)
Hemoglobin: 7.9 g/dL — ABNORMAL LOW (ref 13.0–17.0)
MCH: 29.6 pg (ref 26.0–34.0)
MCHC: 30.4 g/dL (ref 30.0–36.0)
MCV: 97.4 fL (ref 80.0–100.0)
Platelets: 203 10*3/uL (ref 150–400)
RBC: 2.67 MIL/uL — ABNORMAL LOW (ref 4.22–5.81)
RDW: 17.2 % — ABNORMAL HIGH (ref 11.5–15.5)
WBC: 4 10*3/uL (ref 4.0–10.5)
nRBC: 0 % (ref 0.0–0.2)

## 2021-03-28 LAB — BASIC METABOLIC PANEL
Anion gap: 5 (ref 5–15)
BUN: 26 mg/dL — ABNORMAL HIGH (ref 8–23)
CO2: 25 mmol/L (ref 22–32)
Calcium: 7.9 mg/dL — ABNORMAL LOW (ref 8.9–10.3)
Chloride: 105 mmol/L (ref 98–111)
Creatinine, Ser: 1.44 mg/dL — ABNORMAL HIGH (ref 0.61–1.24)
GFR, Estimated: 49 mL/min — ABNORMAL LOW (ref >60)
Glucose, Bld: 77 mg/dL (ref 70–99)
Potassium: 4.6 mmol/L (ref 3.5–5.1)
Sodium: 135 mmol/L (ref 135–145)

## 2021-03-28 MED ORDER — LIDOCAINE HCL (PF) 1 % IJ SOLN
INTRAMUSCULAR | Status: DC | PRN
  Administered 2021-03-28 – 2021-04-01 (×2): 5 mL

## 2021-03-28 MED ORDER — LIDOCAINE HCL 1 % IJ SOLN
INTRAMUSCULAR | Status: AC
  Filled 2021-03-28: qty 20

## 2021-03-28 NOTE — Progress Notes (Signed)
Performed gentle PROM of R UE and positioned in elevation on pillows. Positioned R LE in neutral with Prevalon boot and pillows. Pt refusing to eat, but demonstrated ability to drink using L hand. Pt with minimal interaction today compared to evaluation. No family at bedside.    03/28/21 1100  OT Visit Information  Last OT Received On 03/28/21  Assistance Needed +2  History of Present Illness Pt is an 80 year old man admitted on 03/20/21 with R ankle wound and MSSA bacteremia. 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  Precautions  Precautions Fall  Pain Assessment  Pain Assessment Faces  Faces Pain Scale 6  Pain Location RUE/LE with mobility  Pain Descriptors / Indicators Grimacing;Guarding;Moaning  Pain Intervention(s) Monitored during session;Repositioned  Cognition  Arousal/Alertness Awake/alert  Behavior During Therapy Flat affect  Overall Cognitive Status Difficult to assess  General Comments nods head yes/no appropriately. Garbled speech.  Difficult to assess due to Impaired communication  Upper Extremity Assessment  RUE Deficits / Details edematous and red, performed PROM to tolerance and elevated on 3 pillows, skin is very fragile  ADL  Overall ADL's  Needs assistance/impaired  Eating/Feeding Set up;Bed level  Eating/Feeding Details (indicate cue type and reason) brought cup to mouth, refused anything to eat  General ADL Comments Positioned R LE in neutral with prevalon kick stand and pillow  Restrictions  Other Position/Activity Restrictions RN to order a second prevalon boot, only one in room  OT - End of Session  Activity Tolerance Patient tolerated treatment well  Patient left in bed;with call bell/phone within reach;with bed alarm set  Nurse Communication Other (comment) (needs second prevalon boot)  OT Assessment/Plan  OT Plan Discharge plan  remains appropriate  OT Visit Diagnosis Hemiplegia and hemiparesis;Muscle weakness (generalized) (M62.81)  Hemiplegia - Right/Left Right  Hemiplegia - caused by Cerebral infarction  OT Frequency (ACUTE ONLY) Min 2X/week  Follow Up Recommendations No OT follow up  OT Equipment None recommended by OT  AM-PAC OT "6 Clicks" Daily Activity Outcome Measure (Version 2)  Help from another person eating meals? 2  Help from another person taking care of personal grooming? 2  Help from another person toileting, which includes using toliet, bedpan, or urinal? 1  Help from another person bathing (including washing, rinsing, drying)? 1  Help from another person to put on and taking off regular upper body clothing? 1  Help from another person to put on and taking off regular lower body clothing? 1  6 Click Score 8  Progressive Mobility  What is the highest level of mobility based on the progressive mobility assessment? Level 1 (Bedfast) - Unable to balance while sitting on edge of bed  Mobility Sit up in bed/chair position for meals  OT Goal Progression  Progress towards OT goals Not progressing toward goals - comment  Acute Rehab OT Goals  Patient Stated Goal maintain quality of life  OT Goal Formulation With patient/family  Time For Goal Achievement 04/05/21  Potential to Achieve Goals Fair  OT Time Calculation  OT Start Time (ACUTE ONLY) 0916  OT Stop Time (ACUTE ONLY) 0936  OT Time Calculation (min) 20 min  OT General Charges  $OT Visit 1 Visit  OT Treatments  $Therapeutic Activity 8-22 mins  Nestor Lewandowsky, OTR/L Acute Rehabilitation Services Pager: [phone] Office: [phone]

## 2021-03-28 NOTE — Progress Notes (Signed)
Physical Therapy Treatment Patient Details Name: Scott Mcdowell MRN: [id number] DOB: 04/07/1941 Today's Date: 03/28/2021    History of Present Illness Pt is an 80 year old man admitted on 03/20/21 with R ankle wound and MSSA bacteremia. 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    PT Comments    Bed placed in chair position. Significant edema noted RUE. Attempted PROM RUE/LE. Poor tolerance due to pain. Pt moaning and resisting. Able to perform A/AAROM LLE. RUE/LE elevated on pillows. Prevalon boot in place RLE. HOB at 45 degrees at end of session.    Follow Up Recommendations  Home health PT;Supervision/Assistance - 24 hour     Equipment Recommendations  None recommended by PT    Recommendations for Other Services       Precautions / Restrictions Precautions Precautions: Fall Restrictions Other Position/Activity Restrictions: prevalon boots    Mobility  Bed Mobility Overal bed mobility: Needs Assistance             General bed mobility comments: bedbound at baseline    Transfers                 General transfer comment: Did not attempt. Pt with use of hoyer lift at home >2 months  Ambulation/Gait             General Gait Details: nonambulatory at baseline   Stairs             Wheelchair Mobility    Modified Rankin (Stroke Patients Only)       Balance       Sitting balance - Comments: Bed placed in chair position. Pt reliant on full bed support on trunk.                                    Cognition Arousal/Alertness: Awake/alert Behavior During Therapy: Flat affect Overall Cognitive Status: Difficult to assess                                 General Comments: nods head yes/no appropriately. Garbled speech.      Exercises General Exercises - Lower  Extremity Ankle Circles/Pumps: AROM;AAROM;Left;5 reps Short Arc Quad: AAROM;Left;5 reps Heel Slides: AAROM;Left;5 reps Hip ABduction/ADduction: AAROM;Left;5 reps Other Exercises Other Exercises: PROM RLE, poor tolerance due to pain, pt resisting movement    General Comments        Pertinent Vitals/Pain Pain Assessment: Faces Faces Pain Scale: Hurts whole lot Pain Location: RUE/LE with mobility Pain Descriptors / Indicators: Grimacing;Guarding;Moaning Pain Intervention(s): Limited activity within patient's tolerance;Repositioned    Home Living                      Prior Function            PT Goals (current goals can now be found in the care plan section) Progress towards PT goals: Not progressing toward goals - comment (increased pain RUE/LE)    Frequency    Min 2X/week      PT Plan Current plan remains appropriate    Co-evaluation              AM-PAC PT "6 Clicks" Mobility   Outcome Measure  Help needed turning from your back   to your side while in a flat bed without using bedrails?: Total Help needed moving from lying on your back to sitting on the side of a flat bed without using bedrails?: Total Help needed moving to and from a bed to a chair (including a wheelchair)?: Total Help needed standing up from a chair using your arms (e.g., wheelchair or bedside chair)?: Total Help needed to walk in hospital room?: Total Help needed climbing 3-5 steps with a railing? : Total 6 Click Score: 6    End of Session   Activity Tolerance: Patient limited by pain Patient left: in bed;with call bell/phone within reach;with bed alarm set Nurse Communication: Other (comment) (pain RUE/LE, edema RUE) PT Visit Diagnosis: Other abnormalities of gait and mobility (R26.89);Muscle weakness (generalized) (M62.81);Hemiplegia and hemiparesis Hemiplegia - Right/Left: Right Hemiplegia - dominant/non-dominant: Dominant Hemiplegia - caused by: Cerebral infarction      Time: 0824-0836 PT Time Calculation (min) (ACUTE ONLY): 12 min  Charges:  $Therapeutic Exercise: 8-22 mins                      , PT  Office # [phone] Pager #[phone]    ,  Rene 03/28/2021, 9:33 AM 

## 2021-03-28 NOTE — Progress Notes (Signed)
PROGRESS NOTE    Scott Mcdowell [facility]  WIO:[id number] DOB: 1940-10-10 DOA: 03/20/2021 PCP: Scott Sanes, MD    No chief complaint on file.   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  He has been hospitalized at [facility] multiple times in the last few months.  His right ankle pressure wound has been getting progressively worse with some drainage. In the ED patient was noted to be hypotensive with blood pressures in the 70-80s, hemoglobin was 5.4, creatinine was 2.5.   No history of overt blood loss. 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 -Blood Cx w/ MSSA Bacteremia-secondary to right ankle osteomyelitis, now on Ancef -Palliative following  Assessment & Plan:   Principal Problem:   Acute on chronic anemia Active Problems:   ARF (acute renal failure) (HCC)   Decubitus ulcer of right ankle, stage 4 (HCC) - present on admission   CKD (chronic kidney disease), stage IV (HCC)   GIB (gastrointestinal bleeding)   Multiple myeloma not having achieved remission (HCC)   Anemia in chronic kidney disease   Hypotension due to hypovolemia   Dysphagia   Expressive aphasia   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 (HCC)   SIRS (systemic inflammatory response syndrome) (HCC)   Staphylococcus aureus bacteremia  Severe Sepsis MSSA bacteremia Right ankle wound, right ankle osteomyelitis, pressure wound -Continue IV Ancef - MSSA + 8/2 - repeat cultures 8/4 negative -Appreciate infectious disease input, recommended line holiday and port removal - Port removed 8/6 by IR -Continue wound  care -Multiple discussions with family suggesting palliative, hospice and comfort focused care as most appropriate in this patient by previous MD -Palliative team following, continue full scope  -IR placed ICC 8/10 -ID recommends 6 weeks of IV Ancef.  No recommendation for echo in this setting (echo previously done on 8/3 - EF 45-50%, RWMA, see report).    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  1-39% R ICA stenosis.   -Suspect hypotension is multifactorial, likely dehydration, blood loss and infection contributing, blood pressure more stable now -Eliquis on hold in the setting of severe anemia - follow for consideration of resumption -Appreciate neurology input -> noted L hemispheric watershed infarct in setting of hypoperfusion from MSSA bacteremia with underlying LICA chronic occlussion - recommended palliative care, no recommendation for further aggressive neurovascular workup - neurology recommended maintaining SBP >140 -PT OT, SLP evaluations completed, home therapy recommended, he is total care at baseline  Acute on chronic anemia -Suspect this is multifactorial, has underlying multiple myeloma, chronic kidney disease, no history of melena or hematochezia however he was heme positive. -Transfused 2 units of PRBC, anemia panel suggestive of chronic disease, no iron deficiency -He is not Scott Mcdowell candidate for endoscopic evaluation  in the setting with acute CVA, bacteremia  etc. -Hemoglobin is stable now, no evidence of active bleeding, -Changed PPI to p.o   Hypotension -See discussion above, likely multifactorial, secondary to bacteremia, severe anemia, dehydration likely contributing, antibiotics as above -Blood pressure stable now - neurology recommending maintaining SBP >140 due to risk of stroke in setting of hypoperfusion   Mildly Reduced EF  Regional Wall Motion Abnormalities Appears new, continue to follow Follow up pending additional goc discussions  CKD 4 -Baseline creatinine around 1.5, worse in the setting of severe anemia, hypotension etc. -Now stable, IV fluids discontinued   Dysphagia -SLP evaluation ongoing, continue dysphagia diet   Multiple myeloma -On Revlimid at baseline, this is on hold in the setting of bacteremia/infection   Hypokalemia -Replaced   Severe protein calorie malnutrition -Add supplements as tolerated   Ethics -This is Scott Mcdowell chronically ill elderly male who is bedbound, right hemiplegia, pressure wounds with significant functional decline over the last 6 months to Scott Mcdowell year, multiple myeloma, H/o CVA w/ Hemiplegia, CKD, now with acute CVA, bacteremia, severe anemia, protein calorie malnutrition. -Prior hospitalist discussed overall poor prognosis with patient's daughter and spouse, recommended DNR and symptom/comfort focused care -Palliative consult appreciated, family requests full scope of care  DVT prophylaxis: SCD Code Status: full  Family Communication: none at bedside Disposition:   Status is: Inpatient  Remains inpatient appropriate because:Inpatient level of care appropriate due to severity of illness  Dispo: The patient is from: Home              Anticipated d/c is to: Home              Patient currently is not medically stable to d/c.   Difficult to place patient No       Consultants:  neurology  Procedures:   Summary:     Right:  Unable to assess Scott Mcdowell large portion of the venous system due  to the patient's  positioning, and pain tolerance. Visualized portions appeared patent and  compressible.     Left:  No evidence of thrombosis in the subclavian.     Echo IMPRESSIONS     1. Left ventricular ejection fraction, by estimation, is 45 to 50%. The  left ventricle has mildly decreased function. The left ventricle  demonstrates regional wall motion abnormalities (see scoring  diagram/findings for description). There is mild left  ventricular hypertrophy. Left ventricular diastolic parameters are  indeterminate.   2. Right ventricular systolic function is normal. The right ventricular  size is normal.   3. Left atrial size was mildly dilated.   4. The mitral valve is normal in structure. Moderate mitral valve  regurgitation. No evidence of mitral stenosis.   5. Tricuspid valve regurgitation is moderate.   6. The aortic valve is tricuspid. There is moderate calcification of the  aortic valve. There is moderate thickening of the aortic valve. Aortic  valve regurgitation is not visualized. Mild to moderate aortic valve  sclerosis/calcification is present,  without any evidence of aortic stenosis.   7. The inferior vena cava is normal in size with greater than 50%  respiratory variability, suggesting right atrial pressure of 3 mmHg.   Conclusion(s)/Recommendation(s): No intracardiac source of embolism  detected on this transthoracic study. Murrel Freet transesophageal echocardiogram is  recommended to exclude cardiac source of embolism if clinically indicated.   Carotid US Summary:  Right Carotid: Velocities in the right ICA are consistent with Chandlor Noecker 1-39%  stenosis.  Non-hemodynamically significant plaque <50% noted in the  CCA. The                 ECA appears <50% stenosed.   Left Carotid: Velocities in the left ICA are consistent with Mathhew Buysse 80-99%  stenosis.                Non-hemodynamically significant plaque <50% noted in the  CCA. The                ECA appears  <50% stenosed.   Vertebrals:  Right vertebral artery demonstrates antegrade flow. Left  vertebral               artery demonstrates high resistant flow with loss of  diastolic               flow.  Subclavians: Normal flow hemodynamics were seen in the right subclavian  artery.               Abnormal flow seen in left subclavian artery (LUE AVF).  Antimicrobials: Anti-infectives (From admission, onward)    Start     Dose/Rate Route Frequency Ordered Stop   03/24/21 2200  ceFAZolin (ANCEF) IVPB 2g/100 mL premix        2 g 200 mL/hr over 30 Minutes Intravenous Every 8 hours 03/24/21 1522     03/22/21 0000  metroNIDAZOLE (FLAGYL) IVPB 500 mg  Status:  Discontinued        500 mg 100 mL/hr over 60 Minutes Intravenous Every 12 hours 03/21/21 1214 03/25/21 1228   03/21/21 2200  ceFAZolin (ANCEF) IVPB 2g/100 mL premix  Status:  Discontinued        2 g 200 mL/hr over 30 Minutes Intravenous Every 12 hours 03/21/21 1214 03/24/21 1522   03/21/21 0230  piperacillin-tazobactam (ZOSYN) IVPB 3.375 g  Status:  Discontinued        3.375 g 12.5 mL/hr over 240 Minutes Intravenous Every 8 hours 03/20/21 2106 03/21/21 1214   03/20/21 2200  piperacillin-tazobactam (ZOSYN) IVPB 3.375 g  Status:  Discontinued        3.375 g 100 mL/hr over 30 Minutes Intravenous Every 8 hours 03/20/21 2104 03/20/21 2106   03/20/21 2100  vancomycin variable dose per unstable renal function (pharmacist dosing)  Status:  Discontinued         Does not apply See admin instructions 03/20/21 2100 03/21/21 1214   03/20/21 1530  piperacillin-tazobactam (ZOSYN) IVPB 3.375 g        3.375 g 100 mL/hr over 30 Minutes Intravenous  Once 03/20/21 1515 03/20/21 1922   03/20/21 1530  vancomycin (VANCOREADY) IVPB 1500 mg/300 mL        1,500 mg 150 mL/hr over 120 Minutes Intravenous  Once 03/20/21 1515 03/20/21 1835          Subjective: Aphasic, difficult to communicate with   Objective: Vitals:   03/28/21 0400 03/28/21 0745  03/28/21 0910 03/28/21 1600  BP: (!) 110/93 90/66 106/70 102/87  Pulse: 78 85 89 60  Resp: '18 16  18  ' Temp: 97.7 F (36.5 C) 98.3 F (36.8 C)  98.3 F (36.8 C)  TempSrc: Oral Axillary  Oral  SpO2: 92% 95%  100%  Weight:        Intake/Output Summary (Last 24 hours) at 03/28/2021 2009 Last data filed at 03/28/2021 0914 Gross per 24 hour  Intake 200 ml  Output 750 ml  Net -550 ml   Filed Weights   03/20/21 1500  Weight:  75.9 kg    Examination:  General exam: Appears calm and comfortable  Respiratory system: unlabored Cardiovascular system: RRR Gastrointestinal system: Abdomen is nondistended, soft and nontender. Central nervous system: R sided weakness, aphasia Extremities: RLE with prafo boot, dressing  Data Reviewed: I have personally reviewed following labs and imaging studies  CBC: Recent Labs  Lab 03/24/21 0043 03/25/21 0631 03/26/21 0225 03/27/21 0220 03/28/21 0220  WBC 3.9* 4.1 2.9* 3.6* 4.0  HGB 8.5* 9.0* 8.2* 8.2* 7.9*  HCT 26.3* 27.3* 27.2* 27.0* 26.0*  MCV 92.0 92.2 98.6 97.8 97.4  PLT 157 189 165 173 861    Basic Metabolic Panel: Recent Labs  Lab 03/24/21 0043 03/25/21 0631 03/26/21 0225 03/27/21 0220 03/28/21 0220  NA 137 139 135 134* 135  K 2.8* 3.8 4.1 4.2 4.6  CL 106 108 107 106 105  CO2 '22 23 22 22 25  ' GLUCOSE 87 113* 81 81 77  BUN 35* 33* 29* 26* 26*  CREATININE 1.73* 1.63* 1.39* 1.37* 1.44*  CALCIUM 7.6* 7.8* 7.6* 7.7* 7.9*  MG  --  2.4  --   --   --     GFR: Estimated Creatinine Clearance: 42.5 mL/min (Lindell Tussey) (by C-G formula based on SCr of 1.44 mg/dL (H)).  Liver Function Tests: No results for input(s): AST, ALT, ALKPHOS, BILITOT, PROT, ALBUMIN in the last 168 hours.  CBG: No results for input(s): GLUCAP in the last 168 hours.   Recent Results (from the past 240 hour(s))  Resp Panel by RT-PCR (Flu Marg Macmaster&B, Covid) Nasopharyngeal Swab     Status: None   Collection Time: 03/20/21 12:27 PM   Specimen: Nasopharyngeal Swab;  Nasopharyngeal(NP) swabs in vial transport medium  Result Value Ref Range Status   SARS Coronavirus 2 by RT PCR NEGATIVE NEGATIVE Final    Comment: (NOTE) SARS-CoV-2 target nucleic acids are NOT DETECTED.  The SARS-CoV-2 RNA is generally detectable in upper respiratory specimens during the acute phase of infection. The lowest concentration of SARS-CoV-2 viral copies this assay can detect is 138 copies/mL. Hyun Marsalis negative result does not preclude SARS-Cov-2 infection and should not be used as the sole basis for treatment or other patient management decisions. Ginelle Bays negative result may occur with  improper specimen collection/handling, submission of specimen other than nasopharyngeal swab, presence of viral mutation(s) within the areas targeted by this assay, and inadequate number of viral copies(<138 copies/mL). Ludia Gartland negative result must be combined with clinical observations, patient history, and epidemiological information. The expected result is Negative.  Fact Sheet for Patients:  EntrepreneurPulse.com.au  Fact Sheet for Healthcare Providers:  IncredibleEmployment.be  This test is no t yet approved or cleared by the [place] FDA and  has been authorized for detection and/or diagnosis of SARS-CoV-2 by FDA under an Emergency Use Authorization (EUA). This EUA will remain  in effect (meaning this test can be used) for the duration of the COVID-19 declaration under Section 564(b)(1) of the Act, 21 U.S.C.section 360bbb-3(b)(1), unless the authorization is terminated  or revoked sooner.       Influenza Dashea Mcmullan by PCR NEGATIVE NEGATIVE Final   Influenza B by PCR NEGATIVE NEGATIVE Final    Comment: (NOTE) The Xpert Xpress SARS-CoV-2/FLU/RSV plus assay is intended as an aid in the diagnosis of influenza from Nasopharyngeal swab specimens and should not be used as Shakina Choy sole basis for treatment. Nasal washings and aspirates are unacceptable for Xpert Xpress  SARS-CoV-2/FLU/RSV testing.  Fact Sheet for Patients: EntrepreneurPulse.com.au  Fact Sheet for Healthcare Providers: IncredibleEmployment.be  This  test is not yet approved or cleared by the [place] and has been authorized for detection and/or diagnosis of SARS-CoV-2 by FDA under an Emergency Use Authorization (EUA). This EUA will remain in effect (meaning this test can be used) for the duration of the COVID-19 declaration under Section 564(b)(1) of the Act, 21 U.S.C. section 360bbb-3(b)(1), unless the authorization is terminated or revoked.  Performed at [facility], [place] [address], [place], [place] [address]   Blood Culture (routine x 2)     Status: Abnormal   Collection Time: 03/20/21 12:56 PM   Specimen: BLOOD RIGHT FOREARM  Result Value Ref Range Status   Specimen Description BLOOD RIGHT FOREARM  Final   Special Requests   Final    BOTTLES DRAWN AEROBIC AND ANAEROBIC Blood Culture results may not be optimal due to an inadequate volume of blood received in culture bottles   Culture  Setup Time   Final    GRAM POSITIVE COCCI IN CLUSTERS IN BOTH AEROBIC AND ANAEROBIC BOTTLES Organism ID to follow CRITICAL RESULT CALLED TO, READ BACK BY AND VERIFIED WITH: PHARMD ALEX LAWLESS 03/21/21'@1143'  BY TW Performed at [facility], [place] [address], [place], [place] [address]    Culture STAPHYLOCOCCUS AUREUS (Pansey Pinheiro)  Final   Report Status 03/23/2021 FINAL  Final   Organism ID, Bacteria STAPHYLOCOCCUS AUREUS  Final      Susceptibility   Staphylococcus aureus - MIC*    CIPROFLOXACIN <=0.5 SENSITIVE Sensitive     ERYTHROMYCIN >=8 RESISTANT Resistant     GENTAMICIN <=0.5 SENSITIVE Sensitive     OXACILLIN <=0.25 SENSITIVE Sensitive     TETRACYCLINE >=16 RESISTANT Resistant     VANCOMYCIN <=0.5 SENSITIVE Sensitive     TRIMETH/SULFA <=10 SENSITIVE Sensitive     CLINDAMYCIN >=8 RESISTANT Resistant     RIFAMPIN <=0.5 SENSITIVE Sensitive      Inducible Clindamycin NEGATIVE Sensitive     * STAPHYLOCOCCUS AUREUS  Blood Culture ID Panel (Reflexed)     Status: Abnormal   Collection Time: 03/20/21 12:56 PM  Result Value Ref Range Status   Enterococcus faecalis NOT DETECTED NOT DETECTED Final   Enterococcus Faecium NOT DETECTED NOT DETECTED Final   Listeria monocytogenes NOT DETECTED NOT DETECTED Final   Staphylococcus species DETECTED (Gilad Dugger) NOT DETECTED Final    Comment: CRITICAL RESULT CALLED TO, READ BACK BY AND VERIFIED WITH: PHARMD ALEX LAWLESS 03/21/21'@1143'  BY TW    Staphylococcus aureus (BCID) DETECTED (Sadeel Fiddler) NOT DETECTED Final    Comment: CRITICAL RESULT CALLED TO, READ BACK BY AND VERIFIED WITH: PHARMD ALEX LAWLESS 03/21/21'@11423'  BY TW    Staphylococcus epidermidis NOT DETECTED NOT DETECTED Final   Staphylococcus lugdunensis NOT DETECTED NOT DETECTED Final   Streptococcus species NOT DETECTED NOT DETECTED Final   Streptococcus agalactiae NOT DETECTED NOT DETECTED Final   Streptococcus pneumoniae NOT DETECTED NOT DETECTED Final   Streptococcus pyogenes NOT DETECTED NOT DETECTED Final   Kassandra Meriweather.calcoaceticus-baumannii NOT DETECTED NOT DETECTED Final   Bacteroides fragilis NOT DETECTED NOT DETECTED Final   Enterobacterales NOT DETECTED NOT DETECTED Final   Enterobacter cloacae complex NOT DETECTED NOT DETECTED Final   Escherichia coli NOT DETECTED NOT DETECTED Final   Klebsiella aerogenes NOT DETECTED NOT DETECTED Final   Klebsiella oxytoca NOT DETECTED NOT DETECTED Final   Klebsiella pneumoniae NOT DETECTED NOT DETECTED Final   Proteus species NOT DETECTED NOT DETECTED Final   Salmonella species NOT DETECTED NOT DETECTED Final   Serratia marcescens NOT DETECTED NOT DETECTED Final  Haemophilus influenzae NOT DETECTED NOT DETECTED Final   Neisseria meningitidis NOT DETECTED NOT DETECTED Final   Pseudomonas aeruginosa NOT DETECTED NOT DETECTED Final   Stenotrophomonas maltophilia NOT DETECTED NOT DETECTED Final   Candida albicans  NOT DETECTED NOT DETECTED Final   Candida auris NOT DETECTED NOT DETECTED Final   Candida glabrata NOT DETECTED NOT DETECTED Final   Candida krusei NOT DETECTED NOT DETECTED Final   Candida parapsilosis NOT DETECTED NOT DETECTED Final   Candida tropicalis NOT DETECTED NOT DETECTED Final   Cryptococcus neoformans/gattii NOT DETECTED NOT DETECTED Final   Meth resistant mecA/C and MREJ NOT DETECTED NOT DETECTED Final    Comment: Performed at [facility], [place] [address], [place], [place] [address]  Culture, blood (routine x 2)     Status: None   Collection Time: 03/22/21  8:34 AM   Specimen: BLOOD RIGHT HAND  Result Value Ref Range Status   Specimen Description BLOOD RIGHT HAND  Final   Special Requests   Final    BOTTLES DRAWN AEROBIC AND ANAEROBIC Blood Culture adequate volume   Culture   Final    NO GROWTH 5 DAYS Performed at [facility], [place] [address] [place], [place] [address]    Report Status 03/27/2021 FINAL  Final  Culture, blood (routine x 2)     Status: None   Collection Time: 03/22/21  8:42 AM   Specimen: BLOOD RIGHT HAND  Result Value Ref Range Status   Specimen Description BLOOD RIGHT HAND  Final   Special Requests   Final    BOTTLES DRAWN AEROBIC AND ANAEROBIC Blood Culture results may not be optimal due to an inadequate volume of blood received in culture bottles   Culture   Final    NO GROWTH 5 DAYS Performed at [facility], [place] [address], [place], [place] [address]    Report Status 03/27/2021 FINAL  Final         Radiology Studies: VAS [place] UPPER EXTREMITY VENOUS DUPLEX  Result Date: 03/27/2021 UPPER VENOUS STUDY  Patient Name:  Scott Mcdowell  Date of Exam:   03/27/2021 Medical Rec #: [id number]                    Accession #:    [id number] Date of Birth: January 18, 1941                    Patient Gender: M Patient Age:   35 years Exam Location:  [facility] Procedure:      VAS [place] UPPER EXTREMITY VENOUS DUPLEX Referring Phys:  Domenic Polite --------------------------------------------------------------------------------  Indications: Swelling, and Pain Risk Factors: None identified. Limitations: Poor ultrasound/tissue interface, bandages, line and patient positioning, patient immobility, patient pain tolerance. Comparison Study: No prior studies. Performing Technologist: Oliver Hum RVT  Examination Guidelines: Eboni Coval complete evaluation includes B-mode imaging, spectral Doppler, color Doppler, and power Doppler as needed of all accessible portions of each vessel. Bilateral testing is considered an integral part of Mc Hollen complete examination. Limited examinations for reoccurring indications may be performed as noted.  Right Findings: +----------+------------+---------+-----------+----------+--------------+ RIGHT     CompressiblePhasicitySpontaneousProperties   Summary     +----------+------------+---------+-----------+----------+--------------+ IJV           Full       Yes       Yes                             +----------+------------+---------+-----------+----------+--------------+  Subclavian    Full       Yes       Yes                             +----------+------------+---------+-----------+----------+--------------+ Axillary      Full       Yes       Yes                             +----------+------------+---------+-----------+----------+--------------+ Brachial                                            Not visualized +----------+------------+---------+-----------+----------+--------------+ Radial                                              Not visualized +----------+------------+---------+-----------+----------+--------------+ Ulnar                                               Not visualized +----------+------------+---------+-----------+----------+--------------+ Cephalic      Full                                                  +----------+------------+---------+-----------+----------+--------------+ Basilic                                             Not visualized +----------+------------+---------+-----------+----------+--------------+  Left Findings: +----------+------------+---------+-----------+----------+-------+ LEFT      CompressiblePhasicitySpontaneousPropertiesSummary +----------+------------+---------+-----------+----------+-------+ Subclavian    Full       Yes       Yes                      +----------+------------+---------+-----------+----------+-------+  Summary:  Right: Unable to assess Archit Leger large portion of the venous system due to the patient's positioning, and pain tolerance. Visualized portions appeared patent and compressible.  Left: No evidence of thrombosis in the subclavian.  *See table(s) above for measurements and observations.  Diagnosing physician: [name] MD Electronically signed by [name] MD on 03/27/2021 at 5:30:08 PM.    Final    IR PICC PLACEMENT LEFT >5 YRS INC IMG GUIDE  Result Date: 03/28/2021 INDICATION: Right ankle osteomyelitis, in need of long-term IV antibiotics. Request for PICC line placement. EXAM: ULTRASOUND AND FLUOROSCOPIC GUIDED PICC LINE INSERTION MEDICATIONS: 1% lidocaine CONTRAST:  None FLUOROSCOPY TIME:  18 seconds (1 mGy) COMPLICATIONS: None immediate. TECHNIQUE: The procedure, risks, benefits, and alternatives were explained to the patient and informed written consent was obtained. The right upper extremity was prepped with chlorhexidine in Makaiah Terwilliger sterile fashion, and Tayjon Halladay sterile drape was applied covering the operative field. Maximum barrier sterile technique with sterile gowns and gloves were used for the procedure. Ashely Goosby timeout was performed prior to the initiation of the procedure. Local anesthesia was provided with 1% lidocaine. After the overlying soft tissues  were anesthetized with 1% lidocaine, Manvir Prabhu micropuncture kit was utilized to access the right  brachial vein. Real-time ultrasound guidance was utilized for vascular access including the acquisition of Ashdon Gillson permanent ultrasound image documenting patency of the accessed vessel. Elle Vezina guidewire was advanced to the level of the superior caval-atrial junction for measurement purposes and the PICC line was cut to length. Disney Ruggiero peel-away sheath was placed and Miel Wisener 37 cm, 5 [place], dual lumen was inserted to level of the superior caval-atrial junction. Ardelia Wrede post procedure spot fluoroscopic was obtained. The catheter easily aspirated and flushed and was secured in place. Yong Grieser dressing was placed. The patient tolerated the procedure well without immediate post procedural complication. FINDINGS: After catheter placement, the tip lies within the superior cavoatrial junction. The catheter aspirates and flushes normally and is ready for immediate use. IMPRESSION: Successful ultrasound and fluoroscopic guided placement of Daijanae Rafalski right brachial vein approach, 37 cm, 5 French, dual lumen PICC with tip at the superior caval-atrial junction. The PICC line is ready for immediate use. Read by: Durenda Guthrie, PA-C Electronically Signed   By: Markus Daft M.D.   On: 03/28/2021 12:02        Scheduled Meds:  acetaminophen  650 mg Oral BID   Chlorhexidine Gluconate Cloth  6 each Topical Daily   collagenase   Topical Daily   darbepoetin (ARANESP) injection - NON-DIALYSIS  40 mcg Subcutaneous Q Thu-1800   ferrous sulfate  325 mg Oral Q breakfast   lidocaine       metoprolol tartrate  25 mg Oral BID   pantoprazole  40 mg Oral Q1200   senna-docusate  2 tablet Oral BID   thiamine injection  100 mg Intravenous Daily   Or   thiamine  100 mg Oral Daily   vitamin B-12  1,000 mcg Oral Daily   Continuous Infusions:  sodium chloride      ceFAZolin (ANCEF) IV 2 g (03/28/21 1400)     LOS: 8 days    Time spent: over 30 min    Fayrene Helper, MD [facility]   To contact the attending provider between 7A-7P or the covering provider  during after hours 7P-7A, please log into the web site [email or web address] and access using universal [place] password for that web site. If you do not have the password, please call the hospital operator.  03/28/2021, 8:09 PM

## 2021-03-28 NOTE — Procedures (Signed)
PROCEDURE SUMMARY:  Successful placement of double lumen PICC line to right brachial vein. Length 37 cm Tip at lower SVC/RA PICC capped No complications Ready for use  EBL < 5 mL   Taiwan Talcott H Odette Watanabe PA-C 03/28/2021, 12:00 PM

## 2021-03-29 ENCOUNTER — Inpatient Hospital Stay (HOSPITAL_COMMUNITY): Payer: Medicare Other

## 2021-03-29 ENCOUNTER — Ambulatory Visit: Payer: Medicare Other | Admitting: Oncology

## 2021-03-29 ENCOUNTER — Inpatient Hospital Stay: Payer: Medicare Other

## 2021-03-29 DIAGNOSIS — M7989 Other specified soft tissue disorders: Secondary | ICD-10-CM | POA: Diagnosis not present

## 2021-03-29 LAB — COMPREHENSIVE METABOLIC PANEL
ALT: 6 U/L (ref 0–44)
AST: 18 U/L (ref 15–41)
Albumin: 1.5 g/dL — ABNORMAL LOW (ref 3.5–5.0)
Alkaline Phosphatase: 59 U/L (ref 38–126)
Anion gap: 7 (ref 5–15)
BUN: 25 mg/dL — ABNORMAL HIGH (ref 8–23)
CO2: 23 mmol/L (ref 22–32)
Calcium: 7.7 mg/dL — ABNORMAL LOW (ref 8.9–10.3)
Chloride: 104 mmol/L (ref 98–111)
Creatinine, Ser: 1.43 mg/dL — ABNORMAL HIGH (ref 0.61–1.24)
GFR, Estimated: 49 mL/min — ABNORMAL LOW (ref >60)
Glucose, Bld: 90 mg/dL (ref 70–99)
Potassium: 4.2 mmol/L (ref 3.5–5.1)
Sodium: 134 mmol/L — ABNORMAL LOW (ref 135–145)
Total Bilirubin: 0.3 mg/dL (ref 0.3–1.2)
Total Protein: 4.4 g/dL — ABNORMAL LOW (ref 6.5–8.1)

## 2021-03-29 LAB — CBC WITH DIFFERENTIAL/PLATELET
Abs Immature Granulocytes: 0.03 10*3/uL (ref 0.00–0.07)
Basophils Absolute: 0 10*3/uL (ref 0.0–0.1)
Basophils Relative: 0 %
Eosinophils Absolute: 0.1 10*3/uL (ref 0.0–0.5)
Eosinophils Relative: 2 %
HCT: 25.1 % — ABNORMAL LOW (ref 39.0–52.0)
Hemoglobin: 7.6 g/dL — ABNORMAL LOW (ref 13.0–17.0)
Immature Granulocytes: 1 %
Lymphocytes Relative: 14 %
Lymphs Abs: 0.6 10*3/uL — ABNORMAL LOW (ref 0.7–4.0)
MCH: 29.9 pg (ref 26.0–34.0)
MCHC: 30.3 g/dL (ref 30.0–36.0)
MCV: 98.8 fL (ref 80.0–100.0)
Monocytes Absolute: 0.4 10*3/uL (ref 0.1–1.0)
Monocytes Relative: 9 %
Neutro Abs: 3.3 10*3/uL (ref 1.7–7.7)
Neutrophils Relative %: 74 %
Platelets: 208 10*3/uL (ref 150–400)
RBC: 2.54 MIL/uL — ABNORMAL LOW (ref 4.22–5.81)
RDW: 17.1 % — ABNORMAL HIGH (ref 11.5–15.5)
WBC: 4.4 10*3/uL (ref 4.0–10.5)
nRBC: 0 % (ref 0.0–0.2)

## 2021-03-29 LAB — PHOSPHORUS: Phosphorus: 3 mg/dL (ref 2.5–4.6)

## 2021-03-29 LAB — MAGNESIUM: Magnesium: 2.3 mg/dL (ref 1.7–2.4)

## 2021-03-29 MED ORDER — MORPHINE SULFATE (PF) 2 MG/ML IV SOLN
1.0000 mg | INTRAVENOUS | Status: DC | PRN
  Administered 2021-03-29 – 2021-04-03 (×6): 1 mg via INTRAVENOUS
  Filled 2021-03-29 (×6): qty 1

## 2021-03-29 MED ORDER — HEPARIN (PORCINE) 25000 UT/250ML-% IV SOLN
1550.0000 [IU]/h | INTRAVENOUS | Status: DC
  Administered 2021-03-29: 900 [IU]/h via INTRAVENOUS
  Administered 2021-03-30: 1200 [IU]/h via INTRAVENOUS
  Administered 2021-03-31 – 2021-04-01 (×2): 1450 [IU]/h via INTRAVENOUS
  Administered 2021-04-02 – 2021-04-03 (×2): 1500 [IU]/h via INTRAVENOUS
  Filled 2021-03-29 (×7): qty 250

## 2021-03-29 NOTE — [facility] Notes (Addendum)
ANTICOAGULATION CONSULT NOTE - Initial Consult  Pharmacy Consult for heparin Indication:  VTE treatment  No Known Allergies  Patient Measurements: Weight: 75.9 kg (167 lb 5.3 oz) Heparin Dosing Weight: 75kg  Vital Signs: Temp: 98.1 F (36.7 C) (08/11 1239) Temp Source: Axillary (08/11 1239) BP: 139/62 (08/11 1239) Pulse Rate: 71 (08/11 1239)  Labs: Recent Labs    03/27/21 0220 03/28/21 0220 03/29/21 0022  HGB 8.2* 7.9* 7.6*  HCT 27.0* 26.0* 25.1*  PLT 173 203 208  CREATININE 1.37* 1.44* 1.43*    Estimated Creatinine Clearance: 42.8 mL/min (A) (by C-G formula based on SCr of 1.43 mg/dL (H)).   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 H/H remains low but is stable. Pharmacy asked to begin IV heparin with no bolus with new acute DVT, will target lower goal with recent infarct and ongoing anemia. Last apixaban dose was ~10 days ago so will defer aPTT.  Goal of Therapy:  Heparin level 0.3-0.5 units/ml Monitor platelets by anticoagulation protocol: Yes   Plan:  Heparin no bolus 900 units/h Check heparin level in 8h  Arrie Senate, PharmD, [place], [facility] Clinical Pharmacist [phone] Please check AMION for all [place] numbers 03/29/2021

## 2021-03-29 NOTE — Progress Notes (Signed)
PROGRESS NOTE    Scott Mcdowell [facility]  BZJ:[id number] DOB: [birth year] DOA: 03/20/2021 PCP: Scott Sanes, MD    No chief complaint on file.   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  He has been hospitalized at [facility] multiple times in the last few months.  His right ankle pressure wound has been getting progressively worse with some drainage. In the ED patient was noted to be hypotensive with blood pressures in the 70-80s, hemoglobin was 5.4, creatinine was 2.5.   No history of overt blood loss. 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 -Blood Cx w/ MSSA Bacteremia-secondary to right ankle osteomyelitis, now on Ancef -Palliative following  Assessment & Plan:   Principal Problem:   Acute on chronic anemia Active Problems:   ARF (acute renal failure) (HCC)   Decubitus ulcer of right ankle, stage 4 (HCC) - present on admission   CKD (chronic kidney disease), stage IV (HCC)   GIB (gastrointestinal bleeding)   Multiple myeloma not having achieved remission (HCC)   Anemia in chronic kidney disease   Hypotension due to hypovolemia   Dysphagia   Expressive aphasia   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 (HCC)   SIRS (systemic inflammatory response syndrome) (HCC)   Staphylococcus aureus bacteremia  Severe Sepsis MSSA bacteremia Right ankle wound, right ankle osteomyelitis, pressure wound -Continue IV Ancef - MSSA + 8/2 - repeat cultures 8/4 negative -Appreciate infectious disease input, recommended line holiday and port removal - Port removed 8/6 by IR -Continue wound  care -Multiple discussions with family suggesting palliative, hospice and comfort focused care as most appropriate in this patient by previous MD -Palliative team following, continue full scope  -IR placed PICC 0/17, now complicated by DVT, currently functioning, will plan for anticoagulation for now -ID recommends 6 weeks of IV Ancef.  No recommendation for echo in this setting (echo previously done on 8/3 - EF 45-50%, RWMA, see report).    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  1-39% R ICA stenosis.   -Suspect hypotension is multifactorial, likely dehydration, blood loss and infection contributing, blood pressure more stable now -Eliquis on hold in the setting of severe anemia - follow for consideration of resumption (if he does well on heparin as noted below) -Appreciate neurology input -> noted L hemispheric watershed infarct in setting of hypoperfusion from MSSA bacteremia with underlying LICA chronic occlussion - recommended palliative care, no recommendation for further aggressive neurovascular workup - neurology recommended maintaining SBP >140 -PT OT, SLP evaluations completed, home therapy recommended, he is total care at baseline  Acute DVT Involving Right Axillary Vein  Acute Superficial Vein Thrombosis involving R Basilic Vein  Catheter Associated DVT - follow on heparin gtt, if stable on  this, will plan for doac - PICC is functioning, will maintain at this time -  follow RUE swelling while on anticoagulation, monitor for need to remove pICC  Acute on chronic anemia -Suspect this is multifactorial, has underlying multiple myeloma, chronic kidney disease, no history of melena or hematochezia however he was heme positive. - watch hb closely in setting of starting anticoagulation  -Transfused 2 units of PRBC, anemia panel suggestive of chronic disease, no iron deficiency -He is not Scott Mcdowell candidate for endoscopic evaluation in the setting with acute CVA, bacteremia etc. -Hemoglobin is stable now, no evidence of active bleeding, -Changed PPI to p.o   Hypotension -See discussion above, likely multifactorial, secondary to bacteremia, severe anemia, dehydration likely contributing, antibiotics as above -Blood pressure stable now - neurology recommending maintaining SBP >140 due to risk of stroke in setting of hypoperfusion   Mildly Reduced EF  Regional Wall Motion Abnormalities Appears new, continue to follow Follow up pending additional goc discussions  CKD 4 -Baseline creatinine around 1.5, worse in the setting of severe anemia, hypotension etc. -Now stable, IV fluids discontinued   Dysphagia -SLP evaluation ongoing, continue dysphagia diet   Multiple myeloma -On Revlimid at baseline, this is on hold in the setting of bacteremia/infection   Hypokalemia -Replaced   Severe protein calorie malnutrition -Add supplements as tolerated   Ethics -This is Scott Mcdowell chronically ill elderly male who is bedbound, right hemiplegia, pressure wounds with significant functional decline over the last 6 months to Scott Mcdowell year, multiple myeloma, H/o CVA w/ Hemiplegia, CKD, now with acute CVA, bacteremia, severe anemia, protein calorie malnutrition. -Prior hospitalist discussed overall poor prognosis with patient's daughter and spouse, recommended DNR and symptom/comfort focused care -Palliative consult appreciated, family requests full scope of care  DVT prophylaxis: heparin  gtt Code Status: full  Family Communication: discussed with wife over phone Disposition:   Status is: Inpatient  Remains inpatient appropriate because:Inpatient level of care appropriate due to severity of illness  Dispo: The patient is from: Home              Anticipated d/c is to: Home              Patient currently is not medically stable to d/c.   Difficult to place patient No       Consultants:  neurology  Procedures:   Summary:     Right:  Unable to assess Tynslee Bowlds large portion of the venous system due to the patient's  positioning, and pain tolerance. Visualized portions appeared patent and  compressible.     Left:  No evidence of thrombosis in the subclavian.     Echo IMPRESSIONS     1. Left ventricular ejection fraction, by estimation, is 45 to 50%. The  left ventricle has mildly decreased function. The left ventricle  demonstrates regional wall motion abnormalities (see scoring  diagram/findings for description). There is mild left  ventricular hypertrophy. Left ventricular diastolic parameters are  indeterminate.   2. Right ventricular systolic function is normal. The right ventricular  size is normal.   3. Left atrial size was mildly dilated.   4. The mitral valve is normal in structure. Moderate mitral valve  regurgitation. No evidence of mitral stenosis.   5. Tricuspid valve regurgitation is moderate.   6. The aortic valve is tricuspid. There is moderate calcification of the  aortic valve. There is moderate thickening of the aortic valve. Aortic  valve regurgitation is not visualized. Mild to moderate aortic valve  sclerosis/calcification is  present,  without any evidence of aortic stenosis.   7. The inferior vena cava is normal in size with greater than 50%  respiratory variability, suggesting right atrial pressure of 3 mmHg.   Conclusion(s)/Recommendation(s): No intracardiac source of embolism  detected on this transthoracic study. Olander Friedl transesophageal  echocardiogram is  recommended to exclude cardiac source of embolism if clinically indicated.   Carotid US Summary:  Right Carotid: Velocities in the right ICA are consistent with Seymore Brodowski 1-39%  stenosis.                 Non-hemodynamically significant plaque <50% noted in the  CCA. The                 ECA appears <50% stenosed.   Left Carotid: Velocities in the left ICA are consistent with Adriauna Campton 80-99%  stenosis.                Non-hemodynamically significant plaque <50% noted in the  CCA. The                ECA appears <50% stenosed.   Vertebrals:  Right vertebral artery demonstrates antegrade flow. Left  vertebral               artery demonstrates high resistant flow with loss of  diastolic               flow.  Subclavians: Normal flow hemodynamics were seen in the right subclavian  artery.               Abnormal flow seen in left subclavian artery (LUE AVF).  Antimicrobials: Anti-infectives (From admission, onward)    Start     Dose/Rate Route Frequency Ordered Stop   03/24/21 2200  ceFAZolin (ANCEF) IVPB 2g/100 mL premix        2 g 200 mL/hr over 30 Minutes Intravenous Every 8 hours 03/24/21 1522     03/22/21 0000  metroNIDAZOLE (FLAGYL) IVPB 500 mg  Status:  Discontinued        500 mg 100 mL/hr over 60 Minutes Intravenous Every 12 hours 03/21/21 1214 03/25/21 1228   03/21/21 2200  ceFAZolin (ANCEF) IVPB 2g/100 mL premix  Status:  Discontinued        2 g 200 mL/hr over 30 Minutes Intravenous Every 12 hours 03/21/21 1214 03/24/21 1522   03/21/21 0230  piperacillin-tazobactam (ZOSYN) IVPB 3.375 g  Status:  Discontinued        3.375 g 12.5 mL/hr over 240 Minutes Intravenous Every 8 hours 03/20/21 2106 03/21/21 1214   03/20/21 2200  piperacillin-tazobactam (ZOSYN) IVPB 3.375 g  Status:  Discontinued        3.375 g 100 mL/hr over 30 Minutes Intravenous Every 8 hours 03/20/21 2104 03/20/21 2106   03/20/21 2100  vancomycin variable dose per unstable renal function (pharmacist dosing)   Status:  Discontinued         Does not apply See admin instructions 03/20/21 2100 03/21/21 1214   03/20/21 1530  piperacillin-tazobactam (ZOSYN) IVPB 3.375 g        3.375 g 100 mL/hr over 30 Minutes Intravenous  Once 03/20/21 1515 03/20/21 1922   03/20/21 1530  vancomycin (VANCOREADY) IVPB 1500 mg/300 mL        1,500 mg 150 mL/hr over 120 Minutes Intravenous  Once 03/20/21 1515 03/20/21 1835          Subjective: aphasic  Objective: Vitals:   03/29/21 0820 03/29/21 1239 03/29/21 1659 03/29/21 2000  BP: Marland Kitchen)  145/48 139/62 (!) 118/51 132/69  Pulse: 72 71 74 86  Resp: _0 Temp: 98.2 F (36.8 C) 98.1 F (36.7 C) 97.9 F (36.6 C) 98 F (36.7 C)  TempSrc: Axillary Axillary Axillary Axillary  SpO2: 95% 97% 96% 96%  Weight:        Intake/Output Summary (Last 24 hours) at 03/29/2021 2041 Last data filed at 03/29/2021 1807 Gross per 24 hour  Intake 553.49 ml  Output 800 ml  Net -246.51 ml   Filed Weights   03/20/21 1500  Weight: 75.9 kg    Examination:  General: No acute distress. Cardiovascular: RRR Lungs: unlabored Abdomen: Soft, nontender, nondistended  Neurological: aphasia, right sided weakness Extremities: prafo boots bilaterally, dressing intact, RUE swelling   Data Reviewed: I have personally reviewed following labs and imaging studies  CBC: Recent Labs  Lab 03/25/21 0631 03/26/21 0225 03/27/21 0220 03/28/21 0220 03/29/21 0022  WBC 4.1 2.9* 3.6* 4.0 4.4  NEUTROABS  --   --   --   --  3.3  HGB 9.0* 8.2* 8.2* 7.9* 7.6*  HCT 27.3* 27.2* 27.0* 26.0* 25.1*  MCV 92.2 98.6 97.8 97.4 98.8  PLT 189 165 173 203 010    Basic Metabolic Panel: Recent Labs  Lab 03/25/21 0631 03/26/21 0225 03/27/21 0220 03/28/21 0220 03/29/21 0022  NA 139 135 134* 135 134*  K 3.8 4.1 4.2 4.6 4.2  CL 108 107 106 105 104  CO2 _1 GLUCOSE 113* 81 81 77 90  BUN 33* 29* 26* 26* 25*  CREATININE 1.63* 1.39* 1.37* 1.44* 1.43*  CALCIUM 7.8* 7.6* 7.7*  7.9* 7.7*  MG 2.4  --   --   --  2.3  PHOS  --   --   --   --  3.0    GFR: Estimated Creatinine Clearance: 42.8 mL/min (Lani Mendiola) (by C-G formula based on SCr of 1.43 mg/dL (H)).  Liver Function Tests: Recent Labs  Lab 03/29/21 0022  AST 18  ALT 6  ALKPHOS 59  BILITOT 0.3  PROT 4.4*  ALBUMIN 1.5*    CBG: No results for input(s): GLUCAP in the last 168 hours.   Recent Results (from the past 240 hour(s))  Resp Panel by RT-PCR (Flu Saralynn Langhorst&B, Covid) Nasopharyngeal Swab     Status: None   Collection Time: 03/20/21 12:27 PM   Specimen: Nasopharyngeal Swab; Nasopharyngeal(NP) swabs in vial transport medium  Result Value Ref Range Status   SARS Coronavirus 2 by RT PCR NEGATIVE NEGATIVE Final    Comment: (NOTE) SARS-CoV-2 target nucleic acids are NOT DETECTED.  The SARS-CoV-2 RNA is generally detectable in upper respiratory specimens during the acute phase of infection. The lowest concentration of SARS-CoV-2 viral copies this assay can detect is 138 copies/mL. Blaze Nylund negative result does not preclude SARS-Cov-2 infection and should not be used as the sole basis for treatment or other patient management decisions. Belva Koziel negative result may occur with  improper specimen collection/handling, submission of specimen other than nasopharyngeal swab, presence of viral mutation(s) within the areas targeted by this assay, and inadequate number of viral copies(<138 copies/mL). Marykatherine Sherwood negative result must be combined with clinical observations, patient history, and epidemiological information. The expected result is Negative.  Fact Sheet for Patients:  EntrepreneurPulse.com.au  Fact Sheet for Healthcare Providers:  IncredibleEmployment.be  This test is no t yet approved or cleared by the [place] FDA and  has been authorized for detection and/or diagnosis of SARS-CoV-2 by FDA under  an Emergency Use Authorization (EUA). This EUA will remain  in effect (meaning this test  can be used) for the duration of the COVID-19 declaration under Section 564(b)(1) of the Act, 21 U.S.C.section 360bbb-3(b)(1), unless the authorization is terminated  or revoked sooner.       Influenza Doratha Mcswain by PCR NEGATIVE NEGATIVE Final   Influenza B by PCR NEGATIVE NEGATIVE Final    Comment: (NOTE) The Xpert Xpress SARS-CoV-2/FLU/RSV plus assay is intended as an aid in the diagnosis of influenza from Nasopharyngeal swab specimens and should not be used as Dennys Traughber sole basis for treatment. Nasal washings and aspirates are unacceptable for Xpert Xpress SARS-CoV-2/FLU/RSV testing.  Fact Sheet for Patients: EntrepreneurPulse.com.au  Fact Sheet for Healthcare Providers: IncredibleEmployment.be  This test is not yet approved or cleared by the [place] FDA and has been authorized for detection and/or diagnosis of SARS-CoV-2 by FDA under an Emergency Use Authorization (EUA). This EUA will remain in effect (meaning this test can be used) for the duration of the COVID-19 declaration under Section 564(b)(1) of the Act, 21 U.S.C. section 360bbb-3(b)(1), unless the authorization is terminated or revoked.  Performed at [facility], [place] [address] [place], [place] [address]   Blood Culture (routine x 2)     Status: Abnormal   Collection Time: 03/20/21 12:56 PM   Specimen: BLOOD RIGHT FOREARM  Result Value Ref Range Status   Specimen Description BLOOD RIGHT FOREARM  Final   Special Requests   Final    BOTTLES DRAWN AEROBIC AND ANAEROBIC Blood Culture results may not be optimal due to an inadequate volume of blood received in culture bottles   Culture  Setup Time   Final    GRAM POSITIVE COCCI IN CLUSTERS IN BOTH AEROBIC AND ANAEROBIC BOTTLES Organism ID to follow CRITICAL RESULT CALLED TO, READ BACK BY AND VERIFIED WITH: PHARMD ALEX LAWLESS 03/21/21_0  BY TW Performed at [facility], [place] [address], [place], [place] [address]    Culture  STAPHYLOCOCCUS AUREUS (Nolah Krenzer)  Final   Report Status 03/23/2021 FINAL  Final   Organism ID, Bacteria STAPHYLOCOCCUS AUREUS  Final      Susceptibility   Staphylococcus aureus - MIC*    CIPROFLOXACIN <=0.5 SENSITIVE Sensitive     ERYTHROMYCIN >=8 RESISTANT Resistant     GENTAMICIN <=0.5 SENSITIVE Sensitive     OXACILLIN <=0.25 SENSITIVE Sensitive     TETRACYCLINE >=16 RESISTANT Resistant     VANCOMYCIN <=0.5 SENSITIVE Sensitive     TRIMETH/SULFA <=10 SENSITIVE Sensitive     CLINDAMYCIN >=8 RESISTANT Resistant     RIFAMPIN <=0.5 SENSITIVE Sensitive     Inducible Clindamycin NEGATIVE Sensitive     * STAPHYLOCOCCUS AUREUS  Blood Culture ID Panel (Reflexed)     Status: Abnormal   Collection Time: 03/20/21 12:56 PM  Result Value Ref Range Status   Enterococcus faecalis NOT DETECTED NOT DETECTED Final   Enterococcus Faecium NOT DETECTED NOT DETECTED Final   Listeria monocytogenes NOT DETECTED NOT DETECTED Final   Staphylococcus species DETECTED (Marte Celani) NOT DETECTED Final    Comment: CRITICAL RESULT CALLED TO, READ BACK BY AND VERIFIED WITH: PHARMD ALEX LAWLESS 03/21/21_1  BY TW    Staphylococcus aureus (BCID) DETECTED (Demitrios Molyneux) NOT DETECTED Final    Comment: CRITICAL RESULT CALLED TO, READ BACK BY AND VERIFIED WITH: PHARMD ALEX LAWLESS 03/21/21_2  BY TW    Staphylococcus epidermidis NOT DETECTED NOT DETECTED Final   Staphylococcus lugdunensis NOT DETECTED NOT DETECTED Final   Streptococcus species NOT DETECTED  NOT DETECTED Final   Streptococcus agalactiae NOT DETECTED NOT DETECTED Final   Streptococcus pneumoniae NOT DETECTED NOT DETECTED Final   Streptococcus pyogenes NOT DETECTED NOT DETECTED Final   Vaeda Westall.calcoaceticus-baumannii NOT DETECTED NOT DETECTED Final   Bacteroides fragilis NOT DETECTED NOT DETECTED Final   Enterobacterales NOT DETECTED NOT DETECTED Final   Enterobacter cloacae complex NOT DETECTED NOT DETECTED Final   Escherichia coli NOT DETECTED NOT DETECTED Final   Klebsiella  aerogenes NOT DETECTED NOT DETECTED Final   Klebsiella oxytoca NOT DETECTED NOT DETECTED Final   Klebsiella pneumoniae NOT DETECTED NOT DETECTED Final   Proteus species NOT DETECTED NOT DETECTED Final   Salmonella species NOT DETECTED NOT DETECTED Final   Serratia marcescens NOT DETECTED NOT DETECTED Final   Haemophilus influenzae NOT DETECTED NOT DETECTED Final   Neisseria meningitidis NOT DETECTED NOT DETECTED Final   Pseudomonas aeruginosa NOT DETECTED NOT DETECTED Final   Stenotrophomonas maltophilia NOT DETECTED NOT DETECTED Final   Candida albicans NOT DETECTED NOT DETECTED Final   Candida auris NOT DETECTED NOT DETECTED Final   Candida glabrata NOT DETECTED NOT DETECTED Final   Candida krusei NOT DETECTED NOT DETECTED Final   Candida parapsilosis NOT DETECTED NOT DETECTED Final   Candida tropicalis NOT DETECTED NOT DETECTED Final   Cryptococcus neoformans/gattii NOT DETECTED NOT DETECTED Final   Meth resistant mecA/C and MREJ NOT DETECTED NOT DETECTED Final    Comment: Performed at [facility], [address], [place], [place] [address]  Culture, blood (routine x 2)     Status: None   Collection Time: 03/22/21  8:34 AM   Specimen: BLOOD RIGHT HAND  Result Value Ref Range Status   Specimen Description BLOOD RIGHT HAND  Final   Special Requests   Final    BOTTLES DRAWN AEROBIC AND ANAEROBIC Blood Culture adequate volume   Culture   Final    NO GROWTH 5 DAYS Performed at [facility], [place] [address], [place], [place] [address]    Report Status 03/27/2021 FINAL  Final  Culture, blood (routine x 2)     Status: None   Collection Time: 03/22/21  8:42 AM   Specimen: BLOOD RIGHT HAND  Result Value Ref Range Status   Specimen Description BLOOD RIGHT HAND  Final   Special Requests   Final    BOTTLES DRAWN AEROBIC AND ANAEROBIC Blood Culture results may not be optimal due to an inadequate volume of blood received in culture bottles   Culture   Final    NO GROWTH 5  DAYS Performed at [facility], [place] [address] [place], [place] [address]    Report Status 03/27/2021 FINAL  Final         Radiology Studies: VAS [place] UPPER EXTREMITY VENOUS DUPLEX  Result Date: 03/29/2021 UPPER VENOUS STUDY  Patient Name:  Scott Mcdowell  Date of Exam:   03/29/2021 Medical Rec #: [id number]                    Accession #:    [id number] Date of Birth: 05-11-1941                    Patient Gender: M Patient Age:   86 years Exam Location:  [facility] Procedure:      VAS [place] UPPER EXTREMITY VENOUS DUPLEX Referring Phys: Annaliz Aven POWELL JR --------------------------------------------------------------------------------  Indications: Worsening swelling, Eliquis on hold, PICC right arm Limitations: Patient tissue properties, limited mobility  and pain tolerance. Comparison Study: 03-26-2021 Right upper extremity venous was severely limited                   due to patient positioning and pain tolerance. Visualized                   portions were negative for DVT. Performing Technologist: Maudry Mayhew RDMS, RVT, RDCS Supporting Technologist: Darlin Coco RDMS, RVT  Examination Guidelines: Yassen Kinnett complete evaluation includes B-mode imaging, spectral Doppler, color Doppler, and power Doppler as needed of all accessible portions of each vessel. Bilateral testing is considered an integral part of Jhovany Weidinger complete examination. Limited examinations for reoccurring indications may be performed as noted.  Right Findings: +--------+------------+---------+-----------+----------+-------+ RIGHT   CompressiblePhasicitySpontaneousPropertiesSummary +--------+------------+---------+-----------+----------+-------+ Axillary    None       No        No                Acute  +--------+------------+---------+-----------+----------+-------+ Brachial    Full       Yes       Yes                      +--------+------------+---------+-----------+----------+-------+ Radial      Full                                           +--------+------------+---------+-----------+----------+-------+ Ulnar       Full                                          +--------+------------+---------+-----------+----------+-------+ Cephalic    Full                                          +--------+------------+---------+-----------+----------+-------+ Basilic   Partial      Yes       Yes               Acute  +--------+------------+---------+-----------+----------+-------+  Summary:  Right: Findings consistent with acute deep vein thrombosis involving the right axillary vein. Findings consistent with acute superficial vein thrombosis involving the right basilic vein.  *See table(s) above for measurements and observations.    Preliminary    IR PICC PLACEMENT LEFT >5 YRS INC IMG GUIDE  Result Date: 03/28/2021 INDICATION: Right ankle osteomyelitis, in need of long-term IV antibiotics. Request for PICC line placement. EXAM: ULTRASOUND AND FLUOROSCOPIC GUIDED PICC LINE INSERTION MEDICATIONS: 1% lidocaine CONTRAST:  None FLUOROSCOPY TIME:  18 seconds (1 mGy) COMPLICATIONS: None immediate. TECHNIQUE: The procedure, risks, benefits, and alternatives were explained to the patient and informed written consent was obtained. The right upper extremity was prepped with chlorhexidine in Tyjai Charbonnet sterile fashion, and Dequante Tremaine sterile drape was applied covering the operative field. Maximum barrier sterile technique with sterile gowns and gloves were used for the procedure. Lamont Glasscock timeout was performed prior to the initiation of the procedure. Local anesthesia was provided with 1% lidocaine. After the overlying soft tissues were anesthetized with 1% lidocaine, Lizzy Hamre micropuncture kit was utilized to access the right brachial vein. Real-time ultrasound guidance was utilized for vascular access including the acquisition of Marjoria Mancillas permanent ultrasound image documenting patency of the accessed vessel.  Haniah Penny guidewire was advanced to the level of  the superior caval-atrial junction for measurement purposes and the PICC line was cut to length. Adonai Selsor peel-away sheath was placed and Reneka Nebergall 37 cm, 5 [place], dual lumen was inserted to level of the superior caval-atrial junction. Hayat Warbington post procedure spot fluoroscopic was obtained. The catheter easily aspirated and flushed and was secured in place. Silvestre Mines dressing was placed. The patient tolerated the procedure well without immediate post procedural complication. FINDINGS: After catheter placement, the tip lies within the superior cavoatrial junction. The catheter aspirates and flushes normally and is ready for immediate use. IMPRESSION: Successful ultrasound and fluoroscopic guided placement of Jin Capote right brachial vein approach, 37 cm, 5 French, dual lumen PICC with tip at the superior caval-atrial junction. The PICC line is ready for immediate use. Read by: Durenda Guthrie, PA-C Electronically Signed   By: Markus Daft M.D.   On: 03/28/2021 12:02        Scheduled Meds:  acetaminophen  650 mg Oral BID   Chlorhexidine Gluconate Cloth  6 each Topical Daily   collagenase   Topical Daily   darbepoetin (ARANESP) injection - NON-DIALYSIS  40 mcg Subcutaneous Q Thu-1800   ferrous sulfate  325 mg Oral Q breakfast   metoprolol tartrate  25 mg Oral BID   pantoprazole  40 mg Oral Q1200   senna-docusate  2 tablet Oral BID   thiamine injection  100 mg Intravenous Daily   Or   thiamine  100 mg Oral Daily   vitamin B-12  1,000 mcg Oral Daily   Continuous Infusions:  sodium chloride      ceFAZolin (ANCEF) IV 2 g (03/29/21 1312)   heparin 900 Units/hr (03/29/21 1743)     LOS: 9 days    Time spent: over 30 min    Fayrene Helper, MD [facility]   To contact the attending provider between 7A-7P or the covering provider during after hours 7P-7A, please log into the web site [email or web address] and access using universal [place] password for that web site. If you do not have the password, please call the hospital  operator.  03/29/2021, 8:41 PM

## 2021-03-29 NOTE — Progress Notes (Signed)
Upper extremity venous RT study completed.  Preliminary results relayed to Florene Glen, MD and Erasmo Downer, RN.  See CV Proc for preliminary results report.   Darlin Coco, RDMS, RVT

## 2021-03-30 ENCOUNTER — Inpatient Hospital Stay: Payer: Medicare Other

## 2021-03-30 LAB — COMPREHENSIVE METABOLIC PANEL
ALT: <5 U/L (ref 0–44)
AST: 16 U/L (ref 15–41)
Albumin: 1.4 g/dL — ABNORMAL LOW (ref 3.5–5.0)
Alkaline Phosphatase: 65 U/L (ref 38–126)
Anion gap: 4 — ABNORMAL LOW (ref 5–15)
BUN: 22 mg/dL (ref 8–23)
CO2: 25 mmol/L (ref 22–32)
Calcium: 7.7 mg/dL — ABNORMAL LOW (ref 8.9–10.3)
Chloride: 106 mmol/L (ref 98–111)
Creatinine, Ser: 1.4 mg/dL — ABNORMAL HIGH (ref 0.61–1.24)
GFR, Estimated: 50 mL/min — ABNORMAL LOW (ref >60)
Glucose, Bld: 91 mg/dL (ref 70–99)
Potassium: 3.8 mmol/L (ref 3.5–5.1)
Sodium: 135 mmol/L (ref 135–145)
Total Bilirubin: 0.5 mg/dL (ref 0.3–1.2)
Total Protein: 4.2 g/dL — ABNORMAL LOW (ref 6.5–8.1)

## 2021-03-30 LAB — CBC WITH DIFFERENTIAL/PLATELET
Abs Immature Granulocytes: 0.04 10*3/uL (ref 0.00–0.07)
Basophils Absolute: 0 10*3/uL (ref 0.0–0.1)
Basophils Relative: 0 %
Eosinophils Absolute: 0.1 10*3/uL (ref 0.0–0.5)
Eosinophils Relative: 2 %
HCT: 24.5 % — ABNORMAL LOW (ref 39.0–52.0)
Hemoglobin: 7.5 g/dL — ABNORMAL LOW (ref 13.0–17.0)
Immature Granulocytes: 1 %
Lymphocytes Relative: 12 %
Lymphs Abs: 0.6 10*3/uL — ABNORMAL LOW (ref 0.7–4.0)
MCH: 29.9 pg (ref 26.0–34.0)
MCHC: 30.6 g/dL (ref 30.0–36.0)
MCV: 97.6 fL (ref 80.0–100.0)
Monocytes Absolute: 0.3 10*3/uL (ref 0.1–1.0)
Monocytes Relative: 7 %
Neutro Abs: 3.6 10*3/uL (ref 1.7–7.7)
Neutrophils Relative %: 78 %
Platelets: 241 10*3/uL (ref 150–400)
RBC: 2.51 MIL/uL — ABNORMAL LOW (ref 4.22–5.81)
RDW: 16.9 % — ABNORMAL HIGH (ref 11.5–15.5)
WBC: 4.7 10*3/uL (ref 4.0–10.5)
nRBC: 0 % (ref 0.0–0.2)

## 2021-03-30 LAB — MAGNESIUM: Magnesium: 2.2 mg/dL (ref 1.7–2.4)

## 2021-03-30 LAB — HEPARIN LEVEL (UNFRACTIONATED)
Heparin Unfractionated: <0.1 IU/mL — ABNORMAL LOW (ref 0.30–0.70)
Heparin Unfractionated: 0.19 [IU]/mL — ABNORMAL LOW (ref 0.30–0.70)

## 2021-03-30 LAB — PHOSPHORUS: Phosphorus: 2.8 mg/dL (ref 2.5–4.6)

## 2021-03-30 MED ORDER — SODIUM CHLORIDE 0.9% FLUSH
10.0000 mL | Freq: Two times a day (BID) | INTRAVENOUS | Status: DC
  Administered 2021-03-30 – 2021-04-03 (×7): 10 mL

## 2021-03-30 MED ORDER — SODIUM CHLORIDE 0.9% FLUSH: 10.0000 mL | INTRAVENOUS | Status: DC | PRN

## 2021-03-30 MED ORDER — PROSOURCE PLUS PO LIQD
30.0000 mL | Freq: Three times a day (TID) | ORAL | Status: DC
  Administered 2021-03-30 – 2021-04-03 (×13): 30 mL via ORAL
  Filled 2021-03-30 (×13): qty 30

## 2021-03-30 MED ORDER — ADULT MULTIVITAMIN W/MINERALS CH
1.0000 | ORAL_TABLET | Freq: Every day | ORAL | Status: DC
  Administered 2021-03-30 – 2021-04-03 (×5): 1 via ORAL
  Filled 2021-03-30 (×5): qty 1

## 2021-03-30 NOTE — Progress Notes (Signed)
Initial Nutrition Assessment  DOCUMENTATION CODES:   Severe malnutrition in context of chronic illness  INTERVENTION:   -30 ml Prosource Plus TID, each supplement provides 100 kcals and 15 grams protein -MVI with minerals daily -Magic cup TID with meals, each supplement provides 290 kcal and 9 grams of protein  -Hormel Shake TID with meals, each supplement provides 520 kcals and 22 grams protein -Feeding assistance with meals  NUTRITION DIAGNOSIS:   Severe Malnutrition related to chronic illness (multiple myeloma, CVA) as evidenced by severe fat depletion, severe muscle depletion.  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  He has been hospitalized at [facility] multiple times in the last few months.  His right ankle pressure wound has been getting progressively worse with some drainage.  Pt admitted with severe sepsis secondary to rt ankle wound.   8/6- s/p port removal 8/8- s/p BSE- dysphagia 3 diet with thin liquids 8/10- s/p PICC placement  Reviewed I/O's: +553 ml x 24 hours and +4 L since admission  UOP: 300 ml x 24 hours  Pt sleeping at time of visit and did not arouse to voice or touch. No family present at time of visit.   Per chart review, pt requires feeding assistance with meals. Noted meal completion 15-100% (averaging 50% of meals). Pt would greatly benefit from addition of oral nutrition supplements.   Reviewed wt hx; wt has been stable  over the past 9 months, however, noted distant history of weight loss.   Palliative care following for goals of care discussions; family desires aggressive care at this time.   Medications reviewed and include aranesp, ferrous sulfate, senokot, thiamine, and vitamin B-12.   Labs reviewed.   NUTRITION - FOCUSED PHYSICAL EXAM:  Flowsheet Row Most Recent Value  Orbital Region Severe depletion  Upper Arm Region Moderate depletion  Thoracic and Lumbar Region Mild depletion  Buccal Region Severe depletion  Temple Region Moderate depletion  Clavicle Bone Region Severe depletion  Clavicle and Acromion Bone Region Moderate depletion  Scapular Bone Region Moderate depletion  Dorsal Hand Severe depletion  Patellar Region Severe depletion  Anterior Thigh Region Severe depletion  Posterior Calf Region Severe depletion  Edema (RD Assessment) None  Hair Reviewed  Eyes Reviewed  Mouth Reviewed  Skin Reviewed  Nails Reviewed       Diet Order:   Diet Order             DIET DYS 3 Room service appropriate? Yes; Fluid consistency: Thin  Diet effective now                   EDUCATION NEEDS:   Not appropriate for education at this time  Skin:  Skin Assessment: Skin Integrity Issues: Skin Integrity Issues:: Stage II, Stage IV Stage II: rt lateral anterior foot; rt buttocks; rt lumbar Stage IV: rt lateral ankle  Last BM:  03/27/21  Height:   Ht Readings from Last 1 Encounters:  01/05/21 '6\' 3"'  (1.905  m)    Weight:   Wt Readings from Last 1 Encounters:  03/20/21 75.9 kg    Ideal Body Weight:  89.1 kg  BMI:  Body mass index is 20.91 kg/m.  Estimated Nutritional Needs:   Kcal:  3013-1438  Protein:  115-130 grams  Fluid:  > 2 L    Loistine Chance, RD, LDN, [place] Registered Dietitian II Certified Diabetes Care and Education Specialist Please refer to [facility] for RD and/or RD on-call/weekend/after hours pager

## 2021-03-30 NOTE — Plan of Care (Signed)

## 2021-03-30 NOTE — Progress Notes (Addendum)
Occupational Therapy Treatment Patient Details Name: Scott Mcdowell MRN: [id number] DOB: [birth year] Today's Date: 03/30/2021    History of present illness Pt is an 80 year old man admitted on 03/20/21 with R ankle wound and MSSA bacteremia. 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   OT comments  PROM, retrograde massage and elevation performed to R UE. Pt demonstrated ability to self feed diced pears and drink from straw with set up, although he attempted to simply open his mouth for OT to feed him. Pt much more alert and interactive this visit.  Pt declined OOB to chair, he was not all that amenable to repositioning in bed for feeding.   Follow Up Recommendations  No OT follow up    Equipment Recommendations  None recommended by OT    Recommendations for Other Services      Precautions / Restrictions Precautions Precautions: Fall       Mobility Bed Mobility               General bed mobility comments: +2 total to pull up in bed prior to eating    Transfers                      Balance                                           ADL either performed or assessed with clinical judgement   ADL   Eating/Feeding: Set up;Bed level Eating/Feeding Details (indicate cue type and reason): pt able to scoop pears and bring to mouth with some spillage and to drink from cup with straw, opens mouth and awaits to be fed, but can self feed                                         Vision       Perception     Praxis      Cognition Arousal/Alertness: Awake/alert Behavior During Therapy: Flat affect Overall Cognitive Status: Difficult to assess                                 General Comments: stating a hearty "good morning" upon OT's entry, responding yes/no, but with questionable  reliability        Exercises     Shoulder Instructions       General Comments      Pertinent Vitals/ Pain       Pain Assessment: Faces Faces Pain Scale: No hurt  Home Living                                          Prior Functioning/Environment              Frequency  Min 2X/week        Progress Toward Goals  OT Goals(current goals can now be found in the care plan section)  Progress towards OT goals: Progressing toward goals  Acute Rehab OT Goals Patient Stated Goal:  maintain quality of life OT Goal Formulation: With patient/family Time For Goal Achievement: 04/05/21 Potential to Achieve Goals: [place] Discharge plan remains appropriate    Co-evaluation                 AM-PAC OT "6 Clicks" Daily Activity     Outcome Measure   Help from another person eating meals?: A Little Help from another person taking care of personal grooming?: A Lot Help from another person toileting, which includes using toliet, bedpan, or urinal?: Total Help from another person bathing (including washing, rinsing, drying)?: Total Help from another person to put on and taking off regular upper body clothing?: Total Help from another person to put on and taking off regular lower body clothing?: Total 6 Click Score: 9    End of Session    OT Visit Diagnosis: Hemiplegia and hemiparesis;Muscle weakness (generalized) (M62.81) Hemiplegia - Right/Left: Right Hemiplegia - caused by: Cerebral infarction   Activity Tolerance Patient tolerated treatment well   Patient Left in bed;with call bell/phone within reach;with bed alarm set   Nurse Communication          Time: 4967-5916 OT Time Calculation (min): 18 min  Charges: OT General Charges $OT Visit: 1 Visit OT Treatments $Therapeutic Activity: 8-22 mins  Nestor Lewandowsky, OTR/L Acute Rehabilitation Services Pager: [phone] Office: [phone]    Malka So 03/30/2021, 10:31  AM

## 2021-03-30 NOTE — Progress Notes (Signed)
ANTICOAGULATION CONSULT NOTE  Pharmacy Consult for heparin Indication:  VTE treatment  No Known Allergies  Patient Measurements: Weight: 75.9 kg (167 lb 5.3 oz) Heparin Dosing Weight: 75kg  Vital Signs: Temp: 98 F (36.7 C) (08/12 0018) Temp Source: Axillary (08/12 0018) BP: 108/49 (08/12 0018) Pulse Rate: 64 (08/12 0018)  Labs: Recent Labs    03/28/21 0220 03/29/21 0022 03/30/21 0305  HGB 7.9* 7.6*  --   HCT 26.0* 25.1*  --   PLT 203 208  --   HEPARINUNFRC  --   --  <0.10*  CREATININE 1.44* 1.43* 1.40*     Estimated Creatinine Clearance: 43.7 mL/min (A) (by C-G formula based on SCr of 1.4 mg/dL (H)).  Assessment: 80 y.o. male with DVT for heparin  Goal of Therapy:  Heparin level 0.3-0.5 units/ml Monitor platelets by anticoagulation protocol: Yes   Plan:  Increase Heparin 1200 units/hr Check heparin level in 8 hours.  Phillis Knack, PharmD, BCPS  03/30/2021

## 2021-03-30 NOTE — Progress Notes (Addendum)
ANTICOAGULATION CONSULT NOTE  Pharmacy Consult for heparin Indication:  VTE treatment  No Known Allergies  Patient Measurements: Weight: 75.9 kg (167 lb 5.3 oz) Heparin Dosing Weight: 75kg  Vital Signs: Temp: 98.2 F (36.8 C) (08/12 1659) Temp Source: Oral (08/12 1659) BP: 104/58 (08/12 1659) Pulse Rate: 81 (08/12 1659)  Labs: Recent Labs    03/28/21 0220 03/29/21 0022 03/30/21 0305 03/30/21 1636  HGB 7.9* 7.6* 7.5*  --   HCT 26.0* 25.1* 24.5*  --   PLT 203 208 241  --   HEPARINUNFRC  --   --  <0.10* 0.19*  CREATININE 1.44* 1.43* 1.40*  --      Estimated Creatinine Clearance: 43.7 mL/min (A) (by C-G formula based on SCr of 1.4 mg/dL (H)).  Assessment: 80 y.o. male with acute DVT RUE for heparin infusion. Current Heparin level is 0.19 after heparin rate was increased to 1200 units/hr this morning. This patient was taking apixaban PTA for hx DVT admitted with acute infarcts and anemia. H/H remains low but is stable.  Although current heparin level increased , it remains subtherapeutic.  Targeting INR at lower therapeutic rangeof 0.3-0.5 units/ml in setting of recent infarct and ongoing anemia. Last apixaban dose was ~10 days ago so will defer aPTT. No bleeding reported.   Goal of Therapy:  Heparin level 0.3-0.5 units/ml Monitor platelets by anticoagulation protocol: Yes   Plan:  Increase Heparin to 1350 units/hr Check heparin level in 8 hours.  Nicole Cella, RPh Clinical Pharmacist  03/30/2021 Please check AMION for all [place] phone numbers After 10:00 PM, call [place] [phone]

## 2021-03-30 NOTE — Progress Notes (Signed)
Call to pharmacy for guidance with heparin lab draw. Heparin running through PICC and AVF in opposite arm. Instructions received to pause heparin, flush, and draw lab after 5 minutes.

## 2021-03-30 NOTE — Progress Notes (Signed)
PROGRESS NOTE    [facility]  HMC:[id number] DOB: Aug 09, 1941 DOA: 03/20/2021 PCP: [name], MD    No chief complaint on file.   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  He has been hospitalized at [facility] multiple times in the last few months.  His right ankle pressure wound has been getting progressively worse with some drainage. In the ED patient was noted to be hypotensive with blood pressures in the 70-80s, hemoglobin was 5.4, creatinine was 2.5.   No history of overt blood loss. 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 -Blood Cx w/ MSSA Bacteremia-secondary to right ankle osteomyelitis, now on Ancef -Palliative following  Assessment & Plan:   Principal Problem:   Acute on chronic anemia Active Problems:   ARF (acute renal failure) (HCC)   Decubitus ulcer of right ankle, stage 4 (HCC) - present on admission   CKD (chronic kidney disease), stage IV (HCC)   GIB (gastrointestinal bleeding)   Multiple myeloma not having achieved remission (HCC)   Anemia in chronic kidney disease   Hypotension due to hypovolemia   Dysphagia   Expressive aphasia   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 (HCC)   SIRS (systemic inflammatory response syndrome) (HCC)   Staphylococcus aureus bacteremia  Severe Sepsis MSSA bacteremia Right ankle wound, right ankle osteomyelitis, pressure wound -Continue IV Ancef - MSSA + 8/2 - repeat cultures 8/4 negative -Appreciate infectious disease input, recommended line holiday and port removal - Port removed 8/6 by IR -Continue wound  care -Multiple discussions with family suggesting palliative, hospice and comfort focused care as most appropriate in this patient by previous MD -Palliative team following, continue full scope  -IR placed PICC 6/62, now complicated by DVT, currently functioning, will plan for anticoagulation for now -ID recommends 6 weeks of IV Ancef.  No recommendation for echo in this setting (echo previously done on 8/3 - EF 45-50%, RWMA, see report).    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  1-39% R ICA stenosis.   -Suspect hypotension is multifactorial, likely dehydration, blood loss and infection contributing, blood pressure more stable now -Eliquis on hold in the setting of severe anemia - follow for consideration of resumption (if he does well on heparin as noted below) -Appreciate neurology input -> noted L hemispheric watershed infarct in setting of hypoperfusion from MSSA bacteremia with underlying LICA chronic occlussion - recommended palliative care, no recommendation for further aggressive neurovascular workup - neurology recommended maintaining SBP >140 -PT OT, SLP evaluations completed, home therapy recommended, he is total care at baseline  Acute DVT Involving Right Axillary Vein  Acute Superficial Vein Thrombosis involving R Basilic Vein  Catheter Associated DVT - follow on heparin gtt, if stable on  this, will plan for doac - PICC is functioning, will maintain at this time -  follow RUE swelling while on anticoagulation, monitor for need to remove pICC  Acute on chronic anemia -Suspect this is multifactorial, has underlying multiple myeloma, chronic kidney disease, no history of melena or hematochezia however he was heme positive. - watch hb closely in setting of starting anticoagulation  -Transfused 2 units of PRBC, anemia panel suggestive of chronic disease, no iron deficiency -He is not Melbert Botelho candidate for endoscopic evaluation in the setting with acute CVA, bacteremia etc. -Hemoglobin is stable now, no evidence of active bleeding, -Changed PPI to p.o   Hypotension -See discussion above, likely multifactorial, secondary to bacteremia, severe anemia, dehydration likely contributing, antibiotics as above -Blood pressure stable now - neurology recommending maintaining SBP >140 due to risk of stroke in setting of hypoperfusion   Mildly Reduced EF  Regional Wall Motion Abnormalities Appears new, continue to follow Will discuss with family to plan follow up  CKD 4 -Baseline creatinine around 1.5, worse in the setting of severe anemia, hypotension etc. -Now stable, IV fluids discontinued   Dysphagia -SLP evaluation ongoing, continue dysphagia diet   Multiple myeloma -On Revlimid at baseline, this is on hold in the setting of bacteremia/infection   Hypokalemia -Replaced   Severe protein calorie malnutrition -Add supplements as tolerated   Ethics -This is [facility] chronically ill elderly male who is bedbound, right hemiplegia, pressure wounds with significant functional decline over the last 6 months to [facility] year, multiple myeloma, H/o CVA w/ Hemiplegia, CKD, now with acute CVA, bacteremia, severe anemia, protein calorie malnutrition. -Prior hospitalist discussed overall poor prognosis with patient's daughter and spouse, recommended DNR and symptom/comfort focused care -Palliative consult appreciated, family requests full scope of care  DVT prophylaxis: heparin  gtt Code Status: full  Family Communication: discussed with wife over phone Disposition:   Status is: Inpatient  Remains inpatient appropriate because:Inpatient level of care appropriate due to severity of illness  Dispo: The patient is from: Home              Anticipated d/c is to: Home              Patient currently is not medically stable to d/c.   Difficult to place patient No       Consultants:  neurology  Procedures:   Summary:     Right:  Unable to assess Terralyn Matsumura large portion of the venous system due to the patient's  positioning, and pain tolerance. Visualized portions appeared patent and  compressible.     Left:  No evidence of thrombosis in the subclavian.     Echo IMPRESSIONS     1. Left ventricular ejection fraction, by estimation, is 45 to 50%. The  left ventricle has mildly decreased function. The left ventricle  demonstrates regional wall motion abnormalities (see scoring  diagram/findings for description). There is mild left  ventricular hypertrophy. Left ventricular diastolic parameters are  indeterminate.   2. Right ventricular systolic function is normal. The right ventricular  size is normal.   3. Left atrial size was mildly dilated.   4. The mitral valve is normal in structure. Moderate mitral valve  regurgitation. No evidence of mitral stenosis.   5. Tricuspid valve regurgitation is moderate.   6. The aortic valve is tricuspid. There is moderate calcification of the  aortic valve. There is moderate thickening of the aortic valve. Aortic  valve regurgitation is not visualized. Mild to moderate aortic valve  sclerosis/calcification is present,  without any evidence of aortic stenosis.   7. The inferior vena cava is normal in size with greater than 50%  respiratory variability, suggesting right atrial pressure of 3 mmHg.   Conclusion(s)/Recommendation(s): No intracardiac source of embolism  detected on this transthoracic study. Aoife Bold transesophageal  echocardiogram is  recommended to exclude cardiac source of embolism if clinically indicated.   Carotid US Summary:  Right Carotid: Velocities in the right ICA are consistent with Maleena Eddleman 1-39%  stenosis.                 Non-hemodynamically significant plaque <50% noted in the  CCA. The                 ECA appears <50% stenosed.   Left Carotid: Velocities in the left ICA are consistent with Shelsie Tijerino 80-99%  stenosis.                Non-hemodynamically significant plaque <50% noted in the  CCA. The                ECA appears <50% stenosed.   Vertebrals:  Right vertebral artery demonstrates antegrade flow. Left  vertebral               artery demonstrates high resistant flow with loss of  diastolic               flow.  Subclavians: Normal flow hemodynamics were seen in the right subclavian  artery.               Abnormal flow seen in left subclavian artery (LUE AVF).  Antimicrobials: Anti-infectives (From admission, onward)    Start     Dose/Rate Route Frequency Ordered Stop   03/24/21 2200  ceFAZolin (ANCEF) IVPB 2g/100 mL premix        2 g 200 mL/hr over 30 Minutes Intravenous Every 8 hours 03/24/21 1522     03/22/21 0000  metroNIDAZOLE (FLAGYL) IVPB 500 mg  Status:  Discontinued        500 mg 100 mL/hr over 60 Minutes Intravenous Every 12 hours 03/21/21 1214 03/25/21 1228   03/21/21 2200  ceFAZolin (ANCEF) IVPB 2g/100 mL premix  Status:  Discontinued        2 g 200 mL/hr over 30 Minutes Intravenous Every 12 hours 03/21/21 1214 03/24/21 1522   03/21/21 0230  piperacillin-tazobactam (ZOSYN) IVPB 3.375 g  Status:  Discontinued        3.375 g 12.5 mL/hr over 240 Minutes Intravenous Every 8 hours 03/20/21 2106 03/21/21 1214   03/20/21 2200  piperacillin-tazobactam (ZOSYN) IVPB 3.375 g  Status:  Discontinued        3.375 g 100 mL/hr over 30 Minutes Intravenous Every 8 hours 03/20/21 2104 03/20/21 2106   03/20/21 2100  vancomycin variable dose per unstable renal function (pharmacist dosing)   Status:  Discontinued         Does not apply See admin instructions 03/20/21 2100 03/21/21 1214   03/20/21 1530  piperacillin-tazobactam (ZOSYN) IVPB 3.375 g        3.375 g 100 mL/hr over 30 Minutes Intravenous  Once 03/20/21 1515 03/20/21 1922   03/20/21 1530  vancomycin (VANCOREADY) IVPB 1500 mg/300 mL        1,500 mg 150 mL/hr over 120 Minutes Intravenous  Once 03/20/21 1515 03/20/21 1835          Subjective: Able to get simple words out with effort  Objective: Vitals:   03/30/21  0719 03/30/21 0800 03/30/21 1221 03/30/21 1659  BP: 113/70 132/72 (!) 106/53 (!) 104/58  Pulse: 74 72 69 81  Resp: _0 Temp: 97.7 F (36.5 C)  97.8 F (36.6 C) 98.2 F (36.8 C)  TempSrc: Oral  Oral Oral  SpO2: 97% 96% 95%   Weight:        Intake/Output Summary (Last 24 hours) at 03/30/2021 1819 Last data filed at 03/30/2021 1518 Gross per 24 hour  Intake 868.12 ml  Output --  Net 868.12 ml   Filed Weights   03/20/21 1500  Weight: 75.9 kg    Examination:  General: No acute distress. Cardiovascular: RRR Lungs: Clear to auscultation bilaterally  Abdomen: Soft, nontender, nondistended Neurological: aphasia, able to get some simple 1 word answers out to my questions with significant effort, R sided ewakness Extremities: full thickness wound to R ankle, stage 3 ulcer to L heel - stage 2 to sacrum    Data Reviewed: I have personally reviewed following labs and imaging studies  CBC: Recent Labs  Lab 03/26/21 0225 03/27/21 0220 03/28/21 0220 03/29/21 0022 03/30/21 0305  WBC 2.9* 3.6* 4.0 4.4 4.7  NEUTROABS  --   --   --  3.3 3.6  HGB 8.2* 8.2* 7.9* 7.6* 7.5*  HCT 27.2* 27.0* 26.0* 25.1* 24.5*  MCV 98.6 97.8 97.4 98.8 97.6  PLT 165 173 203 208 790    Basic Metabolic Panel: Recent Labs  Lab 03/25/21 0631 03/26/21 0225 03/27/21 0220 03/28/21 0220 03/29/21 0022 03/30/21 0305  NA 139 135 134* 135 134* 135  K 3.8 4.1 4.2 4.6 4.2 3.8  CL 108 107 106 105 104 106   CO2 _1 GLUCOSE 113* 81 81 77 90 91  BUN 33* 29* 26* 26* 25* 22  CREATININE 1.63* 1.39* 1.37* 1.44* 1.43* 1.40*  CALCIUM 7.8* 7.6* 7.7* 7.9* 7.7* 7.7*  MG 2.4  --   --   --  2.3 2.2  PHOS  --   --   --   --  3.0 2.8    GFR: Estimated Creatinine Clearance: 43.7 mL/min (Solange Emry) (by C-G formula based on SCr of 1.4 mg/dL (H)).  Liver Function Tests: Recent Labs  Lab 03/29/21 0022 03/30/21 0305  AST 18 16  ALT 6 <5  ALKPHOS 59 65  BILITOT 0.3 0.5  PROT 4.4* 4.2*  ALBUMIN 1.5* 1.4*    CBG: No results for input(s): GLUCAP in the last 168 hours.   Recent Results (from the past 240 hour(s))  Culture, blood (routine x 2)     Status: None   Collection Time: 03/22/21  8:34 AM   Specimen: BLOOD RIGHT HAND  Result Value Ref Range Status   Specimen Description BLOOD RIGHT HAND  Final   Special Requests   Final    BOTTLES DRAWN AEROBIC AND ANAEROBIC Blood Culture adequate volume   Culture   Final    NO GROWTH 5 DAYS Performed at [facility], [address], [place], [place] [address]    Report Status 03/27/2021 FINAL  Final  Culture, blood (routine x 2)     Status: None   Collection Time: 03/22/21  8:42 AM   Specimen: BLOOD RIGHT HAND  Result Value Ref Range Status   Specimen Description BLOOD RIGHT HAND  Final   Special Requests   Final    BOTTLES DRAWN AEROBIC AND ANAEROBIC Blood Culture results may not be optimal due to an inadequate volume  of blood received in culture bottles   Culture   Final    NO GROWTH 5 DAYS Performed at [facility], [address] [place], [place] [address]    Report Status 03/27/2021 FINAL  Final         Radiology Studies: VAS [place] UPPER EXTREMITY VENOUS DUPLEX  Result Date: 03/29/2021 UPPER VENOUS STUDY  Patient Name:  LEIGH KAEDING Rambeau  Date of Exam:   03/29/2021 Medical Rec #: [id number]                    Accession #:    [id number] Date of Birth: July 11, 1941                    Patient Gender: M Patient Age:    33 years Exam Location:  [facility] Procedure:      VAS [place] UPPER EXTREMITY VENOUS DUPLEX Referring Phys: Janira Mandell POWELL JR --------------------------------------------------------------------------------  Indications: Worsening swelling, Eliquis on hold, PICC right arm Limitations: Patient tissue properties, limited mobility and pain tolerance. Comparison Study: 03-26-2021 Right upper extremity venous was severely limited                   due to patient positioning and pain tolerance. Visualized                   portions were negative for DVT. Performing Technologist: Maudry Mayhew RDMS, RVT, RDCS Supporting Technologist: Darlin Coco RDMS, RVT  Examination Guidelines: Kursten Kruk complete evaluation includes B-mode imaging, spectral Doppler, color Doppler, and power Doppler as needed of all accessible portions of each vessel. Bilateral testing is considered an integral part of Lynda Wanninger complete examination. Limited examinations for reoccurring indications may be performed as noted.  Right Findings: +--------+------------+---------+-----------+----------+-------+ RIGHT   CompressiblePhasicitySpontaneousPropertiesSummary +--------+------------+---------+-----------+----------+-------+ Axillary    None       No        No                Acute  +--------+------------+---------+-----------+----------+-------+ Brachial    Full       Yes       Yes                      +--------+------------+---------+-----------+----------+-------+ Radial      Full                                          +--------+------------+---------+-----------+----------+-------+ Ulnar       Full                                          +--------+------------+---------+-----------+----------+-------+ Cephalic    Full                                          +--------+------------+---------+-----------+----------+-------+ Basilic   Partial      Yes       Yes               Acute   +--------+------------+---------+-----------+----------+-------+  Summary:  Right: Findings consistent with acute deep vein thrombosis involving the right axillary vein. Findings consistent with acute superficial vein thrombosis involving the right basilic vein.  *See  table(s) above for measurements and observations.    Preliminary         Scheduled Meds:  (feeding supplement) PROSource Plus  30 mL Oral TID BM   acetaminophen  650 mg Oral BID   Chlorhexidine Gluconate Cloth  6 each Topical Daily   collagenase   Topical Daily   darbepoetin (ARANESP) injection - NON-DIALYSIS  40 mcg Subcutaneous Q Thu-1800   ferrous sulfate  325 mg Oral Q breakfast   metoprolol tartrate  25 mg Oral BID   multivitamin with minerals  1 tablet Oral Daily   pantoprazole  40 mg Oral Q1200   senna-docusate  2 tablet Oral BID   sodium chloride flush  10-40 mL Intracatheter Q12H   thiamine injection  100 mg Intravenous Daily   Or   thiamine  100 mg Oral Daily   vitamin B-12  1,000 mcg Oral Daily   Continuous Infusions:  sodium chloride      ceFAZolin (ANCEF) IV 2 g (03/30/21 1445)   heparin 1,200 Units/hr (03/30/21 1733)     LOS: 10 days    Time spent: over 30 min    Fayrene Helper, MD [facility]   To contact the attending provider between 7A-7P or the covering provider during after hours 7P-7A, please log into the web site [email or web address] and access using universal [place] password for that web site. If you do not have the password, please call the hospital operator.  03/30/2021, 6:19 PM

## 2021-03-31 LAB — COMPREHENSIVE METABOLIC PANEL
ALT: <5 U/L (ref 0–44)
AST: 20 U/L (ref 15–41)
Albumin: 1.4 g/dL — ABNORMAL LOW (ref 3.5–5.0)
Alkaline Phosphatase: 61 U/L (ref 38–126)
Anion gap: 6 (ref 5–15)
BUN: 24 mg/dL — ABNORMAL HIGH (ref 8–23)
CO2: 22 mmol/L (ref 22–32)
Calcium: 7.6 mg/dL — ABNORMAL LOW (ref 8.9–10.3)
Chloride: 105 mmol/L (ref 98–111)
Creatinine, Ser: 1.38 mg/dL — ABNORMAL HIGH (ref 0.61–1.24)
GFR, Estimated: 51 mL/min — ABNORMAL LOW (ref >60)
Glucose, Bld: 89 mg/dL (ref 70–99)
Potassium: 3.7 mmol/L (ref 3.5–5.1)
Sodium: 133 mmol/L — ABNORMAL LOW (ref 135–145)
Total Bilirubin: 0.4 mg/dL (ref 0.3–1.2)
Total Protein: 4.3 g/dL — ABNORMAL LOW (ref 6.5–8.1)

## 2021-03-31 LAB — CBC WITH DIFFERENTIAL/PLATELET
Abs Immature Granulocytes: 0.04 10*3/uL (ref 0.00–0.07)
Basophils Absolute: 0 10*3/uL (ref 0.0–0.1)
Basophils Relative: 0 %
Eosinophils Absolute: 0.1 10*3/uL (ref 0.0–0.5)
Eosinophils Relative: 1 %
HCT: 23.1 % — ABNORMAL LOW (ref 39.0–52.0)
Hemoglobin: 7.1 g/dL — ABNORMAL LOW (ref 13.0–17.0)
Immature Granulocytes: 1 %
Lymphocytes Relative: 14 %
Lymphs Abs: 0.7 10*3/uL (ref 0.7–4.0)
MCH: 29.8 pg (ref 26.0–34.0)
MCHC: 30.7 g/dL (ref 30.0–36.0)
MCV: 97.1 fL (ref 80.0–100.0)
Monocytes Absolute: 0.4 10*3/uL (ref 0.1–1.0)
Monocytes Relative: 7 %
Neutro Abs: 3.8 10*3/uL (ref 1.7–7.7)
Neutrophils Relative %: 77 %
Platelets: 270 10*3/uL (ref 150–400)
RBC: 2.38 MIL/uL — ABNORMAL LOW (ref 4.22–5.81)
RDW: 17.1 % — ABNORMAL HIGH (ref 11.5–15.5)
WBC: 4.9 10*3/uL (ref 4.0–10.5)
nRBC: 0 % (ref 0.0–0.2)

## 2021-03-31 LAB — HEMOGLOBIN AND HEMATOCRIT, BLOOD
HCT: 23.5 % — ABNORMAL LOW (ref 39.0–52.0)
Hemoglobin: 7.4 g/dL — ABNORMAL LOW (ref 13.0–17.0)

## 2021-03-31 LAB — PHOSPHORUS: Phosphorus: 3.1 mg/dL (ref 2.5–4.6)

## 2021-03-31 LAB — MAGNESIUM: Magnesium: 2 mg/dL (ref 1.7–2.4)

## 2021-03-31 LAB — HEPARIN LEVEL (UNFRACTIONATED)
Heparin Unfractionated: 0.28 IU/mL — ABNORMAL LOW (ref 0.30–0.70)
Heparin Unfractionated: 0.45 IU/mL (ref 0.30–0.70)
Heparin Unfractionated: 0.4 IU/mL (ref 0.30–0.70)

## 2021-03-31 NOTE — Progress Notes (Signed)
ANTICOAGULATION CONSULT NOTE  Pharmacy Consult for heparin Indication:  VTE treatment  No Known Allergies  Patient Measurements: Weight: 75.9 kg (167 lb 5.3 oz) Heparin Dosing Weight: 75kg  Vital Signs: Temp: 98.9 F (37.2 C) (08/12 2337) Temp Source: Oral (08/12 2337) BP: 86/58 (08/12 2337) Pulse Rate: 74 (08/12 2337)  Labs: Recent Labs    03/29/21 0022 03/30/21 0305 03/30/21 1636 03/31/21 0201  HGB 7.6* 7.5*  --  7.1*  HCT 25.1* 24.5*  --  23.1*  PLT 208 241  --  270  HEPARINUNFRC  --  <0.10* 0.19* 0.28*  CREATININE 1.43* 1.40*  --  1.38*     Estimated Creatinine Clearance: 44.3 mL/min (A) (by C-G formula based on SCr of 1.38 mg/dL (H)).  Assessment: 80 y.o. male with DVT for heparin  Goal of Therapy:  Heparin level 0.3-0.5 units/ml Monitor platelets by anticoagulation protocol: Yes   Plan:  Increase Heparin 1450 units/hr  Phillis Knack, PharmD, BCPS  03/31/2021

## 2021-03-31 NOTE — Progress Notes (Signed)
ANTICOAGULATION CONSULT NOTE  Pharmacy Consult for heparin Indication:  VTE treatment  No Known Allergies  Patient Measurements: Weight: 75.9 kg (167 lb 5.3 oz) Heparin Dosing Weight: 75kg  Vital Signs: Temp: 97.8 F (36.6 C) (08/13 2000) Temp Source: Oral (08/13 2000) BP: 93/62 (08/13 2000) Pulse Rate: 80 (08/13 2000)  Labs: Recent Labs    03/29/21 0022 03/30/21 0305 03/30/21 1636 03/31/21 0201 03/31/21 1354 03/31/21 2200  HGB 7.6* 7.5*  --  7.1* 7.4*  --   HCT 25.1* 24.5*  --  23.1* 23.5*  --   PLT 208 241  --  270  --   --   HEPARINUNFRC  --  <0.10*   < > 0.28* 0.45 0.40  CREATININE 1.43* 1.40*  --  1.38*  --   --    < > = values in this interval not displayed.     Estimated Creatinine Clearance: 44.3 mL/min (A) (by C-G formula based on SCr of 1.38 mg/dL (H)).  Assessment: 80 y.o. male with DVT for heparin  Goal of Therapy:  Heparin level 0.3-0.5 units/ml Monitor platelets by anticoagulation protocol: Yes   Plan:  Continue Heparin at current rate   Phillis Knack, PharmD, BCPS  03/31/2021

## 2021-03-31 NOTE — Plan of Care (Signed)

## 2021-03-31 NOTE — Progress Notes (Addendum)
ANTICOAGULATION CONSULT NOTE - Follow Up Consult  Pharmacy Consult for heparin  Indication:  VTE treatment  No Known Allergies  Patient Measurements: Weight: 75.9 kg (167 lb 5.3 oz) Heparin Dosing Weight: 75kg  Vital Signs: Temp: 97.8 F (36.6 C) (08/13 0730) Temp Source: Oral (08/13 0730) BP: 92/56 (08/13 0730) Pulse Rate: 76 (08/13 0730)  Labs: Recent Labs    03/29/21 0022 03/30/21 0305 03/30/21 1636 03/31/21 0201  HGB 7.6* 7.5*  --  7.1*  HCT 25.1* 24.5*  --  23.1*  PLT 208 241  --  270  HEPARINUNFRC  --  <0.10* 0.19* 0.28*  CREATININE 1.43* 1.40*  --  1.38*    Estimated Creatinine Clearance: 44.3 mL/min (A) (by C-G formula based on SCr of 1.38 mg/dL (H)).   Medications:  Scheduled:   (feeding supplement) PROSource Plus  30 mL Oral TID BM   acetaminophen  650 mg Oral BID   Chlorhexidine Gluconate Cloth  6 each Topical Daily   collagenase   Topical Daily   darbepoetin (ARANESP) injection - NON-DIALYSIS  40 mcg Subcutaneous Q Thu-1800   ferrous sulfate  325 mg Oral Q breakfast   metoprolol tartrate  25 mg Oral BID   multivitamin with minerals  1 tablet Oral Daily   pantoprazole  40 mg Oral Q1200   senna-docusate  2 tablet Oral BID   sodium chloride flush  10-40 mL Intracatheter Q12H   thiamine injection  100 mg Intravenous Daily   Or   thiamine  100 mg Oral Daily   vitamin B-12  1,000 mcg Oral Daily   Infusions:   sodium chloride      ceFAZolin (ANCEF) IV 2 g (03/31/21 0523)   heparin 1,450 Units/hr (03/31/21 0316)    Assessment: 80 year old male on PTA Eliquis for 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 New DVT discovered 8/11 and heparin drip was started. Hgb stable at 7.1. No documented signs of bleeding.  Heparin level 8/13 was 0.28 after increasing the dose from 1200 units/hour to 1350 units/hour. Dose was then increased to 1450 units/hour, resulting in a heparin level of 0.45 (drawn ~11 hours after rate change).    Goal of Therapy:  Heparin level 0.3-0.5 units/ml, no bolus Monitor platelets by anticoagulation protocol: Yes   Plan:  Continue current heparin rate  Check heparin level at 2200 today and then daily Continue to monitor CBC  Donald Pore 03/31/2021,7:32 AM

## 2021-03-31 NOTE — Progress Notes (Addendum)
PROGRESS NOTE    Scott Mcdowell [facility]  BTD:[id number] DOB: July 19, 1941 DOA: 03/20/2021 PCP: [name], MD    No chief complaint on file.   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  He has been hospitalized at [facility] multiple times in the last few months.  His right ankle pressure wound has been getting progressively worse with some drainage. In the ED patient was noted to be hypotensive with blood pressures in the 70-80s, hemoglobin was 5.4, creatinine was 2.5.   No history of overt blood loss. 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 -Blood Cx w/ MSSA Bacteremia-secondary to right ankle osteomyelitis, now on Ancef -Palliative following  Assessment & Plan:   Principal Problem:   Acute on chronic anemia Active Problems:   ARF (acute renal failure) (HCC)   Decubitus ulcer of right ankle, stage 4 (HCC) - present on admission   CKD (chronic kidney disease), stage IV (HCC)   GIB (gastrointestinal bleeding)   Multiple myeloma not having achieved remission (HCC)   Anemia in chronic kidney disease   Hypotension due to hypovolemia   Dysphagia   Expressive aphasia   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 (HCC)   SIRS (systemic inflammatory response syndrome) (HCC)   Staphylococcus aureus bacteremia  Severe Sepsis MSSA bacteremia Right ankle wound, right ankle osteomyelitis, pressure wound -Continue IV Ancef - MSSA + 8/2 - repeat cultures 8/4 negative -Appreciate infectious disease input, recommended line holiday and port removal - Port removed 8/6 by IR -Continue wound  care -Multiple discussions with family suggesting palliative, hospice and comfort focused care as most appropriate in this patient by previous MD -Palliative team following, continue full scope  -IR placed PICC 1/06, now complicated by DVT, currently functioning, will plan for anticoagulation for now - discussed with IR, planning for removal and placement of tunneled line -ID recommends 6 weeks of IV Ancef.  No recommendation for echo in this setting (echo previously done on 8/3 - EF 45-50%, RWMA, see report).    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  1-39% R ICA stenosis.   -Suspect hypotension is multifactorial, likely dehydration, blood loss and infection contributing, blood pressure more stable now -Eliquis on hold in the setting of severe anemia - follow for consideration of resumption (if he does well on heparin as noted below) -Appreciate neurology input -> noted L hemispheric watershed infarct in setting of hypoperfusion from MSSA bacteremia with underlying LICA chronic occlussion - recommended palliative care, no recommendation for further aggressive neurovascular workup - neurology recommended maintaining SBP >140 -PT OT, SLP evaluations completed, home therapy recommended, he is total care at baseline  Acute DVT Involving Right Axillary Vein  Acute Superficial Vein Thrombosis involving R Basilic Vein  Catheter Associated DVT - follow on heparin gtt, if stable on this,  will plan for doac - PICC is functioning, will maintain at this time - follow RUE swelling while on anticoagulation, monitor for need to remove pICC - given need for several weeks of antibiotics, discussed case with IR who noted it'd be reasonable to exchange and place tunneled line  Acute on chronic anemia -Suspect this is multifactorial, has underlying multiple myeloma, chronic kidney disease, no history of melena or hematochezia however he was heme positive. - watch hb closely in setting of starting anticoagulation- stable -Transfused 2 units of PRBC, anemia panel suggestive of chronic disease, no iron deficiency -He is not Maela Takeda candidate for endoscopic evaluation in the setting with acute CVA, bacteremia etc. -Hemoglobin is stable now, no evidence of active bleeding, -Changed PPI to p.o   Hypotension -See discussion above, likely multifactorial, secondary to bacteremia, severe anemia, dehydration likely contributing, antibiotics as above -Blood pressure stable now - neurology recommending maintaining SBP >140 due to risk of stroke in setting of hypoperfusion   Mildly Reduced EF  Regional Wall Motion Abnormalities Appears new, continue to follow Will discuss with family to plan follow up  CKD 4 -Baseline creatinine around 1.5, worse in the setting of severe anemia, hypotension etc. -Now stable, IV fluids discontinued   Dysphagia -SLP evaluation ongoing, continue dysphagia diet   Multiple myeloma -On Revlimid at baseline, this is on hold in the setting of bacteremia/infection   Hypokalemia -Replaced   Severe protein calorie malnutrition -Add supplements as tolerated   Ethics -This is Scott Mcdowell chronically ill elderly male who is bedbound, right hemiplegia, pressure wounds with significant functional decline over the last 6 months to Scott Mcdowell year, multiple myeloma, H/o CVA w/ Hemiplegia, CKD, now with acute CVA, bacteremia, severe anemia, protein calorie malnutrition. -Prior hospitalist  discussed overall poor prognosis with patient's daughter and spouse, recommended DNR and symptom/comfort focused care -Palliative consult appreciated, family requests full scope of care  DVT prophylaxis: heparin gtt Code Status: full  Family Communication: discussed with wife over phone 8/13 Disposition:   Status is: Inpatient  Remains inpatient appropriate because:Inpatient level of care appropriate due to severity of illness  Dispo: The patient is from: Home              Anticipated d/c is to: Home              Patient currently is not medically stable to d/c.   Difficult to place patient No       Consultants:  neurology  Procedures:   Summary:     Right:  Unable to assess Scott Mcdowell large portion of the venous system due to the patient's  positioning, and pain tolerance. Visualized portions appeared patent and  compressible.     Left:  No evidence of thrombosis in the subclavian.     Echo IMPRESSIONS     1. Left ventricular ejection fraction, by estimation, is 45 to 50%. The  left ventricle has mildly decreased function. The left ventricle  demonstrates regional wall motion abnormalities (see scoring  diagram/findings for description). There is mild left  ventricular hypertrophy. Left ventricular diastolic parameters are  indeterminate.   2. Right ventricular systolic function is normal. The right ventricular  size is normal.   3. Left atrial size was mildly dilated.   4. The mitral valve is normal in structure. Moderate mitral valve  regurgitation. No evidence of mitral stenosis.   5. Tricuspid valve regurgitation is moderate.   6.  The aortic valve is tricuspid. There is moderate calcification of the  aortic valve. There is moderate thickening of the aortic valve. Aortic  valve regurgitation is not visualized. Mild to moderate aortic valve  sclerosis/calcification is present,  without any evidence of aortic stenosis.   7. The inferior vena cava is normal in size  with greater than 50%  respiratory variability, suggesting right atrial pressure of 3 mmHg.   Conclusion(s)/Recommendation(s): No intracardiac source of embolism  detected on this transthoracic study. Scott Mcdowell transesophageal echocardiogram is  recommended to exclude cardiac source of embolism if clinically indicated.   Carotid US Summary:  Right Carotid: Velocities in the right ICA are consistent with Scott Mcdowell 1-39%  stenosis.                 Non-hemodynamically significant plaque <50% noted in the  CCA. The                 ECA appears <50% stenosed.   Left Carotid: Velocities in the left ICA are consistent with Scott Mcdowell 80-99%  stenosis.                Non-hemodynamically significant plaque <50% noted in the  CCA. The                ECA appears <50% stenosed.   Vertebrals:  Right vertebral artery demonstrates antegrade flow. Left  vertebral               artery demonstrates high resistant flow with loss of  diastolic               flow.  Subclavians: Normal flow hemodynamics were seen in the right subclavian  artery.               Abnormal flow seen in left subclavian artery (LUE AVF).  Antimicrobials: Anti-infectives (From admission, onward)    Start     Dose/Rate Route Frequency Ordered Stop   03/24/21 2200  ceFAZolin (ANCEF) IVPB 2g/100 mL premix        2 g 200 mL/hr over 30 Minutes Intravenous Every 8 hours 03/24/21 1522     03/22/21 0000  metroNIDAZOLE (FLAGYL) IVPB 500 mg  Status:  Discontinued        500 mg 100 mL/hr over 60 Minutes Intravenous Every 12 hours 03/21/21 1214 03/25/21 1228   03/21/21 2200  ceFAZolin (ANCEF) IVPB 2g/100 mL premix  Status:  Discontinued        2 g 200 mL/hr over 30 Minutes Intravenous Every 12 hours 03/21/21 1214 03/24/21 1522   03/21/21 0230  piperacillin-tazobactam (ZOSYN) IVPB 3.375 g  Status:  Discontinued        3.375 g 12.5 mL/hr over 240 Minutes Intravenous Every 8 hours 03/20/21 2106 03/21/21 1214   03/20/21 2200  piperacillin-tazobactam (ZOSYN)  IVPB 3.375 g  Status:  Discontinued        3.375 g 100 mL/hr over 30 Minutes Intravenous Every 8 hours 03/20/21 2104 03/20/21 2106   03/20/21 2100  vancomycin variable dose per unstable renal function (pharmacist dosing)  Status:  Discontinued         Does not apply See admin instructions 03/20/21 2100 03/21/21 1214   03/20/21 1530  piperacillin-tazobactam (ZOSYN) IVPB 3.375 g        3.375 g 100 mL/hr over 30 Minutes Intravenous  Once 03/20/21 1515 03/20/21 1922   03/20/21 1530  vancomycin (VANCOREADY) IVPB 1500 mg/300 mL        1,500 mg 150  mL/hr over 120 Minutes Intravenous  Once 03/20/21 1515 03/20/21 1835          Subjective: No complaints, asks me to change channel  Objective: Vitals:   03/31/21 0324 03/31/21 0730 03/31/21 1156 03/31/21 1646  BP: 105/89 (!) 92/56 (!) 87/45 101/60  Pulse: 82 76 65 78  Resp: _0 Temp: 97.6 F (36.4 C) 97.8 F (36.6 C) 97.9 F (36.6 C) 97.7 F (36.5 C)  TempSrc: Oral Oral Oral Oral  SpO2: 94% 97% 96% 98%  Weight:        Intake/Output Summary (Last 24 hours) at 03/31/2021 1836 Last data filed at 03/31/2021 1000 Gross per 24 hour  Intake 240 ml  Output 300 ml  Net -60 ml   Filed Weights   03/20/21 1500  Weight: 75.9 kg    Examination:  General: No acute distress. Cardiovascular: RRR Lungs: Clear to auscultation bilaterally Abdomen: Soft, nontender, nondistended Neurological: R sdied weakness, aphasia, able to get some words out with time appropriately Extremities: dressing intact to lower extremities, right upper extremity swelling   Data Reviewed: I have personally reviewed following labs and imaging studies  CBC: Recent Labs  Lab 03/27/21 0220 03/28/21 0220 03/29/21 0022 03/30/21 0305 03/31/21 0201 03/31/21 1354  WBC 3.6* 4.0 4.4 4.7 4.9  --   NEUTROABS  --   --  3.3 3.6 3.8  --   HGB 8.2* 7.9* 7.6* 7.5* 7.1* 7.4*  HCT 27.0* 26.0* 25.1* 24.5* 23.1* 23.5*  MCV 97.8 97.4 98.8 97.6 97.1  --   PLT 173 203  208 241 270  --     Basic Metabolic Panel: Recent Labs  Lab 03/25/21 0631 03/26/21 0225 03/27/21 0220 03/28/21 0220 03/29/21 0022 03/30/21 0305 03/31/21 0201  NA 139   < > 134* 135 134* 135 133*  K 3.8   < > 4.2 4.6 4.2 3.8 3.7  CL 108   < > 106 105 104 106 105  CO2 23   < > _1 GLUCOSE 113*   < > 81 77 90 91 89  BUN 33*   < > 26* 26* 25* 22 24*  CREATININE 1.63*   < > 1.37* 1.44* 1.43* 1.40* 1.38*  CALCIUM 7.8*   < > 7.7* 7.9* 7.7* 7.7* 7.6*  MG 2.4  --   --   --  2.3 2.2 2.0  PHOS  --   --   --   --  3.0 2.8 3.1   < > = values in this interval not displayed.    GFR: Estimated Creatinine Clearance: 44.3 mL/min (Scott Mcdowell) (by C-G formula based on SCr of 1.38 mg/dL (H)).  Liver Function Tests: Recent Labs  Lab 03/29/21 0022 03/30/21 0305 03/31/21 0201  AST _2 ALT 6 <5 <5  ALKPHOS 59 65 61  BILITOT 0.3 0.5 0.4  PROT 4.4* 4.2* 4.3*  ALBUMIN 1.5* 1.4* 1.4*    CBG: No results for input(s): GLUCAP in the last 168 hours.   Recent Results (from the past 240 hour(s))  Culture, blood (routine x 2)     Status: None   Collection Time: 03/22/21  8:34 AM   Specimen: BLOOD RIGHT HAND  Result Value Ref Range Status   Specimen Description BLOOD RIGHT HAND  Final   Special Requests   Final    BOTTLES DRAWN AEROBIC AND ANAEROBIC Blood Culture adequate volume   Culture   Final    NO GROWTH 5  DAYS Performed at [facility], [place] [address], [place], [place] [address]    Report Status 03/27/2021 FINAL  Final  Culture, blood (routine x 2)     Status: None   Collection Time: 03/22/21  8:42 AM   Specimen: BLOOD RIGHT HAND  Result Value Ref Range Status   Specimen Description BLOOD RIGHT HAND  Final   Special Requests   Final    BOTTLES DRAWN AEROBIC AND ANAEROBIC Blood Culture results may not be optimal due to an inadequate volume of blood received in culture bottles   Culture   Final    NO GROWTH 5 DAYS Performed at [facility], [place] [address], [place], [place] [address]    Report Status 03/27/2021 FINAL  Final         Radiology Studies: No results found.      Scheduled Meds:  (feeding supplement) PROSource Plus  30 mL Oral TID BM   acetaminophen  650 mg Oral BID   Chlorhexidine Gluconate Cloth  6 each Topical Daily   collagenase   Topical Daily   darbepoetin (ARANESP) injection - NON-DIALYSIS  40 mcg Subcutaneous Q Thu-1800   ferrous sulfate  325 mg Oral Q breakfast   metoprolol tartrate  25 mg Oral BID   multivitamin with minerals  1 tablet Oral Daily   pantoprazole  40 mg Oral Q1200   senna-docusate  2 tablet Oral BID   sodium chloride flush  10-40 mL Intracatheter Q12H   thiamine injection  100 mg Intravenous Daily   Or   thiamine  100 mg Oral Daily   vitamin B-12  1,000 mcg Oral Daily   Continuous Infusions:  sodium chloride      ceFAZolin (ANCEF) IV 2 g (03/31/21 1634)   heparin 1,450 Units/hr (03/31/21 0951)     LOS: 11 days    Time spent: over 30 min    Fayrene Helper, MD [facility]   To contact the attending provider between 7A-7P or the covering provider during after hours 7P-7A, please log into the web site [email or web address] and access using universal [place] password for that web site. If you do not have the password, please call the hospital operator.  03/31/2021, 6:36 PM

## 2021-04-01 ENCOUNTER — Inpatient Hospital Stay (HOSPITAL_COMMUNITY): Payer: Medicare Other

## 2021-04-01 HISTORY — PX: IR FLUORO GUIDE CV LINE RIGHT: IMG2283

## 2021-04-01 HISTORY — PX: IR PERC TUN PERIT CATH WO PORT S&I /IMAG: IMG2327

## 2021-04-01 HISTORY — PX: IR US GUIDE VASC ACCESS RIGHT: IMG2390

## 2021-04-01 LAB — CBC WITH DIFFERENTIAL/PLATELET
Abs Immature Granulocytes: 0.04 10*3/uL (ref 0.00–0.07)
Basophils Absolute: 0 10*3/uL (ref 0.0–0.1)
Basophils Relative: 0 %
Eosinophils Absolute: 0.1 10*3/uL (ref 0.0–0.5)
Eosinophils Relative: 2 %
HCT: 23 % — ABNORMAL LOW (ref 39.0–52.0)
Hemoglobin: 7.1 g/dL — ABNORMAL LOW (ref 13.0–17.0)
Immature Granulocytes: 1 %
Lymphocytes Relative: 10 %
Lymphs Abs: 0.5 10*3/uL — ABNORMAL LOW (ref 0.7–4.0)
MCH: 30 pg (ref 26.0–34.0)
MCHC: 30.9 g/dL (ref 30.0–36.0)
MCV: 97 fL (ref 80.0–100.0)
Monocytes Absolute: 0.4 10*3/uL (ref 0.1–1.0)
Monocytes Relative: 7 %
Neutro Abs: 4.1 10*3/uL (ref 1.7–7.7)
Neutrophils Relative %: 80 %
Platelets: 301 10*3/uL (ref 150–400)
RBC: 2.37 MIL/uL — ABNORMAL LOW (ref 4.22–5.81)
RDW: 17.3 % — ABNORMAL HIGH (ref 11.5–15.5)
WBC: 5.2 10*3/uL (ref 4.0–10.5)
nRBC: 0 % (ref 0.0–0.2)

## 2021-04-01 LAB — COMPREHENSIVE METABOLIC PANEL
ALT: <5 U/L (ref 0–44)
AST: 21 U/L (ref 15–41)
Albumin: 1.4 g/dL — ABNORMAL LOW (ref 3.5–5.0)
Alkaline Phosphatase: 77 U/L (ref 38–126)
Anion gap: 7 (ref 5–15)
BUN: 28 mg/dL — ABNORMAL HIGH (ref 8–23)
CO2: 22 mmol/L (ref 22–32)
Calcium: 7.7 mg/dL — ABNORMAL LOW (ref 8.9–10.3)
Chloride: 106 mmol/L (ref 98–111)
Creatinine, Ser: 1.36 mg/dL — ABNORMAL HIGH (ref 0.61–1.24)
GFR, Estimated: 52 mL/min — ABNORMAL LOW (ref >60)
Glucose, Bld: 122 mg/dL — ABNORMAL HIGH (ref 70–99)
Potassium: 3.6 mmol/L (ref 3.5–5.1)
Sodium: 135 mmol/L (ref 135–145)
Total Bilirubin: 0.2 mg/dL — ABNORMAL LOW (ref 0.3–1.2)
Total Protein: 4.4 g/dL — ABNORMAL LOW (ref 6.5–8.1)

## 2021-04-01 LAB — PHOSPHORUS: Phosphorus: 2.9 mg/dL (ref 2.5–4.6)

## 2021-04-01 LAB — MAGNESIUM: Magnesium: 2 mg/dL (ref 1.7–2.4)

## 2021-04-01 LAB — HEPARIN LEVEL (UNFRACTIONATED): Heparin Unfractionated: 0.29 IU/mL — ABNORMAL LOW (ref 0.30–0.70)

## 2021-04-01 MED ORDER — CHLORHEXIDINE GLUCONATE 4 % EX LIQD
CUTANEOUS | Status: AC
  Filled 2021-04-01: qty 15

## 2021-04-01 MED ORDER — LIDOCAINE HCL 1 % IJ SOLN
INTRAMUSCULAR | Status: AC
  Filled 2021-04-01: qty 20

## 2021-04-01 NOTE — Progress Notes (Signed)
Palliative:  HPI: 80 y.o. 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 Workup reveals sepsis r/t MSSA bacteremia presumed from R ankle wound, anemia with Hgb 5.7, new watershed infarct, fecal occult positive. Infectious disease recommends removing PAC, 4 weeks of antibiotics, TEE. SLP consulted and he is tolerating dysphagia 3 diet. Palliative medicine consulted for [name] discussion due to multiple life limiting chronic illnesses.   [name] is sleeping today. No family at bedside. Ate ~25% of breakfast but intake was better yesterday and seems to improve throughout the day per NT. No changes or complications.   I called and spoke again with wife, Scott Mcdowell. Scott Mcdowell has many questions regarding discharge plans potentially for tomorrow and we discussed that this does seem possible if everything can be put in place for care to be provided at home. She notes that he will need transport home. We spent time discussing plans for home and goals of care. Family continue to discuss potential DNR status and Scott Mcdowell notes that she thought they had come to a conclusion but have not yet (sounds like they are still trying to get all family on board for DNR which seems to be [name]'s wish for him but she wants family to agree). I recommended outpatient palliative care to follow at home and Scott Mcdowell had this confused with hospice and I clarified that palliative care will not limit his care options as hospice would be would be a supportive care to continue these conversations and she agrees with outpatient palliative to follow at home.   All questions/concerns addressed. Emotional support provided.   Exam: Sleeping comfortably. No distress. Breathing regular, unlabored. Abd  soft. Warm to touch. Bed bound.   Plan: - Family continue to discuss potential DNR.  - Outpatient palliative to follow.   [place], NP Palliative Medicine Team Pager [phone] (Please see [email or web address] for schedule) Team Phone [phone]    Greater than 50%  of this time was spent counseling and coordinating care related to the above assessment and plan

## 2021-04-01 NOTE — Procedures (Addendum)
Interventional Radiology Procedure Note  Procedure:   Image guided right IJ tunneled DL central catheter.  Removal of right PICC, secondary to cath-associated thrombosis  Complications: None Recommendations:  - Ok to use - Do not submerge - Routine line care  - Care with skin, given friability - ok to continue [facility]  Signed,  Dulcy Fanny. Earleen Newport, DO

## 2021-04-01 NOTE — Progress Notes (Signed)
ANTICOAGULATION CONSULT NOTE - Follow Up Consult  Pharmacy Consult for heparin  Indication:  VTE treatment  No Known Allergies  Patient Measurements: Weight: 77 kg (169 lb 12.1 oz) Heparin Dosing Weight: 75kg  Vital Signs: Temp: 99 F (37.2 C) (08/14 0458) Temp Source: Oral (08/14 0749) BP: 120/51 (08/14 0749) Pulse Rate: 74 (08/14 0749)  Labs: Recent Labs    03/30/21 0305 03/30/21 1636 03/31/21 0201 03/31/21 1354 03/31/21 2200 04/01/21 0438  HGB 7.5*  --  7.1* 7.4*  --  7.1*  HCT 24.5*  --  23.1* 23.5*  --  23.0*  PLT 241  --  270  --   --  301  HEPARINUNFRC <0.10*   < > 0.28* 0.45 0.40 0.29*  CREATININE 1.40*  --  1.38*  --   --  1.36*   < > = values in this interval not displayed.     Estimated Creatinine Clearance: 45.6 mL/min (A) (by C-G formula based on SCr of 1.36 mg/dL (H)).   Medications:  Scheduled:   (feeding supplement) PROSource Plus  30 mL Oral TID BM   acetaminophen  650 mg Oral BID   Chlorhexidine Gluconate Cloth  6 each Topical Daily   collagenase   Topical Daily   darbepoetin (ARANESP) injection - NON-DIALYSIS  40 mcg Subcutaneous Q Thu-1800   ferrous sulfate  325 mg Oral Q breakfast   metoprolol tartrate  25 mg Oral BID   multivitamin with minerals  1 tablet Oral Daily   pantoprazole  40 mg Oral Q1200   senna-docusate  2 tablet Oral BID   sodium chloride flush  10-40 mL Intracatheter Q12H   thiamine injection  100 mg Intravenous Daily   Or   thiamine  100 mg Oral Daily   vitamin B-12  1,000 mcg Oral Daily   Infusions:   sodium chloride      ceFAZolin (ANCEF) IV 2 g (04/01/21 0517)   heparin 1,450 Units/hr (04/01/21 0321)    Assessment: 80 year old male on PTA Eliquis for 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 New DVT discovered 8/11 and heparin drip was started. Hgb stable at 7.1. No documented signs of bleeding.  Heparin level 8/14 AM was slightly subtherapeutic at 0.29, decreased from previous  level. Since close to goal, increase infusion rate slightly and check level tomorrow morning.   Goal of Therapy:  Heparin level 0.3-0.5 units/ml, no bolus Monitor platelets by anticoagulation protocol: Yes   Plan:  Increase heparin infusion rate to 1500 units/hour Check heparin level daily Continue to monitor CBC  Donald Pore 04/01/2021,7:51 AM

## 2021-04-01 NOTE — Progress Notes (Signed)
PROGRESS NOTE    [facility]  CVE:[id number] DOB: [birth year] DOA: 03/20/2021 PCP: [name], MD    No chief complaint on file.   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  He has been hospitalized at [facility] multiple times in the last few months.  His right ankle pressure wound has been getting progressively worse with some drainage. In the ED patient was noted to be hypotensive with blood pressures in the 70-80s, hemoglobin was 5.4, creatinine was 2.5.   No history of overt blood loss. 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 -Blood Cx w/ MSSA Bacteremia-secondary to right ankle osteomyelitis, now on Ancef -Palliative following  Assessment & Plan:   Principal Problem:   Acute on chronic anemia Active Problems:   ARF (acute renal failure) (HCC)   Decubitus ulcer of right ankle, stage 4 (HCC) - present on admission   CKD (chronic kidney disease), stage IV (HCC)   GIB (gastrointestinal bleeding)   Multiple myeloma not having achieved remission (HCC)   Anemia in chronic kidney disease   Hypotension due to hypovolemia   Dysphagia   Expressive aphasia   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 (HCC)   SIRS (systemic inflammatory response syndrome) (HCC)   Staphylococcus aureus bacteremia  Severe Sepsis MSSA bacteremia Right ankle wound, right ankle osteomyelitis, pressure wound -Continue IV Ancef - MSSA + 8/2 - repeat cultures 8/4 negative -Appreciate infectious disease input, recommended line holiday and port removal - Port removed 8/6 by IR -Continue wound  care -Multiple discussions with family suggesting palliative, hospice and comfort focused care as most appropriate in this patient by previous MD -Palliative team following, continue full scope  -IR placed PICC 0/25, now complicated by DVT -PICC removed 8/14, tunneled catheter placed -ID recommends 6 weeks of IV Ancef.  No recommendation for echo in this setting (echo previously done on 8/3 - EF 45-50%, RWMA, see report).    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  1-39% R ICA stenosis.   -Suspect hypotension is multifactorial, likely dehydration, blood loss and infection contributing, blood pressure more stable now -Eliquis on hold in the setting of severe anemia - follow for consideration of resumption (if he does well on heparin as noted below) -Appreciate neurology input -> noted L hemispheric watershed infarct in setting of hypoperfusion from MSSA bacteremia with underlying LICA chronic occlussion - recommended palliative care, no recommendation for further aggressive neurovascular workup - neurology recommended maintaining SBP >140 -PT OT, SLP evaluations completed, home therapy recommended, he is total care at baseline  Acute DVT Involving Right Axillary Vein  Acute Superficial Vein Thrombosis involving R Basilic Vein  Catheter Associated DVT - follow on heparin gtt, if stable on this, will  plan for doac - PICC is functioning, will maintain at this time - follow RUE swelling  while on anticoagulation, monitor for need to remove pICC - given need for several weeks of antibiotics, discussed case with IR who noted it'd be reasonable to exchange and place tunneled line  Acute on chronic anemia -Suspect this is multifactorial, has underlying multiple myeloma, chronic kidney disease, no history of melena or hematochezia however he was heme positive. - watch hb closely in setting of starting anticoagulation- stable -Transfused 2 units of PRBC, anemia panel suggestive of chronic disease, no iron deficiency -He is not Cesia Orf candidate for endoscopic evaluation in the setting with acute CVA, bacteremia etc. -Hemoglobin is stable now, no evidence of active bleeding, -Changed PPI to p.o   Hypotension -See discussion above, likely multifactorial, secondary to bacteremia, severe anemia, dehydration likely contributing, antibiotics as above -Blood pressure stable now - neurology recommending maintaining SBP >140 due to risk of stroke in setting of hypoperfusion   Mildly Reduced EF  Regional Wall Motion Abnormalities Appears new, continue to follow Will discuss with family to plan follow up  CKD 4 -Baseline creatinine around 1.5, worse in the setting of severe anemia, hypotension etc. -Now stable, IV fluids discontinued   Dysphagia -SLP evaluation ongoing, continue dysphagia diet   Multiple myeloma -On Revlimid at baseline, this is on hold in the setting of bacteremia/infection   Hypokalemia -Replaced   Severe protein calorie malnutrition -Add supplements as tolerated   Ethics -This is Adalberto Metzgar chronically ill elderly male who is bedbound, right hemiplegia, pressure wounds with significant functional decline over the last 6 months to Davi Rotan year, multiple myeloma, H/o CVA w/ Hemiplegia, CKD, now with acute CVA, bacteremia, severe anemia, protein calorie malnutrition. -Prior hospitalist discussed overall poor prognosis with patient's daughter and spouse, recommended DNR and  symptom/comfort focused care -Palliative consult appreciated, family requests full scope of care  DVT prophylaxis: heparin gtt Code Status: full  Family Communication: discussed with wife over phone 8/13 Disposition:   Status is: Inpatient  Remains inpatient appropriate because:Inpatient level of care appropriate due to severity of illness  Dispo: The patient is from: Home              Anticipated d/c is to: Home              Patient currently is not medically stable to d/c.   Difficult to place patient No       Consultants:  neurology  Procedures:   Summary:     Right:  Unable to assess Darcel Frane large portion of the venous system due to the patient's  positioning, and pain tolerance. Visualized portions appeared patent and  compressible.     Left:  No evidence of thrombosis in the subclavian.     Echo IMPRESSIONS     1. Left ventricular ejection fraction, by estimation, is 45 to 50%. The  left ventricle has mildly decreased function. The left ventricle  demonstrates regional wall motion abnormalities (see scoring  diagram/findings for description). There is mild left  ventricular hypertrophy. Left ventricular diastolic parameters are  indeterminate.   2. Right ventricular systolic function is normal. The right ventricular  size is normal.   3. Left atrial size was mildly dilated.   4. The mitral valve is normal in structure. Moderate mitral valve  regurgitation. No evidence of mitral stenosis.   5. Tricuspid valve regurgitation is moderate.   6. The aortic valve is tricuspid. There is moderate calcification of the  aortic  valve. There is moderate thickening of the aortic valve. Aortic  valve regurgitation is not visualized. Mild to moderate aortic valve  sclerosis/calcification is present,  without any evidence of aortic stenosis.   7. The inferior vena cava is normal in size with greater than 50%  respiratory variability, suggesting right atrial pressure of 3  mmHg.   Conclusion(s)/Recommendation(s): No intracardiac source of embolism  detected on this transthoracic study. Annitta Fifield transesophageal echocardiogram is  recommended to exclude cardiac source of embolism if clinically indicated.   Carotid US Summary:  Right Carotid: Velocities in the right ICA are consistent with Marriana Hibberd 1-39%  stenosis.                 Non-hemodynamically significant plaque <50% noted in the  CCA. The                 ECA appears <50% stenosed.   Left Carotid: Velocities in the left ICA are consistent with Latara Micheli 80-99%  stenosis.                Non-hemodynamically significant plaque <50% noted in the  CCA. The                ECA appears <50% stenosed.   Vertebrals:  Right vertebral artery demonstrates antegrade flow. Left  vertebral               artery demonstrates high resistant flow with loss of  diastolic               flow.  Subclavians: Normal flow hemodynamics were seen in the right subclavian  artery.               Abnormal flow seen in left subclavian artery (LUE AVF).    Procedure:    Image guided right IJ tunneled DL central catheter.   Removal of right PICC, secondary to cath-associated thrombosis   Complications: None Recommendations:  - Ok to use - Do not submerge - Routine line care  - Care with skin, given friability - ok to continue [facility]  Antimicrobials: Anti-infectives (From admission, onward)    Start     Dose/Rate Route Frequency Ordered Stop   03/24/21 2200  ceFAZolin (ANCEF) IVPB 2g/100 mL premix        2 g 200 mL/hr over 30 Minutes Intravenous Every 8 hours 03/24/21 1522     03/22/21 0000  metroNIDAZOLE (FLAGYL) IVPB 500 mg  Status:  Discontinued        500 mg 100 mL/hr over 60 Minutes Intravenous Every 12 hours 03/21/21 1214 03/25/21 1228   03/21/21 2200  ceFAZolin (ANCEF) IVPB 2g/100 mL premix  Status:  Discontinued        2 g 200 mL/hr over 30 Minutes Intravenous Every 12 hours 03/21/21 1214 03/24/21 1522   03/21/21 0230   piperacillin-tazobactam (ZOSYN) IVPB 3.375 g  Status:  Discontinued        3.375 g 12.5 mL/hr over 240 Minutes Intravenous Every 8 hours 03/20/21 2106 03/21/21 1214   03/20/21 2200  piperacillin-tazobactam (ZOSYN) IVPB 3.375 g  Status:  Discontinued        3.375 g 100 mL/hr over 30 Minutes Intravenous Every 8 hours 03/20/21 2104 03/20/21 2106   03/20/21 2100  vancomycin variable dose per unstable renal function (pharmacist dosing)  Status:  Discontinued         Does not apply See admin instructions 03/20/21 2100 03/21/21 1214   03/20/21 1530  piperacillin-tazobactam (ZOSYN) IVPB 3.375 g  3.375 g 100 mL/hr over 30 Minutes Intravenous  Once 03/20/21 1515 03/20/21 1922   03/20/21 1530  vancomycin (VANCOREADY) IVPB 1500 mg/300 mL        1,500 mg 150 mL/hr over 120 Minutes Intravenous  Once 03/20/21 1515 03/20/21 1835          Subjective: Difficult to understand Seems to deny complaints  Objective: Vitals:   04/01/21 0624 04/01/21 0749 04/01/21 1202 04/01/21 1617  BP: (!) 158/61 (!) 120/51 (!) 125/56 134/87  Pulse:  74 60 65  Resp:  '16 16 18  ' Temp:  97.6 F (36.4 C) 97.9 F (36.6 C) 98.2 F (36.8 C)  TempSrc:  Axillary Axillary Axillary  SpO2:  96% 100% 98%  Weight:        Intake/Output Summary (Last 24 hours) at 04/01/2021 1704 Last data filed at 04/01/2021 0845 Gross per 24 hour  Intake 1273.54 ml  Output 550 ml  Net 723.54 ml   Filed Weights   03/20/21 1500 04/01/21 0458  Weight: 75.9 kg 77 kg    Examination:  General: No acute distress. Cardiovascular: RRR R tunneled line Lungs: unlabored Abdomen: Soft, nontender, nondistended  Neurological: R sided weakness, aphasia, difficult to understand Extremities: RUE edema, bilateral LE with prafo boots, intact dressing    Data Reviewed: I have personally reviewed following labs and imaging studies  CBC: Recent Labs  Lab 03/28/21 0220 03/29/21 0022 03/30/21 0305 03/31/21 0201 03/31/21 1354  04/01/21 0438  WBC 4.0 4.4 4.7 4.9  --  5.2  NEUTROABS  --  3.3 3.6 3.8  --  4.1  HGB 7.9* 7.6* 7.5* 7.1* 7.4* 7.1*  HCT 26.0* 25.1* 24.5* 23.1* 23.5* 23.0*  MCV 97.4 98.8 97.6 97.1  --  97.0  PLT 203 208 241 270  --  518    Basic Metabolic Panel: Recent Labs  Lab 03/28/21 0220 03/29/21 0022 03/30/21 0305 03/31/21 0201 04/01/21 0438  NA 135 134* 135 133* 135  K 4.6 4.2 3.8 3.7 3.6  CL 105 104 106 105 106  CO2 '25 23 25 22 22  ' GLUCOSE 77 90 91 89 122*  BUN 26* 25* 22 24* 28*  CREATININE 1.44* 1.43* 1.40* 1.38* 1.36*  CALCIUM 7.9* 7.7* 7.7* 7.6* 7.7*  MG  --  2.3 2.2 2.0 2.0  PHOS  --  3.0 2.8 3.1 2.9    GFR: Estimated Creatinine Clearance: 45.6 mL/min (Jovi Alvizo) (by C-G formula based on SCr of 1.36 mg/dL (H)).  Liver Function Tests: Recent Labs  Lab 03/29/21 0022 03/30/21 0305 03/31/21 0201 04/01/21 0438  AST '18 16 20 21  ' ALT 6 <5 <5 <5  ALKPHOS 59 65 61 77  BILITOT 0.3 0.5 0.4 0.2*  PROT 4.4* 4.2* 4.3* 4.4*  ALBUMIN 1.5* 1.4* 1.4* 1.4*    CBG: No results for input(s): GLUCAP in the last 168 hours.   No results found for this or any previous visit (from the past 240 hour(s)).        Radiology Studies: IR TUNNELED CENTRAL VENOUS CATHETER PLACEMENT  Result Date: 04/01/2021 INDICATION: 80 year old male referred for removal of Derry Kassel right upper extremity PICC secondary to catheter associated thrombus, and placement of tunneled central venous catheter. The patient has Bliss Behnke left upper extremity dialysis fistula. EXAM: TUNNELED CENTRAL VENOUS CATHETER MEDICATIONS: None ANESTHESIA/SEDATION: None FLUOROSCOPY TIME:  Fluoroscopy Time: 2 minutes 0 seconds COMPLICATIONS: None PROCEDURE: After written informed consent was obtained, patient was placed in the supine position on angiographic table. Patency of the right internal jugular  vein was confirmed with ultrasound with image documentation. Patient was prepped and draped in the usual sterile fashion including the right neck and  right superior chest. Using ultrasound guidance, the skin and subcutaneous tissues overlying the right internal jugular vein were generously infiltrated with 1% lidocaine without epinephrine. Using ultrasound guidance, the right internal jugular vein was punctured with Maday Guarino micropuncture needle, and an 018 wire was advanced into the right heart confirming venous access. Falisa Lamora small stab incision was made with an 11 blade scalpel. Peel-away sheath was placed over the wire, and then the wire was removed, marking the wire for estimation of internal catheter length. The chest wall was then generously infiltrated with 1% lidocaine for local anesthesia along the tissue tract. Small stab incision was made with 11 blade scalpel, and then the catheter was back tunneled to the puncture site at the right internal jugular vein. Catheter was pulled through the tract, with the catheter amputated at 24 cm. Catheter was advanced through the peel-away sheath, and the peel-away sheath was removed. Final image was stored. The catheter was anchored to the chest wall with 2 retention sutures, and Derma bond was used to seal the right internal jugular vein incision site and at the right chest wall. Patient tolerated the procedure well and remained hemodynamically stable throughout. No complications were encountered and no significant blood loss was encountered. IMPRESSION: Status post right IJ tunneled cuffed central venous catheter. Signed, Dulcy Fanny. Dellia Nims, RPVI [facility] Radiology Electronically Signed   By: Corrie Mckusick D.O.   On: 04/01/2021 12:24        Scheduled Meds:  (feeding supplement) PROSource Plus  30 mL Oral TID BM   acetaminophen  650 mg Oral BID   chlorhexidine       Chlorhexidine Gluconate Cloth  6 each Topical Daily   collagenase   Topical Daily   darbepoetin (ARANESP) injection - NON-DIALYSIS  40 mcg Subcutaneous Q Thu-1800   ferrous sulfate  325 mg Oral Q  breakfast   metoprolol tartrate  25 mg Oral BID   multivitamin with minerals  1 tablet Oral Daily   pantoprazole  40 mg Oral Q1200   senna-docusate  2 tablet Oral BID   sodium chloride flush  10-40 mL Intracatheter Q12H   thiamine injection  100 mg Intravenous Daily   Or   thiamine  100 mg Oral Daily   vitamin B-12  1,000 mcg Oral Daily   Continuous Infusions:  sodium chloride      ceFAZolin (ANCEF) IV 2 g (04/01/21 1407)   heparin 1,500 Units/hr (04/01/21 0806)     LOS: 12 days    Time spent: over 30 min    Fayrene Helper, MD [facility]   To contact the attending provider between 7A-7P or the covering provider during after hours 7P-7A, please log into the web site [email or web address] and access using universal [place] password for that web site. If you do not have the password, please call the hospital operator.  04/01/2021, 5:04 PM

## 2021-04-01 NOTE — Plan of Care (Signed)

## 2021-04-02 ENCOUNTER — Encounter (HOSPITAL_COMMUNITY): Payer: Self-pay

## 2021-04-02 LAB — CBC
HCT: 22.8 % — ABNORMAL LOW (ref 39.0–52.0)
Hemoglobin: 7 g/dL — ABNORMAL LOW (ref 13.0–17.0)
MCH: 30.2 pg (ref 26.0–34.0)
MCHC: 30.7 g/dL (ref 30.0–36.0)
MCV: 98.3 fL (ref 80.0–100.0)
Platelets: 301 10*3/uL (ref 150–400)
RBC: 2.32 MIL/uL — ABNORMAL LOW (ref 4.22–5.81)
RDW: 17.7 % — ABNORMAL HIGH (ref 11.5–15.5)
WBC: 5.7 10*3/uL (ref 4.0–10.5)
nRBC: 0 % (ref 0.0–0.2)

## 2021-04-02 LAB — COMPREHENSIVE METABOLIC PANEL
ALT: <5 U/L (ref 0–44)
AST: 18 U/L (ref 15–41)
Albumin: 1.4 g/dL — ABNORMAL LOW (ref 3.5–5.0)
Alkaline Phosphatase: 60 U/L (ref 38–126)
Anion gap: 5 (ref 5–15)
BUN: 27 mg/dL — ABNORMAL HIGH (ref 8–23)
CO2: 24 mmol/L (ref 22–32)
Calcium: 7.8 mg/dL — ABNORMAL LOW (ref 8.9–10.3)
Chloride: 110 mmol/L (ref 98–111)
Creatinine, Ser: 1.2 mg/dL (ref 0.61–1.24)
GFR, Estimated: >60 mL/min (ref >60)
Glucose, Bld: 97 mg/dL (ref 70–99)
Potassium: 3.9 mmol/L (ref 3.5–5.1)
Sodium: 139 mmol/L (ref 135–145)
Total Bilirubin: 0.4 mg/dL (ref 0.3–1.2)
Total Protein: 4.4 g/dL — ABNORMAL LOW (ref 6.5–8.1)

## 2021-04-02 LAB — PHOSPHORUS: Phosphorus: 2.6 mg/dL (ref 2.5–4.6)

## 2021-04-02 LAB — MAGNESIUM: Magnesium: 1.9 mg/dL (ref 1.7–2.4)

## 2021-04-02 LAB — PREPARE RBC (CROSSMATCH)

## 2021-04-02 LAB — HEPARIN LEVEL (UNFRACTIONATED): Heparin Unfractionated: 0.35 IU/mL (ref 0.30–0.70)

## 2021-04-02 MED ORDER — SODIUM CHLORIDE 0.9% IV SOLUTION: Freq: Once | INTRAVENOUS | Status: AC

## 2021-04-02 NOTE — Progress Notes (Signed)
Physical Therapy Treatment Patient Details Name: Scott Mcdowell MRN: [id number] DOB: [birth year] Today's Date: 04/02/2021    History of Present Illness Pt is an 80 year old man admitted on 03/20/21 with R ankle wound and MSSA bacteremia. 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    PT Comments    Patient in bed, alert, agrees to PT session. Difficulty with expression at times. He is able to perform L UE and LE exercises with min assist. PROM to R UE and LE. Rolled in bed to his right with min/mod assist. Requires max assist +1-2 for supine to sit. Patient making progress with mobility and will benefit from continued PT while here to maximize strength and independence.    Follow Up Recommendations  Home health PT;Supervision/Assistance - 24 hour     Equipment Recommendations  None recommended by PT    Recommendations for Other Services       Precautions / Restrictions Precautions Precautions: Fall Restrictions Weight Bearing Restrictions: No    Mobility  Bed Mobility Overal bed mobility: Needs Assistance Bed Mobility: Rolling;Supine to Sit Rolling: Min assist   Supine to sit: Max assist          Transfers                 General transfer comment: Did not attempt. Pt with use of hoyer lift at home >2 months  Ambulation/Gait             General Gait Details: nonambulatory at baseline   Stairs             Wheelchair Mobility    Modified Rankin (Stroke Patients Only)       Balance Overall balance assessment: Needs assistance   Sitting balance-Leahy Scale: Poor                                      Cognition Arousal/Alertness: Awake/alert Behavior During Therapy: Flat affect Overall Cognitive Status: Difficult to assess                                 General Comments: able  to state his name and some other things, although mumbling, garbled speech at other times.      Exercises Other Exercises Other Exercises: PROM R LE and R UE in painfree range. No increased tone noted. L LE active exercises to include: ap, heel slides, hip abd.add, SLR with min assist for guiding movements x 10 reps. L UE exercise active for shoulder flexion x 10 reps    General Comments        Pertinent Vitals/Pain Pain Assessment: Faces Faces Pain Scale: Hurts whole lot Pain Location: R UE and R LE with mobility Pain Descriptors / Indicators: Grimacing;Moaning Pain Intervention(s): Limited activity within patient's tolerance;Monitored during session;Repositioned    Home Living                      Prior Function            PT Goals (current goals can now be found in the care plan section) Acute Rehab PT Goals Patient Stated Goal: unable to state PT Goal Formulation: Patient unable to participate in goal setting Time For Goal  Achievement: 04/09/21 Progress towards PT goals: Progressing toward goals    Frequency    Min 2X/week      PT Plan Current plan remains appropriate    Co-evaluation              AM-PAC PT "6 Clicks" Mobility   Outcome Measure  Help needed turning from your back to your side while in a flat bed without using bedrails?: A Lot Help needed moving from lying on your back to sitting on the side of a flat bed without using bedrails?: A Lot Help needed moving to and from a bed to a chair (including a wheelchair)?: Total Help needed standing up from a chair using your arms (e.g., wheelchair or bedside chair)?: Total Help needed to walk in hospital room?: Total Help needed climbing 3-5 steps with a railing? : Total 6 Click Score: 8    End of Session   Activity Tolerance: Patient limited by pain;Patient limited by fatigue Patient left: in bed;with call bell/phone within reach;with bed alarm set;Other (comment) (Prafo boots  donned) Nurse Communication: Mobility status PT Visit Diagnosis: Other abnormalities of gait and mobility (R26.89);Muscle weakness (generalized) (M62.81);Hemiplegia and hemiparesis Hemiplegia - Right/Left: Right Hemiplegia - dominant/non-dominant: Dominant Hemiplegia - caused by: Cerebral infarction     Time: 0920-0938 PT Time Calculation (min) (ACUTE ONLY): 18 min  Charges:  $Therapeutic Exercise: 8-22 mins                     Zeb Rawl, PT, GCS 04/02/21,9:51 AM

## 2021-04-02 NOTE — TOC Progression Note (Addendum)
Transition of Care [facility]) - Progression Note    Patient Details  Name: Scott Mcdowell MRN: [id number] Date of Birth: 1940/12/28  Transition of [facility]) CM/SW Contact  Bartholomew Crews, RN Phone Number: [phone] 04/02/2021, 2:16 PM  Clinical Narrative:     Notified of patient's readiness to transition home. IV antibiotics arranged with [facility]. Spoke with Jeannene Patella, teaching to be done today. No HH agency at this time. Spoke with [facility], declined to accept citing safety issues in the home. [facility] declined to accept back. [facility] declined to accept. [facility] declined to accept. Pam can likely arrange an infusion nurse through [facility], but there would be no HH for wound care or PT.   MD asked about possible LTAC. Discussed with liaison to review if this is an option per Medicare guidelines - review pending.   TOC following.   UPDATE: [id number] - Received call back from liaison at [facility]. Patient does not meet Medicare guidelines for LTAC at this time.      Expected Discharge Plan and Services                                                 Social Determinants of Health (SDOH) Interventions    Readmission Risk Interventions No flowsheet data found.

## 2021-04-02 NOTE — Progress Notes (Signed)
ANTICOAGULATION CONSULT NOTE - Follow Up Consult  Pharmacy Consult for heparin  Indication:  VTE treatment  No Known Allergies  Patient Measurements: Weight: 81.1 kg (178 lb 12.7 oz) Heparin Dosing Weight: 75kg  Vital Signs: Temp: 98 F (36.7 C) (08/15 0744) Temp Source: Oral (08/15 0744) BP: 145/60 (08/15 0744) Pulse Rate: 67 (08/15 0744)  Labs: Recent Labs    03/31/21 0201 03/31/21 1354 03/31/21 2200 04/01/21 0438 04/02/21 0653  HGB 7.1* 7.4*  --  7.1* 7.0*  HCT 23.1* 23.5*  --  23.0* 22.8*  PLT 270  --   --  301 301  HEPARINUNFRC 0.28* 0.45 0.40 0.29* 0.35  CREATININE 1.38*  --   --  1.36*  --      Estimated Creatinine Clearance: 48 mL/min (A) (by C-G formula based on SCr of 1.36 mg/dL (H)).   Medications:  Scheduled:   (feeding supplement) PROSource Plus  30 mL Oral TID BM   acetaminophen  650 mg Oral BID   Chlorhexidine Gluconate Cloth  6 each Topical Daily   collagenase   Topical Daily   darbepoetin (ARANESP) injection - NON-DIALYSIS  40 mcg Subcutaneous Q Thu-1800   ferrous sulfate  325 mg Oral Q breakfast   metoprolol tartrate  25 mg Oral BID   multivitamin with minerals  1 tablet Oral Daily   pantoprazole  40 mg Oral Q1200   senna-docusate  2 tablet Oral BID   sodium chloride flush  10-40 mL Intracatheter Q12H   thiamine injection  100 mg Intravenous Daily   Or   thiamine  100 mg Oral Daily   vitamin B-12  1,000 mcg Oral Daily   Infusions:   sodium chloride      ceFAZolin (ANCEF) IV 2 g (04/02/21 0515)   heparin 1,500 Units/hr (04/01/21 6060)    Assessment: 80 year old male on 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 PICC was placed on 8/10 and developed new provoked acute DVT in right axillary vein and acute superficial vein thrombosis in right basilic vein discovered 0/45. Heparin drip was subsequently started. PICC removed 8/14, right IJ placed 8/14. Patient diagnosed with acute on chronic anemia  associated with multiple myeloma and CKD and was transfused 4 units of PRBC.  Hgb trending down slowly but stable at 7.0. No documented signs of bleeding. Heparin level 8/15 AM is therapeutic at 0.35 at rate 1500 units/hr, increased from previous level of 0.29 on 8/14. Since within goal and considering bleeding risk, continue current infusion rate and check level tomorrow morning.  Goal of Therapy:  Heparin level 0.3-0.5 units/ml, no bolus Monitor platelets by anticoagulation protocol: Yes   Plan:  Continue heparin infusion rate at 1500 units/hour Check heparin level and CBC daily Monitor signs and symptoms of bleeding  Melanie Crazier, Student Pharmacist

## 2021-04-02 NOTE — TOC Progression Note (Signed)
Transition of Care [facility]) - Progression Note    Patient Details  Name: Scott Mcdowell MRN: [id number] Date of Birth: 1941/08/16  Transition of Care [facility]) CM/SW Contact  Bartholomew Crews, RN Phone Number: [phone] 04/02/2021, 4:08 PM  Clinical Narrative:     Spoke with patient's spouse, [name], on the phone to discuss transition planning. Advised that CM was not successful with finding a home health agency, however, [facility] should be able to provide a nurse once a week to manage his IV line and antibiotics. [name] verbalized understanding and stated that his last hospitalization in July was at [facility] and they were unable to find an accepting home health agency. [name] stated that her son and daughters are available to help some.  Advised that [facility] will provide teaching for administering the IV antibiotics and that the bedside nurses can show her how do the wound care.   Discussed DME needs. [name] stated that patient has a hospital bed and that the livingroom has been turned into his bedroom. Patient does not have a hoyer lift. [name] is agreeable to this. Referral to [facility] for delivery to the home.   [name] stated that they have their routine for feeding and changing. She stated that she cooks for him, but he doesn't always eat. She stated that he thinks drinking the shakes is enough sometimes. Discussed lack of nutrition can lead to worsening wounds. [name] added that the wound on his ankle started dime size and grew quickly despite nursing care and dressing changes,  so she understands.   Discussed conversation with palliative care. [name] is hoping to not make the decision for hospice unless he says he wants it. "It is his call." Discussed that PCP can assist with hospice referral when the time is right or she can call [facility] for assistance. [name] expressed appreciation for the information.   Patient will need ambulance transportation for discharge home.  Demographics verified.   TOC following for transition needs.       Expected Discharge Plan and Services                                                 Social Determinants of Health (SDOH) Interventions    Readmission Risk Interventions No flowsheet data found.

## 2021-04-02 NOTE — Progress Notes (Signed)
PROGRESS NOTE    Scott Mcdowell [facility]  NLZ:[id number] DOB: 05-Jun-1941 DOA: 03/20/2021 PCP: [name], MD    No chief complaint on file.   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  He has been hospitalized at [facility] multiple times in the last few months.  His right ankle pressure wound has been getting progressively worse with some drainage. In the ED patient was noted to be hypotensive with blood pressures in the 70-80s, hemoglobin was 5.4, creatinine was 2.5.   No history of overt blood loss. 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 -Blood Cx w/ MSSA Bacteremia-secondary to right ankle osteomyelitis, now on Ancef -Palliative following  Assessment & Plan:   Principal Problem:   Acute on chronic anemia Active Problems:   ARF (acute renal failure) (HCC)   Decubitus ulcer of right ankle, stage 4 (HCC) - present on admission   CKD (chronic kidney disease), stage IV (HCC)   GIB (gastrointestinal bleeding)   Multiple myeloma not having achieved remission (HCC)   Anemia in chronic kidney disease   Hypotension due to hypovolemia   Dysphagia   Expressive aphasia   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  Unfortunately, no home health agency accepting.  Only will have support for IV abx.  Wife notes she's agreeable to discharge home.  The family previously cared for him.  Will need teaching in dressing changes.  TOC arranging for hoyer lift.   He has Loris Winrow hospital bed.   Plan for d/c tomorrow if stable Recommend palliative to follow as outpatient   Severe Sepsis MSSA bacteremia Right ankle wound, right ankle osteomyelitis, pressure wound -Continue IV Ancef - MSSA + 8/2 - repeat cultures 8/4 negative -Appreciate infectious disease input, recommended line holiday and port removal - Port removed 8/6 by IR -Continue wound care -Multiple discussions with family suggesting palliative, hospice and comfort focused care as most appropriate in this patient by previous MD -Palliative team following, continue full scope  -IR placed PICC 9/02, now complicated by DVT -PICC removed 8/14, tunneled catheter placed 8/14 -ID recommends 6 weeks of IV Ancef.  No recommendation for echo in this setting (echo previously done on 8/3 - EF 45-50%, RWMA, see report).    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  1-39% R ICA stenosis.   -Suspect hypotension is multifactorial, likely dehydration, blood loss and infection contributing, blood pressure more stable now -Eliquis on hold in the setting of severe anemia - follow for consideration of resumption (if he does well on heparin as noted below) -Appreciate neurology input -> noted L hemispheric watershed  infarct in setting of hypoperfusion from MSSA bacteremia with underlying LICA chronic occlussion - recommended palliative care, no recommendation for further aggressive neurovascular workup -  neurology recommended maintaining SBP >140 -PT OT, SLP evaluations completed, home therapy recommended, he is total care at baseline  Acute DVT Involving Right Axillary Vein  Acute Superficial Vein Thrombosis involving R Basilic Vein  Catheter Associated DVT - follow on heparin gtt, if stable on this, will plan for doac - PICC is functioning, will maintain at this time - follow RUE swelling while on anticoagulation, monitor for need to remove pICC - given need for several weeks of antibiotics, discussed case with IR who noted it'd be reasonable to exchange and place tunneled line  Acute on chronic anemia -Suspect this is multifactorial, has underlying multiple myeloma, chronic kidney disease, no history of melena or hematochezia however he was heme positive. - watch hb closely in setting of starting anticoagulation- relatively stable, will transfuse 1 unit given around 7 -> will need follow up as able outpatient - he's had several transfusions over hospitalization after review of his transfusion record this is his 5th unit - will need to discuss risks/benefits of continued anticoagulation vs not anticoagulating after seeing how his BP trends after transfusion -This will be his 5th units of PRBC, anemia panel suggestive of chronic disease, no iron deficiency -He is not Brittiny Levitz candidate for endoscopic evaluation in the setting with acute CVA, bacteremia etc. -Hemoglobin is stable now, no evidence of active bleeding, -Changed PPI to p.o   Hypotension -See discussion above, likely multifactorial, secondary to bacteremia, severe anemia, dehydration likely contributing, antibiotics as above -Blood pressure stable now - neurology recommending maintaining SBP >140 due to risk of stroke in setting of hypoperfusion   Mildly Reduced EF  Regional Wall Motion Abnormalities Appears new, continue to follow Will discuss with family to plan follow up  CKD 4 -Baseline creatinine around 1.5, worse in the  setting of severe anemia, hypotension etc. -Now stable, IV fluids discontinued   Dysphagia -SLP evaluation ongoing, continue dysphagia diet   Multiple myeloma -On Revlimid at baseline, this is on hold in the setting of bacteremia/infection   Hypokalemia -Replaced   Severe protein calorie malnutrition -Add supplements as tolerated   Wounds Appreciate wound care recs C/s for RUE wound recs (?blister ruptured)  Ethics -This is Thorne Wirz chronically ill elderly male who is bedbound, right hemiplegia, pressure wounds with significant functional decline over the last 6 months to Vanice Rappa year, multiple myeloma, H/o CVA w/ Hemiplegia, CKD, now with acute CVA, bacteremia, severe anemia, protein calorie malnutrition. -Prior hospitalist discussed overall poor prognosis with patient's daughter and spouse, recommended DNR and symptom/comfort focused care -Palliative consult appreciated, family requests full scope of care  DVT prophylaxis: heparin gtt Code Status: full  Family Communication: discussed with wife over phone 8/14 Disposition:   Status is: Inpatient  Remains inpatient appropriate because:Inpatient level of care appropriate due to severity of illness  Dispo: The patient is from: Home              Anticipated d/c is to: Home              Patient currently is not medically stable to d/c.   Difficult to place patient No       Consultants:  neurology  Procedures:   Summary:     Right:  Unable to assess Huyen Perazzo large portion of the venous system due to the patient's  positioning, and pain tolerance. Visualized portions appeared  patent and  compressible.     Left:  No evidence of thrombosis in the subclavian.     Echo IMPRESSIONS     1. Left ventricular ejection fraction, by estimation, is 45 to 50%. The  left ventricle has mildly decreased function. The left ventricle  demonstrates regional wall motion abnormalities (see scoring  diagram/findings for description). There is mild  left  ventricular hypertrophy. Left ventricular diastolic parameters are  indeterminate.   2. Right ventricular systolic function is normal. The right ventricular  size is normal.   3. Left atrial size was mildly dilated.   4. The mitral valve is normal in structure. Moderate mitral valve  regurgitation. No evidence of mitral stenosis.   5. Tricuspid valve regurgitation is moderate.   6. The aortic valve is tricuspid. There is moderate calcification of the  aortic valve. There is moderate thickening of the aortic valve. Aortic  valve regurgitation is not visualized. Mild to moderate aortic valve  sclerosis/calcification is present,  without any evidence of aortic stenosis.   7. The inferior vena cava is normal in size with greater than 50%  respiratory variability, suggesting right atrial pressure of 3 mmHg.   Conclusion(s)/Recommendation(s): No intracardiac source of embolism  detected on this transthoracic study. Yasamin Karel transesophageal echocardiogram is  recommended to exclude cardiac source of embolism if clinically indicated.   Carotid US Summary:  Right Carotid: Velocities in the right ICA are consistent with Delanie Tirrell 1-39%  stenosis.                 Non-hemodynamically significant plaque <50% noted in the  CCA. The                 ECA appears <50% stenosed.   Left Carotid: Velocities in the left ICA are consistent with Abdulrahman Bracey 80-99%  stenosis.                Non-hemodynamically significant plaque <50% noted in the  CCA. The                ECA appears <50% stenosed.   Vertebrals:  Right vertebral artery demonstrates antegrade flow. Left  vertebral               artery demonstrates high resistant flow with loss of  diastolic               flow.  Subclavians: Normal flow hemodynamics were seen in the right subclavian  artery.               Abnormal flow seen in left subclavian artery (LUE AVF).    Procedure:    Image guided right IJ tunneled DL central catheter.   Removal of right  PICC, secondary to cath-associated thrombosis   Complications: None Recommendations:  - Ok to use - Do not submerge - Routine line care  - Care with skin, given friability - ok to continue [facility]  Antimicrobials: Anti-infectives (From admission, onward)    Start     Dose/Rate Route Frequency Ordered Stop   03/24/21 2200  ceFAZolin (ANCEF) IVPB 2g/100 mL premix        2 g 200 mL/hr over 30 Minutes Intravenous Every 8 hours 03/24/21 1522 05/03/21 2359   03/22/21 0000  metroNIDAZOLE (FLAGYL) IVPB 500 mg  Status:  Discontinued        500 mg 100 mL/hr over 60 Minutes Intravenous Every 12 hours 03/21/21 1214 03/25/21 1228   03/21/21 2200  ceFAZolin (ANCEF) IVPB 2g/100 mL premix  Status:  Discontinued        2 g 200 mL/hr over 30 Minutes Intravenous Every 12 hours 03/21/21 1214 03/24/21 1522   03/21/21 0230  piperacillin-tazobactam (ZOSYN) IVPB 3.375 g  Status:  Discontinued        3.375 g 12.5 mL/hr over 240 Minutes Intravenous Every 8 hours 03/20/21 2106 03/21/21 1214   03/20/21 2200  piperacillin-tazobactam (ZOSYN) IVPB 3.375 g  Status:  Discontinued        3.375 g 100 mL/hr over 30 Minutes Intravenous Every 8 hours 03/20/21 2104 03/20/21 2106   03/20/21 2100  vancomycin variable dose per unstable renal function (pharmacist dosing)  Status:  Discontinued         Does not apply See admin instructions 03/20/21 2100 03/21/21 1214   03/20/21 1530  piperacillin-tazobactam (ZOSYN) IVPB 3.375 g        3.375 g 100 mL/hr over 30 Minutes Intravenous  Once 03/20/21 1515 03/20/21 1922   03/20/21 1530  vancomycin (VANCOREADY) IVPB 1500 mg/300 mL        1,500 mg 150 mL/hr over 120 Minutes Intravenous  Once 03/20/21 1515 03/20/21 1835          Subjective: Pain with removal of dressing  Objective: Vitals:   04/02/21 1213 04/02/21 1715 04/02/21 1830 04/02/21 1857  BP: (!) 149/61 (!) 161/53 (!) 142/66 (!) 167/52  Pulse: 75 84 100 (!) 109  Resp: '18 18 20 20  ' Temp: 98.4 F (36.9 C) 98.6 F  (37 C) 98.6 F (37 C) 98.6 F (37 C)  TempSrc: Axillary Oral Oral Oral  SpO2: 96% 96% 95% 96%  Weight:        Intake/Output Summary (Last 24 hours) at 04/02/2021 1911 Last data filed at 04/02/2021 1702 Gross per 24 hour  Intake 824.46 ml  Output 650 ml  Net 174.46 ml   Filed Weights   03/20/21 1500 04/01/21 0458 04/02/21 0409  Weight: 75.9 kg 77 kg 81.1 kg    Examination:  General: No acute distress. Cardiovascular: RRR. Tunneled line on R Lungs: unlabored Abdomen: Soft, nontender, nondistended Neurological: R sided weakness, aphasia Skin: wound noted to RUE, erosion? Ruptured blister? Extremities: LE dressing intact     Data Reviewed: I have personally reviewed following labs and imaging studies  CBC: Recent Labs  Lab 03/29/21 0022 03/30/21 0305 03/31/21 0201 03/31/21 1354 04/01/21 0438 04/02/21 0653  WBC 4.4 4.7 4.9  --  5.2 5.7  NEUTROABS 3.3 3.6 3.8  --  4.1  --   HGB 7.6* 7.5* 7.1* 7.4* 7.1* 7.0*  HCT 25.1* 24.5* 23.1* 23.5* 23.0* 22.8*  MCV 98.8 97.6 97.1  --  97.0 98.3  PLT 208 241 270  --  301 510    Basic Metabolic Panel: Recent Labs  Lab 03/29/21 0022 03/30/21 0305 03/31/21 0201 04/01/21 0438 04/02/21 0653  NA 134* 135 133* 135 139  K 4.2 3.8 3.7 3.6 3.9  CL 104 106 105 106 110  CO2 '23 25 22 22 24  ' GLUCOSE 90 91 89 122* 97  BUN 25* 22 24* 28* 27*  CREATININE 1.43* 1.40* 1.38* 1.36* 1.20  CALCIUM 7.7* 7.7* 7.6* 7.7* 7.8*  MG 2.3 2.2 2.0 2.0 1.9  PHOS 3.0 2.8 3.1 2.9 2.6    GFR: Estimated Creatinine Clearance: 54.4 mL/min (by C-G formula based on SCr of 1.2 mg/dL).  Liver Function Tests: Recent Labs  Lab 03/29/21 0022 03/30/21 0305 03/31/21 0201 04/01/21 0438 04/02/21 0653  AST '18 16 20 21 ' 18  ALT 6 <5 <5 <5 <5  ALKPHOS 59 65 61 77 60  BILITOT 0.3 0.5 0.4 0.2* 0.4  PROT 4.4* 4.2* 4.3* 4.4* 4.4*  ALBUMIN 1.5* 1.4* 1.4* 1.4* 1.4*    CBG: No results for input(s): GLUCAP in the last 168 hours.   No results found for  this or any previous visit (from the past 240 hour(s)).        Radiology Studies: IR US Guide Vasc Access Right  Result Date: 04/02/2021 INDICATION: 80 year old male referred for removal of Kandace Elrod right upper extremity PICC secondary to catheter associated thrombus, and placement of tunneled central venous catheter.   The patient has Michelena Culmer left upper extremity dialysis fistula.   EXAM: TUNNELED CENTRAL VENOUS CATHETER   MEDICATIONS: None   ANESTHESIA/SEDATION: None   FLUOROSCOPY TIME:  Fluoroscopy Time: 2 minutes 0 seconds   COMPLICATIONS: None   PROCEDURE: After written informed consent was obtained, patient was placed in the supine position on angiographic table.   Patency of the right internal jugular vein was confirmed with ultrasound with image documentation.   Patient was prepped and draped in the usual sterile fashion including the right neck and right superior chest.   Using ultrasound guidance, the skin and subcutaneous tissues overlying the right internal jugular vein were generously infiltrated with 1% lidocaine without epinephrine. Using ultrasound guidance, the right internal jugular vein was punctured with Doretha Goding micropuncture needle, and an 018 wire was advanced into the right heart confirming venous access. Cabela Pacifico small stab incision was made with an 11 blade scalpel.   Peel-away sheath was placed over the wire, and then the wire was removed, marking the wire for estimation of internal catheter length.   The chest wall was then generously infiltrated with 1% lidocaine for local anesthesia along the tissue tract. Small stab incision was made with 11 blade scalpel, and then the catheter was back tunneled to the puncture site at the right internal jugular vein.   Catheter was pulled through the tract, with the catheter amputated at 24 cm.   Catheter was advanced through the peel-away sheath, and the peel-away sheath was removed.   Final image was stored.   The catheter was anchored to the chest wall with 2  retention sutures, and Derma bond was used to seal the right internal jugular vein incision site and at the right chest wall.   Patient tolerated the procedure well and remained hemodynamically stable throughout.   No complications were encountered and no significant blood loss was encountered.   IMPRESSION: Status post right IJ tunneled cuffed central venous catheter.   Signed,   Dulcy Fanny. Dellia Nims, RPVI   [facility]   [facility]     Electronically Signed   By: Corrie Mckusick D.O.   On: 04/01/2021 12:24         Scheduled Meds:  (feeding supplement) PROSource Plus  30 mL Oral TID BM   sodium chloride   Intravenous Once   acetaminophen  650 mg Oral BID   Chlorhexidine Gluconate Cloth  6 each Topical Daily   collagenase   Topical Daily   darbepoetin (ARANESP) injection - NON-DIALYSIS  40 mcg Subcutaneous Q Thu-1800   ferrous sulfate  325 mg Oral Q breakfast   metoprolol tartrate  25 mg Oral BID   multivitamin with minerals  1 tablet Oral Daily   pantoprazole  40 mg Oral Q1200   senna-docusate  2 tablet Oral BID   sodium chloride flush  10-40 mL Intracatheter Q12H   thiamine injection  100 mg Intravenous Daily   Or   thiamine  100 mg Oral Daily   vitamin B-12  1,000 mcg Oral Daily   Continuous Infusions:  sodium chloride      ceFAZolin (ANCEF) IV 2 g (04/02/21 1243)   heparin 1,500 Units/hr (04/02/21 1425)     LOS: 13 days    Time spent: over 30 min    Fayrene Helper, MD [facility]   To contact the attending provider between 7A-7P or the covering provider during after hours 7P-7A, please log into the web site [email or web address] and access using universal  password for that web site. If you do not have the password, please call the hospital operator.  04/02/2021, 7:11 PM

## 2021-04-03 LAB — COMPREHENSIVE METABOLIC PANEL
ALT: <5 U/L (ref 0–44)
AST: 17 U/L (ref 15–41)
Albumin: 1.4 g/dL — ABNORMAL LOW (ref 3.5–5.0)
Alkaline Phosphatase: 58 U/L (ref 38–126)
Anion gap: 6 (ref 5–15)
BUN: 32 mg/dL — ABNORMAL HIGH (ref 8–23)
CO2: 23 mmol/L (ref 22–32)
Calcium: 8 mg/dL — ABNORMAL LOW (ref 8.9–10.3)
Chloride: 109 mmol/L (ref 98–111)
Creatinine, Ser: 1.23 mg/dL (ref 0.61–1.24)
GFR, Estimated: 59 mL/min — ABNORMAL LOW (ref >60)
Glucose, Bld: 91 mg/dL (ref 70–99)
Potassium: 3.7 mmol/L (ref 3.5–5.1)
Sodium: 138 mmol/L (ref 135–145)
Total Bilirubin: 0.6 mg/dL (ref 0.3–1.2)
Total Protein: 4.7 g/dL — ABNORMAL LOW (ref 6.5–8.1)

## 2021-04-03 LAB — CBC WITH DIFFERENTIAL/PLATELET
Abs Immature Granulocytes: 0.05 10*3/uL (ref 0.00–0.07)
Basophils Absolute: 0 10*3/uL (ref 0.0–0.1)
Basophils Relative: 0 %
Eosinophils Absolute: 0.1 10*3/uL (ref 0.0–0.5)
Eosinophils Relative: 1 %
HCT: 25.8 % — ABNORMAL LOW (ref 39.0–52.0)
Hemoglobin: 8 g/dL — ABNORMAL LOW (ref 13.0–17.0)
Immature Granulocytes: 1 %
Lymphocytes Relative: 13 %
Lymphs Abs: 0.7 10*3/uL (ref 0.7–4.0)
MCH: 30.1 pg (ref 26.0–34.0)
MCHC: 31 g/dL (ref 30.0–36.0)
MCV: 97 fL (ref 80.0–100.0)
Monocytes Absolute: 0.3 10*3/uL (ref 0.1–1.0)
Monocytes Relative: 4 %
Neutro Abs: 4.6 10*3/uL (ref 1.7–7.7)
Neutrophils Relative %: 81 %
Platelets: 285 10*3/uL (ref 150–400)
RBC: 2.66 MIL/uL — ABNORMAL LOW (ref 4.22–5.81)
RDW: 17.2 % — ABNORMAL HIGH (ref 11.5–15.5)
WBC: 5.7 10*3/uL (ref 4.0–10.5)
nRBC: 0 % (ref 0.0–0.2)

## 2021-04-03 LAB — TYPE AND SCREEN
ABO/RH(D): O POS
Antibody Screen: NEGATIVE
Unit division: 0

## 2021-04-03 LAB — HEPARIN LEVEL (UNFRACTIONATED): Heparin Unfractionated: 0.31 IU/mL (ref 0.30–0.70)

## 2021-04-03 LAB — BPAM RBC
Blood Product Expiration Date: 202209192359
ISSUE DATE / TIME: 202208151834
Unit Type and Rh: 5100

## 2021-04-03 LAB — MAGNESIUM: Magnesium: 1.9 mg/dL (ref 1.7–2.4)

## 2021-04-03 LAB — PHOSPHORUS: Phosphorus: 2.6 mg/dL (ref 2.5–4.6)

## 2021-04-03 MED ORDER — FERROUS SULFATE 325 (65 FE) MG PO TABS: 325.0000 mg | ORAL_TABLET | Freq: Every day | ORAL | 0 refills | Status: AC

## 2021-04-03 MED ORDER — HEPARIN SOD (PORK) LOCK FLUSH 100 UNIT/ML IV SOLN
250.0000 [IU] | INTRAVENOUS | Status: AC | PRN
  Administered 2021-04-03 (×2): 250 [IU]
  Filled 2021-04-03: qty 2.5

## 2021-04-03 MED ORDER — COLLAGENASE 250 UNIT/GM EX OINT: TOPICAL_OINTMENT | Freq: Every day | CUTANEOUS | 0 refills | Status: AC

## 2021-04-03 MED ORDER — METOPROLOL TARTRATE 25 MG PO TABS: 25.0000 mg | ORAL_TABLET | Freq: Two times a day (BID) | ORAL | 0 refills | Status: AC

## 2021-04-03 MED ORDER — POLYETHYLENE GLYCOL 3350 17 G PO PACK: 17.0000 g | PACK | Freq: Every day | ORAL | 0 refills | Status: AC | PRN

## 2021-04-03 MED ORDER — OXYCODONE HCL 5 MG PO TABS: 5.0000 mg | ORAL_TABLET | Freq: Four times a day (QID) | ORAL | 0 refills | Status: AC | PRN

## 2021-04-03 MED ORDER — CYANOCOBALAMIN 1000 MCG PO TABS: 1000.0000 ug | ORAL_TABLET | Freq: Every day | ORAL | 0 refills | Status: AC

## 2021-04-03 MED ORDER — CEFAZOLIN IV (FOR PTA / DISCHARGE USE ONLY): 2.0000 g | Freq: Three times a day (TID) | INTRAVENOUS | 0 refills | Status: AC

## 2021-04-03 MED ORDER — ACETAMINOPHEN 160 MG/5ML PO SOLN: 650.0000 mg | Freq: Four times a day (QID) | ORAL | 0 refills | Status: AC | PRN

## 2021-04-03 MED ORDER — THIAMINE HCL 100 MG PO TABS: 100.0000 mg | ORAL_TABLET | Freq: Every day | ORAL | 0 refills | Status: AC

## 2021-04-03 NOTE — Consult Note (Signed)
[facility] Nurse wound consult note Consultation was completed by review of records, images and assistance from the bedside nurse/clinical staff.  Reason for Consult: right arm wound Patient on chronic steroids with fragile skin, area blistered at the time of admission related to edema? Localized skin tear?  Wound type: partial thickness skin loss right UE Pressure Injury POA: NA Wound bed: 100% pink, clean, superficial  Drainage (amount, consistency, odor) serosanguinous in image Periwound:ecchymosis  Dressing procedure/placement/frequency: single layer non adherent with antiseptic properties to protect, insulate and promote moist wound healing.  Ok to teach CG, simple topical care.  Xeroform can be purchased over the counter or online.    Ok to shower or bathe without dressings Replace after bathing with single layer of xeroform, dry gauze, secure with kerlix and ACE wrap if needed to keep in place. NO TENSION on ACE wrap.   Discussed POC with patient and bedside nurse.  Re consult if needed, will not follow at this time. Thanks  Araiyah Cumpton [employer], RN,CWOCN, CNS, [place] [phone])

## 2021-04-03 NOTE — TOC Transition Note (Addendum)
Transition of Care [facility]) - CM/SW Discharge Note   Patient Details  Name: Scott Mcdowell MRN: [id number] Date of Birth: 01/25/1941  Transition of [facility]) CM/SW Contact:  Carles Collet, RN Phone Number: 04/03/2021, 2:58 PM   Clinical Narrative:    13:00 Spoke w [place] re delivery of hoyer lift. She will be contact w wife to get it delivered today, she is aware that patient has DC order for today. Spoke w wife, confirmed DC plan. Spoke w Jeannene Patella of [employer] who plans on doing teaching at 2:00 today with wife for IV Abx. Pam has arranged for [facility] RN for line care/ labs.  PTAR forms placed on chart. PTAR called at 2:45, advised that there are several patient ahead, and it will be a few hours wait.     Final next level of care: Home w Home Health Services Barriers to Discharge: No Barriers Identified   Patient Goals and CMS Choice Patient states their goals for this hospitalization and ongoing recovery are:: to go home   Choice offered to / list presented to : NA  Discharge Placement                       Discharge Plan and Services                            [facility] Agency: [employer] Date HH Agency Contacted: 04/03/21 Time [place]: [id number] Representative spoke with at [place]: [place] Determinants of Health ([place]) Interventions     Readmission Risk Interventions No flowsheet data found.

## 2021-04-03 NOTE — Progress Notes (Addendum)
22:00-EMS at bedside to take taken home. Called and spoke with wife to notify of patient. pending arrival home. AVS sent with patient. Also reminded wife to call and set follow up appointments

## 2021-04-03 NOTE — Progress Notes (Signed)
  Speech Language Pathology Treatment: Cognitive-Linquistic  Patient Details Name: Scott Mcdowell MRN: [id number] DOB: 11-04-1940 Today's Date: 04/03/2021 Time: 6568-1275 SLP Time Calculation (min) (ACUTE ONLY): 21 min  Assessment / Plan / Recommendation Clinical Impression  SLP followed up for aphasia intervention. Attempted to check on pts swallowing, however pt declined PO snack despite encouragement. Pts spouse and daughter at bedside. Pt alert, SLP facilitated spontaneous naming tasks. Pt noted to have errors with semantic paraphasias, some neologisms, and moderate expressive deficits limiting output to the word level. SLP attempted multiple approaches for improved expressive language including phomemic cues, carrier phrases, and written cues.  Pt was most stimulable for improved expressive output with use of melodic intonation therapy improving communication to the phrase level. Family was very encouraged by pts improved output with singing. SLP provided written handout for aphasia education and answered questions regarding pts specific deficits. SLP to continue to follow during acute stay, per RN pt supposed to DC home this date. Recommend HH SLP follow up.   HPI HPI: Pt is an 80 y/o male who presented to the ED secondary to right ankle wound. MRI brain 8/2: Multifocal acute ischemia in a watershed distribution in the left hemisphere. CXR 8/2 negative. Per EMR, pt's wife "noted pt having issues with swallowing for several weeks now. 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      SLP Plan  Continue with current plan of care       Recommendations  Diet recommendations: Dysphagia 3 (mechanical soft);Thin liquid Liquids provided via: Cup;Straw Medication Administration: Whole  meds with liquid Supervision: Staff to assist with self feeding Compensations: Slow rate;Small sips/bites;Follow solids with liquid Postural Changes and/or Swallow Maneuvers: Seated upright 90 degrees                Oral Care Recommendations: Oral care BID Follow up Recommendations: Home health SLP SLP Visit Diagnosis: Aphasia (R47.01) Plan: Continue with current plan of care       [place], CCC-SLP Acute Rehabilitation Services   04/03/2021, 2:58 PM

## 2021-04-03 NOTE — Progress Notes (Signed)
[facility] ([facility])  Hospital Liaison RN note         Notified by TOC manager of patient/family request for [facility] Palliative services at home after discharge.              [facility] Palliative team will follow up with patient after discharge.         Please call with any hospice or palliative related questions.         Thank you for the opportunity to participate in this patient's care.     Chrislyn King, BSN, RN [facility] Hospital Liaison (listed on [facility] under Hospice/[facility])    [phone] (24h on call)    

## 2021-04-03 NOTE — Progress Notes (Signed)
ANTICOAGULATION CONSULT NOTE - Follow Up Consult  Pharmacy Consult for heparin  Indication:  VTE treatment  No Known Allergies  Patient Measurements: Weight: 81.1 kg (178 lb 12.7 oz) Heparin Dosing Weight: 75kg  Vital Signs: Temp: 98.5 F (36.9 C) (08/16 0728) Temp Source: Axillary (08/16 0728) BP: 132/67 (08/16 0728) Pulse Rate: 76 (08/16 0728)  Labs: Recent Labs    04/01/21 0438 04/02/21 0653 04/03/21 0430  HGB 7.1* 7.0* 8.0*  HCT 23.0* 22.8* 25.8*  PLT 301 301 285  HEPARINUNFRC 0.29* 0.35 0.31  CREATININE 1.36* 1.20 1.23     Estimated Creatinine Clearance: 53.1 mL/min (by C-G formula based on SCr of 1.23 mg/dL).   Medications:  Scheduled:   (feeding supplement) PROSource Plus  30 mL Oral TID BM   acetaminophen  650 mg Oral BID   Chlorhexidine Gluconate Cloth  6 each Topical Daily   collagenase   Topical Daily   darbepoetin (ARANESP) injection - NON-DIALYSIS  40 mcg Subcutaneous Q Thu-1800   ferrous sulfate  325 mg Oral Q breakfast   metoprolol tartrate  25 mg Oral BID   multivitamin with minerals  1 tablet Oral Daily   pantoprazole  40 mg Oral Q1200   senna-docusate  2 tablet Oral BID   sodium chloride flush  10-40 mL Intracatheter Q12H   thiamine injection  100 mg Intravenous Daily   Or   thiamine  100 mg Oral Daily   vitamin B-12  1,000 mcg Oral Daily   Infusions:   sodium chloride      ceFAZolin (ANCEF) IV 2 g (04/03/21 0553)   heparin 1,500 Units/hr (04/03/21 0754)    Assessment: 80 year old male on PTA Eliquis 2.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 PICC was placed on 8/10 and developed new provoked acute DVT in right axillary vein and acute superficial vein thrombosis in right basilic vein discovered 5/00. Heparin drip was subsequently started. PICC removed 8/14, right IJ placed 8/14. Patient diagnosed with acute on chronic anemia associated with multiple myeloma and CKD and was transfused 5 units of PRBC in  total.  HL 0.31 is therapeutic at infusion rate of 1500 units/hr but decreased from previous level of 0.35 on 8/15. Hgb 8.0 after receiving 1 unit of PRBC on 8/15. No signs of bleeding per RN. Since the HL is on the lower end of the goal range and considering the patient's bleeding risk, increase current infusion rate modestly to 1550 units/hr and check level tomorrow morning.  Goal of Therapy:  Heparin level 0.3-0.5 units/ml, no bolus Monitor platelets by anticoagulation protocol: Yes   Plan:  Increase heparin infusion rate to 1550 units/hour Check heparin level and CBC daily Monitor signs and symptoms of bleeding F/u transition back to apixaban  Melanie Crazier, Student Pharmacist

## 2021-04-03 NOTE — Discharge Summary (Signed)
Physician Discharge Summary  [facility] OZD:[id number] DOB: May 04, 1941 DOA: 03/20/2021  PCP: Scott Sanes, MD  Admit date: 03/20/2021 Discharge date: 04/03/2021  Time spent: 40 minutes  Recommendations for Outpatient Follow-up:  Follow outpatient CBC/CMP Complete IV abx per ID Remove tunneled line once abx complete Trend hb outpatient, recurrent anemia here requiring transfusion RUE dvt, but due to recurrent anemia requiring 5 units - discharged without anticoagulation Follow up with neurology for his strokes - per goals of care Follow up with oncology - defer revlimid resumption to oncology Follow up with PCP Follow up with palliative care outpatient Continue wound care outpatient     Discharge Diagnoses:  Principal Problem:   Acute on chronic anemia Active Problems:   ARF (acute renal failure) (HCC)   Decubitus ulcer of right ankle, stage 4 (HCC) - present on admission   CKD (chronic kidney disease), stage IV ([place])   GIB (gastrointestinal bleeding)   Multiple myeloma not having achieved remission ([place])   Anemia in chronic kidney disease   Hypotension due to hypovolemia   Dysphagia   Expressive aphasia   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 [facility])   SIRS (systemic inflammatory response syndrome) ([place])   Staphylococcus aureus bacteremia   Discharge Condition: stable  Diet recommendation: dysphagia 3  Filed Weights   03/20/21 1500 04/01/21 0458 04/02/21 0409  Weight: 75.9 kg 77 kg 81.1 kg    History of present illness:  80 year old male who is chronically bedbound, with multiple myeloma, stage IV chronic kidney disease, history of CVA, right hemiplegia, history of right upper extremity DVT on Eliquis was brought to the ED by family with multiple complaints including right ankle wound, ongoing expressive aphasia and difficulty swallowing for 3 to 4 days.  He has been hospitalized at [facility] multiple times in  the last few months.  His right ankle pressure wound has been getting progressively worse with some drainage.    He was admitted for MSSA bacteremia in the setting of right ankle osteomyelitis.  Hospitalization was complicated by stroke with watershed infarcts (his L ICA had >80% stenosis) as well as Scott Mcdowell catheter associated RUE DVT.  Palliative care was consulted and it was recommended that family consider palliative/comfort route, thought patient's wife did not want to make the decision for him and desired to continue full scope of care.    Plan at this time for discharge home with home abx.  No home health agency accepted him, only home health for antibiotics at this time.  Family was comfortable with him coming home.  Dressing changes were taught prior to discharge.  Recommended palliative to follow outpatient for continued goals of care.  See below for additional details    Hospital Course:  Goals of care Plan for discharge home today.  Unfortunately, no home health agency accepting.  Only will have support for IV abx.  Wife notes she's agreeable to discharge home.  The family previously cared for him.  Will need teaching in dressing changes.  TOC arranging for hoyer lift.  He has Scott Mcdowell hospital bed.   Recommend palliative to follow as outpatient  to continue goals of care discussions.  Discussed his significant illness today.  Daughter present today seemed to understand how sick we believe Scott Mcdowell to be, but his wife does not want to make Scott Mcdowell decision for him.     Severe Sepsis MSSA bacteremia Right ankle wound, right ankle osteomyelitis, pressure wound -Continue IV Ancef - MSSA +  8/2 - repeat cultures 8/4 negative -Appreciate infectious disease input, recommended line holiday and port removal - Port removed 8/6 by IR -Continue wound care - see below -Multiple discussions with family suggesting palliative, hospice and comfort focused care as most appropriate in this patient by previous  MD -Palliative team following, continue full scope  -IR placed PICC 3/01, now complicated by DVT -PICC removed 8/14, tunneled catheter placed 8/14 -ID recommends 6 weeks of IV Ancef (to complete 9/15).  Follow up appointment on 8/26 per discusion with ID today.  No recommendation for echo in this setting (echo previously done on 8/3 - EF 45-50%, RWMA, see report).    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  1-39% R ICA stenosis.   -Suspect hypotension is multifactorial, likely dehydration, blood loss and infection contributing, blood pressure more stable now -Eliquis on hold in the setting of severe anemia requiring multiple tranfusions -Appreciate neurology input -> noted L hemispheric watershed infarct in setting of hypoperfusion from MSSA bacteremia with underlying LICA chronic occlussion - recommended palliative care, no recommendation for further aggressive neurovascular workup - neurology recommended maintaining SBP >140 -PT OT, SLP evaluations completed, home therapy recommended, he is total care at baseline   Acute DVT Involving Right Axillary Vein  Acute Superficial Vein Thrombosis involving R Basilic Vein  Catheter Associated DVT - given patients recurrent anemia requiring multiple transfusions (5 during the 2 weeks of his admission), though he has not had any overt bleeding, I think that continued anticoagulation risks of bleeding outweigh benefit at this time - had  discussion with family regarding risks of both - for Scott Mcdowell patient requiring 5 units of blood in 2 weeks, I think to go home on Malachi Kinzler blood thinner would probably be too risky - he'll have some labs checked with abx therapy, can follow this and continue to have discussions with PCP  - PICC removed 8/14   Acute on chronic anemia -Suspect this is multifactorial, has underlying multiple myeloma, chronic kidney disease, no history of melena or hematochezia however he was heme positive. - watch hb closely in setting of starting anticoagulation- relatively stable, but he's required 5 units since his admission and presented to the hospital with Eyan Hagood hemoglobin of 5.7.  Given this, as noted above, discussed that the risk of anticoagulation, likely outweigh the benefits - especially with Carlene Bickley plan for home.  Given return precautions.  Follow reepat labs.  Will discontinue anticoagulation at discharge. -He is not Jhaden Pizzuto candidate for endoscopic evaluation in the setting with acute CVA, bacteremia etc. -Hemoglobin is stable now, no evidence of active bleeding -Changed PPI to p.o   Hypotension -See discussion above, likely multifactorial, secondary to bacteremia, severe anemia, dehydration likely contributing, antibiotics as above -Blood pressure stable now - neurology recommending maintaining SBP >140 due to risk of stroke in setting of hypoperfusion   Mildly Reduced EF  Regional Wall Motion Abnormalities Appears new, continue to follow Follow up with PCP, recommend conservative management with his many comorbidities    CKD 4 -Baseline creatinine around 1.5, worse  in the setting of severe anemia, hypotension etc. -Now stable, IV fluids discontinued   Dysphagia -SLP evaluation ongoing, continue dysphagia diet   Multiple myeloma -On Revlimid at baseline, this is on hold in the setting of bacteremia/infection - instructed to discuss resumption with oncologist (ID ok if needed)   Hypokalemia -Replaced   Severe  protein calorie malnutrition -Add supplements as tolerated   Right Ankle Stage IV Decubitus  Right ankleOsteo  RUE Erosion Appreciate wound care recs Wound care as noted below Abx as above   Ethics -This is Jameeka Marcy chronically ill elderly male who is bedbound, right hemiplegia, pressure wounds with significant functional decline over the last 6 months to Devan Danzer year, multiple myeloma, H/o CVA w/ Hemiplegia, CKD, now with acute CVA, bacteremia, severe anemia, protein calorie malnutrition. -Prior hospitalist discussed overall poor prognosis with patient's daughter and spouse, recommended DNR and symptom/comfort focused care -Palliative consult appreciated, family requests full scope of care  Procedures: 8/14 TUNNELED CENTRAL VENOUS CATHETER  and picc removal 8/10 PICC placement by IR  8/6 PORT removal  Upper extremity US Summary:     Right:  Findings consistent with acute deep vein thrombosis involving the right  axillary  vein. Findings consistent with acute superficial vein thrombosis involving  the  right basilic vein.   Upper extremity US Summary:     Right:  Unable to assess Cambrea Kirt large portion of the venous system due to the patient's  positioning, and pain tolerance. Visualized portions appeared patent and  compressible.     Left:  No evidence of thrombosis in the subclavian.     Echo IMPRESSIONS     1. Left ventricular ejection fraction, by estimation, is 45 to 50%. The  left ventricle has mildly decreased function. The left ventricle  demonstrates regional wall motion abnormalities (see scoring  diagram/findings for description). There is mild left  ventricular hypertrophy. Left ventricular diastolic parameters are  indeterminate.   2. Right ventricular systolic function is normal. The right ventricular  size is normal.   3. Left atrial size was mildly dilated.   4. The mitral valve is normal in structure. Moderate mitral valve  regurgitation. No evidence of mitral  stenosis.   5. Tricuspid valve regurgitation is moderate.   6. The aortic valve is tricuspid. There is moderate calcification of the  aortic valve. There is moderate thickening of the aortic valve. Aortic  valve regurgitation is not visualized. Mild to moderate aortic valve  sclerosis/calcification is present,  without any evidence of aortic stenosis.   7. The inferior vena cava is normal in size with greater than 50%  respiratory variability, suggesting right atrial pressure of 3 mmHg.   Conclusion(s)/Recommendation(s): No intracardiac source of embolism  detected on this transthoracic study. Dylanie Quesenberry transesophageal echocardiogram is  recommended to exclude cardiac source of embolism if clinically indicated.   Carotid US Summary:  Right Carotid: Velocities in the right ICA are consistent with Keshayla Schrum 1-39%  stenosis.                 Non-hemodynamically significant plaque <50% noted in the  CCA. The                 ECA appears <50% stenosed.   Left Carotid: Velocities in the left ICA are consistent with Jassmin Kemmerer 80-99%  stenosis.                Non-hemodynamically significant plaque <50% noted in the  CCA. The  ECA appears <50% stenosed.   Vertebrals:  Right vertebral artery demonstrates antegrade flow. Left  vertebral               artery demonstrates high resistant flow with loss of  diastolic               flow.  Subclavians: Normal flow hemodynamics were seen in the right subclavian  artery.               Abnormal flow seen in left subclavian artery (LUE AVF).   Consultations: Neurology Palliative ID  Discharge Exam: Vitals:   04/03/21 0728 04/03/21 1140  BP: 132/67 (!) 145/62  Pulse: 76 84  Resp: 16 17  Temp: 98.5 F (36.9 C) 98.4 F (36.9 C)  SpO2: 97% 97%   Long discussion with daughter, wife regarding discharge recommendations and considerations  General: No acute distress. Cardiovascular: RRR Lungs: unlabored Abdomen: Soft, nontender, nondistended   Neurological: difficult to understand - R sided weakness Extremities: prafo boots, dressing in place to RLE and RUE - RUE edema  Discharge Instructions   Discharge Instructions     [facility] pharmacist to adjust dose for Vancomycin, Aminoglycosides and other anti-infective therapies as requested by physician.   Complete by: As directed    [facility] to provide Cath Flo 67m   Complete by: As directed    Administer for PICC line occlusion and as ordered by physician for other access device issues.   Amb Referral to Palliative Care   Complete by: As directed    Ambulatory referral to Neurology   Complete by: As directed    An appointment is requested in approximately: 4 weeks   Anaphylaxis Kit: Provided to treat any anaphylactic reaction to the medication being provided to the patient if First Dose or when requested by physician   Complete by: As directed    Epinephrine 114mml vial / amp: Administer 0.56m30m0.56ml81mubcutaneously once for moderate to severe anaphylaxis, nurse to call physician and pharmacy when reaction occurs and call 911 if needed for immediate care   Diphenhydramine 50mg15mIV vial: Administer 25-50mg 76mM PRN for first dose reaction, rash, itching, mild reaction, nurse to call physician and pharmacy when reaction occurs   Sodium Chloride 0.9% NS 500ml I26mdminister if needed for hypovolemic blood pressure drop or as ordered by physician after call to physician with anaphylactic reaction   Call MD for:  difficulty breathing, headache or visual disturbances   Complete by: As directed    Call MD for:  extreme fatigue   Complete by: As directed    Call MD for:  hives   Complete by: As directed    Call MD for:  persistant dizziness or light-headedness   Complete by: As directed    Call MD for:  persistant nausea and vomiting   Complete by: As directed    Call MD for:  redness, tenderness, or signs of infection (pain, swelling, redness, odor or  green/yellow discharge around incision site)   Complete by: As directed    Call MD for:  severe uncontrolled pain   Complete by: As directed    Call MD for:  temperature >100.4   Complete by: As directed    Change dressing on IV access line weekly and PRN   Complete by: As directed    DIET DYS 3   Complete by: As directed    Fluid consistency: Thin   Discharge instructions   Complete by: As directed  You have been seen for bacteria in your blood related to your ankle wound.  You'll need 6 weeks of IV antibiotics.  Please follow up with your PCP and infectious disease regarding whether you'll need antibiotics after this.  You'll have weekly labs with CBC, CMP, and CRP with your IV antibiotics.  Continue wound care at home.  You have several pressure wounds which will need close management at home.  You had Kylor Valverde stroke with think was related to blood pressure and narrow blood vessels.  Please follow up with neurology outpatient as appropriate based on your goals of care discussions.  Your echo showed Seirra Kos slightly decreased pump (ejection fraction) of the heart and wall motion abnormality (possible indication of past heart attack?).  I think in the setting of everything going on, I'd recommend follow up with your PCP and conservative management.   You have low blood counts.  You have Alexus Michael DVT (blood clot), but I think the risks of Avianah Pellman blood thinner outweigh the benefit.  Follow your blood counts (you'll have weekly labs with your antibiotics).  Follow with your oncologist about your myeloma and whether you should resume your medicine.    You have several serious medical issues and are chronically ill.  Several providers have recommended considering Traven Davids more comfort focused route and I think this is reasonable to consider given your serious illness and comorbidities.  I'll recommend palliative care see you as an outpatient.  Return for new, recurrent, or worsening symptoms.  Please ask your PCP to  request records from this hospitalization so they know what was done and what the next steps will be.   Discharge wound care:   Complete by: As directed    Apply Santyl to right ankle wound every day, then cover with moist gauze and foam dressing.  (Change foam dressing every 3 days or as needed for soiling.)   Discharge wound care:   Complete by: As directed    Single layer of xeroform gauze over open area right arm wound, top with dry dressings. Change every other day.  Ok to teach patient or family to change every day. Send home supplies for Graden Hoshino few dressing changes; can be purchased over the counter.   Flush IV access with Sodium Chloride 0.9% and Heparin 10 units/ml or 100 units/ml   Complete by: As directed    Home infusion instructions - [facility]   Complete by: As directed    Instructions: Flush IV access with Sodium Chloride 0.9% and Heparin 10units/ml or 100units/ml   Change dressing on IV access line: Weekly and PRN   Instructions Cath Flo 24m: Administer for PICC Line occlusion and as ordered by physician for other access device   [facility] pharmacist to adjust dose for: Vancomycin, Aminoglycosides and other anti-infective therapies as requested by physician   Increase activity slowly   Complete by: As directed    Method of administration may be changed at the discretion of home infusion pharmacist based upon assessment of the patient and/or caregiver's ability to self-administer the medication ordered   Complete by: As directed       Allergies as of 04/03/2021   No Known Allergies      Medication List     STOP taking these medications    apixaban 5 MG Tabs tablet Commonly known as: ELIQUIS   atorvastatin 10 MG tablet Commonly known as: LIPITOR   dexamethasone 4 MG tablet Commonly known as: DECADRON   furosemide 40 MG  tablet Commonly known as: LASIX   lenalidomide 15 MG capsule Commonly known as: REVLIMID   traMADol 50 MG  tablet Commonly known as: ULTRAM       TAKE these medications    acetaminophen 160 MG/5ML solution Commonly known as: TYLENOL Take 20.3 mLs (650 mg total) by mouth every 6 (six) hours as needed for mild pain or fever.   calcitRIOL 0.25 MCG capsule Commonly known as: ROCALTROL Take 1 capsule by mouth every other day.   ceFAZolin  IVPB Commonly known as: ANCEF Inject 2 g into the vein every 8 (eight) hours. Indication:  MSSA bacteremia and osteomyelitis First Dose: Yes Last Day of Therapy:  05/03/21 Labs - Once weekly:  CBC/D and BMP, Labs - Every other week:  ESR and CRP Method of administration: IV Push Method of administration may be changed at the discretion of home infusion pharmacist based upon assessment of the patient and/or caregiver's ability to self-administer the medication ordered.   collagenase ointment Commonly known as: SANTYL Apply topically daily. Apply santyl to right ankle wound daily then cover with moist gauze and foam dressing.  Change foam dressing every 3 days or as needed for soiling. Start taking on: April 04, 2021   cyanocobalamin 1000 MCG tablet Take 1 tablet (1,000 mcg total) by mouth daily. Start taking on: April 04, 2021   ferrous sulfate 325 (65 FE) MG tablet Take 1 tablet (325 mg total) by mouth daily with breakfast. Start taking on: April 04, 2021   metoprolol tartrate 25 MG tablet Commonly known as: LOPRESSOR Take 1 tablet (25 mg total) by mouth 2 (two) times daily.   OVER THE COUNTER MEDICATION Take 1 tablet by mouth 2 (two) times daily. Citcrical 1200 mg tab. One in the Am one in the pm.   oxyCODONE 5 MG immediate release tablet Commonly known as: Roxicodone Take 1 tablet (5 mg total) by mouth every 6 (six) hours as needed for up to 5 days.   pantoprazole 40 MG tablet Commonly known as: PROTONIX Take 1 tablet (40 mg total) by mouth 2 (two) times daily.   polyethylene glycol 17 g packet Commonly known as: MIRALAX /  GLYCOLAX Take 17 g by mouth daily as needed for mild constipation.   thiamine 100 MG tablet Take 1 tablet (100 mg total) by mouth daily. Start taking on: April 04, 2021       ASK your doctor about these medications    Darbepoetin Alfa 200 MCG/0.4ML Sosy injection Commonly known as: ARANESP Inject 0.4 mLs (200 mcg total) into the vein every Tuesday with hemodialysis.               Durable Medical Equipment  (From admission, onward)           Start     Ordered   04/02/21 1603  For home use only DME Other see comment  Once       Comments: hoyer  Question:  Length of Need  Answer:  Lifetime   04/02/21 1603              Discharge Care Instructions  (From admission, onward)           Start     Ordered   04/03/21 0000  Change dressing on IV access line weekly and PRN  (Home infusion instructions - [facility] )        04/03/21 1342   04/03/21 0000  Discharge wound care:  Comments: Apply Santyl to right ankle wound every day, then cover with moist gauze and foam dressing.  (Change foam dressing every 3 days or as needed for soiling.)   04/03/21 1342   04/03/21 0000  Discharge wound care:       Comments: Single layer of xeroform gauze over open area right arm wound, top with dry dressings. Change every other day.  Ok to teach patient or family to change every day. Send home supplies for Dedra Matsuo few dressing changes; can be purchased over the counter.   04/03/21 1712           No Known Allergies    The results of significant diagnostics from this hospitalization (including imaging, microbiology, ancillary and laboratory) are listed below for reference.    Significant Diagnostic Studies: DG Ankle Complete Right  Result Date: 03/20/2021 CLINICAL DATA:  Questionable sepsis EXAM: RIGHT ANKLE - COMPLETE 3+ VIEW COMPARISON:  None. FINDINGS: No fracture or dislocation of the right ankle. Mild ankle mortise arthrosis with Isom Kochan chronic, posttraumatic  ossicle of the medial malleolar tip. Vascular calcinosis. IMPRESSION: No fracture or dislocation of the right ankle. Mild ankle mortise arthrosis with Burnetta Kohls chronic, posttraumatic ossicle of the medial malleolar tip. Electronically Signed   By: Eddie Candle M.D.   On: 03/20/2021 14:20   CT HEAD WO CONTRAST (5MM)  Result Date: 03/20/2021 CLINICAL DATA:  Neurological deficit EXAM: CT HEAD WITHOUT CONTRAST TECHNIQUE: Contiguous axial images were obtained from the base of the skull through the vertex without intravenous contrast. COMPARISON:  CT head dated February 19, 2021 FINDINGS: Brain: No evidence of acute infarction, hemorrhage, hydrocephalus, extra-axial collection or mass lesion/mass effect. Chronic white matter ischemic change. Old left basal ganglia lacunar infarcts. Vascular: No hyperdense vessel or unexpected calcification. Skull: Normal. Negative for fracture or focal lesion. Sinuses/Orbits: Fluid fluid level seen in the left sphenoid sinuses. Other: None. IMPRESSION: No acute intracranial abnormality. Fluid fluid level seen in the left sphenoid sinuses, findings can be seen in setting of sinusitis. Electronically Signed   By: Yetta Glassman MD   On: 03/20/2021 15:22   MR ANGIO HEAD WO CONTRAST  Result Date: 03/22/2021 CLINICAL DATA:  Stroke follow-up EXAM: MRA HEAD WITHOUT CONTRAST TECHNIQUE: Angiographic images of the Circle of Willis were acquired using MRA technique without intravenous contrast. COMPARISON:  No pertinent prior exam. FINDINGS: POSTERIOR CIRCULATION: --Vertebral arteries: Both are diminutive. There is loss of flow related enhancement of both vertebral arteries proximal to the vertebrobasilar confluence. --Inferior cerebellar arteries: Normal. --Basilar artery: Diminutive with diffuse irregularity. --Superior cerebellar arteries: Normal on the left. Multifocal stenosis of the right. --Posterior cerebral arteries: Both have fetal type origins from the posterior communicating arteries.  Otherwise normal. ANTERIOR CIRCULATION: --Intracranial internal carotid arteries: The left ICA is occluded. Right ICA is unremarkable. --Anterior cerebral arteries (ACA): Normal. --Middle cerebral arteries (MCA): Normal. ANATOMIC VARIANTS: Fetal origins of both PCAs IMPRESSION: 1. 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 2. 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 3. Diminutive and irregular basilar artery in the context of bilateral fetal type origins of the posterior cerebral arteries. Electronically Signed   By: Ulyses Jarred M.D.   On: 03/22/2021 03:36   MR BRAIN WO CONTRAST  Result Date: 03/20/2021 CLINICAL DATA:  Transient ischemic attack. History of multiple myeloma. EXAM: MRI HEAD WITHOUT CONTRAST TECHNIQUE: Multiplanar, multiecho pulse sequences of the brain and surrounding structures were obtained without intravenous contrast. COMPARISON:  01/10/2020  FINDINGS: Brain: Multifocal acute ischemia in Regis Wiland watershed distribution in the left hemisphere. No contralateral acute ischemia. No acute hemorrhage. No chronic microhemorrhage. 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 Mild cytotoxic edema associated with the acute insults. The midline structures are normal. Vascular: Major flow voids are preserved. Skull and upper cervical spine: Normal calvarium and skull base. Visualized upper cervical spine and soft tissues are normal. Sinuses/Orbits:No paranasal sinus fluid levels or advanced mucosal thickening. No mastoid or middle ear effusion. Normal orbits. IMPRESSION: 1. Multifocal acute ischemia in Elyanna Wallick watershed distribution in the left hemisphere. No hemorrhage or mass effect. 2. 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 Electronically Signed   By: Ulyses Jarred M.D.   On: 03/20/2021 23:26   IR REMOVAL TUN ACCESS W/ PORT W/O FL MOD SED  Result Date: 03/24/2021 CLINICAL DATA:   80 year old male with history of stroke and multiple myeloma, currently admitted with bacteremia. Port removal requested for central venous line holiday. EXAM: REMOVAL OF IMPLANTED TUNNELED PORT-Taavi Hoose-CATH MEDICATIONS: None. ANESTHESIA/SEDATION: Moderate (conscious) sedation was employed during this procedure. Jaze Rodino total of Versed 0.5 mg and Fentanyl 25 mcg was administered intravenously. Moderate Sedation Time: 10 minutes. The patient's level of consciousness and vital signs were monitored continuously by radiology nursing throughout the procedure under my direct supervision. FLUOROSCOPY TIME:  None PROCEDURE: Informed written consent was obtained from the patient after Emary Zalar discussion of the risk, benefits and alternatives to the procedure. The patient was positioned supine on the fluoroscopy table and the right chest Port-Emersyn Kotarski-Cath site was prepped with chlorhexidine. Rexanna Louthan sterile gown and gloves were worn during the procedure. Local anesthesia was provided with 1% lidocaine with epinephrine. Jadamarie Butson timeout was performed prior to the initiation of the procedure. An incision was made overlying the Port-Detrich Rakestraw-Cath with Antoinette Borgwardt #15 scalpel. Utilizing sharp and blunt dissection, the Port-Hymen Arnett-Cath was removed completely. The pocked was irrigated with sterile saline. Wound closure was performed with interrupted deep dermal 3 0 Vicryl sutures and Dermabond. Sarahanne Novakowski dressing was placed. The patient tolerated the procedure well without immediate post procedural complication. FINDINGS: Successful removal of implant Port-Melodye Swor-Cath without immediate post procedural complication. IMPRESSION: Successful removal of implanted Port-Halynn Reitano-Cath. Ruthann Cancer, MD [facility] Electronically Signed   By: Ruthann Cancer MD   On: 03/24/2021 12:10   IR US Guide Vasc Access Right  INDICATION: 80 year old male referred for removal of Cyrilla Durkin right upper extremity PICC secondary to catheter associated thrombus, and placement of  tunneled central venous catheter.   The patient has Radek Carnero left upper extremity dialysis fistula.   EXAM: TUNNELED CENTRAL VENOUS CATHETER   MEDICATIONS: None   ANESTHESIA/SEDATION: None   FLUOROSCOPY TIME:  Fluoroscopy Time: 2 minutes 0 seconds   COMPLICATIONS: None   PROCEDURE: After written informed consent was obtained, patient was placed in the supine position on angiographic table.   Patency of the right internal jugular vein was confirmed with ultrasound with image documentation.   Patient was prepped and draped in the usual sterile fashion including the right neck and right superior chest.   Using ultrasound guidance, the skin and subcutaneous tissues overlying the right internal jugular vein were generously infiltrated with 1% lidocaine without epinephrine. Using ultrasound guidance, the right internal jugular vein was punctured with Uriah Philipson micropuncture needle, and an 018 wire was advanced into the right heart confirming venous access. Ashtan Laton small stab incision was made with an 11 blade scalpel.   Peel-away sheath was placed over the  wire, and then the wire was removed, marking the wire for estimation of internal catheter length.   The chest wall was then generously infiltrated with 1% lidocaine for local anesthesia along the tissue tract. Small stab incision was made with 11 blade scalpel, and then the catheter was back tunneled to the puncture site at the right internal jugular vein.   Catheter was pulled through the tract, with the catheter amputated at 24 cm.   Catheter was advanced through the peel-away sheath, and the peel-away sheath was removed.   Final image was stored.   The catheter was anchored to the chest wall with 2 retention sutures, and Derma bond was used to seal the right internal jugular vein incision site and at the right chest wall.   Patient tolerated the procedure well and remained hemodynamically stable throughout.   No complications were encountered and no significant blood loss was  encountered.   IMPRESSION: Status post right IJ tunneled cuffed central venous catheter.   Signed,   Dulcy Fanny. Dellia Nims, RPVI   [facility]   [facility]     Electronically Signed   By: Corrie Mckusick D.O.   On: 04/01/2021 12:24    DG Chest Port 1 View  Result Date: 03/20/2021 CLINICAL DATA:  Possible sepsis. EXAM: PORTABLE CHEST 1 VIEW COMPARISON:  Single-view of the chest 02/19/2021. FINDINGS: Right IJ approach Port-Batsheva Stevick-Cath is unchanged. Lungs clear. Heart size is normal. Aortic atherosclerosis. No pneumothorax or pleural fluid. No acute bony abnormality. Right fifth rib fracture again seen. IMPRESSION: No acute disease. Aortic Atherosclerosis (ICD10-I70.0). Electronically Signed   By: Inge Rise M.D.   On: 03/20/2021 14:19   ECHOCARDIOGRAM COMPLETE BUBBLE STUDY  Result Date: 03/21/2021    ECHOCARDIOGRAM REPORT   Patient Name:   EARLIN SWEEDEN Buda Date of Exam: 03/21/2021 Medical Rec #:  [id number]                   Height:       75.0 in Accession #:    [id number]                  Weight:       167.3 lb Date of Birth:  [birth year]                   BSA:          2.034 m Patient Age:    28 years                    BP:           82/68 mmHg Patient Gender: M                           HR:           95 bpm. Exam Location:  Inpatient Procedure: 2D Echo, Cardiac Doppler and Color Doppler Indications:    Stroke  History:        Patient has prior history of Echocardiogram examinations, most                 recent 01/15/2019. COPD; Signs/Symptoms:Altered Mental Status.  Sonographer:    Merrie Roof RDCS Referring Phys: [id number] ERIC CHEN  Sonographer Comments: Suboptimal apical window. Restricted mobility. No peripheral IV so no contrast and no bubble was performed at this time IMPRESSIONS  1. Left ventricular ejection fraction, by estimation, is 45 to 50%.  The left ventricle has mildly decreased function. The left ventricle demonstrates regional wall motion  abnormalities (see scoring diagram/findings for description). There is mild left ventricular hypertrophy. Left ventricular diastolic parameters are indeterminate.  2. Right ventricular systolic function is normal. The right ventricular size is normal.  3. Left atrial size was mildly dilated.  4. The mitral valve is normal in structure. Moderate mitral valve regurgitation. No evidence of mitral stenosis.  5. Tricuspid valve regurgitation is moderate.  6. The aortic valve is tricuspid. There is moderate calcification of the aortic valve. There is moderate thickening of the aortic valve. Aortic valve regurgitation is not visualized. Mild to moderate aortic valve sclerosis/calcification is present, without any evidence of aortic stenosis.  7. The inferior vena cava is normal in size with greater than 50% respiratory variability, suggesting right atrial pressure of 3 mmHg. Conclusion(s)/Recommendation(s): No intracardiac source of embolism detected on this transthoracic study. Haliyah Fryman transesophageal echocardiogram is recommended to exclude cardiac source of embolism if clinically indicated. FINDINGS  Left Ventricle: Left ventricular ejection fraction, by estimation, is 45 to 50%. The left ventricle has mildly decreased function. The left ventricle demonstrates regional wall motion abnormalities. The left ventricular internal cavity size was normal in size. There is mild left ventricular hypertrophy. Left ventricular diastolic parameters are indeterminate.  LV Wall Scoring: The apical anterior segment and apex are akinetic. The mid anterolateral segment and mid anterior segment are hypokinetic. Right Ventricle: The right ventricular size is normal. No increase in right ventricular wall thickness. Right ventricular systolic function is normal. Left Atrium: Left atrial size was mildly dilated. Right Atrium: Right atrial size was normal in size. Pericardium: There is no evidence of pericardial effusion. Mitral Valve: The mitral  valve is normal in structure. Moderate mitral valve regurgitation. No evidence of mitral valve stenosis. Tricuspid Valve: The tricuspid valve is normal in structure. Tricuspid valve regurgitation is moderate . No evidence of tricuspid stenosis. Aortic Valve: The aortic valve is tricuspid. There is moderate calcification of the aortic valve. There is moderate thickening of the aortic valve. Aortic valve regurgitation is not visualized. Mild to moderate aortic valve sclerosis/calcification is present, without any evidence of aortic stenosis. Pulmonic Valve: The pulmonic valve was normal in structure. Pulmonic valve regurgitation is not visualized. No evidence of pulmonic stenosis. Aorta: The aortic root is normal in size and structure. Venous: The inferior vena cava is normal in size with greater than 50% respiratory variability, suggesting right atrial pressure of 3 mmHg. IAS/Shunts: No atrial level shunt detected by color flow Doppler.  LEFT VENTRICLE PLAX 2D LVIDd:         4.80 cm LVIDs:         3.60 cm LV PW:         1.20 cm LV IVS:        1.00 cm LVOT diam:     2.00 cm LV SV:         43 LV SV Index:   21 LVOT Area:     3.14 cm  RIGHT VENTRICLE RV Basal diam:  3.40 cm LEFT ATRIUM             Index       RIGHT ATRIUM           Index LA diam:        5.00 cm 2.46 cm/m  RA Area:     17.10 cm LA Vol (A2C):   72.5 ml 35.64 ml/m RA Volume:   42.40 ml  20.84  ml/m LA Vol (A4C):   82.5 ml 40.56 ml/m LA Biplane Vol: 84.2 ml 41.39 ml/m  AORTIC VALVE LVOT Vmax:   82.80 cm/s LVOT Vmean:  54.500 cm/s LVOT VTI:    0.137 m  AORTA Ao Root diam: 3.30 cm MITRAL VALVE MV Area (PHT): 7.02 cm    SHUNTS MV Decel Time: 108 msec    Systemic VTI:  0.14 m MV E velocity: 81.80 cm/s  Systemic Diam: 2.00 cm MV Ciela Mahajan velocity: 70.70 cm/s MV E/Irma Delancey ratio:  1.16 Candee Furbish MD Electronically signed by Candee Furbish MD Signature Date/Time: 03/21/2021/6:35:14 PM    Final    VAS US CAROTID  Result Date: 03/21/2021 Carotid Arterial Duplex Study  Patient Name:  JAMESEN STAHNKE Trunnell  Date of Exam:   03/21/2021 Medical Rec #: [id number]                    Accession #:    [id number] Date of Birth: 09/18/1940                    Patient Gender: M Patient Age:   80Y Exam Location:  [facility] Procedure:      VAS US CAROTID Referring Phys: [id number] ERIC CHEN --------------------------------------------------------------------------------  Indications:       Speech disturbance. Risk Factors:      Past history of smoking, prior CVA. 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 Comparison Study:  No previous exams Performing Technologist: Jody Hill RVT, RDMS  Examination Guidelines: Yosef Krogh complete evaluation includes B-mode imaging, spectral Doppler, color Doppler, and power Doppler as needed of all accessible portions of each vessel. Bilateral testing is considered an integral part of Aynsley Fleet complete examination. Limited examinations for reoccurring indications may be performed as noted.  Right Carotid Findings: +----------+-------+--------+--------+-----------------------+-----------------+           PSV    EDV cm/sStenosisPlaque Description     Comments                    cm/s                                                            +----------+-------+--------+--------+-----------------------+-----------------+ CCA Prox  57     18              heterogenous           intimal                                                                   thickening        +----------+-------+--------+--------+-----------------------+-----------------+ CCA Distal44     12              heterogenous and       intimal  calcific               thickening        +----------+-------+--------+--------+-----------------------+-----------------+ ICA Prox  85     29      1-39%   calcific and irregular                    +----------+-------+--------+--------+-----------------------+-----------------+ ICA Distal56     19                                                       +----------+-------+--------+--------+-----------------------+-----------------+ ECA       56     0               irregular and calcific                   +----------+-------+--------+--------+-----------------------+-----------------+ +----------+--------+-------+----------------+-------------------+           PSV cm/sEDV cmsDescribe        Arm Pressure (mmHG) +----------+--------+-------+----------------+-------------------+ Subclavian30             Multiphasic, WNL                    +----------+--------+-------+----------------+-------------------+ +---------+--------+--+--------+-+---------+ VertebralPSV cm/s21EDV cm/s8Antegrade +---------+--------+--+--------+-+---------+  Left Carotid Findings: +----------+--------+--------+--------+---------------------+------------------+           PSV cm/sEDV cm/sStenosisPlaque Description   Comments           +----------+--------+--------+--------+---------------------+------------------+ CCA Prox  30      0               calcific             intimal thickening +----------+--------+--------+--------+---------------------+------------------+ CCA Distal27      0               calcific and         intimal thickening                                   heterogenous                            +----------+--------+--------+--------+---------------------+------------------+ ICA Prox  269     120     80-99%  diffuse, calcific and                                                     heterogenous                            +----------+--------+--------+--------+---------------------+------------------+ ICA Mid   41      21                                                       +----------+--------+--------+--------+---------------------+------------------+ ICA Distal35      17                                                      +----------+--------+--------+--------+---------------------+------------------+  ECA       95      0               calcific                                +----------+--------+--------+--------+---------------------+------------------+ +----------+--------+--------+------------------+-------------------+           PSV cm/sEDV cm/sDescribe          Arm Pressure (mmHG) +----------+--------+--------+------------------+-------------------+ Subclavian100             Abnormal (LUE AVF)                    +----------+--------+--------+------------------+-------------------+ +---------+--------+--+--------+-+--------------------------------------------+ VertebralPSV cm/s23EDV cm/s0High resistant and with loss of diastolic                                flow                                         +---------+--------+--+--------+-+--------------------------------------------+   Summary: Right Carotid: Velocities in the right ICA are consistent with Roopa Graver 1-39% stenosis.                Non-hemodynamically significant plaque <50% noted in the CCA. The                ECA appears <50% stenosed. 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               Non-hemodynamically significant plaque <50% noted in the CCA. The               ECA appears <50% stenosed. Vertebrals:  Right vertebral artery demonstrates antegrade flow. Left vertebral              artery demonstrates high resistant flow with loss of diastolic              flow. Subclavians: Normal flow hemodynamics were seen in the right subclavian artery.              Abnormal flow seen in left subclavian artery (LUE AVF). *See table(s) above for measurements and observations.  Vascular consult recommended. Electronically signed by Harold Barban MD on  03/21/2021 at 11:54:15 AM.    Final    VAS [place] UPPER EXTREMITY VENOUS DUPLEX  Result Date: 03/31/2021 UPPER VENOUS STUDY  Patient Name:  ALOYS HUPFER Granberry  Date of Exam:   03/29/2021 Medical Rec #: [id number]                    Accession #:    [id number] Date of Birth: Aug 30, 1940                    Patient Gender: M Patient Age:   43 years Exam Location:  [facility] Procedure:      VAS [place] UPPER EXTREMITY VENOUS DUPLEX Referring Phys: Delquan Poucher POWELL JR --------------------------------------------------------------------------------  Indications: Worsening swelling, Eliquis on hold, PICC right arm Limitations: Patient tissue properties, limited mobility and pain tolerance. Comparison Study: 03-26-2021 Right upper extremity venous was severely limited                   due to patient positioning and pain tolerance. Visualized  portions were negative for DVT. Performing Technologist: Maudry Mayhew RDMS, RVT, RDCS Supporting Technologist: Darlin Coco RDMS, RVT  Examination Guidelines: Kimmora Risenhoover complete evaluation includes B-mode imaging, spectral Doppler, color Doppler, and power Doppler as needed of all accessible portions of each vessel. Bilateral testing is considered an integral part of Jonte Wollam complete examination. Limited examinations for reoccurring indications may be performed as noted.  Right Findings: +--------+------------+---------+-----------+----------+-------+ RIGHT   CompressiblePhasicitySpontaneousPropertiesSummary +--------+------------+---------+-----------+----------+-------+ Axillary    None       No        No                Acute  +--------+------------+---------+-----------+----------+-------+ Brachial    Full       Yes       Yes                      +--------+------------+---------+-----------+----------+-------+ Radial      Full                                          +--------+------------+---------+-----------+----------+-------+ Ulnar        Full                                          +--------+------------+---------+-----------+----------+-------+ Cephalic    Full                                          +--------+------------+---------+-----------+----------+-------+ Basilic   Partial      Yes       Yes               Acute  +--------+------------+---------+-----------+----------+-------+  Summary:  Right: Findings consistent with acute deep vein thrombosis involving the right axillary vein. Findings consistent with acute superficial vein thrombosis involving the right basilic vein.  *See table(s) above for measurements and observations.  Diagnosing physician: Harold Barban MD Electronically signed by Harold Barban MD on 03/31/2021 at 1:27:22 PM.    Final    VAS [place] UPPER EXTREMITY VENOUS DUPLEX  Result Date: 03/27/2021 UPPER VENOUS STUDY  Patient Name:  ABDIKADIR FOHL Buffin  Date of Exam:   03/27/2021 Medical Rec #: [id number]                    Accession #:    [id number] Date of Birth: 07-04-1941                    Patient Gender: M Patient Age:   55 years Exam Location:  [facility] Procedure:      VAS [place] UPPER EXTREMITY VENOUS DUPLEX Referring Phys: Domenic Polite --------------------------------------------------------------------------------  Indications: Swelling, and Pain Risk Factors: None identified. Limitations: Poor ultrasound/tissue interface, bandages, line and patient positioning, patient immobility, patient pain tolerance. Comparison Study: No prior studies. Performing Technologist: Oliver Hum RVT  Examination Guidelines: Jomes Giraldo complete evaluation includes B-mode imaging, spectral Doppler, color Doppler, and power Doppler as needed of all accessible portions of each vessel. Bilateral testing is considered an integral part of Airika Alkhatib complete examination. Limited examinations for reoccurring indications may be performed as noted.  Right Findings:  +----------+------------+---------+-----------+----------+--------------+ RIGHT     CompressiblePhasicitySpontaneousProperties   Summary     +----------+------------+---------+-----------+----------+--------------+  IJV           Full       Yes       Yes                             +----------+------------+---------+-----------+----------+--------------+ Subclavian    Full       Yes       Yes                             +----------+------------+---------+-----------+----------+--------------+ Axillary      Full       Yes       Yes                             +----------+------------+---------+-----------+----------+--------------+ Brachial                                            Not visualized +----------+------------+---------+-----------+----------+--------------+ Radial                                              Not visualized +----------+------------+---------+-----------+----------+--------------+ Ulnar                                               Not visualized +----------+------------+---------+-----------+----------+--------------+ Cephalic      Full                                                 +----------+------------+---------+-----------+----------+--------------+ Basilic                                             Not visualized +----------+------------+---------+-----------+----------+--------------+  Left Findings: +----------+------------+---------+-----------+----------+-------+ LEFT      CompressiblePhasicitySpontaneousPropertiesSummary +----------+------------+---------+-----------+----------+-------+ Subclavian    Full       Yes       Yes                      +----------+------------+---------+-----------+----------+-------+  Summary:  Right: Unable to assess Ryshawn Sanzone large portion of the venous system due to the patient's positioning, and pain tolerance. Visualized portions appeared patent and compressible.  Left: No  evidence of thrombosis in the subclavian.  *See table(s) above for measurements and observations.  Diagnosing physician: [name] MD Electronically signed by [name] MD on 03/27/2021 at 5:30:08 PM.    Final    IR PICC PLACEMENT LEFT >5 YRS INC IMG GUIDE  Result Date: 03/28/2021 INDICATION: Right ankle osteomyelitis, in need of long-term IV antibiotics. Request for PICC line placement. EXAM: ULTRASOUND AND FLUOROSCOPIC GUIDED PICC LINE INSERTION MEDICATIONS: 1% lidocaine CONTRAST:  None FLUOROSCOPY TIME:  18 seconds (1 mGy) COMPLICATIONS: None immediate. TECHNIQUE: The procedure, risks, benefits, and alternatives were explained to the patient and informed written consent was obtained. The right upper extremity  was prepped with chlorhexidine in Sidni Fusco sterile fashion, and Antero Derosia sterile drape was applied covering the operative field. Maximum barrier sterile technique with sterile gowns and gloves were used for the procedure. Honor Fairbank timeout was performed prior to the initiation of the procedure. Local anesthesia was provided with 1% lidocaine. After the overlying soft tissues were anesthetized with 1% lidocaine, Morningstar Toft micropuncture kit was utilized to access the right brachial vein. Real-time ultrasound guidance was utilized for vascular access including the acquisition of Gardiner Espana permanent ultrasound image documenting patency of the accessed vessel. Cynthya Yam guidewire was advanced to the level of the superior caval-atrial junction for measurement purposes and the PICC line was cut to length. Abiageal Blowe peel-away sheath was placed and Chaquita Basques 37 cm, 5 [place], dual lumen was inserted to level of the superior caval-atrial junction. Kindra Bickham post procedure spot fluoroscopic was obtained. The catheter easily aspirated and flushed and was secured in place. Jamell Laymon dressing was placed. The patient tolerated the procedure well without immediate post procedural complication. FINDINGS: After catheter placement, the tip lies within the superior cavoatrial junction. The  catheter aspirates and flushes normally and is ready for immediate use. IMPRESSION: Successful ultrasound and fluoroscopic guided placement of Maximilien Hayashi right brachial vein approach, 37 cm, 5 French, dual lumen PICC with tip at the superior caval-atrial junction. The PICC line is ready for immediate use. Read by: Durenda Guthrie, PA-C Electronically Signed   By: Markus Daft M.D.   On: 03/28/2021 12:02   [place] EKG SITE RITE  Result Date: 03/26/2021 If Site Rite image not attached, placement could not be confirmed due to current cardiac rhythm.   Microbiology: No results found for this or any previous visit (from the past 240 hour(s)).   Labs: Basic Metabolic Panel: Recent Labs  Lab 03/30/21 0305 03/31/21 0201 04/01/21 0438 04/02/21 0653 04/03/21 0430  NA 135 133* 135 139 138  K 3.8 3.7 3.6 3.9 3.7  CL 106 105 106 110 109  CO2 '25 22 22 24 23  ' GLUCOSE 91 89 122* 97 91  BUN 22 24* 28* 27* 32*  CREATININE 1.40* 1.38* 1.36* 1.20 1.23  CALCIUM 7.7* 7.6* 7.7* 7.8* 8.0*  MG 2.2 2.0 2.0 1.9 1.9  PHOS 2.8 3.1 2.9 2.6 2.6   Liver Function Tests: Recent Labs  Lab 03/30/21 0305 03/31/21 0201 04/01/21 0438 04/02/21 0653 04/03/21 0430  AST '16 20 21 18 17  ' ALT <5 <5 <5 <5 <5  ALKPHOS 65 61 77 60 58  BILITOT 0.5 0.4 0.2* 0.4 0.6  PROT 4.2* 4.3* 4.4* 4.4* 4.7*  ALBUMIN 1.4* 1.4* 1.4* 1.4* 1.4*   No results for input(s): LIPASE, AMYLASE in the last 168 hours. No results for input(s): AMMONIA in the last 168 hours. CBC: Recent Labs  Lab 03/29/21 0022 03/30/21 0305 03/31/21 0201 03/31/21 1354 04/01/21 0438 04/02/21 0653 04/03/21 0430  WBC 4.4 4.7 4.9  --  5.2 5.7 5.7  NEUTROABS 3.3 3.6 3.8  --  4.1  --  4.6  HGB 7.6* 7.5* 7.1* 7.4* 7.1* 7.0* 8.0*  HCT 25.1* 24.5* 23.1* 23.5* 23.0* 22.8* 25.8*  MCV 98.8 97.6 97.1  --  97.0 98.3 97.0  PLT 208 241 270  --  301 301 285   Cardiac Enzymes: No results for input(s): CKTOTAL, CKMB, CKMBINDEX, TROPONINI in the last 168 hours. BNP: BNP (last 3  results) Recent Labs    03/20/21 1238  BNP 2,196.0*    ProBNP (last 3 results) No results for input(s): PROBNP in the last 8760 hours.  CBG: No results for input(s): GLUCAP in the last 168 hours.     Signed:  Fayrene Helper MD.  [facility] 04/03/2021, 6:53 PM

## 2021-04-05 ENCOUNTER — Telehealth: Payer: Self-pay

## 2021-04-05 NOTE — Telephone Encounter (Signed)
Spoke with patient's wife [name] and scheduled an in-person Palliative Consult for 04/24/21 @ 11:30AM with Dr. Hollace Kinnier. Documentation will be noted in [facility].   COVID screening was negative. No pets in home. Patient lives with wife.  Consent obtained; updated Outlook/Netsmart/Team List and Epic.   Family is aware they may be receiving a call from provider the day before or day of to confirm appointment.

## 2021-04-06 ENCOUNTER — Inpatient Hospital Stay: Payer: Medicare Other | Admitting: Internal Medicine

## 2021-04-09 ENCOUNTER — Telehealth: Payer: Self-pay | Admitting: Adult Health

## 2021-04-09 NOTE — Telephone Encounter (Signed)
Pt;'s daughter Dellie Catholic called, he has passed away.

## 2021-04-13 ENCOUNTER — Inpatient Hospital Stay: Payer: Medicare Other | Admitting: Internal Medicine

## 2021-04-17 NOTE — Progress Notes (Signed)
   03/21/21 2017  Assess: MEWS Score  Temp 97.6 F (36.4 C)  BP (!) 80/46  Pulse Rate 86  Resp 20  SpO2 97 %  Assess: MEWS Score  MEWS Temp 0  MEWS Systolic 2  MEWS Pulse 0  MEWS RR 0  MEWS LOC 0  MEWS Score 2  MEWS Score Color Yellow  Assess: if the MEWS score is Yellow or Red  Were vital signs taken at a resting state? Yes  Focused Assessment No change from prior assessment  Early Detection of Sepsis Score *See Row Information* Low  MEWS guidelines implemented *See Row Information* Yes  Treat  MEWS Interventions Escalated (See documentation below)  Pain Scale PAINAD  Pain Score 0  Breathing 0  Negative Vocalization 0  Facial Expression 0  Body Language 0  Consolability 0  PAINAD Score 0  Take Vital Signs  Increase Vital Sign Frequency  Yellow: Q 2hr X 2 then Q 4hr X 2, if remains yellow, continue Q 4hrs  Escalate  MEWS: Escalate Yellow: discuss with charge nurse/RN and consider discussing with provider and RRT  Notify: Charge Nurse/RN  Name of Charge Nurse/RN Notified [place], RN  Date Charge Nurse/RN Notified 03/21/21  Time Charge Nurse/RN Notified 2045  Notify: Provider  Provider Name/Title Dr. Clearence Ped  Date Provider Notified 03/21/21  Time Provider Notified 2044  Notification Type Page  Notification Reason Other (Comment) (bp 80/46)  Provider response See new orders  Date of Provider Response 03/21/21  Time of Provider Response 2140

## 2021-04-19 DEATH — deceased

## 2021-05-21 ENCOUNTER — Inpatient Hospital Stay: Payer: Medicare Other | Admitting: Adult Health
